# Patient Record
Sex: Female | Born: 1960
Health system: Southern US, Community
[De-identification: ages and names within clinical notes are randomized; demographics above are authoritative.]

## PROBLEM LIST (undated history)

## (undated) ENCOUNTER — Ambulatory Visit: Admission: EM | Payer: 59

## (undated) DIAGNOSIS — M62838 Other muscle spasm: Secondary | ICD-10-CM

## (undated) DIAGNOSIS — I890 Lymphedema, not elsewhere classified: Secondary | ICD-10-CM

## (undated) DIAGNOSIS — R519 Headache, unspecified: Secondary | ICD-10-CM

## (undated) DIAGNOSIS — K219 Gastro-esophageal reflux disease without esophagitis: Secondary | ICD-10-CM

## (undated) DIAGNOSIS — G43909 Migraine, unspecified, not intractable, without status migrainosus: Secondary | ICD-10-CM

## (undated) DIAGNOSIS — I10 Essential (primary) hypertension: Secondary | ICD-10-CM

## (undated) DIAGNOSIS — Z8616 Personal history of COVID-19: Secondary | ICD-10-CM

## (undated) DIAGNOSIS — C801 Malignant (primary) neoplasm, unspecified: Secondary | ICD-10-CM

## (undated) DIAGNOSIS — R109 Unspecified abdominal pain: Secondary | ICD-10-CM

## (undated) DIAGNOSIS — IMO0002 Reserved for concepts with insufficient information to code with codable children: Secondary | ICD-10-CM

## (undated) DIAGNOSIS — Z923 Personal history of irradiation: Secondary | ICD-10-CM

## (undated) DIAGNOSIS — F419 Anxiety disorder, unspecified: Secondary | ICD-10-CM

## (undated) DIAGNOSIS — R51 Headache: Secondary | ICD-10-CM

## (undated) DIAGNOSIS — M797 Fibromyalgia: Secondary | ICD-10-CM

## (undated) DIAGNOSIS — G8929 Other chronic pain: Secondary | ICD-10-CM

## (undated) DIAGNOSIS — M1990 Unspecified osteoarthritis, unspecified site: Secondary | ICD-10-CM

## (undated) DIAGNOSIS — M940 Chondrocostal junction syndrome [Tietze]: Secondary | ICD-10-CM

## (undated) DIAGNOSIS — R5383 Other fatigue: Secondary | ICD-10-CM

## (undated) DIAGNOSIS — G4730 Sleep apnea, unspecified: Secondary | ICD-10-CM

## (undated) HISTORY — DX: Migraine, unspecified, not intractable, without status migrainosus: G43.909

## (undated) HISTORY — DX: Other fatigue: R53.83

## (undated) HISTORY — DX: Other chronic pain: G89.29

## (undated) HISTORY — DX: Personal history of irradiation: Z92.3

## (undated) HISTORY — DX: Fibromyalgia: M79.7

## (undated) HISTORY — PX: BREAST LUMPECTOMY: SHX2

## (undated) HISTORY — DX: Essential (primary) hypertension: I10

## (undated) HISTORY — DX: Gastro-esophageal reflux disease without esophagitis: K21.9

## (undated) HISTORY — DX: Headache: R51

## (undated) HISTORY — DX: Headache, unspecified: R51.9

## (undated) HISTORY — PX: KNEE ARTHROSCOPY: SUR90

## (undated) HISTORY — DX: Personal history of COVID-19: Z86.16

## (undated) HISTORY — DX: Anxiety disorder, unspecified: F41.9

## (undated) HISTORY — DX: Unspecified abdominal pain: R10.9

## (undated) HISTORY — DX: Reserved for concepts with insufficient information to code with codable children: IMO0002

## (undated) HISTORY — PX: ABDOMINAL HYSTERECTOMY: SHX81

## (undated) HISTORY — DX: Other muscle spasm: M62.838

---

## 1997-08-12 ENCOUNTER — Other Ambulatory Visit: Admission: RE | Admit: 1997-08-12 | Discharge: 1997-08-12 | Payer: Self-pay | Admitting: Internal Medicine

## 1999-07-07 ENCOUNTER — Other Ambulatory Visit: Admission: RE | Admit: 1999-07-07 | Discharge: 1999-07-07 | Payer: Self-pay | Admitting: Internal Medicine

## 1999-07-26 ENCOUNTER — Ambulatory Visit (HOSPITAL_COMMUNITY): Admission: RE | Admit: 1999-07-26 | Discharge: 1999-07-26 | Payer: Self-pay | Admitting: Internal Medicine

## 1999-07-26 ENCOUNTER — Encounter: Payer: Self-pay | Admitting: Internal Medicine

## 2000-01-03 ENCOUNTER — Encounter: Payer: Self-pay | Admitting: Internal Medicine

## 2000-01-03 ENCOUNTER — Ambulatory Visit (HOSPITAL_COMMUNITY): Admission: RE | Admit: 2000-01-03 | Discharge: 2000-01-03 | Payer: Self-pay | Admitting: Internal Medicine

## 2000-01-24 ENCOUNTER — Encounter: Admission: RE | Admit: 2000-01-24 | Discharge: 2000-04-23 | Payer: Self-pay | Admitting: Internal Medicine

## 2000-03-12 ENCOUNTER — Other Ambulatory Visit: Admission: RE | Admit: 2000-03-12 | Discharge: 2000-03-12 | Payer: Self-pay | Admitting: Internal Medicine

## 2000-06-11 ENCOUNTER — Emergency Department (HOSPITAL_COMMUNITY): Admission: EM | Admit: 2000-06-11 | Discharge: 2000-06-11 | Payer: Self-pay | Admitting: Emergency Medicine

## 2001-05-09 ENCOUNTER — Other Ambulatory Visit: Admission: RE | Admit: 2001-05-09 | Discharge: 2001-05-09 | Payer: Self-pay | Admitting: Internal Medicine

## 2001-10-16 ENCOUNTER — Ambulatory Visit (HOSPITAL_COMMUNITY): Admission: RE | Admit: 2001-10-16 | Discharge: 2001-10-16 | Payer: Self-pay | Admitting: Internal Medicine

## 2001-10-16 ENCOUNTER — Encounter: Payer: Self-pay | Admitting: Internal Medicine

## 2001-10-30 ENCOUNTER — Encounter: Payer: Self-pay | Admitting: Internal Medicine

## 2001-10-30 ENCOUNTER — Encounter: Admission: RE | Admit: 2001-10-30 | Discharge: 2001-10-30 | Payer: Self-pay | Admitting: Internal Medicine

## 2002-03-18 ENCOUNTER — Encounter: Payer: Self-pay | Admitting: Obstetrics

## 2002-03-18 ENCOUNTER — Ambulatory Visit (HOSPITAL_COMMUNITY): Admission: RE | Admit: 2002-03-18 | Discharge: 2002-03-18 | Payer: Self-pay | Admitting: Obstetrics

## 2002-06-05 ENCOUNTER — Encounter (INDEPENDENT_AMBULATORY_CARE_PROVIDER_SITE_OTHER): Payer: Self-pay | Admitting: Specialist

## 2002-06-05 ENCOUNTER — Encounter (INDEPENDENT_AMBULATORY_CARE_PROVIDER_SITE_OTHER): Payer: Self-pay

## 2002-06-05 ENCOUNTER — Inpatient Hospital Stay (HOSPITAL_COMMUNITY): Admission: RE | Admit: 2002-06-05 | Discharge: 2002-06-11 | Payer: Self-pay | Admitting: Obstetrics & Gynecology

## 2002-06-08 ENCOUNTER — Encounter: Payer: Self-pay | Admitting: Obstetrics

## 2003-12-31 ENCOUNTER — Ambulatory Visit (HOSPITAL_COMMUNITY): Admission: RE | Admit: 2003-12-31 | Discharge: 2003-12-31 | Payer: Self-pay | Admitting: Internal Medicine

## 2005-03-25 ENCOUNTER — Emergency Department (HOSPITAL_COMMUNITY): Admission: EM | Admit: 2005-03-25 | Discharge: 2005-03-25 | Payer: Self-pay | Admitting: *Deleted

## 2005-09-13 ENCOUNTER — Ambulatory Visit (HOSPITAL_COMMUNITY): Admission: RE | Admit: 2005-09-13 | Discharge: 2005-09-13 | Payer: Self-pay | Admitting: Internal Medicine

## 2007-03-12 ENCOUNTER — Ambulatory Visit (HOSPITAL_COMMUNITY): Admission: RE | Admit: 2007-03-12 | Discharge: 2007-03-12 | Payer: Self-pay | Admitting: Internal Medicine

## 2007-03-28 ENCOUNTER — Emergency Department (HOSPITAL_COMMUNITY): Admission: EM | Admit: 2007-03-28 | Discharge: 2007-03-28 | Payer: Self-pay | Admitting: Emergency Medicine

## 2007-05-02 ENCOUNTER — Encounter: Admission: RE | Admit: 2007-05-02 | Discharge: 2007-05-02 | Payer: Self-pay | Admitting: Internal Medicine

## 2007-05-27 ENCOUNTER — Encounter: Admission: RE | Admit: 2007-05-27 | Discharge: 2007-05-27 | Payer: Self-pay | Admitting: Internal Medicine

## 2007-05-27 ENCOUNTER — Encounter (INDEPENDENT_AMBULATORY_CARE_PROVIDER_SITE_OTHER): Payer: Self-pay | Admitting: Radiology

## 2007-06-04 ENCOUNTER — Encounter: Admission: RE | Admit: 2007-06-04 | Discharge: 2007-06-04 | Payer: Self-pay | Admitting: Internal Medicine

## 2007-06-20 ENCOUNTER — Ambulatory Visit (HOSPITAL_BASED_OUTPATIENT_CLINIC_OR_DEPARTMENT_OTHER): Admission: RE | Admit: 2007-06-20 | Discharge: 2007-06-20 | Payer: Self-pay | Admitting: General Surgery

## 2007-06-20 ENCOUNTER — Encounter: Admission: RE | Admit: 2007-06-20 | Discharge: 2007-06-20 | Payer: Self-pay | Admitting: General Surgery

## 2007-06-20 ENCOUNTER — Encounter (INDEPENDENT_AMBULATORY_CARE_PROVIDER_SITE_OTHER): Payer: Self-pay | Admitting: General Surgery

## 2007-06-24 ENCOUNTER — Ambulatory Visit: Payer: Self-pay | Admitting: Oncology

## 2007-06-27 ENCOUNTER — Ambulatory Visit: Payer: Self-pay | Admitting: Oncology

## 2007-07-09 LAB — COMPREHENSIVE METABOLIC PANEL
ALT: 13 U/L (ref 0–35)
AST: 18 U/L (ref 0–37)
Albumin: 4 g/dL (ref 3.5–5.2)
Alkaline Phosphatase: 65 U/L (ref 39–117)
BUN: 9 mg/dL (ref 6–23)
CO2: 24 mEq/L (ref 19–32)
Calcium: 8.7 mg/dL (ref 8.4–10.5)
Chloride: 106 mEq/L (ref 96–112)
Creatinine, Ser: 0.67 mg/dL (ref 0.40–1.20)
Glucose, Bld: 96 mg/dL (ref 70–99)
Potassium: 4.2 mEq/L (ref 3.5–5.3)
Sodium: 140 mEq/L (ref 135–145)
Total Bilirubin: 0.3 mg/dL (ref 0.3–1.2)
Total Protein: 7.1 g/dL (ref 6.0–8.3)

## 2007-07-09 LAB — CBC WITH DIFFERENTIAL/PLATELET
BASO%: 0.4 % (ref 0.0–2.0)
Basophils Absolute: 0 10*3/uL (ref 0.0–0.1)
EOS%: 0.9 % (ref 0.0–7.0)
Eosinophils Absolute: 0.1 10*3/uL (ref 0.0–0.5)
HCT: 40.5 % (ref 34.8–46.6)
HGB: 13.5 g/dL (ref 11.6–15.9)
LYMPH%: 25.5 % (ref 14.0–48.0)
MCH: 28.2 pg (ref 26.0–34.0)
MCHC: 33.4 g/dL (ref 32.0–36.0)
MCV: 84.5 fL (ref 81.0–101.0)
MONO#: 0.5 10*3/uL (ref 0.1–0.9)
MONO%: 5.5 % (ref 0.0–13.0)
NEUT#: 6.4 10*3/uL (ref 1.5–6.5)
NEUT%: 67.7 % (ref 39.6–76.8)
Platelets: 296 10*3/uL (ref 145–400)
RBC: 4.8 10*6/uL (ref 3.70–5.32)
RDW: 13 % (ref 11.3–14.5)
WBC: 9.4 10*3/uL (ref 3.9–10.0)
lymph#: 2.4 10*3/uL (ref 0.9–3.3)

## 2007-07-09 LAB — LACTATE DEHYDROGENASE: LDH: 153 U/L (ref 94–250)

## 2007-07-09 LAB — FOLLICLE STIMULATING HORMONE: FSH: 7.6 m[IU]/mL

## 2007-07-09 LAB — CANCER ANTIGEN 27.29: CA 27.29: 19 U/mL (ref 0–39)

## 2007-07-09 LAB — LUTEINIZING HORMONE: LH: 2.8 m[IU]/mL

## 2007-07-10 LAB — VITAMIN D 25 HYDROXY (VIT D DEFICIENCY, FRACTURES): Vit D, 25-Hydroxy: 8 ng/mL — ABNORMAL LOW (ref 30–89)

## 2007-07-14 LAB — VITAMIN D 1,25 DIHYDROXY: Vit D, 1,25-Dihydroxy: 34 pg/mL (ref 6–62)

## 2007-07-15 ENCOUNTER — Encounter: Admission: RE | Admit: 2007-07-15 | Discharge: 2007-07-15 | Payer: Self-pay | Admitting: Oncology

## 2007-07-21 ENCOUNTER — Other Ambulatory Visit (INDEPENDENT_AMBULATORY_CARE_PROVIDER_SITE_OTHER): Payer: Self-pay | Admitting: General Surgery

## 2007-07-21 ENCOUNTER — Ambulatory Visit (HOSPITAL_BASED_OUTPATIENT_CLINIC_OR_DEPARTMENT_OTHER): Admission: RE | Admit: 2007-07-21 | Discharge: 2007-07-21 | Payer: Self-pay | Admitting: General Surgery

## 2007-07-21 ENCOUNTER — Encounter (INDEPENDENT_AMBULATORY_CARE_PROVIDER_SITE_OTHER): Payer: Self-pay | Admitting: General Surgery

## 2007-07-25 ENCOUNTER — Ambulatory Visit: Admission: RE | Admit: 2007-07-25 | Discharge: 2007-10-22 | Payer: Self-pay | Admitting: Radiation Oncology

## 2007-07-30 LAB — ESTRADIOL, ULTRA SENS: Estradiol, Ultra Sensitive: 47 pg/mL

## 2007-10-16 ENCOUNTER — Ambulatory Visit: Payer: Self-pay | Admitting: Oncology

## 2007-11-19 ENCOUNTER — Ambulatory Visit: Admission: RE | Admit: 2007-11-19 | Discharge: 2007-11-19 | Payer: Self-pay | Admitting: Radiation Oncology

## 2007-12-19 ENCOUNTER — Ambulatory Visit: Payer: Self-pay | Admitting: Oncology

## 2007-12-24 LAB — COMPREHENSIVE METABOLIC PANEL
ALT: 11 U/L (ref 0–35)
AST: 12 U/L (ref 0–37)
Albumin: 3.8 g/dL (ref 3.5–5.2)
Alkaline Phosphatase: 59 U/L (ref 39–117)
BUN: 15 mg/dL (ref 6–23)
CO2: 25 mEq/L (ref 19–32)
Calcium: 8.7 mg/dL (ref 8.4–10.5)
Chloride: 104 mEq/L (ref 96–112)
Creatinine, Ser: 0.98 mg/dL (ref 0.40–1.20)
Glucose, Bld: 99 mg/dL (ref 70–99)
Potassium: 3.7 mEq/L (ref 3.5–5.3)
Sodium: 136 mEq/L (ref 135–145)
Total Bilirubin: 0.3 mg/dL (ref 0.3–1.2)
Total Protein: 6.8 g/dL (ref 6.0–8.3)

## 2007-12-24 LAB — CBC WITH DIFFERENTIAL/PLATELET
BASO%: 0.4 % (ref 0.0–2.0)
Basophils Absolute: 0 10*3/uL (ref 0.0–0.1)
EOS%: 0.4 % (ref 0.0–7.0)
Eosinophils Absolute: 0 10*3/uL (ref 0.0–0.5)
HCT: 37.9 % (ref 34.8–46.6)
HGB: 12.9 g/dL (ref 11.6–15.9)
LYMPH%: 20.5 % (ref 14.0–48.0)
MCH: 29.1 pg (ref 26.0–34.0)
MCHC: 33.9 g/dL (ref 32.0–36.0)
MCV: 85.7 fL (ref 81.0–101.0)
MONO#: 0.5 10*3/uL (ref 0.1–0.9)
MONO%: 6.6 % (ref 0.0–13.0)
NEUT#: 5.8 10*3/uL (ref 1.5–6.5)
NEUT%: 72.1 % (ref 39.6–76.8)
Platelets: 249 10*3/uL (ref 145–400)
RBC: 4.42 10*6/uL (ref 3.70–5.32)
RDW: 12.9 % (ref 11.3–14.5)
WBC: 8 10*3/uL (ref 3.9–10.0)
lymph#: 1.6 10*3/uL (ref 0.9–3.3)

## 2007-12-24 LAB — CANCER ANTIGEN 27.29: CA 27.29: 16 U/mL (ref 0–39)

## 2008-03-15 ENCOUNTER — Encounter: Admission: RE | Admit: 2008-03-15 | Discharge: 2008-03-15 | Payer: Self-pay | Admitting: Internal Medicine

## 2008-04-23 DIAGNOSIS — Z923 Personal history of irradiation: Secondary | ICD-10-CM

## 2008-04-23 HISTORY — DX: Personal history of irradiation: Z92.3

## 2008-06-30 ENCOUNTER — Ambulatory Visit: Payer: Self-pay | Admitting: Oncology

## 2008-07-06 LAB — CBC WITH DIFFERENTIAL/PLATELET
BASO%: 0.4 % (ref 0.0–2.0)
Basophils Absolute: 0 10*3/uL (ref 0.0–0.1)
EOS%: 0.3 % (ref 0.0–7.0)
Eosinophils Absolute: 0 10*3/uL (ref 0.0–0.5)
HCT: 38.9 % (ref 34.8–46.6)
HGB: 13.1 g/dL (ref 11.6–15.9)
LYMPH%: 24.3 % (ref 14.0–49.7)
MCH: 29 pg (ref 25.1–34.0)
MCHC: 33.6 g/dL (ref 31.5–36.0)
MCV: 86.2 fL (ref 79.5–101.0)
MONO#: 0.3 10*3/uL (ref 0.1–0.9)
MONO%: 4.4 % (ref 0.0–14.0)
NEUT#: 5.5 10*3/uL (ref 1.5–6.5)
NEUT%: 70.6 % (ref 38.4–76.8)
Platelets: 226 10*3/uL (ref 145–400)
RBC: 4.51 10*6/uL (ref 3.70–5.45)
RDW: 13 % (ref 11.2–14.5)
WBC: 7.8 10*3/uL (ref 3.9–10.3)
lymph#: 1.9 10*3/uL (ref 0.9–3.3)

## 2008-07-06 LAB — COMPREHENSIVE METABOLIC PANEL
ALT: 17 U/L (ref 0–35)
AST: 20 U/L (ref 0–37)
Albumin: 3.6 g/dL (ref 3.5–5.2)
Alkaline Phosphatase: 47 U/L (ref 39–117)
BUN: 16 mg/dL (ref 6–23)
CO2: 26 mEq/L (ref 19–32)
Calcium: 8.4 mg/dL (ref 8.4–10.5)
Chloride: 107 mEq/L (ref 96–112)
Creatinine, Ser: 0.73 mg/dL (ref 0.40–1.20)
Glucose, Bld: 92 mg/dL (ref 70–99)
Potassium: 3.8 mEq/L (ref 3.5–5.3)
Sodium: 139 mEq/L (ref 135–145)
Total Bilirubin: 0.4 mg/dL (ref 0.3–1.2)
Total Protein: 6.7 g/dL (ref 6.0–8.3)

## 2008-07-06 LAB — LACTATE DEHYDROGENASE: LDH: 126 U/L (ref 94–250)

## 2008-07-07 LAB — CANCER ANTIGEN 27.29: CA 27.29: 15 U/mL (ref 0–39)

## 2008-07-07 LAB — VITAMIN D 25 HYDROXY (VIT D DEFICIENCY, FRACTURES): Vit D, 25-Hydroxy: 25 ng/mL — ABNORMAL LOW (ref 30–89)

## 2008-08-27 ENCOUNTER — Ambulatory Visit: Payer: Self-pay | Admitting: Oncology

## 2008-09-16 ENCOUNTER — Emergency Department (HOSPITAL_COMMUNITY): Admission: EM | Admit: 2008-09-16 | Discharge: 2008-09-16 | Payer: Self-pay | Admitting: Family Medicine

## 2008-10-26 ENCOUNTER — Ambulatory Visit: Payer: Self-pay | Admitting: Genetic Counselor

## 2008-12-03 ENCOUNTER — Ambulatory Visit: Payer: Self-pay | Admitting: Genetic Counselor

## 2008-12-30 ENCOUNTER — Ambulatory Visit: Payer: Self-pay | Admitting: Oncology

## 2009-03-28 ENCOUNTER — Ambulatory Visit: Payer: Self-pay | Admitting: Oncology

## 2009-04-20 ENCOUNTER — Encounter: Admission: RE | Admit: 2009-04-20 | Discharge: 2009-04-20 | Payer: Self-pay | Admitting: Oncology

## 2009-04-27 ENCOUNTER — Ambulatory Visit: Payer: Self-pay | Admitting: Oncology

## 2009-04-29 LAB — COMPREHENSIVE METABOLIC PANEL
ALT: 14 U/L (ref 0–35)
AST: 14 U/L (ref 0–37)
Albumin: 3.8 g/dL (ref 3.5–5.2)
Alkaline Phosphatase: 52 U/L (ref 39–117)
BUN: 16 mg/dL (ref 6–23)
CO2: 21 mEq/L (ref 19–32)
Calcium: 8.8 mg/dL (ref 8.4–10.5)
Chloride: 106 mEq/L (ref 96–112)
Creatinine, Ser: 0.66 mg/dL (ref 0.40–1.20)
Glucose, Bld: 99 mg/dL (ref 70–99)
Potassium: 4.1 mEq/L (ref 3.5–5.3)
Sodium: 139 mEq/L (ref 135–145)
Total Bilirubin: 0.2 mg/dL — ABNORMAL LOW (ref 0.3–1.2)
Total Protein: 6.6 g/dL (ref 6.0–8.3)

## 2009-04-29 LAB — CBC WITH DIFFERENTIAL/PLATELET
BASO%: 0.8 % (ref 0.0–2.0)
Basophils Absolute: 0.1 10*3/uL (ref 0.0–0.1)
EOS%: 0.7 % (ref 0.0–7.0)
Eosinophils Absolute: 0.1 10*3/uL (ref 0.0–0.5)
HCT: 38 % (ref 34.8–46.6)
HGB: 12.7 g/dL (ref 11.6–15.9)
LYMPH%: 24.8 % (ref 14.0–49.7)
MCH: 29 pg (ref 25.1–34.0)
MCHC: 33.3 g/dL (ref 31.5–36.0)
MCV: 87 fL (ref 79.5–101.0)
MONO#: 0.5 10*3/uL (ref 0.1–0.9)
MONO%: 6.1 % (ref 0.0–14.0)
NEUT#: 5.9 10*3/uL (ref 1.5–6.5)
NEUT%: 67.6 % (ref 38.4–76.8)
Platelets: 229 10*3/uL (ref 145–400)
RBC: 4.37 10*6/uL (ref 3.70–5.45)
RDW: 13.3 % (ref 11.2–14.5)
WBC: 8.7 10*3/uL (ref 3.9–10.3)
lymph#: 2.2 10*3/uL (ref 0.9–3.3)

## 2009-04-29 LAB — CANCER ANTIGEN 27.29: CA 27.29: 17 U/mL (ref 0–39)

## 2009-04-29 LAB — VITAMIN D 25 HYDROXY (VIT D DEFICIENCY, FRACTURES): Vit D, 25-Hydroxy: 35 ng/mL (ref 30–89)

## 2009-04-29 LAB — LACTATE DEHYDROGENASE: LDH: 150 U/L (ref 94–250)

## 2009-06-20 ENCOUNTER — Encounter: Admission: RE | Admit: 2009-06-20 | Discharge: 2009-06-20 | Payer: Self-pay | Admitting: General Surgery

## 2009-10-18 ENCOUNTER — Ambulatory Visit: Payer: Self-pay | Admitting: Oncology

## 2009-10-20 LAB — COMPREHENSIVE METABOLIC PANEL
ALT: 21 U/L (ref 0–35)
AST: 19 U/L (ref 0–37)
Albumin: 3.2 g/dL — ABNORMAL LOW (ref 3.5–5.2)
Alkaline Phosphatase: 52 U/L (ref 39–117)
BUN: 6 mg/dL (ref 6–23)
CO2: 26 mEq/L (ref 19–32)
Calcium: 8.3 mg/dL — ABNORMAL LOW (ref 8.4–10.5)
Chloride: 108 mEq/L (ref 96–112)
Creatinine, Ser: 0.67 mg/dL (ref 0.40–1.20)
Glucose, Bld: 101 mg/dL — ABNORMAL HIGH (ref 70–99)
Potassium: 3.4 mEq/L — ABNORMAL LOW (ref 3.5–5.3)
Sodium: 139 mEq/L (ref 135–145)
Total Bilirubin: 0.3 mg/dL (ref 0.3–1.2)
Total Protein: 6.1 g/dL (ref 6.0–8.3)

## 2009-10-20 LAB — CBC WITH DIFFERENTIAL/PLATELET
BASO%: 0.4 % (ref 0.0–2.0)
Basophils Absolute: 0 10*3/uL (ref 0.0–0.1)
EOS%: 0.7 % (ref 0.0–7.0)
Eosinophils Absolute: 0.1 10*3/uL (ref 0.0–0.5)
HCT: 39.8 % (ref 34.8–46.6)
HGB: 13.1 g/dL (ref 11.6–15.9)
LYMPH%: 26.9 % (ref 14.0–49.7)
MCH: 28.6 pg (ref 25.1–34.0)
MCHC: 32.8 g/dL (ref 31.5–36.0)
MCV: 87.2 fL (ref 79.5–101.0)
MONO#: 0.5 10*3/uL (ref 0.1–0.9)
MONO%: 6.3 % (ref 0.0–14.0)
NEUT#: 5 10*3/uL (ref 1.5–6.5)
NEUT%: 65.7 % (ref 38.4–76.8)
Platelets: 257 10*3/uL (ref 145–400)
RBC: 4.57 10*6/uL (ref 3.70–5.45)
RDW: 13.6 % (ref 11.2–14.5)
WBC: 7.6 10*3/uL (ref 3.9–10.3)
lymph#: 2 10*3/uL (ref 0.9–3.3)

## 2009-10-20 LAB — CANCER ANTIGEN 27.29: CA 27.29: 20 U/mL (ref 0–39)

## 2009-10-20 LAB — FOLLICLE STIMULATING HORMONE: FSH: 4.1 m[IU]/mL

## 2009-10-20 LAB — LACTATE DEHYDROGENASE: LDH: 137 U/L (ref 94–250)

## 2009-10-31 LAB — ESTRADIOL, ULTRA SENS: Estradiol, Ultra Sensitive: 217 pg/mL

## 2009-11-17 ENCOUNTER — Ambulatory Visit: Payer: Self-pay | Admitting: Oncology

## 2010-04-28 ENCOUNTER — Encounter
Admission: RE | Admit: 2010-04-28 | Discharge: 2010-04-28 | Payer: Self-pay | Source: Home / Self Care | Attending: Oncology | Admitting: Oncology

## 2010-05-01 ENCOUNTER — Encounter
Admission: RE | Admit: 2010-05-01 | Discharge: 2010-05-01 | Payer: Self-pay | Source: Home / Self Care | Attending: Oncology | Admitting: Oncology

## 2010-05-13 ENCOUNTER — Encounter: Payer: Self-pay | Admitting: Internal Medicine

## 2010-05-14 ENCOUNTER — Encounter: Payer: Self-pay | Admitting: Internal Medicine

## 2010-05-15 ENCOUNTER — Other Ambulatory Visit: Payer: Self-pay | Admitting: General Surgery

## 2010-05-15 DIAGNOSIS — N6459 Other signs and symptoms in breast: Secondary | ICD-10-CM

## 2010-05-23 ENCOUNTER — Encounter
Admission: RE | Admit: 2010-05-23 | Discharge: 2010-05-23 | Payer: Self-pay | Source: Home / Self Care | Attending: General Surgery | Admitting: General Surgery

## 2010-05-23 LAB — DIFFERENTIAL
Basophils Absolute: 0 10*3/uL (ref 0.0–0.1)
Basophils Relative: 0 % (ref 0–1)
Eosinophils Absolute: 0.1 10*3/uL (ref 0.0–0.7)
Eosinophils Relative: 1 % (ref 0–5)
Lymphocytes Relative: 34 % (ref 12–46)
Lymphs Abs: 2.5 10*3/uL (ref 0.7–4.0)
Monocytes Absolute: 0.5 10*3/uL (ref 0.1–1.0)
Monocytes Relative: 7 % (ref 3–12)
Neutro Abs: 4.3 10*3/uL (ref 1.7–7.7)
Neutrophils Relative %: 58 % (ref 43–77)

## 2010-05-23 LAB — URINALYSIS, ROUTINE W REFLEX MICROSCOPIC
Bilirubin Urine: NEGATIVE
Hgb urine dipstick: NEGATIVE
Ketones, ur: NEGATIVE mg/dL
Nitrite: NEGATIVE
Protein, ur: NEGATIVE mg/dL
Specific Gravity, Urine: 1.021 (ref 1.005–1.030)
Urine Glucose, Fasting: NEGATIVE mg/dL
Urobilinogen, UA: 1 mg/dL (ref 0.0–1.0)
pH: 6 (ref 5.0–8.0)

## 2010-05-23 LAB — COMPREHENSIVE METABOLIC PANEL
ALT: <8 U/L (ref 0–35)
AST: 18 U/L (ref 0–37)
Albumin: 3.2 g/dL — ABNORMAL LOW (ref 3.5–5.2)
Alkaline Phosphatase: 48 U/L (ref 39–117)
BUN: 7 mg/dL (ref 6–23)
CO2: 27 mEq/L (ref 19–32)
Calcium: 8.8 mg/dL (ref 8.4–10.5)
Chloride: 105 mEq/L (ref 96–112)
Creatinine, Ser: 0.74 mg/dL (ref 0.4–1.2)
GFR calc Af Amer: >60 mL/min (ref >60)
GFR calc non Af Amer: >60 mL/min (ref >60)
Glucose, Bld: 97 mg/dL (ref 70–99)
Potassium: 4.3 mEq/L (ref 3.5–5.1)
Sodium: 137 mEq/L (ref 135–145)
Total Bilirubin: 0.3 mg/dL (ref 0.3–1.2)
Total Protein: 6.7 g/dL (ref 6.0–8.3)

## 2010-05-23 LAB — CBC
HCT: 39 % (ref 36.0–46.0)
Hemoglobin: 12.6 g/dL (ref 12.0–15.0)
MCH: 27.8 pg (ref 26.0–34.0)
MCHC: 32.3 g/dL (ref 30.0–36.0)
MCV: 86.1 fL (ref 78.0–100.0)
Platelets: 272 10*3/uL (ref 150–400)
RBC: 4.53 MIL/uL (ref 3.87–5.11)
RDW: 12.9 % (ref 11.5–15.5)
WBC: 7.5 10*3/uL (ref 4.0–10.5)

## 2010-05-25 ENCOUNTER — Other Ambulatory Visit: Payer: Self-pay | Admitting: General Surgery

## 2010-05-25 DIAGNOSIS — R928 Other abnormal and inconclusive findings on diagnostic imaging of breast: Secondary | ICD-10-CM

## 2010-05-26 ENCOUNTER — Ambulatory Visit
Admission: RE | Admit: 2010-05-26 | Discharge: 2010-05-26 | Disposition: A | Discharge status: Home / Self Care | Payer: BC Managed Care – PPO | Source: Ambulatory Visit | Attending: General Surgery | Admitting: General Surgery

## 2010-05-26 ENCOUNTER — Other Ambulatory Visit: Payer: Self-pay | Admitting: General Surgery

## 2010-05-26 ENCOUNTER — Ambulatory Visit (HOSPITAL_BASED_OUTPATIENT_CLINIC_OR_DEPARTMENT_OTHER)
Admission: RE | Admit: 2010-05-26 | Discharge: 2010-05-26 | Disposition: A | Discharge status: Home / Self Care | Payer: BC Managed Care – PPO | Attending: General Surgery | Admitting: General Surgery

## 2010-05-26 DIAGNOSIS — N6459 Other signs and symptoms in breast: Secondary | ICD-10-CM

## 2010-05-26 DIAGNOSIS — N6089 Other benign mammary dysplasias of unspecified breast: Secondary | ICD-10-CM | POA: Insufficient documentation

## 2010-05-26 DIAGNOSIS — K219 Gastro-esophageal reflux disease without esophagitis: Secondary | ICD-10-CM | POA: Insufficient documentation

## 2010-05-26 DIAGNOSIS — G43909 Migraine, unspecified, not intractable, without status migrainosus: Secondary | ICD-10-CM | POA: Insufficient documentation

## 2010-05-26 DIAGNOSIS — R928 Other abnormal and inconclusive findings on diagnostic imaging of breast: Secondary | ICD-10-CM

## 2010-05-26 DIAGNOSIS — F17200 Nicotine dependence, unspecified, uncomplicated: Secondary | ICD-10-CM | POA: Insufficient documentation

## 2010-05-26 DIAGNOSIS — G4730 Sleep apnea, unspecified: Secondary | ICD-10-CM | POA: Insufficient documentation

## 2010-05-29 NOTE — Op Note (Signed)
Ashley Lindsey, Ashley Lindsey                ACCOUNT NO.:  [id number]  MEDICAL RECORD NO.:  [id number]          PATIENT TYPE:  AMB  LOCATION:  DSC                          FACILITY:  [facility]  PHYSICIAN:  Angelia Mould. Derrell Lolling, M.D.DATE OF BIRTH:  Oct 22, 1960  DATE OF PROCEDURE:  05/26/2010 DATE OF DISCHARGE:                              OPERATIVE REPORT   PREOPERATIVE DIAGNOSIS:  Atypical ductal hyperplasia, right breast, 6 o'clock position.  POSTOPERATIVE DIAGNOSIS:  Atypical ductal hyperplasia, right breast, 6 o'clock position.  OPERATION PERFORMED:  Right partial mastectomy with needle localization.  SURGEON:  Angelia Mould. Derrell Lolling, MD  OPERATIVE INDICATIONS:  This is a 50 year old African American female who has a history of left breast cancer.  She underwent left partial mastectomy in 2007 and left axillary sentinel node biopsy in 2009 for T1a, N0, IHC positive, receptor positive, Her-2 negative tumor.  She has no known local recurrence to the left breast.  She follows closely with Dr. Pierce Crane.  I last saw her 12 months ago.  She was on tamoxifen. Recent MRI in February 2011 looked fine.  Recent mammograms show what is thought to be a new area of microcalcifications at the 6 o'clock position of the right breast.  No other abnormalities were mentioned other the lumpectomy changes on the left.  Image-guided biopsy was performed showing atypical ductal hyperplasia with calcifications.  We have discussed this in the office.  She is aware that there could be sampling error and there might be ductal carcinoma in situ present.  She was advised to have this area completely excised to rule out occult carcinoma.  She is brought to the operating room electively.  OPERATIVE TECHNIQUE:  The patient underwent bracketed wire localization of a small area of calcifications in the 6 o'clock position of the right breast by Dr. Harmon Pier at the [facility] of [place].  The films looked good.  The  patient was taken to the operating room.  General anesthesia was induced.  The right breast was prepped and draped in a sterile fashion.  Intravenous antibiotics were given.  The patient was identified as the correct patient, correct procedure, and correct site.  I reviewed the imaging studies.  The wires appeared to be directed superolaterally and were entering about the 5 o'clock position.  I drew a very narrow radially-oriented incision at about the 5:30 position, which encompassed both wires.  This started just outside the areolar margin and stopped before we got to the inframammary crease.  After making this incision, dissection was carried down deeply into the breast tissue around these localizing wires.  The wires remained intact and were not seen as part of the dissection.  The specimen was removed and marked with the six-color margin marker kit.  This specimen mammogram was performed, Dr. Si Gaul stated that the calcifications were within the specimen.  They looked a little bit off to one side, but it could not be determined from the imaging studies which side that was, so we simply did no further dissection.  Specimen was sent to Pathology.  Hemostasis was excellent and achieved with electrocautery.  The breast  tissue was closed in several layers with interrupted sutures of 3-0 Vicryl and the skin closed with a running subcuticular suture of 4-0 Monocryl and Dermabond.  Ice pack was placed and the patient taken to the recovery room in a stable condition.  Estimated blood loss was about 10 mL.  Complications were none.  Sponge, needle, and instrument counts were correct.     Angelia Mould. Derrell Lolling, M.D.     HMI/MEDQ  D:  05/26/2010  T:  05/27/2010  Job:  [id number]  cc:   Pierce Crane, MD Merlene Laughter. Renae Gloss, M.D.  Electronically Signed by Claud Kelp M.D. on 05/29/2010 07:34:40 AM

## 2010-06-13 ENCOUNTER — Encounter (HOSPITAL_COMMUNITY)
Admission: RE | Admit: 2010-06-13 | Discharge: 2010-06-13 | Disposition: A | Discharge status: Home / Self Care | Payer: BC Managed Care – PPO | Source: Ambulatory Visit | Attending: General Surgery | Admitting: General Surgery

## 2010-06-13 DIAGNOSIS — Z01812 Encounter for preprocedural laboratory examination: Secondary | ICD-10-CM | POA: Insufficient documentation

## 2010-06-13 LAB — CBC
HCT: 38.9 % (ref 36.0–46.0)
Hemoglobin: 12.4 g/dL (ref 12.0–15.0)
MCH: 27 pg (ref 26.0–34.0)
MCHC: 31.9 g/dL (ref 30.0–36.0)
MCV: 84.6 fL (ref 78.0–100.0)
Platelets: 277 10*3/uL (ref 150–400)
RBC: 4.6 MIL/uL (ref 3.87–5.11)
RDW: 12.7 % (ref 11.5–15.5)
WBC: 7.9 10*3/uL (ref 4.0–10.5)

## 2010-06-13 LAB — BASIC METABOLIC PANEL
BUN: 8 mg/dL (ref 6–23)
CO2: 29 mEq/L (ref 19–32)
Calcium: 9.1 mg/dL (ref 8.4–10.5)
Chloride: 104 mEq/L (ref 96–112)
Creatinine, Ser: 0.75 mg/dL (ref 0.4–1.2)
GFR calc Af Amer: >60 mL/min (ref >60)
GFR calc non Af Amer: >60 mL/min (ref >60)
Glucose, Bld: 106 mg/dL — ABNORMAL HIGH (ref 70–99)
Potassium: 4.5 mEq/L (ref 3.5–5.1)
Sodium: 138 mEq/L (ref 135–145)

## 2010-06-13 LAB — SURGICAL PCR SCREEN
MRSA, PCR: NEGATIVE
Staphylococcus aureus: NEGATIVE

## 2010-06-16 ENCOUNTER — Other Ambulatory Visit: Payer: Self-pay | Admitting: General Surgery

## 2010-06-16 ENCOUNTER — Ambulatory Visit (HOSPITAL_COMMUNITY)
Admission: RE | Admit: 2010-06-16 | Discharge: 2010-06-16 | Disposition: A | Discharge status: Home / Self Care | Payer: BC Managed Care – PPO | Source: Ambulatory Visit | Attending: General Surgery | Admitting: General Surgery

## 2010-06-16 DIAGNOSIS — D0590 Unspecified type of carcinoma in situ of unspecified breast: Secondary | ICD-10-CM | POA: Insufficient documentation

## 2010-06-21 NOTE — Op Note (Signed)
NAMEMAEVYN, RIORDAN                ACCOUNT NO.:  [id number]  MEDICAL RECORD NO.:  [id number]           PATIENT TYPE:  O  LOCATION:  SDSC                         FACILITY:  [facility]  PHYSICIAN:  Angelia Mould. Derrell Lolling, M.D.DATE OF BIRTH:  1961/03/05  DATE OF PROCEDURE:  06/16/2010 DATE OF DISCHARGE:  06/16/2010                              OPERATIVE REPORT   PREOPERATIVE DIAGNOSIS:  Ductal carcinoma in situ, right breast, status post partial mastectomy with close superior margin.  POSTOPERATIVE DIAGNOSIS:  Ductal carcinoma in situ, right breast, status post partial mastectomy with close superior margin.  OPERATION PERFORMED:  Right partial mastectomy with re-excision of superior margin.  SURGEON:  Angelia Mould. Derrell Lolling, MD  OPERATIVE INDICATIONS:  This is a 50 year old African American female who has a history of left breast cancer in 2007, having undergone a left partial mastectomy and left axillary sentinel node biopsy for stage T1a, N0 (IHC positive), receptor positive, HER-2 negative tumor.  She did well following her adjuvant treatments.  She developed some microcalcifications in her right breast at the 6 o'clock position in the retroareolar area.  An image-guided biopsy showed atypical ductal hyperplasia.  A right partial mastectomy with bracketed needle localization was performed on May 26, 2010, and that showed ductal carcinoma in situ with a small tumor size but very close to the superior margin less than 1 mm from the superior margin.  I advised re-excision of this area.  She is brought to the operating room electively.  OPERATIVE TECHNIQUE:  Following the induction of a general LMA anesthetic, the patient's right breast was prepped and draped in a sterile fashion.  Intravenous antibiotics were given.  The patient was identified as the correct patient, correct procedure, and correct site. Marcaine 0.5% with epinephrine was used as a local infiltration anesthetic.  I  observed the radially oriented incision that was at about the 6:30 position.  It was healing uneventfully.  I made a very narrow ellipse around this incision to excise the old scar.  I then took the dissection laterally into the breast tissue.  I entered the seroma cavity at its most inferior part and left the inferior part of the cavity intact.  I then dissected around the seroma cavity superiorly, medially, and laterally, and somewhat posteriorly.  This went far back under the nipple-areolar complex but I was able to do this with good control of the tissues.  The specimen was removed and marked with the color margin marker kit.  The specimen was then sent to the lab with the appropriate history attached as a re-excision partial mastectomy.  The wound was irrigated with saline.  Hemostasis was excellent and achieved with electrocautery.  I rebuilt the breast tissues inferiorly by closing the breast tissues in two separate layers with interrupted sutures of 3-0 Vicryl.  The skin incision was then closed with a running subcuticular suture of 4-0 Monocryl and Dermabond.  Ice pack was placed and the patient taken to recovery room in stable condition.  Estimated blood loss was about 10-15 mL.  Complications none.  Sponge, needle, instrument counts were correct.     Mikey Bussing  Grayce Sessions, M.D.     HMI/MEDQ  D:  06/16/2010  T:  06/16/2010  Job:  [id number]  cc:   Pierce Crane, MD Billie Lade, M.D. Merlene Laughter. Renae Gloss, M.D.  Electronically Signed by Claud Kelp M.D. on 06/21/2010 01:18:19 PM

## 2010-06-23 ENCOUNTER — Other Ambulatory Visit: Payer: Self-pay | Admitting: Oncology

## 2010-06-23 ENCOUNTER — Encounter (HOSPITAL_BASED_OUTPATIENT_CLINIC_OR_DEPARTMENT_OTHER): Payer: Self-pay | Admitting: Oncology

## 2010-06-23 DIAGNOSIS — Z170 Estrogen receptor positive status [ER+]: Secondary | ICD-10-CM

## 2010-06-23 DIAGNOSIS — C50419 Malignant neoplasm of upper-outer quadrant of unspecified female breast: Secondary | ICD-10-CM

## 2010-06-23 LAB — CBC WITH DIFFERENTIAL/PLATELET
BASO%: 0.2 % (ref 0.0–2.0)
Basophils Absolute: 0 10*3/uL (ref 0.0–0.1)
EOS%: 1.8 % (ref 0.0–7.0)
Eosinophils Absolute: 0.1 10*3/uL (ref 0.0–0.5)
HCT: 37.4 % (ref 34.8–46.6)
HGB: 12.3 g/dL (ref 11.6–15.9)
LYMPH%: 43.1 % (ref 14.0–49.7)
MCH: 27.4 pg (ref 25.1–34.0)
MCHC: 32.7 g/dL (ref 31.5–36.0)
MCV: 83.8 fL (ref 79.5–101.0)
MONO#: 0.4 10*3/uL (ref 0.1–0.9)
MONO%: 7.7 % (ref 0.0–14.0)
NEUT#: 2.6 10*3/uL (ref 1.5–6.5)
NEUT%: 47.2 % (ref 38.4–76.8)
Platelets: 275 10*3/uL (ref 145–400)
RBC: 4.47 10*6/uL (ref 3.70–5.45)
RDW: 13.3 % (ref 11.2–14.5)
WBC: 5.6 10*3/uL (ref 3.9–10.3)
lymph#: 2.4 10*3/uL (ref 0.9–3.3)

## 2010-06-24 LAB — COMPREHENSIVE METABOLIC PANEL
ALT: 28 U/L (ref 0–35)
AST: 20 U/L (ref 0–37)
Albumin: 4 g/dL (ref 3.5–5.2)
Alkaline Phosphatase: 53 U/L (ref 39–117)
BUN: 16 mg/dL (ref 6–23)
CO2: 23 mEq/L (ref 19–32)
Calcium: 8.2 mg/dL — ABNORMAL LOW (ref 8.4–10.5)
Chloride: 106 mEq/L (ref 96–112)
Creatinine, Ser: 0.73 mg/dL (ref 0.40–1.20)
Glucose, Bld: 116 mg/dL — ABNORMAL HIGH (ref 70–99)
Potassium: 4 mEq/L (ref 3.5–5.3)
Sodium: 140 mEq/L (ref 135–145)
Total Bilirubin: 0.2 mg/dL — ABNORMAL LOW (ref 0.3–1.2)
Total Protein: 7.2 g/dL (ref 6.0–8.3)

## 2010-06-24 LAB — CANCER ANTIGEN 27.29: CA 27.29: 23 U/mL (ref 0–39)

## 2010-06-24 LAB — VITAMIN D 25 HYDROXY (VIT D DEFICIENCY, FRACTURES): Vit D, 25-Hydroxy: 55 ng/mL (ref 30–89)

## 2010-06-24 LAB — LACTATE DEHYDROGENASE: LDH: 154 U/L (ref 94–250)

## 2010-06-30 ENCOUNTER — Encounter: Payer: BC Managed Care – PPO | Admitting: Genetic Counselor

## 2010-07-24 ENCOUNTER — Ambulatory Visit: Payer: Self-pay | Attending: Radiation Oncology | Admitting: Radiation Oncology

## 2010-07-24 DIAGNOSIS — L538 Other specified erythematous conditions: Secondary | ICD-10-CM | POA: Insufficient documentation

## 2010-07-24 DIAGNOSIS — Z510 Encounter for antineoplastic radiation therapy: Secondary | ICD-10-CM | POA: Insufficient documentation

## 2010-07-24 DIAGNOSIS — C50419 Malignant neoplasm of upper-outer quadrant of unspecified female breast: Secondary | ICD-10-CM | POA: Insufficient documentation

## 2010-07-24 DIAGNOSIS — C50319 Malignant neoplasm of lower-inner quadrant of unspecified female breast: Secondary | ICD-10-CM | POA: Insufficient documentation

## 2010-07-27 ENCOUNTER — Other Ambulatory Visit: Payer: Self-pay | Admitting: Oncology

## 2010-07-27 ENCOUNTER — Encounter (HOSPITAL_BASED_OUTPATIENT_CLINIC_OR_DEPARTMENT_OTHER): Payer: Self-pay | Admitting: Oncology

## 2010-07-27 DIAGNOSIS — Z170 Estrogen receptor positive status [ER+]: Secondary | ICD-10-CM

## 2010-07-27 DIAGNOSIS — C50419 Malignant neoplasm of upper-outer quadrant of unspecified female breast: Secondary | ICD-10-CM

## 2010-07-27 DIAGNOSIS — Z853 Personal history of malignant neoplasm of breast: Secondary | ICD-10-CM

## 2010-07-27 LAB — CBC WITH DIFFERENTIAL/PLATELET
BASO%: 0.4 % (ref 0.0–2.0)
Basophils Absolute: 0 10*3/uL (ref 0.0–0.1)
EOS%: 0.9 % (ref 0.0–7.0)
Eosinophils Absolute: 0.1 10*3/uL (ref 0.0–0.5)
HCT: 39.4 % (ref 34.8–46.6)
HGB: 12.9 g/dL (ref 11.6–15.9)
LYMPH%: 30.3 % (ref 14.0–49.7)
MCH: 27.8 pg (ref 25.1–34.0)
MCHC: 32.6 g/dL (ref 31.5–36.0)
MCV: 85.3 fL (ref 79.5–101.0)
MONO#: 0.3 10*3/uL (ref 0.1–0.9)
MONO%: 5.3 % (ref 0.0–14.0)
NEUT#: 4.1 10*3/uL (ref 1.5–6.5)
NEUT%: 63.1 % (ref 38.4–76.8)
Platelets: 232 10*3/uL (ref 145–400)
RBC: 4.62 10*6/uL (ref 3.70–5.45)
RDW: 14.6 % — ABNORMAL HIGH (ref 11.2–14.5)
WBC: 6.6 10*3/uL (ref 3.9–10.3)
lymph#: 2 10*3/uL (ref 0.9–3.3)

## 2010-07-27 LAB — COMPREHENSIVE METABOLIC PANEL
ALT: <8 U/L (ref 0–35)
AST: 13 U/L (ref 0–37)
Albumin: 3.8 g/dL (ref 3.5–5.2)
Alkaline Phosphatase: 54 U/L (ref 39–117)
BUN: 12 mg/dL (ref 6–23)
CO2: 25 mEq/L (ref 19–32)
Calcium: 8.9 mg/dL (ref 8.4–10.5)
Chloride: 105 mEq/L (ref 96–112)
Creatinine, Ser: 0.84 mg/dL (ref 0.40–1.20)
Glucose, Bld: 105 mg/dL — ABNORMAL HIGH (ref 70–99)
Potassium: 4.4 mEq/L (ref 3.5–5.3)
Sodium: 138 mEq/L (ref 135–145)
Total Bilirubin: 0.2 mg/dL — ABNORMAL LOW (ref 0.3–1.2)
Total Protein: 6.8 g/dL (ref 6.0–8.3)

## 2010-08-01 LAB — POCT I-STAT, CHEM 8
BUN: 10 mg/dL (ref 6–23)
Calcium, Ion: 1.12 mmol/L (ref 1.12–1.32)
Chloride: 104 mEq/L (ref 96–112)
Creatinine, Ser: 0.8 mg/dL (ref 0.4–1.2)
Glucose, Bld: 95 mg/dL (ref 70–99)
HCT: 43 % (ref 36.0–46.0)
Hemoglobin: 14.6 g/dL (ref 12.0–15.0)
Potassium: 4.2 mEq/L (ref 3.5–5.1)
Sodium: 138 mEq/L (ref 135–145)
TCO2: 26 mmol/L (ref 0–100)

## 2010-08-07 ENCOUNTER — Encounter: Payer: Self-pay | Admitting: Genetic Counselor

## 2010-08-21 ENCOUNTER — Encounter: Payer: Self-pay | Admitting: Genetic Counselor

## 2010-09-05 NOTE — Op Note (Signed)
NAMEOLIVINE, Ashley Lindsey                ACCOUNT NO.:  [id number]   MEDICAL RECORD NO.:  [id number]          PATIENT TYPE:  AMB   LOCATION:  DSC                          FACILITY:  [facility]   PHYSICIAN:  Angelia Mould. Derrell Lolling, M.D.DATE OF BIRTH:  [birth year]   DATE OF PROCEDURE:  07/21/2007  DATE OF DISCHARGE:                               OPERATIVE REPORT   PREOPERATIVE DIAGNOSIS:  Invasive carcinoma left breast.   POSTOPERATIVE DIAGNOSIS:  Invasive carcinoma left breast.   OPERATION PERFORMED:  Left axillary sentinel lymph node biopsy,  injection of blue dye.   SURGEON:  Dr. Claud Kelp.   OPERATIVE INDICATIONS:  This is a 50 year old black female who was  recently evaluated because of some microcalcifications in the central  upper part of the left breast.  Core biopsy showed ductal carcinoma in  situ.  She underwent full evaluation including MRI and she was thought  to have localized ductal carcinoma in situ.  She underwent a localized  excision of this area with negative margins but there was a 4.5 mm focus  of invasive ductal carcinoma, tubular subtype identified within the  specimen.  I have discussed this with her.  The consensus view of Dr.  Donnie Coffin and I is that the standard of care would be to go ahead with a  left axillary sentinel node biopsy, recognizing that she is probably at  low risk for a lymph node metastasis due to the low grade nature of her  invasive cancer.  She is in full agreement with staging her axilla and  is brought to the operating room electively.   OPERATIVE TECHNIQUE:  The patient was brought to [facility] [employer].  The nuclear medicine technician injected  technetium sulfur colloid in the subareolar area.  The patient was taken  to the operating room.  The patient underwent a general LMA anesthetic.  Intravenous antibiotics were given.  Following an alcohol prep, I  injected 5 mL of blue dye in the subareolar area, this  consisted of 2 mL  of methylene blue mixed with 3 mL of saline.  The breast was massaged  for two or three minutes.  The left breast and left axilla were then  prepped and draped in a sterile fashion.  I used 0.5% Marcaine with  epinephrine as a local infiltration anesthetic.  Using the NeoProbe, we  identified the area of clearly increased activity in the left axilla.  A  transverse incision was made overlying this area which was lateral to  the lateral border of the pectoralis major muscle and just at the  hairline.  Dissection was carried down through the subcutaneous tissue  until we entered the axillary space.  We very quickly traced out a  single sentinel lymph node.  This was actually enlarged but palpably  soft.  It was very blue and very hot.  This was dissected away from the  surrounding tissues.  Dr. Berneta Levins performed imprint cytology.  She  called back to the operating room and told me that the imprint cytology  was negative  for cancer cells.  There were no other enlarged or hot or  blue lymph nodes apparent.  The wound was irrigated with saline.  Hemostasis was excellent.  The deep subcutaneous tissue was closed with  interrupted sutures of 3-0 Vicryl and skin closed with running  subcuticular suture of 4-0 Monocryl and Dermabond.  Clean bandages were  placed and the patient taken to the recovery room in stable condition.   ESTIMATED BLOOD LOSS:  About 10 mL or less.   COMPLICATIONS:  None.   Sponge, needle and instrument counts were correct.      Angelia Mould. Derrell Lolling, M.D.  Electronically Signed     HMI/MEDQ  D:  07/21/2007  T:  07/21/2007  Job:  [id number]   cc:   Merlene Laughter. Renae Gloss, M.D.  Pierce Crane, MD  Billie Lade, M.D.

## 2010-09-05 NOTE — Op Note (Signed)
NAMEVENNELA, Ashley Lindsey                ACCOUNT NO.:  [id number]   MEDICAL RECORD NO.:  [id number]          PATIENT TYPE:  AMB   LOCATION:  DSC                          FACILITY:  [facility]   PHYSICIAN:  Angelia Mould. Derrell Lolling, M.D.DATE OF BIRTH:  1961-04-02   DATE OF PROCEDURE:  06/20/2007  DATE OF DISCHARGE:                               OPERATIVE REPORT   PREOPERATIVE DIAGNOSIS:  Ductal carcinoma in situ, left breast.   POSTOPERATIVE DIAGNOSIS:  Ductal carcinoma in situ, left breast.   OPERATION PERFORMED:  Left partial mastectomy, with needle localization  and specimen mammogram.   SURGEON:  Angelia Mould. Derrell Lolling, M.D.   OPERATIVE INDICATIONS:  This is a 50 year old black female who had  routine screening mammograms.  Abnormal calcifications were noted in the  left breast anteriorly at the 12 o'clock position.  A stereotactic core  biopsy was performed by Dr. Anselmo Pickler, and a clip was placed.  The  pathology report showed a ductal carcinoma in situ.  She has had an MRI,  which showed this to be a solitary finding and was read as showing a 1.7  x 1.4 x 0.9 cm area of ductal carcinoma in situ at the 12 o'clock  position pf the left breast; with normal appearing lymph nodes.  The  patient was interested in breast conservation.  She is brought to the  operating room electively.   OPERATIVE TECHNIQUE:  The patient underwent needle localization at the  [facility] today, and that needle local wire was in  good position.  You could see the wire entering from the superior  location and directed posteriorly and slightly inferior, and going  through the calcifications and the marker clip.   The patient was then brought to the operating room.  General anesthesia  was induced.  The left breast was prepped and draped in a sterile  fashion.  The patient was identified as correct patient, correct  procedure and correct site.  I reviewed all the x-rays and put them up  in the operating  room for review.  Marcaine 0.5% with epinephrine was  used as a local infiltration anesthetic.   A curved transverse incision was made superiorly in the left breast,  about halfway between the areolar margin and the insertion site of the  wire.  Dissection was carried down into the breast, and with  electrocautery and sharp dissection I went widely around the localizing  wire.  The specimen was marked with the superficial, superior and  lateral margins.  Specimen mammogram was performed. and Dr. Kearney Hard called  and said that the marker clip was within the center of the specimen.  The Specimen was then sent for routine histology.  Hemostasis was  excellent and achieved with electrocautery.  The wound was irrigated  with saline.  I closed the deeper breast tissues with interrupted  sutures of 3-0 Vicryl.  I closed the superficial layers of breast tissue  with interrupted sutures of 3-0 Vicryl,  and I closed the skin with a  running subcuticular suture of 4-0 Monocryl and Dermabond.  Clean  bandages  were placed and the patient taken to recovery room in stable  condition.   ESTIMATED BLOOD LOSS:  About 20 mL or less.   COMPLICATIONS:  None.   COUNTS:  Sponge, needle and instrument counts were correct.      Angelia Mould. Derrell Lolling, M.D.  Electronically Signed     HMI/MEDQ  D:  06/20/2007  T:  06/21/2007  Job:  [id number]   cc:   Merlene Laughter. Renae Gloss, M.D.

## 2010-09-07 ENCOUNTER — Inpatient Hospital Stay: Admission: RE | Admit: 2010-09-07 | Payer: Self-pay | Source: Ambulatory Visit

## 2010-09-08 NOTE — Discharge Summary (Signed)
NAME:  Ashley Lindsey, Ashley Lindsey                          ACCOUNT NO.:  [id number]   MEDICAL RECORD NO.:  [id number]                   PATIENT TYPE:  INP   LOCATION:  9125                                 [facility]ACILITY[facility]   PHYSICIAN:  Roseanna Rainbow, M.D.         DATE OF BIRTH:  30-Oct-1960   DATE OF ADMISSION:  06/05/2002  DATE OF DISCHARGE:  06/11/2002                                 DISCHARGE SUMMARY   CHIEF COMPLAINT:  The patient is a 50 year old African-American female with  uterine fibroids and pelvic pain who presents for abdominal hysterectomy.   HISTORY OF PRESENT ILLNESS:  As above.  Previous work-up has included a  pelvic ultrasound that demonstrated multiple uterine fibroids and a  hydrosalpinx on the right.   ALLERGIES:  PENICILLIN with an anaphylactoid type reaction.   MEDICATIONS:  1. Fiorinal.  2. Ibuprofen.  3. Xanax.   SOCIAL HISTORY:  She denies any tobacco, ethanol, or substance abuse.   PAST MEDICAL HISTORY:  1. She has a history of migraines.  2. She has an anxiety disorder.   PAST OB/GYN HISTORY:  She is status post one NSVD.   PAST SURGICAL HISTORY:  She is status post right knee surgery.   PHYSICAL EXAMINATION:  VITAL SIGNS:  Stable, afebrile.  GENERAL:  Well-developed, well-nourished.  HEENT:  Normocephalic, atraumatic.  NECK:  Supple.  No thyromegaly.  LUNGS:  Clear to auscultation bilaterally.  HEART:  Regular rate and rhythm.  ABDOMEN:  Soft, nontender.  No organomegaly.  PELVIC:  On examination of the external female genitalia are normal  appearing.  On speculum examination the vagina is clean.  On bimanual  examination the uterus is anteverted, irregular in contour, approximately 12  weeks size, aggregate.  Adnexa nonpalpable, nontender bilaterally.  EXTREMITIES:  No clubbing, cyanosis, edema.   ASSESSMENT:  1. Uterine fibroids.  2. Right hydrosalpinx likely history of pelvic inflammatory disease.   PLAN:  Total abdominal hysterectomy  with right salpingectomy.   HOSPITAL COURSE:  The patient was admitted and underwent a total abdominal  hysterectomy, right salpingectomy, and left salpingo-oophorectomy with lysis  of adhesions.  Please see the dictated operative summary for further  details.  Her postoperative course was remarkable for anemia secondary to  blood loss of surgery with a hemoglobin of 8 on postoperative day number  three.  She also developed a mild ileus that responded to conservative  measures.  She was discharged to home on postoperative day number six  tolerating a regular diet.   DISCHARGE DIAGNOSES:  1. Uterine fibroids.  2. Endometriosis.   PROCEDURE:  1. Total abdominal hysterectomy.  2. Left salpingo-oophorectomy.  3. Right salpingectomy.  4. Lysis of adhesions.   CONDITION ON DISCHARGE:  Good.   DIET:  Regular.   ACTIVITY:  No strenuous activity.  No intercourse.   DISCHARGE MEDICATIONS:  1. Percocet one to two tablets p.o. q.i.d. p.r.n.  2. Ibuprofen 600 mg  one tablet p.o. q.i.d. p.r.n.  3. Niferex Forte one tablet p.o. b.i.d.  4. Colace one tablet p.o. b.i.d. p.r.n.   DISPOSITION:  The patient was to follow up in the office in one week for an  incision check.                                               Roseanna Rainbow, M.D.    Judee Clara  D:  06/11/2002  T:  06/11/2002  Job:  [id number]

## 2010-09-08 NOTE — Op Note (Signed)
NAME:  Ashley Lindsey, Ashley Lindsey                          ACCOUNT NO.:  [id number]   MEDICAL RECORD NO.:  [id number]                   PATIENT TYPE:  INP   LOCATION:  9125                                 FACILITY[facility]   PHYSICIAN:  Roseanna Rainbow, M.D.         DATE OF BIRTH:  [birth year]   DATE OF PROCEDURE:  06/05/2002  DATE OF DISCHARGE:                                 OPERATIVE REPORT   PREOPERATIVE DIAGNOSES:  1. Uterine fibroids.  2. Pelvic pain.  3. Left hydrosalpinx.   POSTOPERATIVE DIAGNOSES:  1. Uterine fibroids.  2. Pelvic pain.  3. Left hydrosalpinx.  4. Dense adnexal adhesions.   PROCEDURE:  1. Total abdominal hysterectomy.  2. Left salpingo-oophorectomy.  3. Right salpingectomy.  4. Lysis of adhesions.   SURGEON:  Roseanna Rainbow, M.D., Bing Neighbors. Clearance Coots, M.D.   ANESTHESIA:  General endotracheal.   COMPLICATIONS:  None.   ESTIMATED BLOOD LOSS:  1 L.   FLUIDS:  As per anesthesiology.   URINE OUTPUT:  As per anesthesiology.   OPERATIVE FINDINGS:  Diffusely enlarged uterus.  Both adnexa were encased in  dense adhesions to the peritoneum of the posterior cul-de-sac and the broad  ligaments.   PROCEDURE:  The risks, benefits, indications, and alternatives of the  procedure were reviewed with the patient and informed consent was obtained.  The patient was taken to the operating room with an IV running.  The patient  was placed in the dorsal lithotomy position, given general anesthesia, and  prepped and draped in the usual sterile fashion.  A Pfannenstiel incision  was then made approximately 2 cm above the symphysis pubis and extended to  the rectus fascia.  Fascia was then incised bilaterally with curved Mayo  scissors and the muscles of the anterior abdominal wall were separated in  the midline.  The parietoperitoneum was grasped between two pickups,  elevated, and entered sharply.  The pelvis was examined with the findings  noted above.  An  O'Connor-O-Sullivan retractor was placed into the incision  and the bowel packed away with moistened laparotomy sponges.  Two long Kelly  clamps were placed in the cornu for retraction.  The round ligaments on both  sides were transected with cautery.  The anterior leaf of the broad ligament  was incised along the bladder reflection to the midline from both sides.  The bladder was dissected off the lower uterine segment and cervix.  The  utero-ovarian ligaments and fallopian tubes on both sides were then doubly  clamped, transected, and suture ligated with 0 Vicryl.  Hemostasis was  visualized.  The uterine arteries were skeletonized bilaterally, clamped  with parametrial clamps, transected, and suture ligated with 0 Vicryl.  Again, hemostasis was assured.  At this point the uterus was amputated above  the uterine vessel pedicles.  The cervix was then amputated.  The  uterosacral ligaments were clamped on both sides, transected, and suture  ligated  in a similar fashion.  The cervix was then amputated.  The vaginal  cuff angles were closed with figure-of-eight sutures of 0 Vicryl.  The  remainder of the vaginal cuff was closed with a series of interrupted 0  Vicryl figure-of-eight sutures.  Hemostasis was assured.  At this point  attention was turned to the right adnexa.  The distal aspect of the tube as  well as the ovary were freed of their fibrous adhesions to the broad  ligament and posterior cul-de-sac.  The mesosalpinx was then clamped with  Kelly clamps and the right tube was excised.  The pedicles were secured with  suture ligatures of 2-0 Vicryl.  Excellent hemostasis was noted.  The  infundibulopelvic ligament on the right side was skeletonized and doubly  clamped and transected and suture ligated with 0 Vicryl.  The distal aspect  of the tube and ovary on that side were again dissected free of their  fibroid adhesions to the posterior cul-de-sac.  Hemostasis was assured.  The   pelvis was irrigated copiously with warm normal saline.  All laparotomy  sponges and instruments were removed from the abdomen.  The fascia was  closed with running 0 PDS and hemostasis was assured.  The skin was closed  with staples.  Sponge, lap, needle, and instrument counts were correct x2.  The patient was taken to the PACU awake and in stable condition.                                               Roseanna Rainbow, M.D.    Judee Clara  D:  06/08/2002  T:  06/08/2002  Job:  [id number]

## 2010-09-12 ENCOUNTER — Encounter (INDEPENDENT_AMBULATORY_CARE_PROVIDER_SITE_OTHER): Payer: Self-pay | Admitting: General Surgery

## 2010-11-06 ENCOUNTER — Encounter (INDEPENDENT_AMBULATORY_CARE_PROVIDER_SITE_OTHER): Payer: Self-pay | Admitting: General Surgery

## 2010-11-06 ENCOUNTER — Ambulatory Visit (INDEPENDENT_AMBULATORY_CARE_PROVIDER_SITE_OTHER): Payer: Medicaid Other | Admitting: General Surgery

## 2010-11-06 VITALS — BP 138/94 | HR 88 | Temp 96.4°F | Ht 62.5 in | Wt 213.0 lb

## 2010-11-06 DIAGNOSIS — C50911 Malignant neoplasm of unspecified site of right female breast: Secondary | ICD-10-CM

## 2010-11-06 DIAGNOSIS — C50919 Malignant neoplasm of unspecified site of unspecified female breast: Secondary | ICD-10-CM

## 2010-11-06 NOTE — Patient Instructions (Signed)
Try to exercise more. Continue regular followup with Dr. Andi Devon regarding your fatigue and possible depression. You will be due for bilateral mammograms in January of 2013. I will see you back in 6 months after you get her mammograms. Continue your followup with Dr. Beatris Ship and Dr. Antony Blackbird.

## 2010-11-06 NOTE — Progress Notes (Signed)
Subjective:     Patient ID: Ashley Lindsey, female   DOB: 03/12/1961, 50 y.o.   MRN: [id number]  HPI Ms. Gasparini is status post right partial mastectomy and sentinel biopsy and subsequent reexcision of superior margin in February 2012. Her final pathology report shows ductal carcinoma in situ.  She is taking tamoxifen and is tolerating that. She is following with Dr. Donnie Coffin.  She just completed her radiation therapy about a month ago. She says that she is extremely fatigued and actually had had a couple of near syncopal episodes. She has seen Dr. Andi Devon regarding her fatigue and syncopal episodes and possible depression.  She states that her right breast is although but more tender than she thought it would be at this point but she says the skin changes are getting better. She hasn't had any fever or chills.  Past Medical History  Diagnosis Date  . Abdominal cramps   . Muscle spasms of head and/or neck     following to the breast  . Fatigue   . Passed out     4th of july  . Migraines   . Chronic headaches    Current outpatient prescriptions:ALPRAZolam (XANAX PO), Take by mouth.  , Disp: , Rfl: ;  cyclobenzaprine (FLEXERIL) 10 MG tablet, Take 10 mg by mouth 3 (three) times daily as needed.  , Disp: , Rfl: ;  Multiple Vitamin (MULTIVITAMIN) tablet, Take 1 tablet by mouth daily.  , Disp: , Rfl: ;  TRAMADOL HCL, BIPHASIC, PO, Take by mouth.  , Disp: , Rfl:   Allergies  Allergen Reactions  . Penicillins    Family History  Problem Relation Age of Onset  . Cancer Mother   . Heart disease Father    History  Substance Use Topics  . Smoking status: Current Everyday Smoker -- 0.2 packs/day  . Smokeless tobacco: Not on file  . Alcohol Use: Yes     3 x a week  ROS  Review of Systems     Objective:   Physical Exam  Constitutional: She appears well-developed and well-nourished. No distress.  Neck: No JVD present. No tracheal deviation present.  Pulmonary/Chest: Breath  sounds normal. No respiratory distress. She has no wheezes. She has no rales. She exhibits no tenderness.    Lymphadenopathy:    She has no cervical adenopathy.  Psychiatric: Her behavior is normal. Thought content normal.       Depressed affect       Assessment:     Ductal carcinoma in situ, right breast, lower inner quadrant, uneventful  healing following partial mastectomy and sentinel lymph biopsy.  Recovering uneventfully following adjuvant radiation therapy.  History of left breast cancer, status post left partial mastectomy in 2007 with left axilla similar biopsy for stage TXVIII, N0, receptor positive, HER-2-negative tumor.  Fatigue and possible anxiety depression.    Plan:     Continued medical followup Dr. Andi Devon  Continue medical oncology followup with Dr. Pierce Crane.  She will be due for bilateral mammogram in January 2013, and I will see her at that time after she gets her mammograms.

## 2010-11-21 ENCOUNTER — Other Ambulatory Visit: Payer: Self-pay | Admitting: Oncology

## 2010-11-21 ENCOUNTER — Encounter (HOSPITAL_BASED_OUTPATIENT_CLINIC_OR_DEPARTMENT_OTHER): Payer: Self-pay | Admitting: Oncology

## 2010-11-21 DIAGNOSIS — C50319 Malignant neoplasm of lower-inner quadrant of unspecified female breast: Secondary | ICD-10-CM

## 2010-11-21 DIAGNOSIS — Z170 Estrogen receptor positive status [ER+]: Secondary | ICD-10-CM

## 2010-11-21 DIAGNOSIS — C50419 Malignant neoplasm of upper-outer quadrant of unspecified female breast: Secondary | ICD-10-CM

## 2010-11-21 DIAGNOSIS — Z853 Personal history of malignant neoplasm of breast: Secondary | ICD-10-CM

## 2010-11-21 DIAGNOSIS — Z9889 Other specified postprocedural states: Secondary | ICD-10-CM

## 2010-11-21 LAB — COMPREHENSIVE METABOLIC PANEL
ALT: 18 U/L (ref 0–35)
AST: 18 U/L (ref 0–37)
Albumin: 3.8 g/dL (ref 3.5–5.2)
Alkaline Phosphatase: 77 U/L (ref 39–117)
BUN: 13 mg/dL (ref 6–23)
CO2: 24 mEq/L (ref 19–32)
Calcium: 8.5 mg/dL (ref 8.4–10.5)
Chloride: 106 mEq/L (ref 96–112)
Creatinine, Ser: 0.67 mg/dL (ref 0.50–1.10)
Glucose, Bld: 99 mg/dL (ref 70–99)
Potassium: 4 mEq/L (ref 3.5–5.3)
Sodium: 139 mEq/L (ref 135–145)
Total Bilirubin: 0.2 mg/dL — ABNORMAL LOW (ref 0.3–1.2)
Total Protein: 6.4 g/dL (ref 6.0–8.3)

## 2010-11-21 LAB — FOLLICLE STIMULATING HORMONE: FSH: 11.5 m[IU]/mL

## 2010-11-21 LAB — CBC WITH DIFFERENTIAL/PLATELET
BASO%: 0.3 % (ref 0.0–2.0)
Basophils Absolute: 0 10*3/uL (ref 0.0–0.1)
EOS%: 1.3 % (ref 0.0–7.0)
Eosinophils Absolute: 0.1 10*3/uL (ref 0.0–0.5)
HCT: 38 % (ref 34.8–46.6)
HGB: 12.8 g/dL (ref 11.6–15.9)
LYMPH%: 20.4 % (ref 14.0–49.7)
MCH: 29.4 pg (ref 25.1–34.0)
MCHC: 33.6 g/dL (ref 31.5–36.0)
MCV: 87.3 fL (ref 79.5–101.0)
MONO#: 0.4 10*3/uL (ref 0.1–0.9)
MONO%: 6.6 % (ref 0.0–14.0)
NEUT#: 4.8 10*3/uL (ref 1.5–6.5)
NEUT%: 71.4 % (ref 38.4–76.8)
Platelets: 202 10*3/uL (ref 145–400)
RBC: 4.35 10*6/uL (ref 3.70–5.45)
RDW: 13.8 % (ref 11.2–14.5)
WBC: 6.7 10*3/uL (ref 3.9–10.3)
lymph#: 1.4 10*3/uL (ref 0.9–3.3)

## 2010-11-26 LAB — ESTRADIOL, ULTRA SENS: Estradiol, Ultra Sensitive: 111 pg/mL

## 2011-01-08 ENCOUNTER — Ambulatory Visit: Payer: Self-pay | Admitting: Radiation Oncology

## 2011-01-12 LAB — POCT HEMOGLOBIN-HEMACUE: Hemoglobin: 14.2

## 2011-01-15 LAB — POCT HEMOGLOBIN-HEMACUE: Hemoglobin: 13.3

## 2011-01-19 ENCOUNTER — Encounter (HOSPITAL_BASED_OUTPATIENT_CLINIC_OR_DEPARTMENT_OTHER): Payer: No Typology Code available for payment source | Admitting: Oncology

## 2011-01-19 ENCOUNTER — Other Ambulatory Visit: Payer: Self-pay | Admitting: Oncology

## 2011-01-19 DIAGNOSIS — C50419 Malignant neoplasm of upper-outer quadrant of unspecified female breast: Secondary | ICD-10-CM

## 2011-01-19 DIAGNOSIS — Z23 Encounter for immunization: Secondary | ICD-10-CM

## 2011-01-19 DIAGNOSIS — Z170 Estrogen receptor positive status [ER+]: Secondary | ICD-10-CM

## 2011-01-19 DIAGNOSIS — C50319 Malignant neoplasm of lower-inner quadrant of unspecified female breast: Secondary | ICD-10-CM

## 2011-01-19 LAB — CBC WITH DIFFERENTIAL/PLATELET
BASO%: 0.3 % (ref 0.0–2.0)
Basophils Absolute: 0 10*3/uL (ref 0.0–0.1)
EOS%: 1.1 % (ref 0.0–7.0)
Eosinophils Absolute: 0.1 10*3/uL (ref 0.0–0.5)
HCT: 41.6 % (ref 34.8–46.6)
HGB: 13.8 g/dL (ref 11.6–15.9)
LYMPH%: 24.6 % (ref 14.0–49.7)
MCH: 29.3 pg (ref 25.1–34.0)
MCHC: 33.3 g/dL (ref 31.5–36.0)
MCV: 88.2 fL (ref 79.5–101.0)
MONO#: 0.4 10*3/uL (ref 0.1–0.9)
MONO%: 6.2 % (ref 0.0–14.0)
NEUT#: 4.3 10*3/uL (ref 1.5–6.5)
NEUT%: 67.8 % (ref 38.4–76.8)
Platelets: 243 10*3/uL (ref 145–400)
RBC: 4.72 10*6/uL (ref 3.70–5.45)
RDW: 13.6 % (ref 11.2–14.5)
WBC: 6.3 10*3/uL (ref 3.9–10.3)
lymph#: 1.6 10*3/uL (ref 0.9–3.3)

## 2011-01-21 LAB — COMPREHENSIVE METABOLIC PANEL
ALT: 16 U/L (ref 0–35)
AST: 17 U/L (ref 0–37)
Albumin: 4.2 g/dL (ref 3.5–5.2)
Alkaline Phosphatase: 86 U/L (ref 39–117)
BUN: 11 mg/dL (ref 6–23)
CO2: 28 mEq/L (ref 19–32)
Calcium: 9.3 mg/dL (ref 8.4–10.5)
Chloride: 104 mEq/L (ref 96–112)
Creatinine, Ser: 0.78 mg/dL (ref 0.50–1.10)
Glucose, Bld: 85 mg/dL (ref 70–99)
Potassium: 4.2 mEq/L (ref 3.5–5.3)
Sodium: 140 mEq/L (ref 135–145)
Total Bilirubin: 0.2 mg/dL — ABNORMAL LOW (ref 0.3–1.2)
Total Protein: 7.7 g/dL (ref 6.0–8.3)

## 2011-02-13 ENCOUNTER — Ambulatory Visit
Admission: RE | Admit: 2011-02-13 | Discharge: 2011-02-13 | Disposition: A | Discharge status: Home / Self Care | Payer: Medicaid Other | Source: Ambulatory Visit | Attending: Radiation Oncology | Admitting: Radiation Oncology

## 2011-02-28 ENCOUNTER — Other Ambulatory Visit: Payer: Self-pay

## 2011-03-02 ENCOUNTER — Telehealth: Payer: Self-pay | Admitting: Radiation Oncology

## 2011-03-02 NOTE — Telephone Encounter (Signed)
[employer] Disability paperwork being sent to Dr. Roselind Messier for completion. Papers will be faxed to [phone] [place] [id number] [place] [id number]

## 2011-03-20 ENCOUNTER — Telehealth (INDEPENDENT_AMBULATORY_CARE_PROVIDER_SITE_OTHER): Payer: Self-pay

## 2011-03-20 NOTE — Telephone Encounter (Signed)
Pt called wanting to know if she had to have mgm that is scheduled for 03-23-11 at Dr Gita Kudo request. I advised pt of importance in having follow up imaging if her oncologist indicates it needs to be done. Pt states she still has some soreness of nipple but will try to go thru with mgm and keep her f/u appt with Dr Donnie Coffin in December.

## 2011-03-22 ENCOUNTER — Telehealth: Payer: Self-pay | Admitting: *Deleted

## 2011-03-22 ENCOUNTER — Other Ambulatory Visit: Payer: Self-pay

## 2011-03-22 NOTE — Telephone Encounter (Signed)
Confirmed over the phone the new date and time of the new appointment

## 2011-03-23 ENCOUNTER — Telehealth: Payer: Self-pay | Admitting: *Deleted

## 2011-03-23 ENCOUNTER — Ambulatory Visit
Admission: RE | Admit: 2011-03-23 | Discharge: 2011-03-23 | Disposition: A | Discharge status: Home / Self Care | Payer: No Typology Code available for payment source | Source: Ambulatory Visit | Attending: Oncology | Admitting: Oncology

## 2011-03-23 DIAGNOSIS — Z853 Personal history of malignant neoplasm of breast: Secondary | ICD-10-CM

## 2011-03-23 DIAGNOSIS — Z9889 Other specified postprocedural states: Secondary | ICD-10-CM

## 2011-03-23 NOTE — Telephone Encounter (Signed)
Pt called and stated that she has pain in both breasts of which Dr Donnie Coffin is aware.  She advised that she is currently waiting on her medicaid approval but it is still pending.  She is advising that she cannot tolerate the mammogram that is scheduled today and she will be cancelling this.  She is wondering if Dr Donnie Coffin will order her a mri of the breast since she cannot tolerate the mammogram.  RN advised patient that she will notify Dr Donnie Coffin and we will call her regarding his decision.

## 2011-03-23 NOTE — Telephone Encounter (Signed)
Made a note °

## 2011-04-13 ENCOUNTER — Other Ambulatory Visit: Payer: Self-pay | Admitting: Lab

## 2011-04-19 ENCOUNTER — Encounter: Payer: Self-pay | Admitting: Oncology

## 2011-04-20 ENCOUNTER — Ambulatory Visit: Payer: Self-pay | Admitting: Oncology

## 2011-04-23 ENCOUNTER — Telehealth (INDEPENDENT_AMBULATORY_CARE_PROVIDER_SITE_OTHER): Payer: Self-pay

## 2011-04-23 NOTE — Telephone Encounter (Signed)
I received msg to mail pt out of work dates from past surgery. Form complete and mailed to pts home address in chart.

## 2011-04-24 HISTORY — PX: OOPHORECTOMY: SHX86

## 2011-04-26 ENCOUNTER — Ambulatory Visit (HOSPITAL_BASED_OUTPATIENT_CLINIC_OR_DEPARTMENT_OTHER): Payer: No Typology Code available for payment source | Admitting: Oncology

## 2011-04-26 VITALS — BP 157/101 | HR 82 | Temp 97.7°F | Ht 62.3 in | Wt 232.8 lb

## 2011-04-26 DIAGNOSIS — E559 Vitamin D deficiency, unspecified: Secondary | ICD-10-CM

## 2011-04-26 DIAGNOSIS — M7989 Other specified soft tissue disorders: Secondary | ICD-10-CM

## 2011-04-26 DIAGNOSIS — C50919 Malignant neoplasm of unspecified site of unspecified female breast: Secondary | ICD-10-CM

## 2011-04-26 DIAGNOSIS — M2550 Pain in unspecified joint: Secondary | ICD-10-CM

## 2011-04-26 NOTE — Progress Notes (Signed)
Hematology and Oncology Follow Up Visit  Ashley Lindsey [id number] 01-May-1960 51 y.o. 04/26/2011 10:54 AM PCP dr Selena Batten [place]; dr Tamela Oddi  Principle Diagnosis: History of T1b tubular cancer associated DCIS, ER/PR positive, status post lumpectomy and sentinel lymph node evaluation 07/21/2007, status post completion of radiation therapy with bilateral breast cancers, right and left.      Interim History:  There have been no intercurrent illness, hospitalizations or medication changes. She has had more swelling of her extremeties, especially her rt hand , and her knees and lower extremeties. Medications: I have reviewed the patient's current medications.Dose of lyrica has been reduced.  Allergies:  Allergies  Allergen Reactions  . Penicillins     Past Medical History, Surgical history, Social history, and Family History were reviewed and updated.  Review of Systems: Constitutional:  Negative for fever, chills, night sweats, anorexia, weight loss, pain. Cardiovascular: negative Respiratory: no cough, shortness of breath, or wheezing Neurological: negative Dermatological: negative ENT: negative, have sinus congestion today Skin Gastrointestinal: no abdominal pain, change in bowel habits, or black or bloody stools Genito-Urinary: negative Hematological and Lymphatic: negative Breast: negative Musculoskeletal: negative Remaining ROS negative.  Physical Exam: Blood pressure 157/101, pulse 82, temperature 97.7 F (36.5 C), temperature source Oral, height 5' 2.3" (1.582 m), weight 232 lb 12.8 oz (105.597 kg). ECOG: 0 General appearance: alert, cooperative and appears stated age Head: Normocephalic, without obvious abnormality, atraumatic Neck: no adenopathy, no carotid bruit, no JVD, supple, symmetrical, trachea midline and thyroid not enlarged, symmetric, no tenderness/mass/nodules Lymph nodes: Cervical, supraclavicular, and axillary nodes normal. Cardiac : regular rate and  rhythm, no murmurs or gallops Pulmonary:clear to auscultation bilaterally and normal percussion bilaterally Breasts: inspection negative, no nipple discharge or bleeding, no masses or nodularity palpable Abdomen:soft, non-tender; bowel sounds normal; no masses,  no organomegaly Extremities negative, rt extremity is sl swollen Neuro: alert, oriented, normal speech, no focal findings or movement disorder noted  Lab Results: Lab Results  Component Value Date   WBC 6.3 01/19/2011   HGB 13.8 01/19/2011   HCT 41.6 01/19/2011   MCV 88.2 01/19/2011   PLT 243 01/19/2011     Chemistry      Component Value Date/Time   NA 140 01/19/2011 1431   K 4.2 01/19/2011 1431   CL 104 01/19/2011 1431   CO2 28 01/19/2011 1431   BUN 11 01/19/2011 1431   CREATININE 0.78 01/19/2011 1431      Component Value Date/Time   CALCIUM 9.3 01/19/2011 1431   ALKPHOS 86 01/19/2011 1431   AST 17 01/19/2011 1431   ALT 16 01/19/2011 1431   BILITOT 0.2* 01/19/2011 1431      .pathology. Radiological Studies: chest X-ray n/a Mammogram n/a Bone density n/a  Impression and Plan: Ms Mcnaught has diffuse arthralgia andswelling precluding work. She has joined the y, I havefrecommended the lymphedema clinic aswell. F/ 6 months  More than 50% of the visit was spent in patient-related counselling   Pierce Crane, MD 1/3/201310:54 AM

## 2011-05-01 ENCOUNTER — Telehealth: Payer: Self-pay

## 2011-05-01 ENCOUNTER — Telehealth (INDEPENDENT_AMBULATORY_CARE_PROVIDER_SITE_OTHER): Payer: Self-pay

## 2011-05-01 ENCOUNTER — Encounter: Payer: Self-pay | Admitting: *Deleted

## 2011-05-01 NOTE — Progress Notes (Signed)
Clinical Social work received referral from [employer], [employer] for financial resources.  CSW spoke with pt to asses for needs and/or concerns.  Pt stated she has used all of her [facility] funds, and is interested in other financial resources available.  CSW reviewed possible resources including [employer].  Pt agreed for CSW to make a referral to [employer] for transportation assistance and other financial resources.  CSW completed ACS referral, and plans to meet with pt later this week to review other available resources and applications.    Tamala Julian, LCSW

## 2011-05-01 NOTE — Telephone Encounter (Signed)
Received call from pt stating that she needs a more detailed letter about her medical condition and why she is fully disabled and can't return to work.  Pt states she needs this because she has to go to court in the process of applying her Medicaid, so that she can have surgery.  She needs this ASAP.  Her return number is [phone].  Note to MD.

## 2011-05-01 NOTE — Telephone Encounter (Signed)
Pt called wanting to know if the note she requested to be sent to her was sent to any other persons. Pt states she is applying for medicaid. I advised her that the only note was sent to her per her request but does become a part of the system because it will be scanned in. Pt states she has been under  Dr Donnie Coffin and Dr Mathews Robinsons care and is unable to return to work at this time.  I advised pt to f/u with Dr Mariea Stable Renae Gloss and the gyn that will be doing her ovary removal for her out of work status. Pt advised to keep long term f/u appts with our office and call if she has any concerns.

## 2011-05-08 ENCOUNTER — Ambulatory Visit: Payer: No Typology Code available for payment source | Admitting: Oncology

## 2011-05-10 ENCOUNTER — Encounter: Payer: Self-pay | Admitting: Oncology

## 2011-05-11 ENCOUNTER — Encounter (INDEPENDENT_AMBULATORY_CARE_PROVIDER_SITE_OTHER): Payer: Self-pay | Admitting: General Surgery

## 2011-05-21 ENCOUNTER — Ambulatory Visit: Payer: No Typology Code available for payment source | Admitting: Physical Therapy

## 2011-05-28 ENCOUNTER — Ambulatory Visit: Payer: No Typology Code available for payment source | Admitting: Physical Therapy

## 2011-06-05 ENCOUNTER — Other Ambulatory Visit: Payer: Self-pay | Admitting: Oncology

## 2011-06-05 DIAGNOSIS — C792 Secondary malignant neoplasm of skin: Secondary | ICD-10-CM

## 2011-06-05 DIAGNOSIS — C50919 Malignant neoplasm of unspecified site of unspecified female breast: Secondary | ICD-10-CM

## 2011-06-12 ENCOUNTER — Encounter (INDEPENDENT_AMBULATORY_CARE_PROVIDER_SITE_OTHER): Payer: Self-pay | Admitting: General Surgery

## 2011-06-13 ENCOUNTER — Telehealth: Payer: Self-pay | Admitting: *Deleted

## 2011-06-13 NOTE — Telephone Encounter (Signed)
spoke with April at the outpatient is she is going to call the patient with the new date and time

## 2011-06-26 ENCOUNTER — Encounter: Payer: Self-pay | Admitting: Oncology

## 2011-06-26 NOTE — Progress Notes (Signed)
Patient approve for 100% Discount  06/26/11 - 06/25/12.

## 2011-06-27 ENCOUNTER — Encounter: Payer: Self-pay | Admitting: Oncology

## 2011-06-28 ENCOUNTER — Other Ambulatory Visit: Payer: Self-pay | Admitting: Obstetrics & Gynecology

## 2011-06-28 ENCOUNTER — Telehealth: Payer: Self-pay

## 2011-06-28 DIAGNOSIS — R102 Pelvic and perineal pain: Secondary | ICD-10-CM

## 2011-06-28 NOTE — Telephone Encounter (Signed)
Received call from Erskine Squibb at Dr Annamaria Helling office stating that Dr Tamela Oddi would like to speak with Dr Donnie Coffin about pt's upcoming surgery.    Dr Marcia Brash cell [phone].   This information provided to Dr Donnie Coffin. dph

## 2011-06-29 ENCOUNTER — Other Ambulatory Visit: Payer: Self-pay | Admitting: Obstetrics & Gynecology

## 2011-07-03 ENCOUNTER — Telehealth: Payer: Self-pay | Admitting: *Deleted

## 2011-07-03 ENCOUNTER — Ambulatory Visit (HOSPITAL_COMMUNITY): Admission: RE | Admit: 2011-07-03 | Payer: Medicaid Other | Source: Ambulatory Visit

## 2011-07-03 NOTE — Telephone Encounter (Signed)
Pt. Called to say that her PCP referrred her to GYN:  Dr. Tamela Oddi and she is to have her ovary removed on April 18th at [facility]. She will need an appt. To see Dr. Donnie Coffin after that.   She also needs her lymphedema referral renewed as it ran out before she could get there. Will let Dr. Donnie Coffin know.

## 2011-07-06 ENCOUNTER — Encounter: Payer: Self-pay | Admitting: Oncology

## 2011-07-07 ENCOUNTER — Other Ambulatory Visit: Payer: Self-pay | Admitting: Oncology

## 2011-07-07 DIAGNOSIS — C50419 Malignant neoplasm of upper-outer quadrant of unspecified female breast: Secondary | ICD-10-CM

## 2011-07-09 ENCOUNTER — Ambulatory Visit (HOSPITAL_COMMUNITY): Payer: Medicaid Other

## 2011-07-10 ENCOUNTER — Telehealth: Payer: Self-pay | Admitting: *Deleted

## 2011-07-10 ENCOUNTER — Ambulatory Visit (HOSPITAL_BASED_OUTPATIENT_CLINIC_OR_DEPARTMENT_OTHER): Payer: Medicaid Other | Admitting: Oncology

## 2011-07-10 VITALS — BP 124/86 | HR 98 | Temp 97.9°F | Ht 62.5 in | Wt 228.6 lb

## 2011-07-10 DIAGNOSIS — Z853 Personal history of malignant neoplasm of breast: Secondary | ICD-10-CM

## 2011-07-10 DIAGNOSIS — C50419 Malignant neoplasm of upper-outer quadrant of unspecified female breast: Secondary | ICD-10-CM

## 2011-07-10 NOTE — Telephone Encounter (Signed)
gave patient appointment for 08-2011 printed out calendar and gave to the patient 

## 2011-07-11 ENCOUNTER — Telehealth: Payer: Self-pay | Admitting: *Deleted

## 2011-07-11 ENCOUNTER — Encounter: Payer: Self-pay | Admitting: Oncology

## 2011-07-11 NOTE — Progress Notes (Signed)
Patient came in yesterday for a doctor visit,she stop by my office to pick up some forms from [employer],then she also gave me her medicaid card,so I made a copy of it and then fax it over to the billing department,so that they could reverse the charges that.Medicaid has already paid on,she said they would go back as far as 04/12 to pay.

## 2011-07-11 NOTE — Telephone Encounter (Signed)
gave patient appointment for 08-2011 printed out calendar and gave to the patient 

## 2011-07-11 NOTE — Telephone Encounter (Signed)
Called patient to see how she was doing after yesterday's visit.   She states she is doing "much better."   Just wanted to let her know that we cared about her and to see if there was anything she needed.   She appreciated the phone call.

## 2011-07-13 ENCOUNTER — Telehealth: Payer: Self-pay | Admitting: Radiation Oncology

## 2011-07-13 NOTE — Telephone Encounter (Signed)
[employer] disability papers sent to Dr. Roselind Messier to complete.  08/22/2011: Status complete

## 2011-07-16 ENCOUNTER — Ambulatory Visit (HOSPITAL_COMMUNITY)
Admission: RE | Admit: 2011-07-16 | Discharge: 2011-07-16 | Disposition: A | Discharge status: Home / Self Care | Payer: Medicaid Other | Source: Ambulatory Visit | Attending: Obstetrics & Gynecology | Admitting: Obstetrics & Gynecology

## 2011-07-16 DIAGNOSIS — N949 Unspecified condition associated with female genital organs and menstrual cycle: Secondary | ICD-10-CM | POA: Insufficient documentation

## 2011-07-16 DIAGNOSIS — R1031 Right lower quadrant pain: Secondary | ICD-10-CM | POA: Insufficient documentation

## 2011-07-16 DIAGNOSIS — R102 Pelvic and perineal pain: Secondary | ICD-10-CM

## 2011-07-16 DIAGNOSIS — Z90710 Acquired absence of both cervix and uterus: Secondary | ICD-10-CM | POA: Insufficient documentation

## 2011-07-16 DIAGNOSIS — C50919 Malignant neoplasm of unspecified site of unspecified female breast: Secondary | ICD-10-CM | POA: Insufficient documentation

## 2011-07-16 DIAGNOSIS — N83209 Unspecified ovarian cyst, unspecified side: Secondary | ICD-10-CM | POA: Insufficient documentation

## 2011-07-29 ENCOUNTER — Other Ambulatory Visit: Payer: Self-pay | Admitting: Oncology

## 2011-07-30 ENCOUNTER — Ambulatory Visit: Payer: Medicaid Other | Admitting: Radiation Oncology

## 2011-08-07 ENCOUNTER — Encounter (HOSPITAL_COMMUNITY): Payer: Self-pay | Admitting: Pharmacy Technician

## 2011-08-08 ENCOUNTER — Other Ambulatory Visit: Payer: Self-pay | Admitting: *Deleted

## 2011-08-08 ENCOUNTER — Encounter (HOSPITAL_COMMUNITY): Payer: Self-pay

## 2011-08-08 ENCOUNTER — Encounter (HOSPITAL_COMMUNITY)
Admission: RE | Admit: 2011-08-08 | Discharge: 2011-08-08 | Disposition: A | Discharge status: Home / Self Care | Payer: Medicaid Other | Source: Ambulatory Visit | Attending: Obstetrics & Gynecology | Admitting: Obstetrics & Gynecology

## 2011-08-08 DIAGNOSIS — C50419 Malignant neoplasm of upper-outer quadrant of unspecified female breast: Secondary | ICD-10-CM

## 2011-08-08 HISTORY — DX: Unspecified osteoarthritis, unspecified site: M19.90

## 2011-08-08 HISTORY — DX: Sleep apnea, unspecified: G47.30

## 2011-08-08 HISTORY — DX: Lymphedema, not elsewhere classified: I89.0

## 2011-08-08 HISTORY — DX: Malignant (primary) neoplasm, unspecified: C80.1

## 2011-08-08 LAB — SURGICAL PCR SCREEN
MRSA, PCR: NEGATIVE
Staphylococcus aureus: NEGATIVE

## 2011-08-08 LAB — CBC
HCT: 40 % (ref 36.0–46.0)
Hemoglobin: 12.8 g/dL (ref 12.0–15.0)
MCH: 27.9 pg (ref 26.0–34.0)
MCHC: 32 g/dL (ref 30.0–36.0)
MCV: 87.1 fL (ref 78.0–100.0)
Platelets: 278 10*3/uL (ref 150–400)
RBC: 4.59 MIL/uL (ref 3.87–5.11)
RDW: 13.8 % (ref 11.5–15.5)
WBC: 7.5 10*3/uL (ref 4.0–10.5)

## 2011-08-08 MED ORDER — CYCLOBENZAPRINE HCL 10 MG PO TABS: 10.0000 mg | ORAL_TABLET | Freq: Three times a day (TID) | ORAL | Status: DC | PRN

## 2011-08-08 NOTE — Patient Instructions (Signed)
18 Ashley Lindsey  08/08/2011   Your procedure is scheduled on:  08/17/11  Friday    Surgery 1130-1430  Report to [facility] at   0900    AM.  Call this number if you have problems the morning of surgery: [phone]     Or PST   [id number]  [facility]   Remember:   Do not eat food:   Or drink any fluids After Midnight.   Thursday NIGHT    Clear liquids include soda, tea, black coffee, apple or grape juice, broth.  Take these medicines the morning of surgery with A SIP OF WATER: [name],TRAMADOL          Xanax or [name] if needed   Do not wear jewelry, make-up or nail polish.  Do not wear lotions, powders, or perfumes. You may wear deodorant.  Do not shave 48 hours prior to surgery.  Do not bring valuables to the hospital.  Contacts, dentures or bridgework may not be worn into surgery.  Leave suitcase in the car. After surgery it may be brought to your room.  For patients admitted to the hospital, checkout time is 11:00 AM the day of discharge.   Patients discharged the day of surgery will not be allowed to drive home.  Name and phone number of your driver:     daughter                                                                 Special Instructions: CHG Shower Use Special Wash: 1/2 bottle night before surgery and 1/2 bottle morning of surgery. REGULAR SOAP FACE AND PRIVATES              LADIES- NO SHAVING 48 HOURS BEFORE USING BETASEPT SOAP.              Please read over the following fact sheets that you were given: MRSA Information

## 2011-08-08 NOTE — Pre-Procedure Instructions (Signed)
Instructed to call Dr Lodema Pilot office regarding if she needs to stop antiinflammatories, aspirin or vitamins before surgery

## 2011-08-08 NOTE — Progress Notes (Signed)
08/08/11 1506  OBSTRUCTIVE SLEEP APNEA  Have you ever been diagnosed with sleep apnea through a sleep study? No  Do you snore loudly (loud enough to be heard through closed doors)?  1  Do you often feel tired, fatigued, or sleepy during the daytime? 1  Has anyone observed you stop breathing during your sleep? 1  Do you have, or are you being treated for high blood pressure? 0  BMI more than 35 kg/m2? 1  Age over 53 years old? 1  Neck circumference greater than 40 cm/18 inches? 0  Gender: 0  Obstructive Sleep Apnea Score 5   Score 4 or greater  Updated health history;Results sent to PCP

## 2011-08-08 NOTE — Pre-Procedure Instructions (Signed)
Faxed STOP BANG SCORE tool to Dr Renae Gloss with confirmation

## 2011-08-09 ENCOUNTER — Encounter (HOSPITAL_COMMUNITY): Payer: Self-pay

## 2011-08-13 ENCOUNTER — Telehealth: Payer: Self-pay | Admitting: *Deleted

## 2011-08-13 DIAGNOSIS — Z853 Personal history of malignant neoplasm of breast: Secondary | ICD-10-CM

## 2011-08-13 NOTE — Telephone Encounter (Signed)
DR.JACKSON-MOORE NEEDS TO SPEAK WITH DR.RUBIN CONCERNING PT.'S UPCOMING SURGERY. OOPHORECTOMY WAS NOT MENTIONED IN DR.RUBIN'S DICTATION. DR. Tamela Oddi WILL BE IN SURGERY TOMORROW WITH THE GYN ONCOLOGY. INFORMED JANE THAT DR.RUBIN IS OUT OF THE OFFICE TODAY. HE WILL RETURN TO THE OFFICE TOMORROW. THIS NOTE PLACED ON DR.RUBIN'S DESK.

## 2011-08-13 NOTE — H&P (Signed)
  Ashley Lindsey is an 51 y.o. female. H/O breast ca--now for ovarian ablation/oophorectomy.  HPI:  H/O B/L breast ca--ER/PR positive.  S/P hysterectomy/oophorectomy.  A recent U/S showed a normal right ovary.  Pertinent Gynecological History:  Previous GYN Procedures: see above       Past Medical History  Diagnosis Date  . Abdominal cramps   . Muscle spasms of head and/or neck     following to the breast  . Fatigue   . Passed out     4th of july  . Migraines   . Chronic headaches   . Arthritis   . Cancer     s/p radiation- last dose 6/12//has been off Tamoxifen 8/12  . Sleep apnea     STOP BANG SCORE 5  . Lymphedema     RIGHT ARM-////  STATES USE LEFT ARM FOR BP'S    Past Surgical History  Procedure Date  . Breast lumpectomy 2009/2012    LEFT/RIGHT with lymph node dissection  . Abdominal hysterectomy     with left salpingooophorectomy  . Knee arthroscopy     right    Family History  Problem Relation Age of Onset  . Cancer Mother   . Heart disease Father     Social History:  reports that she has been smoking Cigarettes.  She has a .25 pack-year smoking history. She does not have any smokeless tobacco history on file. She reports that she drinks alcohol. She reports that she does not use illicit drugs.  Allergies:  Allergies  Allergen Reactions  . Penicillins     No prescriptions prior to admission    ROS Non-contributory  Physical Exam General: WD/WN, NAD Chest: : Lungs CTA B/L Cor: RRR Abd:  No organomegaly, NT Pelvic:  EGBUS: wnl; vaginal mucosa normal; cervix absent; adnexae nonpalpable, NT   Assessment/Plan: Pt w/ h/o bilateral breast ca--ER/PR positive.  A robotic assisted RSO is planned. Risks, benefits reviewed including but not limited to injury to adjacent abdominal/pelvic viscera, bleeding, infection and thromboembolic complications.  JACKSON-MOORE,Bird Swetz A 08/13/2011, 4:28 PM

## 2011-08-14 NOTE — Progress Notes (Signed)
Hematology and Oncology Follow Up Visit  Ashley Lindsey [id number] 05/31/1960 51 y.o. 08/14/2011 3:58 PM PCP Dr Tamela Oddi  Principle Diagnosis: 51 year old woman with history of a T1 B. tubular cancer associated DCIS, ER/PR positive, status post lumpectomy and sentinel lymph node evaluation 07/21/2007, status post completion of radiation, on tamoxifen. Recent diagnosis of DCIS in the right breast status post lumpectomy and radiation therapy completed February 2012  Interim History:  There have been no intercurrent illness, hospitalizations or medication changes. She continues on multiple medications include Lyrica Ultram Toradol Naprosyn , fiorinal  all as well as Xanax. She is doing a bit better on low dose of Lyrica. She does be drawn tamoxifen fairly well. She's had no other intercurrent illnesses. She is to undergo oophorectomy of her solitary ovary next week.  Medications: I have reviewed the patient's current medications.  Allergies:  Allergies  Allergen Reactions  . Penicillins     Past Medical History, Surgical history, Social history, and Family History were reviewed and updated.  Review of Systems: Constitutional:  Negative for fever, chills, night sweats, anorexia, weight loss, pain. Cardiovascular: no chest pain or dyspnea on exertion Respiratory: no cough, shortness of breath, or wheezing Neurological: negative Dermatological: negative ENT: negative Skin Gastrointestinal: negative Genito-Urinary: negative Hematological and Lymphatic: negative Breast: negative Musculoskeletal: negative Remaining ROS negative.  Physical Exam: Blood pressure 124/86, pulse 98, temperature 97.9 F (36.6 C), temperature source Oral, height 5' 2.5" (1.588 m), weight 228 lb 9.6 oz (103.692 kg). ECOG:  General appearance: alert, cooperative and appears stated age Head: Normocephalic, without obvious abnormality, atraumatic Neck: no adenopathy, no carotid bruit, no JVD, supple,  symmetrical, trachea midline and thyroid not enlarged, symmetric, no tenderness/mass/nodules Lymph nodes: Cervical, supraclavicular, and axillary nodes normal. Cardiac : regular rate and rhythm, no murmurs or gallops Pulmonary:clear to auscultation bilaterally and normal percussion bilaterally Breasts: inspection negative, no nipple discharge or bleeding, no masses or nodularity palpable, she is status post bilateral lumpectomies, both scars have healed well. Abdomen:soft, non-tender; bowel sounds normal; no masses,  no organomegaly Extremities negative Neuro: alert, oriented, normal speech, no focal findings or movement disorder noted  Lab Results: Lab Results  Component Value Date   WBC 7.5 08/08/2011   HGB 12.8 08/08/2011   HCT 40.0 08/08/2011   MCV 87.1 08/08/2011   PLT 278 08/08/2011     Chemistry      Component Value Date/Time   NA 140 01/19/2011 1431   K 4.2 01/19/2011 1431   CL 104 01/19/2011 1431   CO2 28 01/19/2011 1431   BUN 11 01/19/2011 1431   CREATININE 0.78 01/19/2011 1431      Component Value Date/Time   CALCIUM 9.3 01/19/2011 1431   ALKPHOS 86 01/19/2011 1431   AST 17 01/19/2011 1431   ALT 16 01/19/2011 1431   BILITOT 0.2* 01/19/2011 1431      .pathology. Radiological Studies: chest X-ray n/a Mammogram n/a Bone density n/a  Impression and Plan: Patient is doing well. She has bilateral breast cancers. Essentially they desired then to get her on an AI he has she been on tamoxifen the past. Given that she has a pre-menopausal level of estradiol that she was ask ahead with a nephrectomy remaining ovary. This will take place next week we'll see her next week to discuss AI therapy.  More than 50% of the visit was spent in patient-related counselling   Pierce Crane, MD 4/23/20133:58 PM

## 2011-08-17 ENCOUNTER — Encounter (HOSPITAL_COMMUNITY): Payer: Self-pay | Admitting: Anesthesiology

## 2011-08-17 ENCOUNTER — Encounter (HOSPITAL_COMMUNITY): Payer: Self-pay | Admitting: *Deleted

## 2011-08-17 ENCOUNTER — Ambulatory Visit (HOSPITAL_COMMUNITY)
Admission: RE | Admit: 2011-08-17 | Discharge: 2011-08-19 | Disposition: A | Discharge status: Home / Self Care | Payer: Medicaid Other | Source: Ambulatory Visit | Attending: Obstetrics & Gynecology | Admitting: Obstetrics & Gynecology

## 2011-08-17 ENCOUNTER — Encounter (HOSPITAL_COMMUNITY)
Admission: RE | Disposition: A | Discharge status: Home / Self Care | Payer: Self-pay | Source: Ambulatory Visit | Attending: Obstetrics & Gynecology

## 2011-08-17 ENCOUNTER — Ambulatory Visit (HOSPITAL_COMMUNITY): Payer: Medicaid Other | Admitting: Anesthesiology

## 2011-08-17 DIAGNOSIS — Z4002 Encounter for prophylactic removal of ovary: Secondary | ICD-10-CM | POA: Insufficient documentation

## 2011-08-17 DIAGNOSIS — C50919 Malignant neoplasm of unspecified site of unspecified female breast: Secondary | ICD-10-CM | POA: Insufficient documentation

## 2011-08-17 DIAGNOSIS — Z170 Estrogen receptor positive status [ER+]: Secondary | ICD-10-CM | POA: Insufficient documentation

## 2011-08-17 DIAGNOSIS — IMO0002 Reserved for concepts with insufficient information to code with codable children: Secondary | ICD-10-CM

## 2011-08-17 DIAGNOSIS — I890 Lymphedema, not elsewhere classified: Secondary | ICD-10-CM | POA: Insufficient documentation

## 2011-08-17 DIAGNOSIS — Z01812 Encounter for preprocedural laboratory examination: Secondary | ICD-10-CM | POA: Insufficient documentation

## 2011-08-17 DIAGNOSIS — Z853 Personal history of malignant neoplasm of breast: Secondary | ICD-10-CM

## 2011-08-17 DIAGNOSIS — G4730 Sleep apnea, unspecified: Secondary | ICD-10-CM | POA: Insufficient documentation

## 2011-08-17 DIAGNOSIS — R110 Nausea: Secondary | ICD-10-CM | POA: Insufficient documentation

## 2011-08-17 LAB — TYPE AND SCREEN
ABO/RH(D): O POS
Antibody Screen: NEGATIVE

## 2011-08-17 SURGERY — ROBOTIC ASSISTED SALPINGO OOPHORECTOMY
Anesthesia: General | Site: Abdomen | Laterality: Right | Wound class: Clean

## 2011-08-17 MED ORDER — LIDOCAINE HCL (CARDIAC) 20 MG/ML IV SOLN
INTRAVENOUS | Status: DC | PRN
  Administered 2011-08-17: 50 mg via INTRAVENOUS

## 2011-08-17 MED ORDER — HYDROMORPHONE HCL PF 1 MG/ML IJ SOLN: 0.2500 mg | INTRAMUSCULAR | Status: DC | PRN

## 2011-08-17 MED ORDER — FENTANYL CITRATE 0.05 MG/ML IJ SOLN
INTRAMUSCULAR | Status: DC | PRN
  Administered 2011-08-17 (×4): 50 ug via INTRAVENOUS
  Administered 2011-08-17 (×3): 100 ug via INTRAVENOUS

## 2011-08-17 MED ORDER — MAGNESIUM OXIDE 400 MG PO TABS
400.0000 mg | ORAL_TABLET | Freq: Every day | ORAL | Status: DC
  Administered 2011-08-18 – 2011-08-19 (×2): 400 mg via ORAL
  Filled 2011-08-17 (×3): qty 1

## 2011-08-17 MED ORDER — GLYCOPYRROLATE 0.2 MG/ML IJ SOLN
INTRAMUSCULAR | Status: DC | PRN
  Administered 2011-08-17: .5 mg via INTRAVENOUS

## 2011-08-17 MED ORDER — MIDAZOLAM HCL 5 MG/5ML IJ SOLN
INTRAMUSCULAR | Status: DC | PRN
  Administered 2011-08-17: 2 mg via INTRAVENOUS

## 2011-08-17 MED ORDER — LACTATED RINGERS IV SOLN: INTRAVENOUS | Status: DC

## 2011-08-17 MED ORDER — BUPIVACAINE-EPINEPHRINE PF 0.25-1:200000 % IJ SOLN: INTRAMUSCULAR | Status: DC | PRN

## 2011-08-17 MED ORDER — NEOSTIGMINE METHYLSULFATE 1 MG/ML IJ SOLN
INTRAMUSCULAR | Status: DC | PRN
  Administered 2011-08-17: 5 mg via INTRAVENOUS

## 2011-08-17 MED ORDER — OXYCODONE-ACETAMINOPHEN 5-325 MG PO TABS
1.0000 | ORAL_TABLET | ORAL | Status: DC | PRN
  Administered 2011-08-17 – 2011-08-18 (×3): 1 via ORAL
  Administered 2011-08-18 (×2): 2 via ORAL
  Filled 2011-08-17: qty 2
  Filled 2011-08-17: qty 1
  Filled 2011-08-17 (×2): qty 2
  Filled 2011-08-17: qty 1

## 2011-08-17 MED ORDER — LABETALOL HCL 5 MG/ML IV SOLN
INTRAVENOUS | Status: DC | PRN
  Administered 2011-08-17 (×2): 10 mg via INTRAVENOUS

## 2011-08-17 MED ORDER — STERILE WATER FOR IRRIGATION IR SOLN
Status: DC | PRN
  Administered 2011-08-17: 3000 mL

## 2011-08-17 MED ORDER — ALPRAZOLAM 1 MG PO TABS
1.0000 mg | ORAL_TABLET | Freq: Three times a day (TID) | ORAL | Status: DC | PRN
  Administered 2011-08-18: 1 mg via ORAL
  Filled 2011-08-17: qty 1

## 2011-08-17 MED ORDER — PROMETHAZINE HCL 25 MG/ML IJ SOLN
12.5000 mg | Freq: Four times a day (QID) | INTRAMUSCULAR | Status: DC | PRN
  Administered 2011-08-17 – 2011-08-18 (×3): 12.5 mg via INTRAVENOUS
  Filled 2011-08-17 (×3): qty 1

## 2011-08-17 MED ORDER — ONDANSETRON HCL 4 MG/2ML IJ SOLN
INTRAMUSCULAR | Status: DC | PRN
  Administered 2011-08-17: 4 mg via INTRAVENOUS

## 2011-08-17 MED ORDER — HYDROMORPHONE HCL PF 1 MG/ML IJ SOLN
1.0000 mg | INTRAMUSCULAR | Status: DC | PRN
  Administered 2011-08-17 – 2011-08-18 (×4): 1 mg via INTRAVENOUS
  Filled 2011-08-17 (×4): qty 1

## 2011-08-17 MED ORDER — LACTATED RINGERS IR SOLN
Status: DC | PRN
  Administered 2011-08-17: 100 mL

## 2011-08-17 MED ORDER — PROPOFOL 10 MG/ML IV BOLUS
INTRAVENOUS | Status: DC | PRN
  Administered 2011-08-17: 150 mg via INTRAVENOUS

## 2011-08-17 MED ORDER — BUPIVACAINE HCL (PF) 0.25 % IJ SOLN
INTRAMUSCULAR | Status: AC
  Filled 2011-08-17: qty 30

## 2011-08-17 MED ORDER — LACTATED RINGERS IV SOLN
INTRAVENOUS | Status: DC
  Administered 2011-08-18: 125 mL/h via INTRAVENOUS
  Administered 2011-08-18: 21:00:00 via INTRAVENOUS

## 2011-08-17 MED ORDER — ACETAMINOPHEN 10 MG/ML IV SOLN
INTRAVENOUS | Status: AC
  Filled 2011-08-17: qty 100

## 2011-08-17 MED ORDER — CLINDAMYCIN PHOSPHATE 900 MG/50ML IV SOLN
900.0000 mg | Freq: Once | INTRAVENOUS | Status: AC
  Administered 2011-08-17: 900 mg via INTRAVENOUS
  Filled 2011-08-17: qty 50

## 2011-08-17 MED ORDER — ADULT MULTIVITAMIN W/MINERALS CH
1.0000 | ORAL_TABLET | Freq: Every day | ORAL | Status: DC
  Administered 2011-08-18 – 2011-08-19 (×2): 1 via ORAL
  Filled 2011-08-17 (×3): qty 1

## 2011-08-17 MED ORDER — LACTATED RINGERS IV SOLN
INTRAVENOUS | Status: DC | PRN
  Administered 2011-08-17 (×3): via INTRAVENOUS

## 2011-08-17 MED ORDER — POTASSIUM CHLORIDE ER 10 MEQ PO TBCR
10.0000 meq | EXTENDED_RELEASE_TABLET | Freq: Every day | ORAL | Status: DC
  Administered 2011-08-18 – 2011-08-19 (×2): 10 meq via ORAL
  Filled 2011-08-17 (×3): qty 1

## 2011-08-17 MED ORDER — OXYCODONE-ACETAMINOPHEN 5-325 MG PO TABS: 2.0000 | ORAL_TABLET | Freq: Four times a day (QID) | ORAL | Status: AC | PRN

## 2011-08-17 MED ORDER — HYDROMORPHONE HCL PF 1 MG/ML IJ SOLN
INTRAMUSCULAR | Status: DC | PRN
  Administered 2011-08-17 (×2): 1 mg via INTRAVENOUS

## 2011-08-17 MED ORDER — SENNA 8.6 MG PO TABS
1.0000 | ORAL_TABLET | Freq: Every day | ORAL | Status: DC
  Administered 2011-08-18 – 2011-08-19 (×2): 8.6 mg via ORAL
  Filled 2011-08-17 (×3): qty 1

## 2011-08-17 MED ORDER — LACTATED RINGERS IV SOLN
INTRAVENOUS | Status: DC
  Administered 2011-08-17: 20:00:00 via INTRAVENOUS

## 2011-08-17 MED ORDER — OMEGA-3-ACID ETHYL ESTERS 1 G PO CAPS
1.0000 g | ORAL_CAPSULE | Freq: Every day | ORAL | Status: DC
  Administered 2011-08-18 – 2011-08-19 (×2): 1 g via ORAL
  Filled 2011-08-17 (×3): qty 1

## 2011-08-17 MED ORDER — ROCURONIUM BROMIDE 100 MG/10ML IV SOLN
INTRAVENOUS | Status: DC | PRN
  Administered 2011-08-17: 20 mg via INTRAVENOUS
  Administered 2011-08-17: 10 mg via INTRAVENOUS
  Administered 2011-08-17 (×2): 20 mg via INTRAVENOUS
  Administered 2011-08-17: 60 mg via INTRAVENOUS

## 2011-08-17 MED ORDER — ACETAMINOPHEN 10 MG/ML IV SOLN
INTRAVENOUS | Status: DC | PRN
  Administered 2011-08-17: 1000 mg via INTRAVENOUS

## 2011-08-17 MED ORDER — OMEGA-3 FATTY ACIDS 1000 MG PO CAPS: 1.0000 | ORAL_CAPSULE | Freq: Every day | ORAL | Status: DC

## 2011-08-17 SURGICAL SUPPLY — 76 items
ADH SKN CLS APL DERMABOND .7 (GAUZE/BANDAGES/DRESSINGS) ×2
APL SKNCLS STERI-STRIP NONHPOA (GAUZE/BANDAGES/DRESSINGS) ×2
BAG SPEC RTRVL LRG 6X4 10 (ENDOMECHANICALS) ×2
BAG URINE DRAINAGE (UROLOGICAL SUPPLIES) ×3 IMPLANT
BENZOIN TINCTURE PRP APPL 2/3 (GAUZE/BANDAGES/DRESSINGS) ×3 IMPLANT
BLADE SURG 15 STRL LF DISP TIS (BLADE) ×2 IMPLANT
BLADE SURG 15 STRL SS (BLADE) ×3
CABLE HIGH FREQUENCY MONO STRZ (ELECTRODE) ×3 IMPLANT
CANISTER SUCTION 2500CC (MISCELLANEOUS) ×3 IMPLANT
CATH FOLEY 2WAY SLVR  5CC 16FR (CATHETERS) ×1
CATH FOLEY 2WAY SLVR 5CC 16FR (CATHETERS) ×2 IMPLANT
CATH ROBINSON RED A/P 16FR (CATHETERS) IMPLANT
CHLORAPREP W/TINT 26ML (MISCELLANEOUS) ×3 IMPLANT
CLEANER TIP ELECTROSURG 2X2 (MISCELLANEOUS) ×3 IMPLANT
CLOTH BEACON ORANGE TIMEOUT ST (SAFETY) ×3 IMPLANT
CORD HIGH FREQUENCY UNIPOLAR (ELECTROSURGICAL) ×2 IMPLANT
CORDS BIPOLAR (ELECTRODE) ×3 IMPLANT
COVER MAYO STAND STRL (DRAPES) ×3 IMPLANT
COVER SURGICAL LIGHT HANDLE (MISCELLANEOUS) ×3 IMPLANT
COVER TIP SHEARS 8 DVNC (MISCELLANEOUS) ×2 IMPLANT
COVER TIP SHEARS 8MM DA VINCI (MISCELLANEOUS) ×1
DECANTER SPIKE VIAL GLASS SM (MISCELLANEOUS) ×3 IMPLANT
DERMABOND ADVANCED (GAUZE/BANDAGES/DRESSINGS) ×1
DERMABOND ADVANCED .7 DNX12 (GAUZE/BANDAGES/DRESSINGS) ×2 IMPLANT
DEVICE TROCAR PUNCTURE CLOSURE (ENDOMECHANICALS) IMPLANT
DRAPE LG THREE QUARTER DISP (DRAPES) ×4 IMPLANT
DRAPE SURG IRRIG POUCH 19X23 (DRAPES) ×3 IMPLANT
DRAPE TABLE BACK 44X90 PK DISP (DRAPES) ×3 IMPLANT
DRAPE WARM FLUID 44X44 (DRAPE) ×2 IMPLANT
DRSG TEGADERM 6X8 (GAUZE/BANDAGES/DRESSINGS) ×9 IMPLANT
ELECT REM PT RETURN 9FT ADLT (ELECTROSURGICAL) ×3
ELECTRODE REM PT RTRN 9FT ADLT (ELECTROSURGICAL) ×2 IMPLANT
FILTER SMOKE EVAC LAPAROSHD (FILTER) IMPLANT
GAUZE SPONGE 4X4 16PLY XRAY LF (GAUZE/BANDAGES/DRESSINGS) ×3 IMPLANT
GAUZE VASELINE 3X9 (GAUZE/BANDAGES/DRESSINGS) IMPLANT
GLOVE BIO SURGEON STRL SZ 6.5 (GLOVE) ×6 IMPLANT
GLOVE BIO SURGEON STRL SZ7.5 (GLOVE) ×6 IMPLANT
GLOVE BIO SURGEON STRL SZ8 (GLOVE) ×3 IMPLANT
GLOVE BIOGEL M 6.5 STRL (GLOVE) ×9 IMPLANT
GLOVE BIOGEL PI IND STRL 7.0 (GLOVE) ×2 IMPLANT
GLOVE BIOGEL PI INDICATOR 7.0 (GLOVE) ×1
HOLDER FOLEY CATH W/STRAP (MISCELLANEOUS) ×3 IMPLANT
KIT ACCESSORY DA VINCI DISP (KITS) ×1
KIT ACCESSORY DVNC DISP (KITS) ×2 IMPLANT
MANIPULATOR UTERINE 4.5 ZUMI (MISCELLANEOUS) IMPLANT
NS IRRIG 1000ML POUR BTL (IV SOLUTION) ×3 IMPLANT
OCCLUDER COLPOPNEUMO (BALLOONS) ×1 IMPLANT
PACK LAPAROSCOPY W LONG (CUSTOM PROCEDURE TRAY) ×3 IMPLANT
PENCIL BUTTON HOLSTER BLD 10FT (ELECTRODE) ×3 IMPLANT
POUCH SPECIMEN RETRIEVAL 10MM (ENDOMECHANICALS) ×2 IMPLANT
SCISSORS LAP 5X35 DISP (ENDOMECHANICALS) ×3 IMPLANT
SET IRRIG TUBING LAPAROSCOPIC (IRRIGATION / IRRIGATOR) IMPLANT
SET TUBE IRRIG SUCTION NO TIP (IRRIGATION / IRRIGATOR) ×3 IMPLANT
SHEET LAVH (DRAPES) ×3 IMPLANT
SOLUTION ELECTROLUBE (MISCELLANEOUS) ×2 IMPLANT
SPONGE LAP 18X18 X RAY DECT (DISPOSABLE) IMPLANT
STRIP CLOSURE SKIN 1/2X4 (GAUZE/BANDAGES/DRESSINGS) ×3 IMPLANT
SUT MNCRL AB 4-0 PS2 18 (SUTURE) ×6 IMPLANT
SUT VIC AB 0 CT1 27 (SUTURE) ×9
SUT VIC AB 0 CT1 27XBRD ANTBC (SUTURE) ×6 IMPLANT
SUT VIC AB 3-0 PS2 18 (SUTURE) ×6
SUT VIC AB 3-0 PS2 18XBRD (SUTURE) ×4 IMPLANT
SUT VICRYL 0 UR6 27IN ABS (SUTURE) ×6 IMPLANT
SYR BULB IRRIGATION 50ML (SYRINGE) ×3 IMPLANT
SYR CONTROL 10ML LL (SYRINGE) ×3 IMPLANT
TOWEL NATURAL 10PK STERILE (DISPOSABLE) ×4 IMPLANT
TOWEL OR 17X26 10 PK STRL BLUE (TOWEL DISPOSABLE) ×3 IMPLANT
TROCAR BLADELESS OPT 5 100 (ENDOMECHANICALS) ×6 IMPLANT
TROCAR BLADELESS OPT 5 75 (ENDOMECHANICALS) ×3 IMPLANT
TROCAR ENDOPATH XCEL 12X100 BL (ENDOMECHANICALS) ×3 IMPLANT
TROCAR XCEL 12X100 BLDLESS (ENDOMECHANICALS) ×3 IMPLANT
TROCAR XCEL BLUNT TIP 100MML (ENDOMECHANICALS) ×3 IMPLANT
TUBING FILTER THERMOFLATOR (ELECTROSURGICAL) ×1 IMPLANT
TUBING NON-CON 1/4 X 20 CONN (TUBING) ×3 IMPLANT
UNDERPAD 30X30 INCONTINENT (UNDERPADS AND DIAPERS) ×3 IMPLANT
WATER STERILE IRR 1500ML POUR (IV SOLUTION) ×6 IMPLANT

## 2011-08-17 NOTE — Anesthesia Preprocedure Evaluation (Addendum)
Anesthesia Evaluation  Patient identified by MRN, date of birth, ID band Patient awake    Reviewed: Allergy & Precautions, H&P , NPO status , Patient's Chart, lab work & pertinent test results  Airway Mallampati: III TM Distance: >3 FB Neck ROM: full    Dental No notable dental hx. (+) Teeth Intact and Dental Advisory Given   Pulmonary neg pulmonary ROS, sleep apnea ,  Stop bang 5 breath sounds clear to auscultation  Pulmonary exam normal       Cardiovascular negative cardio ROS  Rhythm:regular Rate:Normal     Neuro/Psych negative neurological ROS  negative psych ROS   GI/Hepatic negative GI ROS, Neg liver ROS,   Endo/Other  negative endocrine ROS  Renal/GU negative Renal ROS  negative genitourinary   Musculoskeletal   Abdominal   Peds  Hematology negative hematology ROS (+)   Anesthesia Other Findings   Reproductive/Obstetrics negative OB ROS                          Anesthesia Physical Anesthesia Plan  ASA: III  Anesthesia Plan: General   Post-op Pain Management:    Induction: Intravenous  Airway Management Planned: Oral ETT  Additional Equipment:   Intra-op Plan:   Post-operative Plan: Extubation in OR  Informed Consent: I have reviewed the patients History and Physical, chart, labs and discussed the procedure including the risks, benefits and alternatives for the proposed anesthesia with the patient or authorized representative who has indicated his/her understanding and acceptance.   Dental Advisory Given  Plan Discussed with: CRNA and Surgeon  Anesthesia Plan Comments:         Anesthesia Quick Evaluation

## 2011-08-17 NOTE — Op Note (Signed)
Pre-operative Diagnosis: Previous h/o breast ca; ER/PR receptor positive; candidate for ovarian ablation  Post-operative Diagnosis: Same  Operation: Robotic-assisted right oophorectomy Surgeon: Roseanna Rainbow  Assistant: Coral Ceo, MD  Anesthesia: GET  Urine Output: 300 ml  Findings: Omental adhesions to the anterior abdominal wall; pelvic adhesions involving the sigmoid colon, right ovary, broad ligament   Estimated Blood Loss:  less than 50 mL                 Total IV Fluids: per Anesthesiology         Specimens: PATHOLOGY               Complications:  None; patient tolerated the procedure well.         Disposition: PACU - hemodynamically stable.         Condition: Stable    Procedure Details  The patient was seen in the Holding Room. The risks, benefits, complications, treatment options, and expected outcomes were discussed with the patient.  The patient concurred with the proposed plan, giving informed consent.  The site of surgery properly noted/marked. The patient was identified as Ashley Lindsey and the procedure verified as a Robotic-assisted right salpingo oophorectomy. A Time Out was held and the above information confirmed.  After induction of anesthesia, the patient was draped and prepped in the usual sterile manner. Pt was placed in supine position after anesthesia and draped and prepped in the usual sterile manner. The abdominal drape was placed after the CholoraPrep had been allowed to dry for 3 minutes.  Her arms were tucked to her side with all appropriate precautions.  The shoulder blocks were placed in the usual fashion.  The patient was placed in the semi-lithotomy position in [place] stirrups.  The perineum was prepped with Betadine.  Foley catheter was placed.  A sponge stick  was placed in the vagina.   OG tube placement was confirmed and to suction.  Approximately 2 cm below the costal margin, in the midclavicular line the skin was anesthestized  with 0.25% Marcaine.  A 5 mm incision was made and using a 5 mm Optiview, a 5 mm trocar was placed under direct vision.  The patient's abdomen was insufflated with CO2 gas.  At this point and all points during the procedure, the patient's intra-abdominal pressure did not exceed 15 mmHg.  A 10-12 camera port was place 24 cm above the pubic symphysis.  Bilateral 8 mm ports were place 10 cm and 15 degrees inferior.  All ports were placed under direct visualization.     The omental adhesions were divided with endoshears.  The robot was docked in the usual fashion.  The retroperitoneal space on the right was opened and explored. The ureter was identified.  A window was made between the infundibulopelvic ligament and the ureter.  The IP was coagulated with bipolar cautery and transected.  The above described adhesions were divided using sharp and blunt dissection.  The infundibulpelvic ligament  was  noted to be hemostatic.    The abdomen and pelvis were copiously irrigated.    The instruments were then removed under direct visualization and the robotic ports removed.  The robot was undocked. The ovary was placed in an endocatch bag and removed from the abdomen.   Deep, subcutaneous, figure-of-eight 0-Vicryl sutures on a UR-6 needle were placed in the 10-12 mm supraumbilical and infracostal incisions. Dermabond was applied to  all skin incisions.    All instrument and needle counts were correct  x  2.

## 2011-08-17 NOTE — Discharge Instructions (Signed)
Unilateral Salpingo-Oophorectomy Care After Please read the instructions outlined below. Refer to this sheet for the next few weeks. These instructions provide you with general information on caring for yourself after you leave the hospital. Your caregiver may also give you specific instructions. While your treatment has been planned according to the most current medical practices available, unavoidable problems may occur. If you have any problems or questions after you leave, call your caregiver. HOME CARE INSTRUCTIONS   It is normal to be sore for a week or two. Call your caregiver if the pain is getting worse or the pain medication is not helping.   Have help when you go home for a week or so to help with the household chores.   Follow your caregiver's advice regarding diet.   Get rest and sleep.   Only take over-the-counter or prescription medicines for pain or discomfort as directed by your caregiver.   Do not take aspirin. It can cause bleeding.   Do not drive, exercise or lift anything over 5 pounds.   Do not drink alcohol until your caregiver gives you permission.   Do not lift anything over 5 pounds.   Do not have sexual intercourse until your caregiver says it is OK.   Take your temperature twice a day and write it down.   Change the bandage (dressing) as directed.   Make and keep your follow-up appointments for postoperative care.   If you become constipated, ask your caregiver about taking a mild laxative. Drinking more liquids than usual and eating bran foods can help prevent constipation.  SEEK MEDICAL CARE IF:   You have swelling or redness around the cut (incision).   You develop a rash.   You have side effects from the medication.   You feel lightheaded.   You need more or stronger medication.   You have pain, swelling or redness where the IV (intravenous) was placed.  SEEK IMMEDIATE MEDICAL CARE IF:  You develop an unexplained temperature above 100 F  (37.8 C).   You develop increasing belly (abdominal) pain.   You have pus coming out of the incision.   You notice a bad smell coming from the wound or dressing.   The incision is separating.   There is excessive vaginal bleeding.   You start to feel sick to your stomach (nauseous) and vomit.   You have leg or chest pain.   You have pain when you urinate.   You develop shortness of breath.   You pass out.  MAKE SURE YOU:   Understand these instructions.   Will watch your condition.   Will get help right away if you are not doing well or get worse.  Document Released: 02/03/2009 Document Revised: 03/29/2011 Document Reviewed: 02/03/2009 Exit[employer] Patient Information 2012 [employer].   

## 2011-08-17 NOTE — Progress Notes (Signed)
Pink band placed on right arm

## 2011-08-17 NOTE — Progress Notes (Deleted)
Pink  Band applied to left arm

## 2011-08-17 NOTE — Transfer of Care (Signed)
Immediate Anesthesia Transfer of Care Note  Patient: Ashley Lindsey  Procedure(s) Performed: Procedure(s) (LRB): ROBOTIC ASSISTED SALPINGO OOPHERECTOMY (Right)  Patient Location: PACU  Anesthesia Type: General  Level of Consciousness: sedated  Airway & Oxygen Therapy: Patient Spontanous Breathing and Patient connected to face mask oxygen  Post-op Assessment: Report given to PACU RN and Post -op Vital signs reviewed and stable  Post vital signs: Reviewed and stable  Complications: No apparent anesthesia complications

## 2011-08-17 NOTE — Interval H&P Note (Signed)
History and Physical Interval Note:  08/17/2011 1:04 PM  Ashley Lindsey  has presented today for surgery, with the diagnosis of History of BREAST CANCER OVARIAN FUNCTION   The various methods of treatment have been discussed with the patient and family. After consideration of risks, benefits and other options for treatment, the patient has consented to  Procedure(s) (LRB): ROBOTIC ASSISTED SALPINGO OOPHERECTOMY (Right) LAPAROSCOPIC SALPINGO OOPHERECTOMY (N/A) as a surgical intervention .  The patients' history has been reviewed, patient examined, no change in status, stable for surgery.  I have reviewed the patients' chart and labs.  Questions were answered to the patient's satisfaction.     JACKSON-MOORE,Muriel Hannold A

## 2011-08-17 NOTE — Anesthesia Postprocedure Evaluation (Signed)
  Anesthesia Post-op Note  Patient: Ashley Lindsey  Procedure(s) Performed: Procedure(s) (LRB): ROBOTIC ASSISTED SALPINGO OOPHERECTOMY (Right)  Patient Location: PACU  Anesthesia Type: General  Level of Consciousness: awake and alert   Airway and Oxygen Therapy: Patient Spontanous Breathing  Post-op Pain: mild  Post-op Assessment: Post-op Vital signs reviewed, Patient's Cardiovascular Status Stable, Respiratory Function Stable, Patent Airway and No signs of Nausea or vomiting  Post-op Vital Signs: stable  Complications: No apparent anesthesia complications

## 2011-08-18 LAB — ABO/RH: ABO/RH(D): O POS

## 2011-08-18 NOTE — Progress Notes (Signed)
Subjective: Patient reports nausea, incisional pain and no problems voiding.  Has not been able to tolerate diet.  Objective: I have reviewed patient's vital signs, intake and output and medications.  General: alert and no distress Resp: clear to auscultation bilaterally Cardio: regular rate and rhythm, S1, S2 normal, no murmur, click, rub or gallop GI: normal findings: bowel sounds normal and incision: clean, dry and intact Extremities: extremities normal, atraumatic, no cyanosis or edema Vaginal Bleeding: none   Assessment/Plan: Increased nausea S/P robotic assisted laparoscopic oophorectomy.  Stable.  Will D/C home when able to tolerate diet.  LOS: 1 day    Tijah Hane A 08/18/2011, 11:38 AM

## 2011-08-19 NOTE — Progress Notes (Signed)
2 Days Post-Op Procedure(s) (LRB): ROBOTIC ASSISTED SALPINGO OOPHERECTOMY (Right)  Subjective: Patient reports tolerating PO, + flatus and no problems voiding.    Objective: I have reviewed patient's vital signs, intake and output, medications and labs.  General: alert and no distress Resp: clear to auscultation bilaterally Cardio: regular rate and rhythm, S1, S2 normal, no murmur, click, rub or gallop GI: soft, non-tender; bowel sounds normal; no masses,  no organomegaly and incision: clean, dry and intact Extremities: extremities normal, atraumatic, no cyanosis or edema Vaginal Bleeding: none  Assessment: s/p Procedure(s) (LRB): ROBOTIC ASSISTED SALPINGO OOPHERECTOMY (Right): progressing well and tolerating diet  Plan: Discharge home  LOS: 2 days    Joli Koob A 08/19/2011, 9:26 AM

## 2011-08-19 NOTE — Progress Notes (Signed)
Dr. Clearance Coots in room to see patient. Patient OOB to bathroom C/o gas pains, refuses pain meds. MD states to keep patient until afternoon to see if patient can have BM. Patient reports flatus and given laxative and prune juice. Patient about to ambulate. MD states ok to go ahead and remove IV

## 2011-08-19 NOTE — Progress Notes (Signed)
Spoke to Dr. Clearance Coots informed him patient up and states ready to go home today as soon as possible. Had some nausea during night after percocet but otherwise no complaints. MD states he will try to be around shortly to release patient.

## 2011-08-19 NOTE — Progress Notes (Signed)
Patient discharged via wheelchair. RX given for percocet x1. Assessment unchanged. States understanding of discharge instuctions.

## 2011-08-21 ENCOUNTER — Other Ambulatory Visit: Payer: Self-pay | Admitting: Oncology

## 2011-08-21 DIAGNOSIS — Z853 Personal history of malignant neoplasm of breast: Secondary | ICD-10-CM

## 2011-09-11 ENCOUNTER — Other Ambulatory Visit: Payer: Medicaid Other | Admitting: Lab

## 2011-09-12 ENCOUNTER — Other Ambulatory Visit: Payer: Medicaid Other | Admitting: Lab

## 2011-09-12 ENCOUNTER — Ambulatory Visit: Payer: Medicaid Other | Admitting: Oncology

## 2011-09-14 ENCOUNTER — Telehealth: Payer: Self-pay | Admitting: *Deleted

## 2011-09-14 NOTE — Telephone Encounter (Signed)
Confirmed 10/11/11 genetics appt w/ pt.

## 2011-09-23 ENCOUNTER — Other Ambulatory Visit: Payer: Self-pay | Admitting: Oncology

## 2011-09-23 DIAGNOSIS — Z853 Personal history of malignant neoplasm of breast: Secondary | ICD-10-CM

## 2011-09-25 ENCOUNTER — Telehealth: Payer: Self-pay | Admitting: *Deleted

## 2011-09-25 NOTE — Telephone Encounter (Signed)
Called patient to reschedule her genetic appt since Clydie Braun is not here on 6/20.  I confirmed 10/15/11 appt w/ pt.

## 2011-10-08 ENCOUNTER — Ambulatory Visit: Payer: Medicaid Other | Admitting: Radiation Oncology

## 2011-10-11 ENCOUNTER — Other Ambulatory Visit: Payer: Medicaid Other | Admitting: Lab

## 2011-10-11 ENCOUNTER — Encounter: Payer: Medicaid Other | Admitting: Genetic Counselor

## 2011-10-15 ENCOUNTER — Other Ambulatory Visit: Payer: Medicaid Other | Admitting: Lab

## 2011-10-15 ENCOUNTER — Ambulatory Visit: Payer: Medicaid Other | Admitting: Genetic Counselor

## 2011-10-15 NOTE — Progress Notes (Signed)
Patient did not show for her appointment.

## 2011-10-16 ENCOUNTER — Telehealth: Payer: Self-pay | Admitting: *Deleted

## 2011-10-16 NOTE — Telephone Encounter (Signed)
Patient called needed to reschedule her genetic appt.  Confirmed 11/19/11 appt w/ pt.

## 2011-10-23 ENCOUNTER — Encounter: Payer: Self-pay | Admitting: Radiation Oncology

## 2011-10-24 ENCOUNTER — Ambulatory Visit: Admission: RE | Admit: 2011-10-24 | Payer: Medicaid Other | Source: Ambulatory Visit | Admitting: Radiation Oncology

## 2011-10-24 ENCOUNTER — Other Ambulatory Visit: Payer: No Typology Code available for payment source | Admitting: Lab

## 2011-10-31 ENCOUNTER — Telehealth: Payer: Self-pay | Admitting: *Deleted

## 2011-10-31 NOTE — Telephone Encounter (Signed)
CALLED PATIENT TO RESCHEDULE MISSED FU VISIT ON 10-24-11, RESCHEDULED FOR 11-19-11 AT 9:40 AM, SPOKE WITH PATIENT AND SHE AGREED TO NEW TIME AND DATE

## 2011-11-06 ENCOUNTER — Ambulatory Visit: Payer: Medicaid Other | Admitting: Oncology

## 2011-11-08 ENCOUNTER — Ambulatory Visit (HOSPITAL_BASED_OUTPATIENT_CLINIC_OR_DEPARTMENT_OTHER): Payer: Medicaid Other | Attending: Internal Medicine

## 2011-11-12 ENCOUNTER — Telehealth: Payer: Self-pay | Admitting: *Deleted

## 2011-11-12 NOTE — Telephone Encounter (Signed)
Harriett Sine with Outpatient Rehab at [facility] called requesting a faxed order for referral for this patient.  Reports pt. Told her Lawson Fiscal was to fax an order.  No orders noted for PT referral.  Asked if patient is having Lymphedema and needs referral.  Patient contacted them asking if they've received referral because MD told her at last visit she needs PT.  Patient told nancy she has fibromyalgia and her arms are swelling, she is miserable and asked if referral has been received.  Harriett Sine has scheduled appt on 11-21-2011 but would like a call or an order so she is not holding an appt unnecessarily.  Will notify providers.  Next f/u is 01-02-2012.

## 2011-11-19 ENCOUNTER — Ambulatory Visit: Payer: Medicaid Other | Admitting: Radiation Oncology

## 2011-11-19 ENCOUNTER — Ambulatory Visit: Payer: Medicaid Other | Admitting: Genetic Counselor

## 2011-11-19 ENCOUNTER — Other Ambulatory Visit: Payer: Medicaid Other

## 2011-11-19 ENCOUNTER — Encounter: Payer: Self-pay | Admitting: Genetic Counselor

## 2011-11-19 NOTE — Progress Notes (Signed)
Patient did not show for her appointment.

## 2011-11-20 ENCOUNTER — Other Ambulatory Visit: Payer: Self-pay | Admitting: Oncology

## 2011-11-21 ENCOUNTER — Ambulatory Visit: Payer: Medicaid Other | Admitting: Physical Therapy

## 2011-11-22 ENCOUNTER — Ambulatory Visit (HOSPITAL_BASED_OUTPATIENT_CLINIC_OR_DEPARTMENT_OTHER): Payer: Medicaid Other | Attending: Internal Medicine

## 2011-11-22 VITALS — Ht 62.0 in | Wt 239.0 lb

## 2011-11-22 DIAGNOSIS — G4733 Obstructive sleep apnea (adult) (pediatric): Secondary | ICD-10-CM

## 2011-11-23 ENCOUNTER — Telehealth: Payer: Self-pay | Admitting: Oncology

## 2011-11-23 NOTE — Telephone Encounter (Signed)
Genetic letter mailed out.  °

## 2011-11-27 ENCOUNTER — Encounter (HOSPITAL_BASED_OUTPATIENT_CLINIC_OR_DEPARTMENT_OTHER): Payer: Self-pay

## 2011-11-29 ENCOUNTER — Telehealth: Payer: Self-pay | Admitting: Radiation Oncology

## 2011-11-29 NOTE — Telephone Encounter (Signed)
Patient phoned today concerned about occasional brief sharp shooting pain in her treated breast. Patient reports this is "a new pain she has never felt before." Also, patient concern about fluid collection in that same breast. Encouraged patient to contact Dr. Jacinto Halim office and keep appointment with Dr. Roselind Messier on 8/14. Encouraged patient to call with future needs. Patient verbalized understanding of all reviewed.

## 2011-12-01 DIAGNOSIS — R0989 Other specified symptoms and signs involving the circulatory and respiratory systems: Secondary | ICD-10-CM

## 2011-12-01 DIAGNOSIS — R0609 Other forms of dyspnea: Secondary | ICD-10-CM

## 2011-12-01 DIAGNOSIS — G4733 Obstructive sleep apnea (adult) (pediatric): Secondary | ICD-10-CM

## 2011-12-02 NOTE — Procedures (Signed)
NAME:  Ashley Lindsey, Ashley Lindsey                ACCOUNT NO.:  [id number]  MEDICAL RECORD NO.:  [id number]          PATIENT TYPE:  OUT  LOCATION:  SLEEP CENTER                 FACILITY:  [facility]  PHYSICIAN:  Pruitt Taboada D. Maple Hudson, MD, FCCP, FACPDATE OF BIRTH:  March 07, 1961  DATE OF STUDY:  11/22/2011                           NOCTURNAL POLYSOMNOGRAM  REFERRING PHYSICIAN:  Merlene Laughter. Renae Gloss, M.D.  INDICATION FOR STUDY:  Hypersomnia with sleep apnea.  EPWORTH SLEEPINESS SCORE:  12/24.  BMI 43.7, weight 239 pounds.  Height 6 feet 2 inches, neck 14 inches.  MEDICATIONS:  Home medications are charted and reviewed.  SLEEP ARCHITECTURE:  Total sleep time 272.5 minutes with sleep efficiency 66%.  Stage I was 24.6%, stage II 58.5%, stage III absent. REM 16.9% of total sleep time.  Sleep latency 30.5 minutes.  REM latency 156 minutes.  Awake after sleep onset 91 minute.  Arousal index 11.4.  BEDTIME MEDICATION:  Xanax.  Sleep was marked by a sustained wakefulness before midnight and frequent brief wakings throughout much of the rest of the night.  RESPIRATORY DATA:  Apnea-hypopnea index (AHI) 16.1 per hour.  A total of 73 events was scored including 26 obstructive apneas, 1 central apnea, 46 hypopneas.  Events were more common while supine.  REM AHI 83.5 per hour.  Because of her lack of sustained early sleep, she did not meet protocol requirements for application of split protocol CPAP titration on the study.  OXYGEN DATA:  Loud snoring with oxygen desaturation to a nadir of 70% and mean oxygen saturation through the study of 91.6% on room air.  CARDIAC DATA:  Normal sinus rhythm.  MOVEMENT/PARASOMNIA:  No significant movement disturbance.  Bathroom x1.  IMPRESSION/RECOMMENDATION: 1. Sleep pattern was marked by difficulty initiating and maintaining     sleep.  Frequent spontaneous wakings were noted through the night,     not necessarily all related to respiratory events.  She may benefit     from  treatment for an insomnia component separate from her old     obstructive apnea. 2. Moderate obstructive sleep apnea/hypopnea syndrome, AHI 16.1 per     hour with events in all sleep positions.  Loud snoring with oxygen     desaturation to a nadir of 70% and mean oxygen saturation through     the study of 91.6% on room air. 3. She was unable to sustain sleep sufficient to meet protocol     requirements for application of split protocol CPAP     titration on this night.  Consider return for dedicated CPAP     titration study or alternative management as clinically     appropriate.     Kelee Cunningham D. Maple Hudson, MD, [facility], FACP Diplomate, American Board of Sleep Medicine    CDY/MEDQ  D:  12/01/2011 65:78:46  T:  12/02/2011 02:03:19  Job:  [id number]

## 2011-12-05 ENCOUNTER — Ambulatory Visit: Admission: RE | Admit: 2011-12-05 | Payer: Medicaid Other | Source: Ambulatory Visit | Admitting: Radiation Oncology

## 2011-12-10 ENCOUNTER — Encounter: Payer: Self-pay | Admitting: *Deleted

## 2011-12-10 NOTE — Progress Notes (Unsigned)
Pt reports that her Medicaid has been reinstated and needs a pre-auth. For lyrica. Requests for assistance had been given to [facility]

## 2011-12-12 ENCOUNTER — Ambulatory Visit: Payer: Medicaid Other | Attending: Oncology | Admitting: Physical Therapy

## 2011-12-12 DIAGNOSIS — M25519 Pain in unspecified shoulder: Secondary | ICD-10-CM | POA: Insufficient documentation

## 2011-12-12 DIAGNOSIS — M24519 Contracture, unspecified shoulder: Secondary | ICD-10-CM | POA: Insufficient documentation

## 2011-12-12 DIAGNOSIS — IMO0001 Reserved for inherently not codable concepts without codable children: Secondary | ICD-10-CM | POA: Insufficient documentation

## 2011-12-12 DIAGNOSIS — I890 Lymphedema, not elsewhere classified: Secondary | ICD-10-CM | POA: Insufficient documentation

## 2011-12-23 ENCOUNTER — Encounter (HOSPITAL_COMMUNITY): Payer: Self-pay

## 2011-12-23 ENCOUNTER — Emergency Department (HOSPITAL_COMMUNITY)
Admission: EM | Admit: 2011-12-23 | Discharge: 2011-12-23 | Disposition: A | Discharge status: Home / Self Care | Payer: Medicaid Other | Attending: Emergency Medicine | Admitting: Emergency Medicine

## 2011-12-23 ENCOUNTER — Emergency Department (HOSPITAL_COMMUNITY): Payer: Medicaid Other

## 2011-12-23 DIAGNOSIS — Z9010 Acquired absence of unspecified breast and nipple: Secondary | ICD-10-CM | POA: Insufficient documentation

## 2011-12-23 DIAGNOSIS — Z923 Personal history of irradiation: Secondary | ICD-10-CM | POA: Insufficient documentation

## 2011-12-23 DIAGNOSIS — R0789 Other chest pain: Secondary | ICD-10-CM

## 2011-12-23 DIAGNOSIS — R079 Chest pain, unspecified: Secondary | ICD-10-CM | POA: Insufficient documentation

## 2011-12-23 DIAGNOSIS — G4730 Sleep apnea, unspecified: Secondary | ICD-10-CM | POA: Insufficient documentation

## 2011-12-23 DIAGNOSIS — F17200 Nicotine dependence, unspecified, uncomplicated: Secondary | ICD-10-CM | POA: Insufficient documentation

## 2011-12-23 DIAGNOSIS — R0602 Shortness of breath: Secondary | ICD-10-CM | POA: Insufficient documentation

## 2011-12-23 DIAGNOSIS — Z853 Personal history of malignant neoplasm of breast: Secondary | ICD-10-CM | POA: Insufficient documentation

## 2011-12-23 DIAGNOSIS — Z79899 Other long term (current) drug therapy: Secondary | ICD-10-CM | POA: Insufficient documentation

## 2011-12-23 LAB — D-DIMER, QUANTITATIVE: D-Dimer, Quant: 0.38 ug/mL-FEU (ref 0.00–0.48)

## 2011-12-23 MED ORDER — HYDROCODONE-ACETAMINOPHEN 5-325 MG PO TABS: 2.0000 | ORAL_TABLET | Freq: Four times a day (QID) | ORAL | Status: AC | PRN

## 2011-12-23 MED ORDER — MORPHINE SULFATE 4 MG/ML IJ SOLN
4.0000 mg | Freq: Once | INTRAMUSCULAR | Status: AC
  Administered 2011-12-23: 4 mg via INTRAVENOUS
  Filled 2011-12-23: qty 1

## 2011-12-23 MED ORDER — DIAZEPAM 5 MG PO TABS
5.0000 mg | ORAL_TABLET | Freq: Once | ORAL | Status: AC
  Administered 2011-12-23: 5 mg via ORAL
  Filled 2011-12-23: qty 1

## 2011-12-23 MED ORDER — ONDANSETRON HCL 4 MG/2ML IJ SOLN
INTRAMUSCULAR | Status: AC
  Administered 2011-12-23: 13:00:00
  Filled 2011-12-23: qty 2

## 2011-12-23 NOTE — ED Provider Notes (Signed)
History     CSN: [id number]  Arrival date & time 12/23/11  1246   First MD Initiated Contact with Patient 12/23/11 1248      Chief Complaint  Patient presents with  . Chest Pain    (Consider location/radiation/quality/duration/timing/severity/associated sxs/prior treatment) HPI Comments: Patient comes in today with a chief complaint of right sided chest pain. Pain has been present intermittently since earlier this morning.  Pain has been constant for the past hour.  She states that she has had similar pain in the past and has been diagnosed with Costochondritis.  Pain worse with palpation.   She was brought into the ED by EMS and was given Aspirin and NG by EMS en route.  She does not feel that the Aspirin and NG has helped.  She stated that she did vomit once in the ambulance, but was not nauseous prior to that.  CP is associated with some SOB.  No prior cardiac disease.  She does have a history of breast cancer and is currently in remission.  Last chemotherapy was in February 2012.  She denies prior history of DVT or PE.  She is not on any anticoagulants at this time.  She is on on any estrogen containing medication at this time.  She denies LE pain, swelling, or erythema.  She also has a history of Fibromyalgia.  The history is provided by the patient.    Past Medical History  Diagnosis Date  . Abdominal cramps   . Muscle spasms of head and/or neck     following to the breast  . Fatigue   . Passed out     4th of july  . Migraines   . Chronic headaches   . Arthritis   . Cancer     s/p radiation- last dose 6/12//has been off Tamoxifen 8/12  . Sleep apnea     STOP BANG SCORE 5  . Lymphedema     RIGHT ARM-////  STATES USE LEFT ARM FOR BP'S  . S/P radiation therapy 08/19/07 - 10/10/07    Left Breast/5040 cGy/28 fractions with Boost for a toatl dose of 6300 dGy    Past Surgical History  Procedure Date  . Breast lumpectomy 2009/2012    LEFT/RIGHT with lymph node dissection  .  Abdominal hysterectomy     with left salpingooophorectomy  . Knee arthroscopy     right    Family History  Problem Relation Age of Onset  . Breast cancer Mother   . Heart disease Father   . Cervical cancer Paternal Aunt   . Prostate cancer Maternal Uncle     History  Substance Use Topics  . Smoking status: Current Everyday Smoker -- 0.2 packs/day for 1 years    Types: Cigarettes  . Smokeless tobacco: Not on file  . Alcohol Use: Yes     3 x a week    OB History    Grav Para Term Preterm Abortions TAB SAB Ect Mult Living                  Review of Systems  Constitutional: Negative for fever and chills.  HENT: Negative for neck pain and neck stiffness.   Respiratory: Positive for shortness of breath. Negative for cough and wheezing.   Cardiovascular: Positive for chest pain. Negative for palpitations and leg swelling.  Gastrointestinal: Negative for abdominal pain.  Skin: Negative for color change.  Neurological: Negative for dizziness, syncope, light-headedness and numbness.    Allergies  Penicillins  Home Medications   Current Outpatient Rx  Name Route Sig Dispense Refill  . XANAX PO Oral Take 1 mg by mouth 3 (three) times daily as needed. For anxiety     . BUTALBITAL-ASA-CAFFEINE 50-325-40 MG PO CAPS Oral Take 1 capsule by mouth 3 (three) times daily as needed. For headache    . CYCLOBENZAPRINE HCL 10 MG PO TABS  TAKE 1 TABLET THREE TIMES DAILY AS NEEDED FOR MUSCLE SPASMS 90 tablet 1  . OMEGA-3 FATTY ACIDS 1000 MG PO CAPS Oral Take 1 capsule by mouth daily with breakfast.     . MAGNESIUM OXIDE 400 MG PO TABS Oral Take 400 mg by mouth daily with breakfast.     . ONE-DAILY MULTI VITAMINS PO TABS Oral Take 1 tablet by mouth daily with breakfast.     . NAPROXEN SODIUM 220 MG PO TABS Oral Take 220 mg by mouth 2 (two) times daily with a meal.    . OVER THE COUNTER MEDICATION  TYLENOL CREAM BID PRN TOPICALLY    . POTASSIUM CHLORIDE ER 10 MEQ PO TBCR Oral Take 10 mEq by  mouth daily with breakfast.     . PREGABALIN 50 MG PO CAPS Oral Take 75 mg by mouth 2 (two) times daily.     Marland Kitchen ALIGN PO Oral Take 1 capsule by mouth daily with breakfast.     . SENNOSIDES 8.6 MG PO TABS Oral Take 1 tablet by mouth daily.    Marland Kitchen VITAMIN D (ERGOCALCIFEROL) 50000 UNITS PO CAPS Oral Take 50,000 Units by mouth 2 (two) times a week. Monday and Thursday      BP 108/69  Temp 98.1 F (36.7 C) (Oral)  Resp 20  SpO2 97%  Physical Exam  Nursing note and vitals reviewed. Constitutional: She appears well-developed and well-nourished. No distress.  HENT:  Head: Normocephalic and atraumatic.  Mouth/Throat: Oropharynx is clear and moist.  Neck: Normal range of motion. Neck supple.  Cardiovascular: Normal rate, regular rhythm, normal heart sounds and intact distal pulses.   Pulmonary/Chest: Effort normal and breath sounds normal. No respiratory distress. She has no decreased breath sounds. She has no wheezes. She has no rales. She exhibits tenderness.       Right chest wall tender to palpation  Abdominal: Soft. Bowel sounds are normal. There is no tenderness.  Musculoskeletal: Normal range of motion.  Neurological: She is alert.  Skin: Skin is warm and dry. She is not diaphoretic.  Psychiatric: She has a normal mood and affect.    ED Course  Procedures (including critical care time)   Labs Reviewed  TROPONIN I  CBC WITH DIFFERENTIAL  BASIC METABOLIC PANEL   Dg Chest 2 View  12/23/2011  *RADIOLOGY REPORT*  Clinical Data: Chest pain and shortness of breath.  Lumpectomy and lymph nodes removed from right side on 02/13.  Increased pain in right breast area.  CHEST - 2 VIEW  Comparison: 05/23/2010  Findings: Lateral view degraded by patient arm positioning.  Mild lower thoracic spondylosis.  Midline trachea.  Normal heart size and mediastinal contours. No pleural effusion or pneumothorax.  Biapical pleural thickening. Diffuse peribronchial thickening.  Clear lungs.  IMPRESSION: 1.  No  acute cardiopulmonary disease. 2.  Mild peribronchial thickening which may relate to chronic bronchitis or smoking.   Original Report Authenticated By: Consuello Bossier, M.D.      No diagnosis found.   Date: 12/23/2011  Rate: 91  Rhythm: normal sinus rhythm  QRS Axis: normal  Intervals: normal  ST/T Wave abnormalities: normal  Conduction Disutrbances:none  Narrative Interpretation:   Old EKG Reviewed: unchanged    MDM  Patient comes in today complaining of right sided chest pain.  Chest tender to palpation on exam.  She reports that she has had similar pain in the past and has been diagnosed with Costochondritis.  No prior cardiac history.  Normal EKG.  No acute findings on CXR.  D-dimer negative.  Therefore, do not feel that patient's pain is pulmonary or cardiac in nature.          Pascal Lux [place], PA-C 12/24/11 0031

## 2011-12-23 NOTE — ED Notes (Signed)
Pt with sudden onset of chest pain, was diaphoretic, SOB pain 10/10, pain in middle right side

## 2011-12-23 NOTE — ED Notes (Signed)
Patient transported to X-ray 

## 2011-12-23 NOTE — ED Provider Notes (Signed)
Plains of right-sided anterior chest pain onset 8:30 AM today gradually worse with deep inspiration and with moving changing positions the pain starts at nipple and radiates to her right chest feels like costochondritis she's had in the past. On exam appears uncomfortable lungs clear to auscultation heart regular rate and rhythm right breast nipple appears normal right chest is tender anteriorly reproducing pain exactly. Strongly doubt acute coronary syndrome based on symptoms and normal EKG. Symptoms and exam most consistent with musculoskeletal pain  Doug Sou, MD 12/23/11 1355

## 2011-12-24 NOTE — ED Provider Notes (Signed)
Medical screening examination/treatment/procedure(s) were conducted as a shared visit with non-physician practitioner(s) and myself.  I personally evaluated the patient during the encounter  Doug Sou, MD 12/24/11 [phone]

## 2011-12-26 ENCOUNTER — Ambulatory Visit: Payer: Medicaid Other | Attending: Oncology | Admitting: Physical Therapy

## 2011-12-26 ENCOUNTER — Other Ambulatory Visit (HOSPITAL_BASED_OUTPATIENT_CLINIC_OR_DEPARTMENT_OTHER): Payer: Medicaid Other | Admitting: Lab

## 2011-12-26 ENCOUNTER — Other Ambulatory Visit: Payer: Self-pay | Admitting: *Deleted

## 2011-12-26 DIAGNOSIS — E559 Vitamin D deficiency, unspecified: Secondary | ICD-10-CM

## 2011-12-26 DIAGNOSIS — I890 Lymphedema, not elsewhere classified: Secondary | ICD-10-CM | POA: Insufficient documentation

## 2011-12-26 DIAGNOSIS — C50919 Malignant neoplasm of unspecified site of unspecified female breast: Secondary | ICD-10-CM

## 2011-12-26 DIAGNOSIS — M24519 Contracture, unspecified shoulder: Secondary | ICD-10-CM | POA: Insufficient documentation

## 2011-12-26 DIAGNOSIS — M25519 Pain in unspecified shoulder: Secondary | ICD-10-CM | POA: Insufficient documentation

## 2011-12-26 DIAGNOSIS — IMO0001 Reserved for inherently not codable concepts without codable children: Secondary | ICD-10-CM | POA: Insufficient documentation

## 2011-12-26 LAB — CBC WITH DIFFERENTIAL/PLATELET
BASO%: 0.4 % (ref 0.0–2.0)
Basophils Absolute: 0 10*3/uL (ref 0.0–0.1)
EOS%: 2.4 % (ref 0.0–7.0)
Eosinophils Absolute: 0.2 10*3/uL (ref 0.0–0.5)
HCT: 39.4 % (ref 34.8–46.6)
HGB: 12.8 g/dL (ref 11.6–15.9)
LYMPH%: 14.3 % (ref 14.0–49.7)
MCH: 28 pg (ref 25.1–34.0)
MCHC: 32.6 g/dL (ref 31.5–36.0)
MCV: 86 fL (ref 79.5–101.0)
MONO#: 0.3 10*3/uL (ref 0.1–0.9)
MONO%: 3.8 % (ref 0.0–14.0)
NEUT#: 6.5 10*3/uL (ref 1.5–6.5)
NEUT%: 79.1 % — ABNORMAL HIGH (ref 38.4–76.8)
Platelets: 220 10*3/uL (ref 145–400)
RBC: 4.59 10*6/uL (ref 3.70–5.45)
RDW: 13.8 % (ref 11.2–14.5)
WBC: 8.2 10*3/uL (ref 3.9–10.3)
lymph#: 1.2 10*3/uL (ref 0.9–3.3)

## 2011-12-26 LAB — COMPREHENSIVE METABOLIC PANEL (CC13)
ALT: 22 U/L (ref 0–55)
AST: 16 U/L (ref 5–34)
Albumin: 3.5 g/dL (ref 3.5–5.0)
Alkaline Phosphatase: 106 U/L (ref 40–150)
BUN: 12 mg/dL (ref 7.0–26.0)
CO2: 25 mEq/L (ref 22–29)
Calcium: 9.3 mg/dL (ref 8.4–10.4)
Chloride: 105 mEq/L (ref 98–107)
Creatinine: 0.7 mg/dL (ref 0.6–1.1)
Glucose: 103 mg/dl — ABNORMAL HIGH (ref 70–99)
Potassium: 4.4 mEq/L (ref 3.5–5.1)
Sodium: 139 mEq/L (ref 136–145)
Total Bilirubin: <0.2 mg/dL (ref 0.20–1.20)
Total Protein: 7 g/dL (ref 6.4–8.3)

## 2011-12-26 MED ORDER — PREGABALIN 75 MG PO CAPS: 75.0000 mg | ORAL_CAPSULE | Freq: Two times a day (BID) | ORAL | Status: DC

## 2011-12-27 LAB — VITAMIN D 25 HYDROXY (VIT D DEFICIENCY, FRACTURES): Vit D, 25-Hydroxy: 32 ng/mL (ref 30–89)

## 2011-12-27 LAB — CANCER ANTIGEN 27.29: CA 27.29: 26 U/mL (ref 0–39)

## 2011-12-31 ENCOUNTER — Ambulatory Visit: Payer: Medicaid Other | Admitting: Physical Therapy

## 2012-01-02 ENCOUNTER — Ambulatory Visit: Payer: Medicaid Other | Admitting: Oncology

## 2012-01-03 ENCOUNTER — Ambulatory Visit: Payer: Medicaid Other

## 2012-01-04 ENCOUNTER — Telehealth: Payer: Self-pay | Admitting: *Deleted

## 2012-01-04 ENCOUNTER — Ambulatory Visit (HOSPITAL_BASED_OUTPATIENT_CLINIC_OR_DEPARTMENT_OTHER): Payer: Medicaid Other | Admitting: Oncology

## 2012-01-04 VITALS — BP 145/89 | HR 94 | Temp 99.2°F | Resp 20 | Ht 62.0 in | Wt 234.8 lb

## 2012-01-04 DIAGNOSIS — C50919 Malignant neoplasm of unspecified site of unspecified female breast: Secondary | ICD-10-CM

## 2012-01-04 DIAGNOSIS — Z853 Personal history of malignant neoplasm of breast: Secondary | ICD-10-CM

## 2012-01-04 DIAGNOSIS — Z170 Estrogen receptor positive status [ER+]: Secondary | ICD-10-CM

## 2012-01-04 NOTE — Telephone Encounter (Signed)
03-28-2012   04-04-2012  Printed out calendar and gave patient

## 2012-01-05 ENCOUNTER — Other Ambulatory Visit: Payer: Self-pay | Admitting: Oncology

## 2012-01-06 NOTE — Progress Notes (Signed)
Hematology and Oncology Follow Up Visit  Ashley Lindsey [id number] 03-Jun-1960 51 y.o. 01/06/2012 7:01 PM PCP Dr Tamela Oddi  Principle Diagnosis: 51 year old woman with history of a T1 B. tubular cancer associated DCIS, ER/PR positive, status post lumpectomy and sentinel lymph node evaluation 07/21/2007, status post completion of radiation, on tamoxifen. Recent diagnosis of DCIS in the right breast status post lumpectomy and radiation therapy completed February 2012 Status post oophorectomy.  Interim History:  There have been no intercurrent illness, hospitalizations or medication changes. She continues on multiple medications include Lyrica Ultram Toradol Naprosyn , fiorinal  all as well as Xanax. She is doing a bit better on low dose of Lyrica. She does be drawn tamoxifen fairly well. She's had no other intercurrent illnesses. She continues: Of arthralgias and myalgias.  Medications: I have reviewed the patient's current medications.  Allergies:  Allergies  Allergen Reactions  . Penicillins Hives and Swelling    Past Medical History, Surgical history, Social history, and Family History were reviewed and updated.  Review of Systems: Constitutional:  Negative for fever, chills, night sweats, anorexia, weight loss, pain. Cardiovascular: no chest pain or dyspnea on exertion Respiratory: no cough, shortness of breath, or wheezing Neurological: negative Dermatological: negative ENT: negative Skin Gastrointestinal: negative Genito-Urinary: negative Hematological and Lymphatic: negative Breast: negative Musculoskeletal: negative Remaining ROS negative.  Physical Exam: Blood pressure 145/89, pulse 94, temperature 99.2 F (37.3 C), temperature source Oral, resp. rate 20, height 5\' 2"  (1.575 m), weight 234 lb 12.8 oz (106.505 kg). ECOG:  General appearance: alert, cooperative and appears stated age Head: Normocephalic, without obvious abnormality, atraumatic Neck: no adenopathy,  no carotid bruit, no JVD, supple, symmetrical, trachea midline and thyroid not enlarged, symmetric, no tenderness/mass/nodules Lymph nodes: Cervical, supraclavicular, and axillary nodes normal. Cardiac : regular rate and rhythm, no murmurs or gallops Pulmonary:clear to auscultation bilaterally and normal percussion bilaterally Breasts: inspection negative, no nipple discharge or bleeding, no masses or nodularity palpable, she is status post bilateral lumpectomies, both scars have healed well. There is no evidence of local recurrence Abdomen:soft, non-tender; bowel sounds normal; no masses,  no organomegaly Extremities negative Neuro: alert, oriented, normal speech, no focal findings or movement disorder noted  Lab Results: Lab Results  Component Value Date   WBC 8.2 12/26/2011   HGB 12.8 12/26/2011   HCT 39.4 12/26/2011   MCV 86.0 12/26/2011   PLT 220 12/26/2011     Chemistry      Component Value Date/Time   NA 139 12/26/2011 1538   NA 140 01/19/2011 1431   K 4.4 12/26/2011 1538   K 4.2 01/19/2011 1431   CL 105 12/26/2011 1538   CL 104 01/19/2011 1431   CO2 25 12/26/2011 1538   CO2 28 01/19/2011 1431   BUN 12.0 12/26/2011 1538   BUN 11 01/19/2011 1431   CREATININE 0.7 12/26/2011 1538   CREATININE 0.78 01/19/2011 1431      Component Value Date/Time   CALCIUM 9.3 12/26/2011 1538   CALCIUM 9.3 01/19/2011 1431   ALKPHOS 106 12/26/2011 1538   ALKPHOS 86 01/19/2011 1431   AST 16 12/26/2011 1538   AST 17 01/19/2011 1431   ALT 22 12/26/2011 1538   ALT 16 01/19/2011 1431   BILITOT <0.20 Repeated and Verified 12/26/2011 1538   BILITOT 0.2* 01/19/2011 1431      .pathology. Radiological Studies: chest X-ray n/a Mammogram n/a Bone density n/a  Impression and Plan: Patient is doing well. She has bilateral breast cancers. She is also status post  nephrectomy as such menopausal. She has not been initiated on an AI. She needs a lot of joint and bone pain. I am therefore reluctant to do so. She has not had genetic  testing as well. I have recommended that she try a course of Cymbalta. She'll try to get this where primary care doctor's office. I will try to reevaluate her in 3 months time to see how she's doing. She will likely need a followup mammogram in 6 months. As well. More than 50% of the visit was spent in patient-related counselling   Pierce Crane, MD 9/15/20137:01 PM

## 2012-01-07 ENCOUNTER — Ambulatory Visit: Payer: Medicaid Other

## 2012-01-07 ENCOUNTER — Other Ambulatory Visit: Payer: Self-pay | Admitting: *Deleted

## 2012-01-07 NOTE — Telephone Encounter (Signed)
EXPLAINED TO PT. THAT DR.RUBIN WOULD LIKE PT. TO OBTAIN THE FLEXERIL FROM HER PRIMARY CARE PHYSICIAN. SUGGESTED THAT PT. CALL HER PRIMARY CARE PHYSICIAN CONCERNING HER PAIN ISSUES TO SEE IF PCP COULD SEE PT. EARLIER. SHE VOICES UNDERSTANDING.

## 2012-01-07 NOTE — Telephone Encounter (Signed)
PATIENT TO HAVE PAIN MANAGEMENT BY PRIMARY CARE, PER VISIT ON 01/04/12.

## 2012-01-09 ENCOUNTER — Ambulatory Visit: Payer: Medicaid Other

## 2012-01-11 ENCOUNTER — Encounter: Payer: Self-pay | Admitting: Radiation Oncology

## 2012-01-11 DIAGNOSIS — Z923 Personal history of irradiation: Secondary | ICD-10-CM | POA: Insufficient documentation

## 2012-01-14 ENCOUNTER — Ambulatory Visit: Payer: Medicaid Other | Admitting: Radiation Oncology

## 2012-01-21 ENCOUNTER — Telehealth: Payer: Self-pay | Admitting: *Deleted

## 2012-01-21 NOTE — Telephone Encounter (Signed)
Patient called and scheduled a genetic appt on 11.21.13.

## 2012-02-04 ENCOUNTER — Ambulatory Visit: Payer: Medicaid Other | Admitting: Radiation Oncology

## 2012-02-06 ENCOUNTER — Telehealth: Payer: Self-pay | Admitting: *Deleted

## 2012-02-06 NOTE — Telephone Encounter (Signed)
Patient called in and requested a flu shot for 02-07-2012

## 2012-02-07 ENCOUNTER — Encounter: Payer: Self-pay | Admitting: Radiation Oncology

## 2012-02-07 ENCOUNTER — Ambulatory Visit
Admission: RE | Admit: 2012-02-07 | Discharge: 2012-02-07 | Disposition: A | Discharge status: Home / Self Care | Payer: Medicaid Other | Source: Ambulatory Visit | Attending: Radiation Oncology | Admitting: Radiation Oncology

## 2012-02-07 VITALS — BP 128/83 | HR 92 | Temp 98.6°F | Wt 245.1 lb

## 2012-02-07 DIAGNOSIS — Z853 Personal history of malignant neoplasm of breast: Secondary | ICD-10-CM

## 2012-02-07 NOTE — Progress Notes (Signed)
1 year FU appointment.  C/o increased pain in her right breast for ~ 1 month with associated "shooting" pain from her shoulder which radiates to her right breast causes pain and tenderness.  Also c/o of  intermittent sensation "like a cramp" in her breast.  Note swelling of right upper extremity with tenderness.  Reports that she she was diagnosed with Costochondritis 1 month ago which resulting in "severe" chest pain.

## 2012-02-07 NOTE — Progress Notes (Signed)
Radiation Oncology         [phone] ________________________________  Name: Ashley Lindsey MRN: [id number]  Date: 02/07/2012  DOB: [birth year]  Follow-Up Visit Note  CC: Alva Garnet., MD  Ernestene Mention, MD  Diagnosis:   Bilateral breast cancer  Interval Since Last Radiation:  One year and 4 months . She most recently received radiation therapy to the right breast for intraductal breast  Narrative:  The patient returns today for routine follow-up.  She continues to have a lot of pain complaints. She primarily has pain in the right shoulder and arm area. She's also had problems with lymphedema along the right side. She has been seen in the lymphedema clinic and is recommended she be fitted for a sleeve.  She also has diffuse myalgias and arthralgias. She is been unable to walk for exercise as this  causes significant swelling in her knees.  She has been doing water aerobics which seems to help with her stiffness.  She has gained approximately 50 pounds over the past year since she is unable to exercise on a regular basis.  She is on permanent disability in light of her above issues.                           ALLERGIES:  is allergic to penicillins.  Meds: Current Outpatient Prescriptions  Medication Sig Dispense Refill  . ALPRAZolam (XANAX PO) Take 1 mg by mouth 3 (three) times daily as needed. For anxiety       . butalbital-aspirin-caffeine (FIORINAL) 50-325-40 MG per capsule Take 1 capsule by mouth 3 (three) times daily as needed. For headache      . cyclobenzaprine (FLEXERIL) 10 MG tablet Take 10 mg by mouth 3 (three) times daily as needed. For muscle spasms      . esomeprazole (NEXIUM) 40 MG capsule Take 40 mg by mouth daily before breakfast.      . fish oil-omega-3 fatty acids 1000 MG capsule Take 1 capsule by mouth daily with breakfast.       . hydrochlorothiazide (HYDRODIURIL) 25 MG tablet Take 25 mg by mouth.      Marland Kitchen ibuprofen (ADVIL,MOTRIN) 800 MG tablet Take 800 mg  by mouth every 8 (eight) hours as needed. For pain      . magnesium oxide (MAG-OX) 400 MG tablet Take 400 mg by mouth daily with breakfast.       . Multiple Vitamin (MULTIVITAMIN) tablet Take 1 tablet by mouth daily with breakfast.       . naproxen sodium (ALEVE) 220 MG tablet Take 220 mg by mouth 2 (two) times daily with a meal.      . OVER THE COUNTER MEDICATION TYLENOL CREAM BID PRN TOPICALLY      . pregabalin (LYRICA) 75 MG capsule Take 1 capsule (75 mg total) by mouth 2 (two) times daily.  60 capsule  1  . Probiotic Product (ALIGN PO) Take 1 capsule by mouth at bedtime.       . senna (SENOKOT) 8.6 MG tablet Take 1 tablet by mouth daily.      . Vitamin D, Ergocalciferol, (DRISDOL) 50000 UNITS CAPS Take 50,000 Units by mouth 2 (two) times a week. Monday and Thursday      . lisinopril (PRINIVIL,ZESTRIL) 10 MG tablet Take 10 mg by mouth daily.      . potassium chloride (K-DUR) 10 MEQ tablet Take 10 mEq by mouth daily with breakfast.  Physical Findings: The patient is in no acute distress. Patient is alert and oriented.  weight is 245 lb 1.6 oz (111.177 kg). Her temperature is 98.6 F (37 C). Her blood pressure is 128/83 and her pulse is 92. Marland Kitchen  No palpable cervical supraclavicular or axillary adenopathy. The lungs are clear to auscultation. The heart has a regular rhythm and rate examination.   left breast reveals mild radiation changes. There is no dominant mass appreciated breast nipple discharge or bleeding. Patient appears to have fibrocystic changes in the upper central aspect of the breast which is mildly tender with palpation.  Examination right breast reveals some hyperpigmentation changes. The breast is softened up quite a bit. There is no dominant mass appreciated in the right breast. She is tender with palpation throughout the breast but more so in the upper outer aspect of the breast towards the axilla. She is tender with palpation along the right shoulder and upper arm region.  There's no obvious palpable cords.   she does have some lymphedema in the right arm compared to the left side.   she is only able to abduct her right arm to approximately 80 degrees  Lab Findings: Lab Results  Component Value Date   WBC 8.2 12/26/2011   HGB 12.8 12/26/2011   HCT 39.4 12/26/2011   MCV 86.0 12/26/2011   PLT 220 12/26/2011    @LASTCHEM @  Radiographic Findings: No results found.  Impression:  No evidence of recurrence on clinical exam today. As above she has multiple issues which have resulted in permanent disability.  She will likely need to see rheumatologist in light of her progressive problems with what seems to be fibromyalgia.  This issue has been discussed with the patient by her primary care physician  Plan:  She will undergo an MRI of the chest/breast area in November and followup with Dr. Derrell Lolling afterward. Patient continues close followup with Dr. Donnie Coffin. She scheduled for routine followup in radiation oncology in 6 months  _____________________________________   Billie Lade, PhD, MD

## 2012-02-15 ENCOUNTER — Encounter (INDEPENDENT_AMBULATORY_CARE_PROVIDER_SITE_OTHER): Payer: Self-pay | Admitting: General Surgery

## 2012-02-15 NOTE — Progress Notes (Signed)
Faxed signed authorization to [facility] fax # [phone] for post surgical bras and silicone prosthesis. Confirmation received.

## 2012-03-01 ENCOUNTER — Emergency Department (HOSPITAL_COMMUNITY)
Admission: EM | Admit: 2012-03-01 | Discharge: 2012-03-01 | Disposition: A | Discharge status: Home / Self Care | Payer: Medicaid Other | Attending: Emergency Medicine | Admitting: Emergency Medicine

## 2012-03-01 ENCOUNTER — Encounter (HOSPITAL_COMMUNITY): Payer: Self-pay | Admitting: *Deleted

## 2012-03-01 ENCOUNTER — Emergency Department (HOSPITAL_COMMUNITY): Payer: Medicaid Other

## 2012-03-01 DIAGNOSIS — Z853 Personal history of malignant neoplasm of breast: Secondary | ICD-10-CM | POA: Insufficient documentation

## 2012-03-01 DIAGNOSIS — Z8679 Personal history of other diseases of the circulatory system: Secondary | ICD-10-CM | POA: Insufficient documentation

## 2012-03-01 DIAGNOSIS — M549 Dorsalgia, unspecified: Secondary | ICD-10-CM | POA: Insufficient documentation

## 2012-03-01 DIAGNOSIS — M129 Arthropathy, unspecified: Secondary | ICD-10-CM | POA: Insufficient documentation

## 2012-03-01 DIAGNOSIS — F17200 Nicotine dependence, unspecified, uncomplicated: Secondary | ICD-10-CM | POA: Insufficient documentation

## 2012-03-01 DIAGNOSIS — N644 Mastodynia: Secondary | ICD-10-CM

## 2012-03-01 DIAGNOSIS — R110 Nausea: Secondary | ICD-10-CM | POA: Insufficient documentation

## 2012-03-01 DIAGNOSIS — Z923 Personal history of irradiation: Secondary | ICD-10-CM | POA: Insufficient documentation

## 2012-03-01 DIAGNOSIS — Z8739 Personal history of other diseases of the musculoskeletal system and connective tissue: Secondary | ICD-10-CM | POA: Insufficient documentation

## 2012-03-01 HISTORY — DX: Chondrocostal junction syndrome (tietze): M94.0

## 2012-03-01 LAB — COMPREHENSIVE METABOLIC PANEL
ALT: 16 U/L (ref 0–35)
AST: 19 U/L (ref 0–37)
Albumin: 3.4 g/dL — ABNORMAL LOW (ref 3.5–5.2)
Alkaline Phosphatase: 86 U/L (ref 39–117)
BUN: 12 mg/dL (ref 6–23)
CO2: 25 mEq/L (ref 19–32)
Calcium: 8.8 mg/dL (ref 8.4–10.5)
Chloride: 101 mEq/L (ref 96–112)
Creatinine, Ser: 0.68 mg/dL (ref 0.50–1.10)
GFR calc Af Amer: >90 mL/min (ref >90)
GFR calc non Af Amer: >90 mL/min (ref >90)
Glucose, Bld: 96 mg/dL (ref 70–99)
Potassium: 4 mEq/L (ref 3.5–5.1)
Sodium: 135 mEq/L (ref 135–145)
Total Bilirubin: 0.1 mg/dL — ABNORMAL LOW (ref 0.3–1.2)
Total Protein: 6.7 g/dL (ref 6.0–8.3)

## 2012-03-01 LAB — CBC
HCT: 37.6 % (ref 36.0–46.0)
Hemoglobin: 12.1 g/dL (ref 12.0–15.0)
MCH: 27.6 pg (ref 26.0–34.0)
MCHC: 32.2 g/dL (ref 30.0–36.0)
MCV: 85.6 fL (ref 78.0–100.0)
Platelets: 311 10*3/uL (ref 150–400)
RBC: 4.39 MIL/uL (ref 3.87–5.11)
RDW: 14 % (ref 11.5–15.5)
WBC: 7.2 10*3/uL (ref 4.0–10.5)

## 2012-03-01 MED ORDER — HYDROCODONE-ACETAMINOPHEN 5-325 MG PO TABS
1.0000 | ORAL_TABLET | Freq: Once | ORAL | Status: AC
  Administered 2012-03-01: 1 via ORAL
  Filled 2012-03-01: qty 1

## 2012-03-01 MED ORDER — MORPHINE SULFATE 4 MG/ML IJ SOLN
4.0000 mg | Freq: Once | INTRAMUSCULAR | Status: AC
  Administered 2012-03-01: 4 mg via INTRAVENOUS
  Filled 2012-03-01: qty 1

## 2012-03-01 MED ORDER — DIAZEPAM 5 MG PO TABS: 5.0000 mg | ORAL_TABLET | Freq: Three times a day (TID) | ORAL | Status: DC | PRN

## 2012-03-01 MED ORDER — ONDANSETRON HCL 4 MG/2ML IJ SOLN
4.0000 mg | Freq: Once | INTRAMUSCULAR | Status: AC
  Administered 2012-03-01: 4 mg via INTRAVENOUS
  Filled 2012-03-01: qty 2

## 2012-03-01 MED ORDER — DIAZEPAM 5 MG PO TABS
5.0000 mg | ORAL_TABLET | Freq: Once | ORAL | Status: AC
  Administered 2012-03-01: 5 mg via ORAL
  Filled 2012-03-01: qty 1

## 2012-03-01 MED ORDER — OXYCODONE-ACETAMINOPHEN 5-325 MG PO TABS: 1.0000 | ORAL_TABLET | Freq: Four times a day (QID) | ORAL | Status: DC | PRN

## 2012-03-01 NOTE — ED Notes (Signed)
Pt did not come in with an incision.  Abdomen assessed and charted.

## 2012-03-01 NOTE — ED Notes (Signed)
Gave patient a cup of ice per nurse

## 2012-03-01 NOTE — ED Provider Notes (Signed)
Medical screening examination/treatment/procedure(s) were performed by non-physician practitioner and as supervising physician I was immediately available for consultation/collaboration.   Lane Eland, MD 03/01/12 1510 

## 2012-03-01 NOTE — ED Notes (Signed)
Pt returned from x-ray with c/o increased nausea and no relief from pain.  Orders for zofran and morphine obtained.

## 2012-03-01 NOTE — ED Notes (Signed)
Pt states she began having right chest pain this morning @ 2 am.  Pt states pain is cramping in nature, "like somebody is balling up their fist."  Pt also c/o nausea.  Pt denies SOB, but states the "pain takes my breath away." Pt states pain is intermittent, aching in nature and radiates to her back and right arm.  Pt took 2 flexeril to treat pain with no relief.  Pt denies injury to area. Pt states laying flat makes pain worse.

## 2012-03-01 NOTE — ED Provider Notes (Signed)
History     CSN: [id number]  Arrival date & time 03/01/12  [id number]   First MD Initiated Contact with Patient 03/01/12 0745      Chief Complaint  Patient presents with  . Chest Pain    right chest pain    (Consider location/radiation/quality/duration/timing/severity/associated sxs/prior treatment) HPI The patient presents to the emergency department with right breast pain, that radiates to her right arm for the last 8 hours.  Patient, states she's had this pain in the past, but had an increase this morning.  Patient, states that normally, muscle relaxants help with the discomfort, but today it did not seem to help.  She says she's had this pain ever since radiation for her right breast mass.  Patient denies shortness of breath, vomiting, weakness, headache, fever, abdominal pain, neck pain, or dizziness.  Patient, states she does have some mild nausea, and some mild back discomfort that radiates from her breast.  Patient also denies cough.  Palpation and movement seem to make the pain, worse. Past Medical History  Diagnosis Date  . Abdominal cramps   . Muscle spasms of head and/or neck     following to the breast  . Fatigue   . Passed out     4th of july  . Migraines   . Chronic headaches   . Arthritis   . Cancer     s/p radiation- last dose 6/12//has been off Tamoxifen 8/12  . Sleep apnea     STOP BANG SCORE 5  . Lymphedema     RIGHT ARM-////  STATES USE LEFT ARM FOR BP'S  . S/P radiation therapy 08/19/07 - 10/10/07    Left Breast/5040 cGy/28 fractions with Boost for a toatl dose of 6300 dGy  . S/P radiation therapy 08/14/10 -10/03/10    right breast  . Costochondritis     Past Surgical History  Procedure Date  . Breast lumpectomy 2009/2012    LEFT/RIGHT with lymph node dissection  . Abdominal hysterectomy     with left salpingooophorectomy  . Knee arthroscopy     right  . Oophorectomy 2013    Family History  Problem Relation Age of Onset  . Breast cancer Mother   .  Heart disease Father   . Cervical cancer Paternal Aunt   . Prostate cancer Maternal Uncle     History  Substance Use Topics  . Smoking status: Current Every Day Smoker -- 0.2 packs/day for 1 years    Types: Cigarettes  . Smokeless tobacco: Not on file  . Alcohol Use: Yes     Comment: 3 x a week    OB History    Grav Para Term Preterm Abortions TAB SAB Ect Mult Living                  Review of Systems All other systems negative except as documented in the HPI. All pertinent positives and negatives as reviewed in the HPI.  Allergies  Penicillins  Home Medications   Current Outpatient Rx  Name  Route  Sig  Dispense  Refill  . ALPRAZOLAM 1 MG PO TABS   Oral   Take 1 mg by mouth 3 (three) times daily as needed. anxiety         . BUTALBITAL-ASA-CAFFEINE 50-325-40 MG PO CAPS   Oral   Take 1 capsule by mouth 3 (three) times daily as needed. For headache         . CYCLOBENZAPRINE HCL 10 MG PO TABS  Oral   Take 10 mg by mouth 3 (three) times daily as needed. For muscle spasms         . ESOMEPRAZOLE MAGNESIUM 40 MG PO CPDR   Oral   Take 40 mg by mouth daily before breakfast.         . OMEGA-3 FATTY ACIDS 1000 MG PO CAPS   Oral   Take 1 capsule by mouth daily with breakfast.          . HYDROCHLOROTHIAZIDE 25 MG PO TABS   Oral   Take 25 mg by mouth daily before breakfast.          . IBUPROFEN 800 MG PO TABS   Oral   Take 800 mg by mouth every 8 (eight) hours as needed. For pain         . LISINOPRIL 10 MG PO TABS   Oral   Take 10 mg by mouth daily.         Marland Kitchen MAGNESIUM OXIDE 400 MG PO TABS   Oral   Take 400 mg by mouth daily with breakfast.          . ONE-DAILY MULTI VITAMINS PO TABS   Oral   Take 1 tablet by mouth daily with breakfast.          . NAPROXEN SODIUM 220 MG PO TABS   Oral   Take 220 mg by mouth 2 (two) times daily with a meal.         . OVER THE COUNTER MEDICATION   Topical   Apply 1 application topically 2 (two) times  daily. TYLENOL CREAM TOPICALLY         . POTASSIUM CHLORIDE ER 10 MEQ PO TBCR   Oral   Take 10 mEq by mouth daily with breakfast.          . PREGABALIN 75 MG PO CAPS   Oral   Take 1 capsule (75 mg total) by mouth 2 (two) times daily.   60 capsule   1   . ALIGN PO   Oral   Take 1 capsule by mouth at bedtime.          . SENNOSIDES 8.6 MG PO TABS   Oral   Take 1 tablet by mouth daily.           BP 106/71  Pulse 81  Temp 98.2 F (36.8 C) (Oral)  Resp 16  SpO2 100%  Physical Exam  Nursing note and vitals reviewed. Constitutional: She is oriented to person, place, and time. She appears well-developed and well-nourished.  HENT:  Head: Normocephalic and atraumatic.  Mouth/Throat: Oropharynx is clear and moist.  Cardiovascular: Normal rate, regular rhythm and normal heart sounds.  Exam reveals no gallop and no friction rub.   No murmur heard. Pulmonary/Chest: Effort normal and breath sounds normal. No respiratory distress. She has no wheezes. She has no rales. She exhibits tenderness. Right breast exhibits tenderness. Right breast exhibits no inverted nipple, no mass, no nipple discharge and no skin change. Left breast exhibits no inverted nipple, no mass, no nipple discharge, no skin change and no tenderness. Breasts are symmetrical.    Neurological: She is alert and oriented to person, place, and time.  Skin: Skin is warm and dry. No rash noted. No erythema.    ED Course  Procedures (including critical care time)  Labs Reviewed  COMPREHENSIVE METABOLIC PANEL - Abnormal; Notable for the following:    Albumin 3.4 (*)     Total  Bilirubin 0.1 (*)     All other components within normal limits  CBC   Dg Chest 2 View  03/01/2012  *RADIOLOGY REPORT*  Clinical Data: Chest pain, history of breast cancer  CHEST - 2 VIEW  Comparison: 12/23/2011  Findings: Cardiomediastinal silhouette is stable.  No acute infiltrate or pulmonary edema.  Stable minimal central bronchitic  changes. Mild degenerative changes thoracic spine.  IMPRESSION: No active disease.  No significant change.   Original Report Authenticated By: Natasha Mead, M.D.     Patient was rechecked and has relief of her discomfort at this time.  Patient does not have any signs of breast abscess or mass.  Patient be treated with pain control and referred back to her primary care doctor for further evaluation.  She is advised to use heat on the area as well.    MDM  MDM Reviewed: vitals and nursing note Interpretation: labs and x-ray            Carlyle Dolly, PA-C 03/01/12 1147

## 2012-03-01 NOTE — ED Notes (Addendum)
Pt states she began having right chest/breast pain in her breast radiating to right arm this morning @ 2.  She states pain has increased in intensity, despite taking 2 Flexeril and a Xanax.  Pt states pain is cramping/aching in nature, radiates to back and right arm and is located under right breast and right axilla.  Pt states pain is worse with inspiration, so she is reluctant to take a deep breath, but pt denies SOB and dizziness.  Pt c/o nausea which started upon arrival to ED, but denies vomiting.  Pt has hx of costochondritis and lumpectomy x2 to right breast.  Pt's lung sounds clear in all lobes.  No edema noted to extremities.  Surgical incisions from 07/2011 completely healed.  Pt believes she has swelling under right breast, but none is noted on exam.

## 2012-03-04 ENCOUNTER — Telehealth (INDEPENDENT_AMBULATORY_CARE_PROVIDER_SITE_OTHER): Payer: Self-pay | Admitting: General Surgery

## 2012-03-04 NOTE — Telephone Encounter (Signed)
Ramona from [facility] called saying that they need an Rx  for post surgical bras and silicone prosthesis.  The Rx needs to also have on there "Leisure Forms".  This is for Medicaid purposes.  Please Fax to [phone].

## 2012-03-05 ENCOUNTER — Encounter (INDEPENDENT_AMBULATORY_CARE_PROVIDER_SITE_OTHER): Payer: Self-pay | Admitting: General Surgery

## 2012-03-05 NOTE — Progress Notes (Signed)
Faxed signed prescription for post surgical bras and silicone prothesis, leisure forms for patient. Faxed to [facility] [phone], confirmation received. Copy of prescription sent to medical records to be scanned into chart.

## 2012-03-13 ENCOUNTER — Other Ambulatory Visit: Payer: Medicaid Other | Admitting: Lab

## 2012-03-13 ENCOUNTER — Encounter: Payer: Medicaid Other | Admitting: Genetic Counselor

## 2012-03-14 ENCOUNTER — Other Ambulatory Visit: Payer: Self-pay | Admitting: Emergency Medicine

## 2012-03-14 ENCOUNTER — Telehealth: Payer: Self-pay | Admitting: *Deleted

## 2012-03-14 ENCOUNTER — Encounter: Payer: Self-pay | Admitting: Oncology

## 2012-03-14 MED ORDER — PREGABALIN 75 MG PO CAPS: 75.0000 mg | ORAL_CAPSULE | Freq: Two times a day (BID) | ORAL | Status: DC

## 2012-03-14 NOTE — Telephone Encounter (Signed)
Left voice message to inform the patient of the new date and time on 04-21-2012 md only 04-14-2012 lab only

## 2012-03-14 NOTE — Progress Notes (Signed)
Called [employer] @ [phone] for lyrica 75mg  refill; will be shipped to patient's home within 7-10 days.  She does not have any more refills, faxed a new prescription for December.  Eligibility ends 05/08/12 she will need to fill out a new application.

## 2012-03-17 ENCOUNTER — Encounter (INDEPENDENT_AMBULATORY_CARE_PROVIDER_SITE_OTHER): Payer: Self-pay | Admitting: General Surgery

## 2012-03-17 NOTE — Progress Notes (Signed)
Faxed signed authorization from Dr. Derrell Lolling to [facility] fax # [phone] for bilateral silicone breast prosthesis and bilateral mastectomy forms (replacement). Confirmation received. Sent requests to medical records to be scanned as orders.

## 2012-03-28 ENCOUNTER — Other Ambulatory Visit: Payer: Medicaid Other | Admitting: Lab

## 2012-04-04 ENCOUNTER — Other Ambulatory Visit: Payer: Medicaid Other | Admitting: Lab

## 2012-04-04 ENCOUNTER — Ambulatory Visit: Payer: Medicaid Other | Admitting: Oncology

## 2012-04-11 ENCOUNTER — Other Ambulatory Visit: Payer: Self-pay | Admitting: *Deleted

## 2012-04-11 DIAGNOSIS — Z853 Personal history of malignant neoplasm of breast: Secondary | ICD-10-CM

## 2012-04-14 ENCOUNTER — Other Ambulatory Visit: Payer: Medicaid Other | Admitting: Lab

## 2012-04-14 ENCOUNTER — Telehealth: Payer: Self-pay | Admitting: *Deleted

## 2012-04-14 NOTE — Telephone Encounter (Signed)
Patient called to move her lab only appointment to 2:15 same day different time

## 2012-04-21 ENCOUNTER — Other Ambulatory Visit (HOSPITAL_BASED_OUTPATIENT_CLINIC_OR_DEPARTMENT_OTHER): Payer: Medicaid Other | Admitting: *Deleted

## 2012-04-21 ENCOUNTER — Telehealth: Payer: Self-pay | Admitting: *Deleted

## 2012-04-21 ENCOUNTER — Ambulatory Visit (HOSPITAL_BASED_OUTPATIENT_CLINIC_OR_DEPARTMENT_OTHER): Payer: Medicaid Other | Admitting: Oncology

## 2012-04-21 VITALS — BP 124/85 | HR 99 | Temp 98.0°F | Resp 20 | Ht 62.0 in | Wt 243.4 lb

## 2012-04-21 DIAGNOSIS — Z853 Personal history of malignant neoplasm of breast: Secondary | ICD-10-CM

## 2012-04-21 DIAGNOSIS — Z170 Estrogen receptor positive status [ER+]: Secondary | ICD-10-CM

## 2012-04-21 DIAGNOSIS — Z23 Encounter for immunization: Secondary | ICD-10-CM

## 2012-04-21 DIAGNOSIS — C50419 Malignant neoplasm of upper-outer quadrant of unspecified female breast: Secondary | ICD-10-CM

## 2012-04-21 MED ORDER — INFLUENZA VIRUS VACC SPLIT PF IM SUSP
0.5000 mL | Freq: Once | INTRAMUSCULAR | Status: AC
Start: 2012-04-21 — End: 2012-04-21
  Administered 2012-04-21: 0.5 mL via INTRAMUSCULAR
  Filled 2012-04-21: qty 0.5

## 2012-04-21 MED ORDER — DULOXETINE HCL 20 MG PO CPEP: 20.0000 mg | ORAL_CAPSULE | Freq: Every day | ORAL | Status: DC

## 2012-04-21 NOTE — Addendum Note (Signed)
Addended by: Donnelly Angelica on: 04/21/2012 01:35 PM   Modules accepted: Orders

## 2012-04-21 NOTE — Telephone Encounter (Signed)
Gave patient instructions on getting her 2014 appointment 

## 2012-04-21 NOTE — Progress Notes (Signed)
Hematology and Oncology Follow Up Visit  Ashley Lindsey [id number] 11/05/1960 51 y.o. 04/21/2012 12:39 PM PCP Dr Tamela Oddi  Principle Diagnosis: 51 year old woman with history of a T1 B. tubular cancer associated DCIS, ER/PR positive, status post lumpectomy and sentinel lymph node evaluation 07/21/2007, status post completion of radiation, on tamoxifen. Recent diagnosis of DCIS in the right breast status post lumpectomy and radiation therapy completed February 2013 Status post oophorectomy.4/13 S/p genetic testing 10/13- results pending.  Interim History:  There have been no intercurrent illness, hospitalizations or medication changes. She continues on multiple medications include Lyrica Ultram Toradol Naprosyn , fiorinal  all as well as Xanax. She is doing a bit better on low dose of Lyrica. . She's had no other intercurrent illnesses. She continues to have arthralgias and myalgias as well as h/a. She is having syncopal episodes as well as severe hot flashes.  Medications: I have reviewed the patient's current medications.  Allergies:  Allergies  Allergen Reactions  . Penicillins Hives and Swelling    Past Medical History, Surgical history, Social history, and Family History were reviewed and updated.  Review of Systems: Constitutional:  Negative for fever, chills, night sweats, anorexia, weight loss, pain. Cardiovascular: no chest pain or dyspnea on exertion Respiratory: no cough, shortness of breath, or wheezing Neurological: negative Dermatological: negative ENT: negative Skin Gastrointestinal: negative Genito-Urinary: negative Hematological and Lymphatic: negative Breast: negative Musculoskeletal: negative Remaining ROS negative.  Physical Exam: Blood pressure 124/85, pulse 99, temperature 98 F (36.7 C), resp. rate 20, height 5\' 2"  (1.575 m), weight 243 lb 6.4 oz (110.406 kg). ECOG: 1 General appearance: alert, cooperative and appears stated age Head:  Normocephalic, without obvious abnormality, atraumatic Neck: no adenopathy, no carotid bruit, no JVD, supple, symmetrical, trachea midline and thyroid not enlarged, symmetric, no tenderness/mass/nodules Lymph nodes: Cervical, supraclavicular, and axillary nodes normal. Cardiac : regular rate and rhythm, no murmurs or gallops Pulmonary:clear to auscultation bilaterally and normal percussion bilaterally Breasts: inspection negative, no nipple discharge or bleeding, no masses or nodularity palpable, she is status post bilateral lumpectomies, both scars have healed well. There is no evidence of local recurrence Abdomen:soft, non-tender; bowel sounds normal; no masses,  no organomegaly Extremities negative Neuro: alert, oriented, normal speech, no focal findings or movement disorder noted  Lab Results: Lab Results  Component Value Date   WBC 7.2 03/01/2012   HGB 12.1 03/01/2012   HCT 37.6 03/01/2012   MCV 85.6 03/01/2012   PLT 311 03/01/2012     Chemistry      Component Value Date/Time   NA 135 03/01/2012 0800   NA 139 12/26/2011 1538   K 4.0 03/01/2012 0800   K 4.4 12/26/2011 1538   CL 101 03/01/2012 0800   CL 105 12/26/2011 1538   CO2 25 03/01/2012 0800   CO2 25 12/26/2011 1538   BUN 12 03/01/2012 0800   BUN 12.0 12/26/2011 1538   CREATININE 0.68 03/01/2012 0800   CREATININE 0.7 12/26/2011 1538      Component Value Date/Time   CALCIUM 8.8 03/01/2012 0800   CALCIUM 9.3 12/26/2011 1538   ALKPHOS 86 03/01/2012 0800   ALKPHOS 106 12/26/2011 1538   AST 19 03/01/2012 0800   AST 16 12/26/2011 1538   ALT 16 03/01/2012 0800   ALT 22 12/26/2011 1538   BILITOT 0.1* 03/01/2012 0800   BILITOT <0.20 Repeated and Verified 12/26/2011 1538      .pathology. Radiological Studies: chest X-ray n/a Mammogram N/a due 1/14 Bone density n/a  Impression and  Plan: Patient is doing well. She has bilateral breast cancers. She is also status post oophorectomy as such menopausal. She has not been initiated on an AI. She needs a  lot of joint and bone pain. I am therefore reluctant to do so. Her genetic testing results are pending. . I have recommended that she try a course of Cymbalta. . I will try to reevaluate her in 3 months time to see how she's doing. She wil have a mammogram in 1/14.  More than 50% of the visit was spent in patient-related counselling   Pierce Crane, MD 12/30/201312:39 PM

## 2012-05-06 ENCOUNTER — Encounter (HOSPITAL_BASED_OUTPATIENT_CLINIC_OR_DEPARTMENT_OTHER): Payer: Medicaid Other

## 2012-05-19 ENCOUNTER — Telehealth: Payer: Self-pay | Admitting: *Deleted

## 2012-05-19 NOTE — Telephone Encounter (Signed)
Darlena stating that the pt called needing an appt because she is sick.  Called pt to see if she could come in today and she said that she is going to go see Dr. Derrell Lolling.  She said that she would call me back to get scheduled w/ Dr. Welton Flakes after she sees Dr. Derrell Lolling.

## 2012-05-21 ENCOUNTER — Telehealth: Payer: Self-pay | Admitting: *Deleted

## 2012-05-21 ENCOUNTER — Encounter: Payer: Self-pay | Admitting: Oncology

## 2012-05-21 ENCOUNTER — Telehealth: Payer: Self-pay | Admitting: Oncology

## 2012-05-21 NOTE — Telephone Encounter (Signed)
Pt called today re appt w/KK. Message given to [place]. Pt aware Misty Stanley will call her re appt.

## 2012-05-21 NOTE — Telephone Encounter (Signed)
Pt called stating that she spoke with Dr. Jacinto Halim nurse and they recommended her to see Dr. Welton Flakes.  Confirmed 06/03/12 appt w/ pt.   Mailed letter & calendar to pt.

## 2012-05-30 ENCOUNTER — Telehealth: Payer: Self-pay | Admitting: *Deleted

## 2012-05-30 NOTE — Telephone Encounter (Signed)
Pt called stating that she is in pain and would like to speak with a nurse Deanna Artis if available).  Took information to [place] to call pt.

## 2012-06-03 ENCOUNTER — Ambulatory Visit: Payer: Medicaid Other | Admitting: Gynecologic Oncology

## 2012-06-03 ENCOUNTER — Telehealth: Payer: Self-pay | Admitting: Oncology

## 2012-06-03 NOTE — Telephone Encounter (Signed)
pt called to r/s appt March.Marland KitchenMarland KitchenDone

## 2012-06-10 ENCOUNTER — Ambulatory Visit (HOSPITAL_BASED_OUTPATIENT_CLINIC_OR_DEPARTMENT_OTHER): Payer: Medicaid Other | Attending: Internal Medicine | Admitting: Radiology

## 2012-06-10 VITALS — Ht 62.0 in | Wt 236.0 lb

## 2012-06-10 DIAGNOSIS — G4710 Hypersomnia, unspecified: Secondary | ICD-10-CM | POA: Insufficient documentation

## 2012-06-10 DIAGNOSIS — G4733 Obstructive sleep apnea (adult) (pediatric): Secondary | ICD-10-CM

## 2012-06-14 DIAGNOSIS — G4730 Sleep apnea, unspecified: Secondary | ICD-10-CM

## 2012-06-14 DIAGNOSIS — G4710 Hypersomnia, unspecified: Secondary | ICD-10-CM

## 2012-06-14 NOTE — Procedures (Signed)
NAME:  Ashley Lindsey, Ashley Lindsey                ACCOUNT NO.:  [id number]  MEDICAL RECORD NO.:  [id number]          PATIENT TYPE:  OUT  LOCATION:  SLEEP CENTER                 FACILITY:  [facility]  PHYSICIAN:  Joban Colledge D. Maple Hudson, MD, FCCP, FACPDATE OF BIRTH:  August 17, 1960  DATE OF STUDY:  06/10/2012                           NOCTURNAL POLYSOMNOGRAM  REFERRING PHYSICIAN:  Merlene Laughter. Renae Gloss, M.D.  INDICATION FOR STUDY:  Hypersomnia with sleep apnea.  EPWORTH SLEEPINESS SCORE:  7/24.  BMI 43.2, weight 236 pounds, height 62 inches, neck 14.5 inches.  MEDICATIONS:  Home medications charted and reviewed.  A baseline diagnostic NPSG on November 22, 2011, had recorded an AHI of 16.1/hr.  Body weight was 239 pounds for that study.  CPAP titration is now requested.  SLEEP ARCHITECTURE:  Total sleep time 300 minutes with sleep efficiency 73.5%.  Stage I was 20.2%, stage II 55.7%, stage III absent, REM 24.2% of total sleep time.  Sleep latency 31 minutes.  REM latency 201 minutes.  Awake after sleep onset 76.5 minutes.  Arousal index 9.6. Bedtime medication:  None.  RESPIRATORY DATA:  CPAP titration protocol.  CPAP was titrated to 6 CWP, AHI 0/hr.  She wore small nasal pillows with a Materials engineer.  Heated humidifier.  OXYGEN DATA:  Snoring was prevented and mean oxygen saturation held 94.6% on room air.  CARDIAC DATA:  Normal sinus rhythm.  MOVEMENT/PARASOMNIA:  No significant movement disturbance. Bathroom x2.  IMPRESSION/RECOMMENDATIONS: 1. Successful CPAP titration to 6 CWP, AHI 0/hr.  She used small nasal     pillows with a small ResMed Mirage Liberty Mask and heated     humidifier.  Snoring was prevented and mean oxygen saturation held     94.6% on room air. 2. Baseline diagnostic NPSG on November 22, 2011, had recorded AHI     16.1/hr.  Body weight for that study was 239 pounds.     [name] D. Maple Hudson, MD, [facility], FACP Diplomate, American Board of Sleep Medicine   CDY/MEDQ  D:   06/14/2012 15:35:12  T:  06/14/2012 20:59:20  Job:  [id number]

## 2012-06-16 ENCOUNTER — Other Ambulatory Visit: Payer: Self-pay | Admitting: *Deleted

## 2012-06-16 ENCOUNTER — Telehealth: Payer: Self-pay | Admitting: *Deleted

## 2012-06-16 DIAGNOSIS — Z853 Personal history of malignant neoplasm of breast: Secondary | ICD-10-CM

## 2012-06-16 NOTE — Telephone Encounter (Signed)
Received call from patient stating her left arm is swollen down to her fingertips and painful.  Scheduled her for a doppler/US of left upper extremity for tomorrow 06/17/12 at 10am at [facility].  Patient is aware of appt. Offered her appt. Today at [facility] but she does want to go today.  Informed her that if her symptoms should worsen to go to ED.  Pt. Verbalized understanding.

## 2012-06-17 ENCOUNTER — Emergency Department (HOSPITAL_COMMUNITY): Payer: Medicaid Other

## 2012-06-17 ENCOUNTER — Encounter (HOSPITAL_COMMUNITY): Payer: Self-pay | Admitting: Emergency Medicine

## 2012-06-17 ENCOUNTER — Emergency Department (HOSPITAL_COMMUNITY)
Admission: EM | Admit: 2012-06-17 | Discharge: 2012-06-17 | Disposition: A | Discharge status: Home / Self Care | Payer: Medicaid Other | Attending: Emergency Medicine | Admitting: Emergency Medicine

## 2012-06-17 ENCOUNTER — Ambulatory Visit (HOSPITAL_COMMUNITY): Payer: Medicaid Other | Attending: Oncology

## 2012-06-17 ENCOUNTER — Other Ambulatory Visit: Payer: Self-pay

## 2012-06-17 DIAGNOSIS — Z8739 Personal history of other diseases of the musculoskeletal system and connective tissue: Secondary | ICD-10-CM | POA: Insufficient documentation

## 2012-06-17 DIAGNOSIS — M79602 Pain in left arm: Secondary | ICD-10-CM

## 2012-06-17 DIAGNOSIS — Z79899 Other long term (current) drug therapy: Secondary | ICD-10-CM | POA: Insufficient documentation

## 2012-06-17 DIAGNOSIS — M7989 Other specified soft tissue disorders: Secondary | ICD-10-CM

## 2012-06-17 DIAGNOSIS — F17200 Nicotine dependence, unspecified, uncomplicated: Secondary | ICD-10-CM | POA: Insufficient documentation

## 2012-06-17 DIAGNOSIS — M79609 Pain in unspecified limb: Secondary | ICD-10-CM

## 2012-06-17 DIAGNOSIS — G4730 Sleep apnea, unspecified: Secondary | ICD-10-CM | POA: Insufficient documentation

## 2012-06-17 DIAGNOSIS — Z923 Personal history of irradiation: Secondary | ICD-10-CM | POA: Insufficient documentation

## 2012-06-17 DIAGNOSIS — Z8589 Personal history of malignant neoplasm of other organs and systems: Secondary | ICD-10-CM | POA: Insufficient documentation

## 2012-06-17 LAB — BASIC METABOLIC PANEL
BUN: 14 mg/dL (ref 6–23)
CO2: 27 mEq/L (ref 19–32)
Calcium: 8.9 mg/dL (ref 8.4–10.5)
Chloride: 104 mEq/L (ref 96–112)
Creatinine, Ser: 0.62 mg/dL (ref 0.50–1.10)
GFR calc Af Amer: >90 mL/min (ref >90)
GFR calc non Af Amer: >90 mL/min (ref >90)
Glucose, Bld: 103 mg/dL — ABNORMAL HIGH (ref 70–99)
Potassium: 3.7 mEq/L (ref 3.5–5.1)
Sodium: 139 mEq/L (ref 135–145)

## 2012-06-17 LAB — TROPONIN I: Troponin I: <0.3 ng/mL (ref <0.30)

## 2012-06-17 MED ORDER — IBUPROFEN 600 MG PO TABS: 600.0000 mg | ORAL_TABLET | Freq: Three times a day (TID) | ORAL | Status: DC | PRN

## 2012-06-17 MED ORDER — HYDROCODONE-ACETAMINOPHEN 5-325 MG PO TABS
1.0000 | ORAL_TABLET | Freq: Once | ORAL | Status: AC
  Administered 2012-06-17: 2 via ORAL
  Filled 2012-06-17: qty 2

## 2012-06-17 MED ORDER — HYDROCODONE-ACETAMINOPHEN 5-325 MG PO TABS: 1.0000 | ORAL_TABLET | Freq: Four times a day (QID) | ORAL | Status: DC | PRN

## 2012-06-17 NOTE — Progress Notes (Signed)
*  Preliminary Results* Left upper extremity venous duplex completed. Left upper extremity is negative for deep and superficial vein thrombosis.  06/17/2012 4:37 PM Gertie Fey, RDMS, RDCS

## 2012-06-17 NOTE — ED Notes (Signed)
Per patient, woke up yesterday morning with left arm pain, no injury-history of "joint pain"

## 2012-06-17 NOTE — ED Provider Notes (Addendum)
History     CSN: [id number]  Arrival date & time 06/17/12  1339   First MD Initiated Contact with Patient 06/17/12 [phone]      Chief Complaint  Patient presents with  . left arm pain     (Consider location/radiation/quality/duration/timing/severity/associated sxs/prior treatment) HPI Comments: Pt comes in with cc of left arm pain. She has hx of breast CA, treated in September. No port placed. States that she started having sudden onset left upper extremity pain y'day - associated with some swelling.  No hx of DVT, PE. No hx of trauma. No numbness, tingling.  The history is provided by the patient.    Past Medical History  Diagnosis Date  . Abdominal cramps   . Muscle spasms of head and/or neck     following to the breast  . Fatigue   . Passed out     4th of july  . Migraines   . Chronic headaches   . Arthritis   . Cancer     s/p radiation- last dose 6/12//has been off Tamoxifen 8/12  . Sleep apnea     STOP BANG SCORE 5  . Lymphedema     RIGHT ARM-////  STATES USE LEFT ARM FOR BP'S  . S/P radiation therapy 08/19/07 - 10/10/07    Left Breast/5040 cGy/28 fractions with Boost for a toatl dose of 6300 dGy  . S/P radiation therapy 08/14/10 -10/03/10    right breast  . Costochondritis     Past Surgical History  Procedure Laterality Date  . Breast lumpectomy  2009/2012    LEFT/RIGHT with lymph node dissection  . Abdominal hysterectomy      with left salpingooophorectomy  . Knee arthroscopy      right  . Oophorectomy  2013    Family History  Problem Relation Age of Onset  . Breast cancer Mother   . Heart disease Father   . Cervical cancer Paternal Aunt   . Prostate cancer Maternal Uncle     History  Substance Use Topics  . Smoking status: Current Every Day Smoker -- 0.25 packs/day for 1 years    Types: Cigarettes  . Smokeless tobacco: Not on file  . Alcohol Use: Yes     Comment: 3 x a week    OB History   Grav Para Term Preterm Abortions TAB SAB Ect Mult  Living                  Review of Systems  Constitutional: Negative for activity change.  HENT: Negative for facial swelling and neck pain.   Respiratory: Negative for cough, shortness of breath and wheezing.   Cardiovascular: Negative for chest pain.  Gastrointestinal: Negative for nausea, vomiting, abdominal pain, diarrhea, constipation, blood in stool and abdominal distention.  Genitourinary: Negative for hematuria and difficulty urinating.  Musculoskeletal: Positive for myalgias and arthralgias.  Skin: Negative for color change.  Neurological: Negative for speech difficulty.  Hematological: Does not bruise/bleed easily.  Psychiatric/Behavioral: Negative for confusion.    Allergies  Penicillins  Home Medications   Current Outpatient Rx  Name  Route  Sig  Dispense  Refill  . ALPRAZolam (XANAX) 1 MG tablet   Oral   Take 1 mg by mouth 3 (three) times daily as needed. anxiety         . butalbital-aspirin-caffeine (FIORINAL) 50-325-40 MG per capsule   Oral   Take 1 capsule by mouth 3 (three) times daily as needed. For headache         .  cyclobenzaprine (FLEXERIL) 10 MG tablet   Oral   Take 10 mg by mouth 3 (three) times daily as needed for muscle spasms.          . diazepam (VALIUM) 5 MG tablet   Oral   Take 1 tablet (5 mg total) by mouth every 8 (eight) hours as needed (muscle spasm).   12 tablet   0   . DULoxetine (CYMBALTA) 20 MG capsule   Oral   Take 1 capsule (20 mg total) by mouth daily.   30 capsule   3   . esomeprazole (NEXIUM) 40 MG capsule   Oral   Take 40 mg by mouth daily before breakfast.         . fish oil-omega-3 fatty acids 1000 MG capsule   Oral   Take 1 capsule by mouth daily with breakfast.          . hydrochlorothiazide (HYDRODIURIL) 25 MG tablet   Oral   Take 25 mg by mouth at bedtime.          Marland Kitchen ibuprofen (ADVIL,MOTRIN) 800 MG tablet   Oral   Take 800 mg by mouth every 8 (eight) hours as needed. For pain         .  lisinopril (PRINIVIL,ZESTRIL) 10 MG tablet   Oral   Take 10 mg by mouth daily.         . magnesium oxide (MAG-OX) 400 MG tablet   Oral   Take 400 mg by mouth daily with breakfast.          . Multiple Vitamin (MULTIVITAMIN) tablet   Oral   Take 1 tablet by mouth daily with breakfast.          . naproxen sodium (ALEVE) 220 MG tablet   Oral   Take 220 mg by mouth 2 (two) times daily as needed (moderate pain.).          Marland Kitchen OVER THE COUNTER MEDICATION   Topical   Apply 1 application topically 2 (two) times daily. TYLENOL CREAM TOPICALLY         . potassium chloride (K-DUR) 10 MEQ tablet   Oral   Take 10 mEq by mouth daily with breakfast.          . pregabalin (LYRICA) 75 MG capsule   Oral   Take 1 capsule (75 mg total) by mouth 2 (two) times daily.   60 capsule   1   . Probiotic Product (ALIGN PO)   Oral   Take 1 capsule by mouth at bedtime.          . senna (SENOKOT) 8.6 MG tablet   Oral   Take 1 tablet by mouth every other day.            BP 125/84  Pulse 95  Temp(Src) 98.3 F (36.8 C) (Oral)  Resp 20  SpO2 100%  Physical Exam  Nursing note and vitals reviewed. Constitutional: She is oriented to person, place, and time. She appears well-developed and well-nourished.  HENT:  Head: Normocephalic and atraumatic.  Eyes: EOM are normal. Pupils are equal, round, and reactive to light.  Neck: Neck supple.  Cardiovascular: Normal rate, regular rhythm and normal heart sounds.   No murmur heard. Pulmonary/Chest: Effort normal. No respiratory distress.  Abdominal: Soft. She exhibits no distension. There is no tenderness. There is no rebound and no guarding.  Musculoskeletal:  LUE swelling - no deformity, able to discriminate between sharp and dull, 2+ radial pulse.  Neurological:  She is alert and oriented to person, place, and time.  Skin: Skin is warm and dry.    ED Course  Procedures (including critical care time)  Labs Reviewed  BASIC METABOLIC  PANEL  TROPONIN I   No results found.   No diagnosis found.    MDM  Pt comes in with cc of LUE swelling and pain. Reproducible pain. Has hx of cancer. Will get Duplex. No signs of infection, and neurovascularly intact     Date: 06/17/2012  Rate: 84  Rhythm: normal sinus rhythm  QRS Axis: normal  Intervals: normal  ST/T Wave abnormalities: normal  Conduction Disutrbances: none  Narrative Interpretation: unremarkable   Derwood Kaplan, MD 06/17/12 1611    Derwood Kaplan, MD 06/17/12 1704

## 2012-07-08 ENCOUNTER — Telehealth: Payer: Self-pay | Admitting: Oncology

## 2012-07-08 NOTE — Telephone Encounter (Signed)
FAXED PT MEDICAL RECORDS TO [facility].

## 2012-07-09 ENCOUNTER — Telehealth: Payer: Self-pay | Admitting: *Deleted

## 2012-07-09 ENCOUNTER — Other Ambulatory Visit: Payer: Self-pay | Admitting: Physician Assistant

## 2012-07-09 ENCOUNTER — Other Ambulatory Visit: Payer: Self-pay | Admitting: *Deleted

## 2012-07-09 DIAGNOSIS — C50912 Malignant neoplasm of unspecified site of left female breast: Secondary | ICD-10-CM

## 2012-07-09 MED ORDER — DOXYCYCLINE HYCLATE 100 MG PO TABS: 100.0000 mg | ORAL_TABLET | Freq: Every day | ORAL | Status: DC

## 2012-07-09 NOTE — Telephone Encounter (Signed)
Called and spoke with patient about her left arm swelling.  Explained to her that since she is stating that her arm feels feverish and hot this could be cellulitis and per Vikki Ports would like to start her on some antibiotics.  I will call this in for her and she will see Dr. Darnelle Catalan on 07/15/12.  Informed her that if she develops a fever or if her arm gets worse to call us back.. She verbalized understanding.

## 2012-07-09 NOTE — Telephone Encounter (Signed)
Patient called requesting help for left arm swelling.  "Left arm aches like a toothache, it's swollen and it feels feverish or hot."  Takes ibuprofen and flexeril.  Reports having gone to ED about this before as instructed by collaborative nurse and only being given vicodin which made her nauseated.  Has placed an ace wrap on her arm which does help pain subside.  [facility]alls [facility] the past and being told she needs a sleeve but then was told she didn't so does not have a sleeve.  Expressed concern of Medicaid coverage for clinic visits.  Will notify providers.  This nurse instructed her to use ace wrapfor compression, drink fluids, avoid salt, keep arm elevated especially at night and to do the exercises the therapist taught her.  Says she can't do the exercises due to the pain and swelling.

## 2012-07-15 ENCOUNTER — Ambulatory Visit (HOSPITAL_BASED_OUTPATIENT_CLINIC_OR_DEPARTMENT_OTHER): Payer: Medicaid Other | Admitting: Oncology

## 2012-07-15 VITALS — BP 127/87 | HR 85 | Temp 98.5°F | Resp 20 | Ht 62.0 in | Wt 247.5 lb

## 2012-07-15 DIAGNOSIS — Z853 Personal history of malignant neoplasm of breast: Secondary | ICD-10-CM

## 2012-07-15 DIAGNOSIS — Z170 Estrogen receptor positive status [ER+]: Secondary | ICD-10-CM

## 2012-07-15 DIAGNOSIS — I890 Lymphedema, not elsewhere classified: Secondary | ICD-10-CM

## 2012-07-15 DIAGNOSIS — IMO0001 Reserved for inherently not codable concepts without codable children: Secondary | ICD-10-CM

## 2012-07-18 ENCOUNTER — Telehealth: Payer: Self-pay | Admitting: Oncology

## 2012-07-18 ENCOUNTER — Telehealth: Payer: Self-pay | Admitting: *Deleted

## 2012-07-18 NOTE — Telephone Encounter (Signed)
Mailed the pt her 2015 appt calendar.

## 2012-07-18 NOTE — Telephone Encounter (Signed)
Left message for pt to return my call so I can schedule a genetic appt.  

## 2012-07-19 NOTE — Progress Notes (Signed)
ID: Ashley Lindsey   DOB: 09-Aug-1960  MR#: [id number]  WUJ#:[id number]  PCP: Alva Garnet., MD GYN: Antionette Char SU: Claud Kelp OTHER MD: Antony Blackbird   HISTORY OF PRESENT ILLNESS: From Dr. Doreatha Massed 07/09/2007 intake total  "This woman has been in good health.  She undergoes annual screening mammography.  She had a screening mammogram in November, 2008 that showed calcifications in the left breast.  Additional views were recommended, which took place on 05/02/2007.  This showed a suspicious cluster of microcalcifications upper outer quadrant of left breast.  Stereotactic biopsy was recommended.  Biopsy on 05/27/2007 showed DCIS with calcifications, ER/PR positive at 100% respectively.  A preoperative MRI scan performed on 06/04/2007 showed a 1.7 x 1.4 x 0.9 cm DCIS at 12 o'clock position left breast.  No other abnormalities were seen.  Ms. Jedlicka underwent a lumpectomy on 06/20/2007.  Final pathology case number [id number] showed a 0.4-0.5 cm focus of invasive ductal cancer grade 1 of 3 with tubular features, clear surgical margins.  Lymphovascular invasion was not seen.  Associated DCIS measuring 0.4 cm was seen.  In addition, the tumor was ER/PR positive at 77% and 96% respectively, proliferative index less than 5%, HER-2 was 0.  She has had an unremarkable postoperative course"  Her subsequent history is as detailed below.  INTERVAL HISTORY: Ashley Lindsey returns today for followup of her breast cancer. She is establishing herself on my service today.  REVIEW OF SYSTEMS: She had an episode of left upper extremity cellulitis, which is being treated with doxycycline. She still has some swelling and itching on that side. She complains of night sweats, fatigue, blurred vision, ringing in her ears, runny nose, sinus problems, ankle swelling, chest wall pain, shortness of breath when walking up stairs, needing to sleep on 10-12 pillows, poor appetite, heartburn, and just "not feeling well". A  detailed review of systems was otherwise noncontributory  PAST MEDICAL HISTORY: Past Medical History  Diagnosis Date  . Abdominal cramps   . Muscle spasms of head and/or neck     following to the breast  . Fatigue   . Passed out     4th of july  . Migraines   . Chronic headaches   . Arthritis   . Cancer     s/p radiation- last dose 6/12//has been off Tamoxifen 8/12  . Sleep apnea     STOP BANG SCORE 5  . Lymphedema     RIGHT ARM-////  STATES USE LEFT ARM FOR BP'S  . S/P radiation therapy 08/19/07 - 10/10/07    Left Breast/5040 cGy/28 fractions with Boost for a toatl dose of 6300 dGy  . S/P radiation therapy 08/14/10 -10/03/10    right breast  . Costochondritis     PAST SURGICAL HISTORY: Past Surgical History  Procedure Laterality Date  . Breast lumpectomy  2009/2012    LEFT/RIGHT with lymph node dissection  . Abdominal hysterectomy      with left salpingooophorectomy  . Knee arthroscopy      right  . Oophorectomy  2013    FAMILY HISTORY Family History  Problem Relation Age of Onset  . Breast cancer Mother   . Heart disease Father   . Cervical cancer Paternal Aunt   . Prostate cancer Maternal Uncle    the patient's father died from a heart attack at age 72. The patient's mother is currently 11. She was diagnosed with breast cancer at age 29. The patient's mother had one sister, diagnosed with cervical cancer  at age 91. The patient's mother had two out of three brothers died from cancer, the patient believes prostate. Ashley Lindsey herself had 2 brothers and one sister. Non-of them have had cancer. There is no other history of breast cancer and no history of ovarian cancer in the family.  GYNECOLOGIC HISTORY: Menarche age 18, first live birth age 76, she is GX P1. 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 The patient shares a home  with her got sister, Levin Erp. The patient's daughter, Lester Weldon. Earlene Plater, is a Psychologist, occupational. The patient has no grandchildren. She attends a [employer]   ADVANCED DIRECTIVES: Not in place  HEALTH MAINTENANCE: History  Substance Use Topics  . Smoking status: Current Every Day Smoker -- 0.25 packs/day for 1 years    Types: Cigarettes  . Smokeless tobacco: Not on file  . Alcohol Use: Yes     Comment: 3 x a week     Colonoscopy:  PAP:  Bone density:  Lipid panel:  Allergies  Allergen Reactions  . Penicillins Hives and Swelling    Current Outpatient Prescriptions  Medication Sig Dispense Refill  . ALPRAZolam (XANAX) 1 MG tablet Take 1 mg by mouth 3 (three) times daily as needed. anxiety      . butalbital-aspirin-caffeine (FIORINAL) 50-325-40 MG per capsule Take 1 capsule by mouth 3 (three) times daily as needed. For headache      . cyclobenzaprine (FLEXERIL) 10 MG tablet Take 10 mg by mouth 3 (three) times daily as needed for muscle spasms.       . diazepam (VALIUM) 5 MG tablet Take 1 tablet (5 mg total) by mouth every 8 (eight) hours as needed (muscle spasm).  12 tablet  0  . doxycycline (VIBRA-TABS) 100 MG tablet Take 1 tablet (100 mg total) by mouth daily.  10 tablet  0  . DULoxetine (CYMBALTA) 20 MG capsule Take 1 capsule (20 mg total) by mouth daily.  30 capsule  3  . esomeprazole (NEXIUM) 40 MG capsule Take 40 mg by mouth daily before breakfast.      . fish oil-omega-3 fatty acids 1000 MG capsule Take 1 capsule by mouth daily with breakfast.       . hydrochlorothiazide (HYDRODIURIL) 25 MG tablet Take 25 mg by mouth at bedtime.       Marland Kitchen HYDROcodone-acetaminophen (NORCO/VICODIN) 5-325 MG per tablet Take 1 tablet by mouth every 6 (six) hours as needed for pain.  15 tablet  0  . ibuprofen (ADVIL,MOTRIN) 600 MG tablet Take 1 tablet (600 mg total) by mouth every 8 (eight) hours as needed for pain.  12 tablet  0  . ibuprofen (ADVIL,MOTRIN) 800 MG tablet Take 800 mg by  mouth every 8 (eight) hours as needed. For pain      . lisinopril (PRINIVIL,ZESTRIL) 10 MG tablet Take 10 mg by mouth daily.      . magnesium oxide (MAG-OX) 400 MG tablet Take 400 mg by mouth daily with breakfast.       . Multiple Vitamin (MULTIVITAMIN) tablet Take 1 tablet by mouth daily with breakfast.       . naproxen sodium (ALEVE) 220 MG tablet Take 220 mg by mouth 2 (two) times daily as needed (moderate pain.).       Marland Kitchen OVER THE COUNTER MEDICATION Apply 1 application topically 2 (two) times daily. TYLENOL CREAM TOPICALLY      .  potassium chloride (K-DUR) 10 MEQ tablet Take 10 mEq by mouth daily with breakfast.       . pregabalin (LYRICA) 75 MG capsule Take 1 capsule (75 mg total) by mouth 2 (two) times daily.  60 capsule  1  . Probiotic Product (ALIGN PO) Take 1 capsule by mouth at bedtime.       . senna (SENOKOT) 8.6 MG tablet Take 1 tablet by mouth every other day.        No current facility-administered medications for this visit.    OBJECTIVE: Middle-aged [place] American woman in no acute distress Filed Vitals:   07/15/12 1356  BP: 127/87  Pulse: 85  Temp: 98.5 F (36.9 Lindsey)  Resp: 20     Body mass index is 45.26 kg/(m^2).    ECOG FS: 2  Sclerae unicteric Oropharynx clear No cervical or supraclavicular adenopathy Lungs no rales or rhonchi Heart regular rate and rhythm Abd soft, obese, nontender, positive bowel sounds MSK no focal spinal tenderness, 1+ left upper extremity lymphedema Neuro: nonfocal, well oriented, pleasant affect Breasts: Both breasts are status post lumpectomy and radiation. There is no evidence of local recurrence. Both axillae are benign   LAB RESULTS: Lab Results  Component Value Date   WBC 7.2 03/01/2012   NEUTROABS 6.5 12/26/2011   HGB 12.1 03/01/2012   HCT 37.6 03/01/2012   MCV 85.6 03/01/2012   PLT 311 03/01/2012      Chemistry      Component Value Date/Time   NA 139 06/17/2012 1610   NA 139 12/26/2011 1538   K 3.7 06/17/2012 1610   K 4.4  12/26/2011 1538   CL 104 06/17/2012 1610   CL 105 12/26/2011 1538   CO2 27 06/17/2012 1610   CO2 25 12/26/2011 1538   BUN 14 06/17/2012 1610   BUN 12.0 12/26/2011 1538   CREATININE 0.62 06/17/2012 1610   CREATININE 0.7 12/26/2011 1538      Component Value Date/Time   CALCIUM 8.9 06/17/2012 1610   CALCIUM 9.3 12/26/2011 1538   ALKPHOS 86 03/01/2012 0800   ALKPHOS 106 12/26/2011 1538   AST 19 03/01/2012 0800   AST 16 12/26/2011 1538   ALT 16 03/01/2012 0800   ALT 22 12/26/2011 1538   BILITOT 0.1* 03/01/2012 0800   BILITOT <0.20 Repeated and Verified 12/26/2011 1538       Lab Results  Component Value Date   LABCA2 26 12/26/2011    No components found with this basename: [id number]    No results found for this basename: INR,  in the last 168 hours  Urinalysis    Component Value Date/Time   COLORURINE YELLOW 05/23/2010 1604   APPEARANCEUR CLEAR 05/23/2010 1604   LABSPEC 1.021 05/23/2010 1604   PHURINE 6.0 05/23/2010 1604   HGBUR NEGATIVE 05/23/2010 1604   BILIRUBINUR NEGATIVE 05/23/2010 1604   KETONESUR NEGATIVE 05/23/2010 1604   PROTEINUR NEGATIVE 05/23/2010 1604   UROBILINOGEN 1.0 05/23/2010 1604   NITRITE NEGATIVE 05/23/2010 1604   LEUKOCYTESUR NEGATIVE MICROSCOPIC NOT DONE ON URINES WITH NEGATIVE PROTEIN, BLOOD, LEUKOCYTES, NITRITE, OR GLUCOSE <1000 mg/dL. 05/23/2010 1604    STUDIES: Bone density 03/23/2011 was normal. Mammogram was due November of 2013.  ASSESSMENT: 52 y.o. [place] woman  (1) status post left lumpectomy 06/20/2007 for a pT1a invasive ductal carcinoma, grade 1, estrogen receptor 77%, progesterone receptor and 96%, with an MIB-1 of less than 5%, and no HER-2 amplification  (2) left sentinel lymph node biopsy (report erroneously states "right") 07/21/2007, was pN0(i+)  (  3) left breast adjuvant radiation completed 06/19/200  (4) tamoxifen started September 2009, discontinued March 2012  (5) status post right lumpectomy 05/26/2010 for ductal carcinoma in situ, with margins cleared  third 20 08/11/2010  (6) status post right breast adjuvant radiation completed 10/03/2010  PLAN: Ashley Lindsey is doing well from a breast cancer point of view, now 5 years out from her original surgery. She understands that breast cancers smaller than 5 mm have a less than 5% risk of recurrence with local treatment only. Her right-sided cancer was noninvasive, and therefore not life threatening.  She would be a good candidate for aromatase inhibitor except for her fibromyalgia, which I think will make these drugs impossible. Instead we will follow with observation alone, especially since her prognosis overall is so good.  I have written her for a left upper extremity compression sleeve and she will go to the lymphedema clinic to get that measured. She has missed repeated appointments with genetics, and I have urged her to contact our genetics counselor for followup of that issue. She is currently behind on her mammography, and she will call to get a mammogram scheduled within the next month.  Otherwise she will see [place] again in one year. She knows to call for any problems that may develop before that visit.   Ashley Lindsey    07/19/2012

## 2012-07-24 ENCOUNTER — Telehealth: Payer: Self-pay | Admitting: *Deleted

## 2012-07-24 NOTE — Telephone Encounter (Signed)
Left message for pt to return my call so I can schedule a genetic appt.  

## 2012-07-27 ENCOUNTER — Other Ambulatory Visit: Payer: Self-pay | Admitting: Oncology

## 2012-07-27 NOTE — Progress Notes (Signed)
ID: Ashley Lindsey   DOB: [birth year]  MR#: [id number]  HYQ#:[id number]  PCP: Alva Garnet., MD GYN: Antionette Char SU: Claud Kelp OTHER MD: Antony Blackbird   HISTORY OF PRESENT ILLNESS: From Dr. Doreatha Massed 07/09/2007 intake total  "This woman has been in good health.  She undergoes annual screening mammography.  She had a screening mammogram in November, 2008 that showed calcifications in the left breast.  Additional views were recommended, which took place on 05/02/2007.  This showed a suspicious cluster of microcalcifications upper outer quadrant of left breast.  Stereotactic biopsy was recommended.  Biopsy on 05/27/2007 showed DCIS with calcifications, ER/PR positive at 100% respectively.  A preoperative MRI scan performed on 06/04/2007 showed a 1.7 x 1.4 x 0.9 cm DCIS at 12 o'clock position left breast.  No other abnormalities were seen.  [name] underwent a lumpectomy on 06/20/2007.  Final pathology case number [id number] showed a 0.4-0.5 cm focus of invasive ductal cancer grade 1 of 3 with tubular features, clear surgical margins.  Lymphovascular invasion was not seen.  Associated DCIS measuring 0.4 cm was seen.  In addition, the tumor was ER/PR positive at 77% and 96% respectively, proliferative index less than 5%, HER-2 was 0.  She has had an unremarkable postoperative course"  Her subsequent history is as detailed below.  INTERVAL HISTORY: Ashley Lindsey returns today for followup of her breast cancer. She is establishing herself on my service today.  REVIEW OF SYSTEMS: She had an episode of left upper extremity cellulitis, which is being treated with doxycycline. She still has some swelling and itching on that side. She complains of night sweats, fatigue, blurred vision, ringing in her ears, runny nose, sinus problems, ankle swelling, chest wall pain, shortness of breath when walking up stairs, needing to sleep on 10-12 pillows, poor appetite, heartburn, and just "not feeling well". A  detailed review of systems was otherwise noncontributory  PAST MEDICAL HISTORY: Past Medical History  Diagnosis Date  . Abdominal cramps   . Muscle spasms of head and/or neck     following to the breast  . Fatigue   . Passed out     4th of july  . Migraines   . Chronic headaches   . Arthritis   . Cancer     s/p radiation- last dose 6/12//has been off Tamoxifen 8/12  . Sleep apnea     STOP BANG SCORE 5  . Lymphedema     RIGHT ARM-////  STATES USE LEFT ARM FOR BP'S  . S/P radiation therapy 08/19/07 - 10/10/07    Left Breast/5040 cGy/28 fractions with Boost for a toatl dose of 6300 dGy  . S/P radiation therapy 08/14/10 -10/03/10    right breast  . Costochondritis     PAST SURGICAL HISTORY: Past Surgical History  Procedure Laterality Date  . Breast lumpectomy  2009/2012    LEFT/RIGHT with lymph node dissection  . Abdominal hysterectomy      with left salpingooophorectomy  . Knee arthroscopy      right  . Oophorectomy  2013    FAMILY HISTORY Family History  Problem Relation Age of Onset  . Breast cancer Mother   . Heart disease Father   . Cervical cancer Paternal Aunt   . Prostate cancer Maternal Uncle    the patient's father died from a heart attack at age 54. The patient's mother is currently 21. She was diagnosed with breast cancer at age 54. The patient's mother had one sister, diagnosed with cervical cancer  at age 54. The patient's mother had two out of three brothers died from cancer, the patient believes prostate. Alinda Money herself had 2 brothers and one sister. Non-of them have had cancer. There is no other history of breast cancer and no history of ovarian cancer in the family.  GYNECOLOGIC HISTORY: Menarche age 82, first live birth age 24, she is GX P1. 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 The patient shares a home  with her got sister, Ashley Lindsey. The patient's daughter, Ashley Lindsey. Ashley Lindsey, is a Psychologist, occupational. The patient has no grandchildren. She attends a [employer]   ADVANCED DIRECTIVES: Not in place  HEALTH MAINTENANCE: History  Substance Use Topics  . Smoking status: Current Every Day Smoker -- 0.25 packs/day for 1 years    Types: Cigarettes  . Smokeless tobacco: Not on file  . Alcohol Use: Yes     Comment: 3 x a week     Colonoscopy:  PAP:  Bone density:  Lipid panel:  Allergies  Allergen Reactions  . Penicillins Hives and Swelling    Current Outpatient Prescriptions  Medication Sig Dispense Refill  . ALPRAZolam (XANAX) 1 MG tablet Take 1 mg by mouth 3 (three) times daily as needed. anxiety      . butalbital-aspirin-caffeine (FIORINAL) 50-325-40 MG per capsule Take 1 capsule by mouth 3 (three) times daily as needed. For headache      . cyclobenzaprine (FLEXERIL) 10 MG tablet Take 10 mg by mouth 3 (three) times daily as needed for muscle spasms.       . diazepam (VALIUM) 5 MG tablet Take 1 tablet (5 mg total) by mouth every 8 (eight) hours as needed (muscle spasm).  12 tablet  0  . doxycycline (VIBRA-TABS) 100 MG tablet Take 1 tablet (100 mg total) by mouth daily.  10 tablet  0  . DULoxetine (CYMBALTA) 20 MG capsule Take 1 capsule (20 mg total) by mouth daily.  30 capsule  3  . esomeprazole (NEXIUM) 40 MG capsule Take 40 mg by mouth daily before breakfast.      . fish oil-omega-3 fatty acids 1000 MG capsule Take 1 capsule by mouth daily with breakfast.       . hydrochlorothiazide (HYDRODIURIL) 25 MG tablet Take 25 mg by mouth at bedtime.       Marland Kitchen HYDROcodone-acetaminophen (NORCO/VICODIN) 5-325 MG per tablet Take 1 tablet by mouth every 6 (six) hours as needed for pain.  15 tablet  0  . ibuprofen (ADVIL,MOTRIN) 600 MG tablet Take 1 tablet (600 mg total) by mouth every 8 (eight) hours as needed for pain.  12 tablet  0  . ibuprofen (ADVIL,MOTRIN) 800 MG tablet Take 800 mg by  mouth every 8 (eight) hours as needed. For pain      . lisinopril (PRINIVIL,ZESTRIL) 10 MG tablet Take 10 mg by mouth daily.      . magnesium oxide (MAG-OX) 400 MG tablet Take 400 mg by mouth daily with breakfast.       . Multiple Vitamin (MULTIVITAMIN) tablet Take 1 tablet by mouth daily with breakfast.       . naproxen sodium (ALEVE) 220 MG tablet Take 220 mg by mouth 2 (two) times daily as needed (moderate pain.).       Marland Kitchen OVER THE COUNTER MEDICATION Apply 1 application topically 2 (two) times daily. TYLENOL CREAM TOPICALLY      .  potassium chloride (K-DUR) 10 MEQ tablet Take 10 mEq by mouth daily with breakfast.       . pregabalin (LYRICA) 75 MG capsule Take 1 capsule (75 mg total) by mouth 2 (two) times daily.  60 capsule  1  . Probiotic Product (ALIGN PO) Take 1 capsule by mouth at bedtime.       . senna (SENOKOT) 8.6 MG tablet Take 1 tablet by mouth every other day.        No current facility-administered medications for this visit.    OBJECTIVE: Middle-aged [place] American woman in no acute distress There were no vitals filed for this visit.   There is no weight on file to calculate BMI.    ECOG FS: 2  Sclerae unicteric Oropharynx clear No cervical or supraclavicular adenopathy Lungs no rales or rhonchi Heart regular rate and rhythm Abd soft, obese, nontender, positive bowel sounds MSK no focal spinal tenderness, 1+ left upper extremity lymphedema Neuro: nonfocal, well oriented, pleasant affect Breasts: Both breasts are status post lumpectomy and radiation. There is no evidence of local recurrence. Both axillae are benign   LAB RESULTS: Lab Results  Component Value Date   WBC 7.2 03/01/2012   NEUTROABS 6.5 12/26/2011   HGB 12.1 03/01/2012   HCT 37.6 03/01/2012   MCV 85.6 03/01/2012   PLT 311 03/01/2012      Chemistry      Component Value Date/Time   NA 139 06/17/2012 1610   NA 139 12/26/2011 1538   K 3.7 06/17/2012 1610   K 4.4 12/26/2011 1538   CL 104 06/17/2012 1610   CL  105 12/26/2011 1538   CO2 27 06/17/2012 1610   CO2 25 12/26/2011 1538   BUN 14 06/17/2012 1610   BUN 12.0 12/26/2011 1538   CREATININE 0.62 06/17/2012 1610   CREATININE 0.7 12/26/2011 1538      Component Value Date/Time   CALCIUM 8.9 06/17/2012 1610   CALCIUM 9.3 12/26/2011 1538   ALKPHOS 86 03/01/2012 0800   ALKPHOS 106 12/26/2011 1538   AST 19 03/01/2012 0800   AST 16 12/26/2011 1538   ALT 16 03/01/2012 0800   ALT 22 12/26/2011 1538   BILITOT 0.1* 03/01/2012 0800   BILITOT <0.20 Repeated and Verified 12/26/2011 1538       Lab Results  Component Value Date   LABCA2 26 12/26/2011    No components found with this basename: [id number]    No results found for this basename: INR,  in the last 168 hours  Urinalysis    Component Value Date/Time   COLORURINE YELLOW 05/23/2010 1604   APPEARANCEUR CLEAR 05/23/2010 1604   LABSPEC 1.021 05/23/2010 1604   PHURINE 6.0 05/23/2010 1604   HGBUR NEGATIVE 05/23/2010 1604   BILIRUBINUR NEGATIVE 05/23/2010 1604   KETONESUR NEGATIVE 05/23/2010 1604   PROTEINUR NEGATIVE 05/23/2010 1604   UROBILINOGEN 1.0 05/23/2010 1604   NITRITE NEGATIVE 05/23/2010 1604   LEUKOCYTESUR NEGATIVE MICROSCOPIC NOT DONE ON URINES WITH NEGATIVE PROTEIN, BLOOD, LEUKOCYTES, NITRITE, OR GLUCOSE <1000 mg/dL. 05/23/2010 1604    STUDIES: Bone density 03/23/2011 was normal. Mammogram was due November of 2013.  ASSESSMENT: 52 y.o. [place] woman  (1) status post left lumpectomy 06/20/2007 for a pT1a invasive ductal carcinoma, grade 1, estrogen receptor 77%, progesterone receptor and 96%, with an MIB-1 of less than 5%, and no HER-2 amplification  (2) left sentinel lymph node biopsy (report erroneously states "right") 07/21/2007, was pN0(i+)  (3) left breast adjuvant radiation completed 06/19/200  (4) tamoxifen started  September 2009, discontinued March 2012  (5) status post right lumpectomy 05/26/2010 for ductal carcinoma in situ, with margins cleared third 20 08/11/2010  (6) status post right  breast adjuvant radiation completed 10/03/2010  PLAN: Alinda Money is doing well from a breast cancer point of view, now 5 years out from her original surgery. She understands that breast cancers smaller than 5 mm have a less than 5% risk of recurrence with local treatment only. Her right-sided cancer was noninvasive, and therefore not life threatening.  She would be a good candidate for aromatase inhibitor except for her fibromyalgia, which I think will make these drugs impossible. Instead we will follow with observation alone, especially since her prognosis overall is so good.  I have written her for a left upper extremity compression sleeve and she will go to the lymphedema clinic to get that measured. She has missed repeated appointments with genetics, and I have urged her to contact our genetics counselor for followup of that issue. She is currently behind on her mammography, and she will call to get a mammogram scheduled within the next month.  Otherwise she will see [place] again in one year. She knows to call for any problems that may develop before that visit.  ADDENDUM: Dr Colonel Bald reviewed path and tells me the closest margin (deep) was 1.8 cm. The HER-2 repeat was performed on the smaller nodule on the skin.   Chesney Suares C    07/27/2012

## 2012-07-30 ENCOUNTER — Telehealth: Payer: Self-pay | Admitting: *Deleted

## 2012-07-30 NOTE — Telephone Encounter (Signed)
Confirmed 09/08/12 genetic appt w/ pt.  Mailed pt calendar.

## 2012-08-07 ENCOUNTER — Ambulatory Visit: Payer: Medicaid Other | Admitting: Radiation Oncology

## 2012-08-14 ENCOUNTER — Ambulatory Visit: Payer: Medicaid Other | Attending: Oncology | Admitting: Physical Therapy

## 2012-09-08 ENCOUNTER — Encounter: Payer: Self-pay | Admitting: Radiation Oncology

## 2012-09-08 ENCOUNTER — Other Ambulatory Visit: Payer: Medicaid Other | Admitting: Lab

## 2012-09-08 ENCOUNTER — Ambulatory Visit (HOSPITAL_BASED_OUTPATIENT_CLINIC_OR_DEPARTMENT_OTHER): Payer: Medicaid Other | Admitting: Genetic Counselor

## 2012-09-08 ENCOUNTER — Ambulatory Visit
Admission: RE | Admit: 2012-09-08 | Discharge: 2012-09-08 | Disposition: A | Discharge status: Home / Self Care | Payer: Medicaid Other | Source: Ambulatory Visit | Attending: Radiation Oncology | Admitting: Radiation Oncology

## 2012-09-08 VITALS — BP 118/78 | HR 81 | Temp 97.6°F | Resp 20 | Wt 246.5 lb

## 2012-09-08 DIAGNOSIS — Z803 Family history of malignant neoplasm of breast: Secondary | ICD-10-CM | POA: Insufficient documentation

## 2012-09-08 DIAGNOSIS — C50919 Malignant neoplasm of unspecified site of unspecified female breast: Secondary | ICD-10-CM

## 2012-09-08 DIAGNOSIS — C50212 Malignant neoplasm of upper-inner quadrant of left female breast: Secondary | ICD-10-CM | POA: Insufficient documentation

## 2012-09-08 DIAGNOSIS — Z853 Personal history of malignant neoplasm of breast: Secondary | ICD-10-CM

## 2012-09-08 NOTE — Progress Notes (Signed)
Dr. Renae Gloss requested a cancer genetics consultation for Ashley Lindsey, a 52 y.o. female, due to a personal and family history of cancer.  Ashley Lindsey presents to clinic today to discuss the possibility of a hereditary predisposition to cancer, genetic testing, and to further clarify her future cancer risks, as well as potential cancer risk for family members.   HISTORY OF PRESENT ILLNESS: Ashley Lindsey reports being diagnosed with bilateral breast cancer. Her first diagnosis, IDC, was at age 79 in the left breast. Her second diagnosis was IDC in the right breast at age 79. She had a TAH-LSO in 2004 for benign reasons and underwent a right salpingo-oophorectomy in 2013. She has no history of other cancer.   Past Medical History  Diagnosis Date   Abdominal cramps    Muscle spasms of head and/or neck     following to the breast   Fatigue    Passed out     4th of july   Migraines    Chronic headaches    Arthritis    Cancer     s/p radiation- last dose 6/12//has been off Tamoxifen 8/12   Sleep apnea     STOP BANG SCORE 5   Lymphedema     RIGHT ARM-////  STATES USE LEFT ARM FOR BP'S   S/P radiation therapy 08/19/07 - 10/10/07    Left Breast/5040 cGy/28 fractions with Boost for a toatl dose of 6300 dGy   S/P radiation therapy 08/14/10 -10/03/10    right breast   Costochondritis     Past Surgical History  Procedure Laterality Date   Breast lumpectomy  2009/2012    LEFT/RIGHT with lymph node dissection   Abdominal hysterectomy      with left salpingooophorectomy   Knee arthroscopy      right   Oophorectomy  2013     History   Social History   Marital Status: Divorced    Spouse Name: N/A    Number of Children: N/A   Years of Education: N/A   Social History Main Topics   Smoking status: Current Every Day Smoker -- 0.25 packs/day for 1 years    Types: Cigarettes   Smokeless tobacco: Not on file   Alcohol Use: Yes     Comment: 3 x a week   Drug Use: No    Sexually Active: Not on file   Other Topics Concern   Not on file   Social History Narrative   No narrative on file     FAMILY HISTORY:  During the visit, a 4-generation pedigree was obtained. Significant diagnoses include the following:  Family History  Problem Relation Age of Onset   Breast cancer Mother 15   Heart disease Father    Prostate cancer Maternal Uncle 56   Cancer Maternal Aunt 63    d. from "female cancer" possibly cervical   Prostate cancer Paternal Uncle    Prostate cancer Maternal Grandfather 20   Prostate cancer Maternal Uncle 80   Prostate cancer Paternal Uncle    Breast cancer Cousin 30    mat 1st cousin    Ms. Croson's ancestry is of African American descent. There is no known Jewish ancestry or consanguinity.  GENETIC COUNSELING ASSESSMENT: Ashley Lindsey is a 51 y.o. female with a personal and family history of cancer, which includes breast cancer and prostate cancer in several family members on the maternal side of the family.  [name] family history is somewhat suggestive of a hereditary predisposition to cancer  and we, therefore, discussed and recommended the following at today's visit.   DISCUSSION: We reviewed the characteristics, features and inheritance patterns of hereditary cancer syndromes. We also discussed genetic testing, including the appropriate family members to test, the process of testing, insurance coverage and turn-around-time for results. We recommended Ashley Lindsey pursue genetic testing for a breast and ovarian cancer panel through [facility]. Ashley Lindsey Medicare coverage begins October 21, 2012 and she meets published Medicare criteria for coverage of genetic testing. We recommended she have a blood sample drawn today and sent to the lab to hold until coverage becomes effective.   PLAN: Ashley Lindsey wished to pursue genetic testing and the blood sample will be sent to [facility] for analysis of the breast and ovarian  cancer panel once her Medicare coverage takes effect (October 21, 2012).  We discussed the implications of a positive, negative and/ or variant of uncertain significance genetic test result. Results should be available within approximately 7 weeks time after the testing begins, at which point they will be disclosed by telephone to Ashley Lindsey, as will any additional recommendations warranted by these results. Ashley Lindsey will receive a summary of her genetic counseling visit and a copy of her results once available. This information will also be available in Epic. Based on Ms. Pickards family history, we recommended her maternal first cousin, who was diagnosed with breast cancer at age 6, have genetic counseling and testing. [name] will speak with this family member and let us know if we can be of any assistance in coordinating genetic counseling and/or testing. In the meantime, we recommended Ashley Lindsey continue to follow the cancer screening guidelines given to he by her primary provider. We encouraged Ashley Lindsey to remain in contact with cancer genetics annually so that we can continuously update the family history and inform her of any changes in cancer genetics and testing that may be of benefit for this family. Ms.  Lindsey questions were answered to her satisfaction today. Our contact information was provided should additional questions or concerns arise. Thank you for the referral and allowing [place] to share in the care of your patient.   The patient was seen for a total of 35 minutes, greater than 50% of which was spent face-to-face counseling.  This patient was discussed with Dr. Drue Second and she agrees with the above.    _______________________________________________________________________ For Office Staff:  Number of people involved in session: 2 Was an Intern/ student involved with case: not applicable

## 2012-09-08 NOTE — Progress Notes (Signed)
Radiation Oncology         [phone] ________________________________  Name: Ashley Lindsey MRN: [id number]  Date: 09/08/2012  DOB: [birth year]  Follow-Up Visit Note  CC: Alva Garnet., MD  Ernestene Mention, MD  Diagnosis:   Bilateral breast cancer  Interval Since Last Radiation:  One year and 11 months for radiation treatments directed at the right breast  Narrative:  The patient returns today for routine follow-up.  She has been doing recently well except for problems with lymphedema in her left arm. She did see Dr. Darnelle Catalan recently and has been set up with physical therapy. She recently had a sleeve developed.  She does complain of a cramping type pain in both breast areas. This is relieved with Flexeril and a heating pad.  She denies any nipple discharge or bleeding.    The patient is on permanent disability in light of her medical issues.                        ALLERGIES:  is allergic to penicillins.  Meds: Current Outpatient Prescriptions  Medication Sig Dispense Refill  . ALPRAZolam (XANAX) 1 MG tablet Take 1 mg by mouth 3 (three) times daily as needed. anxiety      . butalbital-aspirin-caffeine (FIORINAL) 50-325-40 MG per capsule Take 1 capsule by mouth 3 (three) times daily as needed. For headache      . cyclobenzaprine (FLEXERIL) 10 MG tablet Take 10 mg by mouth 3 (three) times daily as needed for muscle spasms.       . diazepam (VALIUM) 5 MG tablet Take 1 tablet (5 mg total) by mouth every 8 (eight) hours as needed (muscle spasm).  12 tablet  0  . doxycycline (VIBRA-TABS) 100 MG tablet Take 1 tablet (100 mg total) by mouth daily.  10 tablet  0  . DULoxetine (CYMBALTA) 20 MG capsule Take 1 capsule (20 mg total) by mouth daily.  30 capsule  3  . esomeprazole (NEXIUM) 40 MG capsule Take 40 mg by mouth daily before breakfast.      . fish oil-omega-3 fatty acids 1000 MG capsule Take 1 capsule by mouth daily with breakfast.       . hydrochlorothiazide (HYDRODIURIL) 25 MG  tablet Take 25 mg by mouth at bedtime.       Marland Kitchen ibuprofen (ADVIL,MOTRIN) 800 MG tablet Take 800 mg by mouth every 8 (eight) hours as needed. For pain      . lisinopril (PRINIVIL,ZESTRIL) 10 MG tablet Take 10 mg by mouth daily.      . magnesium oxide (MAG-OX) 400 MG tablet Take 400 mg by mouth daily with breakfast.       . Multiple Vitamin (MULTIVITAMIN) tablet Take 1 tablet by mouth daily with breakfast.       . naproxen sodium (ALEVE) 220 MG tablet Take 220 mg by mouth 2 (two) times daily as needed (moderate pain.).       Marland Kitchen OVER THE COUNTER MEDICATION Apply 1 application topically 2 (two) times daily. TYLENOL CREAM TOPICALLY      . potassium chloride (K-DUR) 10 MEQ tablet Take 10 mEq by mouth daily with breakfast.       . pregabalin (LYRICA) 75 MG capsule Take 1 capsule (75 mg total) by mouth 2 (two) times daily.  60 capsule  1  . Probiotic Product (ALIGN PO) Take 1 capsule by mouth at bedtime.       . senna (SENOKOT) 8.6 MG tablet Take  1 tablet by mouth every other day.        No current facility-administered medications for this encounter.    Physical Findings: The patient is in no acute distress. Patient is alert and oriented.  weight is 246 lb 8 oz (111.812 kg). Her oral temperature is 97.6 F (36.4 C). Her blood pressure is 118/78 and her pulse is 81. Her respiration is 20. Marland Kitchen  No palpable supraclavicular or axillary adenopathy. The lungs are clear to auscultation. The heart has a regular rhythm and rate. Patient does have limitation of her arm movement along the left side. Right arm mobility is good. Examination left breast reveals some thickening in the upper central aspect of the breast but no dominant masses appreciated nipple discharge or bleeding.  Examination of the right breast reveals some hyperpigmentation changes. There is also a thickening in the upper central aspect of the right breast which is tender with palpation. There is no obvious dominant mass appreciated. Patient is  scheduled for mammograms later this month.  Lab Findings: Lab Results  Component Value Date   WBC 7.2 03/01/2012   HGB 12.1 03/01/2012   HCT 37.6 03/01/2012   MCV 85.6 03/01/2012   PLT 311 03/01/2012      Radiographic Findings:  Mammogram scheduled for May 28 No results found.  Impression:  The patient is recovering from the effects of radiation.  No obvious signs of recurrence. Thickening noted in the upper central aspect of the breast and mammogram scheduled as above  Plan:  When necessary followup in radiation oncology. Patient will continue close followup in medical oncology and surgery.  _____________________________________  -----------------------------------  Billie Lade, PhD, MD

## 2012-09-08 NOTE — Progress Notes (Signed)
Pt states she occasionally has "muscle spasms in her breasts that wake her up at night". She takes Flexeril, Ibuprofen prn. Pt states her PCP feels this may be due to her fibromyalgia. Pt scheduled for mammogram 09/17/12. Pt denies loss of appetite.

## 2012-09-10 ENCOUNTER — Ambulatory Visit: Payer: Medicaid Other | Attending: Oncology | Admitting: Physical Therapy

## 2012-09-17 ENCOUNTER — Ambulatory Visit: Payer: Self-pay | Admitting: Nurse Practitioner

## 2012-09-18 ENCOUNTER — Telehealth: Payer: Self-pay | Admitting: Diagnostic Neuroimaging

## 2012-09-22 ENCOUNTER — Ambulatory Visit: Payer: Medicaid Other | Attending: Oncology | Admitting: Physical Therapy

## 2012-10-01 ENCOUNTER — Other Ambulatory Visit: Payer: Self-pay | Admitting: Oncology

## 2012-10-16 ENCOUNTER — Other Ambulatory Visit: Payer: Self-pay | Admitting: *Deleted

## 2012-10-20 ENCOUNTER — Telehealth: Payer: Self-pay | Admitting: *Deleted

## 2012-10-20 NOTE — Telephone Encounter (Signed)
Message received from pt stating she needs an appointment with the doctor so she can get her mammogram. " when I called the mammogram they said I should get an MRI because my breast are sensitive ".  Pt left a return call number as [phone].  This RN returned call and obtianed an identified VM. Detailed message left informing pt mammogram has been ordered but she needs to see her primary MD under her medicaid policy to obtain referral for mammogram to be scheduled.

## 2012-10-21 ENCOUNTER — Telehealth: Payer: Self-pay | Admitting: *Deleted

## 2012-10-21 NOTE — Telephone Encounter (Signed)
Message left by pt stating she received a call from nurse at Dr Darrall Dears but did not get a message. Return call number left as [phone].  This RN returned call and spoke with Ashley Lindsey. Per discussion this RN reiterated pt need to contact primary MD per medicaid requirements for referral for mammogram to be released.  Ashley Lindsey stated she now has [employer] in addition to [facility] and inquired if she still needs to see her primary Md.  This RN stated above is not part of this RN's knowledge and she should call the [facility] or her primary MD to inquire. This RN informed pt as well new insurance is not on file at this office.  Ashley Lindsey also stated concern due to " the mammogram hurts because of the tissue under my arm and I thought an MRI would be better ". This RN discussed MRI's are only beneficial with an mammogram- and though mammogram may be momentarily uncomfortable they do not damage tissue by the pressure they exert.  [name] verbalized understanding including her additional inquiry regarding referral to lymphedema clinic be renewed " because my mom was really sick and I could not go ". This RN informed Ashley Lindsey new referral was faxed and need for her to contact them ASAP to schedule due to if they cannot schedule her referral will be outdated again.

## 2012-10-30 ENCOUNTER — Ambulatory Visit: Payer: Medicaid Other | Attending: Oncology | Admitting: Physical Therapy

## 2012-10-31 ENCOUNTER — Other Ambulatory Visit: Payer: Self-pay | Admitting: Oncology

## 2012-10-31 DIAGNOSIS — Z853 Personal history of malignant neoplasm of breast: Secondary | ICD-10-CM

## 2012-11-17 ENCOUNTER — Encounter: Payer: Self-pay | Admitting: Genetic Counselor

## 2012-11-19 ENCOUNTER — Telehealth: Payer: Self-pay | Admitting: Oncology

## 2012-12-12 ENCOUNTER — Inpatient Hospital Stay: Admission: RE | Admit: 2012-12-12 | Payer: Medicaid Other | Source: Ambulatory Visit

## 2012-12-12 ENCOUNTER — Other Ambulatory Visit: Payer: Self-pay | Admitting: Oncology

## 2012-12-12 DIAGNOSIS — Z853 Personal history of malignant neoplasm of breast: Secondary | ICD-10-CM

## 2013-01-06 ENCOUNTER — Observation Stay (HOSPITAL_COMMUNITY)
Admission: EM | Admit: 2013-01-06 | Discharge: 2013-01-07 | Disposition: A | Discharge status: Home / Self Care | Payer: Medicare Other | Attending: Internal Medicine | Admitting: Internal Medicine

## 2013-01-06 ENCOUNTER — Encounter (HOSPITAL_COMMUNITY): Payer: Self-pay | Admitting: Radiology

## 2013-01-06 ENCOUNTER — Emergency Department (HOSPITAL_COMMUNITY): Payer: Medicare Other

## 2013-01-06 DIAGNOSIS — M25519 Pain in unspecified shoulder: Secondary | ICD-10-CM | POA: Insufficient documentation

## 2013-01-06 DIAGNOSIS — IMO0001 Reserved for inherently not codable concepts without codable children: Secondary | ICD-10-CM | POA: Insufficient documentation

## 2013-01-06 DIAGNOSIS — R0789 Other chest pain: Principal | ICD-10-CM | POA: Diagnosis present

## 2013-01-06 DIAGNOSIS — G4730 Sleep apnea, unspecified: Secondary | ICD-10-CM | POA: Insufficient documentation

## 2013-01-06 DIAGNOSIS — Z720 Tobacco use: Secondary | ICD-10-CM

## 2013-01-06 DIAGNOSIS — Z923 Personal history of irradiation: Secondary | ICD-10-CM

## 2013-01-06 DIAGNOSIS — Z79899 Other long term (current) drug therapy: Secondary | ICD-10-CM | POA: Insufficient documentation

## 2013-01-06 DIAGNOSIS — I10 Essential (primary) hypertension: Secondary | ICD-10-CM | POA: Insufficient documentation

## 2013-01-06 DIAGNOSIS — M79609 Pain in unspecified limb: Secondary | ICD-10-CM | POA: Insufficient documentation

## 2013-01-06 DIAGNOSIS — R0602 Shortness of breath: Secondary | ICD-10-CM | POA: Insufficient documentation

## 2013-01-06 DIAGNOSIS — Z853 Personal history of malignant neoplasm of breast: Secondary | ICD-10-CM

## 2013-01-06 DIAGNOSIS — C50919 Malignant neoplasm of unspecified site of unspecified female breast: Secondary | ICD-10-CM

## 2013-01-06 DIAGNOSIS — Z803 Family history of malignant neoplasm of breast: Secondary | ICD-10-CM

## 2013-01-06 DIAGNOSIS — F17200 Nicotine dependence, unspecified, uncomplicated: Secondary | ICD-10-CM | POA: Insufficient documentation

## 2013-01-06 LAB — BASIC METABOLIC PANEL
BUN: 13 mg/dL (ref 6–23)
CO2: 24 mEq/L (ref 19–32)
Calcium: 9.5 mg/dL (ref 8.4–10.5)
Chloride: 101 mEq/L (ref 96–112)
Creatinine, Ser: 0.59 mg/dL (ref 0.50–1.10)
GFR calc Af Amer: >90 mL/min (ref >90)
GFR calc non Af Amer: >90 mL/min (ref >90)
Glucose, Bld: 99 mg/dL (ref 70–99)
Potassium: 3.9 mEq/L (ref 3.5–5.1)
Sodium: 136 mEq/L (ref 135–145)

## 2013-01-06 LAB — CBC
HCT: 41.7 % (ref 36.0–46.0)
Hemoglobin: 13.5 g/dL (ref 12.0–15.0)
MCH: 27.8 pg (ref 26.0–34.0)
MCHC: 32.4 g/dL (ref 30.0–36.0)
MCV: 86 fL (ref 78.0–100.0)
Platelets: 301 10*3/uL (ref 150–400)
RBC: 4.85 MIL/uL (ref 3.87–5.11)
RDW: 13.7 % (ref 11.5–15.5)
WBC: 10 10*3/uL (ref 4.0–10.5)

## 2013-01-06 LAB — COMPREHENSIVE METABOLIC PANEL
ALT: 20 U/L (ref 0–35)
AST: 16 U/L (ref 0–37)
Albumin: 3.1 g/dL — ABNORMAL LOW (ref 3.5–5.2)
Alkaline Phosphatase: 94 U/L (ref 39–117)
BUN: 12 mg/dL (ref 6–23)
CO2: 27 mEq/L (ref 19–32)
Calcium: 8.4 mg/dL (ref 8.4–10.5)
Chloride: 103 mEq/L (ref 96–112)
Creatinine, Ser: 0.66 mg/dL (ref 0.50–1.10)
GFR calc Af Amer: >90 mL/min (ref >90)
GFR calc non Af Amer: >90 mL/min (ref >90)
Glucose, Bld: 96 mg/dL (ref 70–99)
Potassium: 4 mEq/L (ref 3.5–5.1)
Sodium: 137 mEq/L (ref 135–145)
Total Bilirubin: 0.1 mg/dL — ABNORMAL LOW (ref 0.3–1.2)
Total Protein: 6.5 g/dL (ref 6.0–8.3)

## 2013-01-06 LAB — POCT I-STAT TROPONIN I: Troponin i, poc: 0 ng/mL (ref 0.00–0.08)

## 2013-01-06 LAB — TROPONIN I: Troponin I: <0.3 ng/mL (ref <0.30)

## 2013-01-06 LAB — PRO B NATRIURETIC PEPTIDE: Pro B Natriuretic peptide (BNP): 6.2 pg/mL (ref 0–125)

## 2013-01-06 MED ORDER — SODIUM CHLORIDE 0.9 % IJ SOLN
3.0000 mL | Freq: Two times a day (BID) | INTRAMUSCULAR | Status: DC
  Administered 2013-01-06: 3 mL via INTRAVENOUS

## 2013-01-06 MED ORDER — IOHEXOL 350 MG/ML SOLN
100.0000 mL | Freq: Once | INTRAVENOUS | Status: AC | PRN
  Administered 2013-01-06: 100 mL via INTRAVENOUS

## 2013-01-06 MED ORDER — DIAZEPAM 5 MG PO TABS: 5.0000 mg | ORAL_TABLET | Freq: Three times a day (TID) | ORAL | Status: DC | PRN

## 2013-01-06 MED ORDER — ONDANSETRON HCL 4 MG/2ML IJ SOLN
4.0000 mg | Freq: Once | INTRAMUSCULAR | Status: AC
  Administered 2013-01-06: 4 mg via INTRAVENOUS
  Filled 2013-01-06: qty 2

## 2013-01-06 MED ORDER — DULOXETINE HCL 20 MG PO CPEP
20.0000 mg | ORAL_CAPSULE | Freq: Every day | ORAL | Status: DC
  Filled 2013-01-06 (×2): qty 1

## 2013-01-06 MED ORDER — MORPHINE SULFATE 2 MG/ML IJ SOLN
2.0000 mg | INTRAMUSCULAR | Status: DC | PRN
  Administered 2013-01-06 – 2013-01-07 (×2): 2 mg via INTRAVENOUS
  Filled 2013-01-06 (×2): qty 1

## 2013-01-06 MED ORDER — PANTOPRAZOLE SODIUM 40 MG PO TBEC: 80.0000 mg | DELAYED_RELEASE_TABLET | Freq: Every day | ORAL | Status: DC

## 2013-01-06 MED ORDER — ENOXAPARIN SODIUM 60 MG/0.6ML ~~LOC~~ SOLN
55.0000 mg | SUBCUTANEOUS | Status: DC
  Administered 2013-01-07: 55 mg via SUBCUTANEOUS
  Filled 2013-01-06 (×2): qty 0.6

## 2013-01-06 MED ORDER — TAMOXIFEN CITRATE 10 MG PO TABS
10.0000 mg | ORAL_TABLET | Freq: Every day | ORAL | Status: DC
  Administered 2013-01-07: 10 mg via ORAL
  Filled 2013-01-06: qty 1

## 2013-01-06 MED ORDER — MORPHINE SULFATE 4 MG/ML IJ SOLN
4.0000 mg | INTRAMUSCULAR | Status: AC
  Administered 2013-01-06: 4 mg via INTRAVENOUS
  Filled 2013-01-06: qty 1

## 2013-01-06 MED ORDER — ENOXAPARIN SODIUM 40 MG/0.4ML ~~LOC~~ SOLN
40.0000 mg | SUBCUTANEOUS | Status: DC
  Filled 2013-01-06: qty 0.4

## 2013-01-06 MED ORDER — SODIUM CHLORIDE 0.9 % IV BOLUS (SEPSIS)
500.0000 mL | Freq: Once | INTRAVENOUS | Status: AC
  Administered 2013-01-06: 500 mL via INTRAVENOUS

## 2013-01-06 MED ORDER — CYCLOBENZAPRINE HCL 10 MG PO TABS
10.0000 mg | ORAL_TABLET | Freq: Three times a day (TID) | ORAL | Status: DC | PRN
  Administered 2013-01-07: 10 mg via ORAL
  Filled 2013-01-06: qty 1

## 2013-01-06 MED ORDER — PREGABALIN 75 MG PO CAPS
75.0000 mg | ORAL_CAPSULE | Freq: Two times a day (BID) | ORAL | Status: DC
  Administered 2013-01-07 (×2): 75 mg via ORAL
  Filled 2013-01-06 (×2): qty 1

## 2013-01-06 MED ORDER — LISINOPRIL 10 MG PO TABS
10.0000 mg | ORAL_TABLET | Freq: Every day | ORAL | Status: DC
  Administered 2013-01-07: 10 mg via ORAL
  Filled 2013-01-06: qty 1

## 2013-01-06 MED ORDER — PANTOPRAZOLE SODIUM 40 MG PO TBEC
40.0000 mg | DELAYED_RELEASE_TABLET | Freq: Two times a day (BID) | ORAL | Status: DC
  Administered 2013-01-07 (×2): 40 mg via ORAL
  Filled 2013-01-06 (×3): qty 1

## 2013-01-06 NOTE — ED Notes (Signed)
Bedside report received from previous RN, Natalie. 

## 2013-01-06 NOTE — Progress Notes (Signed)
PHARMACY - LOVENOX  Pharmacy asked to adjust Lovenox dose for VTE prophylaxis as needed.  52 yr female with CrCl ~ 97 ml/min with TBW = 108.7 kg and BMI = 41  As BMI > 30, will adjust dose to 0.5 mg/kg/q24h  PLAN:  Change Lovenox to 55mg  sq q24h  Terrilee Files, PharmD

## 2013-01-06 NOTE — ED Notes (Signed)
Patient with Hx of breast cancer reports to emergency department for c/o new onset left sided chest pain radiating to left arm. Patient states that she has had central chest pain before, but never left sided. Patient breathing is labored.

## 2013-01-06 NOTE — H&P (Signed)
[facility] History and Physical  AMULYA QUINTIN WUJ:[id number] DOB: 14-Jan-1961 DOA: 01/06/2013  Referring physician: Dr. Romeo Apple PCP: Alva Garnet., MD  Specialists: none  Chief Complaint: chest pain  HPI: Ashley Lindsey is a 52 y.o. female has a past medical history significant for breast cancer followed by Dr. Darnelle Catalan, stable per last oncology note, history of fibromyalgia, hypertension, presents to the emergency room with a chief complaint of chest pain left shoulder and left arm pain. She states that her chest pain has been on and off for the past 2 days. She describes her chest pain is sharp. She has a history of costochondritis with midsternal chest pain, however at this time she endorses left-sided chest pain. She describes her pain as sharp. Pain is worse with movement and worse with deep breathing. Her chest pain is reproducible with palpation. Her chest pain irradiates on left arm. Denies fever/chills, has no GI complaints such as nausea/vomiting or diarrhea. Denies headache, lightheadedness or dizziness. In the ED, EKG shows sinus rhythm, initial troponin is negative. She underwent a CTPA which was negative for a PE. Patient with persistent shoulder pain in theED. TRH has been asked for admission for CP r/o.  Review of Systems: as per HPI otherwise negative.  Past Medical History  Diagnosis Date  . Abdominal cramps   . Muscle spasms of head and/or neck     following to the breast  . Fatigue   . Passed out     4th of july  . Migraines   . Chronic headaches   . Arthritis   . Cancer     s/p radiation- last dose 6/12//has been off Tamoxifen 8/12  . Sleep apnea     STOP BANG SCORE 5  . Lymphedema     RIGHT ARM-////  STATES USE LEFT ARM FOR BP'S  . S/P radiation therapy 08/19/07 - 10/10/07    Left Breast/5040 cGy/28 fractions with Boost for a toatl dose of 6300 dGy  . S/P radiation therapy 08/14/10 -10/03/10    right breast  . Costochondritis    Past Surgical  History  Procedure Laterality Date  . Breast lumpectomy  2009/2012    LEFT/RIGHT with lymph node dissection  . Abdominal hysterectomy      with left salpingooophorectomy  . Knee arthroscopy      right  . Oophorectomy  2013   Social History:  reports that she has been smoking Cigarettes.  She has a .25 pack-year smoking history. She does not have any smokeless tobacco history on file. She reports that  drinks alcohol. She reports that she does not use illicit drugs.  Allergies  Allergen Reactions  . Aspirin     unknown  . Penicillins Hives and Swelling    Family History  Problem Relation Age of Onset  . Breast cancer Mother 77  . Heart disease Father   . Prostate cancer Maternal Uncle 78  . Cancer Maternal Aunt 63    d. from "female cancer" possibly cervical  . Prostate cancer Paternal Uncle   . Prostate cancer Maternal Grandfather 37  . Prostate cancer Maternal Uncle 80  . Prostate cancer Paternal Uncle   . Breast cancer Cousin 30    mat 1st cousin   Prior to Admission medications   Medication Sig Start Date End Date Taking? Authorizing Provider  ALPRAZolam Prudy Feeler) 1 MG tablet Take 1 mg by mouth 3 (three) times daily as needed. anxiety   Yes Historical Provider, MD  butalbital-aspirin-caffeine [facility]) [phone]  MG per capsule Take 1 capsule by mouth 3 (three) times daily as needed. For headache   Yes Historical Provider, MD  cyclobenzaprine (FLEXERIL) 10 MG tablet Take 10 mg by mouth 3 (three) times daily as needed for muscle spasms.    Yes Historical Provider, MD  diazepam (VALIUM) 5 MG tablet Take 1 tablet (5 mg total) by mouth every 8 (eight) hours as needed (muscle spasm). 03/01/12  Yes Jamesetta Orleans Lawyer, PA-C  DULoxetine (CYMBALTA) 20 MG capsule Take 1 capsule (20 mg total) by mouth daily. 04/21/12  Yes Pierce Crane, MD  esomeprazole (NEXIUM) 40 MG capsule Take 40 mg by mouth daily before breakfast.   Yes Historical Provider, MD  fish oil-omega-3 fatty acids 1000 MG  capsule Take 1 capsule by mouth daily with breakfast.    Yes Historical Provider, MD  hydrochlorothiazide (HYDRODIURIL) 25 MG tablet Take 25 mg by mouth at bedtime.    Yes Historical Provider, MD  ibuprofen (ADVIL,MOTRIN) 800 MG tablet Take 800 mg by mouth every 8 (eight) hours as needed. For pain   Yes Historical Provider, MD  lisinopril (PRINIVIL,ZESTRIL) 10 MG tablet Take 10 mg by mouth daily.   Yes Historical Provider, MD  magnesium oxide (MAG-OX) 400 MG tablet Take 400 mg by mouth daily with breakfast.    Yes Historical Provider, MD  Multiple Vitamin (MULTIVITAMIN) tablet Take 1 tablet by mouth daily with breakfast.    Yes Historical Provider, MD  pregabalin (LYRICA) 75 MG capsule Take 1 capsule (75 mg total) by mouth 2 (two) times daily. 03/14/12  Yes Pierce Crane, MD  Probiotic Product (ALIGN PO) Take 1 capsule by mouth at bedtime.    Yes Historical Provider, MD  senna (SENOKOT) 8.6 MG tablet Take 1 tablet by mouth every other day.    Yes Historical Provider, MD  tamoxifen (NOLVADEX) 10 MG tablet Take 10 mg by mouth daily.   Yes Historical Provider, MD   Physical Exam: Filed Vitals:   01/06/13 1600 01/06/13 1715 01/06/13 1830 01/06/13 1847  BP:    116/75  Pulse: 85 91 88   Temp:      TempSrc:      Resp: 16 16 19    SpO2: 95% 99% 97%      General:  In mild distress due to chest/shoulder pain.   Eyes: no scleral icterus  ENT: moist oropharynx  Neck: supple, no JVD  Cardiovascular: regular rate without MRG; 2+ peripheral pulses  Respiratory: CTA biL, good air movement without wheezing, rhonchi or crackles  Abdomen: soft, non tender to palpation, positive bowel sounds, no guarding, no rebound  Skin: no rashes  Musculoskeletal: no peripheral edema; significant chest pain with palpation left sided anterior chest. Shoulder pain exacerbated by palpation. Decreased ROM shoulder due to pain.   Psychiatric: normal mood and affect  Neurologic: non focal  Labs on Admission:   Basic Metabolic Panel:  Recent Labs Lab 01/06/13 1449 01/06/13 1835  NA 136 137  K 3.9 4.0  CL 101 103  CO2 24 27  GLUCOSE 99 96  BUN 13 12  CREATININE 0.59 0.66  CALCIUM 9.5 8.4   Liver Function Tests:  Recent Labs Lab 01/06/13 1835  AST 16  ALT 20  ALKPHOS 94  BILITOT 0.1*  PROT 6.5  ALBUMIN 3.1*    CBC:  Recent Labs Lab 01/06/13 1449  WBC 10.0  HGB 13.5  HCT 41.7  MCV 86.0  PLT 301   Cardiac Enzymes:  Recent Labs Lab 01/06/13 1835  TROPONINI <  0.30    BNP (last 3 results)  Recent Labs  01/06/13 1449  PROBNP 6.2   Radiological Exams on Admission: Ct Angio Chest Pe W/cm &/or Wo Cm  01/06/2013   *RADIOLOGY REPORT*  Clinical Data: Short of breath, chest pain, history of breast cancer  CT ANGIOGRAPHY CHEST  Technique:  Multidetector CT imaging of the chest using the standard protocol during bolus administration of intravenous contrast. Multiplanar reconstructed images including MIPs were obtained and reviewed to evaluate the vascular anatomy.  Contrast: OMNIPAQUE IOHEXOL 350 MG/ML SOLN  Comparison: Chest x-ray obtained earlier today at 1509 hours  Findings:  Mediastinum: Unremarkable CT appearance of the thyroid gland.  No suspicious mediastinal or hilar adenopathy.  No soft tissue mediastinal mass.  The thoracic esophagus is unremarkable.  Heart/Vascular: Limited evaluation of the pulmonary arteries secondary to beam hardening artifact related to patient body habitus and diffuse respiratory motion.  There is adequate opacification of the main and central pulmonary arteries to the proximal lobe are level.  Evaluation of the segmental and subsegmental pulmonary arteries is significantly limited.  No central filling defect to suggest clinically significant pulmonary embolus. There is a bovine configuration of the aortic arch (two vessel arch with common origin of the brachiocephalic and left common carotid arteries), a normal anatomic variant.  No aneurysmal  dilatation.  Decreased AP diameter of the chest results and elongation of the heart.  The heart itself is likely within normal limits although it is at the upper limits.  Atherosclerotic vascular calcification noted within the coronary arteries.  No pericardial effusion.  Lungs/Pleura: No pleural effusion.  Respiratory motion limits evaluation for small pulmonary nodules. Scattered pulmonary nodules ranging in size from two - 4 mm in the right upper lobe and minor fissure.  Atelectasis noted in the lingula, right middle lobe and bilateral lower lobes.  Upper Abdomen: Limited evaluation of the upper abdomen demonstrates no acute abnormality.  Bones: No acute fracture or aggressive appearing lytic or blastic osseous lesion.  IMPRESSION:  1.  No clinically significant central pulmonary embolus to the lobar level.  Evaluation of the more distal pulmonary arteries limited by multiple factors as detailed above.  2.  Atherosclerosis including coronary artery disease. 3.  Low inspiratory volumes with areas of subsegmental atelectasis in the right middle lobe, lingula and bilateral lower lobes. 4.  Heart is at the upper limits of normal for size. 5.  Congenitally small AP diameter of the chest.   Original Report Authenticated By: Malachy Moan, M.D.   Dg Chest Port 1 View  01/06/2013   *RADIOLOGY REPORT*  Clinical Data: Chest pain.  Hypertension.  PORTABLE CHEST - 1 VIEW  Comparison: 03/01/2012.  Findings: Low volume chest with bilateral basilar atelectasis.  Low volumes accentuate the cardiopericardial silhouette. Monitoring leads are projected over the chest.  No airspace disease or pleural effusion identified.  IMPRESSION: Low volume chest.   Original Report Authenticated By: Andreas Newport, M.D.    EKG: Independently reviewed.   Assessment/Plan Principal Problem:   Atypical chest pain Active Problems:   History of breast cancer in female   S/P radiation therapy    Chest pain - atypical, reproduced  with palpation. Will admit for obs for 3 troponin r/o. She is a smoker and has atherosclerosis/CAD visualized on the CT, will likely benefit from stress test, perhaps as an outpatient. Will consider cardiology consult if persistent chest pain, positive cardiac enzymes or abnormal 2D echo.  History of breast cancer - followed up as  an outpatient. No current chemo. Tobacco abuse - counseled for cessation HTN - continue home medications DVT Prophylaxis - Lovenox.    Code Status: Full  Family Communication: none  Disposition Plan: obs  Time spent: 38  Margarito Dehaas M. Elvera Lennox, MD [facility] Pager [phone]  If 7PM-7AM, please contact night-coverage [email or web address] Password [email or web address] 01/06/2013, 8:13 PM

## 2013-01-06 NOTE — ED Provider Notes (Signed)
CSN: [id number]     Arrival date & time 01/06/13  1429 History   First MD Initiated Contact with Patient 01/06/13 1453     Chief Complaint  Patient presents with  . Chest Pain   (Consider location/radiation/quality/duration/timing/severity/associated sxs/prior Treatment) Patient is a 51 y.o. female presenting with chest pain. The history is provided by the patient.  Chest Pain Pain location:  L chest Pain quality: sharp   Pain radiates to:  L arm Pain radiates to the back: no   Pain severity:  Moderate Onset quality:  Gradual Duration:  2 days Timing:  Intermittent Progression:  Worsening Chronicity:  New Relieved by:  Nothing Worsened by:  Nothing tried Ineffective treatments:  None tried Associated symptoms: shortness of breath   Associated symptoms: no abdominal pain, no back pain, no cough, no dizziness, no fatigue, no fever, no headache, no nausea and not vomiting     Past Medical History  Diagnosis Date  . Abdominal cramps   . Muscle spasms of head and/or neck     following to the breast  . Fatigue   . Passed out     4th of july  . Migraines   . Chronic headaches   . Arthritis   . Cancer     s/p radiation- last dose 6/12//has been off Tamoxifen 8/12  . Sleep apnea     STOP BANG SCORE 5  . Lymphedema     RIGHT ARM-////  STATES USE LEFT ARM FOR BP'S  . S/P radiation therapy 08/19/07 - 10/10/07    Left Breast/5040 cGy/28 fractions with Boost for a toatl dose of 6300 dGy  . S/P radiation therapy 08/14/10 -10/03/10    right breast  . Costochondritis    Past Surgical History  Procedure Laterality Date  . Breast lumpectomy  2009/2012    LEFT/RIGHT with lymph node dissection  . Abdominal hysterectomy      with left salpingooophorectomy  . Knee arthroscopy      right  . Oophorectomy  2013   Family History  Problem Relation Age of Onset  . Breast cancer Mother 38  . Heart disease Father   . Prostate cancer Maternal Uncle 78  . Cancer Maternal Aunt 63    d.  from "female cancer" possibly cervical  . Prostate cancer Paternal Uncle   . Prostate cancer Maternal Grandfather 67  . Prostate cancer Maternal Uncle 80  . Prostate cancer Paternal Uncle   . Breast cancer Cousin 30    mat 1st cousin   History  Substance Use Topics  . Smoking status: Current Every Day Smoker -- 0.25 packs/day for 1 years    Types: Cigarettes  . Smokeless tobacco: Not on file  . Alcohol Use: Yes     Comment: 3 x a week   OB History   Grav Para Term Preterm Abortions TAB SAB Ect Mult Living                 Review of Systems  Constitutional: Negative for fever and fatigue.  HENT: Negative for congestion, drooling and neck pain.   Eyes: Negative for pain.  Respiratory: Positive for shortness of breath. Negative for cough.   Cardiovascular: Positive for chest pain.  Gastrointestinal: Negative for nausea, vomiting, abdominal pain and diarrhea.  Genitourinary: Negative for dysuria and hematuria.  Musculoskeletal: Negative for back pain and gait problem.  Skin: Negative for color change.  Neurological: Negative for dizziness and headaches.  Hematological: Negative for adenopathy.  Psychiatric/Behavioral: Negative for  behavioral problems.  All other systems reviewed and are negative.    Allergies  Aspirin and Penicillins  Home Medications   Current Outpatient Rx  Name  Route  Sig  Dispense  Refill  . ALPRAZolam (XANAX) 1 MG tablet   Oral   Take 1 mg by mouth 3 (three) times daily as needed. anxiety         . butalbital-aspirin-caffeine (FIORINAL) 50-325-40 MG per capsule   Oral   Take 1 capsule by mouth 3 (three) times daily as needed. For headache         . cyclobenzaprine (FLEXERIL) 10 MG tablet   Oral   Take 10 mg by mouth 3 (three) times daily as needed for muscle spasms.          . diazepam (VALIUM) 5 MG tablet   Oral   Take 1 tablet (5 mg total) by mouth every 8 (eight) hours as needed (muscle spasm).   12 tablet   0   . DULoxetine  (CYMBALTA) 20 MG capsule   Oral   Take 1 capsule (20 mg total) by mouth daily.   30 capsule   3   . esomeprazole (NEXIUM) 40 MG capsule   Oral   Take 40 mg by mouth daily before breakfast.         . fish oil-omega-3 fatty acids 1000 MG capsule   Oral   Take 1 capsule by mouth daily with breakfast.          . hydrochlorothiazide (HYDRODIURIL) 25 MG tablet   Oral   Take 25 mg by mouth at bedtime.          Marland Kitchen ibuprofen (ADVIL,MOTRIN) 800 MG tablet   Oral   Take 800 mg by mouth every 8 (eight) hours as needed. For pain         . lisinopril (PRINIVIL,ZESTRIL) 10 MG tablet   Oral   Take 10 mg by mouth daily.         . magnesium oxide (MAG-OX) 400 MG tablet   Oral   Take 400 mg by mouth daily with breakfast.          . Multiple Vitamin (MULTIVITAMIN) tablet   Oral   Take 1 tablet by mouth daily with breakfast.          . pregabalin (LYRICA) 75 MG capsule   Oral   Take 1 capsule (75 mg total) by mouth 2 (two) times daily.   60 capsule   1   . Probiotic Product (ALIGN PO)   Oral   Take 1 capsule by mouth at bedtime.          . senna (SENOKOT) 8.6 MG tablet   Oral   Take 1 tablet by mouth every other day.          . tamoxifen (NOLVADEX) 10 MG tablet   Oral   Take 10 mg by mouth daily.          BP 123/88  Pulse 99  Temp(Src) 97.9 F (36.6 C) (Oral)  Resp 20  SpO2 99% Physical Exam  Nursing note and vitals reviewed. Constitutional: She is oriented to person, place, and time. She appears well-developed and well-nourished.  HENT:  Head: Normocephalic.  Mouth/Throat: Oropharynx is clear and moist. No oropharyngeal exudate.  Eyes: Conjunctivae and EOM are normal. Pupils are equal, round, and reactive to light.  Neck: Normal range of motion. Neck supple.  Cardiovascular: Normal rate, regular rhythm, normal heart sounds and intact distal  pulses.  Exam reveals no gallop and no friction rub.   No murmur heard. Pulmonary/Chest: Effort normal and  breath sounds normal. No respiratory distress. She has no wheezes. She exhibits tenderness (chest pain is reproducible with palpation in left upper chest).  Abdominal: Soft. Bowel sounds are normal. There is no tenderness. There is no rebound and no guarding.  Musculoskeletal: Normal range of motion. She exhibits no edema and no tenderness.  No focal ttp or asymmetry to extremities. 2+ distal pulses.   Neurological: She is alert and oriented to person, place, and time.  Skin: Skin is warm and dry.  Psychiatric: She has a normal mood and affect. Her behavior is normal.    ED Course  Procedures (including critical care time) Labs Review Labs Reviewed  CBC  BASIC METABOLIC PANEL  PRO B NATRIURETIC PEPTIDE   Imaging Review Ct Angio Chest Pe W/cm &/or Wo Cm  01/06/2013   *RADIOLOGY REPORT*  Clinical Data: Short of breath, chest pain, history of breast cancer  CT ANGIOGRAPHY CHEST  Technique:  Multidetector CT imaging of the chest using the standard protocol during bolus administration of intravenous contrast. Multiplanar reconstructed images including MIPs were obtained and reviewed to evaluate the vascular anatomy.  Contrast: OMNIPAQUE IOHEXOL 350 MG/ML SOLN  Comparison: Chest x-ray obtained earlier today at 1509 hours  Findings:  Mediastinum: Unremarkable CT appearance of the thyroid gland.  No suspicious mediastinal or hilar adenopathy.  No soft tissue mediastinal mass.  The thoracic esophagus is unremarkable.  Heart/Vascular: Limited evaluation of the pulmonary arteries secondary to beam hardening artifact related to patient body habitus and diffuse respiratory motion.  There is adequate opacification of the main and central pulmonary arteries to the proximal lobe are level.  Evaluation of the segmental and subsegmental pulmonary arteries is significantly limited.  No central filling defect to suggest clinically significant pulmonary embolus. There is a bovine configuration of the aortic arch  (two vessel arch with common origin of the brachiocephalic and left common carotid arteries), a normal anatomic variant.  No aneurysmal dilatation.  Decreased AP diameter of the chest results and elongation of the heart.  The heart itself is likely within normal limits although it is at the upper limits.  Atherosclerotic vascular calcification noted within the coronary arteries.  No pericardial effusion.  Lungs/Pleura: No pleural effusion.  Respiratory motion limits evaluation for small pulmonary nodules. Scattered pulmonary nodules ranging in size from two - 4 mm in the right upper lobe and minor fissure.  Atelectasis noted in the lingula, right middle lobe and bilateral lower lobes.  Upper Abdomen: Limited evaluation of the upper abdomen demonstrates no acute abnormality.  Bones: No acute fracture or aggressive appearing lytic or blastic osseous lesion.  IMPRESSION:  1.  No clinically significant central pulmonary embolus to the lobar level.  Evaluation of the more distal pulmonary arteries limited by multiple factors as detailed above.  2.  Atherosclerosis including coronary artery disease. 3.  Low inspiratory volumes with areas of subsegmental atelectasis in the right middle lobe, lingula and bilateral lower lobes. 4.  Heart is at the upper limits of normal for size. 5.  Congenitally small AP diameter of the chest.   Original Report Authenticated By: Malachy Moan, M.D.   Dg Chest Port 1 View  01/06/2013   *RADIOLOGY REPORT*  Clinical Data: Chest pain.  Hypertension.  PORTABLE CHEST - 1 VIEW  Comparison: 03/01/2012.  Findings: Low volume chest with bilateral basilar atelectasis.  Low volumes accentuate the cardiopericardial  silhouette. Monitoring leads are projected over the chest.  No airspace disease or pleural effusion identified.  IMPRESSION: Low volume chest.   Original Report Authenticated By: Andreas Newport, M.D.      Date: 01/06/2013  Rate: 96  Rhythm: normal sinus rhythm  QRS Axis: normal   Intervals: normal  ST/T Wave abnormalities: nonspecific T wave changes  Conduction Disutrbances:none  Narrative Interpretation: LVH, isolated t wave inversion in lead III  Old EKG Reviewed: changes noted   MDM   1. Atypical chest pain   2. History of breast cancer in female   3. Tobacco abuse    3:06 PM 52 y.o. female with a history of breast cancer currently in remission who presents with intermittent left-sided chest pain radiating from her left arm which began 2 days ago. The patient is afebrile and vital signs are unremarkable here. She notes that she has a history of costochondritis but this is not consistent with that. She notes that pain is worse with inspiration. Will get labs, pain control with morphine, and CT to rule out PE.  CT PE neg. CP is atypical, but ongoing. Will admit to hospitalist for rule out.     Junius Argyle, MD 01/07/13 [phone]

## 2013-01-07 ENCOUNTER — Encounter (HOSPITAL_COMMUNITY): Payer: Self-pay | Admitting: *Deleted

## 2013-01-07 DIAGNOSIS — C50919 Malignant neoplasm of unspecified site of unspecified female breast: Secondary | ICD-10-CM

## 2013-01-07 DIAGNOSIS — I517 Cardiomegaly: Secondary | ICD-10-CM

## 2013-01-07 LAB — TROPONIN I
Troponin I: <0.3 ng/mL (ref <0.30)
Troponin I: <0.3 ng/mL (ref <0.30)

## 2013-01-07 MED ORDER — INFLUENZA VAC SPLIT QUAD 0.5 ML IM SUSP: 0.5000 mL | INTRAMUSCULAR | Status: DC | PRN

## 2013-01-07 MED ORDER — IBUPROFEN 800 MG PO TABS
800.0000 mg | ORAL_TABLET | Freq: Once | ORAL | Status: AC
  Administered 2013-01-07: 800 mg via ORAL
  Filled 2013-01-07: qty 1

## 2013-01-07 MED ORDER — OXYCODONE-ACETAMINOPHEN 5-325 MG PO TABS
1.0000 | ORAL_TABLET | ORAL | Status: DC | PRN
  Administered 2013-01-07: 1 via ORAL
  Filled 2013-01-07: qty 1

## 2013-01-07 MED ORDER — INFLUENZA VAC SPLIT QUAD 0.5 ML IM SUSP: 0.5000 mL | INTRAMUSCULAR | Status: DC

## 2013-01-07 MED ORDER — SENNA 8.6 MG PO TABS: 1.0000 | ORAL_TABLET | Freq: Every day | ORAL | Status: DC

## 2013-01-07 MED ORDER — OXYCODONE-ACETAMINOPHEN 5-325 MG PO TABS: 1.0000 | ORAL_TABLET | ORAL | Status: DC | PRN

## 2013-01-07 MED ORDER — BUTALBITAL-APAP-CAFFEINE 50-325-40 MG PO TABS
1.0000 | ORAL_TABLET | Freq: Once | ORAL | Status: AC
  Administered 2013-01-07: 1 via ORAL
  Filled 2013-01-07: qty 1

## 2013-01-07 MED ORDER — DIAZEPAM 5 MG PO TABS: 5.0000 mg | ORAL_TABLET | Freq: Three times a day (TID) | ORAL | Status: DC | PRN

## 2013-01-07 NOTE — Plan of Care (Signed)
Problem: Consults Goal: Tobacco Cessation referral if indicated Outcome: Progressing Referral needed. Pt states "going to give it up".

## 2013-01-07 NOTE — Care Management Note (Signed)
    Page 1 of 1   01/07/2013     10:51:53 AM   CARE MANAGEMENT NOTE 01/07/2013  Patient:  Ashley Lindsey, Ashley Lindsey   Account Number:  [id number]  Date Initiated:  01/07/2013  Documentation initiated by:  Lanier Clam  Subjective/Objective Assessment:   52 Y/O F ADMITTED W/CHEST PAIN.     Action/Plan:   FROM HOME.HAS PCP,PHARMACY.   Anticipated DC Date:  01/07/2013   Anticipated DC Plan:  HOME/SELF CARE      DC Planning Services  CM consult      Choice offered to / List presented to:             Status of service:  Completed, signed off Medicare Important Message given?   (If response is "NO", the following Medicare IM given date fields will be blank) Date Medicare IM given:   Date Additional Medicare IM given:    Discharge Disposition:  HOME/SELF CARE  Per UR Regulation:  Reviewed for med. necessity/level of care/duration of stay  If discussed at Long Length of Stay Meetings, dates discussed:    Comments:  01/07/13 Ezreal Turay RN,BSN NCM [phone] D/C HOME NO NEEDS OR ORDERS.

## 2013-01-07 NOTE — Plan of Care (Signed)
Problem: Consults Goal: Chest Pain Patient Education (See Patient Education module for education specifics.) Outcome: Progressing Call immediately for chest pain. Observe pt for sob, nausea and sweating.

## 2013-01-07 NOTE — Progress Notes (Signed)
Echocardiogram 2D Echocardiogram has been performed.  Ashley Lindsey 01/07/2013, 11:05 AM

## 2013-01-07 NOTE — Discharge Summary (Addendum)
Physician Discharge Summary  Ashley Lindsey:[id number] DOB: Jan 01, 1961 DOA: 01/06/2013  PCP: Ashley Lindsey., MD  Admit date: 01/06/2013 Discharge date: 01/07/2013  Recommendations for Outpatient Follow-up:  1. Pt will need to follow up with PCP in 1 weeks post discharge 2. Please obtain BMP to evaluate electrolytes and kidney function 3. Please also check CBC to evaluate Hg and Hct levels 4. Please follow up on echocardiogram results   Discharge Diagnoses:  Principal Problem:   Atypical chest pain Active Problems:   History of breast cancer in female   S/P radiation therapy  atypical chest pain - Likely musculoskeletal etiology - Reproducible with palpation and movement - EKG reassuring without acute ST-T wave changes - Troponins negative x3 - CT angiogram chest negative for PE - Blood pressure is controlled, patient hemodynamically stable - Patient was initially treated with IV morphine which will be converted to oral Percocet at the time of discharge - Patient will be given Percocet 5/325mg , #15, 1 every 4 hours when necessary pain, no refills - Patient was encouraged to use her oral diazepam--5 mg, #10, one every 12 hours when necessary muscle spasm, no refills - Patient was instructed to follow up with her primary care physician in one week - She may need an outpatient exercise stress test Hypertension - Patient's blood pressure remained controlled on lisinopril - Continue HCTZ home dose History of breast cancer - Continue tamoxifen - Followup with her established oncologist Fibromyalgia - Patient should continue on her home dose of Lyrica, 75 mg twice a day Tobacco abuse - Tobacco cessation discussed  Discharge Condition: stable  Disposition: home  Diet:heart healthy Wt Readings from Last 3 Encounters:  01/06/13 108.7 kg (239 lb 10.2 oz)  09/08/12 111.812 kg (246 lb 8 oz)  07/15/12 112.265 kg (247 lb 8 oz)    History of present illness:   52 y.o.  female has a past medical history significant for breast cancer followed by Dr. Darnelle Lindsey, stable per last oncology note, history of fibromyalgia, hypertension, presents to the emergency room with a chief complaint of chest pain left shoulder and left arm pain. She states that her chest pain has been constant for the past 2 days. She describes her chest pain is sharp. She has the pain at rest, and it is exacerbated with any kind of movement.  She has a history of costochondritis with midsternal chest pain, however at this time she endorses left-sided chest pain which is different than her usual costochondritis pain. She describes her pain as sharp. Pain is worse with movement and worse with deep breathing. Her chest pain is reproducible with palpation. Her chest pain irradiates on left arm. Denies fever/chills, has no GI complaints such as nausea/vomiting or diarrhea. Denies headache, lightheadedness or dizziness. In the ED, EKG shows sinus rhythm, She underwent a CTA chest which was negative for a PE. Patient with persistent left arm pain and L-chest pain in ED. the patient was given intravenous morphine which gives her some relief. The patient was admitted to the telemetry floor. She was transitioned to oral Percocet. Serial troponins were negative x3. The patient remained hemodynamically stable.    Consultants: none  Discharge Exam: Filed Vitals:   01/07/13 0445  BP: 105/65  Pulse: 84  Temp: 97.7 F (36.5 C)  Resp: 18   Filed Vitals:   01/06/13 1830 01/06/13 1847 01/06/13 2053 01/07/13 0445  BP:  116/75 117/72 105/65  Pulse: 88 88 82 84  Temp:   97.5 F (36.4  C) 97.7 F (36.5 C)  TempSrc:   Oral Oral  Resp: 19 17 18 18   Height:   5' 3.5" (1.613 m)   Weight:   108.7 kg (239 lb 10.2 oz)   SpO2: 97% 97% 99% 98%   General: A&O x 3, NAD, pleasant, cooperative Cardiovascular: RRR, no rub, no gallop, no S3; chest wall without any erythema, edema.  Tender with light palpation left pectoral  area. Respiratory: CTAB, no wheeze, no rhonchi Abdomen:soft, nontender, nondistended, positive bowel sounds Extremities: No edema, No lymphangitis, no petechiae; left shoulder and arm without any edema, erythema, cellulitis  Discharge Instructions      Discharge Orders   Future Appointments Provider Department Dept Phone   01/12/2013 9:30 AM [facility]  IMAGING [phone]   Patient should wear two piece clothing and wear no powder or deodorant. Patient should arrive 15 minutes early.   07/09/2013 2:00 PM Windell [facility] MEDICAL ONCOLOGY [phone]   07/16/2013 2:15 PM Amy Allegra Grana, PA-C [facility] MEDICAL ONCOLOGY [phone]   Future Orders Complete By Expires   Diet - low sodium heart healthy  As directed    Increase activity slowly  As directed        Medication List         ALIGN PO  Take 1 capsule by mouth at bedtime.     ALPRAZolam 1 MG tablet  Commonly known as:  XANAX  Take 1 mg by mouth 3 (three) times daily as needed. anxiety     butalbital-aspirin-caffeine 50-325-40 MG per capsule  Commonly known as:  FIORINAL  Take 1 capsule by mouth 3 (three) times daily as needed. For headache     cyclobenzaprine 10 MG tablet  Commonly known as:  FLEXERIL  Take 10 mg by mouth 3 (three) times daily as needed for muscle spasms.     diazepam 5 MG tablet  Commonly known as:  VALIUM  Take 1 tablet (5 mg total) by mouth every 8 (eight) hours as needed (muscle spasm).     DULoxetine 20 MG capsule  Commonly known as:  CYMBALTA  Take 1 capsule (20 mg total) by mouth daily.     esomeprazole 40 MG capsule  Commonly known as:  NEXIUM  Take 40 mg by mouth daily before breakfast.     fish oil-omega-3 fatty acids 1000 MG capsule  Take 1 capsule by mouth daily with breakfast.     hydrochlorothiazide 25 MG tablet  Commonly known as:  HYDRODIURIL  Take 25 mg by mouth at bedtime.     ibuprofen 800 MG  tablet  Commonly known as:  ADVIL,MOTRIN  Take 800 mg by mouth every 8 (eight) hours as needed. For pain     lisinopril 10 MG tablet  Commonly known as:  PRINIVIL,ZESTRIL  Take 10 mg by mouth daily.     magnesium oxide 400 MG tablet  Commonly known as:  MAG-OX  Take 400 mg by mouth daily with breakfast.     multivitamin tablet  Take 1 tablet by mouth daily with breakfast.     oxyCODONE-acetaminophen 5-325 MG per tablet  Commonly known as:  PERCOCET/ROXICET  Take 1 tablet by mouth every 4 (four) hours as needed.     pregabalin 75 MG capsule  Commonly known as:  LYRICA  Take 1 capsule (75 mg total) by mouth 2 (two) times daily.     senna 8.6 MG tablet  Commonly  known as:  SENOKOT  Take 1 tablet by mouth every other day.     tamoxifen 10 MG tablet  Commonly known as:  NOLVADEX  Take 10 mg by mouth daily.         The results of significant diagnostics from this hospitalization (including imaging, microbiology, ancillary and laboratory) are listed below for reference.    Significant Diagnostic Studies: Ct Angio Chest Pe W/cm &/or Wo Cm  01/06/2013   *RADIOLOGY REPORT*  Clinical Data: Short of breath, chest pain, history of breast cancer  CT ANGIOGRAPHY CHEST  Technique:  Multidetector CT imaging of the chest using the standard protocol during bolus administration of intravenous contrast. Multiplanar reconstructed images including MIPs were obtained and reviewed to evaluate the vascular anatomy.  Contrast: OMNIPAQUE IOHEXOL 350 MG/ML SOLN  Comparison: Chest x-ray obtained earlier today at 1509 hours  Findings:  Mediastinum: Unremarkable CT appearance of the thyroid gland.  No suspicious mediastinal or hilar adenopathy.  No soft tissue mediastinal mass.  The thoracic esophagus is unremarkable.  Heart/Vascular: Limited evaluation of the pulmonary arteries secondary to beam hardening artifact related to patient body habitus and diffuse respiratory motion.  There is adequate  opacification of the main and central pulmonary arteries to the proximal lobe are level.  Evaluation of the segmental and subsegmental pulmonary arteries is significantly limited.  No central filling defect to suggest clinically significant pulmonary embolus. There is a bovine configuration of the aortic arch (two vessel arch with common origin of the brachiocephalic and left common carotid arteries), a normal anatomic variant.  No aneurysmal dilatation.  Decreased AP diameter of the chest results and elongation of the heart.  The heart itself is likely within normal limits although it is at the upper limits.  Atherosclerotic vascular calcification noted within the coronary arteries.  No pericardial effusion.  Lungs/Pleura: No pleural effusion.  Respiratory motion limits evaluation for small pulmonary nodules. Scattered pulmonary nodules ranging in size from two - 4 mm in the right upper lobe and minor fissure.  Atelectasis noted in the lingula, right middle lobe and bilateral lower lobes.  Upper Abdomen: Limited evaluation of the upper abdomen demonstrates no acute abnormality.  Bones: No acute fracture or aggressive appearing lytic or blastic osseous lesion.  IMPRESSION:  1.  No clinically significant central pulmonary embolus to the lobar level.  Evaluation of the more distal pulmonary arteries limited by multiple factors as detailed above.  2.  Atherosclerosis including coronary artery disease. 3.  Low inspiratory volumes with areas of subsegmental atelectasis in the right middle lobe, lingula and bilateral lower lobes. 4.  Heart is at the upper limits of normal for size. 5.  Congenitally small AP diameter of the chest.   Original Report Authenticated By: Malachy Moan, M.D.   Dg Chest Port 1 View  01/06/2013   *RADIOLOGY REPORT*  Clinical Data: Chest pain.  Hypertension.  PORTABLE CHEST - 1 VIEW  Comparison: 03/01/2012.  Findings: Low volume chest with bilateral basilar atelectasis.  Low volumes  accentuate the cardiopericardial silhouette. Monitoring leads are projected over the chest.  No airspace disease or pleural effusion identified.  IMPRESSION: Low volume chest.   Original Report Authenticated By: Andreas Newport, M.D.     Microbiology: No results found for this or any previous visit (from the past 240 hour(s)).   Labs: Basic Metabolic Panel:  Recent Labs Lab 01/06/13 1449 01/06/13 1835  NA 136 137  K 3.9 4.0  CL 101 103  CO2 24 27  GLUCOSE 99 96  BUN 13 12  CREATININE 0.59 0.66  CALCIUM 9.5 8.4   Liver Function Tests:  Recent Labs Lab 01/06/13 1835  AST 16  ALT 20  ALKPHOS 94  BILITOT 0.1*  PROT 6.5  ALBUMIN 3.1*   No results found for this basename: LIPASE, AMYLASE,  in the last 168 hours No results found for this basename: AMMONIA,  in the last 168 hours CBC:  Recent Labs Lab 01/06/13 1449  WBC 10.0  HGB 13.5  HCT 41.7  MCV 86.0  PLT 301   Cardiac Enzymes:  Recent Labs Lab 01/06/13 1835 01/07/13 0003 01/07/13 0620  TROPONINI <0.30 <0.30 <0.30   BNP: No components found with this basename: POCBNP,  CBG: No results found for this basename: GLUCAP,  in the last 168 hours  Time coordinating discharge:  Greater than 30 minutes  Signed:  Lajuanna Pompa, DO [facility] Pager: [phone] 01/07/2013, 11:01 AM

## 2013-01-08 ENCOUNTER — Telehealth: Payer: Self-pay | Admitting: *Deleted

## 2013-01-08 NOTE — Telephone Encounter (Signed)
Pt called to schedule an appt. gv appt for 01/19/13 @ 2pm.. Pt is aware...td

## 2013-01-19 ENCOUNTER — Ambulatory Visit: Payer: Medicare Other | Admitting: Family

## 2013-01-19 ENCOUNTER — Encounter: Payer: Self-pay | Admitting: Family

## 2013-01-20 ENCOUNTER — Other Ambulatory Visit (INDEPENDENT_AMBULATORY_CARE_PROVIDER_SITE_OTHER): Payer: Self-pay | Admitting: *Deleted

## 2013-01-20 DIAGNOSIS — C50919 Malignant neoplasm of unspecified site of unspecified female breast: Secondary | ICD-10-CM

## 2013-01-20 MED ORDER — UNABLE TO FIND: Status: DC

## 2013-01-21 ENCOUNTER — Telehealth: Payer: Self-pay | Admitting: *Deleted

## 2013-01-21 NOTE — Telephone Encounter (Signed)
pt called requesting an appt w/ GCM. gv appt d/t for 02/17/13@ 11:30am. pt is aware...td

## 2013-01-22 ENCOUNTER — Ambulatory Visit
Admission: RE | Admit: 2013-01-22 | Discharge: 2013-01-22 | Disposition: A | Discharge status: Home / Self Care | Payer: Medicare Other | Source: Ambulatory Visit | Attending: Oncology | Admitting: Oncology

## 2013-01-22 ENCOUNTER — Telehealth: Payer: Self-pay | Admitting: *Deleted

## 2013-01-22 DIAGNOSIS — Z853 Personal history of malignant neoplasm of breast: Secondary | ICD-10-CM

## 2013-02-17 ENCOUNTER — Other Ambulatory Visit: Payer: Self-pay | Admitting: *Deleted

## 2013-02-17 ENCOUNTER — Ambulatory Visit: Payer: Medicare Other | Admitting: Oncology

## 2013-02-17 NOTE — Progress Notes (Signed)
Message left by pt stating " I am calling to speak with the nurse about scheduling an MRI - which is what the doctor said I needed "  Return call number [phone].  Noted pt was scheduled for appointment today- based on her call earlier this month requesting an appointment with MD.  Pt did obtain mammogram 01/22/2013.  This note will be given to MD for appropriate recommendations and call to be returned to pt.

## 2013-02-19 ENCOUNTER — Ambulatory Visit: Payer: Self-pay | Admitting: Obstetrics & Gynecology

## 2013-02-20 ENCOUNTER — Telehealth: Payer: Self-pay | Admitting: Oncology

## 2013-02-20 NOTE — Telephone Encounter (Signed)
Sent letter to patient from Dr. [name]

## 2013-02-26 ENCOUNTER — Telehealth: Payer: Self-pay | Admitting: *Deleted

## 2013-02-26 NOTE — Telephone Encounter (Signed)
Pt called to r/s her fkta. gv appt for 03/03/13 @ 4pm. Pt is aware...td

## 2013-03-03 ENCOUNTER — Encounter: Payer: Self-pay | Admitting: Oncology

## 2013-03-03 ENCOUNTER — Ambulatory Visit: Payer: Medicare Other | Admitting: Oncology

## 2013-03-03 ENCOUNTER — Other Ambulatory Visit: Payer: Self-pay | Admitting: Oncology

## 2013-03-04 ENCOUNTER — Telehealth: Payer: Self-pay | Admitting: *Deleted

## 2013-03-04 NOTE — Telephone Encounter (Signed)
Pt called to rs her ftka from yesterday so stated that she ws so sick. gv appt for 03/06/13 @ 2:15pm. Pt is aware...td

## 2013-03-06 ENCOUNTER — Telehealth: Payer: Self-pay | Admitting: *Deleted

## 2013-03-06 ENCOUNTER — Ambulatory Visit (HOSPITAL_BASED_OUTPATIENT_CLINIC_OR_DEPARTMENT_OTHER): Payer: Medicare Other | Admitting: Physician Assistant

## 2013-03-06 ENCOUNTER — Encounter (INDEPENDENT_AMBULATORY_CARE_PROVIDER_SITE_OTHER): Payer: Self-pay

## 2013-03-06 ENCOUNTER — Ambulatory Visit (HOSPITAL_BASED_OUTPATIENT_CLINIC_OR_DEPARTMENT_OTHER): Payer: Medicare Other | Admitting: Lab

## 2013-03-06 ENCOUNTER — Encounter: Payer: Self-pay | Admitting: Physician Assistant

## 2013-03-06 VITALS — BP 125/88 | HR 102 | Temp 98.2°F | Resp 20 | Ht 63.5 in | Wt 244.0 lb

## 2013-03-06 DIAGNOSIS — R922 Inconclusive mammogram: Secondary | ICD-10-CM

## 2013-03-06 DIAGNOSIS — Z853 Personal history of malignant neoplasm of breast: Secondary | ICD-10-CM

## 2013-03-06 DIAGNOSIS — M545 Low back pain, unspecified: Secondary | ICD-10-CM | POA: Insufficient documentation

## 2013-03-06 DIAGNOSIS — IMO0001 Reserved for inherently not codable concepts without codable children: Secondary | ICD-10-CM

## 2013-03-06 DIAGNOSIS — C50419 Malignant neoplasm of upper-outer quadrant of unspecified female breast: Secondary | ICD-10-CM

## 2013-03-06 DIAGNOSIS — C50919 Malignant neoplasm of unspecified site of unspecified female breast: Secondary | ICD-10-CM

## 2013-03-06 DIAGNOSIS — R9230 Dense breasts, unspecified: Secondary | ICD-10-CM | POA: Insufficient documentation

## 2013-03-06 DIAGNOSIS — I890 Lymphedema, not elsewhere classified: Secondary | ICD-10-CM

## 2013-03-06 DIAGNOSIS — N644 Mastodynia: Secondary | ICD-10-CM | POA: Insufficient documentation

## 2013-03-06 DIAGNOSIS — M797 Fibromyalgia: Secondary | ICD-10-CM | POA: Insufficient documentation

## 2013-03-06 HISTORY — DX: Fibromyalgia: M79.7

## 2013-03-06 LAB — COMPREHENSIVE METABOLIC PANEL (CC13)
ALT: 21 U/L (ref 0–55)
AST: 20 U/L (ref 5–34)
Albumin: 3.6 g/dL (ref 3.5–5.0)
Alkaline Phosphatase: 104 U/L (ref 40–150)
Anion Gap: 11 mEq/L (ref 3–11)
BUN: 12.9 mg/dL (ref 7.0–26.0)
CO2: 25 mEq/L (ref 22–29)
Calcium: 9.4 mg/dL (ref 8.4–10.4)
Chloride: 106 mEq/L (ref 98–109)
Creatinine: 0.7 mg/dL (ref 0.6–1.1)
Glucose: 89 mg/dl (ref 70–140)
Potassium: 3.9 mEq/L (ref 3.5–5.1)
Sodium: 142 mEq/L (ref 136–145)
Total Bilirubin: <0.2 mg/dL (ref 0.20–1.20)
Total Protein: 7.3 g/dL (ref 6.4–8.3)

## 2013-03-06 LAB — CBC WITH DIFFERENTIAL/PLATELET
BASO%: 0.4 % (ref 0.0–2.0)
Basophils Absolute: 0 10*3/uL (ref 0.0–0.1)
EOS%: 1.1 % (ref 0.0–7.0)
Eosinophils Absolute: 0.1 10*3/uL (ref 0.0–0.5)
HCT: 38.1 % (ref 34.8–46.6)
HGB: 12.2 g/dL (ref 11.6–15.9)
LYMPH%: 31.4 % (ref 14.0–49.7)
MCH: 27.4 pg (ref 25.1–34.0)
MCHC: 32.1 g/dL (ref 31.5–36.0)
MCV: 85.5 fL (ref 79.5–101.0)
MONO#: 0.5 10*3/uL (ref 0.1–0.9)
MONO%: 6.1 % (ref 0.0–14.0)
NEUT#: 5.1 10*3/uL (ref 1.5–6.5)
NEUT%: 61 % (ref 38.4–76.8)
Platelets: 262 10*3/uL (ref 145–400)
RBC: 4.45 10*6/uL (ref 3.70–5.45)
RDW: 14 % (ref 11.2–14.5)
WBC: 8.4 10*3/uL (ref 3.9–10.3)
lymph#: 2.6 10*3/uL (ref 0.9–3.3)

## 2013-03-06 MED ORDER — TRAMADOL HCL 50 MG PO TABS: 50.0000 mg | ORAL_TABLET | Freq: Four times a day (QID) | ORAL | Status: DC | PRN

## 2013-03-06 NOTE — Progress Notes (Signed)
ID: Ashley Lindsey   DOB: June 13, 1960  MR#: [id number]  WUJ#:[id number]  PCP: Alva Garnet., MD GYN: Antionette Char SU: Claud Kelp OTHER MD: Antony Blackbird  CHIEF COMPLAINT:  Bilateral Breast Cancers   HISTORY OF PRESENT ILLNESS: From Dr. Doreatha Massed 07/09/2007 intake total  "This woman has been in good health.  She undergoes annual screening mammography.  She had a screening mammogram in November, 2008 that showed calcifications in the left breast.  Additional views were recommended, which took place on 05/02/2007.  This showed a suspicious cluster of microcalcifications upper outer quadrant of left breast.  Stereotactic biopsy was recommended.  Biopsy on 05/27/2007 showed DCIS with calcifications, ER/PR positive at 100% respectively.  A preoperative MRI scan performed on 06/04/2007 showed a 1.7 x 1.4 x 0.9 cm DCIS at 12 o'clock position left breast.  No other abnormalities were seen.  Ms. Wamble underwent a lumpectomy on 06/20/2007.  Final pathology case number [id number] showed a 0.4-0.5 cm focus of invasive ductal cancer grade 1 of 3 with tubular features, clear surgical margins.  Lymphovascular invasion was not seen.  Associated DCIS measuring 0.4 cm was seen.  In addition, the tumor was ER/PR positive at 77% and 96% respectively, proliferative index less than 5%, HER-2 was 0.  She has had an unremarkable postoperative course"  Her subsequent history is as detailed below.  INTERVAL HISTORY: Ashley Lindsey returns alone today for followup of her bilateral breast cancers. She's currently being followed with observation alone.   [name] have multiple concerns to discuss today. Her primary complaint continues to be diffuse pain which she describes as severe. It is limiting her day-to-day activity, and decreasing her functional status. In fact her daughter often helps her to rest. She has been diagnosed with fibromyalgia since her breast cancer diagnosis. Her primary care physician, Dr. Renae Gloss, has  scheduled her to see a rheumatologist. Ashley Lindsey is not sure of the rheumatologist's name, but knows that her appointment is "soon".  In the meanwhile, she is on Lyrica, 75 mg twice daily. She was previously on Cymbalta which she tells me she did not tolerate.    She tells me it "hurts all over" although the most significant pain is currently in the lower back, and also bilaterally in the breasts, although the left is worse than the right. She denies any known injuries to her back, and to her knowledge has never had a spinal MRI for evaluation. She tells me the pain radiates around the hips, and often down the buttocks into the legs. This makes it difficult for her to walk due to the pain.  She is status post recent mammogram on 01/22/2013 which was unremarkable with the exception of dense breast tissue. She tells me the pain in her breast is sharp and persistent, and the breasts are extremely tender to touch bilaterally.  She also has some intermittent swelling in her left upper extremity associated with lymphedema. She has been evaluated in the past at the lymphedema clinic she tells me, and Dr. Darnelle Catalan had previously suggested that she get a compression sleeve.   REVIEW OF SYSTEMS: Ashley Lindsey has had no recent illnesses and denies any fevers or chills. She does have hot flashes. She denies any skin changes or rashes. She's had no abnormal bleeding. Her energy level is very low. Her appetite is reduced, although she denies any nausea or emesis. She tends to have some mild constipation for which she takes stool softeners regularly. She has shortness of breath with exertion, which is  stable. She denies any increased cough or phlegm production and has had no chest pain or palpitations. She's had no abnormal headaches and denies any dizziness. She has occasional swelling in the feet and ankles, which improves with elevation.   A detailed review of systems was otherwise noncontributory  PAST MEDICAL HISTORY: Past  Medical History  Diagnosis Date  . Abdominal cramps   . Muscle spasms of head and/or neck     following to the breast  . Fatigue   . Passed out     4th of july  . Migraines   . Chronic headaches   . Arthritis   . Cancer     s/p radiation- last dose 6/12//has been off Tamoxifen 8/12  . Sleep apnea     STOP BANG SCORE 5  . Lymphedema     RIGHT ARM-////  STATES USE LEFT ARM FOR BP'S  . S/P radiation therapy 08/19/07 - 10/10/07    Left Breast/5040 cGy/28 fractions with Boost for a toatl dose of 6300 dGy  . S/P radiation therapy 08/14/10 -10/03/10    right breast  . Costochondritis   . Fibromyalgia 03/06/2013    PAST SURGICAL HISTORY: Past Surgical History  Procedure Laterality Date  . Breast lumpectomy  2009/2012    LEFT/RIGHT with lymph node dissection  . Abdominal hysterectomy      with left salpingooophorectomy  . Knee arthroscopy      right  . Oophorectomy  2013    FAMILY HISTORY Family History  Problem Relation Age of Onset  . Breast cancer Mother 63  . Heart disease Father   . Prostate cancer Maternal Uncle 78  . Cancer Maternal Aunt 63    d. from "female cancer" possibly cervical  . Prostate cancer Paternal Uncle   . Prostate cancer Maternal Grandfather 13  . Prostate cancer Maternal Uncle 80  . Prostate cancer Paternal Uncle   . Breast cancer Cousin 22    mat 1st cousin   the patient's father died from a heart attack at age 57. The patient's mother is currently 24. She was diagnosed with breast cancer at age 19. The patient's mother had one sister, diagnosed with cervical cancer at age 22. The patient's mother had two out of three brothers died from cancer, the patient believes prostate. Ashley Lindsey herself had 2 brothers and one sister. Non-of them have had cancer. There is no other history of breast cancer and no history of ovarian cancer in the family.  GYNECOLOGIC HISTORY:  (Updated November 2014)  Menarche age 23, first live birth age 36, she is [place] P1. 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 The patient shares a home with her God sister, Sherryll Burger. The patient's daughter, Myriam Forehand. Juleen China, is a Herbalist. The patient has no grandchildren. She attends a [employer]   ADVANCED DIRECTIVES: Not in place  HEALTH MAINTENANCE:  (updated November 2014)  History  Substance Use Topics  . Smoking status: Current Every Day Smoker -- 0.25 packs/day for 1 years    Types: Cigarettes  . Smokeless tobacco: Never Used  . Alcohol Use: Yes     Comment: 3 x a week     Colonoscopy: Not on file  PAP: Status post remote hysterectomy   Bone density:  December 2012, normal   Lipid panel: Dr. Karlton Lemon  Allergies  Allergen Reactions  . Aspirin     unknown  . Penicillins Hives and Swelling    Current Outpatient Prescriptions  Medication Sig Dispense Refill  . ALPRAZolam (XANAX) 1 MG tablet Take 1 mg by mouth 3 (three) times daily as needed. anxiety      . butalbital-aspirin-caffeine (FIORINAL) 50-325-40 MG per capsule Take 1 capsule by mouth 3 (three) times daily as needed. For headache      . cholecalciferol (VITAMIN D) 1000 UNITS tablet Take 2,000 Units by mouth 2 (two) times daily.      . cyclobenzaprine (FLEXERIL) 10 MG tablet Take 10 mg by mouth 3 (three) times daily as needed for muscle spasms.       Marland Kitchen esomeprazole (NEXIUM) 40 MG capsule Take 40 mg by mouth daily before breakfast.      . fish oil-omega-3 fatty acids 1000 MG capsule Take 1 capsule by mouth daily with breakfast.       . hydrochlorothiazide (HYDRODIURIL) 25 MG tablet Take 25 mg by mouth at bedtime.       Marland Kitchen ibuprofen (ADVIL,MOTRIN) 800 MG tablet Take 800 mg by mouth every 8 (eight) hours as needed. For pain      . lisinopril (PRINIVIL,ZESTRIL) 10 MG tablet Take 10 mg by mouth daily.      .  pregabalin (LYRICA) 75 MG capsule Take 1 capsule (75 mg total) by mouth 2 (two) times daily.  60 capsule  1  . senna (SENOKOT) 8.6 MG tablet Take 1 tablet by mouth every other day.       . traMADol (ULTRAM) 50 MG tablet Take 1 tablet (50 mg total) by mouth every 6 (six) hours as needed (pain).  60 tablet  0  . UNABLE TO FIND Rx: L8000-Post Surgical Bra (Quantity: 6) Z6109- Silicone Breast Prosthesis (Quantity: 2) Dx: 174.9; Bilateral partial mastectomy Replacement due to hot flashes since previous items were too heavy and hot  1 each  0   No current facility-administered medications for this visit.    OBJECTIVE: Middle-aged [place] American woman who appears uncomfortable  Filed Vitals:   03/06/13 1413  BP: 125/88  Pulse: 102  Temp: 98.2 F (36.8 C)  Resp: 20     Body mass index is 42.54 kg/(m^2).    ECOG FS: 2 Filed Weights   03/06/13 1413  Weight: 244 lb (110.678 kg)   Physical Exam: HEENT:  Sclerae anicteric.  Oropharynx clear. Buccal mucosa is pink and moist. NODES:  No cervical or supraclavicular lymphadenopathy palpated.  BREAST EXAM:  It was difficult to perform a thorough breast or axillary exam secondary to the patient's pain with even gentle palpation. Both breasts are status post lumpectomy, and there was no obvious evidence of disease recurrence. Breast tissue is dense bilaterally. I was unable to palpate any axillary lymphadenopathy on either the right or the left. LUNGS:  Clear to auscultation bilaterally.  No wheezes or rhonchi HEART:  Regular rate and rhythm. No murmur appreciated. ABDOMEN:  Soft, obese, nontender.  Positive bowel sounds.  MSK:  Tenderness to palpation of the lumbar sacral spine. Also tenderness with palpation of the hips bilaterally. Decreased range of motion in both the upper and lower extremities secondary to pain. EXTREMITIES: Mild lymphedema in the left upper extremity, nonpitting. No additional peripheral edema noted today. NEURO:  Nonfocal.  Well oriented.  Anxious affect.    LAB RESULTS: Lab Results  Component Value Date   WBC 10.0 01/06/2013   NEUTROABS 6.5 12/26/2011  HGB 13.5 01/06/2013   HCT 41.7 01/06/2013   MCV 86.0 01/06/2013   PLT 301 01/06/2013      Chemistry      Component Value Date/Time   NA 137 01/06/2013 1835   NA 139 12/26/2011 1538   K 4.0 01/06/2013 1835   K 4.4 12/26/2011 1538   CL 103 01/06/2013 1835   CL 105 12/26/2011 1538   CO2 27 01/06/2013 1835   CO2 25 12/26/2011 1538   BUN 12 01/06/2013 1835   BUN 12.0 12/26/2011 1538   CREATININE 0.66 01/06/2013 1835   CREATININE 0.7 12/26/2011 1538      Component Value Date/Time   CALCIUM 8.4 01/06/2013 1835   CALCIUM 9.3 12/26/2011 1538   ALKPHOS 94 01/06/2013 1835   ALKPHOS 106 12/26/2011 1538   AST 16 01/06/2013 1835   AST 16 12/26/2011 1538   ALT 20 01/06/2013 1835   ALT 22 12/26/2011 1538   BILITOT 0.1* 01/06/2013 1835   BILITOT <0.20 Repeated and Verified 12/26/2011 1538       STUDIES: Bone density 03/23/2011 was normal.   Bilateral mammogram on 01/22/2013 was unremarkable, although it did confirm bilateral heterogeneously dense breast tissue.     ASSESSMENT: 52 y.o. [place] woman  (1) status post left lumpectomy 06/20/2007 for a pT1a invasive ductal carcinoma, grade 1, estrogen receptor 77%, progesterone receptor and 96%, with an MIB-1 of less than 5%, and no HER-2 amplification  (2) left sentinel lymph node biopsy (report erroneously states "right") 07/21/2007, was pN0(i+)  (3) left breast adjuvant radiation completed 06/19/200  (4) tamoxifen started September 2009, discontinued March 2012  (5) status post right lumpectomy 05/26/2010 for ductal carcinoma in situ, with margins cleared 08/11/2010.  The closest margin (deep) was 1.8 cm. The HER-2 repeat was performed on the smaller nodule on the skin.   (6) status post right breast adjuvant radiation completed 10/03/2010  (7)  currently followed with observation alone   (8)  intermittent left upper  extremity lymphedema  (9)  postsurgical pain bilaterally in the breasts  (10)  heterogeneously dense breast tissue bilaterally  (11) diffuse pain associated with fibromyalgia  (12)   recent onset of lower back pain   (13)  genetic testing in May 2014, showed patient to be  heterozygous for a variant of unknown significance in BRCA1.   PLAN:  Over half of our 50 minute appointment today was spent discussing the patient's concerns, answering her questions, and coordinating care.   With regards to her breast cancer diagnosis, Karimah is actually doing extremely well. According to Dr. Darnelle Catalan prior notes, she has a great prognosis and a very low risk of recurrence. He is still comfortable following her with observation alone, with no additional anti-estrogen therapy.  The breast pain she is experiencing is likely associated with postsurgical pain. Although she recently had a mammogram, she does have dense breast tissue, I am going to order a breast MRI to be done for further evaluation.  She'll continue on the Lyrica. I am also adding tramadol, 50 mg up to 4 times daily to see if this helps with the pain.  We will also obtain an MRI of the lumbosacral spine for further evaluation of the recent onset of lower back pain. I do not think this is going to be associated with her breast cancer diagnosis, and we will refer her to either orthopedics or neurology if necessary, depending on those scan results.  I am also referring her back to the lymphedema clinic  for further evaluation of intermittent swelling, pain in the arm and left axilla, and measurement for compression sleeve.  She'll continue to followup regularly with Dr. Renae Gloss, and will also keep her upcoming appointment with the rheumatologist.  She will be due to return here in February or March of next year for labs and physical exam. Demarie tells me that she would really prefer a female physician, and accordingly would like to transfer to Dr.  Milta Deiters service. I will place this request, and she would like to meet with Dr. Welton Flakes she returns for her next visit.  She was given all of the above information in writing today, and voices her understanding and agreement with this plan. She will call with any changes or problems prior to her next appointment.  Odean Fester PA-C     03/06/2013

## 2013-03-06 NOTE — Telephone Encounter (Signed)
appts made and printed. Lm for Harriett Sine to call me so that the pt can be scheduled for lymphedema clinic. Pt is aware...td

## 2013-03-09 ENCOUNTER — Telehealth: Payer: Self-pay | Admitting: *Deleted

## 2013-03-09 NOTE — Telephone Encounter (Signed)
Called pt to get her scheduled w/ Dr. Welton Flakes per Amy B.  Pt had a lot of questions about this before she switched.  I told pt that I would ask Amy and call her back.  I emailed Amy to address those questions.

## 2013-03-10 ENCOUNTER — Telehealth: Payer: Self-pay | Admitting: *Deleted

## 2013-03-10 NOTE — Telephone Encounter (Signed)
Received answers back from Amy B. And I called the patient to go over them with her.  She would like to leave all visits as they are until she does her MRI and sees how that comes back.  When Amy B. Calls her with the MRI results in Dec depending on what they are, she will then decided to come f/u earlier with Amy or go ahead and see Dr. Welton Flakes.  Emailed Amy B to make her aware.

## 2013-03-11 ENCOUNTER — Ambulatory Visit: Payer: Medicare Other | Attending: Physician Assistant | Admitting: Physical Therapy

## 2013-03-23 ENCOUNTER — Inpatient Hospital Stay: Admission: RE | Admit: 2013-03-23 | Payer: Medicare Other | Source: Ambulatory Visit

## 2013-03-31 ENCOUNTER — Telehealth: Payer: Self-pay | Admitting: *Deleted

## 2013-04-02 ENCOUNTER — Other Ambulatory Visit: Payer: Medicare Other

## 2013-04-14 ENCOUNTER — Inpatient Hospital Stay: Admission: RE | Admit: 2013-04-14 | Payer: Medicare Other | Source: Ambulatory Visit

## 2013-04-14 ENCOUNTER — Other Ambulatory Visit: Payer: Medicare Other

## 2013-04-21 ENCOUNTER — Telehealth: Payer: Self-pay | Admitting: *Deleted

## 2013-04-21 NOTE — Telephone Encounter (Signed)
Returned pt's call. Complaints of lower back pain. Pt states," I'm taking my Flexeril and Tylenol. Tylenol is not helping. I need to get some direction what should I do". No answer when I called back, but I left a message and suggested that if her back pain was unrelieved, she may want to go to UrgentCare or the ED. Told her to call me back if she had any questions or concerns at [phone].

## 2013-04-24 ENCOUNTER — Telehealth: Payer: Self-pay | Admitting: *Deleted

## 2013-04-24 ENCOUNTER — Inpatient Hospital Stay: Admission: RE | Admit: 2013-04-24 | Payer: Medicare Other | Source: Ambulatory Visit

## 2013-04-24 ENCOUNTER — Other Ambulatory Visit: Payer: Medicare Other

## 2013-04-24 NOTE — Telephone Encounter (Signed)
Pt left message requesting a return call due to pain in back radiating from her breast.  This RN returned call to pt - per call noted MRI of back and breast scheduled - dates and times reviewed. Pt stated " I will take the tramadol until then "  No other needs at this time.

## 2013-04-27 ENCOUNTER — Ambulatory Visit
Admission: RE | Admit: 2013-04-27 | Discharge: 2013-04-27 | Disposition: A | Discharge status: Home / Self Care | Payer: Commercial Managed Care - HMO | Source: Ambulatory Visit | Attending: Physician Assistant | Admitting: Physician Assistant

## 2013-04-27 ENCOUNTER — Other Ambulatory Visit: Payer: Medicare Other

## 2013-04-27 DIAGNOSIS — M545 Low back pain, unspecified: Secondary | ICD-10-CM

## 2013-04-27 DIAGNOSIS — Z853 Personal history of malignant neoplasm of breast: Secondary | ICD-10-CM

## 2013-04-27 MED ORDER — GADOBENATE DIMEGLUMINE 529 MG/ML IV SOLN
20.0000 mL | Freq: Once | INTRAVENOUS | Status: AC | PRN
  Administered 2013-04-27: 20 mL via INTRAVENOUS

## 2013-04-28 ENCOUNTER — Telehealth: Payer: Self-pay | Admitting: *Deleted

## 2013-04-28 ENCOUNTER — Other Ambulatory Visit: Payer: Self-pay | Admitting: Physician Assistant

## 2013-04-28 ENCOUNTER — Other Ambulatory Visit: Payer: Medicare Other

## 2013-04-28 ENCOUNTER — Other Ambulatory Visit: Payer: Self-pay | Admitting: *Deleted

## 2013-04-28 DIAGNOSIS — M797 Fibromyalgia: Secondary | ICD-10-CM

## 2013-04-28 DIAGNOSIS — Z853 Personal history of malignant neoplasm of breast: Secondary | ICD-10-CM

## 2013-04-28 MED ORDER — TRAMADOL HCL 50 MG PO TABS: 50.0000 mg | ORAL_TABLET | Freq: Four times a day (QID) | ORAL | Status: DC | PRN

## 2013-04-28 NOTE — Telephone Encounter (Signed)
Patient called requesting refill on Tramadol due to "sharpe, achy, toothache, charlie horse sensation in my right hip and legs.  I can't even use the bathroom and wipe due to this pain and inability to walk".  I would like a sooner appointment in February not March with APP.  Reports history of Fibromyalgia and is now using a borrowed cane.  When in stores rides in the electronic wheelchair carts "Because I can't walk".  Reports swelling to both feet and thighs.  "I press on thighs and an indentation is left.  Dr. Truddie Lindsey d/c'd tamoxifen after three years and placed me on Lyrica.  "My other doctors say I need to be on tamoxifen but it has not been mentioned or addressed since Dr. Truddie Lindsey left.  I see my primary and am told to contact Dr. Jana Lindsey.  I need help."    Ashley Lindsey   DOB: 03/08/1961   MR#: [id number]   ZYS#:[id number] 04/28/2013,  Medical History:  Past Medical History  Diagnosis Date  . Abdominal cramps   . Muscle spasms of head and/or neck     following to the breast  . Fatigue   . Passed out     4th of july  . Migraines   . Chronic headaches   . Arthritis   . Cancer     s/p radiation- last dose 6/12//has been off Tamoxifen 8/12  . Sleep apnea     STOP BANG SCORE 5  . Lymphedema     RIGHT ARM-////  STATES USE LEFT ARM FOR BP'S  . S/P radiation therapy 08/19/07 - 10/10/07    Left Breast/5040 cGy/28 fractions with Boost for a toatl dose of 6300 dGy  . S/P radiation therapy 08/14/10 -10/03/10    right breast  . Costochondritis   . Fibromyalgia 03/06/2013    chemotherapy   Surgical history:  Past Surgical History  Procedure Laterality Date  . Breast lumpectomy  2009/2012    LEFT/RIGHT with lymph node dissection  . Abdominal hysterectomy      with left salpingooophorectomy  . Knee arthroscopy      right  . Oophorectomy  2013    Allergies: is allergic to aspirin and penicillins.   Meds: Current Outpatient Prescriptions  Medication Sig Dispense Refill  . ALPRAZolam  (XANAX) 1 MG tablet Take 1 mg by mouth 3 (three) times daily as needed. anxiety      . butalbital-aspirin-caffeine (FIORINAL) 50-325-40 MG per capsule Take 1 capsule by mouth 3 (three) times daily as needed. For headache      . cholecalciferol (VITAMIN D) 1000 UNITS tablet Take 2,000 Units by mouth 2 (two) times daily.      . cyclobenzaprine (FLEXERIL) 10 MG tablet Take 10 mg by mouth 3 (three) times daily as needed for muscle spasms.       Marland Kitchen esomeprazole (NEXIUM) 40 MG capsule Take 40 mg by mouth daily before breakfast.      . fish oil-omega-3 fatty acids 1000 MG capsule Take 1 capsule by mouth daily with breakfast.       . hydrochlorothiazide (HYDRODIURIL) 25 MG tablet Take 25 mg by mouth at bedtime.       Marland Kitchen ibuprofen (ADVIL,MOTRIN) 800 MG tablet Take 800 mg by mouth every 8 (eight) hours as needed. For pain      . lisinopril (PRINIVIL,ZESTRIL) 10 MG tablet Take 10 mg by mouth daily.      . pregabalin (LYRICA) 75 MG capsule Take 1 capsule (75 mg total) by  mouth 2 (two) times daily.  60 capsule  1  . senna (SENOKOT) 8.6 MG tablet Take 1 tablet by mouth every other day.       . traMADol (ULTRAM) 50 MG tablet Take 1 tablet (50 mg total) by mouth every 6 (six) hours as needed (pain).  60 tablet  0  . UNABLE TO FIND Rx: L8000-Post Surgical Bra (Quantity: 6) N0272- Silicone Breast Prosthesis (Quantity: 2) Dx: 174.9; Bilateral partial mastectomy Replacement due to hot flashes since previous items were too heavy and hot  1 each  0   No current facility-administered medications for this visit.     Patient last received chemotherapy/ treatment on n/a  Patient was last seen in the office on 03/06/2013 by Ashley Flesher PA-C  Is patient having fevers greater than 100.5?  no   Is patient having uncontrolled pain, or new pain? yes   Is patient having new back pain that changes with position (worsens or eases when laying down?)  no   Is patient able to eat and drink? yes    Is patient able to pass  stool without difficulty?   yes     Is patient having uncontrolled nausea?  no    patient calls 04/28/2013 with complaint of Pain   Summary Based on the above information advised patient to  Take pain medicine as ordered.  Refill will be provided.  Referral request for Rheumatologist will be requested of providers and we'll be in touch.   Ashley Lindsey  04/28/2013, 4:22 PM

## 2013-04-28 NOTE — Telephone Encounter (Signed)
Rheumatology referral was made by PCP Dr. Karlton Lemon.  Message left with instructions for patient to call Dr. Karlton Lemon to have this referral rescheduled.

## 2013-04-28 NOTE — Telephone Encounter (Signed)
Per PA-C request. Called pt to inform her of the results of her MR of the lumbar spine.Pt verbalized the tramadol is helping her back pain. Would like to be referred to rheumatologist.

## 2013-04-29 NOTE — Telephone Encounter (Signed)
sw scheduler @ [employer] office. gv the info that was required for Kim to fax....td

## 2013-04-30 ENCOUNTER — Telehealth: Payer: Self-pay | Admitting: Oncology

## 2013-04-30 NOTE — Telephone Encounter (Signed)
Faxed pt medical records to Dr. Amil Amen

## 2013-05-05 ENCOUNTER — Inpatient Hospital Stay: Admission: RE | Admit: 2013-05-05 | Payer: Medicare Other | Source: Ambulatory Visit

## 2013-05-13 ENCOUNTER — Telehealth: Payer: Self-pay | Admitting: *Deleted

## 2013-05-13 DIAGNOSIS — C50919 Malignant neoplasm of unspecified site of unspecified female breast: Secondary | ICD-10-CM

## 2013-05-13 NOTE — Telephone Encounter (Signed)
Patient calling in to make appt, feeling very nervous about not being on Tamoxifen. She was told she has "hard breast and needs to see her oncologists for further explaination",  she is going to MRI of breast tomorrow and wants to see [place] for follow up. Ashley Flesher, PA suggested patient have tests and come back in to see physician. Spoke with Ashley Lindsey, she will be glad to see patient for Ashley Lindsey so patient can be soon sooner to discuss her concerns.

## 2013-05-14 ENCOUNTER — Other Ambulatory Visit (HOSPITAL_BASED_OUTPATIENT_CLINIC_OR_DEPARTMENT_OTHER): Payer: Medicare HMO

## 2013-05-14 ENCOUNTER — Other Ambulatory Visit: Payer: Self-pay | Admitting: *Deleted

## 2013-05-14 ENCOUNTER — Ambulatory Visit: Admission: RE | Admit: 2013-05-14 | Payer: Medicare Other | Source: Ambulatory Visit

## 2013-05-14 DIAGNOSIS — C50419 Malignant neoplasm of upper-outer quadrant of unspecified female breast: Secondary | ICD-10-CM

## 2013-05-14 DIAGNOSIS — C50919 Malignant neoplasm of unspecified site of unspecified female breast: Secondary | ICD-10-CM

## 2013-05-14 LAB — COMPREHENSIVE METABOLIC PANEL (CC13)
ALT: 20 U/L (ref 0–55)
AST: 15 U/L (ref 5–34)
Albumin: 3.7 g/dL (ref 3.5–5.0)
Alkaline Phosphatase: 99 U/L (ref 40–150)
Anion Gap: 10 mEq/L (ref 3–11)
BUN: 22.7 mg/dL (ref 7.0–26.0)
CO2: 26 mEq/L (ref 22–29)
Calcium: 9.8 mg/dL (ref 8.4–10.4)
Chloride: 106 mEq/L (ref 98–109)
Creatinine: 0.8 mg/dL (ref 0.6–1.1)
Glucose: 108 mg/dl (ref 70–140)
Potassium: 3.9 mEq/L (ref 3.5–5.1)
Sodium: 142 mEq/L (ref 136–145)
Total Bilirubin: <0.2 mg/dL (ref 0.20–1.20)
Total Protein: 7.5 g/dL (ref 6.4–8.3)

## 2013-05-14 LAB — CBC WITH DIFFERENTIAL/PLATELET
BASO%: 0.9 % (ref 0.0–2.0)
Basophils Absolute: 0.1 10*3/uL (ref 0.0–0.1)
EOS%: 0.5 % (ref 0.0–7.0)
Eosinophils Absolute: 0.1 10*3/uL (ref 0.0–0.5)
HCT: 40.7 % (ref 34.8–46.6)
HGB: 13.1 g/dL (ref 11.6–15.9)
LYMPH%: 30.2 % (ref 14.0–49.7)
MCH: 27.4 pg (ref 25.1–34.0)
MCHC: 32.2 g/dL (ref 31.5–36.0)
MCV: 85 fL (ref 79.5–101.0)
MONO#: 0.6 10*3/uL (ref 0.1–0.9)
MONO%: 4.7 % (ref 0.0–14.0)
NEUT#: 7.7 10*3/uL — ABNORMAL HIGH (ref 1.5–6.5)
NEUT%: 63.7 % (ref 38.4–76.8)
Platelets: 286 10*3/uL (ref 145–400)
RBC: 4.79 10*6/uL (ref 3.70–5.45)
RDW: 14.1 % (ref 11.2–14.5)
WBC: 12.1 10*3/uL — ABNORMAL HIGH (ref 3.9–10.3)
lymph#: 3.6 10*3/uL — ABNORMAL HIGH (ref 0.9–3.3)

## 2013-05-15 ENCOUNTER — Telehealth: Payer: Self-pay | Admitting: Oncology

## 2013-05-19 ENCOUNTER — Telehealth: Payer: Self-pay | Admitting: *Deleted

## 2013-05-19 ENCOUNTER — Ambulatory Visit (HOSPITAL_BASED_OUTPATIENT_CLINIC_OR_DEPARTMENT_OTHER): Payer: Medicare HMO | Admitting: Hematology and Oncology

## 2013-05-19 ENCOUNTER — Other Ambulatory Visit: Payer: Self-pay | Admitting: Hematology and Oncology

## 2013-05-19 ENCOUNTER — Encounter (INDEPENDENT_AMBULATORY_CARE_PROVIDER_SITE_OTHER): Payer: Self-pay

## 2013-05-19 VITALS — BP 119/87 | HR 101 | Temp 97.7°F | Resp 18 | Ht 63.0 in | Wt 246.8 lb

## 2013-05-19 DIAGNOSIS — I890 Lymphedema, not elsewhere classified: Secondary | ICD-10-CM

## 2013-05-19 DIAGNOSIS — M545 Low back pain, unspecified: Secondary | ICD-10-CM

## 2013-05-19 DIAGNOSIS — C50919 Malignant neoplasm of unspecified site of unspecified female breast: Secondary | ICD-10-CM

## 2013-05-19 DIAGNOSIS — Z23 Encounter for immunization: Secondary | ICD-10-CM

## 2013-05-19 DIAGNOSIS — C50419 Malignant neoplasm of upper-outer quadrant of unspecified female breast: Secondary | ICD-10-CM

## 2013-05-19 DIAGNOSIS — Z853 Personal history of malignant neoplasm of breast: Secondary | ICD-10-CM

## 2013-05-19 MED ORDER — INFLUENZA VAC SPLIT QUAD 0.5 ML IM SUSP
0.5000 mL | Freq: Once | INTRAMUSCULAR | Status: AC
  Administered 2013-05-19: 0.5 mL via INTRAMUSCULAR
  Filled 2013-05-19: qty 0.5

## 2013-05-19 NOTE — Progress Notes (Signed)
ID: Ashley Lindsey   DOB: May 02, 1960  MR#: [id number]  EEF#:[id number]  PCP: [name]., MD GYN: Lahoma Crocker SU: Fanny Skates OTHER MD: Gery Pray  DIAGNOSIS: Bilateral Breast Cancers  CHIEF COMPLAINT:  Breast cancer followup  Current therapy: Observation  HISTORY OF PRESENT ILLNESS: From Dr. Rada Hay 07/09/2007 intake total  "This woman has been in good health.  She undergoes annual screening mammography.  She had a screening mammogram in November, 2008 that showed calcifications in the left breast.  Additional views were recommended, which took place on 05/02/2007.  This showed a suspicious cluster of microcalcifications upper outer quadrant of left breast.  Stereotactic biopsy was recommended.  Biopsy on 05/27/2007 showed DCIS with calcifications, ER/PR positive at 100% respectively.  A preoperative MRI scan performed on 06/04/2007 showed a 1.7 x 1.4 x 0.9 cm DCIS at 12 o'clock position left breast.  No other abnormalities were seen.  Ashley Lindsey underwent a lumpectomy on 06/20/2007.  Final pathology case number [id number] showed a 0.4-0.5 cm focus of invasive ductal cancer grade 1 of 3 with tubular features, clear surgical margins.  Lymphovascular invasion was not seen.  Associated DCIS measuring 0.4 cm was seen.  In addition, the tumor was ER/PR positive at 77% and 96% respectively, proliferative index less than 5%, HER-2 was 0.  She has had an unremarkable postoperative course"  Her subsequent history is as detailed below.  INTERVAL HISTORY: Ashley Lindsey alone today for followup of her bilateral breast cancers. She's currently being followed with observation alone.  Ashley Lindsey  complains of diffuse body pains recently evaluated by rheumatology and was given a course of prednisone at present she is on 50 mg of prednisone . she complains of bloating. She says that she hurts all over the body. She is only,, muscle relaxant and also ultram with minimal pain relief. She had MRI  of the lumbar spine and that revealed arthritic changes in the lower lumbar spine.  She'll be scheduled MRI of the breasts on 05/25/2013.  She is status post recent mammogram on 01/22/2013 which was unremarkable with the exception of dense breast tissue.  She tells me the pain in her breast is sharp and persistent, and the breasts are extremely tender to touch bilaterally. She also has some intermittent swelling in her left upper extremity associated with lymphedema. She was evaluated in the past at the lymphedema clinic in 2012. She says that she hasn't gotten flu vaccine this year and  she would like to get the flu vaccine  REVIEW OF SYSTEMS: Is documented in interval history  A detailed review of systems was otherwise noncontributory  PAST MEDICAL HISTORY: Past Medical History  Diagnosis Date  . Abdominal cramps   . Muscle spasms of head and/or neck     following to the breast  . Fatigue   . Passed out     4th of july  . Migraines   . Chronic headaches   . Arthritis   . Cancer     s/p radiation- last dose 6/12//has been off Tamoxifen 8/12  . Sleep apnea     STOP BANG SCORE 5  . Lymphedema     RIGHT ARM-////  STATES USE LEFT ARM FOR BP'S  . S/P radiation therapy 08/19/07 - 10/10/07    Left Breast/5040 cGy/28 fractions with Boost for a toatl dose of 6300 dGy  . S/P radiation therapy 08/14/10 -10/03/10    right breast  . Costochondritis   . Fibromyalgia 03/06/2013    PAST  SURGICAL HISTORY: Past Surgical History  Procedure Laterality Date  . Breast lumpectomy  2009/2012    LEFT/RIGHT with lymph node dissection  . Abdominal hysterectomy      with left salpingooophorectomy  . Knee arthroscopy      right  . Oophorectomy  2013    FAMILY HISTORY Family History  Problem Relation Age of Onset  . Breast cancer Mother 67  . Heart disease Father   . Prostate cancer Maternal Uncle 78  . Cancer Maternal Aunt 63    d. from "female cancer" possibly cervical  . Prostate cancer  Paternal Uncle   . Prostate cancer Maternal Grandfather 16  . Prostate cancer Maternal Uncle 80  . Prostate cancer Paternal Uncle   . Breast cancer Cousin 71    mat 1st cousin   the patient's father died from a heart attack at age 62. The patient's mother is currently 1. She was diagnosed with breast cancer at age 3. The patient's mother had one sister, diagnosed with cervical cancer at age 77. The patient's mother had two out of three brothers died from cancer, the patient believes prostate. Ashley Lindsey herself had 2 brothers and one sister. Non-of them have had cancer. There is no other history of breast cancer and no history of ovarian cancer in the family.  GYNECOLOGIC HISTORY:  (Updated November 2014)  Menarche age 43, first live birth age 59, she is [place] P1. 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 The patient shares a home with her God sister, Sherryll Burger. The patient's daughter, Myriam Forehand. Juleen China, is a Herbalist. The patient has no grandchildren. She attends a [employer]   ADVANCED DIRECTIVES: Not in place  HEALTH MAINTENANCE:  (updated November 2014)  History  Substance Use Topics  . Smoking status: Current Every Day Smoker -- 0.25 packs/day for 1 years    Types: Cigarettes  . Smokeless tobacco: Never Used  . Alcohol Use: Yes     Comment: 3 x a week     Colonoscopy: Not on file  PAP: Status post remote hysterectomy   Bone density:  December 2012, normal   Lipid panel: Dr. Karlton Lemon    Allergies  Allergen Reactions  . Aspirin     unknown  . Penicillins Hives and Swelling    Current Outpatient Prescriptions  Medication Sig Dispense Refill  . ALPRAZolam (XANAX) 1 MG tablet Take 1 mg by mouth 3 (three) times daily as needed. anxiety      . butalbital-aspirin-caffeine (FIORINAL) 50-325-40  MG per capsule Take 1 capsule by mouth 3 (three) times daily as needed. For headache      . cholecalciferol (VITAMIN D) 1000 UNITS tablet Take 2,000 Units by mouth 2 (two) times daily.      . cyclobenzaprine (FLEXERIL) 10 MG tablet Take 10 mg by mouth 3 (three) times daily as needed for muscle spasms.       Marland Kitchen esomeprazole (NEXIUM) 40 MG capsule Take 40 mg by mouth daily before breakfast.      . fish oil-omega-3 fatty acids 1000 MG capsule Take 1 capsule by mouth daily with breakfast.       . hydrochlorothiazide (HYDRODIURIL) 25 MG tablet Take 25 mg by mouth at bedtime.       Marland Kitchen ibuprofen (ADVIL,MOTRIN) 800 MG tablet Take 800 mg by mouth every 8 (eight)  hours as needed. For pain      . lisinopril (PRINIVIL,ZESTRIL) 10 MG tablet Take 10 mg by mouth daily.      . pregabalin (LYRICA) 75 MG capsule Take 1 capsule (75 mg total) by mouth 2 (two) times daily.  60 capsule  1  . senna (SENOKOT) 8.6 MG tablet Take 1 tablet by mouth every other day.       . traMADol (ULTRAM) 50 MG tablet Take 1 tablet (50 mg total) by mouth every 6 (six) hours as needed (pain).  60 tablet  1  . UNABLE TO FIND Rx: L8000-Post Surgical Bra (Quantity: 6) A1287- Silicone Breast Prosthesis (Quantity: 2) Dx: 174.9; Bilateral partial mastectomy Replacement due to hot flashes since previous items were too heavy and hot  1 each  0   No current facility-administered medications for this visit.    OBJECTIVE: Middle-aged [place] American woman who appears uncomfortable  Filed Vitals:   05/19/13 1445  BP: 119/87  Pulse: 101  Temp: 97.7 F (36.5 C)  Resp: 18     Body mass index is 43.73 kg/(m^2).    ECOG FS: 2 Filed Weights   05/19/13 1445  Weight: 246 lb 12.8 oz (111.948 kg)   Physical Exam: HEENT:  Sclerae anicteric.  Oropharynx clear. Buccal mucosa is pink and moist. NODES:  No cervical or supraclavicular lymphadenopathy palpated.  BREAST EXAM:  It was difficult to perform a thorough breast or axillary exam secondary to  the patient's pain with even gentle palpation. Both breasts are status post lumpectomy, and there was no obvious evidence of disease recurrence. LUNGS:  Clear to auscultation bilaterally.  No wheezes or rhonchi HEART:  Regular rate and rhythm. No murmur appreciated. ABDOMEN:  Soft, obese, nontender.  Positive bowel sounds.  MSK:  Tenderness to palpation of the lumbar sacral spine. Also tenderness with palpation of the hips bilaterally. Decreased range of motion in both the upper and lower extremities secondary to pain. EXTREMITIES: Mild lymphedema in the left upper extremity, nonpitting. No additional peripheral edema noted today. NEURO:  Nonfocal. Well oriented.  Anxious affect.    LAB RESULTS: Lab Results  Component Value Date   WBC 12.1* 05/14/2013   NEUTROABS 7.7* 05/14/2013   HGB 13.1 05/14/2013   HCT 40.7 05/14/2013   MCV 85.0 05/14/2013   PLT 286 05/14/2013      Chemistry      Component Value Date/Time   NA 142 05/14/2013 1609   NA 137 01/06/2013 1835   K 3.9 05/14/2013 1609   K 4.0 01/06/2013 1835   CL 103 01/06/2013 1835   CL 105 12/26/2011 1538   CO2 26 05/14/2013 1609   CO2 27 01/06/2013 1835   BUN 22.7 05/14/2013 1609   BUN 12 01/06/2013 1835   CREATININE 0.8 05/14/2013 1609   CREATININE 0.66 01/06/2013 1835      Component Value Date/Time   CALCIUM 9.8 05/14/2013 1609   CALCIUM 8.4 01/06/2013 1835   ALKPHOS 99 05/14/2013 1609   ALKPHOS 94 01/06/2013 1835   AST 15 05/14/2013 1609   AST 16 01/06/2013 1835   ALT 20 05/14/2013 1609   ALT 20 01/06/2013 1835   BILITOT <0.20 05/14/2013 1609   BILITOT 0.1* 01/06/2013 1835       STUDIES: Bone density 03/23/2011 was normal.   Bilateral mammogram on 01/22/2013 was unremarkable, although it did confirm bilateral heterogeneously dense breast tissue.     ASSESSMENT: 53 y.o. Ashley Lindsey woman  (1) status post left lumpectomy 06/20/2007 for  a pT1a invasive ductal carcinoma, grade 1, estrogen receptor 77%, progesterone receptor and 96%, with  an MIB-1 of less than 5%, and no HER-2 amplification  (2) left sentinel lymph node biopsy (report erroneously states "right") 07/21/2007, was pN0(i+)  (3) left breast adjuvant radiation completed 06/19/200  (4) tamoxifen started September 2009, discontinued March 2012  (5) status post right lumpectomy 05/26/2010 for ductal carcinoma in situ, with margins cleared 08/11/2010.  The closest margin (deep) was 1.8 cm. The HER-2 repeat was performed on the smaller nodule on the skin.   (6) status post right breast adjuvant radiation completed 10/03/2010  (7)  currently followed with observation alone   (8)  intermittent left upper extremity lymphedema  (9)  postsurgical pain bilaterally in the breasts  (10)  heterogeneously dense breast tissue bilaterally  (11) diffuse pain associated with fibromyalgia  (12)   recent onset of lower back pain   (13)  genetic testing in May 2014, showed patient to be  heterozygous for a variant of unknown significance in BRCA1.   PLAN:  [name] has seen the rheumatologist for diffuse body pains since the time of last visit and she was given a course of prednisone. She is  on Lyrica, muscle relaxant and also ultram as needed for pain. she says that she gets minimal pain relief with these medications. Follow up with rheumatology as scheduled next week. Continue pain medications as recommended With respect to her breast cancers - Her mammogram performed in October 2014 showed no evidence of malignancy. Follow up with MRI of the breasts as scheduled on 05/26/2013 Ashley Lindsey has been referred to physical therapy for lymphedema treatment for left upper extremity intermittent pain/ bilateral breast pain and swelling. The breast pain she is experiencing is likely associated with postsurgical pain.   I have reviewed MRI of the lumbosacral spine performed on 04/27/2013 for lower back pain. That revealed arthritis in lower lumbar spine.   She'll continue to followup  regularly with Dr. Karlton Lemon, and will also keep her f/u appointment with the rheumatologist.  We will arrange for flu vaccine to be given today  Next followup visit in second or third week of February 2015    She  voices nderstanding and agreement with this plan. She will call with any changes or problems prior to her next appointment.  Wilmon Arms, M.D. Medical oncology     05/19/2013

## 2013-05-19 NOTE — Telephone Encounter (Signed)
appts made and printed. Pt plans on calling to schedule her own lymphedema appt...td

## 2013-05-19 NOTE — Progress Notes (Unsigned)
ID: Hart Carwin   DOB: 26-May-1960  MR#: [id number]  IWP#:[id number]  PCP: [name]., MD GYN: Lahoma Crocker SU: Fanny Skates OTHER MD: Gery Pray  CHIEF COMPLAINT:  Bilateral Breast Cancers   HISTORY OF PRESENT ILLNESS: From Dr. Rada Hay 07/09/2007 intake total  "This woman has been in good health.  She undergoes annual screening mammography.  She had a screening mammogram in November, 2008 that showed calcifications in the left breast.  Additional views were recommended, which took place on 05/02/2007.  This showed a suspicious cluster of microcalcifications upper outer quadrant of left breast.  Stereotactic biopsy was recommended.  Biopsy on 05/27/2007 showed DCIS with calcifications, ER/PR positive at 100% respectively.  A preoperative MRI scan performed on 06/04/2007 showed a 1.7 x 1.4 x 0.9 cm DCIS at 12 o'clock position left breast.  No other abnormalities were seen.  Ms. Rasp underwent a lumpectomy on 06/20/2007.  Final pathology case number [id number] showed a 0.4-0.5 cm focus of invasive ductal cancer grade 1 of 3 with tubular features, clear surgical margins.  Lymphovascular invasion was not seen.  Associated DCIS measuring 0.4 cm was seen.  In addition, the tumor was ER/PR positive at 77% and 96% respectively, proliferative index less than 5%, HER-2 was 0.  She has had an unremarkable postoperative course"  Her subsequent history is as detailed below.  INTERVAL HISTORY: Swathi returns alone today for followup of her bilateral breast cancers. She's currently being followed with observation alone.   Guneet have multiple concerns to discuss today. Her primary complaint continues to be diffuse pain which she describes as severe. It is limiting her day-to-day activity, and decreasing her functional status. In fact her daughter often helps her to rest. She has been diagnosed with fibromyalgia since her breast cancer diagnosis. Her primary care physician, Dr. Karlton Lemon, has  scheduled her to see a rheumatologist. Dallie is not sure of the rheumatologist's name, but knows that her appointment is "soon".  In the meanwhile, she is on Lyrica, 75 mg twice daily. She was previously on Cymbalta which she tells me she did not tolerate.    She tells me it "hurts all over" although the most significant pain is currently in the lower back, and also bilaterally in the breasts, although the left is worse than the right. She denies any known injuries to her back, and to her knowledge has never had a spinal MRI for evaluation. She tells me the pain radiates around the hips, and often down the buttocks into the legs. This makes it difficult for her to walk due to the pain.  She is status post recent mammogram on 01/22/2013 which was unremarkable with the exception of dense breast tissue. She tells me the pain in her breast is sharp and persistent, and the breasts are extremely tender to touch bilaterally.  She also has some intermittent swelling in her left upper extremity associated with lymphedema. She has been evaluated in the past at the lymphedema clinic she tells me, and Dr. Jana Hakim had previously suggested that she get a compression sleeve.   REVIEW OF SYSTEMS: Shyanna has had no recent illnesses and denies any fevers or chills. She does have hot flashes. She denies any skin changes or rashes. She's had no abnormal bleeding. Her energy level is very low. Her appetite is reduced, although she denies any nausea or emesis. She tends to have some mild constipation for which she takes stool softeners regularly. She has shortness of breath with exertion, which is  stable. She denies any increased cough or phlegm production and has had no chest pain or palpitations. She's had no abnormal headaches and denies any dizziness. She has occasional swelling in the feet and ankles, which improves with elevation.   A detailed review of systems was otherwise noncontributory  PAST MEDICAL HISTORY: Past  Medical History  Diagnosis Date  . Abdominal cramps   . Muscle spasms of head and/or neck     following to the breast  . Fatigue   . Passed out     4th of july  . Migraines   . Chronic headaches   . Arthritis   . Cancer     s/p radiation- last dose 6/12//has been off Tamoxifen 8/12  . Sleep apnea     STOP BANG SCORE 5  . Lymphedema     RIGHT ARM-////  STATES USE LEFT ARM FOR BP'S  . S/P radiation therapy 08/19/07 - 10/10/07    Left Breast/5040 cGy/28 fractions with Boost for a toatl dose of 6300 dGy  . S/P radiation therapy 08/14/10 -10/03/10    right breast  . Costochondritis   . Fibromyalgia 03/06/2013    PAST SURGICAL HISTORY: Past Surgical History  Procedure Laterality Date  . Breast lumpectomy  2009/2012    LEFT/RIGHT with lymph node dissection  . Abdominal hysterectomy      with left salpingooophorectomy  . Knee arthroscopy      right  . Oophorectomy  2013    FAMILY HISTORY Family History  Problem Relation Age of Onset  . Breast cancer Mother 63  . Heart disease Father   . Prostate cancer Maternal Uncle 78  . Cancer Maternal Aunt 63    d. from "female cancer" possibly cervical  . Prostate cancer Paternal Uncle   . Prostate cancer Maternal Grandfather 13  . Prostate cancer Maternal Uncle 80  . Prostate cancer Paternal Uncle   . Breast cancer Cousin 22    mat 1st cousin   the patient's father died from a heart attack at age 57. The patient's mother is currently 24. She was diagnosed with breast cancer at age 19. The patient's mother had one sister, diagnosed with cervical cancer at age 22. The patient's mother had two out of three brothers died from cancer, the patient believes prostate. [name] herself had 2 brothers and one sister. Non-of them have had cancer. There is no other history of breast cancer and no history of ovarian cancer in the family.  GYNECOLOGIC HISTORY:  (Updated November 2014)  Menarche age 23, first live birth age 36, she is [place] P1. 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 The patient shares a home with her God sister, Sherryll Burger. The patient's daughter, Myriam Forehand. Juleen China, is a Herbalist. The patient has no grandchildren. She attends a [employer]   ADVANCED DIRECTIVES: Not in place  HEALTH MAINTENANCE:  (updated November 2014)  History  Substance Use Topics  . Smoking status: Current Every Day Smoker -- 0.25 packs/day for 1 years    Types: Cigarettes  . Smokeless tobacco: Never Used  . Alcohol Use: Yes     Comment: 3 x a week     Colonoscopy: Not on file  PAP: Status post remote hysterectomy   Bone density:  December 2012, normal   Lipid panel: Dr. Karlton Lemon  Allergies  Allergen Reactions  . Aspirin     unknown  . Penicillins Hives and Swelling    Current Outpatient Prescriptions  Medication Sig Dispense Refill  . ALPRAZolam (XANAX) 1 MG tablet Take 1 mg by mouth 3 (three) times daily as needed. anxiety      . butalbital-aspirin-caffeine (FIORINAL) 50-325-40 MG per capsule Take 1 capsule by mouth 3 (three) times daily as needed. For headache      . cholecalciferol (VITAMIN D) 1000 UNITS tablet Take 2,000 Units by mouth 2 (two) times daily.      . cyclobenzaprine (FLEXERIL) 10 MG tablet Take 10 mg by mouth 3 (three) times daily as needed for muscle spasms.       Marland Kitchen esomeprazole (NEXIUM) 40 MG capsule Take 40 mg by mouth daily before breakfast.      . fish oil-omega-3 fatty acids 1000 MG capsule Take 1 capsule by mouth daily with breakfast.       . hydrochlorothiazide (HYDRODIURIL) 25 MG tablet Take 25 mg by mouth at bedtime.       Marland Kitchen ibuprofen (ADVIL,MOTRIN) 800 MG tablet Take 800 mg by mouth every 8 (eight) hours as needed. For pain      . lisinopril (PRINIVIL,ZESTRIL) 10 MG tablet Take 10 mg by mouth daily.      .  pregabalin (LYRICA) 75 MG capsule Take 1 capsule (75 mg total) by mouth 2 (two) times daily.  60 capsule  1  . senna (SENOKOT) 8.6 MG tablet Take 1 tablet by mouth every other day.       . traMADol (ULTRAM) 50 MG tablet Take 1 tablet (50 mg total) by mouth every 6 (six) hours as needed (pain).  60 tablet  1  . UNABLE TO FIND Rx: L8000-Post Surgical Bra (Quantity: 6) X0960- Silicone Breast Prosthesis (Quantity: 2) Dx: 174.9; Bilateral partial mastectomy Replacement due to hot flashes since previous items were too heavy and hot  1 each  0   No current facility-administered medications for this visit.    OBJECTIVE: Middle-aged [place] American woman who appears uncomfortable  There were no vitals filed for this visit.   There is no weight on file to calculate BMI.    ECOG FS: 2 There were no vitals filed for this visit. Physical Exam: HEENT:  Sclerae anicteric.  Oropharynx clear. Buccal mucosa is pink and moist. NODES:  No cervical or supraclavicular lymphadenopathy palpated.  BREAST EXAM:  It was difficult to perform a thorough breast or axillary exam secondary to the patient's pain with even gentle palpation. Both breasts are status post lumpectomy, and there was no obvious evidence of disease recurrence. Breast tissue is dense bilaterally. I was unable to palpate any axillary lymphadenopathy on either the right or the left. LUNGS:  Clear to auscultation bilaterally.  No wheezes or rhonchi HEART:  Regular rate and rhythm. No murmur appreciated. ABDOMEN:  Soft, obese, nontender.  Positive bowel sounds.  MSK:  Tenderness to palpation of the lumbar sacral spine. Also tenderness with palpation of the hips bilaterally. Decreased range of motion in both the upper and lower extremities secondary to pain. EXTREMITIES: Mild lymphedema in the left upper extremity, nonpitting. No additional peripheral edema noted today. NEURO:  Nonfocal. Well oriented.  Anxious affect.    LAB RESULTS: Lab Results   Component Value Date   WBC 12.1* 05/14/2013   NEUTROABS 7.7* 05/14/2013   HGB 13.1 05/14/2013   HCT 40.7 05/14/2013   MCV 85.0 05/14/2013   PLT 286 05/14/2013  Chemistry      Component Value Date/Time   NA 142 05/14/2013 1609   NA 137 01/06/2013 1835   K 3.9 05/14/2013 1609   K 4.0 01/06/2013 1835   CL 103 01/06/2013 1835   CL 105 12/26/2011 1538   CO2 26 05/14/2013 1609   CO2 27 01/06/2013 1835   BUN 22.7 05/14/2013 1609   BUN 12 01/06/2013 1835   CREATININE 0.8 05/14/2013 1609   CREATININE 0.66 01/06/2013 1835      Component Value Date/Time   CALCIUM 9.8 05/14/2013 1609   CALCIUM 8.4 01/06/2013 1835   ALKPHOS 99 05/14/2013 1609   ALKPHOS 94 01/06/2013 1835   AST 15 05/14/2013 1609   AST 16 01/06/2013 1835   ALT 20 05/14/2013 1609   ALT 20 01/06/2013 1835   BILITOT <0.20 05/14/2013 1609   BILITOT 0.1* 01/06/2013 1835       STUDIES: Bone density 03/23/2011 was normal.   Bilateral mammogram on 01/22/2013 was unremarkable, although it did confirm bilateral heterogeneously dense breast tissue.     ASSESSMENT: 53 y.o. [place] woman  (1) status post left lumpectomy 06/20/2007 for a pT1a invasive ductal carcinoma, grade 1, estrogen receptor 77%, progesterone receptor and 96%, with an MIB-1 of less than 5%, and no HER-2 amplification  (2) left sentinel lymph node biopsy (report erroneously states "right") 07/21/2007, was pN0(i+)  (3) left breast adjuvant radiation completed 06/19/200  (4) tamoxifen started September 2009, discontinued March 2012  (5) status post right lumpectomy 05/26/2010 for ductal carcinoma in situ, with margins cleared 08/11/2010.  The closest margin (deep) was 1.8 cm. The HER-2 repeat was performed on the smaller nodule on the skin.   (6) status post right breast adjuvant radiation completed 10/03/2010  (7)  currently followed with observation alone   (8)  intermittent left upper extremity lymphedema  (9)  postsurgical pain bilaterally in the  breasts  (10)  heterogeneously dense breast tissue bilaterally  (11) diffuse pain associated with fibromyalgia  (12)   recent onset of lower back pain   (13)  genetic testing in May 2014, showed patient to be  heterozygous for a variant of unknown significance in BRCA1.   PLAN:  Over half of our 50 minute appointment today was spent discussing the patient's concerns, answering her questions, and coordinating care.   With regards to her breast cancer diagnosis, Idania is actually doing extremely well. According to Dr. Jana Hakim prior notes, she has a great prognosis and a very low risk of recurrence. He is still comfortable following her with observation alone, with no additional anti-estrogen therapy.  The breast pain she is experiencing is likely associated with postsurgical pain. Although she recently had a mammogram, she does have dense breast tissue, I am going to order a breast MRI to be done for further evaluation.  She'll continue on the Lyrica. I am also adding tramadol, 50 mg up to 4 times daily to see if this helps with the pain.  We will also obtain an MRI of the lumbosacral spine for further evaluation of the recent onset of lower back pain. I do not think this is going to be associated with her breast cancer diagnosis, and we will refer her to either orthopedics or neurology if necessary, depending on those scan results.  I am also referring her back to the lymphedema clinic for further evaluation of intermittent swelling, pain in the arm and left axilla, and measurement for compression sleeve.  She'll continue to followup regularly with Dr.  Karlton Lemon, and will also keep her upcoming appointment with the rheumatologist.  She will be due to return here in February or March of next year for labs and physical exam. Ladavia tells me that she would really prefer a female physician, and accordingly would like to transfer to Dr. Laurelyn Sickle service. I will place this request, and she would like to  meet with Dr. Humphrey Rolls she returns for her next visit.  She was given all of the above information in writing today, and voices her understanding and agreement with this plan. She will call with any changes or problems prior to her next appointment.  Earnest Conroy, Link Burgeson PA-C     05/19/2013

## 2013-05-19 NOTE — Patient Instructions (Signed)
Influenza Virus Vaccine injection (Fluarix) What is this medicine? INFLUENZA VIRUS VACCINE (in floo EN zuh VAHY ruhs vak SEEN) helps to reduce the risk of getting influenza also known as the flu. This medicine may be used for other purposes; ask your health care provider or pharmacist if you have questions. COMMON BRAND NAME(S): Fluarix, Fluzone What should I tell my health care provider before I take this medicine? They need to know if you have any of these conditions: -bleeding disorder like hemophilia -fever or infection -Guillain-Barre syndrome or other neurological problems -immune system problems -infection with the human immunodeficiency virus (HIV) or AIDS -low blood platelet counts -multiple sclerosis -an unusual or allergic reaction to influenza virus vaccine, eggs, chicken proteins, latex, gentamicin, other medicines, foods, dyes or preservatives -pregnant or trying to get pregnant -breast-feeding How should I use this medicine? This vaccine is for injection into a muscle. It is given by a health care professional. A copy of Vaccine Information Statements will be given before each vaccination. Read this sheet carefully each time. The sheet may change frequently. Talk to your pediatrician regarding the use of this medicine in children. Special care may be needed. Overdosage: If you think you have taken too much of this medicine contact a poison control center or emergency room at once. NOTE: This medicine is only for you. Do not share this medicine with others. What if I miss a dose? This does not apply. What may interact with this medicine? -chemotherapy or radiation therapy -medicines that lower your immune system like etanercept, anakinra, infliximab, and adalimumab -medicines that treat or prevent blood clots like warfarin -phenytoin -steroid medicines like prednisone or cortisone -theophylline -vaccines This list may not describe all possible interactions. Give your  health care provider a list of all the medicines, herbs, non-prescription drugs, or dietary supplements you use. Also tell them if you smoke, drink alcohol, or use illegal drugs. Some items may interact with your medicine. What should I watch for while using this medicine? Report any side effects that do not go away within 3 days to your doctor or health care professional. Call your health care provider if any unusual symptoms occur within 6 weeks of receiving this vaccine. You may still catch the flu, but the illness is not usually as bad. You cannot get the flu from the vaccine. The vaccine will not protect against colds or other illnesses that may cause fever. The vaccine is needed every year. What side effects may I notice from receiving this medicine? Side effects that you should report to your doctor or health care professional as soon as possible: -allergic reactions like skin rash, itching or hives, swelling of the face, lips, or tongue Side effects that usually do not require medical attention (report to your doctor or health care professional if they continue or are bothersome): -fever -headache -muscle aches and pains -pain, tenderness, redness, or swelling at site where injected -weak or tired This list may not describe all possible side effects. Call your doctor for medical advice about side effects. You may report side effects to FDA at 1-800-FDA-1088. Where should I keep my medicine? This vaccine is only given in a clinic, pharmacy, doctor's office, or other health care setting and will not be stored at home. NOTE: This sheet is a summary. It may not cover all possible information. If you have questions about this medicine, talk to your doctor, pharmacist, or health care provider.  2014, Elsevier/Gold Standard. (2007-11-05 09:30:40)

## 2013-05-21 ENCOUNTER — Telehealth: Payer: Self-pay | Admitting: *Deleted

## 2013-05-21 NOTE — Telephone Encounter (Addendum)
Pt called today with complaints of body aches,soreness and chills. T max remains 99.7 oral taken  at 11:00a and stuffy nose. Pt received flu vaccination on 05/19/2013. Pt states she has been taking Tylenol since she took the shot. I did offer her an appt for tomorrow to be seen, pt declined stating, " I will try to tough it out for the weekend and drink some tea". Encouraged pt to drink plenty of fluids, continue to take Tylenol for general aches and low grade temp. Verbalized to pt if things get worse to give [place] a call to come in by tomorrow or if it's the weekend to go to the emergency room. Message to be forwarded to Dr. Earnest Conroy.

## 2013-05-25 ENCOUNTER — Other Ambulatory Visit: Payer: Medicare Other

## 2013-06-03 ENCOUNTER — Telehealth: Payer: Self-pay | Admitting: Oncology

## 2013-06-03 ENCOUNTER — Ambulatory Visit: Payer: Medicare Other

## 2013-06-03 NOTE — Telephone Encounter (Signed)
pt called today to confirm appts and check on the status of provider change. confirmed w/pt each appt currently on schedule and sent a message to [facility] navigatiors and assistant  re contacting pt on status of provider change. when pt was last seen 05/19/13 [facility] navigator/assistant informed of pt's request for provider change.

## 2013-06-04 ENCOUNTER — Telehealth: Payer: Self-pay | Admitting: *Deleted

## 2013-06-04 ENCOUNTER — Inpatient Hospital Stay: Admission: RE | Admit: 2013-06-04 | Payer: Medicare Other | Source: Ambulatory Visit

## 2013-06-04 NOTE — Telephone Encounter (Signed)
Called pt to schedule her w/ Dr. Humphrey Rolls and she is sick with the flu and requested to get scheduled after her MRI on 2/20.  Told her that I would go ahead and cancel the appts for Dr. Raliegh Ip and Amy and she is fine with that.  Took paperwork back to Dr. Humphrey Rolls for another appt.

## 2013-06-08 ENCOUNTER — Ambulatory Visit: Payer: Medicare Other | Admitting: Physical Therapy

## 2013-06-09 ENCOUNTER — Telehealth: Payer: Self-pay | Admitting: *Deleted

## 2013-06-09 NOTE — Telephone Encounter (Signed)
Called and spoke with patient and confirmed appointment for 06/18/13 at 1130 lab and Dr. Humphrey Rolls at 1145.

## 2013-06-11 ENCOUNTER — Ambulatory Visit: Payer: Medicare Other

## 2013-06-12 ENCOUNTER — Ambulatory Visit
Admission: RE | Admit: 2013-06-12 | Discharge: 2013-06-12 | Disposition: A | Discharge status: Home / Self Care | Payer: Commercial Managed Care - HMO | Source: Ambulatory Visit | Attending: Physician Assistant | Admitting: Physician Assistant

## 2013-06-12 DIAGNOSIS — Z853 Personal history of malignant neoplasm of breast: Secondary | ICD-10-CM

## 2013-06-12 DIAGNOSIS — R922 Inconclusive mammogram: Secondary | ICD-10-CM

## 2013-06-12 DIAGNOSIS — N644 Mastodynia: Secondary | ICD-10-CM

## 2013-06-12 MED ORDER — GADOBENATE DIMEGLUMINE 529 MG/ML IV SOLN
20.0000 mL | Freq: Once | INTRAVENOUS | Status: AC | PRN
  Administered 2013-06-12: 20 mL via INTRAVENOUS

## 2013-06-15 ENCOUNTER — Encounter: Payer: Self-pay | Admitting: *Deleted

## 2013-06-15 ENCOUNTER — Ambulatory Visit: Payer: Medicare HMO | Attending: Oncology | Admitting: Physical Therapy

## 2013-06-15 ENCOUNTER — Telehealth: Payer: Self-pay | Admitting: Oncology

## 2013-06-15 DIAGNOSIS — M24519 Contracture, unspecified shoulder: Secondary | ICD-10-CM | POA: Insufficient documentation

## 2013-06-15 DIAGNOSIS — M25519 Pain in unspecified shoulder: Secondary | ICD-10-CM | POA: Insufficient documentation

## 2013-06-15 DIAGNOSIS — I890 Lymphedema, not elsewhere classified: Secondary | ICD-10-CM | POA: Insufficient documentation

## 2013-06-15 DIAGNOSIS — IMO0001 Reserved for inherently not codable concepts without codable children: Secondary | ICD-10-CM | POA: Insufficient documentation

## 2013-06-15 NOTE — Telephone Encounter (Signed)
, °

## 2013-06-16 ENCOUNTER — Ambulatory Visit: Payer: Medicare HMO | Admitting: Physical Therapy

## 2013-06-18 ENCOUNTER — Other Ambulatory Visit: Payer: Medicare Other

## 2013-06-18 ENCOUNTER — Ambulatory Visit: Payer: Medicare Other | Admitting: Oncology

## 2013-06-18 ENCOUNTER — Ambulatory Visit: Payer: Medicare HMO | Admitting: Physical Therapy

## 2013-06-25 ENCOUNTER — Ambulatory Visit: Payer: Medicare HMO | Admitting: Physical Therapy

## 2013-06-29 ENCOUNTER — Encounter: Payer: Self-pay | Admitting: *Deleted

## 2013-06-29 NOTE — Progress Notes (Signed)
Completed chart, labs entered, placed chart in Dr. Laurelyn Sickle box.

## 2013-07-01 NOTE — Telephone Encounter (Signed)
Pt did not call back to schedule w/LL closing encounter

## 2013-07-02 ENCOUNTER — Ambulatory Visit (HOSPITAL_BASED_OUTPATIENT_CLINIC_OR_DEPARTMENT_OTHER): Payer: Commercial Managed Care - HMO | Admitting: Oncology

## 2013-07-02 ENCOUNTER — Telehealth: Payer: Self-pay | Admitting: *Deleted

## 2013-07-02 ENCOUNTER — Other Ambulatory Visit (HOSPITAL_BASED_OUTPATIENT_CLINIC_OR_DEPARTMENT_OTHER): Payer: Commercial Managed Care - HMO

## 2013-07-02 VITALS — BP 127/89 | HR 98 | Temp 98.1°F | Resp 20 | Ht 63.0 in | Wt 245.8 lb

## 2013-07-02 DIAGNOSIS — D0590 Unspecified type of carcinoma in situ of unspecified breast: Secondary | ICD-10-CM

## 2013-07-02 DIAGNOSIS — Z853 Personal history of malignant neoplasm of breast: Secondary | ICD-10-CM

## 2013-07-02 DIAGNOSIS — C50919 Malignant neoplasm of unspecified site of unspecified female breast: Secondary | ICD-10-CM

## 2013-07-02 DIAGNOSIS — IMO0001 Reserved for inherently not codable concepts without codable children: Secondary | ICD-10-CM

## 2013-07-02 DIAGNOSIS — R609 Edema, unspecified: Secondary | ICD-10-CM

## 2013-07-02 LAB — COMPREHENSIVE METABOLIC PANEL (CC13)
ALT: 31 U/L (ref 0–55)
AST: 24 U/L (ref 5–34)
Albumin: 3.8 g/dL (ref 3.5–5.0)
Alkaline Phosphatase: 98 U/L (ref 40–150)
Anion Gap: 9 mEq/L (ref 3–11)
BUN: 14.2 mg/dL (ref 7.0–26.0)
CO2: 23 mEq/L (ref 22–29)
Calcium: 9.5 mg/dL (ref 8.4–10.4)
Chloride: 108 mEq/L (ref 98–109)
Creatinine: 0.7 mg/dL (ref 0.6–1.1)
Glucose: 99 mg/dl (ref 70–140)
Potassium: 4 mEq/L (ref 3.5–5.1)
Sodium: 140 mEq/L (ref 136–145)
Total Bilirubin: <0.2 mg/dL (ref 0.20–1.20)
Total Protein: 7.6 g/dL (ref 6.4–8.3)

## 2013-07-02 LAB — CBC WITH DIFFERENTIAL/PLATELET
BASO%: 0.6 % (ref 0.0–2.0)
Basophils Absolute: 0.1 10*3/uL (ref 0.0–0.1)
EOS%: 1.8 % (ref 0.0–7.0)
Eosinophils Absolute: 0.2 10*3/uL (ref 0.0–0.5)
HCT: 40.6 % (ref 34.8–46.6)
HGB: 13.1 g/dL (ref 11.6–15.9)
LYMPH%: 27.7 % (ref 14.0–49.7)
MCH: 27.1 pg (ref 25.1–34.0)
MCHC: 32.3 g/dL (ref 31.5–36.0)
MCV: 83.8 fL (ref 79.5–101.0)
MONO#: 0.6 10*3/uL (ref 0.1–0.9)
MONO%: 6.5 % (ref 0.0–14.0)
NEUT#: 5.8 10*3/uL (ref 1.5–6.5)
NEUT%: 63.4 % (ref 38.4–76.8)
Platelets: 269 10*3/uL (ref 145–400)
RBC: 4.84 10*6/uL (ref 3.70–5.45)
RDW: 14.7 % — ABNORMAL HIGH (ref 11.2–14.5)
WBC: 9.2 10*3/uL (ref 3.9–10.3)
lymph#: 2.5 10*3/uL (ref 0.9–3.3)

## 2013-07-02 MED ORDER — TAMOXIFEN CITRATE 20 MG PO TABS: 20.0000 mg | ORAL_TABLET | Freq: Every day | ORAL | Status: DC

## 2013-07-02 NOTE — Telephone Encounter (Signed)
appts made and printed...td 

## 2013-07-08 ENCOUNTER — Encounter: Payer: Self-pay | Admitting: Oncology

## 2013-07-08 NOTE — Progress Notes (Signed)
OFFICE PROGRESS NOTE  CC  [name]., MD [address] [place] [address] [place] [address] Dr. Fanny Skates Dr. Lahoma Crocker Dr. Gery Pray  DIAGNOSIS: 53 year old female with history of bilateral breast cancers  PRIOR THERAPY:  #1She undergoes annual screening mammography. She had a screening mammogram in November, 2008 that showed calcifications in the left breast. Additional views were recommended, which took place on 05/02/2007. This showed a suspicious cluster of microcalcifications upper outer quadrant of left breast. Stereotactic biopsy was recommended. Biopsy on 05/27/2007 showed DCIS with calcifications, ER/PR positive at 100% respectively. A preoperative MRI scan performed on 06/04/2007 showed a 1.7 x 1.4 x 0.9 cm DCIS at 12 o'clock position left breast. No other abnormalities were seen. Ms. Fabry underwent a lumpectomy on 06/20/2007. Final pathology case number [id number] showed a 0.4-0.5 cm focus of invasive ductal cancer grade 1 of 3 with tubular features, clear surgical margins. Lymphovascular invasion was not seen. Associated DCIS measuring 0.4 cm was seen. In addition, the tumor was ER/PR positive at 77% and 96% respectively, proliferative index less than 5%, HER-2 was 0. She has had an unremarkable postoperative course  #2 status post left lumpectomy 06/20/2007 for a T1 A. invasive ductal carcinoma, grade 1, ER +77% PR +96% proliferation marker Ki-67 less than 5% HER-2/neu negative.  #3 status post adjuvant radiation therapy to the left breast completed 10/10/2007  #4 status post tamoxifen started September 2009 discontinued March 2012.  #5 status post right lumpectomy 05/26/2010 for ductal carcinoma in situ . She went on to complete radiation therapy 10/03/2010.  CURRENT THERAPY: Observation  INTERVAL HISTORY: Ashley Lindsey 53 y.o. female returns for  followup visit. Clinically she seems to be doing well. She does have intermittent left upper  extremity edema she also complains o breast pains off and on. Patient had many questions today for me. Including whether or not she should be on any kind of adjuvant antiestrogen therapy due to her breast cancer. Patient does have significant skeletal aches and pains. She has a history of what sounds like fibromyalgia. She is on Lyrica muscle relaxants and Ultram as needed for pain. She does see rheumatology. She denies having any nausea vomiting fevers chills or night sweats remainder of the 10 point review of systems is negative.   MEDICAL HISTORY: Past Medical History  Diagnosis Date  . Abdominal cramps   . Muscle spasms of head and/or neck     following to the breast  . Fatigue   . Passed out     4th of july  . Migraines   . Chronic headaches   . Arthritis   . Cancer     s/p radiation- last dose 6/12//has been off Tamoxifen 8/12  . Sleep apnea     STOP BANG SCORE 5  . Lymphedema     RIGHT ARM-////  STATES USE LEFT ARM FOR BP'S  . S/P radiation therapy 08/19/07 - 10/10/07    Left Breast/5040 cGy/28 fractions with Boost for a toatl dose of 6300 dGy  . S/P radiation therapy 08/14/10 -10/03/10    right breast  . Costochondritis   . Fibromyalgia 03/06/2013    ALLERGIES:  is allergic to aspirin and penicillins.  MEDICATIONS:  Current Outpatient Prescriptions  Medication Sig Dispense Refill  . ALPRAZolam (XANAX) 1 MG tablet Take 1 mg by mouth 3 (three) times daily as needed. anxiety      . butalbital-aspirin-caffeine (FIORINAL) 50-325-40 MG per capsule Take 1 capsule by mouth 3 (three) times  daily as needed. For headache      . cholecalciferol (VITAMIN D) 1000 UNITS tablet Take 2,000 Units by mouth 2 (two) times daily.      . cyclobenzaprine (FLEXERIL) 10 MG tablet Take 10 mg by mouth 3 (three) times daily as needed for muscle spasms.       . fish oil-omega-3 fatty acids 1000 MG capsule Take 1 capsule by mouth daily with breakfast.       . hydrochlorothiazide (HYDRODIURIL) 25 MG tablet  Take 25 mg by mouth at bedtime.       Marland Kitchen ibuprofen (ADVIL,MOTRIN) 800 MG tablet Take 800 mg by mouth every 8 (eight) hours as needed. For pain      . lisinopril (PRINIVIL,ZESTRIL) 10 MG tablet Take 10 mg by mouth daily.      Marland Kitchen omeprazole (PRILOSEC) 40 MG capsule       . predniSONE (DELTASONE) 5 MG tablet       . pregabalin (LYRICA) 75 MG capsule Take 1 capsule (75 mg total) by mouth 2 (two) times daily.  60 capsule  1  . senna (SENOKOT) 8.6 MG tablet Take 1 tablet by mouth every other day.       . traMADol (ULTRAM) 50 MG tablet Take 1 tablet (50 mg total) by mouth every 6 (six) hours as needed (pain).  60 tablet  1  . UNABLE TO FIND Rx: L8000-Post Surgical Bra (Quantity: 6) L2751- Silicone Breast Prosthesis (Quantity: 2) Dx: 174.9; Bilateral partial mastectomy Replacement due to hot flashes since previous items were too heavy and hot  1 each  0  . esomeprazole (NEXIUM) 40 MG capsule Take 40 mg by mouth daily before breakfast.      . tamoxifen (NOLVADEX) 20 MG tablet Take 1 tablet (20 mg total) by mouth daily.  90 tablet  12   No current facility-administered medications for this visit.    SURGICAL HISTORY:  Past Surgical History  Procedure Laterality Date  . Breast lumpectomy  2009/2012    LEFT/RIGHT with lymph node dissection  . Abdominal hysterectomy      with left salpingooophorectomy  . Knee arthroscopy      right  . Oophorectomy  2013    REVIEW OF SYSTEMS:  Pertinent items are noted in HPI.     PHYSICAL EXAMINATION: Blood pressure 127/89, pulse 98, temperature 98.1 F (36.7 C), temperature source Oral, resp. rate 20, height _0  (1.6 m), weight 245 lb 12.8 oz (111.494 kg). Body mass index is 43.55 kg/(m^2). ECOG PERFORMANCE STATUS: 2 - Symptomatic, <50% confined to bed   General appearance: alert, cooperative and appears stated age Lymph nodes: Cervical, supraclavicular, and axillary nodes normal. Resp: clear to auscultation bilaterally Back: symmetric, no curvature.  ROM normal. No CVA tenderness. Cardio: regular rate and rhythm GI: soft, non-tender; bowel sounds normal; no masses,  no organomegaly Extremities: extremities normal, atraumatic, no cyanosis or edema Neurologic: Grossly normal Breasts: breasts appear normal, no suspicious masses, no skin or nipple changes or axillary nodes, right breast normal without mass, skin or nipple changes or axillary nodes, left breast normal without mass, skin or nipple changes or axillary nodes.   LABORATORY DATA: Lab Results  Component Value Date   WBC 9.2 07/02/2013   HGB 13.1 07/02/2013   HCT 40.6 07/02/2013   MCV 83.8 07/02/2013   PLT 269 07/02/2013      Chemistry      Component Value Date/Time   NA 140 07/02/2013 1252   NA 137 01/06/2013 1835  K 4.0 07/02/2013 1252   K 4.0 01/06/2013 1835   CL 103 01/06/2013 1835   CL 105 12/26/2011 1538   CO2 23 07/02/2013 1252   CO2 27 01/06/2013 1835   BUN 14.2 07/02/2013 1252   BUN 12 01/06/2013 1835   CREATININE 0.7 07/02/2013 1252   CREATININE 0.66 01/06/2013 1835      Component Value Date/Time   CALCIUM 9.5 07/02/2013 1252   CALCIUM 8.4 01/06/2013 1835   ALKPHOS 98 07/02/2013 1252   ALKPHOS 94 01/06/2013 1835   AST 24 07/02/2013 1252   AST 16 01/06/2013 1835   ALT 31 07/02/2013 1252   ALT 20 01/06/2013 1835   BILITOT <0.20 07/02/2013 1252   BILITOT 0.1* 01/06/2013 1835       RADIOGRAPHIC STUDIES:  Mr Breast Bilateral W Wo Contrast  06/15/2013   CLINICAL DATA:  History of malignant lumpectomy of the left breast and left axillary sentinel lymph node biopsy in 2009. She has undergone postop radiation therapy. She notes bilateral breast pain.  EXAM: BILATERAL BREAST MRI WITH AND WITHOUT CONTRAST  LABS:  BUN and creatinine were obtained on site at [facility] at  [address]  Results:  BUN 10 mg/dL,  Creatinine 0.6 mg/dL.  TECHNIQUE: Multiplanar, multisequence MR images of both breasts were obtained prior to and following the intravenous administration of  46m of MultiHance  THREE-DIMENSIONAL MR IMAGE RENDERING ON INDEPENDENT WORKSTATION:  Three-dimensional MR images were rendered by post-processing of the original MR data on an independent workstation. The three-dimensional MR images were interpreted, and findings are reported in the following complete MRI report for this study. Three dimensional images were evaluated at the independent DynaCad workstation  COMPARISON:  Previous breast MRI dated 06/20/2009. Previous mammograms the most recent of which is dated 01/22/2013.  FINDINGS: Breast composition: c:  Heterogeneous fibroglandular tissue  Background parenchymal enhancement: Moderate  Right breast: No mass or abnormal enhancement.  Left breast: No mass or abnormal enhancement. There are scarring changes located centrally and superiorly within the left breast related to the patient's lumpectomy.  Lymph nodes: No abnormal appearing lymph nodes.  Ancillary findings:  None.  IMPRESSION: No findings worrisome for malignancy. No findings to account for the patient has bilateral breast pain.  RECOMMENDATION: Bilateral diagnostic mammography in October, 2015.  BI-RADS CATEGORY  1: Negative.   Electronically Signed   By: RLuberta RobertsonM.D.   On: 06/15/2013 08:13    ASSESSMENT/PLAN:  53year old female with  #1 history of bilateral breast cancers. First cancer was in 2009 of the left breast stage I that was ER positive PR positive HER-2/neu negative. She underwent a lumpectomy with sentinel lymph node biopsy followed by adjuvant radiation therapy and then tamoxifen until March 2012. Irritation subsequently developed a right breast cancer that was a ductal carcinoma in situ and this was in February 2012. She had a lumpectomy of the right breast on 08/11/2010. She underwent adjuvant radiation therapy which he completed on 10/03/2010. She is currently on observation alone. It is unclear why she was never put on antiestrogen therapy by Dr. RTruddie Coco  #2 patient and I  discussed the role of antiestrogen therapy seeing both invasive as well as noninvasive disease. We discussed risks benefits and side effects. Patient is very interested in going back on antiestrogen therapy as she is very concerned about recurrence in either of the breasts. I have therefore prescribed tamoxifen 20 mg to be taken on a daily basis. Thank of therapy would be 5-10  years. She understands the rationale for this.  #3 fibromyalgia/musculoskeletal pain: Patient will continue to be seen by a rheumatologist. She will continue to take your medications as prescribed I them. I have also recommended patient to exercise including possibility of doing yoga tai chi as well as water aerobics. I do think these activities will help with using of the pain.  #4 patient will be seen back in 3 months time for followup.    All questions were answered. The patient knows to call the clinic with any problems, questions or concerns. We can certainly see the patient much sooner if necessary.  I spent 25 minutes counseling the patient face to face. The total time spent in the appointment was 30 minutes.    Marcy Panning, MD Medical/Oncology [facility] [phone] (beeper) [phone] (Office)

## 2013-07-09 ENCOUNTER — Other Ambulatory Visit: Payer: Self-pay

## 2013-07-16 ENCOUNTER — Ambulatory Visit: Payer: Self-pay | Admitting: Physician Assistant

## 2013-07-28 NOTE — Progress Notes (Signed)
Fax received from [facility] dtd 07/28/13 discharging patient.  Provided to [facility].

## 2013-08-03 ENCOUNTER — Telehealth: Payer: Self-pay

## 2013-08-03 NOTE — Telephone Encounter (Signed)
Returned pt call re: muscle spasms in breast and arms.  LMOVM requesting call back.

## 2013-08-04 ENCOUNTER — Telehealth: Payer: Self-pay | Admitting: *Deleted

## 2013-08-04 NOTE — Telephone Encounter (Signed)
Called patient with instructions from Burns Spain NP.  Verbalized understanding that this is muscular and when cough resolves, the spasms should return to her normal.

## 2013-08-04 NOTE — Telephone Encounter (Signed)
   Provider input needed: "Advice on muscle spasms, pain to breasts"   Reason for call: Concerned about re-occurrence, should I take my medicines, do I need an appointment  Integument/breast: positive for muscle spasms, pain   ALLERGIES:  is allergic to aspirin and penicillins.  Patient last received chemotherapy/ treatment on N/A on tamoxifen  Patient was last seen in the office on 07-02-2013  Next appt is 10-29-2013  Is patient having fevers greater than 100.5?  yes, "Temp = 99.1, saw PCP, have a sinus infection and started levaquin    Is patient having uncontrolled pain, or new pain? no, "I have fibromyalgia and always had spasms to breast.  Spasms are more severe.  Changed a week ago when I started with this cough.  I am taking cough medicine.  I hurt from the front of my breast to my shoulder blades."   Is patient having new back pain that changes with position (worsens or eases when laying down?)  yes, "my breast hurt more with movement.  I am using a heating pad to my breast which helps."   Is patient able to eat and drink? yes    Is patient able to pass stool without difficulty?   yes     Is patient having uncontrolled nausea?  no    patient calls 08/04/2013 with complaint of  Integument/breast: positive for spasms, I was diagnosed with fibromyalgia and referred to endocrinologist who is no longer in practice.  Do I see Dr. Humphrey Rolls?  Do I continue just taking my flexeril, ibuprofen 800 mg and tamoxifen?  Is this normal or do I have a breast cancer reoccurrence?     Summary Based on the above information advised patient to continue levaquin and cough medicine, I will notify providers and call [phone] with any instructions or orders received.   Ashley Lindsey  08/04/2013, 1:57 PM   Background Info  Ashley Lindsey   DOB: October 14, 1960   MR#: [id number]   CSN#   [id number] 08/04/2013

## 2013-08-04 NOTE — Telephone Encounter (Signed)
Spasms likely due to cough and upper respiratory infection. Highly unlikely it represents recurrence.  Reassure patient this is most likely muscular in origin - continue Flexeril and Ibuprofen. Does not need to be seen.  Spasms should resolve when cough ends (or go back to her baseline).  Burns Spain AOCNP, NP-C  Pt had MRI in Feb 2015 negative for any areas of suspicion.  CLINICAL DATA: History of malignant lumpectomy of the left breast  and left axillary sentinel lymph node biopsy in 2009. She has  undergone postop radiation therapy. She notes bilateral breast pain.  EXAM:  BILATERAL BREAST MRI WITH AND WITHOUT CONTRAST  LABS: BUN and creatinine were obtained on site at [facility] at  [address]  Results: BUN 10 mg/dL, Creatinine 0.6 mg/dL.  TECHNIQUE:  Multiplanar, multisequence MR images of both breasts were obtained  prior to and following the intravenous administration of 75ml of  MultiHance  THREE-DIMENSIONAL MR IMAGE RENDERING ON INDEPENDENT WORKSTATION:  Three-dimensional MR images were rendered by post-processing of the  original MR data on an independent workstation. The  three-dimensional MR images were interpreted, and findings are  reported in the following complete MRI report for this study. Three  dimensional images were evaluated at the independent DynaCad  workstation  COMPARISON: Previous breast MRI dated 06/20/2009. Previous  mammograms the most recent of which is dated 01/22/2013.  FINDINGS:  Breast composition: c: Heterogeneous fibroglandular tissue  Background parenchymal enhancement: Moderate  Right breast: No mass or abnormal enhancement.  Left breast: No mass or abnormal enhancement. There are scarring  changes located centrally and superiorly within the left breast  related to the patient's lumpectomy.  Lymph nodes: No abnormal appearing lymph nodes.  Ancillary findings: None.  IMPRESSION:  No findings worrisome for malignancy. No  findings to account for the  patient has bilateral breast pain.  RECOMMENDATION:  Bilateral diagnostic mammography in October, 2015.  BI-RADS CATEGORY 1: Negative.  Electronically Signed  By: Luberta Robertson M.D.  On: 06/15/2013 08:13

## 2013-08-21 ENCOUNTER — Telehealth: Payer: Self-pay | Admitting: *Deleted

## 2013-08-21 NOTE — Telephone Encounter (Signed)
Received call from patient stating she is having some breast pain down into her nipple and feels a lump.  Offered appointment for today with Dr. Earnest Conroy and she wants to wait till Monday.  Confirmed appointment with Charlestine Massed.NP for 08/24/13 at 115pm.

## 2013-08-24 ENCOUNTER — Ambulatory Visit: Payer: Medicare HMO | Admitting: Adult Health

## 2013-08-24 ENCOUNTER — Telehealth: Payer: Self-pay | Admitting: *Deleted

## 2013-08-24 ENCOUNTER — Other Ambulatory Visit (HOSPITAL_COMMUNITY): Payer: Self-pay | Admitting: Rheumatology

## 2013-08-24 DIAGNOSIS — M2550 Pain in unspecified joint: Secondary | ICD-10-CM

## 2013-08-24 NOTE — Telephone Encounter (Signed)
Called to inquire about appt today. Pt did not show. Pt said she simply  forgot about appt. I reminded her this was a f/u appt with the NP and will try to r/s her. Pt went to see Dr. Estanislado Pandy. She was Rx Prednisone and Plaquenil. She is currently taking Tamoxifen. Pt still complains of pain around right nipple area and joint pain. Message to be forwarded to Charlestine Massed, NP.

## 2013-08-27 ENCOUNTER — Encounter (HOSPITAL_COMMUNITY): Payer: Medicare HMO

## 2013-08-27 ENCOUNTER — Encounter (HOSPITAL_COMMUNITY): Admission: RE | Admit: 2013-08-27 | Payer: Medicare HMO | Source: Ambulatory Visit

## 2013-08-28 ENCOUNTER — Telehealth: Payer: Self-pay

## 2013-08-28 NOTE — Telephone Encounter (Signed)
Returned pt call.  She has been seeing an rheumatologist that wants her to have a bone density scan.  She wants to know if it is safe for her to have the scan because of her previous cancer.  She also reports being put on hydroxychloroquine 200mg  BID last week.  She has been having tenderness in breasts and nipples and believes it is related to the med.  She wants to know if she should be seen.  Pt denies lumps, discharge.  Advised patient she should contact her rheumatologist to determine if this is a side effect of the medication.  Also advised the patient that I did not believe her rheumatologist would have ordered the scan if she thought it was dangerous, but that I would be happy to print her previous scans out and send them to her so she can provide them to her rheumatologist to review and determine if she needs another scan.  Pt voiced understanding.    Patient states she will call LC to see if she will see her.  Routed to [facility]

## 2013-09-07 ENCOUNTER — Ambulatory Visit (HOSPITAL_COMMUNITY)
Admission: RE | Admit: 2013-09-07 | Discharge: 2013-09-07 | Disposition: A | Discharge status: Home / Self Care | Payer: Medicare HMO | Source: Ambulatory Visit | Attending: Rheumatology | Admitting: Rheumatology

## 2013-09-07 ENCOUNTER — Encounter (HOSPITAL_COMMUNITY)
Admission: RE | Admit: 2013-09-07 | Discharge: 2013-09-07 | Disposition: A | Discharge status: Home / Self Care | Payer: Medicare HMO | Source: Ambulatory Visit | Attending: Rheumatology | Admitting: Rheumatology

## 2013-09-07 DIAGNOSIS — M2550 Pain in unspecified joint: Secondary | ICD-10-CM

## 2013-09-07 DIAGNOSIS — M25569 Pain in unspecified knee: Secondary | ICD-10-CM | POA: Insufficient documentation

## 2013-09-07 DIAGNOSIS — M259 Joint disorder, unspecified: Secondary | ICD-10-CM | POA: Insufficient documentation

## 2013-09-07 DIAGNOSIS — C50919 Malignant neoplasm of unspecified site of unspecified female breast: Secondary | ICD-10-CM | POA: Insufficient documentation

## 2013-09-07 MED ORDER — TECHNETIUM TC 99M MEDRONATE IV KIT
25.0000 | PACK | Freq: Once | INTRAVENOUS | Status: AC | PRN
  Administered 2013-09-07: 25 via INTRAVENOUS

## 2013-10-19 ENCOUNTER — Telehealth: Payer: Self-pay | Admitting: Hematology and Oncology

## 2013-10-19 NOTE — Telephone Encounter (Signed)
, °

## 2013-10-22 ENCOUNTER — Other Ambulatory Visit: Payer: Medicare HMO

## 2013-10-22 ENCOUNTER — Ambulatory Visit: Payer: Medicare HMO | Admitting: Oncology

## 2013-10-29 ENCOUNTER — Other Ambulatory Visit: Payer: Medicare HMO

## 2013-10-29 ENCOUNTER — Ambulatory Visit: Payer: Medicare HMO | Admitting: Oncology

## 2013-11-11 ENCOUNTER — Telehealth: Payer: Self-pay | Admitting: *Deleted

## 2013-11-11 NOTE — Telephone Encounter (Signed)
Received call from patient stating she has been having low back pain that moves up to her breasts.  She states she has seen a rheumatologist for fibromyalgia but the pain has never been in her breasts.  Informed her she could see Dr. Lona Kettle but also instructed her to follow up with her rheumatologist who has also suggested she see a neurologist.  Confirmed appointment for 11/12/13 at 4pm.

## 2013-11-12 ENCOUNTER — Ambulatory Visit: Payer: Commercial Managed Care - HMO

## 2013-11-19 ENCOUNTER — Telehealth: Payer: Self-pay | Admitting: *Deleted

## 2013-11-19 NOTE — Telephone Encounter (Signed)
   Provider input needed: "pain across chest at breast area"   Reason for call: abnormal pain across breast area worsening the last three days.  Going bra-less but pain not improved.  Take ibuprofen 800 mg but I can't keep taking this because it's hard on my stomach.  Musculoskeletal:positive for "Pain across chest at breast line that radiates to back of breast worse t he past few days.  I have fibromyalgia, was started on prednisone in June.  Is the medicine causing the pain?"    ALLERGIES:  is allergic to aspirin and penicillins.  Patient last received chemotherapy/ treatment on n/a  Patient was last seen in the office on 07-02-2013 Dr. Humphrey Rolls  Next appt is 01-25-2014 Dr. Lindi Adie  Is patient having fevers greater than 100.5?  no   Is patient having uncontrolled pain, or new pain? no, "but pain worse the past three days"   Is patient having new back pain that changes with position (worsens or eases when laying down?)  no   Is patient able to eat and drink? yes    Is patient able to pass stool without difficulty?   yes     Is patient having uncontrolled nausea?  no    patient calls 11/19/2013 with complaint of  Musculoskeletal:positive for back pain and radiating from chest at breast area   Summary Based on the above information advised patient to  Contact Dr. Estanislado Pandy who prescribed the prednisone and manages her fibromyalgia and this nurse will notify an APP.  Asked for a return call to [phone].  Home number is [phone].   Winston-Spruiell, Ashley Lindsey  11/19/2013, 3:42 PM   Background Info  Ashley Lindsey   DOB: 1960/10/05   MR#: [id number]   CSN#   [id number] 11/19/2013

## 2013-11-19 NOTE — Telephone Encounter (Signed)
Verbal order received and read back from APP Selena Lesser NP for patient to go to ER.  After leaving message on voicemail was able to speak with patient on home phone.  Instructions given.  States "but it's just my breast".  Expressed that she needs to go to ER to rule out other possibilities.

## 2013-12-07 ENCOUNTER — Telehealth: Payer: Self-pay | Admitting: *Deleted

## 2013-12-07 NOTE — Telephone Encounter (Signed)
Received call from patient stating she has been having some pain radiating to her breast.  She states she does get fibromyalgia flare ups and she sees Dr. Ceasar Lund for this.  She was a no show for Dr. Lona Kettle in July for similar complaints.  She also states she is getting an MRI she was not sure of what.  Informed her that Dr. Lindi Adie could see her sometime this week and to call me back and let me know what she would like to do and what kind of an MRI is she getting.  Patient verbalized understanding and states she will call me in the morning.

## 2014-01-22 ENCOUNTER — Other Ambulatory Visit: Payer: Self-pay

## 2014-01-22 DIAGNOSIS — C50919 Malignant neoplasm of unspecified site of unspecified female breast: Secondary | ICD-10-CM

## 2014-01-25 ENCOUNTER — Other Ambulatory Visit: Payer: Medicare HMO

## 2014-01-25 ENCOUNTER — Ambulatory Visit: Payer: Medicare HMO | Admitting: Hematology and Oncology

## 2014-01-25 ENCOUNTER — Other Ambulatory Visit: Payer: Self-pay

## 2014-01-26 ENCOUNTER — Telehealth: Payer: Self-pay | Admitting: Hematology and Oncology

## 2014-01-26 NOTE — Telephone Encounter (Signed)
, °

## 2014-02-08 ENCOUNTER — Telehealth: Payer: Self-pay | Admitting: Hematology and Oncology

## 2014-02-08 NOTE — Telephone Encounter (Signed)
Pt r/s appt from 02/16/14 to 02/11/14.

## 2014-02-11 ENCOUNTER — Other Ambulatory Visit (HOSPITAL_BASED_OUTPATIENT_CLINIC_OR_DEPARTMENT_OTHER): Payer: Medicare HMO

## 2014-02-11 ENCOUNTER — Ambulatory Visit (HOSPITAL_BASED_OUTPATIENT_CLINIC_OR_DEPARTMENT_OTHER): Payer: Medicare HMO | Admitting: Hematology and Oncology

## 2014-02-11 VITALS — BP 133/87 | HR 106 | Temp 98.0°F | Resp 19 | Ht 63.0 in | Wt 268.3 lb

## 2014-02-11 DIAGNOSIS — C50919 Malignant neoplasm of unspecified site of unspecified female breast: Secondary | ICD-10-CM

## 2014-02-11 DIAGNOSIS — C50212 Malignant neoplasm of upper-inner quadrant of left female breast: Secondary | ICD-10-CM

## 2014-02-11 DIAGNOSIS — Z853 Personal history of malignant neoplasm of breast: Secondary | ICD-10-CM

## 2014-02-11 DIAGNOSIS — Z170 Estrogen receptor positive status [ER+]: Secondary | ICD-10-CM

## 2014-02-11 LAB — CBC WITH DIFFERENTIAL/PLATELET
BASO%: 0.1 % (ref 0.0–2.0)
Basophils Absolute: 0 10*3/uL (ref 0.0–0.1)
EOS%: 0.7 % (ref 0.0–7.0)
Eosinophils Absolute: 0.1 10*3/uL (ref 0.0–0.5)
HCT: 40.7 % (ref 34.8–46.6)
HGB: 12.6 g/dL (ref 11.6–15.9)
LYMPH%: 18.3 % (ref 14.0–49.7)
MCH: 27 pg (ref 25.1–34.0)
MCHC: 31 g/dL — ABNORMAL LOW (ref 31.5–36.0)
MCV: 87.2 fL (ref 79.5–101.0)
MONO#: 0.7 10*3/uL (ref 0.1–0.9)
MONO%: 4.9 % (ref 0.0–14.0)
NEUT#: 11.3 10*3/uL — ABNORMAL HIGH (ref 1.5–6.5)
NEUT%: 76 % (ref 38.4–76.8)
Platelets: 269 10*3/uL (ref 145–400)
RBC: 4.67 10*6/uL (ref 3.70–5.45)
RDW: 13.7 % (ref 11.2–14.5)
WBC: 14.8 10*3/uL — ABNORMAL HIGH (ref 3.9–10.3)
lymph#: 2.7 10*3/uL (ref 0.9–3.3)

## 2014-02-11 NOTE — Progress Notes (Signed)
Patient Care Team: Willey Blade, MD as PCP - General (Internal Medicine)  DIAGNOSIS: Breast cancer of upper-inner quadrant of left female breast   Primary site: Breast (Left)   Staging method: AJCC 7th Edition   Clinical: (T1a, N0, cM0)   Pathologic: Stage IA (T1a, N0, cM0) signed by Rulon Eisenmenger, MD on 02/11/2014  5:59 PM   Summary: Stage IA (T1a, N0, cM0)  PRIOR THERAPY:  #1 screening mammogram in November, 2008 that showed calcifications in the left breast. Additional views were recommended, which took place on 05/02/2007. This showed a suspicious cluster of microcalcifications upper outer quadrant of left breast. Stereotactic biopsy was recommended. Biopsy on 05/27/2007 showed DCIS with calcifications, ER/PR positive at 100% respectively. A preoperative MRI scan performed on 06/04/2007 showed a 1.7 x 1.4 x 0.9 cm DCIS at 12 o'clock position left breast.  #2 status post left lumpectomy 06/20/2007 for a T1 A. 0.4-0.5 cm invasive ductal carcinoma, grade 1, ER +77% PR +96% proliferation marker Ki-67 less than 5% HER-2/neu negative.   #3 status post adjuvant radiation therapy to the left breast completed 10/10/2007   #4 status post tamoxifen started September 2009 discontinued March 2012.   #5 status post right lumpectomy 05/26/2010 for ductal carcinoma in situ . She went on to complete radiation therapy 10/03/2010.   CURRENT THERAPY: Tamoxifen until July 2017  CHIEF COMPLIANT: Followup on tamoxifen; complains of tenderness in the breasts  INTERVAL HISTORY: Ashley Lindsey is a 53 year old of mitral lady with above-mentioned history of bilateral breast cancers she is complaining of severe pain in both her breasts. Her last screening evaluation was MRI that was done in February 2015. She reports that she is completely unable to do any mammograms because of the severe tenderness in the breast. She has a history of fibromyalgia for which she is on multiple medications including prednisone  but she is trying to get off. She reports that she had gained almost 70 pounds because of prednisone.   REVIEW OF SYSTEMS:   Constitutional: Diffuse body pains Eyes: Denies blurriness of vision Ears, nose, mouth, throat, and face: Denies mucositis or sore throat Respiratory: Denies cough, dyspnea or wheezes Cardiovascular: Denies palpitation, chest discomfort or lower extremity swelling Gastrointestinal:  Denies nausea, heartburn or change in bowel habits Skin: Denies abnormal skin rashes Lymphatics: Denies new lymphadenopathy or easy bruising Neurological:Denies numbness, tingling or new weaknesses Behavioral/Psych: Mood is stable, no new changes  Breast: Complains of severe pain in both her breasts chronically All other systems were reviewed with the patient and are negative.  I have reviewed the past medical history, past surgical history, social history and family history with the patient and they are unchanged from previous note.  ALLERGIES:  is allergic to aspirin and penicillins.  MEDICATIONS:  Current Outpatient Prescriptions  Medication Sig Dispense Refill  . ALPRAZolam (XANAX) 1 MG tablet Take 1 mg by mouth 3 (three) times daily as needed. anxiety      . butalbital-aspirin-caffeine (FIORINAL) 50-325-40 MG per capsule Take 1 capsule by mouth 3 (three) times daily as needed. For headache      . cholecalciferol (VITAMIN D) 1000 UNITS tablet Take 2,000 Units by mouth 2 (two) times daily.      . cyclobenzaprine (FLEXERIL) 10 MG tablet Take 10 mg by mouth 3 (three) times daily as needed for muscle spasms.       . fish oil-omega-3 fatty acids 1000 MG capsule Take 1 capsule by mouth daily with breakfast.       .  hydrochlorothiazide (HYDRODIURIL) 25 MG tablet Take 25 mg by mouth at bedtime.       Marland Kitchen ibuprofen (ADVIL,MOTRIN) 800 MG tablet Take 800 mg by mouth every 8 (eight) hours as needed. For pain      . lisinopril (PRINIVIL,ZESTRIL) 10 MG tablet Take 10 mg by mouth daily.      Marland Kitchen  omeprazole (PRILOSEC) 40 MG capsule       . predniSONE (DELTASONE) 5 MG tablet       . pregabalin (LYRICA) 75 MG capsule Take 1 capsule (75 mg total) by mouth 2 (two) times daily.  60 capsule  1  . senna (SENOKOT) 8.6 MG tablet Take 1 tablet by mouth every other day.       . traMADol (ULTRAM) 50 MG tablet Take 1 tablet (50 mg total) by mouth every 6 (six) hours as needed (pain).  60 tablet  1  . UNABLE TO FIND Rx: L8000-Post Surgical Bra (Quantity: 6) W1191- Silicone Breast Prosthesis (Quantity: 2) Dx: 174.9; Bilateral partial mastectomy Replacement due to hot flashes since previous items were too heavy and hot  1 each  0   No current facility-administered medications for this visit.    PHYSICAL EXAMINATION: ECOG PERFORMANCE STATUS: 1 - Symptomatic but completely ambulatory  Filed Vitals:   02/11/14 1504  BP: 133/87  Pulse: 106  Temp: 98 F (36.7 C)  Resp: 19   Filed Weights   02/11/14 1504  Weight: 268 lb 4.8 oz (121.7 kg)    GENERAL:alert, no distress and comfortable SKIN: skin color, texture, turgor are normal, no rashes or significant lesions EYES: normal, Conjunctiva are pink and non-injected, sclera clear OROPHARYNX:no exudate, no erythema and lips, buccal mucosa, and tongue normal  NECK: supple, thyroid normal size, non-tender, without nodularity LYMPH:  no palpable lymphadenopathy in the cervical, axillary or inguinal LUNGS: clear to auscultation and percussion with normal breathing effort HEART: regular rate & rhythm and no murmurs and no lower extremity edema ABDOMEN:abdomen soft, non-tender and normal bowel sounds Musculoskeletal:no cyanosis of digits and no clubbing  NEURO: alert & oriented x 3 with fluent speech, no focal motor/sensory deficits BREAST: No palpable masses or nodules in either right or left breasts.  Breasts are tender to palpation.No palpable axillary supraclavicular or infraclavicular adenopathy no breast tenderness or nipple  discharge.   LABORATORY DATA:  I have reviewed the data as listed   Chemistry      Component Value Date/Time   NA 140 07/02/2013 1252   NA 137 01/06/2013 1835   K 4.0 07/02/2013 1252   K 4.0 01/06/2013 1835   CL 103 01/06/2013 1835   CL 105 12/26/2011 1538   CO2 23 07/02/2013 1252   CO2 27 01/06/2013 1835   BUN 14.2 07/02/2013 1252   BUN 12 01/06/2013 1835   CREATININE 0.7 07/02/2013 1252   CREATININE 0.66 01/06/2013 1835      Component Value Date/Time   CALCIUM 9.5 07/02/2013 1252   CALCIUM 8.4 01/06/2013 1835   ALKPHOS 98 07/02/2013 1252   ALKPHOS 94 01/06/2013 1835   AST 24 07/02/2013 1252   AST 16 01/06/2013 1835   ALT 31 07/02/2013 1252   ALT 20 01/06/2013 1835   BILITOT <0.20 07/02/2013 1252   BILITOT 0.1* 01/06/2013 1835       Lab Results  Component Value Date   WBC 14.8* 02/11/2014   HGB 12.6 02/11/2014   HCT 40.7 02/11/2014   MCV 87.2 02/11/2014   PLT 269 02/11/2014   NEUTROABS  11.3* 02/11/2014    ASSESSMENT & PLAN:  Breast cancer of upper-inner quadrant of left female breast Left breast invasive ductal carcinoma grade 1 ER 77% PR 96% gases on 5% HER-2 negative, T1 AN0M0 stage I A. status post lumpectomy 2.7 2009 followed by radiation therapy completed 10/10/2007, tamoxifen started September 2009 discontinued March 2012 followed by recurrence in the right breast as DCIS 05/26/2010 completed another lumpectomy and radiation therapy and she has been on tamoxifen ever since.  Tamoxifen counseling: Patient is tolerating it very well without any major problems. Recommended continuing tamoxifen for at least 5 years from 2012 recurrence. So she will complete tamoxifen therapy in July 2017.  Surveillance: Today's breast exam was completely normal. Recommended continued annual followup and surveillance. Because she has extremely tender breasts, I am recommending that she undergo MRI surveillance in February of every year. Patient also has dense breasts.  Return to clinic in March 2015  to followup on MRI results  fibromyalgia: I believe that is the reason for her breast tenderness. There is no palpable nodularity.  No orders of the defined types were placed in this encounter.   The patient has a good understanding of the overall plan. she agrees with it. She will call with any problems that may develop before her next visit here.  I spent 25 minutes counseling the patient face to face. The total time spent in the appointment was 30 minutes and more than 50% was on counseling and review of test results    Rulon Eisenmenger, MD 02/11/2014 6:01 PM

## 2014-02-11 NOTE — Assessment & Plan Note (Signed)
Left breast invasive ductal carcinoma grade 1 ER 77% PR 96% gases on 5% HER-2 negative, T1 AN0M0 stage I A. status post lumpectomy 2.7 2009 followed by radiation therapy completed 10/10/2007, tamoxifen started September 2009 discontinued March 2012 followed by recurrence in the right breast as DCIS 05/26/2010 completed another lumpectomy and radiation therapy and she has been on tamoxifen ever since.  Tamoxifen counseling: Patient is tolerating it very well without any major problems. Recommended continuing tamoxifen for at least 5 years from 2012 recurrence. So she will complete tamoxifen therapy in July 2017.  Surveillance: Today's breast exam was completely normal. Recommended continued annual followup and surveillance. Because she has extremely tender breasts, I am recommending that she undergo MRI surveillance in February of every year. Patient also has dense breasts.  Return to clinic in March 2015 to followup on MRI results

## 2014-02-12 LAB — COMPREHENSIVE METABOLIC PANEL (CC13)
ALT: 23 U/L (ref 0–55)
AST: 17 U/L (ref 5–34)
Albumin: 3.3 g/dL — ABNORMAL LOW (ref 3.5–5.0)
Alkaline Phosphatase: 79 U/L (ref 40–150)
Anion Gap: 9 mEq/L (ref 3–11)
BUN: 14.3 mg/dL (ref 7.0–26.0)
CO2: 28 mEq/L (ref 22–29)
Calcium: 9.5 mg/dL (ref 8.4–10.4)
Chloride: 105 mEq/L (ref 98–109)
Creatinine: 0.8 mg/dL (ref 0.6–1.1)
Glucose: 105 mg/dl (ref 70–140)
Potassium: 3.6 mEq/L (ref 3.5–5.1)
Sodium: 142 mEq/L (ref 136–145)
Total Bilirubin: <0.2 mg/dL (ref 0.20–1.20)
Total Protein: 6.7 g/dL (ref 6.4–8.3)

## 2014-02-12 NOTE — Addendum Note (Signed)
Addended by: Prentiss Bells on: 02/12/2014 05:13 PM   Modules accepted: Orders

## 2014-02-15 ENCOUNTER — Other Ambulatory Visit: Payer: Self-pay | Admitting: Hematology and Oncology

## 2014-02-15 ENCOUNTER — Telehealth: Payer: Self-pay | Admitting: Hematology and Oncology

## 2014-02-15 DIAGNOSIS — Z853 Personal history of malignant neoplasm of breast: Secondary | ICD-10-CM

## 2014-02-15 NOTE — Telephone Encounter (Signed)
Confirm appt for [place] Nov 4. Mailed cal for Nov, Feb, March appts.

## 2014-02-16 ENCOUNTER — Other Ambulatory Visit: Payer: Self-pay | Admitting: Hematology and Oncology

## 2014-02-16 ENCOUNTER — Ambulatory Visit: Payer: Medicare HMO | Admitting: Hematology and Oncology

## 2014-02-16 ENCOUNTER — Other Ambulatory Visit: Payer: Medicare HMO

## 2014-02-16 DIAGNOSIS — C50212 Malignant neoplasm of upper-inner quadrant of left female breast: Secondary | ICD-10-CM

## 2014-02-18 ENCOUNTER — Other Ambulatory Visit: Payer: Self-pay | Admitting: Hematology and Oncology

## 2014-02-18 ENCOUNTER — Other Ambulatory Visit: Payer: Self-pay

## 2014-02-18 DIAGNOSIS — Z853 Personal history of malignant neoplasm of breast: Secondary | ICD-10-CM

## 2014-02-22 ENCOUNTER — Telehealth: Payer: Self-pay | Admitting: *Deleted

## 2014-02-22 ENCOUNTER — Other Ambulatory Visit: Payer: Self-pay | Admitting: *Deleted

## 2014-02-22 NOTE — Telephone Encounter (Signed)
Patient called wanting to know if she needed to schedule her MRI. Let patient know that MRI would be scheduled for her and they would let her know when appt was. Patient verbalized understanding.

## 2014-04-06 ENCOUNTER — Ambulatory Visit: Payer: Commercial Managed Care - HMO | Attending: Orthopaedic Surgery | Admitting: Physical Therapy

## 2014-04-06 ENCOUNTER — Telehealth: Payer: Self-pay | Admitting: *Deleted

## 2014-04-06 DIAGNOSIS — M25562 Pain in left knee: Secondary | ICD-10-CM | POA: Diagnosis not present

## 2014-04-06 DIAGNOSIS — Z9889 Other specified postprocedural states: Secondary | ICD-10-CM | POA: Diagnosis present

## 2014-04-06 DIAGNOSIS — M25662 Stiffness of left knee, not elsewhere classified: Secondary | ICD-10-CM | POA: Diagnosis not present

## 2014-04-06 DIAGNOSIS — R531 Weakness: Secondary | ICD-10-CM | POA: Diagnosis not present

## 2014-04-06 NOTE — Patient Instructions (Signed)
Heel Slide   Bend left knee and pull heel toward buttocks. Use strap around foot and pull strap with arms to assist knee to bend further. Hold 10 secs.  Repeat _10___ times. Do _2-3___ sessions per day.  Ankle Pump   With left leg elevated, gently flex and extend ankle. Move through full range of motion. Avoid pain. Repeat __10__ times per set. Do _1__ sets per session. Do _2-3___ sessions per day.  http://orth.exer.us/33   Copyright  VHI. All rights reserved.   Quad Set   Slowly tighten muscles on thigh of straight leg while counting out loud to _5___.  Repeat __10__ times. Do __2-3__ sessions per day.  http://gt2.exer.us/362   Copyright  VHI. All rights reserved.   Abduction   Slide one leg out to side. Keep knee cap pointing up. Gently bring leg back to the middle. Repeat _10___ times. Do __2-3__ sessions per day.  http://gt2.exer.us/374   Copyright  VHI. All rights reserved.    Laureen Abrahams, PT, DPT 04/06/2014 5:04 PM  [facility] [address], [place] [address]  [phone] (office) [phone] (fax)

## 2014-04-06 NOTE — Therapy (Signed)
Outpatient Rehabilitation [facility] [address] [place], [place], [address] Phone: [phone]   Fax:  [phone]  Physical Therapy Evaluation  Patient Details  Name: Ashley Lindsey MRN: [id number] Date of Birth: 1960/11/27  Encounter Date: 04/06/2014      PT End of Session - 04/06/14 1717    Visit Number 1   Number of Visits 16   Date for PT Re-Evaluation 06/05/14   PT Start Time 1630   PT Stop Time 1708   PT Time Calculation (min) 38 min   Activity Tolerance Patient tolerated treatment well   Behavior During Therapy [facility] for tasks assessed/performed      Past Medical History  Diagnosis Date  . Abdominal cramps   . Muscle spasms of head and/or neck     following to the breast  . Fatigue   . Passed out     4th of july  . Migraines   . Chronic headaches   . Arthritis   . Cancer     s/p radiation- last dose 6/12//has been off Tamoxifen 8/12  . Sleep apnea     STOP BANG SCORE 5  . Lymphedema     RIGHT ARM-////  STATES USE LEFT ARM FOR BP'S  . S/P radiation therapy 08/19/07 - 10/10/07    Left Breast/5040 cGy/28 fractions with Boost for a toatl dose of 6300 dGy  . S/P radiation therapy 08/14/10 -10/03/10    right breast  . Costochondritis   . Fibromyalgia 03/06/2013    Past Surgical History  Procedure Laterality Date  . Breast lumpectomy  2009/2012    LEFT/RIGHT with lymph node dissection  . Abdominal hysterectomy      with left salpingooophorectomy  . Knee arthroscopy      right  . Oophorectomy  2013    There were no vitals taken for this visit.  Visit Diagnosis:  S/P left knee arthroscopy - Plan: PT plan of care cert/re-cert  Knee stiffness, left - Plan: PT plan of care cert/re-cert  Left knee pain - Plan: PT plan of care cert/re-cert  Generalized weakness - Plan: PT plan of care cert/re-cert      Subjective Assessment - 04/06/14 1634    Symptoms Pt is a 53 y/o female who presents to [place] s/p L knee scope 03/11/14.  Pt. reports  surgery revealed torn cartilage and fluid drawn from posterior knee.  Pt presents today with difficulty ambulating, pain and difficulty with ADL.   Pertinent History fibromyalgia, active breast cancer x 2, HTN   Limitations Sitting;Standing;Walking   How long can you sit comfortably? 1 hour   How long can you stand comfortably? 10-20 min   How long can you walk comfortably? 5-10 min   Diagnostic tests bil knee OA   Patient Stated Goals return to water aerobics, jazzercise, decrease pain   Currently in Pain? Yes   Pain Score 8    Pain Location Knee   Pain Orientation Left   Pain Descriptors / Indicators Stabbing;Aching;Dull  grinding   Pain Type Surgical pain;Chronic pain   Pain Onset More than a month ago   Pain Frequency Constant   Aggravating Factors  standing, use of L knee   Pain Relieving Factors ice, muscle relaxors, medication          [facility] PT Assessment - 04/06/14 1642    Assessment   Medical Diagnosis L knee arthroscopy   Onset Date 03/11/14   Next MD Visit 05/05/14   Prior Therapy n/a   Precautions  Precautions Fall   Restrictions   Weight Bearing Restrictions No   Balance Screen   Has the patient fallen in the past 6 months Yes   How many times? 2  middle of night following surgery   Has the patient had a decrease in activity level because of a fear of falling?  No   Is the patient reluctant to leave their home because of a fear of falling?  No   Home Environment   Living Enviornment Private residence   Transport planner;Children;Other relatives   Available Help at Discharge Available 24 hours/day;Family   Type of [place] to enter   Entrance Stairs-Number of Steps 6   Entrance Stairs-Rails Right   Home Layout One level   [place] - 2 wheels   Prior Function   Level of Independence Independent with basic ADLs;Requires assistive device for independence;Independent with transfers   Vocation On  disability   Leisure water aerobics, exercise at fitness center   Cognition   Overall Cognitive Status Within Functional Limits for tasks assessed   Observation/Other Assessments   Focus on Therapeutic Outcomes (FOTO)  FOTO 18 (82% limited; predicted 52% limited)   AROM   Left Knee Extension 16   Left Knee Flexion 88   PROM   Left Knee Extension 8   Left Knee Flexion 100   Strength   Overall Strength Comments suspect additional bil hip weakness however unable to fully test due to pt unable to tolerate testing positions   Right Hip Flexion 3+/5   Left Hip Flexion 3-/5   Right Knee Flexion 4/5   Right Knee Extension 3+/5   Left Knee Flexion 3/5   Left Knee Extension 3-/5   Special Tests    Special Tests Swelling Tests   Swelling Tests other   other   Comments Circumferential measurements superior pole of patella: R 47.7 cm; L 50.8 cm   Ambulation/Gait   Ambulation/Gait Yes   Ambulation/Gait Assistance 5: Supervision   Ambulation Distance (Feet) 75 Feet   Assistive device Large base quad cane   Gait Pattern Decreased stance time - right;Decreased hip/knee flexion - right;Right circumduction;Antalgic   Gait velocity 0.85 ft/sec          [facility] Adult PT Treatment/Exercise - 04/06/14 1642    Knee/Hip Exercises: Supine   Quad Sets Left;10 reps   Heel Slides Left;5 reps   Other Supine Knee Exercises hip abdct x 5 reps; ankle pumps x 10 reps          PT Education - 04/06/14 1717    Education provided Yes   Education Details HEP, goals of PT   Person(s) Educated Patient   Methods Explanation;Demonstration;Handout;Tactile cues;Verbal cues   Comprehension Verbalized understanding;Returned demonstration;Need further instruction          PT Short Term Goals - 04/06/14 1720    PT SHORT TERM GOAL #1   Title independent with HEP (05/04/14)   Time 4   Period Weeks   Status New   PT SHORT TERM GOAL #2   Title improve L knee AROM to 10-95 for improved function and mobility  (05/04/14)   Time 4   Period Weeks   Status New   PT SHORT TERM GOAL #3   Title improve gait velocity to >/= 1.1 ft/sec for improved mobility (05/04/14)   Time 4   Period Weeks   Status New   PT SHORT TERM GOAL #4  Title ambulate > 100' with LRAD modified independent without increase in pain (05/04/14)   Time 4   Period Weeks   Status New          PT Long Term Goals - 10-Apr-2014 1722    PT LONG TERM GOAL #1   Title independent with advanced HEP (06/01/14)   Time 8   Period Weeks   Status New   PT LONG TERM GOAL #2   Title improve L knee AROM to 5-105 for improved mobility and function (06/01/14)   Time 8   Period Weeks   Status New   PT LONG TERM GOAL #3   Title verbalize understanding of RICE to reduce risk of reinjury (06/01/14)   Time 8   Period Weeks   Status New   PT LONG TERM GOAL #4   Title improve gait velocity to >/= 1.8 ft/sec for decreased fall risk (06/01/14)   Time 8   Period Weeks   Status New          Plan - 2014-04-10 1718    Clinical Impression Statement Pt presents to [facility] s/p L knee scope with decreased ROM and strength.  Pt will benefit from PT to maximize functional mobility and return to community fitness with decreased pain.   Pt will benefit from skilled therapeutic intervention in order to improve on the following deficits Abnormal gait;Decreased mobility;Decreased balance;Decreased strength;Pain;Decreased activity tolerance;Increased edema;Decreased endurance;Decreased range of motion;Difficulty walking;Impaired flexibility   Rehab Potential Good   PT Frequency 2x / week   PT Duration 8 weeks   PT Treatment/Interventions ADLs/Self Care Home Management;Cryotherapy;Functional mobility training;Compression bandaging;Manual lymph drainage;Neuromuscular re-education;Stair training;Ultrasound;Gait training;Balance training;Manual techniques;Passive range of motion;Therapeutic exercise;DME Instruction;Moist Heat;Therapeutic activities;Patient/family education    PT Next Visit Plan review HEP, continue to focus on ROM/low level strengthening   Consulted and Agree with Plan of Care Patient          G-Codes - 04/10/2014 1723    Functional Assessment Tool Used FOTO 82% limited (predicted 52% limited)   Functional Limitation Mobility: Walking and moving around   Mobility: Walking and Moving Around Current Status ([id number]) At least 80 percent but less than 100 percent impaired, limited or restricted   Mobility: Walking and Moving Around Goal Status [phone]) At least 40 percent but less than 60 percent impaired, limited or restricted                            Problem List Patient Active Problem List   Diagnosis Date Noted  . Fibromyalgia 03/06/2013  . Dense breasts 03/06/2013  . Mastalgia 03/06/2013  . Back pain, lumbosacral 03/06/2013  . Lymphedema of arm 03/06/2013  . Atypical chest pain 01/06/2013  . Breast cancer of upper-inner quadrant of left female breast 09/08/2012  . Family history of malignant neoplasm of breast 09/08/2012  . S/P radiation therapy   . History of breast cancer in female 08/13/2011    Laureen Abrahams, PT, DPT 2014-04-10 5:27 PM  [facility] [address] [place] [address]  [phone] (office) [phone] (fax)

## 2014-04-06 NOTE — Telephone Encounter (Signed)
appts made and printed...td 

## 2014-04-20 ENCOUNTER — Ambulatory Visit: Payer: Commercial Managed Care - HMO | Admitting: Physical Therapy

## 2014-04-20 DIAGNOSIS — Z9889 Other specified postprocedural states: Secondary | ICD-10-CM

## 2014-04-20 DIAGNOSIS — M25662 Stiffness of left knee, not elsewhere classified: Secondary | ICD-10-CM

## 2014-04-20 DIAGNOSIS — R531 Weakness: Secondary | ICD-10-CM

## 2014-04-20 DIAGNOSIS — M25562 Pain in left knee: Secondary | ICD-10-CM

## 2014-04-20 NOTE — Patient Instructions (Signed)
Instructed in use of ice/elevation for swelling control   Copyright  VHI. All rights reserved.  HIP: Flexion / KNEE: Extension, Straight Leg Raise   Raise leg, keeping knee straight. Perform slowly. _10__ reps per set, __1_ sets per day, __7 days per week     Bend knee and pull heel toward buttocks. Hold ____ seconds. Return. Repeat with other knee. Repeat ____ times. Do ____ sessions per day.  http://gt2.exer.us/372   Copyright  VHI. All rights reserved.     Raise leg until knee is straight. ___10 reps per set, __1_ sets per day, __7_ days per week  Copyright  VHI. All rights reserved.

## 2014-04-20 NOTE — Therapy (Signed)
[place], [place], [address] Phone: [phone]   Fax:  [phone]  Physical Therapy Treatment  Patient Details  Name: Ashley Lindsey MRN: [id number] Date of Birth: August 09, 1960  Encounter Date: 04/20/2014      PT End of Session - 04/20/14 1642    Visit Number 2   Number of Visits 16   Date for PT Re-Evaluation 06/05/14   PT Start Time 1552   PT Stop Time 1642   PT Time Calculation (min) 50 min   Activity Tolerance Patient limited by pain      Past Medical History  Diagnosis Date  . Abdominal cramps   . Muscle spasms of head and/or neck     following to the breast  . Fatigue   . Passed out     4th of july  . Migraines   . Chronic headaches   . Arthritis   . Cancer     s/p radiation- last dose 6/12//has been off Tamoxifen 8/12  . Sleep apnea     STOP BANG SCORE 5  . Lymphedema     RIGHT ARM-////  STATES USE LEFT ARM FOR BP'S  . S/P radiation therapy 08/19/07 - 10/10/07    Left Breast/5040 cGy/28 fractions with Boost for a toatl dose of 6300 dGy  . S/P radiation therapy 08/14/10 -10/03/10    right breast  . Costochondritis   . Fibromyalgia 03/06/2013    Past Surgical History  Procedure Laterality Date  . Breast lumpectomy  2009/2012    LEFT/RIGHT with lymph node dissection  . Abdominal hysterectomy      with left salpingooophorectomy  . Knee arthroscopy      right  . Oophorectomy  2013    There were no vitals taken for this visit.  Visit Diagnosis:  S/P left knee arthroscopy  Knee stiffness, left  Left knee pain  Generalized weakness      Subjective Assessment - 04/20/14 1603    Symptoms Presents with QC.  States she continues to have swelling/fluid which she attributes to prednisone.     Pertinent History fibromyalgia, active breast cancer x 2, HTN   Limitations Sitting;Standing;Walking   Currently in Pain? Yes   Pain Score 7    Pain Location Knee   Pain Orientation  Left   Pain Type Surgical pain   Pain Frequency Constant   Aggravating Factors  standing,use of left knee   Pain Relieving Factors ice,rest          [facility] PT Assessment - 04/20/14 1619    AROM   Left Knee Extension 15   Left Knee Flexion 112                  [facility] Adult PT Treatment/Exercise - 04/20/14 1613    Knee/Hip Exercises: Stretches   Active Hamstring Stretch 3 reps;20 seconds   Knee/Hip Exercises: Aerobic   Stationary Bike --  Nu-Step L3 7 min   Knee/Hip Exercises: Seated   Long Arc Quad Left;1 set;10 reps   Other Seated Knee Exercises --  Seated foam rolls 10x   Knee/Hip Exercises: Supine   Heel Slides --  LEs over ball 10x2   Bridges --  On ball 10x   Straight Leg Raises Left;AROM;2 sets;10 reps;AAROM;1 set   Knee Flexion --  HS sets on ball 10x   Knee/Hip Exercises: Sidelying   Clams 12   Cryotherapy   Number Minutes Cryotherapy 8 Minutes   Cryotherapy Location  Knee   Type of Cryotherapy Ice pack                PT Education - 04/20/14 1638    Education provided Yes   Education Details SLR, LAQ   Person(s) Educated Patient   Methods Explanation;Demonstration;Handout   Comprehension Verbalized understanding;Returned demonstration          PT Short Term Goals - 04/20/14 1647    PT SHORT TERM GOAL #1   Title independent with HEP (05/04/14)   Time 4   Period Weeks   Status On-going   PT SHORT TERM GOAL #2   Title improve L knee AROM to 10-95 for improved function and mobility (05/04/14)   Time 4   Period Weeks   Status Partially Met   PT SHORT TERM GOAL #3   Title improve gait velocity to >/= 1.1 ft/sec for improved mobility (05/04/14)   Time 4   Period Weeks   Status On-going   PT SHORT TERM GOAL #4   Title ambulate > 100' with LRAD modified independent without increase in pain (05/04/14)   Time 4   Period Weeks   Status On-going           PT Long Term Goals - 04/20/14 1648    PT LONG TERM GOAL #1   Title  independent with advanced HEP (06/01/14)   Time 8   Period Weeks   Status On-going   PT LONG TERM GOAL #2   Title improve L knee AROM to 5-105 for improved mobility and function (06/01/14)   Time 8   Period Weeks   Status On-going   PT LONG TERM GOAL #3   Title verbalize understanding of RICE to reduce risk of reinjury (06/01/14)   Time 8   Period Weeks   Status On-going   PT LONG TERM GOAL #4   Title improve gait velocity to >/= 1.8 ft/sec for decreased fall risk (06/01/14)   Time 8   Period Weeks   Status On-going               Plan - 04/20/14 1642    Clinical Impression Statement Low repetitions secondary to fibromyalgia and pain intensity.  Knee AROM improved from 88 degrees to 112 degrees.  Decreased quad motor control, needs AAROM.  Therapist cuing patient often to avoid holding breath.  Patient does report decreased pain after session.  Improved gait fluidity.    PT Next Visit Plan Continue Nu-Step, low level knee ROM, quad strengthening;  assess motor control with SLR; cold pack;  STG assessment        Problem List Patient Active Problem List   Diagnosis Date Noted  . Fibromyalgia 03/06/2013  . Dense breasts 03/06/2013  . Mastalgia 03/06/2013  . Back pain, lumbosacral 03/06/2013  . Lymphedema of arm 03/06/2013  . Atypical chest pain 01/06/2013  . Breast cancer of upper-inner quadrant of left female breast 09/08/2012  . Family history of malignant neoplasm of breast 09/08/2012  . S/P radiation therapy   . History of breast cancer in female 08/13/2011   Ruben Im, PT 04/20/2014 4:51 PM Phone: [phone] Fax: [phone] Alvera Singh 04/20/2014, 4:50 PM  [place], [place], [address] Phone: [phone]   Fax:  [phone]

## 2014-04-26 ENCOUNTER — Ambulatory Visit: Payer: Commercial Managed Care - HMO | Attending: Orthopaedic Surgery | Admitting: Physical Therapy

## 2014-04-26 DIAGNOSIS — R531 Weakness: Secondary | ICD-10-CM | POA: Diagnosis not present

## 2014-04-26 DIAGNOSIS — Z9889 Other specified postprocedural states: Secondary | ICD-10-CM | POA: Diagnosis present

## 2014-04-26 DIAGNOSIS — M25562 Pain in left knee: Secondary | ICD-10-CM

## 2014-04-26 DIAGNOSIS — M25662 Stiffness of left knee, not elsewhere classified: Secondary | ICD-10-CM | POA: Diagnosis not present

## 2014-04-26 NOTE — Therapy (Addendum)
[place], [place], [address] Phone: [phone]   Fax:  [phone]  Physical Therapy Treatment  Patient Details  Name: Ashley Lindsey MRN: [id number] Date of Birth: [birth year]  Encounter Date: 04/26/2014      PT End of Session - 04/26/14 1551    Visit Number 3   Number of Visits 16   Date for PT Re-Evaluation 06/05/14   PT Start Time 1520   PT Stop Time 1605   PT Time Calculation (min) 45 min   Activity Tolerance Patient limited by pain   Behavior During Therapy [facility] for tasks assessed/performed      Past Medical History  Diagnosis Date  . Abdominal cramps   . Muscle spasms of head and/or neck     following to the breast  . Fatigue   . Passed out     4th of july  . Migraines   . Chronic headaches   . Arthritis   . Cancer     s/p radiation- last dose 6/12//has been off Tamoxifen 8/12  . Sleep apnea     STOP BANG SCORE 5  . Lymphedema     RIGHT ARM-////  STATES USE LEFT ARM FOR BP'S  . S/P radiation therapy 08/19/07 - 10/10/07    Left Breast/5040 cGy/28 fractions with Boost for a toatl dose of 6300 dGy  . S/P radiation therapy 08/14/10 -10/03/10    right breast  . Costochondritis   . Fibromyalgia 03/06/2013    Past Surgical History  Procedure Laterality Date  . Breast lumpectomy  2009/2012    LEFT/RIGHT with lymph node dissection  . Abdominal hysterectomy      with left salpingooophorectomy  . Knee arthroscopy      right  . Oophorectomy  2013    There were no vitals taken for this visit.  Visit Diagnosis:  S/P left knee arthroscopy  Knee stiffness, left  Left knee pain  Generalized weakness      Subjective Assessment - 04/26/14 1529    Symptoms Pt arrived late for PT session.  L knee painful but doing well.   Pertinent History fibromyalgia, active breast cancer x 2, HTN   Limitations Sitting;Standing;Walking   How long can you sit comfortably? 1 hour   How long can you  stand comfortably? 10-20 min   How long can you walk comfortably? 5-10 min   Diagnostic tests bil knee OA   Patient Stated Goals return to water aerobics, jazzercise, decrease pain   Currently in Pain? Yes   Pain Score 5    Pain Location Knee   Pain Orientation Left   Pain Descriptors / Indicators Dull;Aching;Stabbing   Pain Type Surgical pain   Pain Onset More than a month ago   Pain Frequency Constant   Aggravating Factors  standing, using L knee   Pain Relieving Factors ice, rest                    [facility] Adult PT Treatment/Exercise - 04/26/14 1531    Knee/Hip Exercises: Aerobic   Stationary Bike NuStep Level 3 x 8 min   Knee/Hip Exercises: Seated   Long Arc Quad Left;Weights;2 sets;10 reps   Long Arc Quad Weight 2 lbs.   Other Seated Knee Exercises seated marching with 2# LLE 2x10; seated hamstring curls with red theraband 2x10   Modalities   Modalities Cryotherapy   Cryotherapy   Number Minutes Cryotherapy 15 Minutes   Cryotherapy Location Knee  Type of Cryotherapy Other (comment)  vasopneumatic mod pressure                PT Education - 04/26/14 1550    Education provided Yes   Education Details expected pain and soreness with exercises   Person(s) Educated Patient   Methods Explanation;Demonstration;Handout   Comprehension Verbalized understanding;Returned demonstration          PT Short Term Goals - 04/26/14 1552    PT SHORT TERM GOAL #1   Title independent with HEP (05/04/14)   Time 4   Period Weeks   Status On-going   PT SHORT TERM GOAL #2   Title improve L knee AROM to 10-95 for improved function and mobility (05/04/14)   Time 4   Period Weeks   Status Partially Met   PT SHORT TERM GOAL #3   Title improve gait velocity to >/= 1.1 ft/sec for improved mobility (05/04/14)   Time 4   Period Weeks   Status On-going   PT SHORT TERM GOAL #4   Title ambulate > 100' with LRAD modified independent without increase in pain (05/04/14)    Time 4   Period Weeks   Status On-going           PT Long Term Goals - 04/26/14 1553    PT LONG TERM GOAL #1   Title independent with advanced HEP (06/01/14)   Time 8   Period Weeks   Status On-going   PT LONG TERM GOAL #2   Title improve L knee AROM to 5-105 for improved mobility and function (06/01/14)   Time 8   Period Weeks   Status On-going   PT LONG TERM GOAL #3   Title verbalize understanding of RICE to reduce risk of reinjury (06/01/14)   Time 8   Period Weeks   Status On-going   PT LONG TERM GOAL #4   Title improve gait velocity to >/= 1.8 ft/sec for decreased fall risk (06/01/14)   Time 8   Period Weeks   Status On-going               Plan - 04/26/14 1551    Clinical Impression Statement Pt with improved gait mechanics, still with decreased strength in LLE.  Will continue to benefit from PT to maximize function.  Progressing towards goals.   PT Next Visit Plan Continue Nu-Step, low level knee ROM, quad strengthening;  assess motor control with SLR; cold pack;  begin checking STGs   Consulted and Agree with Plan of Care Patient        Problem List Patient Active Problem List   Diagnosis Date Noted  . Fibromyalgia 03/06/2013  . Dense breasts 03/06/2013  . Mastalgia 03/06/2013  . Back pain, lumbosacral 03/06/2013  . Lymphedema of arm 03/06/2013  . Atypical chest pain 01/06/2013  . Breast cancer of upper-inner quadrant of left female breast 09/08/2012  . Family history of malignant neoplasm of breast 09/08/2012  . S/P radiation therapy   . History of breast cancer in female 08/13/2011   Laureen Abrahams, PT, DPT 04/26/2014 4:10 PM  [place], [place], [address] Phone: [phone]   Fax:  [phone]     PHYSICAL THERAPY DISCHARGE SUMMARY  Visits from Start of Care: 3  Current functional level related to goals / functional outcomes: See above; pt did not show for  appts and did not call to reschedule   Remaining deficits: Unknown as pt  did not return    Education / Equipment: HEP  Plan: Patient agrees to discharge.  Patient goals were not met. Patient is being discharged due to not returning since the last visit.  ?????    Laureen Abrahams, PT, DPT 07/01/2014 5:19 PM   [facility] [address] [place] [address]  [phone] (office) [phone] (fax)

## 2014-04-29 ENCOUNTER — Ambulatory Visit: Payer: Commercial Managed Care - HMO

## 2014-05-01 ENCOUNTER — Encounter (HOSPITAL_COMMUNITY): Payer: Self-pay | Admitting: Emergency Medicine

## 2014-05-01 ENCOUNTER — Emergency Department (INDEPENDENT_AMBULATORY_CARE_PROVIDER_SITE_OTHER)
Admission: EM | Admit: 2014-05-01 | Discharge: 2014-05-01 | Disposition: A | Discharge status: Home / Self Care | Payer: Commercial Managed Care - HMO | Source: Home / Self Care | Attending: Emergency Medicine | Admitting: Emergency Medicine

## 2014-05-01 DIAGNOSIS — L509 Urticaria, unspecified: Secondary | ICD-10-CM

## 2014-05-01 MED ORDER — PREDNISONE 20 MG PO TABS
40.0000 mg | ORAL_TABLET | Freq: Once | ORAL | Status: AC
  Administered 2014-05-01: 40 mg via ORAL

## 2014-05-01 MED ORDER — PREDNISONE 20 MG PO TABS
ORAL_TABLET | ORAL | Status: AC
  Filled 2014-05-01: qty 2

## 2014-05-01 MED ORDER — FAMOTIDINE 20 MG PO TABS
20.0000 mg | ORAL_TABLET | Freq: Once | ORAL | Status: AC
  Administered 2014-05-01: 20 mg via ORAL

## 2014-05-01 MED ORDER — PREDNISONE 10 MG PO TABS: ORAL_TABLET | ORAL | Status: DC

## 2014-05-01 MED ORDER — HYDROXYZINE HCL 25 MG PO TABS: 25.0000 mg | ORAL_TABLET | Freq: Three times a day (TID) | ORAL | Status: DC | PRN

## 2014-05-01 MED ORDER — FAMOTIDINE 20 MG PO TABS
ORAL_TABLET | ORAL | Status: AC
  Filled 2014-05-01: qty 1

## 2014-05-01 NOTE — ED Notes (Signed)
Pt reports insect bite under right axilla and lower abd onset yest Itches a lot; did not notice or feel the bite Denies fevers,chills Alert, no signs of acute distress.

## 2014-05-01 NOTE — ED Provider Notes (Signed)
CSN: [id number]     Arrival date & time 05/01/14  1456 History   First MD Initiated Contact with Patient 05/01/14 1620     Chief Complaint  Patient presents with  . Insect Bite   (Consider location/radiation/quality/duration/timing/severity/associated sxs/prior Treatment) HPI Comments: Thinks she was either bitten or stung by an insect yesterday. Remembers noticing a pruritic area at her right posterior shoulder while in the shower yesterday. States she has been taking oral benadryl and applying topical hydrocortisone cream with little improvement. States she noticed another similar area on her left lower abdomen today. No previous episodes.  No new known exposures No swelling of lips, throat or tongue.   The history is provided by the patient.    Past Medical History  Diagnosis Date  . Abdominal cramps   . Muscle spasms of head and/or neck     following to the breast  . Fatigue   . Passed out     4th of july  . Migraines   . Chronic headaches   . Arthritis   . Cancer     s/p radiation- last dose 6/12//has been off Tamoxifen 8/12  . Sleep apnea     STOP BANG SCORE 5  . Lymphedema     RIGHT ARM-////  STATES USE LEFT ARM FOR BP'S  . S/P radiation therapy 08/19/07 - 10/10/07    Left Breast/5040 cGy/28 fractions with Boost for a toatl dose of 6300 dGy  . S/P radiation therapy 08/14/10 -10/03/10    right breast  . Costochondritis   . Fibromyalgia 03/06/2013   Past Surgical History  Procedure Laterality Date  . Breast lumpectomy  2009/2012    LEFT/RIGHT with lymph node dissection  . Abdominal hysterectomy      with left salpingooophorectomy  . Knee arthroscopy      right  . Oophorectomy  2013   Family History  Problem Relation Age of Onset  . Breast cancer Mother 55  . Heart disease Father   . Prostate cancer Maternal Uncle 78  . Cancer Maternal Aunt 63    d. from "female cancer" possibly cervical  . Prostate cancer Paternal Uncle   . Prostate cancer Maternal Grandfather  63  . Prostate cancer Maternal Uncle 80  . Prostate cancer Paternal Uncle   . Breast cancer Cousin 22    mat 1st cousin   History  Substance Use Topics  . Smoking status: Current Every Day Smoker -- 0.25 packs/day for 1 years    Types: Cigarettes  . Smokeless tobacco: Never Used  . Alcohol Use: Yes     Comment: 3 x a week   OB History    No data available     Review of Systems  All other systems reviewed and are negative.   Allergies  Aspirin and Penicillins  Home Medications   Prior to Admission medications   Medication Sig Start Date End Date Taking? Authorizing Provider  cholecalciferol (VITAMIN D) 1000 UNITS tablet Take 2,000 Units by mouth 2 (two) times daily.   Yes Historical Provider, MD  hydrochlorothiazide (HYDRODIURIL) 25 MG tablet Take 25 mg by mouth at bedtime.    Yes Historical Provider, MD  lisinopril (PRINIVIL,ZESTRIL) 10 MG tablet Take 10 mg by mouth daily.   Yes Historical Provider, MD  pregabalin (LYRICA) 75 MG capsule Take 1 capsule (75 mg total) by mouth 2 (two) times daily. 03/14/12  Yes Eston Esters, MD  ALPRAZolam Duanne Moron) 1 MG tablet Take 1 mg by mouth 3 (three) times  daily as needed. anxiety    Historical Provider, MD  butalbital-aspirin-caffeine [facility]) 50-325-40 MG per capsule Take 1 capsule by mouth 3 (three) times daily as needed. For headache    Historical Provider, MD  cyclobenzaprine (FLEXERIL) 10 MG tablet Take 10 mg by mouth 3 (three) times daily as needed for muscle spasms.     Historical Provider, MD  fish oil-omega-3 fatty acids 1000 MG capsule Take 1 capsule by mouth daily with breakfast.     Historical Provider, MD  hydrOXYzine (ATARAX/VISTARIL) 25 MG tablet Take 1 tablet (25 mg total) by mouth every 8 (eight) hours as needed for itching. 05/01/14   Audelia Hives Avonelle Viveros, PA  ibuprofen (ADVIL,MOTRIN) 800 MG tablet Take 800 mg by mouth every 8 (eight) hours as needed. For pain    Historical Provider, MD  omeprazole (PRILOSEC) 40 MG capsule   06/15/13   Historical Provider, MD  predniSONE (DELTASONE) 10 MG tablet Beginning 05/02/2014, take 3 tabs po QD day 1, 2 tabs po QD day 2, 1 tab po QD day 3 then stop 05/01/14   Lutricia Feil, PA  senna (SENOKOT) 8.6 MG tablet Take 1 tablet by mouth every other day.     Historical Provider, MD  traMADol (ULTRAM) 50 MG tablet Take 1 tablet (50 mg total) by mouth every 6 (six) hours as needed (pain). 04/28/13   Amy Milda Smart, PA-C  UNABLE TO FIND Rx: L8000-Post Surgical Bra (Quantity: 6) E7076- Silicone Breast Prosthesis (Quantity: 2) Dx: 174.9; Bilateral partial mastectomy Replacement due to hot flashes since previous items were too heavy and hot 01/20/13   Fanny Skates, MD   BP 147/90 mmHg  Pulse 95  Temp(Src) 98.4 F (36.9 C) (Oral)  Resp 16  SpO2 100% Physical Exam  Constitutional: She is oriented to person, place, and time. She appears well-developed and well-nourished. No distress.  +obese  HENT:  Head: Normocephalic and atraumatic.  Mouth/Throat: Oropharynx is clear and moist.  Eyes: Conjunctivae are normal.  Cardiovascular: Normal rate, regular rhythm and normal heart sounds.   Pulmonary/Chest: Effort normal and breath sounds normal. No respiratory distress. She has no wheezes.  Musculoskeletal: Normal range of motion.  Neurological: She is alert and oriented to person, place, and time.  Skin: Skin is warm and dry. Rash noted. There is erythema.  7 cm x 5 cm urticarial lesion at right posterior shoulder Small similar lesion at left lower anterior abdomen.   Psychiatric: She has a normal mood and affect. Her behavior is normal.  Nursing note and vitals reviewed.   ED Course  Procedures (including critical care time) Labs Review Labs Reviewed - No data to display  Imaging Review No results found.   MDM   1. Localized hives    Prednisone and atarax as prescribed with PCP follow up if no improvement.    Lutricia Feil, [place] 05/01/14 [phone]

## 2014-05-01 NOTE — Discharge Instructions (Signed)
Hives Hives are itchy, red, swollen areas of the skin. They can vary in size and location on your body. Hives can come and go for hours or several days (acute hives) or for several weeks (chronic hives). Hives do not spread from person to person (noncontagious). They may get worse with scratching, exercise, and emotional stress. CAUSES   Allergic reaction to food, additives, or drugs.  Infections, including the common cold.  Illness, such as vasculitis, lupus, or thyroid disease.  Exposure to sunlight, heat, or cold.  Exercise.  Stress.  Contact with chemicals. SYMPTOMS   Red or white swollen patches on the skin. The patches may change size, shape, and location quickly and repeatedly.  Itching.  Swelling of the hands, feet, and face. This may occur if hives develop deeper in the skin. DIAGNOSIS  Your caregiver can usually tell what is wrong by performing a physical exam. Skin or blood tests may also be done to determine the cause of your hives. In some cases, the cause cannot be determined. TREATMENT  Mild cases usually get better with medicines such as antihistamines. Severe cases may require an emergency epinephrine injection. If the cause of your hives is known, treatment includes avoiding that trigger.  HOME CARE INSTRUCTIONS   Avoid causes that trigger your hives.  Take antihistamines as directed by your caregiver to reduce the severity of your hives. Non-sedating or low-sedating antihistamines are usually recommended. Do not drive while taking an antihistamine.  Take any other medicines prescribed for itching as directed by your caregiver.  Wear loose-fitting clothing.  Keep all follow-up appointments as directed by your caregiver. SEEK MEDICAL CARE IF:   You have persistent or severe itching that is not relieved with medicine.  You have painful or swollen joints. SEEK IMMEDIATE MEDICAL CARE IF:   You have a fever.  Your tongue or lips are swollen.  You have  trouble breathing or swallowing.  You feel tightness in the throat or chest.  You have abdominal pain. These problems may be the first sign of a life-threatening allergic reaction. Call your local emergency services (911 in [place] MAKE SURE YOU:   Understand these instructions.  Will watch your condition.  Will get help right away if you are not doing well or get worse. Document Released: 04/09/2005 Document Revised: 04/14/2013 Document Reviewed: 07/03/2011 Exit[employer] Patient Information 2015 [employer]. This information is not intended to replace advice given to you by your health care provider. Make sure you discuss any questions you have with your health care provider.  

## 2014-05-06 ENCOUNTER — Ambulatory Visit: Payer: Commercial Managed Care - HMO

## 2014-05-11 ENCOUNTER — Ambulatory Visit: Payer: Commercial Managed Care - HMO

## 2014-06-09 ENCOUNTER — Telehealth: Payer: Self-pay | Admitting: Hematology and Oncology

## 2014-06-09 NOTE — Telephone Encounter (Signed)
pt called to confirm appt.... done...pt ok and aware °

## 2014-06-11 NOTE — Telephone Encounter (Signed)
none

## 2014-06-14 ENCOUNTER — Other Ambulatory Visit: Payer: Commercial Managed Care - HMO

## 2014-06-18 ENCOUNTER — Other Ambulatory Visit: Payer: Commercial Managed Care - HMO

## 2014-06-28 ENCOUNTER — Other Ambulatory Visit: Payer: Commercial Managed Care - HMO

## 2014-07-01 ENCOUNTER — Ambulatory Visit: Payer: Commercial Managed Care - HMO | Admitting: Hematology and Oncology

## 2014-07-01 ENCOUNTER — Other Ambulatory Visit: Payer: Commercial Managed Care - HMO

## 2014-07-02 ENCOUNTER — Ambulatory Visit
Admission: RE | Admit: 2014-07-02 | Discharge: 2014-07-02 | Disposition: A | Discharge status: Home / Self Care | Payer: Commercial Managed Care - HMO | Source: Ambulatory Visit | Attending: Hematology and Oncology | Admitting: Hematology and Oncology

## 2014-07-02 DIAGNOSIS — Z853 Personal history of malignant neoplasm of breast: Secondary | ICD-10-CM

## 2014-07-07 ENCOUNTER — Inpatient Hospital Stay: Admission: RE | Admit: 2014-07-07 | Payer: Commercial Managed Care - HMO | Source: Ambulatory Visit

## 2014-07-12 ENCOUNTER — Other Ambulatory Visit: Payer: Commercial Managed Care - HMO

## 2014-07-12 ENCOUNTER — Telehealth: Payer: Self-pay | Admitting: Hematology and Oncology

## 2014-07-12 ENCOUNTER — Ambulatory Visit: Payer: Commercial Managed Care - HMO | Admitting: Hematology and Oncology

## 2014-07-12 NOTE — Telephone Encounter (Signed)
Patient called in to check on appointment times which her appt was now,we have rescheduled her appt

## 2014-07-18 ENCOUNTER — Other Ambulatory Visit: Payer: Self-pay | Admitting: Oncology

## 2014-07-19 ENCOUNTER — Ambulatory Visit: Payer: Commercial Managed Care - HMO | Admitting: Hematology and Oncology

## 2014-07-19 ENCOUNTER — Other Ambulatory Visit: Payer: Commercial Managed Care - HMO

## 2014-07-19 ENCOUNTER — Telehealth: Payer: Self-pay | Admitting: Hematology and Oncology

## 2014-07-19 NOTE — Telephone Encounter (Signed)
Patient called in as she has a stomach virus and we have rescheduled her to 3/30

## 2014-07-21 ENCOUNTER — Ambulatory Visit (HOSPITAL_BASED_OUTPATIENT_CLINIC_OR_DEPARTMENT_OTHER): Payer: Commercial Managed Care - HMO | Admitting: Hematology and Oncology

## 2014-07-21 ENCOUNTER — Other Ambulatory Visit (HOSPITAL_BASED_OUTPATIENT_CLINIC_OR_DEPARTMENT_OTHER): Payer: Commercial Managed Care - HMO

## 2014-07-21 VITALS — BP 123/83 | HR 97 | Temp 97.8°F | Resp 19 | Ht 63.0 in | Wt 259.9 lb

## 2014-07-21 DIAGNOSIS — Z170 Estrogen receptor positive status [ER+]: Secondary | ICD-10-CM

## 2014-07-21 DIAGNOSIS — C50212 Malignant neoplasm of upper-inner quadrant of left female breast: Secondary | ICD-10-CM | POA: Diagnosis not present

## 2014-07-21 LAB — CBC WITH DIFFERENTIAL/PLATELET
BASO%: 0.8 % (ref 0.0–2.0)
Basophils Absolute: 0.1 10*3/uL (ref 0.0–0.1)
EOS%: 1 % (ref 0.0–7.0)
Eosinophils Absolute: 0.1 10*3/uL (ref 0.0–0.5)
HCT: 38.7 % (ref 34.8–46.6)
HGB: 12.1 g/dL (ref 11.6–15.9)
LYMPH%: 29 % (ref 14.0–49.7)
MCH: 26.2 pg (ref 25.1–34.0)
MCHC: 31.3 g/dL — ABNORMAL LOW (ref 31.5–36.0)
MCV: 83.8 fL (ref 79.5–101.0)
MONO#: 0.5 10*3/uL (ref 0.1–0.9)
MONO%: 5.8 % (ref 0.0–14.0)
NEUT#: 5 10*3/uL (ref 1.5–6.5)
NEUT%: 63.4 % (ref 38.4–76.8)
Platelets: 255 10*3/uL (ref 145–400)
RBC: 4.61 10*6/uL (ref 3.70–5.45)
RDW: 14.3 % (ref 11.2–14.5)
WBC: 7.9 10*3/uL (ref 3.9–10.3)
lymph#: 2.3 10*3/uL (ref 0.9–3.3)

## 2014-07-21 LAB — COMPREHENSIVE METABOLIC PANEL (CC13)
ALT: 23 U/L (ref 0–55)
AST: 21 U/L (ref 5–34)
Albumin: 3.3 g/dL — ABNORMAL LOW (ref 3.5–5.0)
Alkaline Phosphatase: 72 U/L (ref 40–150)
Anion Gap: 11 mEq/L (ref 3–11)
BUN: 8.2 mg/dL (ref 7.0–26.0)
CO2: 27 mEq/L (ref 22–29)
Calcium: 8.6 mg/dL (ref 8.4–10.4)
Chloride: 105 mEq/L (ref 98–109)
Creatinine: 0.7 mg/dL (ref 0.6–1.1)
EGFR: >90 mL/min/{1.73_m2} (ref >90)
Glucose: 86 mg/dl (ref 70–140)
Potassium: 3.6 mEq/L (ref 3.5–5.1)
Sodium: 143 mEq/L (ref 136–145)
Total Bilirubin: 0.2 mg/dL (ref 0.20–1.20)
Total Protein: 6.8 g/dL (ref 6.4–8.3)

## 2014-07-21 NOTE — Assessment & Plan Note (Signed)
Left breast invasive ductal carcinoma grade 1 ER 77% PR 96% gases on 5% HER-2 negative, T1 AN0M0 stage I A. status post lumpectomy 2.7 2009 followed by radiation therapy completed 10/10/2007, tamoxifen started September 2009 discontinued March 2012 followed by recurrence in the right breast as DCIS 05/26/2010 completed another lumpectomy and radiation therapy and she has been on tamoxifen ever since.  Tamoxifen counseling: Patient is tolerating it very well without any major problems. Recommended continuing tamoxifen for at least 5 years from 2012 recurrence. So she will complete tamoxifen therapy in July 2017.  Surveillance: Today's breast exam was completely normal. Recommended continued annual followup and surveillance. Because she has extremely tender breasts, I am recommending that she undergo MRI surveillance in February of every year.  Return to clinic in March 2015 to followup on MRI results  fibromyalgia: I believe that is the reason for her breast tenderness. There is no palpable nodularity.

## 2014-07-22 ENCOUNTER — Telehealth: Payer: Self-pay | Admitting: Hematology and Oncology

## 2014-07-22 NOTE — Telephone Encounter (Signed)
Called patient and she is aware of her one yr appoinment

## 2014-07-22 NOTE — Progress Notes (Signed)
No care team member to display  DIAGNOSIS: Breast cancer of upper-inner quadrant of left female breast   Staging form: Breast, AJCC 7th Edition     Clinical: T1a, N0, cM0 - Unsigned     Pathologic: Stage IA (T1a, N0, cM0) - Signed by Rulon Eisenmenger, MD on 02/11/2014   SUMMARY OF ONCOLOGIC HISTORY:   Breast cancer of upper-inner quadrant of left female breast   05/27/2007 Initial Diagnosis Left breast biopsy: DCIS with calcifications ER/PR 100% positive, MRI revealed 1.7 x 1.4 x 0.9 cm DCIS at 12:00 position left breast   06/20/2007 Surgery Left breast lumpectomy: T1 N0 M0 stage IA invasive ductal carcinoma 0.4-0.5 cm grade 1, ER 77%, PR 96%, Ki-67 less than 5%, HER-2 negative   08/21/2007 - 10/10/2007 Radiation Therapy Adjuvant radiation therapy   01/14/2008 -  Anti-estrogen oral therapy Tamoxifen 20 mg daily    CHIEF COMPLIANT: F/U on Tamoxifen for breast cancer  INTERVAL HISTORY: Ashley Lindsey is a 54 yr old with left breast cancer currently on Tamoxifen and tolerating it well. She denies any new lumps or nodules.  REVIEW OF SYSTEMS:   Constitutional: Denies fevers, chills or abnormal weight loss Eyes: Denies blurriness of vision Ears, nose, mouth, throat, and face: Denies mucositis or sore throat Respiratory: Denies cough, dyspnea or wheezes Cardiovascular: Denies palpitation, chest discomfort or lower extremity swelling Gastrointestinal:  Denies nausea, heartburn or change in bowel habits Skin: Denies abnormal skin rashes Lymphatics: Denies new lymphadenopathy or easy bruising Neurological:Denies numbness, tingling or new weaknesses Behavioral/Psych: Mood is stable, no new changes  Breast:  denies any pain or lumps or nodules in either breasts All other systems were reviewed with the patient and are negative.  I have reviewed the past medical history, past surgical history, social history and family history with the patient and they are unchanged from previous note.  ALLERGIES:   is allergic to aspirin and penicillins.  MEDICATIONS:  Current Outpatient Prescriptions  Medication Sig Dispense Refill  . ALPRAZolam (XANAX) 1 MG tablet Take 1 mg by mouth 3 (three) times daily as needed. anxiety    . butalbital-aspirin-caffeine (FIORINAL) 50-325-40 MG per capsule Take 1 capsule by mouth 3 (three) times daily as needed. For headache    . cholecalciferol (VITAMIN D) 1000 UNITS tablet Take 2,000 Units by mouth 2 (two) times daily.    . cyclobenzaprine (FLEXERIL) 10 MG tablet Take 10 mg by mouth 3 (three) times daily as needed for muscle spasms.     . fish oil-omega-3 fatty acids 1000 MG capsule Take 1 capsule by mouth daily with breakfast.     . hydrOXYzine (ATARAX/VISTARIL) 25 MG tablet Take 1 tablet (25 mg total) by mouth every 8 (eight) hours as needed for itching. 20 tablet 0  . ibuprofen (ADVIL,MOTRIN) 800 MG tablet Take 800 mg by mouth every 8 (eight) hours as needed. For pain    . lisinopril (PRINIVIL,ZESTRIL) 10 MG tablet Take 10 mg by mouth daily.    Marland Kitchen omeprazole (PRILOSEC) 40 MG capsule     . predniSONE (DELTASONE) 10 MG tablet Beginning 05/02/2014, take 3 tabs po QD day 1, 2 tabs po QD day 2, 1 tab po QD day 3 then stop 6 tablet 0  . pregabalin (LYRICA) 75 MG capsule Take 1 capsule (75 mg total) by mouth 2 (two) times daily. 60 capsule 1  . senna (SENOKOT) 8.6 MG tablet Take 1 tablet by mouth every other day.     . tamoxifen (NOLVADEX) 20 MG  tablet TAKE 1 TABLET (20 MG TOTAL) BY MOUTH DAILY. 90 tablet 3  . UNABLE TO FIND Rx: L8000-Post Surgical Bra (Quantity: 6) H2992- Silicone Breast Prosthesis (Quantity: 2) Dx: 174.9; Bilateral partial mastectomy Replacement due to hot flashes since previous items were too heavy and hot 1 each 0   No current facility-administered medications for this visit.    PHYSICAL EXAMINATION: ECOG PERFORMANCE STATUS: 1 - Symptomatic but completely ambulatory  Filed Vitals:   07/21/14 1535  BP: 123/83  Pulse: 97  Temp: 97.8 F (36.6  C)  Resp: 19   Filed Weights   07/21/14 1535  Weight: 259 lb 14.4 oz (117.89 kg)    GENERAL:alert, no distress and comfortable SKIN: skin color, texture, turgor are normal, no rashes or significant lesions EYES: normal, Conjunctiva are pink and non-injected, sclera clear OROPHARYNX:no exudate, no erythema and lips, buccal mucosa, and tongue normal  NECK: supple, thyroid normal size, non-tender, without nodularity LYMPH:  no palpable lymphadenopathy in the cervical, axillary or inguinal LUNGS: clear to auscultation and percussion with normal breathing effort HEART: regular rate & rhythm and no murmurs and no lower extremity edema ABDOMEN:abdomen soft, non-tender and normal bowel sounds Musculoskeletal:no cyanosis of digits and no clubbing  NEURO: alert & oriented x 3 with fluent speech, no focal motor/sensory deficits BREAST: No palpable masses or nodules in either right or left breasts. No palpable axillary supraclavicular or infraclavicular adenopathy no breast tenderness or nipple discharge. (exam performed in the presence of a chaperone)  LABORATORY DATA:  I have reviewed the data as listed   Chemistry      Component Value Date/Time   NA 143 07/21/2014 1517   NA 137 01/06/2013 1835   K 3.6 07/21/2014 1517   K 4.0 01/06/2013 1835   CL 103 01/06/2013 1835   CL 105 12/26/2011 1538   CO2 27 07/21/2014 1517   CO2 27 01/06/2013 1835   BUN 8.2 07/21/2014 1517   BUN 12 01/06/2013 1835   CREATININE 0.7 07/21/2014 1517   CREATININE 0.66 01/06/2013 1835      Component Value Date/Time   CALCIUM 8.6 07/21/2014 1517   CALCIUM 8.4 01/06/2013 1835   ALKPHOS 72 07/21/2014 1517   ALKPHOS 94 01/06/2013 1835   AST 21 07/21/2014 1517   AST 16 01/06/2013 1835   ALT 23 07/21/2014 1517   ALT 20 01/06/2013 1835   BILITOT 0.20 07/21/2014 1517   BILITOT 0.1* 01/06/2013 1835       Lab Results  Component Value Date   WBC 7.9 07/21/2014   HGB 12.1 07/21/2014   HCT 38.7 07/21/2014    MCV 83.8 07/21/2014   PLT 255 07/21/2014   NEUTROABS 5.0 07/21/2014    ASSESSMENT & PLAN:  Breast cancer of upper-inner quadrant of left female breast Left breast invasive ductal carcinoma grade 1 ER 77% PR 96% gases on 5% HER-2 negative, T1 AN0M0 stage I A. status post lumpectomy 2.7 2009 followed by radiation therapy completed 10/10/2007, tamoxifen started September 2009 discontinued March 2012 followed by recurrence in the right breast as DCIS 05/26/2010 completed another lumpectomy and radiation therapy and she has been on tamoxifen ever since.  Tamoxifen counseling: Patient is tolerating it very well without any major problems.  she will complete 5 yrs of tamoxifen therapy in July 2017. But we may decide to add 5 more years to that. We can decide in 2017.  Surveillance: Today's breast exam was completely normal. Recommended continued annual followup and surveillance. We had previously done  MRIs but radiology did not feel she needs MRIs anymore. Patient is able to tolerate them (although she still complains of pain)   fibromyalgia: I believe that is the reason for her breast tenderness. There is no palpable nodularity.     Orders Placed This Encounter  Procedures  . CBC with Differential    Standing Status: Future     Number of Occurrences:      Standing Expiration Date: 07/21/2015  . Comprehensive metabolic panel (Cmet) - [facility]    Standing Status: Future     Number of Occurrences:      Standing Expiration Date: 07/21/2015   The patient has a good understanding of the overall plan. she agrees with it. She will call with any problems that may develop before her next visit here.   Rulon Eisenmenger, MD

## 2014-09-21 ENCOUNTER — Other Ambulatory Visit (HOSPITAL_COMMUNITY): Payer: Self-pay

## 2014-09-21 ENCOUNTER — Encounter (HOSPITAL_COMMUNITY): Payer: Self-pay | Admitting: Emergency Medicine

## 2014-09-21 ENCOUNTER — Emergency Department (HOSPITAL_COMMUNITY): Payer: Commercial Managed Care - HMO

## 2014-09-21 ENCOUNTER — Observation Stay (HOSPITAL_COMMUNITY)
Admission: EM | Admit: 2014-09-21 | Discharge: 2014-09-23 | Disposition: A | Discharge status: Home / Self Care | Payer: Commercial Managed Care - HMO | Attending: Internal Medicine | Admitting: Internal Medicine

## 2014-09-21 DIAGNOSIS — J9601 Acute respiratory failure with hypoxia: Secondary | ICD-10-CM | POA: Diagnosis not present

## 2014-09-21 DIAGNOSIS — J209 Acute bronchitis, unspecified: Secondary | ICD-10-CM | POA: Diagnosis not present

## 2014-09-21 DIAGNOSIS — M79606 Pain in leg, unspecified: Secondary | ICD-10-CM | POA: Diagnosis present

## 2014-09-21 DIAGNOSIS — M1990 Unspecified osteoarthritis, unspecified site: Secondary | ICD-10-CM | POA: Insufficient documentation

## 2014-09-21 DIAGNOSIS — R0902 Hypoxemia: Secondary | ICD-10-CM | POA: Diagnosis not present

## 2014-09-21 DIAGNOSIS — Z923 Personal history of irradiation: Secondary | ICD-10-CM | POA: Insufficient documentation

## 2014-09-21 DIAGNOSIS — Z853 Personal history of malignant neoplasm of breast: Secondary | ICD-10-CM | POA: Insufficient documentation

## 2014-09-21 DIAGNOSIS — M797 Fibromyalgia: Secondary | ICD-10-CM | POA: Insufficient documentation

## 2014-09-21 DIAGNOSIS — R0600 Dyspnea, unspecified: Secondary | ICD-10-CM

## 2014-09-21 DIAGNOSIS — G43909 Migraine, unspecified, not intractable, without status migrainosus: Secondary | ICD-10-CM | POA: Diagnosis not present

## 2014-09-21 DIAGNOSIS — Z90710 Acquired absence of both cervix and uterus: Secondary | ICD-10-CM | POA: Diagnosis not present

## 2014-09-21 DIAGNOSIS — R059 Cough, unspecified: Secondary | ICD-10-CM

## 2014-09-21 DIAGNOSIS — R05 Cough: Secondary | ICD-10-CM | POA: Diagnosis present

## 2014-09-21 DIAGNOSIS — Z880 Allergy status to penicillin: Secondary | ICD-10-CM | POA: Diagnosis not present

## 2014-09-21 DIAGNOSIS — J9801 Acute bronchospasm: Secondary | ICD-10-CM | POA: Diagnosis not present

## 2014-09-21 DIAGNOSIS — Z886 Allergy status to analgesic agent status: Secondary | ICD-10-CM | POA: Insufficient documentation

## 2014-09-21 DIAGNOSIS — F17210 Nicotine dependence, cigarettes, uncomplicated: Secondary | ICD-10-CM | POA: Insufficient documentation

## 2014-09-21 DIAGNOSIS — M25561 Pain in right knee: Secondary | ICD-10-CM

## 2014-09-21 DIAGNOSIS — J069 Acute upper respiratory infection, unspecified: Secondary | ICD-10-CM

## 2014-09-21 DIAGNOSIS — J101 Influenza due to other identified influenza virus with other respiratory manifestations: Secondary | ICD-10-CM | POA: Diagnosis present

## 2014-09-21 DIAGNOSIS — J111 Influenza due to unidentified influenza virus with other respiratory manifestations: Secondary | ICD-10-CM | POA: Diagnosis present

## 2014-09-21 DIAGNOSIS — G8929 Other chronic pain: Secondary | ICD-10-CM | POA: Diagnosis present

## 2014-09-21 HISTORY — DX: Dyspnea, unspecified: R06.00

## 2014-09-21 LAB — CBC
HCT: 40.3 % (ref 36.0–46.0)
Hemoglobin: 12.4 g/dL (ref 12.0–15.0)
MCH: 26.4 pg (ref 26.0–34.0)
MCHC: 30.8 g/dL (ref 30.0–36.0)
MCV: 85.9 fL (ref 78.0–100.0)
Platelets: 211 10*3/uL (ref 150–400)
RBC: 4.69 MIL/uL (ref 3.87–5.11)
RDW: 14 % (ref 11.5–15.5)
WBC: 4.6 10*3/uL (ref 4.0–10.5)

## 2014-09-21 LAB — BASIC METABOLIC PANEL
Anion gap: 9 (ref 5–15)
BUN: 14 mg/dL (ref 6–20)
CO2: 28 mmol/L (ref 22–32)
Calcium: 8.5 mg/dL — ABNORMAL LOW (ref 8.9–10.3)
Chloride: 102 mmol/L (ref 101–111)
Creatinine, Ser: 0.68 mg/dL (ref 0.44–1.00)
GFR calc Af Amer: >60 mL/min (ref >60)
GFR calc non Af Amer: >60 mL/min (ref >60)
Glucose, Bld: 107 mg/dL — ABNORMAL HIGH (ref 65–99)
Potassium: 3.9 mmol/L (ref 3.5–5.1)
Sodium: 139 mmol/L (ref 135–145)

## 2014-09-21 MED ORDER — IPRATROPIUM-ALBUTEROL 0.5-2.5 (3) MG/3ML IN SOLN
3.0000 mL | Freq: Four times a day (QID) | RESPIRATORY_TRACT | Status: DC
  Administered 2014-09-22: 3 mL via RESPIRATORY_TRACT
  Filled 2014-09-21: qty 3

## 2014-09-21 MED ORDER — ADULT MULTIVITAMIN W/MINERALS CH
1.0000 | ORAL_TABLET | Freq: Every day | ORAL | Status: DC
  Administered 2014-09-22 – 2014-09-23 (×2): 1 via ORAL
  Filled 2014-09-21 (×2): qty 1

## 2014-09-21 MED ORDER — METHOCARBAMOL 500 MG PO TABS
500.0000 mg | ORAL_TABLET | Freq: Once | ORAL | Status: AC
  Administered 2014-09-21: 500 mg via ORAL
  Filled 2014-09-21: qty 1

## 2014-09-21 MED ORDER — ALBUTEROL SULFATE (2.5 MG/3ML) 0.083% IN NEBU
5.0000 mg | INHALATION_SOLUTION | Freq: Once | RESPIRATORY_TRACT | Status: AC
  Administered 2014-09-21: 5 mg via RESPIRATORY_TRACT
  Filled 2014-09-21: qty 6

## 2014-09-21 MED ORDER — ACETAMINOPHEN 650 MG RE SUPP: 650.0000 mg | Freq: Four times a day (QID) | RECTAL | Status: DC | PRN

## 2014-09-21 MED ORDER — BACLOFEN 10 MG PO TABS
10.0000 mg | ORAL_TABLET | Freq: Two times a day (BID) | ORAL | Status: DC
  Administered 2014-09-22 – 2014-09-23 (×4): 10 mg via ORAL
  Filled 2014-09-21 (×4): qty 1

## 2014-09-21 MED ORDER — ALBUTEROL SULFATE (2.5 MG/3ML) 0.083% IN NEBU: 5.0000 mg | INHALATION_SOLUTION | RESPIRATORY_TRACT | Status: DC | PRN

## 2014-09-21 MED ORDER — OMEGA-3-ACID ETHYL ESTERS 1 G PO CAPS
1.0000 | ORAL_CAPSULE | Freq: Every day | ORAL | Status: DC
  Administered 2014-09-22 – 2014-09-23 (×2): 1 g via ORAL
  Filled 2014-09-21 (×2): qty 1

## 2014-09-21 MED ORDER — ALBUTEROL (5 MG/ML) CONTINUOUS INHALATION SOLN
10.0000 mg/h | INHALATION_SOLUTION | RESPIRATORY_TRACT | Status: AC
  Administered 2014-09-21: 10 mg/h via RESPIRATORY_TRACT
  Filled 2014-09-21: qty 20

## 2014-09-21 MED ORDER — VITAMIN D3 25 MCG (1000 UNIT) PO TABS
2000.0000 [IU] | ORAL_TABLET | Freq: Two times a day (BID) | ORAL | Status: DC
  Administered 2014-09-22 – 2014-09-23 (×4): 2000 [IU] via ORAL
  Filled 2014-09-21 (×9): qty 2

## 2014-09-21 MED ORDER — DIPHENHYDRAMINE HCL 25 MG PO CAPS: 25.0000 mg | ORAL_CAPSULE | Freq: Four times a day (QID) | ORAL | Status: DC | PRN

## 2014-09-21 MED ORDER — IPRATROPIUM BROMIDE 0.02 % IN SOLN
0.5000 mg | Freq: Once | RESPIRATORY_TRACT | Status: AC
  Administered 2014-09-21: 0.5 mg via RESPIRATORY_TRACT
  Filled 2014-09-21: qty 2.5

## 2014-09-21 MED ORDER — METHYLPREDNISOLONE SODIUM SUCC 40 MG IJ SOLR
40.0000 mg | Freq: Four times a day (QID) | INTRAMUSCULAR | Status: DC
  Administered 2014-09-22 (×2): 40 mg via INTRAVENOUS
  Filled 2014-09-21 (×2): qty 1

## 2014-09-21 MED ORDER — PREGABALIN 75 MG PO CAPS
75.0000 mg | ORAL_CAPSULE | Freq: Two times a day (BID) | ORAL | Status: DC
  Administered 2014-09-22 – 2014-09-23 (×4): 75 mg via ORAL
  Filled 2014-09-21 (×4): qty 1

## 2014-09-21 MED ORDER — SODIUM CHLORIDE 0.9 % IJ SOLN
3.0000 mL | Freq: Two times a day (BID) | INTRAMUSCULAR | Status: DC
  Administered 2014-09-22 (×2): 3 mL via INTRAVENOUS

## 2014-09-21 MED ORDER — HYDROCODONE-ACETAMINOPHEN 5-325 MG PO TABS
1.0000 | ORAL_TABLET | Freq: Once | ORAL | Status: AC
Start: 2014-09-21 — End: 2014-09-21
  Administered 2014-09-21: 1 via ORAL
  Filled 2014-09-21: qty 1

## 2014-09-21 MED ORDER — ACETAMINOPHEN 325 MG PO TABS
650.0000 mg | ORAL_TABLET | Freq: Four times a day (QID) | ORAL | Status: DC | PRN
  Administered 2014-09-22 (×2): 650 mg via ORAL
  Filled 2014-09-21 (×2): qty 2

## 2014-09-21 MED ORDER — PANTOPRAZOLE SODIUM 40 MG PO TBEC
40.0000 mg | DELAYED_RELEASE_TABLET | Freq: Every day | ORAL | Status: DC
  Administered 2014-09-22 – 2014-09-23 (×2): 40 mg via ORAL
  Filled 2014-09-21 (×2): qty 1

## 2014-09-21 MED ORDER — FLUTICASONE PROPIONATE 50 MCG/ACT NA SUSP
1.0000 | Freq: Every day | NASAL | Status: DC
  Administered 2014-09-22 – 2014-09-23 (×3): 1 via NASAL
  Filled 2014-09-21: qty 16

## 2014-09-21 MED ORDER — HYDROCODONE-HOMATROPINE 5-1.5 MG/5ML PO SYRP
5.0000 mL | ORAL_SOLUTION | ORAL | Status: DC | PRN
  Administered 2014-09-22: 5 mL via ORAL
  Filled 2014-09-21: qty 5

## 2014-09-21 MED ORDER — TAMOXIFEN CITRATE 10 MG PO TABS
20.0000 mg | ORAL_TABLET | Freq: Every day | ORAL | Status: DC
  Administered 2014-09-22 – 2014-09-23 (×2): 20 mg via ORAL
  Filled 2014-09-21 (×2): qty 2

## 2014-09-21 MED ORDER — SODIUM CHLORIDE 0.9 % IV BOLUS (SEPSIS)
1000.0000 mL | Freq: Once | INTRAVENOUS | Status: AC
  Administered 2014-09-21: 1000 mL via INTRAVENOUS

## 2014-09-21 MED ORDER — LORATADINE 10 MG PO TABS
10.0000 mg | ORAL_TABLET | Freq: Every day | ORAL | Status: DC
  Administered 2014-09-22 – 2014-09-23 (×2): 10 mg via ORAL
  Filled 2014-09-21 (×2): qty 1

## 2014-09-21 MED ORDER — SENNA 8.6 MG PO TABS
1.0000 | ORAL_TABLET | ORAL | Status: DC
  Administered 2014-09-22: 8.6 mg via ORAL
  Filled 2014-09-21: qty 1

## 2014-09-21 MED ORDER — LISINOPRIL 10 MG PO TABS
10.0000 mg | ORAL_TABLET | Freq: Every day | ORAL | Status: DC
  Administered 2014-09-22 – 2014-09-23 (×2): 10 mg via ORAL
  Filled 2014-09-21 (×2): qty 1

## 2014-09-21 MED ORDER — PREDNISONE 20 MG PO TABS
60.0000 mg | ORAL_TABLET | Freq: Once | ORAL | Status: AC
  Administered 2014-09-21: 60 mg via ORAL
  Filled 2014-09-21: qty 3

## 2014-09-21 MED ORDER — ENOXAPARIN SODIUM 40 MG/0.4ML ~~LOC~~ SOLN
40.0000 mg | Freq: Every day | SUBCUTANEOUS | Status: DC
  Administered 2014-09-22 (×2): 40 mg via SUBCUTANEOUS
  Filled 2014-09-21 (×2): qty 0.4

## 2014-09-21 MED ORDER — GUAIFENESIN ER 600 MG PO TB12
600.0000 mg | ORAL_TABLET | Freq: Two times a day (BID) | ORAL | Status: DC
  Administered 2014-09-22 – 2014-09-23 (×4): 600 mg via ORAL
  Filled 2014-09-21 (×4): qty 1

## 2014-09-21 NOTE — ED Notes (Signed)
Pt c/o dry cough, fevers, body aches x week. Pt states that she has been taking Mucinex, ibuprofen, cough medicine but not helping with her symptoms.

## 2014-09-21 NOTE — ED Notes (Signed)
[employer] controller notified of patient's delay due to continuous neb administration.

## 2014-09-21 NOTE — ED Notes (Signed)
Case management at bedside.

## 2014-09-21 NOTE — Progress Notes (Addendum)
[facility] consulted for medication assistance.  Patient confirms she has [employer].  EDCM spoke to patient at bedside.  Patient reports she does not have any difficulty affording her medications.  Patient reports she would like to come off Xanax.  Patient reports she has been on Xanax " a while."  She as taking 1mg  but it was changed to 0.5mg .  Patient reports when the dosage was changed, the price went up.  Patient reports she stopped taking nortriptyline without telling her doctor because it as giving her migraine headaches.  Patient also reports she would also like to change the lisinopril she is on because, "The dose is to high."  Patient also stated her prescription for fiorinal was filled for 48 day supply instead of 90 day supply which may it more expensive per patient.

## 2014-09-21 NOTE — ED Notes (Signed)
Ambulated to bathroom with one assist.  

## 2014-09-21 NOTE — ED Provider Notes (Signed)
CSN: [id number]     Arrival date & time 09/21/14  1504 History   First MD Initiated Contact with Patient 09/21/14 1649     Chief Complaint  Patient presents with  . Cough  . Fever     (Consider location/radiation/quality/duration/timing/severity/associated sxs/prior Treatment) Patient is a 54 y.o. female presenting with cough and fever. The history is provided by the patient and medical records. No language interpreter was used.  Cough Associated symptoms: chest pain (only with coughing), headaches, rhinorrhea, shortness of breath, sore throat and wheezing   Associated symptoms: no chills, no ear pain, no fever, no myalgias and no rash   Fever Associated symptoms: chest pain (only with coughing), congestion, cough, headaches, rhinorrhea and sore throat   Associated symptoms: no chills, no diarrhea, no dysuria, no ear pain, no myalgias, no nausea, no rash and no vomiting     Ashley Lindsey is a 54 y.o. female  with a hx of bilateral breast cancer 2009 and 2012, fibromyalgia migraines presents to the Emergency Department complaining of gradual, persistent, progressively worsening URI symptoms onset 5 days ago.  Associated symptoms include low-grade fever at night, shortness of breath, cough, body aches, rhinorrhea, sore throat, postnasal drip. Patient reports measured fever in the evenings to 100.6. Patient also complains of chest pain present only when coughing and with deep breaths. No chest pain with walking or exertion.  Patient reports she is a former smoker and quit smoking cold [place] several weeks ago. She reports her biggest concern is her associated and persistent shortness of breath. She has no history of asthma and has only used albuterol inhaler one time before after diagnosis of bronchitis. Patient denies recent travel, exogernous estrogen usage, history of blood clots, swelling in her legs, no recent surgeries or fractures.  No personal or reactive family history. Pt denies neck  pain, neck stiffness, abdominal pain, nausea, vomiting, diarrhea, syncope, dysuria..     Past Medical History  Diagnosis Date  . Abdominal cramps   . Muscle spasms of head and/or neck     following to the breast  . Fatigue   . Passed out     4th of july  . Migraines   . Chronic headaches   . Arthritis   . Cancer     s/p radiation- last dose 6/12//has been off Tamoxifen 8/12  . Sleep apnea     STOP BANG SCORE 5  . Lymphedema     RIGHT ARM-////  STATES USE LEFT ARM FOR BP'S  . S/P radiation therapy 08/19/07 - 10/10/07    Left Breast/5040 cGy/28 fractions with Boost for a toatl dose of 6300 dGy  . S/P radiation therapy 08/14/10 -10/03/10    right breast  . Costochondritis   . Fibromyalgia 03/06/2013   Past Surgical History  Procedure Laterality Date  . Breast lumpectomy  2009/2012    LEFT/RIGHT with lymph node dissection  . Abdominal hysterectomy      with left salpingooophorectomy  . Knee arthroscopy      right  . Oophorectomy  2013   Family History  Problem Relation Age of Onset  . Breast cancer Mother 56  . Heart disease Father   . Prostate cancer Maternal Uncle 78  . Cancer Maternal Aunt 63    d. from "female cancer" possibly cervical  . Prostate cancer Paternal Uncle   . Prostate cancer Maternal Grandfather 28  . Prostate cancer Maternal Uncle 80  . Prostate cancer Paternal Uncle   . Breast cancer  Cousin 30    mat 1st cousin   History  Substance Use Topics  . Smoking status: Current Every Day Smoker -- 0.25 packs/day for 1 years    Types: Cigarettes  . Smokeless tobacco: Never Used  . Alcohol Use: Yes     Comment: 3 x a week   OB History    No data available     Review of Systems  Constitutional: Positive for fatigue. Negative for fever, chills and appetite change.  HENT: Positive for congestion, postnasal drip, rhinorrhea, sinus pressure and sore throat. Negative for ear discharge, ear pain and mouth sores.   Eyes: Negative for visual disturbance.   Respiratory: Positive for cough, chest tightness, shortness of breath and wheezing. Negative for stridor.   Cardiovascular: Positive for chest pain (only with coughing). Negative for palpitations and leg swelling.  Gastrointestinal: Negative for nausea, vomiting, abdominal pain and diarrhea.  Genitourinary: Negative for dysuria, urgency, frequency and hematuria.  Musculoskeletal: Negative for myalgias, back pain, arthralgias and neck stiffness.  Skin: Negative for rash.  Neurological: Positive for headaches. Negative for syncope, light-headedness and numbness.  Hematological: Negative for adenopathy.  Psychiatric/Behavioral: The patient is not nervous/anxious.   All other systems reviewed and are negative.     Allergies  Aspirin and Penicillins  Home Medications   Prior to Admission medications   Medication Sig Start Date End Date Taking? Authorizing Provider  aspirin-acetaminophen-caffeine (EXCEDRIN MIGRAINE) [phone] MG per tablet Take 1 tablet by mouth every 6 (six) hours as needed for migraine (migraine).   Yes Historical Provider, MD  butalbital-aspirin-caffeine [facility]) 50-325-40 MG per capsule Take 1 capsule by mouth 3 (three) times daily as needed for pain (migraine). For headache   Yes Historical Provider, MD  cholecalciferol (VITAMIN D) 1000 UNITS tablet Take 2,000 Units by mouth 2 (two) times daily.   Yes Historical Provider, MD  cyclobenzaprine (FLEXERIL) 10 MG tablet Take 10 mg by mouth 3 (three) times daily as needed for muscle spasms (muscle spasms).    Yes Historical Provider, MD  dextromethorphan-guaiFENesin (MUCINEX DM) 30-600 MG per 12 hr tablet Take 1 tablet by mouth 2 (two) times daily as needed for cough (cough).   Yes Historical Provider, MD  diphenhydrAMINE (BENADRYL) 25 MG tablet Take 25 mg by mouth every 6 (six) hours as needed for allergies (allergies).   Yes Historical Provider, MD  fish oil-omega-3 fatty acids 1000 MG capsule Take 1 capsule by mouth daily  with breakfast.    Yes Historical Provider, MD  ibuprofen (ADVIL,MOTRIN) 800 MG tablet Take 800 mg by mouth every 8 (eight) hours as needed for moderate pain (pain). For pain   Yes Historical Provider, MD  lisinopril (PRINIVIL,ZESTRIL) 10 MG tablet Take 10 mg by mouth daily.   Yes Historical Provider, MD  Multiple Vitamins-Minerals (MULTIVITAMIN & MINERAL PO) Take 1 tablet by mouth daily.   Yes Historical Provider, MD  omeprazole (PRILOSEC) 40 MG capsule Take 40 mg by mouth daily.  06/15/13  Yes Historical Provider, MD  predniSONE (DELTASONE) 1 MG tablet Take 1 mg by mouth at bedtime.   Yes Historical Provider, MD  pregabalin (LYRICA) 75 MG capsule Take 1 capsule (75 mg total) by mouth 2 (two) times daily. 03/14/12  Yes Eston Esters, MD  senna (SENOKOT) 8.6 MG tablet Take 1 tablet by mouth every other day.    Yes Historical Provider, MD  tamoxifen (NOLVADEX) 20 MG tablet TAKE 1 TABLET (20 MG TOTAL) BY MOUTH DAILY. 07/19/14  Yes Nicholas Lose, MD  baclofen (  LIORESAL) 10 MG tablet Take 10 mg by mouth 2 (two) times daily.  08/30/14   Historical Provider, MD  hydrOXYzine (ATARAX/VISTARIL) 25 MG tablet Take 1 tablet (25 mg total) by mouth every 8 (eight) hours as needed for itching. Patient not taking: Reported on 09/21/2014 05/01/14   Lutricia Feil, PA  predniSONE (DELTASONE) 10 MG tablet Beginning 05/02/2014, take 3 tabs po QD day 1, 2 tabs po QD day 2, 1 tab po QD day 3 then stop Patient not taking: Reported on 09/21/2014 05/01/14   Audelia Hives Presson, PA  UNABLE TO FIND Rx: L8000-Post Surgical Bra (Quantity: 6) Z6109- Silicone Breast Prosthesis (Quantity: 2) Dx: 174.9; Bilateral partial mastectomy Replacement due to hot flashes since previous items were too heavy and hot 01/20/13   Fanny Skates, MD   BP 129/85 mmHg  Pulse 100  Temp(Src) 99.1 F (37.3 C) (Rectal)  Resp 19  Ht 5\' 3"  (1.6 m)  SpO2 95% Physical Exam  Constitutional: She is oriented to person, place, and time. She appears  well-developed and well-nourished. No distress.  HENT:  Head: Normocephalic and atraumatic.  Right Ear: Tympanic membrane, external ear and ear canal normal.  Left Ear: Tympanic membrane, external ear and ear canal normal.  Nose: Mucosal edema and rhinorrhea present. No epistaxis. Right sinus exhibits no maxillary sinus tenderness and no frontal sinus tenderness. Left sinus exhibits no maxillary sinus tenderness and no frontal sinus tenderness.  Mouth/Throat: Uvula is midline and mucous membranes are normal. Mucous membranes are not pale and not cyanotic. No oropharyngeal exudate, posterior oropharyngeal edema, posterior oropharyngeal erythema or tonsillar abscesses.  Eyes: Conjunctivae are normal. Pupils are equal, round, and reactive to light.  Neck: Normal range of motion and full passive range of motion without pain.  Cardiovascular: Intact distal pulses.  Tachycardia present.   Pulses:      Radial pulses are 2+ on the right side, and 2+ on the left side.       Dorsalis pedis pulses are 2+ on the right side, and 2+ on the left side.       Posterior tibial pulses are 2+ on the right side, and 2+ on the left side.  Mild tachycardia  Pulmonary/Chest: Accessory muscle usage present. No stridor. Tachypnea noted. She has decreased breath sounds. She has wheezes. She exhibits tenderness (TTP of the anterior chest).  Diminished breath sounds throughout with tachypnea, accessory muscle usage and inspiration and expiratory wheezes  Abdominal: Soft. Bowel sounds are normal. There is no tenderness.  Musculoskeletal: Normal range of motion.  Lymphadenopathy:    She has no cervical adenopathy.  Neurological: She is alert and oriented to person, place, and time.  Skin: Skin is warm and dry. No rash noted. She is not diaphoretic.  Psychiatric: She has a normal mood and affect.  Nursing note and vitals reviewed.   ED Course  Procedures (including critical care time) Labs Review Labs Reviewed  BASIC  METABOLIC PANEL - Abnormal; Notable for the following:    Glucose, Bld 107 (*)    Calcium 8.5 (*)    All other components within normal limits  CBC    Imaging Review Dg Chest 2 View  09/21/2014   CLINICAL DATA:  Cough and fever for 1 week  EXAM: CHEST  2 VIEW  COMPARISON:  January 06, 2013 chest radiograph and chest CT  FINDINGS: There is no edema or consolidation. The heart size and pulmonary vascularity are normal. No adenopathy. There is degenerative change in  the thoracic spine.  IMPRESSION: No edema or consolidation.   Electronically Signed   By: Lowella Grip III M.D.   On: 09/21/2014 16:08     EKG Interpretation None      MDM   Final diagnoses:  URI, acute  Bronchospasm  Hypoxia   Ashley Lindsey is with URI symptoms and low-grade fever at home. Patient afebrile here in the emergency department. Mild tachycardia. She is to With severely diminished breath sounds throughout. We'll check labs, give albuterol and reassess.  7:30PM She continues to have shortness of breath and wheezing. Will repeat albuterol and reassess. Labs reassuring.  She continues to complain of chest tightness.  8:30 PM Patient continues to have shortness of breath, accessory muscle usage and tachypnea. While ambulating she became severely short of breath and option saturations dropped to 84%.  Visual needed admission for bronchospasm.  8:39 PM Pt discussed with Dr. Carlis Abbott who will admit to tele.    BP 129/85 mmHg  Pulse 100  Temp(Src) 99.1 F (37.3 C) (Rectal)  Resp 19  Ht 5\' 3"  (1.6 m)  SpO2 95%   Abigail Butts, PA-C 09/21/14 2041  Lacretia Leigh, MD 09/21/14 2314

## 2014-09-21 NOTE — H&P (Signed)
[facility] History and Physical  Ashley Lindsey CVE:[id number] DOB: 1960/05/03 DOA: 09/21/2014  PCP: [facility] Specialists: Dr. Nicholas Lose, oncologist  Chief Complaint: Cough and shortness of breath with wheezing  HPI: Ashley Lindsey is a 54 y.o. breast cancer survivor with a history of tobacco use, migraine headaches, and arthritis who presented to the ED for evaluation of cough and shortness of breath.  The patient reports approximately one week of wet cough though she has not been successful in producing any phlegm with associated chest and abdominal soreness and increased wheezing.  She reports fever to 101 at home.  She denies any known sick contacts.  She has had light headedness but no syncope.  She has had post nasal drip and nasal congestion.  She has been managing her symptoms with OTC mucinex DM, without relief.  Her shortness of breath and wheezing prompted her to come in today.  She has experienced some improvement with nebulizer treatments and oral prednisone, but the ED physician documented oxygen desaturations to the mid 80's with exertion.  Hospitalist asked to admit for further observation and evaluation.  Patient accompanied by her daugther, who reports recent trip to [place] for Mother's Day.  Since then, the patient feels that she has had increased pain in both legs and swelling that has primarily involved her feet.  This typically resolves with elevation.  Review of Systems: 10 systems reviewed and negative except as stated in HPI.   Past Medical History  Diagnosis Date  . Abdominal cramps   . Muscle spasms of head and/or neck     following to the breast  . Fatigue   . Passed out     4th of july  . Migraines   . Chronic headaches   . Arthritis   . Cancer     s/p radiation- last dose 6/12//has been off Tamoxifen 8/12  . Sleep apnea     STOP BANG SCORE 5  . Lymphedema     RIGHT ARM-////  STATES USE LEFT ARM FOR BP'S  . S/P radiation  therapy 08/19/07 - 10/10/07    Left Breast/5040 cGy/28 fractions with Boost for a toatl dose of 6300 dGy  . S/P radiation therapy 08/14/10 -10/03/10    right breast  . Costochondritis   . Fibromyalgia 03/06/2013   Past Surgical History  Procedure Laterality Date  . Breast lumpectomy  2009/2012    LEFT/RIGHT with lymph node dissection  . Abdominal hysterectomy      with left salpingooophorectomy  . Knee arthroscopy      right  . Oophorectomy  2013   Social History:  History   Social History Narrative  She is divorced.  She has one daughter.  History of tobacco use.  She had actually stopped smoking for 14 years before resuming just over one year ago.  She now reports that her last cigarette was two weeks ago.  Occasional/social EtOH use but no history of binge drinking or EtOH withdrawal.  No illicit drug use.  Allergies  Allergen Reactions  . Aspirin     unknown  . Penicillins Hives and Swelling    Family History  Problem Relation Age of Onset  . Breast cancer Mother 100  . Heart disease Father   . Prostate cancer Maternal Uncle 78  . Cancer Maternal Aunt 63    d. from "female cancer" possibly cervical  . Prostate cancer Paternal Uncle   . Prostate cancer Maternal Grandfather 30  . Prostate  cancer Maternal Uncle 80  . Prostate cancer Paternal Uncle   . Breast cancer Cousin 54    mat 1st cousin   Prior to Admission medications   Medication Sig Start Date End Date Taking? Authorizing Provider  aspirin-acetaminophen-caffeine (EXCEDRIN MIGRAINE) [phone] MG per tablet Take 1 tablet by mouth every 6 (six) hours as needed for migraine (migraine).   Yes Historical Provider, MD  butalbital-aspirin-caffeine [facility]) 50-325-40 MG per capsule Take 1 capsule by mouth 3 (three) times daily as needed for pain (migraine). For headache   Yes Historical Provider, MD  cholecalciferol (VITAMIN D) 1000 UNITS tablet Take 2,000 Units by mouth 2 (two) times daily.   Yes Historical Provider,  MD  cyclobenzaprine (FLEXERIL) 10 MG tablet Take 10 mg by mouth 3 (three) times daily as needed for muscle spasms (muscle spasms).    Yes Historical Provider, MD  dextromethorphan-guaiFENesin (MUCINEX DM) 30-600 MG per 12 hr tablet Take 1 tablet by mouth 2 (two) times daily as needed for cough (cough).   Yes Historical Provider, MD  diphenhydrAMINE (BENADRYL) 25 MG tablet Take 25 mg by mouth every 6 (six) hours as needed for allergies (allergies).   Yes Historical Provider, MD  fish oil-omega-3 fatty acids 1000 MG capsule Take 1 capsule by mouth daily with breakfast.    Yes Historical Provider, MD  ibuprofen (ADVIL,MOTRIN) 800 MG tablet Take 800 mg by mouth every 8 (eight) hours as needed for moderate pain (pain). For pain   Yes Historical Provider, MD  lisinopril (PRINIVIL,ZESTRIL) 10 MG tablet Take 10 mg by mouth daily.   Yes Historical Provider, MD  Multiple Vitamins-Minerals (MULTIVITAMIN & MINERAL PO) Take 1 tablet by mouth daily.   Yes Historical Provider, MD  omeprazole (PRILOSEC) 40 MG capsule Take 40 mg by mouth daily.  06/15/13  Yes Historical Provider, MD  predniSONE (DELTASONE) 1 MG tablet Take 1 mg by mouth at bedtime.   Yes Historical Provider, MD  pregabalin (LYRICA) 75 MG capsule Take 1 capsule (75 mg total) by mouth 2 (two) times daily. 03/14/12  Yes Eston Esters, MD  senna (SENOKOT) 8.6 MG tablet Take 1 tablet by mouth every other day.    Yes Historical Provider, MD  tamoxifen (NOLVADEX) 20 MG tablet TAKE 1 TABLET (20 MG TOTAL) BY MOUTH DAILY. 07/19/14  Yes Nicholas Lose, MD  baclofen (LIORESAL) 10 MG tablet Take 10 mg by mouth 2 (two) times daily.  08/30/14   Historical Provider, MD  hydrOXYzine (ATARAX/VISTARIL) 25 MG tablet Take 1 tablet (25 mg total) by mouth every 8 (eight) hours as needed for itching. Patient not taking: Reported on 09/21/2014 05/01/14   Lutricia Feil, PA  predniSONE (DELTASONE) 10 MG tablet Beginning 05/02/2014, take 3 tabs po QD day 1, 2 tabs po QD day 2, 1  tab po QD day 3 then stop Patient not taking: Reported on 09/21/2014 05/01/14   Audelia Hives Presson, PA  UNABLE TO FIND Rx: L8000-Post Surgical Bra (Quantity: 6) U1324- Silicone Breast Prosthesis (Quantity: 2) Dx: 174.9; Bilateral partial mastectomy Replacement due to hot flashes since previous items were too heavy and hot 01/20/13   Fanny Skates, MD   Physical Exam: Filed Vitals:   09/21/14 1712 09/21/14 1918 09/21/14 2044 09/21/14 2129  BP:   123/76   Pulse:   96   Temp: 99.1 F (37.3 C)  98 F (36.7 C)   TempSrc: Rectal  Oral   Resp:   19   Height:      SpO2:  95% 93% 95%    General:  Awake and alert but ill appearing.  Looks tired but coherent and oriented x 4.  ENT: Mucous membranes slightly dry.  Neck: Supple.  Mild acanthosis on posterior neck.  Cardiovascular: Mildly tachycardic but regular.  No significant pitting in lower extremities.  Respiratory: Coarse bilaterally.  Abdomen: Soft/NT/ND.  Bowel sounds are hypoactive.  No guarding.  Skin: Warm and dry.  Musculoskeletal: Moves all four extremities spontaneously. + calf tenderness on the left but no palpable cord.  Psychiatric: Normal affect.  Neurologic: No focal deficits.  Labs on Admission:  Basic Metabolic Panel:  Recent Labs Lab 09/21/14 1752  NA 139  K 3.9  CL 102  CO2 28  GLUCOSE 107*  BUN 14  CREATININE 0.68  CALCIUM 8.5*   CBC:  Recent Labs Lab 09/21/14 1752  WBC 4.6  HGB 12.4  HCT 40.3  MCV 85.9  PLT 211   Radiological Exams on Admission: Dg Chest 2 View  09/21/2014   CLINICAL DATA:  Cough and fever for 1 week  EXAM: CHEST  2 VIEW  COMPARISON:  January 06, 2013 chest radiograph and chest CT  FINDINGS: There is no edema or consolidation. The heart size and pulmonary vascularity are normal. No adenopathy. There is degenerative change in the thoracic spine.  IMPRESSION: No edema or consolidation.   Electronically Signed   By: Lowella Grip III M.D.   On: 09/21/2014 16:08     EKG: Independently reviewed. No acute ST segment changes  Assessment/Plan Principal Problem:   Acute bronchitis Active Problems:   Bronchospasm   Hypoxia   Dyspnea   Leg pain   1.  Admit for observation, telemetry  2.  Acute bronchitis with URI NOS, could certainly have a component of allergic rhinitis, though hypoxia is concerning --IV solumedrol --Duonebs four times daily; albuterol PRN for breakthrough SOB or wheezing --Start claritin and flonase --Cough suppressant as needed --Mucinex 600mg  BID --Incentive spirometer q1h while awake --Will continue to hold on antibiotics for now unless she spikes a fever greater than 100.4  3.  Leg pain --Bilateral lower extremity ultrasound to rule out DVT.  If positive, indication for anticoagulation will be confirmed.  If negative, will still need to consider CTA chest PE protocol due to hypoxia.  Patient is in agreement with this plan.  D-Dimer unlikely to be helpful at this point because she has a history of malignancy.  DVT prophylactic dose of lovenox for now.  Code Status:FULL  Family Communication: Daughter at bedside Disposition Plan: 1-2 days, depending on response to therapy  Time spent: 70 minutes  [employer] [facility]  09/21/2014, 10:53 PM

## 2014-09-21 NOTE — ED Notes (Signed)
Hospitalist at bedside 

## 2014-09-22 ENCOUNTER — Observation Stay (HOSPITAL_COMMUNITY): Payer: Commercial Managed Care - HMO

## 2014-09-22 DIAGNOSIS — R0600 Dyspnea, unspecified: Secondary | ICD-10-CM | POA: Diagnosis not present

## 2014-09-22 DIAGNOSIS — J101 Influenza due to other identified influenza virus with other respiratory manifestations: Secondary | ICD-10-CM

## 2014-09-22 DIAGNOSIS — R0602 Shortness of breath: Secondary | ICD-10-CM

## 2014-09-22 DIAGNOSIS — R0902 Hypoxemia: Secondary | ICD-10-CM

## 2014-09-22 DIAGNOSIS — M79605 Pain in left leg: Secondary | ICD-10-CM

## 2014-09-22 DIAGNOSIS — J9801 Acute bronchospasm: Secondary | ICD-10-CM | POA: Diagnosis not present

## 2014-09-22 DIAGNOSIS — J209 Acute bronchitis, unspecified: Secondary | ICD-10-CM

## 2014-09-22 DIAGNOSIS — J111 Influenza due to unidentified influenza virus with other respiratory manifestations: Secondary | ICD-10-CM | POA: Diagnosis not present

## 2014-09-22 LAB — BASIC METABOLIC PANEL
Anion gap: 11 (ref 5–15)
BUN: 9 mg/dL (ref 6–20)
CO2: 25 mmol/L (ref 22–32)
Calcium: 8.7 mg/dL — ABNORMAL LOW (ref 8.9–10.3)
Chloride: 104 mmol/L (ref 101–111)
Creatinine, Ser: 0.62 mg/dL (ref 0.44–1.00)
GFR calc Af Amer: >60 mL/min (ref >60)
GFR calc non Af Amer: >60 mL/min (ref >60)
Glucose, Bld: 185 mg/dL — ABNORMAL HIGH (ref 65–99)
Potassium: 4 mmol/L (ref 3.5–5.1)
Sodium: 140 mmol/L (ref 135–145)

## 2014-09-22 LAB — INFLUENZA PANEL BY PCR (TYPE A & B)
H1N1 flu by pcr: NOT DETECTED
Influenza A By PCR: NEGATIVE
Influenza B By PCR: POSITIVE — AB

## 2014-09-22 LAB — CBC
HCT: 38.9 % (ref 36.0–46.0)
Hemoglobin: 11.9 g/dL — ABNORMAL LOW (ref 12.0–15.0)
MCH: 26.7 pg (ref 26.0–34.0)
MCHC: 30.6 g/dL (ref 30.0–36.0)
MCV: 87.2 fL (ref 78.0–100.0)
Platelets: 224 10*3/uL (ref 150–400)
RBC: 4.46 MIL/uL (ref 3.87–5.11)
RDW: 14.1 % (ref 11.5–15.5)
WBC: 4.1 10*3/uL (ref 4.0–10.5)

## 2014-09-22 MED ORDER — BUTALBITAL-APAP-CAFFEINE 50-325-40 MG PO TABS
1.0000 | ORAL_TABLET | Freq: Three times a day (TID) | ORAL | Status: DC | PRN
  Administered 2014-09-22: 1 via ORAL
  Filled 2014-09-22: qty 1

## 2014-09-22 MED ORDER — HYDROCOD POLST-CPM POLST ER 10-8 MG/5ML PO SUER
5.0000 mL | Freq: Once | ORAL | Status: AC
  Administered 2014-09-22: 5 mL via ORAL
  Filled 2014-09-22: qty 5

## 2014-09-22 MED ORDER — MENTHOL 3 MG MT LOZG
1.0000 | LOZENGE | OROMUCOSAL | Status: DC | PRN
  Administered 2014-09-22: 3 mg via ORAL
  Filled 2014-09-22: qty 9

## 2014-09-22 MED ORDER — IPRATROPIUM-ALBUTEROL 0.5-2.5 (3) MG/3ML IN SOLN
3.0000 mL | Freq: Two times a day (BID) | RESPIRATORY_TRACT | Status: DC
  Administered 2014-09-22 – 2014-09-23 (×2): 3 mL via RESPIRATORY_TRACT
  Filled 2014-09-22 (×2): qty 3

## 2014-09-22 MED ORDER — PREDNISONE 20 MG PO TABS
40.0000 mg | ORAL_TABLET | Freq: Every day | ORAL | Status: DC
  Administered 2014-09-23: 40 mg via ORAL
  Filled 2014-09-22: qty 2

## 2014-09-22 MED ORDER — OSELTAMIVIR PHOSPHATE 75 MG PO CAPS
75.0000 mg | ORAL_CAPSULE | Freq: Two times a day (BID) | ORAL | Status: DC
Start: 2014-09-22 — End: 2014-09-23
  Administered 2014-09-22 – 2014-09-23 (×3): 75 mg via ORAL
  Filled 2014-09-22 (×4): qty 1

## 2014-09-22 MED ORDER — GUAIFENESIN-DM 100-10 MG/5ML PO SYRP
5.0000 mL | ORAL_SOLUTION | ORAL | Status: DC | PRN
  Administered 2014-09-22 – 2014-09-23 (×3): 5 mL via ORAL
  Filled 2014-09-22 (×3): qty 10

## 2014-09-22 NOTE — Progress Notes (Signed)
Progress Note   Ashley Lindsey GNF:[id number] DOB: 10-Nov-1960 DOA: 09/21/2014 PCP: Maximino Greenland, MD   Brief Narrative:   Ashley Lindsey is an 54 y.o. female with a PMH of breast cancer, tobacco abuse, migraine headaches and osteoarthritis who was admitted 09/21/14 with cough, myalgias, and shortness of breath/fever. She also had oxygen desaturations into the mid 80s. Influenza test positive for B strain.  Assessment/Plan:   Principal Problem:   Acute Influenzal bronchitis with bronchospasm secondary to influenza B - Start Tamiflu given influenza B test positive. - Wean Solu-Medrol to prednisone. - Provide symptomatic care with antitussives, anti-histamines, throat lozenges.  Active Problems:   Acute hypoxic respiratory failure / dyspnea - Secondary to #1. Lower extremity Dopplers negative. Doubt PE.    Leg pain - Lower extremity Dopplers negative for PE.    Migraine headache  - Resume Fioricet.    History of breast cancer  - Continue tamoxifen. - Status post left breast lumpectomy, adjuvant radiation.    Fibromyalgia  - Continue muscle relaxers. Continue Lyrica.    DVT Prophylaxis - SCDs ordered.  Code Status: Full. Family Communication: Daughter at the bedside. Disposition Plan: Home 09/23/14 if stable.   IV Access:    Peripheral IV   Procedures and diagnostic studies:   Dg Chest 2 View  09/21/2014   CLINICAL DATA:  Cough and fever for 1 week  EXAM: CHEST  2 VIEW  COMPARISON:  January 06, 2013 chest radiograph and chest CT  FINDINGS: There is no edema or consolidation. The heart size and pulmonary vascularity are normal. No adenopathy. There is degenerative change in the thoracic spine.  IMPRESSION: No edema or consolidation.   Electronically Signed   By: Lowella Grip III M.D.   On: 09/21/2014 16:08     Medical Consultants:    None.  Anti-Infectives:    Tamiflu 09/22/14--->  Subjective:   Ashley Lindsey still has a non-productive cough,  throat pain, myalgias, and nausea.  Vomited yesterday.  Dyspneic.  Has some chest tightness.  Objective:    Filed Vitals:   09/21/14 2129 09/21/14 2335 09/22/14 0449 09/22/14 1530  BP:  121/71 121/73 113/56  Pulse:  104 95 91  Temp:  98.4 F (36.9 C) 99.1 F (37.3 C)   TempSrc:  Oral Oral   Resp:  20 20 20   Height:  5\' 2"  (1.575 m)    Weight:  112.946 kg (249 lb)    SpO2: 95% 95% 95% 97%    Intake/Output Summary (Last 24 hours) at 09/22/14 1613 Last data filed at 09/22/14 1531  Gross per 24 hour  Intake    480 ml  Output      0 ml  Net    480 ml    Exam: Gen:  NAD Cardiovascular:  RRR, No M/R/G Respiratory:  Lungs CTAB Gastrointestinal:  Abdomen soft, NT/ND, + BS Extremities:  No C/E/C   Data Reviewed:    Labs: Basic Metabolic Panel:  Recent Labs Lab 09/21/14 1752 09/22/14 0435  NA 139 140  K 3.9 4.0  CL 102 104  CO2 28 25  GLUCOSE 107* 185*  BUN 14 9  CREATININE 0.68 0.62  CALCIUM 8.5* 8.7*   GFR Estimated Creatinine Clearance: 94.3 mL/min (by C-G formula based on Cr of 0.62). Liver Function Tests: No results for input(s): AST, ALT, ALKPHOS, BILITOT, PROT, ALBUMIN in the last 168 hours. No results for input(s): LIPASE, AMYLASE in the last 168 hours. No results for input(s):  AMMONIA in the last 168 hours. Coagulation profile No results for input(s): INR, PROTIME in the last 168 hours.  CBC:  Recent Labs Lab 09/21/14 1752 09/22/14 0435  WBC 4.6 4.1  HGB 12.4 11.9*  HCT 40.3 38.9  MCV 85.9 87.2  PLT 211 224    Microbiology No results found for this or any previous visit (from the past 240 hour(s)).   Medications:   . baclofen  10 mg Oral BID  . cholecalciferol  2,000 Units Oral BID  . enoxaparin (LOVENOX) injection  40 mg Subcutaneous QHS  . fluticasone  1 spray Each Nare Daily  . guaiFENesin  600 mg Oral BID  . ipratropium-albuterol  3 mL Nebulization BID  . lisinopril  10 mg Oral Daily  . loratadine  10 mg Oral Daily  .  multivitamin with minerals  1 tablet Oral Daily  . omega-3 acid ethyl esters  1 capsule Oral Q breakfast  . oseltamivir  75 mg Oral BID  . pantoprazole  40 mg Oral Daily  . [START ON 09/23/2014] predniSONE  40 mg Oral QAC breakfast  . pregabalin  75 mg Oral BID  . senna  1 tablet Oral QODAY  . sodium chloride  3 mL Intravenous Q12H  . tamoxifen  20 mg Oral Daily   Continuous Infusions:   Time spent: 25 minutes.     [facility] Pager [phone]. If unable to reach me by pager, please call my cell phone at [phone].  *Please refer to [email or web address], password [email or web address] to get updated schedule on who will round on this patient, as hospitalists switch teams weekly. If 7PM-7AM, please contact night-coverage at [email or web address], password [email or web address] for any overnight needs.  09/22/2014, 4:13 PM

## 2014-09-22 NOTE — Progress Notes (Signed)
Bilateral lower extremity venous duplex completed:  No obvious evidence of DVT, superficial thrombosis, or Baker's cyst.  Technically difficult study due to the patient's body habitus.   

## 2014-09-23 DIAGNOSIS — J111 Influenza due to unidentified influenza virus with other respiratory manifestations: Secondary | ICD-10-CM | POA: Diagnosis not present

## 2014-09-23 DIAGNOSIS — J209 Acute bronchitis, unspecified: Secondary | ICD-10-CM | POA: Diagnosis not present

## 2014-09-23 DIAGNOSIS — R0600 Dyspnea, unspecified: Secondary | ICD-10-CM | POA: Diagnosis not present

## 2014-09-23 DIAGNOSIS — J9801 Acute bronchospasm: Secondary | ICD-10-CM | POA: Diagnosis not present

## 2014-09-23 MED ORDER — OSELTAMIVIR PHOSPHATE 75 MG PO CAPS: 75.0000 mg | ORAL_CAPSULE | Freq: Two times a day (BID) | ORAL | Status: DC

## 2014-09-23 NOTE — Discharge Summary (Signed)
Physician Discharge Summary  Ashley Lindsey [id number]:[id number] DOB: 1960/12/30 DOA: 09/21/2014  PCP: Maximino Greenland, MD  Admit date: 09/21/2014 Discharge date: 09/23/2014   Recommendations for Outpatient Follow-Up:   1. F/U with PCP for worsening symptoms.   Discharge Diagnosis:   Principal Problem:    Influenzal bronchitis Active Problems:    Bronchospasm    Acute bronchitis    Hypoxia    Dyspnea    Leg pain    Influenza B   Discharge disposition:  Home.    Discharge Condition: Improved.  Diet recommendation: Low sodium, heart healthy.    History of Present Illness:   Ashley Lindsey is an 54 y.o. female with a PMH of breast cancer, tobacco abuse, migraine headaches and osteoarthritis who was admitted 09/21/14 with cough, myalgias, and shortness of breath/fever. She also had oxygen desaturations into the mid 80s. Influenza test positive for B strain.   Hospital Course by Problem:   Principal Problem:  Acute Influenzal bronchitis with bronchospasm secondary to influenza B - Continue Tamiflu for a total course of 5 days of therapy. - Continue home dose of prednisone.  - Continue symptomatic care with antitussives, anti-histamines, throat lozenges.  Active Problems:  Acute hypoxic respiratory failure / dyspnea - Secondary to #1. Lower extremity Dopplers negative. Doubt PE.   Leg pain - Lower extremity Dopplers negative for PE.   Migraine headache  - Continue Fioricet.   History of breast cancer  - Continue tamoxifen. - Status post left breast lumpectomy, adjuvant radiation.   Fibromyalgia  - Continue muscle relaxers. Continue Lyrica.    Medical Consultants:    None.   Discharge Exam:   Filed Vitals:   09/23/14 0515  BP: 124/73  Pulse: 81  Temp: 98.5 F (36.9 C)  Resp: 18   Filed Vitals:   09/22/14 1939 09/22/14 2054 09/23/14 0515 09/23/14 0921  BP:  117/76 124/73   Pulse:  94 81   Temp:  99.3 F (37.4 C) 98.5 F (36.9 C)     TempSrc:  Oral Oral   Resp:  20 18   Height:      Weight:      SpO2: 94% 97% 97% 98%    Gen:  NAD Cardiovascular:  RRR, No M/R/G Respiratory: Lungs CTAB Gastrointestinal: Abdomen soft, NT/ND with normal active bowel sounds. Extremities: No C/E/C   The results of significant diagnostics from this hospitalization (including imaging, microbiology, ancillary and laboratory) are listed below for reference.     Procedures and Diagnostic Studies:   Dg Chest 2 View  09/21/2014   CLINICAL DATA:  Cough and fever for 1 week  EXAM: CHEST  2 VIEW  COMPARISON:  January 06, 2013 chest radiograph and chest CT  FINDINGS: There is no edema or consolidation. The heart size and pulmonary vascularity are normal. No adenopathy. There is degenerative change in the thoracic spine.  IMPRESSION: No edema or consolidation.   Electronically Signed   By: Lowella Grip III M.D.   On: 09/21/2014 16:08     Labs:   Basic Metabolic Panel:  Recent Labs Lab 09/21/14 1752 09/22/14 0435  NA 139 140  K 3.9 4.0  CL 102 104  CO2 28 25  GLUCOSE 107* 185*  BUN 14 9  CREATININE 0.68 0.62  CALCIUM 8.5* 8.7*   GFR Estimated Creatinine Clearance: 94.3 mL/min (by C-G formula based on Cr of 0.62).  CBC:  Recent Labs Lab 09/21/14 1752 09/22/14 0435  WBC 4.6 4.1  HGB 12.4  11.9*  HCT 40.3 38.9  MCV 85.9 87.2  PLT 211 224    Discharge Instructions:   Discharge Instructions    Call MD for:  extreme fatigue    Complete by:  As directed      Call MD for:  temperature >100.4    Complete by:  As directed      Diet - low sodium heart healthy    Complete by:  As directed      Increase activity slowly    Complete by:  As directed             Medication List    STOP taking these medications        hydrOXYzine 25 MG tablet  Commonly known as:  ATARAX/VISTARIL      TAKE these medications        aspirin-acetaminophen-caffeine [phone] MG per tablet  Commonly known as:  EXCEDRIN MIGRAINE   Take 1 tablet by mouth every 6 (six) hours as needed for migraine (migraine).     baclofen 10 MG tablet  Commonly known as:  LIORESAL  Take 10 mg by mouth 2 (two) times daily.     butalbital-aspirin-caffeine 50-325-40 MG per capsule  Commonly known as:  FIORINAL  Take 1 capsule by mouth 3 (three) times daily as needed for pain (migraine). For headache     cholecalciferol 1000 UNITS tablet  Commonly known as:  VITAMIN D  Take 2,000 Units by mouth 2 (two) times daily.     cyclobenzaprine 10 MG tablet  Commonly known as:  FLEXERIL  Take 10 mg by mouth 3 (three) times daily as needed for muscle spasms (muscle spasms).     dextromethorphan-guaiFENesin 30-600 MG per 12 hr tablet  Commonly known as:  MUCINEX DM  Take 1 tablet by mouth 2 (two) times daily as needed for cough (cough).     diphenhydrAMINE 25 MG tablet  Commonly known as:  BENADRYL  Take 25 mg by mouth every 6 (six) hours as needed for allergies (allergies).     fish oil-omega-3 fatty acids 1000 MG capsule  Take 1 capsule by mouth daily with breakfast.     ibuprofen 800 MG tablet  Commonly known as:  ADVIL,MOTRIN  Take 800 mg by mouth every 8 (eight) hours as needed for moderate pain (pain). For pain     lisinopril 10 MG tablet  Commonly known as:  PRINIVIL,ZESTRIL  Take 10 mg by mouth daily.     MULTIVITAMIN & MINERAL PO  Take 1 tablet by mouth daily.     omeprazole 40 MG capsule  Commonly known as:  PRILOSEC  Take 40 mg by mouth daily.     oseltamivir 75 MG capsule  Commonly known as:  TAMIFLU  Take 1 capsule (75 mg total) by mouth 2 (two) times daily.     predniSONE 1 MG tablet  Commonly known as:  DELTASONE  Take 1 mg by mouth at bedtime.     predniSONE 10 MG tablet  Commonly known as:  DELTASONE  Beginning 05/02/2014, take 3 tabs po QD day 1, 2 tabs po QD day 2, 1 tab po QD day 3 then stop     pregabalin 75 MG capsule  Commonly known as:  LYRICA  Take 1 capsule (75 mg total) by mouth 2 (two) times  daily.     senna 8.6 MG tablet  Commonly known as:  SENOKOT  Take 1 tablet by mouth every other day.     tamoxifen  20 MG tablet  Commonly known as:  NOLVADEX  TAKE 1 TABLET (20 MG TOTAL) BY MOUTH DAILY.     UNABLE TO FIND  - Rx: L8000-Post Surgical Bra (Quantity: 6)  - D9741- Silicone Breast Prosthesis (Quantity: 2)  - Dx: 174.9; Bilateral partial mastectomy  - Replacement due to hot flashes since previous items were too heavy and hot           Follow-up Information    Schedule an appointment as soon as possible for a visit with Maximino Greenland, MD.   Specialty:  Internal Medicine   Why:  If symptoms worsen   Contact information:   [address] [place] [address] [phone]        Time coordinating discharge: 25 minutes.  Signed:  Sue Mcalexander  Pager [phone] [facility] 09/23/2014, 9:43 AM

## 2014-11-18 ENCOUNTER — Telehealth: Payer: Self-pay

## 2014-11-18 NOTE — Telephone Encounter (Addendum)
Pt called stating she had an unusual pain in her left side. She is very uncomfortable. She states it feels like a muscle spasm. She has had it for about 1 week.

## 2014-11-18 NOTE — Telephone Encounter (Signed)
Returned pt call: she stated the pain is in her left breast, it is associated with motion, it can wake her up out of sleep. It can radiate to her right breast at times. Pt takes flexaril for fibromyalgia and it is not helping. No redness, no swelling, no discharge, no open sores. No fever. Pt talked with Ms. Laurance Flatten, [place] at [facility] and she said to call oncologist.

## 2014-11-19 NOTE — Telephone Encounter (Signed)
Have her make an appointment with either [place] or her surgeon.

## 2014-11-22 NOTE — Telephone Encounter (Signed)
S/w pt that Dr Lindi Adie will see her. She prefers Dr Lindi Adie over Psychologist, sport and exercise. POF sent for ASAP appt.

## 2014-11-30 ENCOUNTER — Telehealth: Payer: Self-pay | Admitting: Hematology and Oncology

## 2014-11-30 NOTE — Telephone Encounter (Signed)
Patient had called and left a message as she states she has not heard from [place] for an appointment.  No pof was found.  Looked at 8/1 phone note from triage and a pof was done/found in flowsheets but was not "sent" to scheduling.  Per Karna Christmas he can see her 8/11,i have called and left a message with this appointment

## 2014-12-02 ENCOUNTER — Encounter: Payer: Self-pay | Admitting: Hematology and Oncology

## 2014-12-02 ENCOUNTER — Ambulatory Visit (HOSPITAL_BASED_OUTPATIENT_CLINIC_OR_DEPARTMENT_OTHER): Payer: Commercial Managed Care - HMO | Admitting: Hematology and Oncology

## 2014-12-02 VITALS — BP 118/83 | HR 86 | Temp 98.4°F | Resp 18 | Ht 62.0 in | Wt 250.8 lb

## 2014-12-02 DIAGNOSIS — D0511 Intraductal carcinoma in situ of right breast: Secondary | ICD-10-CM | POA: Diagnosis not present

## 2014-12-02 DIAGNOSIS — M797 Fibromyalgia: Secondary | ICD-10-CM | POA: Diagnosis not present

## 2014-12-02 DIAGNOSIS — C50511 Malignant neoplasm of lower-outer quadrant of right female breast: Secondary | ICD-10-CM | POA: Insufficient documentation

## 2014-12-02 DIAGNOSIS — C50212 Malignant neoplasm of upper-inner quadrant of left female breast: Secondary | ICD-10-CM

## 2014-12-02 DIAGNOSIS — Z853 Personal history of malignant neoplasm of breast: Secondary | ICD-10-CM | POA: Diagnosis not present

## 2014-12-02 DIAGNOSIS — C50911 Malignant neoplasm of unspecified site of right female breast: Secondary | ICD-10-CM

## 2014-12-02 NOTE — Assessment & Plan Note (Signed)
Left breast invasive ductal carcinoma grade 1 ER 77% PR 96% gases on 5% HER-2 negative, T1 AN0M0 stage I A. status post lumpectomy 2.7 2009 followed by radiation therapy completed 10/10/2007, tamoxifen started September 2009 discontinued March 2012 followed by recurrence in the right breast as DCIS 05/26/2010 completed another lumpectomy and radiation therapy and she has been on tamoxifen ever since.  Tamoxifen counseling: Patient is tolerating it very well without any major problems. she will complete 5 yrs of tamoxifen therapy in July 2017. But we may decide to add 5 more years to that. We can decide in 2017.  Breast Cancer Surveillance: 1. Breast exam 12/02/2014: Normal, fibromyalgia causing breast tenderness 2. Mammogram 07/02/2014 No abnormalities. Postsurgical changes. Breast Density Category C. I recommended that she get 3-D mammograms for surveillance. Discussed the differences between different breast density categories.  Return to clinic in 1 year for follow-up

## 2014-12-02 NOTE — Progress Notes (Signed)
Patient Care Team: [name], MD as PCP - General (Internal Medicine)  DIAGNOSIS: Breast cancer of upper-inner quadrant of left female breast   Staging form: Breast, AJCC 7th Edition     Clinical: T1a, N0, cM0 - Unsigned     Pathologic: Stage IA (T1a, N0, cM0) - Signed by Rulon Eisenmenger, MD on 02/11/2014   SUMMARY OF ONCOLOGIC HISTORY:   Breast cancer of upper-inner quadrant of left female breast   05/27/2007 Initial Diagnosis Left breast biopsy: DCIS with calcifications ER/PR 100% positive, MRI revealed 1.7 x 1.4 x 0.9 cm DCIS at 12:00 position left breast   06/20/2007 Surgery Left breast lumpectomy: T1 N0 M0 stage IA invasive ductal carcinoma 0.4-0.5 cm grade 1, ER 77%, PR 96%, Ki-67 less than 5%, HER-2 negative   08/21/2007 - 10/10/2007 Radiation Therapy Adjuvant radiation therapy   01/14/2008 -  Anti-estrogen oral therapy Tamoxifen 20 mg daily    Breast cancer, right breast   05/26/2010 Initial Diagnosis DCIS ER/PR positive   06/16/2010 Surgery No residual DCIS, extensive fibrocystic changes with focal atypical ductal hyperplasia   08/02/2010 - 09/22/2010 Radiation Therapy Adjuvant radiation   10/26/2010 -  Anti-estrogen oral therapy Tamoxifen 20 mg daily    CHIEF COMPLIANT: Breast pain  INTERVAL HISTORY: Ashley Lindsey is a 54 year old with above-mentioned history of initially left breast cancer who underwent lumpectomy radiation and tamoxifen, relapsed with right breast DCIS and is currently on tamoxifen 20 mg daily. She appears to be tolerating it fairly well. She is here today primarily because of tenderness in the breast. Previous breast exams have also shown that she has breast tenderness related to fibromyalgia. Previous mammograms done March 2016 were normal.  REVIEW OF SYSTEMS:   Constitutional: Denies fevers, chills or abnormal weight loss Eyes: Denies blurriness of vision Ears, nose, mouth, throat, and face: Denies mucositis or sore throat Respiratory: Denies cough, dyspnea or  wheezes Cardiovascular: Denies palpitation, chest discomfort or lower extremity swelling Gastrointestinal:  Denies nausea, heartburn or change in bowel habits Skin: Denies abnormal skin rashes Lymphatics: Denies new lymphadenopathy or easy bruising Neurological:Denies numbness, tingling or new weaknesses Behavioral/Psych: Mood is stable, no new changes  Breast: Tenderness in the breasts bilaterally All other systems were reviewed with the patient and are negative.  I have reviewed the past medical history, past surgical history, social history and family history with the patient and they are unchanged from previous note.  ALLERGIES:  is allergic to aspirin and penicillins.  MEDICATIONS:  Current Outpatient Prescriptions  Medication Sig Dispense Refill  . aspirin-acetaminophen-caffeine (EXCEDRIN MIGRAINE) 250-250-65 MG per tablet Take 1 tablet by mouth every 6 (six) hours as needed for migraine (migraine).    . baclofen (LIORESAL) 10 MG tablet Take 10 mg by mouth 2 (two) times daily.   3  . butalbital-aspirin-caffeine (FIORINAL) 50-325-40 MG per capsule Take 1 capsule by mouth 3 (three) times daily as needed for pain (migraine). For headache    . cholecalciferol (VITAMIN D) 1000 UNITS tablet Take 2,000 Units by mouth 2 (two) times daily.    . cyclobenzaprine (FLEXERIL) 10 MG tablet Take 10 mg by mouth 3 (three) times daily as needed for muscle spasms (muscle spasms).     Marland Kitchen dextromethorphan-guaiFENesin (MUCINEX DM) 30-600 MG per 12 hr tablet Take 1 tablet by mouth 2 (two) times daily as needed for cough (cough).    . diphenhydrAMINE (BENADRYL) 25 MG tablet Take 25 mg by mouth every 6 (six) hours as needed for allergies (allergies).    Marland Kitchen  fish oil-omega-3 fatty acids 1000 MG capsule Take 1 capsule by mouth daily with breakfast.     . ibuprofen (ADVIL,MOTRIN) 800 MG tablet Take 800 mg by mouth every 8 (eight) hours as needed for moderate pain (pain). For pain    . lisinopril (PRINIVIL,ZESTRIL)  10 MG tablet Take 10 mg by mouth daily.    . Multiple Vitamins-Minerals (MULTIVITAMIN & MINERAL PO) Take 1 tablet by mouth daily.    Marland Kitchen omeprazole (PRILOSEC) 40 MG capsule Take 40 mg by mouth daily.     Marland Kitchen oseltamivir (TAMIFLU) 75 MG capsule Take 1 capsule (75 mg total) by mouth 2 (two) times daily. 7 capsule 0  . predniSONE (DELTASONE) 1 MG tablet Take 1 mg by mouth at bedtime.    . predniSONE (DELTASONE) 10 MG tablet Beginning 05/02/2014, take 3 tabs po QD day 1, 2 tabs po QD day 2, 1 tab po QD day 3 then stop (Patient not taking: Reported on 09/21/2014) 6 tablet 0  . pregabalin (LYRICA) 75 MG capsule Take 1 capsule (75 mg total) by mouth 2 (two) times daily. 60 capsule 1  . senna (SENOKOT) 8.6 MG tablet Take 1 tablet by mouth every other day.     . tamoxifen (NOLVADEX) 20 MG tablet TAKE 1 TABLET (20 MG TOTAL) BY MOUTH DAILY. 90 tablet 3  . UNABLE TO FIND Rx: L8000-Post Surgical Bra (Quantity: 6) Z6109- Silicone Breast Prosthesis (Quantity: 2) Dx: 174.9; Bilateral partial mastectomy Replacement due to hot flashes since previous items were too heavy and hot 1 each 0   No current facility-administered medications for this visit.    PHYSICAL EXAMINATION: ECOG PERFORMANCE STATUS: 1 - Symptomatic but completely ambulatory  There were no vitals filed for this visit. There were no vitals filed for this visit.  GENERAL:alert, no distress and comfortable SKIN: skin color, texture, turgor are normal, no rashes or significant lesions EYES: normal, Conjunctiva are pink and non-injected, sclera clear OROPHARYNX:no exudate, no erythema and lips, buccal mucosa, and tongue normal  NECK: supple, thyroid normal size, non-tender, without nodularity LYMPH:  no palpable lymphadenopathy in the cervical, axillary or inguinal LUNGS: clear to auscultation and percussion with normal breathing effort HEART: regular rate & rhythm and no murmurs and no lower extremity edema ABDOMEN:abdomen soft, non-tender and  normal bowel sounds Musculoskeletal:no cyanosis of digits and no clubbing  NEURO: alert & oriented x 3 with fluent speech, no focal motor/sensory deficits BREAST: No palpable masses or nodules in either right or left breasts. No palpable axillary supraclavicular or infraclavicular adenopathy. Diffuse bilateral breast tenderness (exam performed in the presence of a chaperone)  LABORATORY DATA:  I have reviewed the data as listed   Chemistry      Component Value Date/Time   NA 140 09/22/2014 0435   NA 143 07/21/2014 1517   K 4.0 09/22/2014 0435   K 3.6 07/21/2014 1517   CL 104 09/22/2014 0435   CL 105 12/26/2011 1538   CO2 25 09/22/2014 0435   CO2 27 07/21/2014 1517   BUN 9 09/22/2014 0435   BUN 8.2 07/21/2014 1517   CREATININE 0.62 09/22/2014 0435   CREATININE 0.7 07/21/2014 1517      Component Value Date/Time   CALCIUM 8.7* 09/22/2014 0435   CALCIUM 8.6 07/21/2014 1517   ALKPHOS 72 07/21/2014 1517   ALKPHOS 94 01/06/2013 1835   AST 21 07/21/2014 1517   AST 16 01/06/2013 1835   ALT 23 07/21/2014 1517   ALT 20 01/06/2013 1835   BILITOT  0.20 07/21/2014 1517   BILITOT 0.1* 01/06/2013 1835       Lab Results  Component Value Date   WBC 4.1 09/22/2014   HGB 11.9* 09/22/2014   HCT 38.9 09/22/2014   MCV 87.2 09/22/2014   PLT 224 09/22/2014   NEUTROABS 5.0 07/21/2014     RADIOGRAPHIC STUDIES: I have personally reviewed the radiology reports and agreed with their findings. Mammogram 07/02/2014: No abnormalities  ASSESSMENT & PLAN:  Breast cancer of upper-inner quadrant of left female breast Left breast invasive ductal carcinoma grade 1 ER 77% PR 96% gases on 5% HER-2 negative, T1 AN0M0 stage I A. status post lumpectomy 2.7 2009 followed by radiation therapy completed 10/10/2007, tamoxifen started September 2009 discontinued March 2012 followed by recurrence in the right breast as DCIS 05/26/2010 completed another lumpectomy and radiation therapy and she has been on  tamoxifen ever since.  Tamoxifen counseling: Patient is tolerating it very well without any major problems. she will complete 5 yrs of tamoxifen therapy in July 2017. But we may decide to add 5 more years to that. I discussed with her that we could send her tumor for breast cancer index to determine if she would benefit from extended adjuvant therapy.  Breast Cancer Surveillance: 1. Breast exam 12/02/2014: Normal, fibromyalgia causing breast tenderness. There are no nodules or lumps to warrant any additional investigation at this time. 2. Mammogram 07/02/2014 No abnormalities. Postsurgical changes. Breast Density Category C. I recommended that she get 3-D mammograms for surveillance. Discussed the differences between different breast density categories.  Return to clinic in March 2017 for follow-up       No orders of the defined types were placed in this encounter.   The patient has a good understanding of the overall plan. she agrees with it. she will call with any problems that may develop before the next visit here.   Rulon Eisenmenger, MD

## 2014-12-13 ENCOUNTER — Encounter: Payer: Self-pay | Admitting: *Deleted

## 2014-12-13 NOTE — Progress Notes (Signed)
Ordered BCI per Dr. Lindi Adie.  Faxed requisition to [facility] and informed our pathology department.

## 2014-12-17 ENCOUNTER — Encounter (HOSPITAL_COMMUNITY): Payer: Self-pay

## 2014-12-20 ENCOUNTER — Encounter: Payer: Self-pay | Admitting: *Deleted

## 2014-12-20 NOTE — Progress Notes (Signed)
There was insufficient tissue to send a BCI on this patient. Dr. Lindi Adie notified.

## 2014-12-21 ENCOUNTER — Telehealth: Payer: Self-pay | Admitting: *Deleted

## 2014-12-21 NOTE — Telephone Encounter (Signed)
Left message for a return phone call to discuss BCI test.

## 2015-04-07 ENCOUNTER — Telehealth: Payer: Self-pay

## 2015-04-07 NOTE — Telephone Encounter (Signed)
Pt called in reporting pain and burning in both nipples, awakens from sleep, constant for last 3-4 days, 8/10, can only sleep on her right side, warmth, denies temps, cannot tell if there is swelling, denies discharge.  Pt has been taking flexeril and 800 mg ibuprofen to manage the pain.  Would like to be seen.   Appt made for 12/23 930 am.  Pt voiced understanding.

## 2015-04-14 NOTE — Assessment & Plan Note (Deleted)
Left breast invasive ductal carcinoma grade 1 ER 77% PR 96% gases on 5% HER-2 negative, T1 AN0M0 stage I A. status post lumpectomy 2.7 2009 followed by radiation therapy completed 10/10/2007, tamoxifen started September 2009 discontinued March 2012 followed by recurrence in the right breast as DCIS 05/26/2010 completed another lumpectomy and radiation therapy and she has been on tamoxifen ever since.  Tamoxifen counseling: Patient is tolerating it very well without any major problems. she will complete 5 yrs of tamoxifen therapy in July 2017. But we may decide to add 5 more years to that.There was insufficient tissue for BCI testing.I recommended continuing Tamoxifen for 5 more years.  Breast Cancer Surveillance: 1. Breast exam 12/02/2014: Normal, fibromyalgia causing breast tenderness. There are no nodules or lumps to warrant any additional investigation at this time. 2. Mammogram 07/02/2014 No abnormalities. Postsurgical changes. Breast Density Category C. I recommended that she get 3-D mammograms for surveillance. Discussed the differences between different breast density categories.  Pain and burning in both nipples, awakens from sleep, constant for last 3-4 days, 8/10, can only sleep on her right side, warmth, denies temps, cannot tell if there is swelling, denies discharge. Pt has been taking flexeril and 800 mg ibuprofen to manage the pain.

## 2015-04-15 ENCOUNTER — Ambulatory Visit: Payer: Commercial Managed Care - HMO | Admitting: Hematology and Oncology

## 2015-04-20 ENCOUNTER — Telehealth: Payer: Self-pay | Admitting: Hematology and Oncology

## 2015-04-20 NOTE — Telephone Encounter (Signed)
Patient called in to reschedule her missed appoitment

## 2015-04-26 DIAGNOSIS — R51 Headache: Secondary | ICD-10-CM | POA: Diagnosis not present

## 2015-04-26 DIAGNOSIS — G4700 Insomnia, unspecified: Secondary | ICD-10-CM | POA: Diagnosis not present

## 2015-04-26 DIAGNOSIS — I10 Essential (primary) hypertension: Secondary | ICD-10-CM | POA: Diagnosis not present

## 2015-04-28 ENCOUNTER — Telehealth: Payer: Self-pay | Admitting: Hematology and Oncology

## 2015-04-28 ENCOUNTER — Ambulatory Visit: Payer: Commercial Managed Care - HMO | Admitting: Hematology and Oncology

## 2015-04-28 NOTE — Telephone Encounter (Signed)
Patient called reporting today at 1:45 pm she does not have transporatation.  Transferred to scheduler to reschedule her rescheduled appointment.

## 2015-04-28 NOTE — Assessment & Plan Note (Signed)
Left breast invasive ductal carcinoma grade 1 ER 77% PR 96% gases on 5% HER-2 negative, T1 AN0M0 stage I A. status post lumpectomy 2.7 2009 followed by radiation therapy completed 10/10/2007, tamoxifen started September 2009 discontinued March 2012 followed by recurrence in the right breast as DCIS 05/26/2010 completed another lumpectomy and radiation therapy and she has been on tamoxifen ever since.  Tamoxifen counseling: Patient is tolerating it very well without any major problems. she will complete 5 yrs of tamoxifen therapy in July 2017. But we may decide to add 5 more years to that.There was insufficient tissue for BCI testing.I recommended continuing Tamoxifen for 5 more years.  Breast Cancer Surveillance: 1. Breast exam 12/02/2014: Normal, fibromyalgia causing breast tenderness. There are no nodules or lumps to warrant any additional investigation at this time. 2. Mammogram 07/02/2014 No abnormalities. Postsurgical changes. Breast Density Category C. I recommended that she get 3-D mammograms for surveillance. Discussed the differences between different breast density categories.  Pain and burning in both nipples, awakens from sleep, constant for last 3-4 days, 8/10, can only sleep on her right side, warmth, denies temps, cannot tell if there is swelling, denies discharge. Pt has been taking flexeril and 800 mg ibuprofen to manage the pain. 

## 2015-04-28 NOTE — Telephone Encounter (Signed)
Patient called in to move her appt to later due to car trouble

## 2015-04-29 ENCOUNTER — Ambulatory Visit: Payer: Commercial Managed Care - HMO | Admitting: Hematology and Oncology

## 2015-04-29 NOTE — Assessment & Plan Note (Signed)
Right breast DCIS 05/26/2010 completed another lumpectomy and radiation therapy and she has been on tamoxifen ever since

## 2015-04-29 NOTE — Assessment & Plan Note (Signed)
Left breast invasive ductal carcinoma grade 1 ER 77% PR 96% gases on 5% HER-2 negative, T1 AN0M0 stage I A. status post lumpectomy 2.7 2009 followed by radiation therapy completed 10/10/2007, tamoxifen started September 2009 discontinued March 2012 followed by recurrence in the right breast as DCIS 05/26/2010 completed another lumpectomy and radiation therapy and she has been on tamoxifen ever since.  Tamoxifen counseling: Patient is tolerating it very well without any major problems. she will complete 5 yrs of tamoxifen therapy in July 2017 There was insufficient tissue to run breast cancer index test Based on her excellent tolerability, I recommended continuation of tamoxifen for 10 year duration  Breast Cancer Surveillance: 1. Breast exam 12/02/2014: Normal, fibromyalgia causing breast tenderness. There are no nodules or lumps to warrant any additional investigation at this time. 2. Mammogram 07/02/2014 No abnormalities. Postsurgical changes. Breast Density Category C. I recommended that she get 3-D mammograms for surveillance. Discussed the differences between different breast density categories.

## 2015-05-03 ENCOUNTER — Emergency Department (HOSPITAL_COMMUNITY)
Admission: EM | Admit: 2015-05-03 | Discharge: 2015-05-03 | Discharge status: Against Medical Advice | Payer: PPO | Attending: Emergency Medicine | Admitting: Emergency Medicine

## 2015-05-03 ENCOUNTER — Encounter (HOSPITAL_COMMUNITY): Payer: Self-pay | Admitting: Emergency Medicine

## 2015-05-03 DIAGNOSIS — M25569 Pain in unspecified knee: Secondary | ICD-10-CM | POA: Insufficient documentation

## 2015-05-03 DIAGNOSIS — Z859 Personal history of malignant neoplasm, unspecified: Secondary | ICD-10-CM | POA: Diagnosis not present

## 2015-05-03 DIAGNOSIS — F17210 Nicotine dependence, cigarettes, uncomplicated: Secondary | ICD-10-CM | POA: Insufficient documentation

## 2015-05-03 DIAGNOSIS — R200 Anesthesia of skin: Secondary | ICD-10-CM | POA: Insufficient documentation

## 2015-05-03 NOTE — ED Notes (Signed)
Pt states she needs to leave and wants to follow up with her orthopedic MD. Risks of leaving AMA explained to patient and she stated she understands this. Pt encouraged to return to ED for worsening symptoms. Pt A&OX4, ambulatory with steady gait, NAD

## 2015-05-03 NOTE — ED Notes (Signed)
Pt sts left knee pain; pt sts HA yesterday and some numbness in left side with tingling

## 2015-05-06 ENCOUNTER — Telehealth: Payer: Self-pay | Admitting: Hematology and Oncology

## 2015-05-06 NOTE — Telephone Encounter (Signed)
Returned patients call to reschedule her missed appt

## 2015-05-15 NOTE — Assessment & Plan Note (Signed)
Left breast invasive ductal carcinoma grade 1 ER 77% PR 96% gases on 5% HER-2 negative, T1 AN0M0 stage I A. status post lumpectomy 2.7 2009 followed by radiation therapy completed 10/10/2007, tamoxifen started September 2009 discontinued March 2012 followed by recurrence in the right breast as DCIS 05/26/2010 completed another lumpectomy and radiation therapy and she has been on tamoxifen ever since.  Tamoxifen counseling: Patient is tolerating it very well without any major problems. she will complete 5 yrs of tamoxifen therapy in July 2017. But we may decide to add 5 more years to that. I discussed with her that we could send her tumor for breast cancer index to determine if she would benefit from extended adjuvant therapy.  Breast Cancer Surveillance: 1. Breast exam 12/02/2014: Normal, fibromyalgia causing breast tenderness. There are no nodules or lumps to warrant any additional investigation at this time. 2. Mammogram 07/02/2014 No abnormalities. Postsurgical changes. Breast Density Category C. I recommended that she get 3-D mammograms for surveillance. Discussed the differences between different breast density categories.

## 2015-05-16 ENCOUNTER — Encounter: Payer: Self-pay | Admitting: Hematology and Oncology

## 2015-05-16 ENCOUNTER — Telehealth: Payer: Self-pay | Admitting: Hematology and Oncology

## 2015-05-16 ENCOUNTER — Ambulatory Visit (HOSPITAL_BASED_OUTPATIENT_CLINIC_OR_DEPARTMENT_OTHER): Payer: Self-pay | Admitting: Hematology and Oncology

## 2015-05-16 VITALS — BP 137/81 | HR 89 | Temp 98.0°F | Resp 18 | Ht 62.0 in | Wt 254.7 lb

## 2015-05-16 DIAGNOSIS — Z23 Encounter for immunization: Secondary | ICD-10-CM

## 2015-05-16 DIAGNOSIS — C50511 Malignant neoplasm of lower-outer quadrant of right female breast: Secondary | ICD-10-CM

## 2015-05-16 DIAGNOSIS — F329 Major depressive disorder, single episode, unspecified: Secondary | ICD-10-CM

## 2015-05-16 DIAGNOSIS — M791 Myalgia: Secondary | ICD-10-CM

## 2015-05-16 DIAGNOSIS — D0511 Intraductal carcinoma in situ of right breast: Secondary | ICD-10-CM

## 2015-05-16 DIAGNOSIS — Z853 Personal history of malignant neoplasm of breast: Secondary | ICD-10-CM

## 2015-05-16 DIAGNOSIS — N951 Menopausal and female climacteric states: Secondary | ICD-10-CM

## 2015-05-16 MED ORDER — VENLAFAXINE HCL ER 37.5 MG PO CP24: 37.5000 mg | ORAL_CAPSULE | Freq: Every day | ORAL | Status: DC

## 2015-05-16 MED ORDER — INFLUENZA VAC SPLIT QUAD 0.5 ML IM SUSY
0.5000 mL | PREFILLED_SYRINGE | Freq: Once | INTRAMUSCULAR | Status: AC
  Administered 2015-05-16: 0.5 mL via INTRAMUSCULAR
  Filled 2015-05-16: qty 0.5

## 2015-05-16 MED ORDER — PROPRANOLOL HCL ER 80 MG PO CP24: 80.0000 mg | ORAL_CAPSULE | Freq: Every day | ORAL | Status: DC

## 2015-05-16 NOTE — Progress Notes (Signed)
Patient Care Team: [name], MD as PCP - General (Internal Medicine)  DIAGNOSIS: Breast cancer of upper-inner quadrant of left female breast [facility])   Staging form: Breast, AJCC 7th Edition     Clinical: T1a, N0, cM0 - Unsigned     Pathologic: Stage IA (T1a, N0, cM0) - Signed by Rulon Eisenmenger, MD on 02/11/2014   SUMMARY OF ONCOLOGIC HISTORY:   Breast cancer of upper-inner quadrant of left female breast ([place])   05/27/2007 Initial Diagnosis Left breast biopsy: DCIS with calcifications ER/PR 100% positive, MRI revealed 1.7 x 1.4 x 0.9 cm DCIS at 12:00 position left breast   06/20/2007 Surgery Left breast lumpectomy: T1 N0 M0 stage IA invasive ductal carcinoma 0.4-0.5 cm grade 1, ER 77%, PR 96%, Ki-67 less than 5%, HER-2 negative   08/21/2007 - 10/10/2007 Radiation Therapy Adjuvant radiation therapy   01/14/2008 -  Anti-estrogen oral therapy Tamoxifen 20 mg daily    Breast cancer of lower-outer quadrant of right female breast ([place])   05/26/2010 Initial Diagnosis DCIS ER/PR positive   06/16/2010 Surgery No residual DCIS, extensive fibrocystic changes with focal atypical ductal hyperplasia   08/02/2010 - 09/22/2010 Radiation Therapy Adjuvant radiation   10/26/2010 -  Anti-estrogen oral therapy Tamoxifen 20 mg daily    CHIEF COMPLIANT: Follow-up on tamoxifen therapy  INTERVAL HISTORY: Ashley Lindsey is a 55 year old with above-mentioned history of left breast cancer and her most recent diagnosis of DCIS in 2012. She has been on oral antiestrogen therapy with tamoxifen for the past 5 years. She has been tolerating it fairly well except for hot flashes, depression, mood swings, myalgias. She is thinking of getting a knee replacement surgery. She denies any lumps or nodules in the breasts.  REVIEW OF SYSTEMS:   Constitutional: Denies fevers, chills or abnormal weight loss Eyes: Denies blurriness of vision Ears, nose, mouth, throat, and face: Denies mucositis or sore throat Respiratory: Denies cough,  dyspnea or wheezes Cardiovascular: Denies palpitation, chest discomfort Gastrointestinal:  Denies nausea, heartburn or change in bowel habits Skin: Denies abnormal skin rashes Lymphatics: Denies new lymphadenopathy or easy bruising Neurological:Denies numbness, tingling or new weaknesses Behavioral/Psych: Mood is stable, no new changes  Extremities: No lower extremity edema Breast:  denies any pain or lumps or nodules in either breasts All other systems were reviewed with the patient and are negative.  I have reviewed the past medical history, past surgical history, social history and family history with the patient and they are unchanged from previous note.  ALLERGIES:  is allergic to aspirin and penicillins.  MEDICATIONS:  Current Outpatient Prescriptions  Medication Sig Dispense Refill  . aspirin-acetaminophen-caffeine (EXCEDRIN MIGRAINE) 250-250-65 MG per tablet Take 1 tablet by mouth every 6 (six) hours as needed for migraine (migraine).    . baclofen (LIORESAL) 10 MG tablet Take 10 mg by mouth 2 (two) times daily.   3  . butalbital-aspirin-caffeine (FIORINAL) 50-325-40 MG per capsule Take 1 capsule by mouth 3 (three) times daily as needed for pain (migraine). For headache    . cholecalciferol (VITAMIN D) 1000 UNITS tablet Take 2,000 Units by mouth 2 (two) times daily.    . cyclobenzaprine (FLEXERIL) 10 MG tablet Take 10 mg by mouth 3 (three) times daily as needed for muscle spasms (muscle spasms).     Marland Kitchen dextromethorphan-guaiFENesin (MUCINEX DM) 30-600 MG per 12 hr tablet Take 1 tablet by mouth 2 (two) times daily as needed for cough (cough).    . diphenhydrAMINE (BENADRYL) 25 MG tablet Take 25  mg by mouth every 6 (six) hours as needed for allergies (allergies).    . fish oil-omega-3 fatty acids 1000 MG capsule Take 1 capsule by mouth daily with breakfast.     . ibuprofen (ADVIL,MOTRIN) 800 MG tablet Take 800 mg by mouth every 8 (eight) hours as needed for moderate pain (pain). For  pain    . lisinopril (PRINIVIL,ZESTRIL) 10 MG tablet Take 10 mg by mouth daily.    . Multiple Vitamins-Minerals (MULTIVITAMIN & MINERAL PO) Take 1 tablet by mouth daily.    Marland Kitchen omeprazole (PRILOSEC) 40 MG capsule Take 40 mg by mouth daily.     Marland Kitchen oseltamivir (TAMIFLU) 75 MG capsule Take 1 capsule (75 mg total) by mouth 2 (two) times daily. 7 capsule 0  . predniSONE (DELTASONE) 1 MG tablet Take 1 mg by mouth at bedtime.    . predniSONE (DELTASONE) 10 MG tablet Beginning 05/02/2014, take 3 tabs po QD day 1, 2 tabs po QD day 2, 1 tab po QD day 3 then stop 6 tablet 0  . pregabalin (LYRICA) 75 MG capsule Take 1 capsule (75 mg total) by mouth 2 (two) times daily. 60 capsule 1  . senna (SENOKOT) 8.6 MG tablet Take 1 tablet by mouth every other day.     . tamoxifen (NOLVADEX) 20 MG tablet TAKE 1 TABLET (20 MG TOTAL) BY MOUTH DAILY. 90 tablet 3  . UNABLE TO FIND Rx: L8000-Post Surgical Bra (Quantity: 6) D9741- Silicone Breast Prosthesis (Quantity: 2) Dx: 174.9; Bilateral partial mastectomy Replacement due to hot flashes since previous items were too heavy and hot 1 each 0   No current facility-administered medications for this visit.    PHYSICAL EXAMINATION: ECOG PERFORMANCE STATUS: 1 - Symptomatic but completely ambulatory  Filed Vitals:   05/16/15 0922  BP: 137/81  Pulse: 89  Temp: 98 F (36.7 C)  Resp: 18   Filed Weights   05/16/15 0922  Weight: 254 lb 11.2 oz (115.531 kg)    GENERAL:alert, no distress and comfortable SKIN: skin color, texture, turgor are normal, no rashes or significant lesions EYES: normal, Conjunctiva are pink and non-injected, sclera clear OROPHARYNX:no exudate, no erythema and lips, buccal mucosa, and tongue normal  NECK: supple, thyroid normal size, non-tender, without nodularity LYMPH:  no palpable lymphadenopathy in the cervical, axillary or inguinal LUNGS: clear to auscultation and percussion with normal breathing effort HEART: regular rate & rhythm and no  murmurs and no lower extremity edema ABDOMEN:abdomen soft, non-tender and normal bowel sounds MUSCULOSKELETAL:no cyanosis of digits and no clubbing  NEURO: alert & oriented x 3 with fluent speech, no focal motor/sensory deficits EXTREMITIES: No lower extremity edema BREAST: No palpable masses or nodules in either right or left breasts. No palpable axillary supraclavicular or infraclavicular adenopathy no breast tenderness or nipple discharge. (exam performed in the presence of a chaperone)  LABORATORY DATA:  I have reviewed the data as listed   Chemistry      Component Value Date/Time   NA 140 09/22/2014 0435   NA 143 07/21/2014 1517   K 4.0 09/22/2014 0435   K 3.6 07/21/2014 1517   CL 104 09/22/2014 0435   CL 105 12/26/2011 1538   CO2 25 09/22/2014 0435   CO2 27 07/21/2014 1517   BUN 9 09/22/2014 0435   BUN 8.2 07/21/2014 1517   CREATININE 0.62 09/22/2014 0435   CREATININE 0.7 07/21/2014 1517      Component Value Date/Time   CALCIUM 8.7* 09/22/2014 0435   CALCIUM 8.6 07/21/2014  1517   ALKPHOS 72 07/21/2014 1517   ALKPHOS 94 01/06/2013 1835   AST 21 07/21/2014 1517   AST 16 01/06/2013 1835   ALT 23 07/21/2014 1517   ALT 20 01/06/2013 1835   BILITOT 0.20 07/21/2014 1517   BILITOT 0.1* 01/06/2013 1835       Lab Results  Component Value Date   WBC 4.1 09/22/2014   HGB 11.9* 09/22/2014   HCT 38.9 09/22/2014   MCV 87.2 09/22/2014   PLT 224 09/22/2014   NEUTROABS 5.0 07/21/2014     ASSESSMENT & PLAN:  Breast cancer of lower-outer quadrant of right female breast ([place]) Left breast invasive ductal carcinoma grade 1 ER 77% PR 96% gases on 5% HER-2 negative, T1 AN0M0 stage I A. status post lumpectomy 2.7 2009 followed by radiation therapy completed 10/10/2007, tamoxifen started September 2009 discontinued March 2012 followed by recurrence in the right breast as DCIS 05/26/2010 completed another lumpectomy and radiation therapy and she has been on tamoxifen ever  since.  Tamoxifen counseling: Patient is tolerating it very well without any major problems. she will complete 5 yrs of tamoxifen therapy in July 2017. But we may decide to add 5 more years to that. I discussed with her that we could send her tumor for breast cancer index to determine if she would benefit from extended adjuvant therapy.  Breast Cancer Surveillance: 1. Breast exam 12/02/2014: Normal, fibromyalgia causing breast tenderness. There are no nodules or lumps to warrant any additional investigation at this time. 2. Mammogram 07/02/2014 No abnormalities. Postsurgical changes. Breast Density Category C. I recommended that she get 3-D mammograms for surveillance. Discussed the differences between different breast density categories.   No orders of the defined types were placed in this encounter.   The patient has a good understanding of the overall plan. she agrees with it. she will call with any problems that may develop before the next visit here.   Rulon Eisenmenger, MD 05/16/2015

## 2015-05-16 NOTE — Telephone Encounter (Signed)
Gave patient avs report and appointments for March/July. Patient aware she will be informed re status of March appointments - per 1/23 pof f/u 6 mos - no lab. Patient has existing lab/fu for March and does not want to cx the lab - desk nurse informed and will let me know what to do about March lab/fu.

## 2015-05-23 DIAGNOSIS — K5730 Diverticulosis of large intestine without perforation or abscess without bleeding: Secondary | ICD-10-CM | POA: Diagnosis not present

## 2015-05-23 DIAGNOSIS — Z1211 Encounter for screening for malignant neoplasm of colon: Secondary | ICD-10-CM | POA: Diagnosis not present

## 2015-06-01 ENCOUNTER — Ambulatory Visit (HOSPITAL_BASED_OUTPATIENT_CLINIC_OR_DEPARTMENT_OTHER): Payer: PPO | Attending: Internal Medicine | Admitting: Radiology

## 2015-06-01 VITALS — Ht 63.0 in | Wt 240.0 lb

## 2015-06-01 DIAGNOSIS — R0683 Snoring: Secondary | ICD-10-CM | POA: Insufficient documentation

## 2015-06-01 DIAGNOSIS — G4733 Obstructive sleep apnea (adult) (pediatric): Secondary | ICD-10-CM | POA: Diagnosis not present

## 2015-06-01 DIAGNOSIS — E669 Obesity, unspecified: Secondary | ICD-10-CM | POA: Diagnosis not present

## 2015-06-01 DIAGNOSIS — G4719 Other hypersomnia: Secondary | ICD-10-CM | POA: Insufficient documentation

## 2015-06-01 DIAGNOSIS — Z79899 Other long term (current) drug therapy: Secondary | ICD-10-CM | POA: Diagnosis not present

## 2015-06-01 DIAGNOSIS — Z6841 Body Mass Index (BMI) 40.0 and over, adult: Secondary | ICD-10-CM | POA: Diagnosis not present

## 2015-06-01 DIAGNOSIS — R5383 Other fatigue: Secondary | ICD-10-CM | POA: Diagnosis not present

## 2015-06-04 DIAGNOSIS — G4733 Obstructive sleep apnea (adult) (pediatric): Secondary | ICD-10-CM | POA: Diagnosis not present

## 2015-06-04 NOTE — Progress Notes (Signed)
  Patient Name: Ashley Lindsey, Ashley Lindsey Date: 06/01/2015 Gender: Female D.O.B: [birth year] Age (years): 54 Referring Provider: [name] Height (inches): 37 Interpreting Physician: Baird Lyons MD, ABSM Weight (lbs): 240 RPSGT: Laren Everts BMI: 43 MRN: [id number] Neck Size: 15.50 CLINICAL INFORMATION Sleep Study Type: NPSG Indication for sleep study: Excessive Daytime Sleepiness, Fatigue, Obesity, OSA, Re-Evaluation, Snoring, Witnessed Apneas Epworth Sleepiness Score: 15  Most recent polysomnogram dated 11/22/2011 revealed an AHI of 16.1/h.  SLEEP STUDY TECHNIQUE As per the AASM Manual for the Scoring of Sleep and Associated Events v2.3 (April 2016) with a hypopnea requiring 4% desaturations. The channels recorded and monitored were frontal, central and occipital EEG, electrooculogram (EOG), submentalis EMG (chin), nasal and oral airflow, thoracic and abdominal wall motion, anterior tibialis EMG, snore microphone, electrocardiogram, and pulse oximetry.  MEDICATIONS Patient's medications include:  charted for review Medications self-administered by patient during sleep study :  - Tylenol PM at 10:04:24 PM, diphenhydramine, ibuprofen, Excedrin Migraine, Prilosec  SLEEP ARCHITECTURE The study was initiated at 10:46:17 PM and ended at 5:23:55 AM. Sleep onset time was 34.2 minutes and the sleep efficiency was 80.5%. The total sleep time was 319.9 minutes. Stage REM latency was 210.0 minutes. The patient spent 23.73% of the night in stage N1 sleep, 63.92% in stage N2 sleep, 0.00% in stage N3 and 12.35% in REM. Alpha intrusion was absent. Supine sleep was 39.99%. Wake after sleep onset 43 minutes  RESPIRATORY PARAMETERS The overall apnea/hypopnea index (AHI) was 8.8 per hour. There were 4 total apneas, including 4 obstructive, 0 central and 0 mixed apneas. There were 43 hypopneas and 28 RERAs. The AHI during Stage REM sleep was 19.7 per hour. AHI while supine was 11.7 per  hour. The mean oxygen saturation was 94.77%. The minimum SpO2 during sleep was 82 %. Moderate snoring was noted during this study.  CARDIAC DATA The 2 lead EKG demonstrated sinus rhythm. The mean heart rate was 78.07 beats per minute. Other EKG findings include: None.  LEG MOVEMENT DATA The total PLMS were 0 with a resulting PLMS index of 0.00. Associated arousal with leg movement index was 0.0 .  IMPRESSIONS - Mild obstructive sleep apnea occurred during this study (AHI = 8.8/h). - There were not enough early events to meet protocol requirements for split CPAP titration - No significant central sleep apnea occurred during this study (CAI = 0.0/h). - Oxygen desaturation was noted during this study (Min O2 = 82 %). - The patient snored with Moderate snoring volume. - No cardiac abnormalities were noted during this study. - Clinically significant periodic limb movements did not occur during sleep. No significant associated arousals.  DIAGNOSIS - Obstructive Sleep Apnea (327.23 [G47.33 ICD-10])  RECOMMENDATIONS - If conservative measures are insufficient, then CPAP, a fitted oral appliance, or occasionally surgical evaluation might be appropriate. - Avoid alcohol, sedatives and other CNS depressants that may worsen sleep apnea and disrupt normal sleep architecture. - Sleep hygiene should be reviewed to assess factors that may improve sleep quality. - Weight management and regular exercise should be initiated or continued if appropriate.  Deneise Lever Diplomate, American Board of Sleep Medicine  ELECTRONICALLY SIGNED ON:  06/04/2015, 12:29 PM [place] PH: [phone]   FX: [phone] [place]

## 2015-07-19 DIAGNOSIS — M25562 Pain in left knee: Secondary | ICD-10-CM | POA: Diagnosis not present

## 2015-07-19 DIAGNOSIS — M797 Fibromyalgia: Secondary | ICD-10-CM | POA: Diagnosis not present

## 2015-07-19 DIAGNOSIS — Z0000 Encounter for general adult medical examination without abnormal findings: Secondary | ICD-10-CM | POA: Diagnosis not present

## 2015-07-19 DIAGNOSIS — Z1231 Encounter for screening mammogram for malignant neoplasm of breast: Secondary | ICD-10-CM | POA: Diagnosis not present

## 2015-07-19 DIAGNOSIS — Z853 Personal history of malignant neoplasm of breast: Secondary | ICD-10-CM | POA: Diagnosis not present

## 2015-07-19 DIAGNOSIS — G4733 Obstructive sleep apnea (adult) (pediatric): Secondary | ICD-10-CM | POA: Diagnosis not present

## 2015-07-19 DIAGNOSIS — I10 Essential (primary) hypertension: Secondary | ICD-10-CM | POA: Diagnosis not present

## 2015-07-21 ENCOUNTER — Ambulatory Visit (HOSPITAL_BASED_OUTPATIENT_CLINIC_OR_DEPARTMENT_OTHER): Payer: PPO | Admitting: Hematology and Oncology

## 2015-07-21 ENCOUNTER — Other Ambulatory Visit: Payer: Self-pay | Admitting: Hematology and Oncology

## 2015-07-21 ENCOUNTER — Encounter: Payer: Self-pay | Admitting: Hematology and Oncology

## 2015-07-21 ENCOUNTER — Telehealth: Payer: Self-pay | Admitting: Hematology and Oncology

## 2015-07-21 ENCOUNTER — Other Ambulatory Visit (HOSPITAL_BASED_OUTPATIENT_CLINIC_OR_DEPARTMENT_OTHER): Payer: PPO

## 2015-07-21 VITALS — BP 113/67 | HR 87 | Temp 98.0°F | Resp 18 | Wt 257.4 lb

## 2015-07-21 DIAGNOSIS — Z1231 Encounter for screening mammogram for malignant neoplasm of breast: Secondary | ICD-10-CM

## 2015-07-21 DIAGNOSIS — C50212 Malignant neoplasm of upper-inner quadrant of left female breast: Secondary | ICD-10-CM

## 2015-07-21 LAB — CBC WITH DIFFERENTIAL/PLATELET
BASO%: 0.1 % (ref 0.0–2.0)
Basophils Absolute: 0 10*3/uL (ref 0.0–0.1)
EOS%: 1.7 % (ref 0.0–7.0)
Eosinophils Absolute: 0.1 10*3/uL (ref 0.0–0.5)
HCT: 37.6 % (ref 34.8–46.6)
HGB: 11.6 g/dL (ref 11.6–15.9)
LYMPH%: 36.2 % (ref 14.0–49.7)
MCH: 26.2 pg (ref 25.1–34.0)
MCHC: 30.9 g/dL — ABNORMAL LOW (ref 31.5–36.0)
MCV: 84.9 fL (ref 79.5–101.0)
MONO#: 0.6 10*3/uL (ref 0.1–0.9)
MONO%: 7.2 % (ref 0.0–14.0)
NEUT#: 4.2 10*3/uL (ref 1.5–6.5)
NEUT%: 54.8 % (ref 38.4–76.8)
Platelets: 319 10*3/uL (ref 145–400)
RBC: 4.43 10*6/uL (ref 3.70–5.45)
RDW: 14.2 % (ref 11.2–14.5)
WBC: 7.7 10*3/uL (ref 3.9–10.3)
lymph#: 2.8 10*3/uL (ref 0.9–3.3)

## 2015-07-21 LAB — COMPREHENSIVE METABOLIC PANEL
ALT: 13 U/L (ref 0–55)
AST: 16 U/L (ref 5–34)
Albumin: 3.2 g/dL — ABNORMAL LOW (ref 3.5–5.0)
Alkaline Phosphatase: 78 U/L (ref 40–150)
Anion Gap: 6 mEq/L (ref 3–11)
BUN: 12.9 mg/dL (ref 7.0–26.0)
CO2: 28 mEq/L (ref 22–29)
Calcium: 8.9 mg/dL (ref 8.4–10.4)
Chloride: 107 mEq/L (ref 98–109)
Creatinine: 0.8 mg/dL (ref 0.6–1.1)
EGFR: >90 mL/min/{1.73_m2} (ref >90)
Glucose: 99 mg/dl (ref 70–140)
Potassium: 4.1 mEq/L (ref 3.5–5.1)
Sodium: 141 mEq/L (ref 136–145)
Total Bilirubin: <0.3 mg/dL (ref 0.20–1.20)
Total Protein: 7 g/dL (ref 6.4–8.3)

## 2015-07-21 MED ORDER — VITAMIN D3 125 MCG (5000 UT) PO TABS: 5000.0000 [IU] | ORAL_TABLET | Freq: Two times a day (BID) | ORAL | Status: AC

## 2015-07-21 NOTE — Telephone Encounter (Signed)
appt made and avs printed °

## 2015-07-21 NOTE — Addendum Note (Signed)
Addended by: Prentiss Bells on: 07/21/2015 05:45 PM   Modules accepted: Medications

## 2015-07-21 NOTE — Progress Notes (Signed)
Meds reconciled from list provided by MD.

## 2015-07-21 NOTE — Progress Notes (Signed)
Patient Care Team: [name], MD as PCP - General (Internal Medicine)  DIAGNOSIS: Breast cancer of upper-inner quadrant of left female breast [facility])   Staging form: Breast, AJCC 7th Edition     Clinical: T1a, N0, cM0 - Unsigned     Pathologic: Stage IA (T1a, N0, cM0) - Signed by Rulon Eisenmenger, MD on 02/11/2014   SUMMARY OF ONCOLOGIC HISTORY:   Breast cancer of upper-inner quadrant of left female breast ([place])   05/27/2007 Initial Diagnosis Left breast biopsy: DCIS with calcifications ER/PR 100% positive, MRI revealed 1.7 x 1.4 x 0.9 cm DCIS at 12:00 position left breast   06/20/2007 Surgery Left breast lumpectomy: T1 N0 M0 stage IA invasive ductal carcinoma 0.4-0.5 cm grade 1, ER 77%, PR 96%, Ki-67 less than 5%, HER-2 negative   08/21/2007 - 10/10/2007 Radiation Therapy Adjuvant radiation therapy   01/14/2008 -  Anti-estrogen oral therapy Tamoxifen 20 mg daily    Breast cancer of lower-outer quadrant of right female breast ([place])   05/26/2010 Initial Diagnosis DCIS ER/PR positive   06/16/2010 Surgery No residual DCIS, extensive fibrocystic changes with focal atypical ductal hyperplasia   08/02/2010 - 09/22/2010 Radiation Therapy Adjuvant radiation   10/26/2010 -  Anti-estrogen oral therapy Tamoxifen 20 mg daily    CHIEF COMPLIANT: diffuse pain and discomfort in the left breast  INTERVAL HISTORY: Ashley Lindsey is a 55 year old with above-mentioned history of bilateral breast cancers currently on adjuvant antiestrogen therapy with tamoxifen. She had extensive musculoskeletal aches and pains. I suspect that this is related to fibromyalgia. She is also dealing with severe left knee pain and she requires a knee replacement surgery. She has a lot of pain in the left breast especially when she does not wear a bra.  REVIEW OF SYSTEMS:   Constitutional: Denies fevers, chills or abnormal weight loss Eyes: Denies blurriness of vision Ears, nose, mouth, throat, and face: Denies mucositis or sore  throat Respiratory: Denies cough, dyspnea or wheezes Cardiovascular: Denies palpitation, chest discomfort Gastrointestinal:  Denies nausea, heartburn or change in bowel habits Skin: Denies abnormal skin rashes Lymphatics: Denies new lymphadenopathy or easy bruising Neurological:Denies numbness, tingling or new weaknesses Behavioral/Psych: Mood is stable, no new changes  Extremities: No lower extremity edema, left knee arthritis Breast: severe breast pain especially on the left. All other systems were reviewed with the patient and are negative.  I have reviewed the past medical history, past surgical history, social history and family history with the patient and they are unchanged from previous note.  ALLERGIES:  is allergic to aspirin and penicillins.  MEDICATIONS:  Current Outpatient Prescriptions  Medication Sig Dispense Refill  . aspirin-acetaminophen-caffeine (EXCEDRIN MIGRAINE) 250-250-65 MG per tablet Take 1 tablet by mouth every 6 (six) hours as needed for migraine (migraine).    . butalbital-aspirin-caffeine (FIORINAL) 50-325-40 MG per capsule Take 1 capsule by mouth 3 (three) times daily as needed for pain (migraine). For headache    . cholecalciferol (VITAMIN D) 1000 UNITS tablet Take 2,000 Units by mouth 2 (two) times daily.    . cyclobenzaprine (FLEXERIL) 10 MG tablet Take 10 mg by mouth 3 (three) times daily as needed for muscle spasms (muscle spasms).     Marland Kitchen dextromethorphan-guaiFENesin (MUCINEX DM) 30-600 MG per 12 hr tablet Take 1 tablet by mouth 2 (two) times daily as needed for cough (cough).    . diphenhydrAMINE (BENADRYL) 25 MG tablet Take 25 mg by mouth every 6 (six) hours as needed for allergies (allergies).    Marland Kitchen  fish oil-omega-3 fatty acids 1000 MG capsule Take 1 capsule by mouth daily with breakfast.     . ibuprofen (ADVIL,MOTRIN) 800 MG tablet Take 800 mg by mouth every 8 (eight) hours as needed for moderate pain (pain). For pain    . lisinopril (PRINIVIL,ZESTRIL)  10 MG tablet Take 10 mg by mouth daily.    . Multiple Vitamins-Minerals (MULTIVITAMIN & MINERAL PO) Take 1 tablet by mouth daily.    . pregabalin (LYRICA) 75 MG capsule Take 1 capsule (75 mg total) by mouth 2 (two) times daily. 60 capsule 1  . propranolol ER (INDERAL LA) 80 MG 24 hr capsule Take 1 capsule (80 mg total) by mouth daily.    Marland Kitchen senna (SENOKOT) 8.6 MG tablet Take 1 tablet by mouth every other day.     . tamoxifen (NOLVADEX) 20 MG tablet TAKE 1 TABLET (20 MG TOTAL) BY MOUTH DAILY. 90 tablet 3  . UNABLE TO FIND Rx: L8000-Post Surgical Bra (Quantity: 6) [id number]- Silicone Breast Prosthesis (Quantity: 2) Dx: 174.9; Bilateral partial mastectomy Replacement due to hot flashes since previous items were too heavy and hot 1 each 0  . venlafaxine XR (EFFEXOR-XR) 37.5 MG 24 hr capsule Take 1 capsule (37.5 mg total) by mouth daily with breakfast. 30 capsule 6   No current facility-administered medications for this visit.    PHYSICAL EXAMINATION: ECOG PERFORMANCE STATUS: 1 - Symptomatic but completely ambulatory  Filed Vitals:   07/21/15 1453  BP: 113/67  Pulse: 87  Temp: 98 F (36.7 C)  Resp: 18   Filed Weights   07/21/15 1453  Weight: 257 lb 6.4 oz (116.756 kg)    GENERAL:alert, no distress and comfortable SKIN: skin color, texture, turgor are normal, no rashes or significant lesions EYES: normal, Conjunctiva are pink and non-injected, sclera clear OROPHARYNX:no exudate, no erythema and lips, buccal mucosa, and tongue normal  NECK: supple, thyroid normal size, non-tender, without nodularity LYMPH:  no palpable lymphadenopathy in the cervical, axillary or inguinal LUNGS: clear to auscultation and percussion with normal breathing effort HEART: regular rate & rhythm and no murmurs and no lower extremity edema ABDOMEN:abdomen soft, non-tender and normal bowel sounds MUSCULOSKELETAL:no cyanosis of digits and no clubbing  NEURO: alert & oriented x 3 with fluent speech, no focal  motor/sensory deficits EXTREMITIES: No lower extremity edema BREAST:tenderness in the left breast scar tissue in the upper quadrant related to prior breast surgery and radiation. Diffuse tenderness throughout bilateral breasts. No palpable axillary supraclavicular or infraclavicular adenopathy no breast tenderness or nipple discharge. (exam performed in the presence of a chaperone)  LABORATORY DATA:  I have reviewed the data as listed   Chemistry      Component Value Date/Time   NA 140 09/22/2014 0435   NA 143 07/21/2014 1517   K 4.0 09/22/2014 0435   K 3.6 07/21/2014 1517   CL 104 09/22/2014 0435   CL 105 12/26/2011 1538   CO2 25 09/22/2014 0435   CO2 27 07/21/2014 1517   BUN 9 09/22/2014 0435   BUN 8.2 07/21/2014 1517   CREATININE 0.62 09/22/2014 0435   CREATININE 0.7 07/21/2014 1517      Component Value Date/Time   CALCIUM 8.7* 09/22/2014 0435   CALCIUM 8.6 07/21/2014 1517   ALKPHOS 72 07/21/2014 1517   ALKPHOS 94 01/06/2013 1835   AST 21 07/21/2014 1517   AST 16 01/06/2013 1835   ALT 23 07/21/2014 1517   ALT 20 01/06/2013 1835   BILITOT 0.20 07/21/2014 1517   BILITOT  0.1* 01/06/2013 1835       Lab Results  Component Value Date   WBC 7.7 07/21/2015   HGB 11.6 07/21/2015   HCT 37.6 07/21/2015   MCV 84.9 07/21/2015   PLT 319 07/21/2015   NEUTROABS 4.2 07/21/2015   ASSESSMENT & PLAN:  Breast cancer of upper-inner quadrant of left female breast ([place]) Left breast invasive ductal carcinoma grade 1 ER 77% PR 96% gases on 5% HER-2 negative, T1 AN0M0 stage I A. status post lumpectomy 2.7 2009 followed by radiation therapy completed 10/10/2007, tamoxifen started September 2009 discontinued March 2012 followed by recurrence in the right breast as DCIS 05/26/2010 completed another lumpectomy and radiation therapy and she has been on tamoxifen ever since.  Tamoxifen counseling: Patient is tolerating it very well without any major problems. she will complete 5 yrs of tamoxifen  therapy in July 2017. We sent for breast cancer index and there was insufficient tissue. Patient decided to continue it for 5 more years.  Breast Cancer Surveillance: 1. Breast exam 07/21/2015: Normal, fibromyalgia causing breast tenderness. There are no nodules or lumps to warrant any additional investigation at this time. 2. Mammogram 07/02/2014 No abnormalities. Postsurgical changes. Breast Density Category C. I recommended that she get 3-D mammograms for surveillance. She will need to be scheduled for another mammogram. I Sent in order to breast center. Discussed the differences between different breast density categories.  No orders of the defined types were placed in this encounter.   The patient has a good understanding of the overall plan. she agrees with it. she will call with any problems that may develop before the next visit here.   Rulon Eisenmenger, MD 07/21/2015

## 2015-07-21 NOTE — Progress Notes (Signed)
Unable to get in to exam room prior to MD.  No assessment performed.  

## 2015-07-21 NOTE — Assessment & Plan Note (Signed)
Left breast invasive ductal carcinoma grade 1 ER 77% PR 96% gases on 5% HER-2 negative, T1 AN0M0 stage I A. status post lumpectomy 2.7 2009 followed by radiation therapy completed 10/10/2007, tamoxifen started September 2009 discontinued March 2012 followed by recurrence in the right breast as DCIS 05/26/2010 completed another lumpectomy and radiation therapy and she has been on tamoxifen ever since.  Tamoxifen counseling: Patient is tolerating it very well without any major problems. she will complete 5 yrs of tamoxifen therapy in July 2017. We sent for breast cancer index and there was insufficient tissue. Patient decided to continue it for 5 more years.  Breast Cancer Surveillance: 1. Breast exam 07/21/2015: Normal, fibromyalgia causing breast tenderness. There are no nodules or lumps to warrant any additional investigation at this time. 2. Mammogram 07/02/2014 No abnormalities. Postsurgical changes. Breast Density Category C. I recommended that she get 3-D mammograms for surveillance. She will need to be scheduled for another mammogram Discussed the differences between different breast density categories.

## 2015-07-25 ENCOUNTER — Other Ambulatory Visit: Payer: Self-pay | Admitting: Hematology and Oncology

## 2015-07-26 NOTE — Telephone Encounter (Signed)
Chart reviewed.

## 2015-08-10 ENCOUNTER — Ambulatory Visit: Payer: PPO

## 2015-08-26 ENCOUNTER — Ambulatory Visit: Payer: PPO

## 2015-09-16 ENCOUNTER — Ambulatory Visit: Payer: PPO

## 2015-09-16 ENCOUNTER — Encounter (HOSPITAL_COMMUNITY): Payer: Self-pay | Admitting: Oncology

## 2015-09-16 ENCOUNTER — Emergency Department (HOSPITAL_COMMUNITY): Payer: PPO

## 2015-09-16 ENCOUNTER — Emergency Department (HOSPITAL_COMMUNITY)
Admission: EM | Admit: 2015-09-16 | Discharge: 2015-09-16 | Disposition: A | Discharge status: Home / Self Care | Payer: PPO | Attending: Emergency Medicine | Admitting: Emergency Medicine

## 2015-09-16 DIAGNOSIS — N390 Urinary tract infection, site not specified: Secondary | ICD-10-CM | POA: Diagnosis not present

## 2015-09-16 DIAGNOSIS — F17210 Nicotine dependence, cigarettes, uncomplicated: Secondary | ICD-10-CM | POA: Diagnosis not present

## 2015-09-16 DIAGNOSIS — K449 Diaphragmatic hernia without obstruction or gangrene: Secondary | ICD-10-CM | POA: Diagnosis not present

## 2015-09-16 DIAGNOSIS — Z79899 Other long term (current) drug therapy: Secondary | ICD-10-CM | POA: Insufficient documentation

## 2015-09-16 DIAGNOSIS — K469 Unspecified abdominal hernia without obstruction or gangrene: Secondary | ICD-10-CM | POA: Diagnosis not present

## 2015-09-16 DIAGNOSIS — Z7982 Long term (current) use of aspirin: Secondary | ICD-10-CM | POA: Insufficient documentation

## 2015-09-16 DIAGNOSIS — K439 Ventral hernia without obstruction or gangrene: Secondary | ICD-10-CM | POA: Insufficient documentation

## 2015-09-16 DIAGNOSIS — Z853 Personal history of malignant neoplasm of breast: Secondary | ICD-10-CM | POA: Diagnosis not present

## 2015-09-16 DIAGNOSIS — R109 Unspecified abdominal pain: Secondary | ICD-10-CM | POA: Diagnosis not present

## 2015-09-16 LAB — URINALYSIS, ROUTINE W REFLEX MICROSCOPIC
Bilirubin Urine: NEGATIVE
Glucose, UA: NEGATIVE mg/dL
Hgb urine dipstick: NEGATIVE
Ketones, ur: NEGATIVE mg/dL
Leukocytes, UA: NEGATIVE
Nitrite: NEGATIVE
Protein, ur: NEGATIVE mg/dL
Specific Gravity, Urine: 1.034 — ABNORMAL HIGH (ref 1.005–1.030)
pH: 6 (ref 5.0–8.0)

## 2015-09-16 LAB — CBC WITH DIFFERENTIAL/PLATELET
Basophils Absolute: 0 10*3/uL (ref 0.0–0.1)
Basophils Relative: 0 %
Eosinophils Absolute: 0.3 10*3/uL (ref 0.0–0.7)
Eosinophils Relative: 3 %
HCT: 37.6 % (ref 36.0–46.0)
Hemoglobin: 11.8 g/dL — ABNORMAL LOW (ref 12.0–15.0)
Lymphocytes Relative: 39 %
Lymphs Abs: 3.8 10*3/uL (ref 0.7–4.0)
MCH: 26.5 pg (ref 26.0–34.0)
MCHC: 31.4 g/dL (ref 30.0–36.0)
MCV: 84.5 fL (ref 78.0–100.0)
Monocytes Absolute: 0.5 10*3/uL (ref 0.1–1.0)
Monocytes Relative: 5 %
Neutro Abs: 5 10*3/uL (ref 1.7–7.7)
Neutrophils Relative %: 53 %
Platelets: 306 10*3/uL (ref 150–400)
RBC: 4.45 MIL/uL (ref 3.87–5.11)
RDW: 14.8 % (ref 11.5–15.5)
WBC: 9.6 10*3/uL (ref 4.0–10.5)

## 2015-09-16 LAB — BASIC METABOLIC PANEL
Anion gap: 5 (ref 5–15)
BUN: 13 mg/dL (ref 6–20)
CO2: 25 mmol/L (ref 22–32)
Calcium: 8.1 mg/dL — ABNORMAL LOW (ref 8.9–10.3)
Chloride: 107 mmol/L (ref 101–111)
Creatinine, Ser: 0.7 mg/dL (ref 0.44–1.00)
GFR calc Af Amer: >60 mL/min (ref >60)
GFR calc non Af Amer: >60 mL/min (ref >60)
Glucose, Bld: 137 mg/dL — ABNORMAL HIGH (ref 65–99)
Potassium: 3.8 mmol/L (ref 3.5–5.1)
Sodium: 137 mmol/L (ref 135–145)

## 2015-09-16 MED ORDER — HYDROCODONE-ACETAMINOPHEN 5-325 MG PO TABS: 1.0000 | ORAL_TABLET | ORAL | Status: DC | PRN

## 2015-09-16 MED ORDER — KETOROLAC TROMETHAMINE 30 MG/ML IJ SOLN
30.0000 mg | Freq: Once | INTRAMUSCULAR | Status: AC
  Administered 2015-09-16: 30 mg via INTRAVENOUS
  Filled 2015-09-16: qty 1

## 2015-09-16 MED ORDER — OXYBUTYNIN CHLORIDE ER 10 MG PO TB24: 10.0000 mg | ORAL_TABLET | Freq: Every day | ORAL | Status: DC

## 2015-09-16 NOTE — ED Provider Notes (Addendum)
CSN: [id number]     Arrival date & time 09/16/15  1920 History   First MD Initiated Contact with Patient 09/16/15 2050     Chief Complaint  Patient presents with  . Urinary Tract Infection   HPI Patient presents to the emergency room with complaints of pain in her vaginal area. Patient states it's sharp stabbing pain that started a couple of days ago. She's had some urinary frequency but denies any trouble with blood in her urine.  No swelling in her vagina.  No injuries. Patient denies any trouble with nausea or vomiting. No fever. She is concerned that she might have a urinary tract infection. Past Medical History  Diagnosis Date  . Abdominal cramps   . Muscle spasms of head and/or neck     following to the breast  . Fatigue   . Passed out     4th of july  . Migraines   . Chronic headaches   . Arthritis   . Cancer [facility])     s/p radiation- last dose 6/12//has been off Tamoxifen 8/12  . Sleep apnea     STOP BANG SCORE 5  . Lymphedema     RIGHT ARM-////  STATES USE LEFT ARM FOR BP'S  . S/P radiation therapy 08/19/07 - 10/10/07    Left Breast/5040 cGy/28 fractions with Boost for a toatl dose of 6300 dGy  . S/P radiation therapy 08/14/10 -10/03/10    right breast  . Costochondritis   . Fibromyalgia 03/06/2013   Past Surgical History  Procedure Laterality Date  . Breast lumpectomy  2009/2012    LEFT/RIGHT with lymph node dissection  . Abdominal hysterectomy      with left salpingooophorectomy  . Knee arthroscopy      right  . Oophorectomy  2013   Family History  Problem Relation Age of Onset  . Breast cancer Mother 2  . Heart disease Father   . Prostate cancer Maternal Uncle 78  . Cancer Maternal Aunt 63    d. from "female cancer" possibly cervical  . Prostate cancer Paternal Uncle   . Prostate cancer Maternal Grandfather 50  . Prostate cancer Maternal Uncle 80  . Prostate cancer Paternal Uncle   . Breast cancer Cousin 28    mat 1st cousin   Social History  Substance  Use Topics  . Smoking status: Current Every Day Smoker -- 0.25 packs/day for 1 years    Types: Cigarettes  . Smokeless tobacco: Never Used  . Alcohol Use: Yes     Comment: 3 x a week   OB History    No data available     Review of Systems  All other systems reviewed and are negative.     Allergies  Aspirin and Penicillins  Home Medications   Prior to Admission medications   Medication Sig Start Date End Date Taking? Authorizing Provider  Acetaminophen (ARTHRITIS PAIN PO) Take 650 mg by mouth daily as needed (pain).    Yes Historical Provider, MD  aspirin 81 MG tablet Take 81 mg by mouth daily.   Yes Historical Provider, MD  aspirin-acetaminophen-caffeine (EXCEDRIN MIGRAINE) [phone] MG per tablet Take 1 tablet by mouth every 6 (six) hours as needed for migraine.    Yes Historical Provider, MD  Bisacodyl (DULCOLAX PO) Take 1 capsule by mouth daily.   Yes Historical Provider, MD  cholecalciferol 5000 units TABS Take 1 tablet (5,000 Units total) by mouth 2 (two) times daily. 07/21/15  Yes Nicholas Lose, MD  citalopram (  CELEXA) 20 MG tablet Take 20 mg by mouth daily.  05/07/15  Yes Historical Provider, MD  cyclobenzaprine (FLEXERIL) 10 MG tablet Take 10 mg by mouth 3 (three) times daily as needed for muscle spasms.    Yes Historical Provider, MD  diphenhydrAMINE (BENADRYL) 25 MG tablet Take 25 mg by mouth every 6 (six) hours as needed for allergies.    Yes Historical Provider, MD  fish oil-omega-3 fatty acids 1000 MG capsule Take 1 g by mouth daily.    Yes Historical Provider, MD  ibuprofen (ADVIL,MOTRIN) 800 MG tablet Take 800 mg by mouth every 8 (eight) hours as needed for moderate pain. For pain   Yes Historical Provider, MD  lisinopril (PRINIVIL,ZESTRIL) 10 MG tablet Take 10 mg by mouth daily.   Yes Historical Provider, MD  meloxicam (MOBIC) 7.5 MG tablet Take 7.5 mg by mouth daily. 05/28/15  Yes Historical Provider, MD  Multiple Vitamins-Minerals (MULTIVITAMIN & MINERAL PO) Take 1  tablet by mouth daily.   Yes Historical Provider, MD  pregabalin (LYRICA) 75 MG capsule Take 1 capsule (75 mg total) by mouth 2 (two) times daily. 03/14/12  Yes Eston Esters, MD  propranolol ER (INDERAL LA) 80 MG 24 hr capsule Take 1 capsule (80 mg total) by mouth daily. 05/16/15  Yes Nicholas Lose, MD  senna (SENOKOT) 8.6 MG tablet Take 1 tablet by mouth daily as needed for constipation.    Yes Historical Provider, MD  tamoxifen (NOLVADEX) 20 MG tablet TAKE 1 TABLET (20 MG TOTAL) BY MOUTH DAILY. 07/26/15  Yes Nicholas Lose, MD  venlafaxine XR (EFFEXOR-XR) 37.5 MG 24 hr capsule Take 1 capsule (37.5 mg total) by mouth daily with breakfast. 05/16/15  Yes Nicholas Lose, MD  UNABLE TO FIND Rx: L8000-Post Surgical Bra (Quantity: 6) [id number]- Silicone Breast Prosthesis (Quantity: 2) Dx: 174.9; Bilateral partial mastectomy Replacement due to hot flashes since previous items were too heavy and hot 01/20/13   Fanny Skates, MD   BP 160/80 mmHg  Pulse 93  Temp(Src) 98 F (36.7 C) (Oral)  Resp 20  Ht 5\' 2"  (1.575 m)  Wt 109.317 kg  BMI 44.07 kg/m2  SpO2 95% Physical Exam  Constitutional: No distress.  HENT:  Head: Normocephalic and atraumatic.  Right Ear: External ear normal.  Left Ear: External ear normal.  Eyes: Conjunctivae are normal. Right eye exhibits no discharge. Left eye exhibits no discharge. No scleral icterus.  Neck: Neck supple. No tracheal deviation present.  Cardiovascular: Normal rate, regular rhythm and intact distal pulses.   Pulmonary/Chest: Effort normal and breath sounds normal. No stridor. No respiratory distress. She has no wheezes. She has no rales.  Abdominal: Soft. Bowel sounds are normal. She exhibits no distension. There is tenderness in the suprapubic area. There is no rebound and no guarding. Hernia confirmed negative in the right inguinal area and confirmed negative in the left inguinal area.  Genitourinary: Vagina normal.  Musculoskeletal: She exhibits no edema or  tenderness.  Neurological: She is alert. She has normal strength. No cranial nerve deficit (no facial droop, extraocular movements intact, no slurred speech) or sensory deficit. She exhibits normal muscle tone. She displays no seizure activity. Coordination normal.  Skin: Skin is warm and dry. No rash noted.  Psychiatric: She has a normal mood and affect.  Nursing note and vitals reviewed.   ED Course  Procedures (including critical care time) Labs Review Labs Reviewed  URINALYSIS, ROUTINE W REFLEX MICROSCOPIC (NOT AT [facility]) - Abnormal; Notable for the following:  Color, Urine AMBER (*)    APPearance CLOUDY (*)    Specific Gravity, Urine 1.034 (*)    All other components within normal limits  CBC WITH DIFFERENTIAL/PLATELET  BASIC METABOLIC PANEL  RPR  HIV ANTIBODY (ROUTINE TESTING)  GC/CHLAMYDIA PROBE AMP ([place]) NOT AT [facility]      MDM    Pt has had a total hysterectomy.  No vaginal abnormalities.   LAbs do not suggest a UTI.  Will ct scan to evaluate for ureteral stone considering her sharp stabbing pain.  CT scan pending.    Dorie Rank, MD 09/16/15 2250  CT scan without acute findings other than abdominal wall hernia.  No sign of incarceration.  Will dc home with pain meds.  Urine culture sent off.  Follow up with PCP  Dorie Rank, MD 09/16/15 [phone]

## 2015-09-16 NOTE — Discharge Instructions (Signed)

## 2015-09-16 NOTE — ED Notes (Addendum)
Per pt she c/o urinary frequency, vaginal pain and blood in her urine.  Pt took azo yesterday w/ some relief.  Pt also took 10 flexeril and 800 mg ibuprofen 2 hours PTA w/o improvement in pain.  2 months ago pt had similar sx and was dx w/ UTI states that this feels the same.  Rates pain to vagina 10/10, pulling and aching in nature.  Pt was taken off tamoxifen after the last UTI d/t suspicion that is was causing UTI however restarted on it the beginning of May.

## 2015-09-17 LAB — RPR: RPR Ser Ql: NONREACTIVE

## 2015-09-17 LAB — HIV ANTIBODY (ROUTINE TESTING W REFLEX): HIV Screen 4th Generation wRfx: NONREACTIVE

## 2015-09-18 LAB — URINE CULTURE

## 2015-09-28 ENCOUNTER — Telehealth: Payer: Self-pay | Admitting: *Deleted

## 2015-09-28 ENCOUNTER — Other Ambulatory Visit: Payer: Self-pay | Admitting: *Deleted

## 2015-09-28 DIAGNOSIS — C50212 Malignant neoplasm of upper-inner quadrant of left female breast: Secondary | ICD-10-CM

## 2015-09-28 DIAGNOSIS — E782 Mixed hyperlipidemia: Secondary | ICD-10-CM | POA: Diagnosis not present

## 2015-09-28 DIAGNOSIS — M25562 Pain in left knee: Secondary | ICD-10-CM | POA: Diagnosis not present

## 2015-09-28 DIAGNOSIS — I10 Essential (primary) hypertension: Secondary | ICD-10-CM | POA: Diagnosis not present

## 2015-09-28 DIAGNOSIS — R3915 Urgency of urination: Secondary | ICD-10-CM | POA: Diagnosis not present

## 2015-09-28 MED ORDER — VENLAFAXINE HCL ER 75 MG PO CP24: 75.0000 mg | ORAL_CAPSULE | Freq: Every day | ORAL | Status: DC

## 2015-09-28 NOTE — Telephone Encounter (Signed)
Pt called requesting dose increase of Venlafaxine.   Currently, pt is taking Venlafaxine 37.5 mg  BID.  Stated she is tolerating med fine except requent refills requirement.  Dr. Lindi Adie notified.  OK to increase dose to 75 mg daily as per md.  New script sent to pt's pharmacy. Pt's   Phone     [phone].

## 2015-10-03 ENCOUNTER — Inpatient Hospital Stay: Admission: RE | Admit: 2015-10-03 | Payer: PPO | Source: Ambulatory Visit

## 2015-10-31 ENCOUNTER — Telehealth: Payer: Self-pay

## 2015-10-31 NOTE — Telephone Encounter (Signed)
I let pt know her operative clearance form is completed.  Pt requested it be faxed to [employer].  Done and sent to scan.  Per Dr. Lindi Adie, pt advised to hold tamoxifen until 2 weeks after surgery.  Pt voiced understanding.

## 2015-11-14 ENCOUNTER — Ambulatory Visit: Payer: Self-pay | Admitting: Hematology and Oncology

## 2015-11-25 DIAGNOSIS — M1712 Unilateral primary osteoarthritis, left knee: Secondary | ICD-10-CM | POA: Diagnosis not present

## 2015-11-25 DIAGNOSIS — C50912 Malignant neoplasm of unspecified site of left female breast: Secondary | ICD-10-CM | POA: Diagnosis not present

## 2015-11-25 DIAGNOSIS — C50911 Malignant neoplasm of unspecified site of right female breast: Secondary | ICD-10-CM | POA: Diagnosis not present

## 2015-12-09 ENCOUNTER — Ambulatory Visit: Payer: PPO

## 2015-12-09 DIAGNOSIS — M25561 Pain in right knee: Secondary | ICD-10-CM | POA: Diagnosis not present

## 2015-12-09 DIAGNOSIS — M170 Bilateral primary osteoarthritis of knee: Secondary | ICD-10-CM | POA: Diagnosis not present

## 2015-12-15 ENCOUNTER — Ambulatory Visit: Payer: PPO

## 2015-12-22 DIAGNOSIS — J209 Acute bronchitis, unspecified: Secondary | ICD-10-CM | POA: Diagnosis not present

## 2015-12-22 DIAGNOSIS — I10 Essential (primary) hypertension: Secondary | ICD-10-CM | POA: Diagnosis not present

## 2015-12-28 ENCOUNTER — Other Ambulatory Visit: Payer: Self-pay | Admitting: Nurse Practitioner

## 2015-12-28 ENCOUNTER — Ambulatory Visit
Admission: RE | Admit: 2015-12-28 | Discharge: 2015-12-28 | Disposition: A | Discharge status: Home / Self Care | Payer: PPO | Source: Ambulatory Visit | Attending: Nurse Practitioner | Admitting: Nurse Practitioner

## 2015-12-28 DIAGNOSIS — R05 Cough: Secondary | ICD-10-CM

## 2015-12-28 DIAGNOSIS — R059 Cough, unspecified: Secondary | ICD-10-CM

## 2016-01-02 ENCOUNTER — Telehealth: Payer: Self-pay | Admitting: *Deleted

## 2016-01-02 NOTE — Telephone Encounter (Signed)
TC from pt requesting refill for her Effexor.  She states that her pharmacist told her it was ok to take 1 75 mg tablet 2-3 x a day, instead of 1 x a day. Pt las thad it filled on 12/19/15 and is now out of this medication. Advised pt that only her physician should be telling her to increase her medication not a pharmacist. Pt voiced understanding. Advised pt that I would let Dr. Lindi Adie know of the situation and have him advise on appropriate course of action.

## 2016-01-03 ENCOUNTER — Other Ambulatory Visit: Payer: Self-pay

## 2016-01-03 DIAGNOSIS — C50212 Malignant neoplasm of upper-inner quadrant of left female breast: Secondary | ICD-10-CM

## 2016-01-03 MED ORDER — VENLAFAXINE HCL ER 75 MG PO CP24: 75.0000 mg | ORAL_CAPSULE | Freq: Every day | ORAL | 6 refills | Status: DC

## 2016-01-09 ENCOUNTER — Telehealth: Payer: Self-pay

## 2016-01-09 NOTE — Telephone Encounter (Signed)
Message to schedulers for injection appt.

## 2016-01-09 NOTE — Telephone Encounter (Signed)
Pt called to see if she had to make an appt for flu shot. Suggested she could go to pharmacy or make appt her. She is leary of going to pharmacy.  She is on tamoxifen.

## 2016-01-25 ENCOUNTER — Ambulatory Visit (INDEPENDENT_AMBULATORY_CARE_PROVIDER_SITE_OTHER): Payer: Medicaid Other | Admitting: Orthopedic Surgery

## 2016-01-26 ENCOUNTER — Other Ambulatory Visit: Payer: Self-pay

## 2016-01-26 ENCOUNTER — Ambulatory Visit (HOSPITAL_BASED_OUTPATIENT_CLINIC_OR_DEPARTMENT_OTHER): Payer: PPO

## 2016-01-26 VITALS — BP 155/90 | HR 99 | Temp 98.1°F | Resp 22

## 2016-01-26 DIAGNOSIS — Z23 Encounter for immunization: Secondary | ICD-10-CM

## 2016-01-26 DIAGNOSIS — C50212 Malignant neoplasm of upper-inner quadrant of left female breast: Secondary | ICD-10-CM

## 2016-01-26 MED ORDER — INFLUENZA VAC SPLIT QUAD 0.5 ML IM SUSY
0.5000 mL | PREFILLED_SYRINGE | Freq: Once | INTRAMUSCULAR | Status: AC
  Administered 2016-01-26: 0.5 mL via INTRAMUSCULAR
  Filled 2016-01-26: qty 0.5

## 2016-01-26 MED ORDER — VENLAFAXINE HCL ER 75 MG PO CP24: 75.0000 mg | ORAL_CAPSULE | Freq: Every day | ORAL | 0 refills | Status: DC

## 2016-01-26 NOTE — Patient Instructions (Signed)

## 2016-02-01 ENCOUNTER — Ambulatory Visit (INDEPENDENT_AMBULATORY_CARE_PROVIDER_SITE_OTHER): Payer: Medicaid Other | Admitting: Orthopedic Surgery

## 2016-02-08 ENCOUNTER — Ambulatory Visit (INDEPENDENT_AMBULATORY_CARE_PROVIDER_SITE_OTHER): Payer: Medicaid Other | Admitting: Orthopedic Surgery

## 2016-02-24 ENCOUNTER — Ambulatory Visit: Payer: Medicaid Other

## 2016-02-27 ENCOUNTER — Inpatient Hospital Stay: Admission: RE | Admit: 2016-02-27 | Payer: PPO | Source: Ambulatory Visit

## 2016-03-06 ENCOUNTER — Other Ambulatory Visit: Payer: Self-pay | Admitting: Hematology and Oncology

## 2016-03-06 DIAGNOSIS — C50212 Malignant neoplasm of upper-inner quadrant of left female breast: Secondary | ICD-10-CM

## 2016-03-07 ENCOUNTER — Other Ambulatory Visit: Payer: Self-pay

## 2016-03-07 ENCOUNTER — Telehealth: Payer: Self-pay | Admitting: *Deleted

## 2016-03-07 DIAGNOSIS — C50212 Malignant neoplasm of upper-inner quadrant of left female breast: Secondary | ICD-10-CM

## 2016-03-07 MED ORDER — VENLAFAXINE HCL ER 150 MG PO CP24: 150.0000 mg | ORAL_CAPSULE | Freq: Every day | ORAL | 0 refills | Status: DC

## 2016-03-07 MED ORDER — VENLAFAXINE HCL ER 75 MG PO CP24: 75.0000 mg | ORAL_CAPSULE | Freq: Two times a day (BID) | ORAL | 0 refills | Status: DC

## 2016-03-07 NOTE — Telephone Encounter (Signed)
Received call @ 1025 In regards to pt. Needing a refill on her  EFFEXOR-XR 75mg 

## 2016-03-07 NOTE — Telephone Encounter (Signed)
Called pt back to let her know that pt can pick up the effexor prescription and told pt to take once a day before bedtime. Pt verbalized understanding and confirmed with pt that she will be given a 90 day capsule for venlafaxine medication. No further questions at this time.

## 2016-03-07 NOTE — Progress Notes (Signed)
Pt called regarding her effexor refill. Pt stated that she went to fill her Effexor XR at her pharmacy and had no more refill. Called pt to clarify that she should have 90 capsule dose to last her for 90 days. Pt stated that she thought she was supposed to take 2x a day. Pt states " I am actually feeling so much better with taking it 2x a day and yesterday was my last dose. I can usually tell when its coming out of my system." filled pt prescription as requested. Sent to pharmacy. Will call pt back to confirm refill.

## 2016-03-07 NOTE — Progress Notes (Signed)
Please see previous note.

## 2016-03-07 NOTE — Telephone Encounter (Addendum)
"  I'm unable to get Venlafaxine and am completely out.  Insurance will not cover.  Too early per [facility]."  [facility].  This is the same strength.  01-27-2016 ninety pills dispensed for once daily.  Suggest ordering 150 mg daily instead of the 75 mg twice a day. Message left for collaborative.   1435- Called to clarify with her pharmacy and was told that her insurance would not cover it because she still had 1 month remaining before it can be filled again. Discontinued order for 75mg  bid and changed to 150 at bedtime. Will follow up with pharmacy to see if orders went through with insurance and will confirm with pt for pick today. Pt anxious that she is completely out of her dose.

## 2016-03-19 DIAGNOSIS — R21 Rash and other nonspecific skin eruption: Secondary | ICD-10-CM | POA: Diagnosis not present

## 2016-03-19 DIAGNOSIS — G43909 Migraine, unspecified, not intractable, without status migrainosus: Secondary | ICD-10-CM | POA: Diagnosis not present

## 2016-03-28 ENCOUNTER — Ambulatory Visit: Payer: PPO

## 2016-04-23 ENCOUNTER — Other Ambulatory Visit: Payer: Self-pay | Admitting: Hematology and Oncology

## 2016-04-26 ENCOUNTER — Ambulatory Visit: Payer: PPO

## 2016-04-30 ENCOUNTER — Telehealth: Payer: Self-pay | Admitting: Emergency Medicine

## 2016-04-30 NOTE — Telephone Encounter (Signed)
Patient called with concerns about her medication. States "something else is going on". Returned patient's call and left message on her voicemail.

## 2016-06-11 ENCOUNTER — Ambulatory Visit: Payer: PPO

## 2016-06-25 ENCOUNTER — Ambulatory Visit: Payer: PPO

## 2016-06-28 ENCOUNTER — Telehealth: Payer: Self-pay | Admitting: Hematology and Oncology

## 2016-06-28 NOTE — Telephone Encounter (Signed)
sw pt pt confirm r/s appt due to MD call day 3/29. Pt requested 3/26 at 845 am

## 2016-07-11 ENCOUNTER — Ambulatory Visit: Payer: PPO

## 2016-07-16 ENCOUNTER — Ambulatory Visit: Payer: Self-pay | Admitting: Hematology and Oncology

## 2016-07-16 NOTE — Assessment & Plan Note (Deleted)
Left breast invasive ductal carcinoma grade 1 ER 77% PR 96% gases on 5% HER-2 negative, T1 AN0M0 stage I A. status post lumpectomy 2.7 2009 followed by radiation therapy completed 10/10/2007, tamoxifen started September 2009 discontinued March 2012 followed by recurrence in the right breast as DCIS 05/26/2010 completed another lumpectomy and radiation therapy and she has been on tamoxifen ever since.  Tamoxifen toxicities: Patient is tolerating it very well without any major problems. she completed 5 yrs of tamoxifen therapy in July 2017. We sent for breast cancer index and there was insufficient tissue. Patient decided to continue it for 5 more years.  Breast Cancer Surveillance: 1. Breast exam 07/16/2016: Normal, fibromyalgia causing breast tenderness. There are no nodules or lumps to warrant any additional investigation at this time. 2. Mammogram 07/02/2015 No abnormalities. Postsurgical changes. Breast Density Category C.  Return to clinic in 1 year for follow-up

## 2016-07-19 ENCOUNTER — Ambulatory Visit: Payer: Self-pay | Admitting: Hematology and Oncology

## 2016-07-19 DIAGNOSIS — M797 Fibromyalgia: Secondary | ICD-10-CM | POA: Diagnosis not present

## 2016-07-19 DIAGNOSIS — E669 Obesity, unspecified: Secondary | ICD-10-CM | POA: Diagnosis not present

## 2016-07-19 DIAGNOSIS — Z1231 Encounter for screening mammogram for malignant neoplasm of breast: Secondary | ICD-10-CM | POA: Diagnosis not present

## 2016-07-19 DIAGNOSIS — Z0000 Encounter for general adult medical examination without abnormal findings: Secondary | ICD-10-CM | POA: Diagnosis not present

## 2016-07-19 DIAGNOSIS — E782 Mixed hyperlipidemia: Secondary | ICD-10-CM | POA: Diagnosis not present

## 2016-07-19 DIAGNOSIS — G43909 Migraine, unspecified, not intractable, without status migrainosus: Secondary | ICD-10-CM | POA: Diagnosis not present

## 2016-07-19 DIAGNOSIS — Z6841 Body Mass Index (BMI) 40.0 and over, adult: Secondary | ICD-10-CM | POA: Diagnosis not present

## 2016-07-19 DIAGNOSIS — I10 Essential (primary) hypertension: Secondary | ICD-10-CM | POA: Diagnosis not present

## 2016-07-19 DIAGNOSIS — F329 Major depressive disorder, single episode, unspecified: Secondary | ICD-10-CM | POA: Diagnosis not present

## 2016-07-24 ENCOUNTER — Other Ambulatory Visit: Payer: Self-pay

## 2016-07-24 MED ORDER — VENLAFAXINE HCL ER 150 MG PO CP24: 150.0000 mg | ORAL_CAPSULE | Freq: Every day | ORAL | 0 refills | Status: DC

## 2016-08-01 ENCOUNTER — Ambulatory Visit: Payer: PPO

## 2016-08-02 ENCOUNTER — Encounter (INDEPENDENT_AMBULATORY_CARE_PROVIDER_SITE_OTHER): Payer: Self-pay | Admitting: Orthopaedic Surgery

## 2016-08-02 ENCOUNTER — Ambulatory Visit (INDEPENDENT_AMBULATORY_CARE_PROVIDER_SITE_OTHER): Payer: PPO

## 2016-08-02 ENCOUNTER — Ambulatory Visit (INDEPENDENT_AMBULATORY_CARE_PROVIDER_SITE_OTHER): Payer: PPO | Admitting: Orthopaedic Surgery

## 2016-08-02 VITALS — BP 127/107 | HR 101 | Ht 64.0 in | Wt 241.0 lb

## 2016-08-02 DIAGNOSIS — M25562 Pain in left knee: Secondary | ICD-10-CM

## 2016-08-02 DIAGNOSIS — G8929 Other chronic pain: Secondary | ICD-10-CM | POA: Diagnosis not present

## 2016-08-02 DIAGNOSIS — M25561 Pain in right knee: Secondary | ICD-10-CM | POA: Diagnosis not present

## 2016-08-02 MED ORDER — BUPIVACAINE HCL 0.5 % IJ SOLN
3.0000 mL | INTRAMUSCULAR | Status: AC | PRN
  Administered 2016-08-02: 3 mL via INTRA_ARTICULAR

## 2016-08-02 MED ORDER — METHYLPREDNISOLONE ACETATE 40 MG/ML IJ SUSP
80.0000 mg | INTRAMUSCULAR | Status: AC | PRN
  Administered 2016-08-02: 80 mg

## 2016-08-02 MED ORDER — LIDOCAINE HCL 1 % IJ SOLN
5.0000 mL | INTRAMUSCULAR | Status: AC | PRN
  Administered 2016-08-02: 5 mL

## 2016-08-02 NOTE — Progress Notes (Signed)
Office Visit Note   Patient: Ashley Lindsey           Date of Birth: 12/31/1960           MRN: [id number] Visit Date: 08/02/2016              Requested by: [name], MD [address] [place], [place] [address] PCP: Maximino Greenland, MD   Assessment & Plan: Visit Diagnoses:  1. Chronic pain of left knee   2. Chronic pain of right knee   End-stage osteoarthritis both knees  Plan: Cortisone injection right knee. Long discussion with Ashley Lindsey and Ashley Lindsey regarding Ashley weight and how it impacts Ashley arthritis. Ashley BMI is close to 50. She does have history of depression with recent weight gain. Return to the office in 1 month and consider injecting the left knee. Also long discussion about knee replacement and Ashley significant risk based on Ashley weight. We talked about exercising and appropriate to nutrition. Lindsey is completely aware of all of the above and will be helpful. Office visit over 30 minutes with all of the above  Follow-Up Instructions: Return in about 1 month (around 09/01/2016).   Orders:  Orders Placed This Encounter  Procedures  . XR KNEE 3 VIEW RIGHT  . XR KNEE 3 VIEW LEFT   No orders of the defined types were placed in this encounter.     Procedures: Large Joint Inj Date/Time: 08/02/2016 4:14 PM Performed by: Garald Balding Authorized by: Garald Balding   Consent Given by:  Patient Timeout: prior to procedure the correct patient, procedure, and site was verified   Indications:  Pain and joint swelling Location:  Knee Site:  R knee Prep: patient was prepped and draped in usual sterile fashion   Needle Size:  25 G Needle Length:  1.5 inches Approach:  Anteromedial Ultrasound Guidance: No   Fluoroscopic Guidance: No   Arthrogram: No   Medications:  5 mL lidocaine 1 %; 80 mg methylPREDNISolone acetate 40 MG/ML; 3 mL bupivacaine 0.5 % Aspiration Attempted: No   Patient tolerance:  Patient tolerated the procedure well with no  immediate complications     Clinical Data: No additional findings.   Subjective: Chief Complaint  Patient presents with  . Right Knee - Pain, Numbness, Edema, Weakness  . Left Knee - Pain, Edema, Numbness, Weakness    Ashley Lindsey is a 56 year old female that presents with bilateral knee pain x years, but the pain is keeping Ashley aawke at night. She feels a "burning " sensation. Not diabetic. She relates she is swollen all over Ashley body.    HPI  Review of Systems   Objective: Vital Signs: BP (!) 127/107   Pulse (!) 101   Ht 5\' 4"  (1.626 m)   Wt 241 lb (109.3 kg)   BMI 41.37 kg/m   Physical Exam  Ortho Exam large knees. Mild loss of full extension bilaterally. Predominantly medial joint pain with increased varus bilaterally. Considerable  patellofemoral crepitation. No significant swelling of either ankle or foot. Good pulses. No popliteal pain. Approximately 100 of flexion when calf touches thigh. Age of motion of either hip. Predominantly medial joint pain bilaterally right greater than left. Possible small effusion bilaterally  Specialty Comments:  No specialty comments available.  Imaging: No results found.   PMFS History: Patient Active Problem List   Diagnosis Date Noted  . Breast cancer of lower-outer quadrant of right female breast ([place]) 12/02/2014  .  Influenzal bronchitis 09/22/2014  . Influenza B 09/22/2014  . Bronchospasm 09/21/2014  . Acute bronchitis 09/21/2014  . Hypoxia 09/21/2014  . Dyspnea 09/21/2014  . Leg pain 09/21/2014  . Fibromyalgia 03/06/2013  . Dense breasts 03/06/2013  . Mastalgia 03/06/2013  . Back pain, lumbosacral 03/06/2013  . Lymphedema of arm 03/06/2013  . Atypical chest pain 01/06/2013  . Breast cancer of upper-inner quadrant of left female breast ([place]) 09/08/2012  . Family history of malignant neoplasm of breast 09/08/2012  . S/P radiation therapy   . History of breast cancer in female 08/13/2011   Past Medical History:    Diagnosis Date  . Abdominal cramps   . Arthritis   . Cancer [facility])    s/p radiation- last dose 6/12//has been off Tamoxifen 8/12  . Chronic headaches   . Costochondritis   . Fatigue   . Fibromyalgia 03/06/2013  . Lymphedema    RIGHT ARM-////  STATES USE LEFT ARM FOR BP'S  . Migraines   . Muscle spasms of head and/or neck    following to the breast  . Passed out    4th of july  . S/P radiation therapy 08/19/07 - 10/10/07   Left Breast/5040 cGy/28 fractions with Boost for a toatl dose of 6300 dGy  . S/P radiation therapy 08/14/10 -10/03/10   right breast  . Sleep apnea    STOP BANG SCORE 5    Family History  Problem Relation Age of Onset  . Breast cancer Mother 78  . Heart disease Father   . Prostate cancer Maternal Uncle 78  . Cancer Maternal Aunt 63    d. from "female cancer" possibly cervical  . Prostate cancer Paternal Uncle   . Prostate cancer Maternal Grandfather 73  . Prostate cancer Maternal Uncle 80  . Prostate cancer Paternal Uncle   . Breast cancer Cousin 44    mat 1st cousin    Past Surgical History:  Procedure Laterality Date  . ABDOMINAL HYSTERECTOMY     with left salpingooophorectomy  . BREAST LUMPECTOMY  2009/2012   LEFT/RIGHT with lymph node dissection  . KNEE ARTHROSCOPY     right  . OOPHORECTOMY  2013   Social History   Occupational History  . Not on file.   Social History Main Topics  . Smoking status: Former Smoker    Packs/day: 0.25    Years: 1.00    Types: Cigarettes  . Smokeless tobacco: Never Used  . Alcohol use No     Comment: 3 x a week  . Drug use: No  . Sexual activity: No

## 2016-08-21 ENCOUNTER — Ambulatory Visit: Payer: PPO

## 2016-08-27 ENCOUNTER — Ambulatory Visit (INDEPENDENT_AMBULATORY_CARE_PROVIDER_SITE_OTHER): Payer: PPO | Admitting: Orthopaedic Surgery

## 2016-09-07 ENCOUNTER — Ambulatory Visit (HOSPITAL_COMMUNITY)
Admission: EM | Admit: 2016-09-07 | Discharge: 2016-09-07 | Disposition: A | Discharge status: Home / Self Care | Payer: PPO | Attending: Internal Medicine | Admitting: Internal Medicine

## 2016-09-07 ENCOUNTER — Encounter (HOSPITAL_COMMUNITY): Payer: Self-pay | Admitting: Emergency Medicine

## 2016-09-07 DIAGNOSIS — G43009 Migraine without aura, not intractable, without status migrainosus: Secondary | ICD-10-CM

## 2016-09-07 MED ORDER — KETOROLAC TROMETHAMINE 30 MG/ML IJ SOLN
30.0000 mg | Freq: Once | INTRAMUSCULAR | Status: AC
  Administered 2016-09-07: 30 mg via INTRAMUSCULAR

## 2016-09-07 MED ORDER — DEXAMETHASONE SODIUM PHOSPHATE 10 MG/ML IJ SOLN
INTRAMUSCULAR | Status: AC
  Filled 2016-09-07: qty 1

## 2016-09-07 MED ORDER — DEXAMETHASONE SODIUM PHOSPHATE 10 MG/ML IJ SOLN
10.0000 mg | Freq: Once | INTRAMUSCULAR | Status: AC
  Administered 2016-09-07: 10 mg via INTRAMUSCULAR

## 2016-09-07 MED ORDER — KETOROLAC TROMETHAMINE 10 MG PO TABS: 10.0000 mg | ORAL_TABLET | Freq: Four times a day (QID) | ORAL | 0 refills | Status: DC | PRN

## 2016-09-07 MED ORDER — KETOROLAC TROMETHAMINE 30 MG/ML IJ SOLN
INTRAMUSCULAR | Status: AC
  Filled 2016-09-07: qty 1

## 2016-09-07 NOTE — ED Triage Notes (Signed)
Pt here for intermittent HA onset 7 days associated w/neck stiffness  HA increases w/bright light and loud noises  Hx of fibromyalgia  Has been taking Excedrin w/no relief... sts she takes 6 tab daily  A&O x4... NAD... Ambulatory ... Talking in complete sentences

## 2016-09-07 NOTE — Discharge Instructions (Signed)
You've been given injection of Toradol, and Decadron here in the clinic, and I sent a prescription to your pharmacy for Toradol for pain. If her headache worsens, or fails to respond, follow-up with primary care provider, or go to the ER as needed.

## 2016-09-07 NOTE — ED Provider Notes (Signed)
CSN: [id number]     Arrival date & time 09/07/16  1003 History   First MD Initiated Contact with Patient 09/07/16 1041     Chief Complaint  Patient presents with  . Headache   (Consider location/radiation/quality/duration/timing/severity/associated sxs/prior Treatment) The history is provided by the patient.  Headache  Pain location:  Generalized Quality:  Sharp and dull Radiates to:  Does not radiate Severity currently:  8/10 Severity at highest:  10/10 Onset quality:  Gradual Duration:  7 days Timing:  Constant Progression:  Worsening Chronicity:  Recurrent Similar to prior headaches: yes   Context: activity, bright light and loud noise   Relieved by:  Nothing Worsened by:  Light, sound and activity Ineffective treatments:  Acetaminophen Associated symptoms: no abdominal pain, no blurred vision, no fatigue, no fever, no nausea, no numbness, no photophobia, no syncope and no vomiting     Past Medical History:  Diagnosis Date  . Abdominal cramps   . Arthritis   . Cancer [facility])    s/p radiation- last dose 6/12//has been off Tamoxifen 8/12  . Chronic headaches   . Costochondritis   . Fatigue   . Fibromyalgia 03/06/2013  . Lymphedema    RIGHT ARM-////  STATES USE LEFT ARM FOR BP'S  . Migraines   . Muscle spasms of head and/or neck    following to the breast  . Passed out    4th of july  . S/P radiation therapy 08/19/07 - 10/10/07   Left Breast/5040 cGy/28 fractions with Boost for a toatl dose of 6300 dGy  . S/P radiation therapy 08/14/10 -10/03/10   right breast  . Sleep apnea    STOP BANG SCORE 5   Past Surgical History:  Procedure Laterality Date  . ABDOMINAL HYSTERECTOMY     with left salpingooophorectomy  . BREAST LUMPECTOMY  2009/2012   LEFT/RIGHT with lymph node dissection  . KNEE ARTHROSCOPY     right  . OOPHORECTOMY  2013   Family History  Problem Relation Age of Onset  . Breast cancer Mother 62  . Heart disease Father   . Prostate cancer Maternal  Uncle 78  . Cancer Maternal Aunt 63       d. from "female cancer" possibly cervical  . Prostate cancer Paternal Uncle   . Prostate cancer Maternal Grandfather 81  . Prostate cancer Maternal Uncle 80  . Prostate cancer Paternal Uncle   . Breast cancer Cousin 58       mat 1st cousin   Social History  Substance Use Topics  . Smoking status: Former Smoker    Packs/day: 0.25    Years: 1.00    Types: Cigarettes  . Smokeless tobacco: Never Used  . Alcohol use No     Comment: 3 x a week   OB History    No data available     Review of Systems  Constitutional: Negative for chills, fatigue and fever.  Eyes: Negative for blurred vision and photophobia.  Respiratory: Negative.   Cardiovascular: Negative.  Negative for syncope.  Gastrointestinal: Negative for abdominal pain, nausea and vomiting.  Musculoskeletal: Negative.   Skin: Negative.   Neurological: Positive for headaches. Negative for light-headedness and numbness.    Allergies  Aspirin and Penicillins  Home Medications   Prior to Admission medications   Medication Sig Start Date End Date Taking? Authorizing Provider  Acetaminophen (ARTHRITIS PAIN PO) Take 650 mg by mouth daily as needed (pain).    Yes [provider]  aspirin 81 MG  tablet Take 81 mg by mouth daily.   Yes [provider]  aspirin-acetaminophen-caffeine (EXCEDRIN MIGRAINE) [phone] MG per tablet Take 1 tablet by mouth every 6 (six) hours as needed for migraine.    Yes [provider]  Bisacodyl (DULCOLAX PO) Take 1 capsule by mouth daily.   Yes [provider]  cholecalciferol 5000 units TABS Take 1 tablet (5,000 Units total) by mouth 2 (two) times daily. 07/21/15  Yes Nicholas Lose, MD  citalopram (CELEXA) 20 MG tablet Take 20 mg by mouth daily.  05/07/15  Yes [provider]  cyclobenzaprine (FLEXERIL) 10 MG tablet Take 10 mg by mouth 3 (three) times daily as needed for muscle spasms.    Yes [provider]  fish oil-omega-3 fatty acids 1000 MG capsule Take 1 g by mouth daily.    Yes [provider]  ibuprofen (ADVIL,MOTRIN) 800 MG tablet Take 800 mg by mouth every 8 (eight) hours as needed for moderate pain. For pain   Yes [provider]  Multiple Vitamins-Minerals (MULTIVITAMIN & MINERAL PO) Take 1 tablet by mouth daily.   Yes [provider]  pregabalin (LYRICA) 75 MG capsule Take 1 capsule (75 mg total) by mouth 2 (two) times daily. 03/14/12  Yes Eston Esters, MD  tamoxifen (NOLVADEX) 20 MG tablet TAKE 1 TABLET (20 MG TOTAL) BY MOUTH DAILY. 07/26/15  Yes Nicholas Lose, MD  diphenhydrAMINE (BENADRYL) 25 MG tablet Take 25 mg by mouth every 6 (six) hours as needed for allergies.     [provider]  HYDROcodone-acetaminophen (NORCO/VICODIN) 5-325 MG tablet Take 1-2 tablets by mouth every 4 (four) hours as needed. Patient not taking: Reported on 08/02/2016 09/16/15   Dorie Rank, MD  ketorolac (TORADOL) 10 MG tablet Take 1 tablet (10 mg total) by mouth every 6 (six) hours as needed. 09/07/16   Barnet Glasgow, NP  lisinopril (PRINIVIL,ZESTRIL) 10 MG tablet Take 10 mg by mouth daily.    [provider]  meloxicam (MOBIC) 7.5 MG tablet Take 7.5 mg by mouth daily. 05/28/15   [provider]  oxybutynin (DITROPAN XL) 10 MG 24 hr tablet Take 1 tablet (10 mg total) by mouth at bedtime. 09/16/15   Dorie Rank, MD  propranolol ER (INDERAL LA) 80 MG 24 hr capsule Take 1 capsule (80 mg total) by mouth daily. 05/16/15   Nicholas Lose, MD  senna (SENOKOT) 8.6 MG tablet Take 1 tablet by mouth daily as needed for constipation.     [provider]  UNABLE TO FIND Rx: L8000-Post Surgical Bra (Quantity: 6) H9622- Silicone Breast Prosthesis (Quantity: 2) Dx: 174.9; Bilateral partial mastectomy Replacement due to hot flashes since previous items were too heavy and hot 01/20/13   Fanny Skates, MD  venlafaxine XR (EFFEXOR-XR) 150 MG 24 hr capsule Take 1 capsule  (150 mg total) by mouth at bedtime. 07/24/16   Nicholas Lose, MD   Meds Ordered and Administered this Visit   Medications  dexamethasone (DECADRON) injection 10 mg (10 mg Intramuscular Given 09/07/16 1110)  ketorolac (TORADOL) 30 MG/ML injection 30 mg (30 mg Intramuscular Given 09/07/16 1108)    BP 131/83 (BP Location: Right Arm)   Pulse 98   Temp 98.2 F (36.8 C) (Oral)   Resp 18   SpO2 96%  No data found.   Physical Exam  Constitutional: She is oriented to person, place, and time. She appears well-developed and well-nourished. No distress.  HENT:  Head: Normocephalic and atraumatic.  Right Ear: External ear normal.  Left Ear: External ear normal.  Eyes: Conjunctivae are normal. Pupils are equal, round, and reactive to light. Right eye exhibits no discharge. Left eye exhibits no discharge.  Neck: Normal range of motion.  Neurological: She is alert and oriented to person, place, and time.  Skin: Skin is warm and dry. Capillary refill takes less than 2 seconds. No rash noted. She is not diaphoretic. No erythema.  Psychiatric: She has a normal mood and affect. Her behavior is normal.  Nursing note and vitals reviewed.   Urgent Care Course     Procedures (including critical care time)  Labs Review Labs Reviewed - No data to display  Imaging Review No results found.     MDM   1. Migraine without aura and without status migrainosus, not intractable    Patient given injection of Decadron, Toradol in clinic. Discharged home with prescription of Toradol. Follow-up with primary care if symptoms persist.    Barnet Glasgow, NP 09/07/16 1133

## 2016-09-10 DIAGNOSIS — Z720 Tobacco use: Secondary | ICD-10-CM | POA: Diagnosis not present

## 2016-09-10 DIAGNOSIS — R51 Headache: Secondary | ICD-10-CM | POA: Diagnosis not present

## 2016-09-10 DIAGNOSIS — R21 Rash and other nonspecific skin eruption: Secondary | ICD-10-CM | POA: Diagnosis not present

## 2016-09-10 DIAGNOSIS — I10 Essential (primary) hypertension: Secondary | ICD-10-CM | POA: Diagnosis not present

## 2016-09-14 ENCOUNTER — Other Ambulatory Visit: Payer: Self-pay | Admitting: Nurse Practitioner

## 2016-09-14 DIAGNOSIS — R21 Rash and other nonspecific skin eruption: Secondary | ICD-10-CM

## 2016-09-15 ENCOUNTER — Emergency Department (HOSPITAL_COMMUNITY): Payer: PPO

## 2016-09-15 ENCOUNTER — Emergency Department (HOSPITAL_COMMUNITY)
Admission: EM | Admit: 2016-09-15 | Discharge: 2016-09-15 | Disposition: A | Discharge status: Home / Self Care | Payer: PPO | Attending: Emergency Medicine | Admitting: Emergency Medicine

## 2016-09-15 ENCOUNTER — Encounter (HOSPITAL_COMMUNITY): Payer: Self-pay

## 2016-09-15 DIAGNOSIS — G43001 Migraine without aura, not intractable, with status migrainosus: Secondary | ICD-10-CM | POA: Diagnosis not present

## 2016-09-15 DIAGNOSIS — R51 Headache: Secondary | ICD-10-CM | POA: Diagnosis not present

## 2016-09-15 DIAGNOSIS — Z7982 Long term (current) use of aspirin: Secondary | ICD-10-CM | POA: Diagnosis not present

## 2016-09-15 DIAGNOSIS — Z87891 Personal history of nicotine dependence: Secondary | ICD-10-CM | POA: Diagnosis not present

## 2016-09-15 DIAGNOSIS — Z79899 Other long term (current) drug therapy: Secondary | ICD-10-CM | POA: Insufficient documentation

## 2016-09-15 MED ORDER — SODIUM CHLORIDE 0.9 % IV BOLUS (SEPSIS)
500.0000 mL | Freq: Once | INTRAVENOUS | Status: AC
  Administered 2016-09-15: 500 mL via INTRAVENOUS

## 2016-09-15 MED ORDER — PROCHLORPERAZINE EDISYLATE 5 MG/ML IJ SOLN
10.0000 mg | Freq: Once | INTRAMUSCULAR | Status: AC
  Administered 2016-09-15: 10 mg via INTRAVENOUS
  Filled 2016-09-15: qty 2

## 2016-09-15 MED ORDER — DIPHENHYDRAMINE HCL 50 MG/ML IJ SOLN
25.0000 mg | Freq: Once | INTRAMUSCULAR | Status: AC
  Administered 2016-09-15: 25 mg via INTRAVENOUS
  Filled 2016-09-15: qty 1

## 2016-09-15 MED ORDER — DEXAMETHASONE SODIUM PHOSPHATE 10 MG/ML IJ SOLN
10.0000 mg | Freq: Once | INTRAMUSCULAR | Status: AC
  Administered 2016-09-15: 10 mg via INTRAVENOUS
  Filled 2016-09-15: qty 1

## 2016-09-15 NOTE — ED Notes (Signed)
Bed: [facility]PT2 Expected date:  Expected time:  Means of arrival:  Comments: 

## 2016-09-15 NOTE — ED Provider Notes (Signed)
[facility] DEPT Provider Note   CSN: [id number] Arrival date & time: 09/15/16  1438     History   Chief Complaint Chief Complaint  Patient presents with  . Migraine    HPI Ashley Lindsey is a 56 y.o. female.   Migraine  This is a recurrent problem. Episode onset: 1 week. The problem occurs constantly. The problem has not changed since onset.Associated symptoms include headaches. Pertinent negatives include no chest pain, no abdominal pain and no shortness of breath. Exacerbated by: light and sound. Nothing relieves the symptoms. Treatments tried: decadron/toradol by UC. exedrine and motrin.    Past Medical History:  Diagnosis Date  . Abdominal cramps   . Arthritis   . Cancer [facility])    s/p radiation- last dose 6/12//has been off Tamoxifen 8/12  . Chronic headaches   . Costochondritis   . Fatigue   . Fibromyalgia 03/06/2013  . Lymphedema    RIGHT ARM-////  STATES USE LEFT ARM FOR BP'S  . Migraines   . Muscle spasms of head and/or neck    following to the breast  . Passed out    4th of july  . S/P radiation therapy 08/19/07 - 10/10/07   Left Breast/5040 cGy/28 fractions with Boost for a toatl dose of 6300 dGy  . S/P radiation therapy 08/14/10 -10/03/10   right breast  . Sleep apnea    STOP BANG SCORE 5    Patient Active Problem List   Diagnosis Date Noted  . Breast cancer of lower-outer quadrant of right female breast ([place]) 12/02/2014  . Influenzal bronchitis 09/22/2014  . Influenza B 09/22/2014  . Bronchospasm 09/21/2014  . Acute bronchitis 09/21/2014  . Hypoxia 09/21/2014  . Dyspnea 09/21/2014  . Leg pain 09/21/2014  . Fibromyalgia 03/06/2013  . Dense breasts 03/06/2013  . Mastalgia 03/06/2013  . Back pain, lumbosacral 03/06/2013  . Lymphedema of arm 03/06/2013  . Atypical chest pain 01/06/2013  . Breast cancer of upper-inner quadrant of left female breast ([place]) 09/08/2012  . Family history of malignant neoplasm of breast 09/08/2012  . S/P radiation  therapy   . History of breast cancer in female 08/13/2011    Past Surgical History:  Procedure Laterality Date  . ABDOMINAL HYSTERECTOMY     with left salpingooophorectomy  . BREAST LUMPECTOMY  2009/2012   LEFT/RIGHT with lymph node dissection  . KNEE ARTHROSCOPY     right  . OOPHORECTOMY  2013    OB History    No data available       Home Medications    Prior to Admission medications   Medication Sig Start Date End Date Taking? Authorizing Provider  Acetaminophen (ARTHRITIS PAIN PO) Take 650 mg by mouth daily as needed (pain).     [provider]  aspirin 81 MG tablet Take 81 mg by mouth daily.    [provider]  aspirin-acetaminophen-caffeine (EXCEDRIN MIGRAINE) [phone] MG per tablet Take 1 tablet by mouth every 6 (six) hours as needed for migraine.     [provider]  Bisacodyl (DULCOLAX PO) Take 1 capsule by mouth daily.    [provider]  cholecalciferol 5000 units TABS Take 1 tablet (5,000 Units total) by mouth 2 (two) times daily. 07/21/15   Nicholas Lose, MD  citalopram (CELEXA) 20 MG tablet Take 20 mg by mouth daily.  05/07/15   [provider]  cyclobenzaprine (FLEXERIL) 10 MG tablet Take 10 mg by mouth 3 (three) times daily as needed for muscle spasms.  [provider]  diphenhydrAMINE (BENADRYL) 25 MG tablet Take 25 mg by mouth every 6 (six) hours as needed for allergies.     [provider]  fish oil-omega-3 fatty acids 1000 MG capsule Take 1 g by mouth daily.     [provider]  HYDROcodone-acetaminophen (NORCO/VICODIN) 5-325 MG tablet Take 1-2 tablets by mouth every 4 (four) hours as needed. Patient not taking: Reported on 08/02/2016 09/16/15   Dorie Rank, MD  ibuprofen (ADVIL,MOTRIN) 800 MG tablet Take 800 mg by mouth every 8 (eight) hours as needed for moderate pain. For pain    [provider]  ketorolac (TORADOL) 10 MG tablet Take 1 tablet (10 mg total) by mouth every 6 (six)  hours as needed. 09/07/16   Barnet Glasgow, NP  lisinopril (PRINIVIL,ZESTRIL) 10 MG tablet Take 10 mg by mouth daily.    [provider]  meloxicam (MOBIC) 7.5 MG tablet Take 7.5 mg by mouth daily. 05/28/15   [provider]  Multiple Vitamins-Minerals (MULTIVITAMIN & MINERAL PO) Take 1 tablet by mouth daily.    [provider]  oxybutynin (DITROPAN XL) 10 MG 24 hr tablet Take 1 tablet (10 mg total) by mouth at bedtime. 09/16/15   Dorie Rank, MD  pregabalin (LYRICA) 75 MG capsule Take 1 capsule (75 mg total) by mouth 2 (two) times daily. 03/14/12   Eston Esters, MD  propranolol ER (INDERAL LA) 80 MG 24 hr capsule Take 1 capsule (80 mg total) by mouth daily. 05/16/15   Nicholas Lose, MD  senna (SENOKOT) 8.6 MG tablet Take 1 tablet by mouth daily as needed for constipation.     [provider]  tamoxifen (NOLVADEX) 20 MG tablet TAKE 1 TABLET (20 MG TOTAL) BY MOUTH DAILY. 07/26/15   Nicholas Lose, MD  UNABLE TO FIND Rx: L8000-Post Surgical Bra (Quantity: 6) T0160- Silicone Breast Prosthesis (Quantity: 2) Dx: 174.9; Bilateral partial mastectomy Replacement due to hot flashes since previous items were too heavy and hot 01/20/13   Fanny Skates, MD  venlafaxine XR (EFFEXOR-XR) 150 MG 24 hr capsule Take 1 capsule (150 mg total) by mouth at bedtime. 07/24/16   Nicholas Lose, MD    Family History Family History  Problem Relation Age of Onset  . Breast cancer Mother 92  . Heart disease Father   . Prostate cancer Maternal Uncle 78  . Cancer Maternal Aunt 63       d. from "female cancer" possibly cervical  . Prostate cancer Paternal Uncle   . Prostate cancer Maternal Grandfather 12  . Prostate cancer Maternal Uncle 80  . Prostate cancer Paternal Uncle   . Breast cancer Cousin 82       mat 1st cousin    Social History Social History  Substance Use Topics  . Smoking status: Former Smoker    Packs/day: 0.25    Years: 1.00    Types: Cigarettes  . Smokeless  tobacco: Never Used  . Alcohol use No     Comment: 3 x a week     Allergies   Aspirin and Penicillins   Review of Systems Review of Systems  Respiratory: Negative for shortness of breath.   Cardiovascular: Negative for chest pain.  Gastrointestinal: Negative for abdominal pain.  Neurological: Positive for headaches.  All other systems are reviewed and are negative for acute change except as noted in the HPI    Physical Exam Updated Vital Signs BP 130/79 (BP Location: Left Arm)   Pulse 80   Temp  98.4 F (36.9 C) (Oral)   Resp 18   SpO2 99%   Physical Exam  Constitutional: She is oriented to person, place, and time. She appears well-developed and well-nourished. No distress.  HENT:  Head: Normocephalic and atraumatic.  Nose: Nose normal.  Eyes: Conjunctivae and EOM are normal. Pupils are equal, round, and reactive to light. Right eye exhibits no discharge. Left eye exhibits no discharge. No scleral icterus.  Fundoscopic exam:      The right eye shows no papilledema.       The left eye shows no papilledema.  Neck: Normal range of motion. Neck supple.  Cardiovascular: Normal rate and regular rhythm.  Exam reveals no gallop and no friction rub.   No murmur heard. Pulmonary/Chest: Effort normal and breath sounds normal. No stridor. No respiratory distress. She has no rales.  Abdominal: Soft. She exhibits no distension. There is no tenderness.  Musculoskeletal: She exhibits no edema or tenderness.  Neurological: She is alert and oriented to person, place, and time.  Mental Status: Alert and oriented to person, place, and time. Attention and concentration normal. Speech clear. Recent memory is intac  Cranial Nerves  II Visual Fields: Intact to confrontation. Visual fields intact. III, IV, VI: Pupils equal and reactive to light and near. Full eye movement without nystagmus  V Facial Sensation: Normal. No weakness of masticatory muscles  VII: No facial weakness or asymmetry    VIII Auditory Acuity: Grossly normal  IX/X: The uvula is midline; the palate elevates symmetrically  XI: Normal sternocleidomastoid and trapezius strength  XII: The tongue is midline. No atrophy or fasciculations.   Motor System: Muscle Strength: 5/5 and symmetric in the upper and lower extremities. No pronation or drift.  Muscle Tone: Tone and muscle bulk are normal in the upper and lower extremities.   Reflexes: DTRs: 2+ and symmetrical in all four extremities. Plantar responses are flexor bilaterally.  Coordination: Intact finger-to-nose, heel-to-shin, and rapid alternating movements. No tremor.  Sensation: Intact to light touch, and pinprick. Negative Romberg test.  Gait: Routine gait normal    Skin: Skin is warm and dry. No rash noted. She is not diaphoretic. No erythema.  Psychiatric: She has a normal mood and affect.  Vitals reviewed.    ED Treatments / Results  Labs (all labs ordered are listed, but only abnormal results are displayed) Labs Reviewed - No data to display  EKG  EKG Interpretation None       Radiology Ct Head Wo Contrast  Result Date: 09/15/2016 CLINICAL DATA:  56 year old female with history of typical migraine headache for the past week. Additional history of breast cancer. EXAM: CT HEAD WITHOUT CONTRAST TECHNIQUE: Contiguous axial images were obtained from the base of the skull through the vertex without intravenous contrast. COMPARISON:  No priors. FINDINGS: Brain: No evidence of acute infarction, hemorrhage, hydrocephalus, extra-axial collection or mass lesion/mass effect. Vascular: No hyperdense vessel or unexpected calcification. Skull: Normal. Negative for fracture or focal lesion. Sinuses/Orbits: No acute finding. Other: None. IMPRESSION: 1. No acute intracranial abnormalities. The appearance of the brain is normal. Electronically Signed   By: Vinnie Langton M.D.   On: 09/15/2016 17:11    Procedures Procedures (including critical care  time)  Medications Ordered in ED Medications  dexamethasone (DECADRON) injection 10 mg (10 mg Intravenous Given 09/15/16 1642)  prochlorperazine (COMPAZINE) injection 10 mg (10 mg Intravenous Given 09/15/16 1642)  diphenhydrAMINE (BENADRYL) injection 25 mg (25 mg Intravenous Given 09/15/16 1642)  sodium chloride 0.9 % bolus  500 mL (0 mLs Intravenous Stopped 09/15/16 1734)     Initial Impression / Assessment and Plan / ED Course  I have reviewed the triage vital signs and the nursing notes.  Pertinent labs & imaging results that were available during my care of the patient were reviewed by me and considered in my medical decision making (see chart for details).     Non focal neuro exam. No recent head trauma. No fever. Doubt meningitis. Doubt intracranial bleed. Doubt IIH.   Given the patient's history of prior breast cancer with change in her typical migraine headaches over this past year and no recent brain imaging, we will obtain a CT head.  CT head negative for any obvious metastatic disease.  Will treat with migraine cocktail and reevaluate.  5:37 PM Significant improvement in patient's headache.  The patient is safe for discharge with strict return precautions.    Final Clinical Impressions(s) / ED Diagnoses   Final diagnoses:  Migraine without aura and with status migrainosus, not intractable   Disposition: Discharge  Condition: Good  I have discussed the results, Dx and Tx plan with the patient who expressed understanding and agree(s) with the plan. Discharge instructions discussed at great length. The patient was given strict return precautions who verbalized understanding of the instructions. No further questions at time of discharge.    New Prescriptions   No medications on file    Follow Up: [name], [address] [place] [place] [address] [phone]  Schedule an appointment as soon as possible for a visit  As  needed  Neurologist  Schedule an appointment as soon as possible for a visit        Jason Frisbee, Grayce Sessions, MD 09/15/16 [phone]

## 2016-09-15 NOTE — ED Notes (Signed)
Patient transported to CT 

## 2016-09-15 NOTE — ED Triage Notes (Signed)
She c/o typical migraine x 1 week. Saw her pcp this week who prescribed propranolol. She is alert and oriented x 4 with clear speech. She denies fever, nor any other sign of recent or current illness.

## 2016-09-20 ENCOUNTER — Telehealth: Payer: Self-pay | Admitting: *Deleted

## 2016-09-20 ENCOUNTER — Other Ambulatory Visit: Payer: Self-pay

## 2016-09-20 DIAGNOSIS — C50511 Malignant neoplasm of lower-outer quadrant of right female breast: Secondary | ICD-10-CM

## 2016-09-20 DIAGNOSIS — Z853 Personal history of malignant neoplasm of breast: Secondary | ICD-10-CM

## 2016-09-20 DIAGNOSIS — C50212 Malignant neoplasm of upper-inner quadrant of left female breast: Secondary | ICD-10-CM

## 2016-09-20 NOTE — Telephone Encounter (Signed)
"  [facility] [address] I need to have a written order from Dr. Lindi Adie to have an MRI of my breast.  Never needed this before.  I cried it hurt so bad for mammograms due to dense breast.  I also may have missed an appointment.  I would like my annual imaging before I see him."   Patient aware provider out of office but will be messaged with this request.  Last seen on 07-21-2015.

## 2016-09-20 NOTE — Telephone Encounter (Signed)
Placed order for MRI breast, which she's had done before.

## 2016-09-21 ENCOUNTER — Other Ambulatory Visit: Payer: Self-pay | Admitting: Hematology and Oncology

## 2016-09-21 ENCOUNTER — Other Ambulatory Visit: Payer: Self-pay

## 2016-09-27 ENCOUNTER — Other Ambulatory Visit: Payer: PPO

## 2016-10-01 ENCOUNTER — Other Ambulatory Visit: Payer: Self-pay | Admitting: Hematology and Oncology

## 2016-10-02 ENCOUNTER — Other Ambulatory Visit: Payer: PPO

## 2016-10-03 ENCOUNTER — Other Ambulatory Visit: Payer: Self-pay | Admitting: Hematology and Oncology

## 2016-10-03 DIAGNOSIS — Z1231 Encounter for screening mammogram for malignant neoplasm of breast: Secondary | ICD-10-CM

## 2016-10-09 ENCOUNTER — Other Ambulatory Visit: Payer: Self-pay | Admitting: Hematology and Oncology

## 2016-10-10 ENCOUNTER — Other Ambulatory Visit: Payer: Self-pay | Admitting: Emergency Medicine

## 2016-10-10 MED ORDER — TAMOXIFEN CITRATE 20 MG PO TABS: ORAL_TABLET | ORAL | 3 refills | Status: DC

## 2016-10-16 ENCOUNTER — Ambulatory Visit
Admission: RE | Admit: 2016-10-16 | Discharge: 2016-10-16 | Disposition: A | Discharge status: Home / Self Care | Payer: PPO | Source: Ambulatory Visit | Attending: Hematology and Oncology | Admitting: Hematology and Oncology

## 2016-10-16 ENCOUNTER — Other Ambulatory Visit: Payer: Self-pay | Admitting: Hematology and Oncology

## 2016-10-16 DIAGNOSIS — Z9889 Other specified postprocedural states: Secondary | ICD-10-CM

## 2016-10-16 DIAGNOSIS — R922 Inconclusive mammogram: Secondary | ICD-10-CM | POA: Diagnosis not present

## 2016-10-16 DIAGNOSIS — Z1231 Encounter for screening mammogram for malignant neoplasm of breast: Secondary | ICD-10-CM

## 2016-10-16 HISTORY — DX: Personal history of irradiation: Z92.3

## 2016-10-25 DIAGNOSIS — H2513 Age-related nuclear cataract, bilateral: Secondary | ICD-10-CM | POA: Diagnosis not present

## 2016-10-25 DIAGNOSIS — H11153 Pinguecula, bilateral: Secondary | ICD-10-CM | POA: Diagnosis not present

## 2016-10-25 DIAGNOSIS — H11423 Conjunctival edema, bilateral: Secondary | ICD-10-CM | POA: Diagnosis not present

## 2016-10-25 DIAGNOSIS — H40013 Open angle with borderline findings, low risk, bilateral: Secondary | ICD-10-CM | POA: Diagnosis not present

## 2016-10-25 DIAGNOSIS — H40009 Preglaucoma, unspecified, unspecified eye: Secondary | ICD-10-CM | POA: Diagnosis not present

## 2016-10-25 DIAGNOSIS — H3589 Other specified retinal disorders: Secondary | ICD-10-CM | POA: Diagnosis not present

## 2016-10-25 DIAGNOSIS — H25013 Cortical age-related cataract, bilateral: Secondary | ICD-10-CM | POA: Diagnosis not present

## 2016-10-25 DIAGNOSIS — H5213 Myopia, bilateral: Secondary | ICD-10-CM | POA: Diagnosis not present

## 2016-10-25 DIAGNOSIS — H52223 Regular astigmatism, bilateral: Secondary | ICD-10-CM | POA: Diagnosis not present

## 2016-10-25 DIAGNOSIS — H18413 Arcus senilis, bilateral: Secondary | ICD-10-CM | POA: Diagnosis not present

## 2016-12-19 ENCOUNTER — Other Ambulatory Visit: Payer: Self-pay | Admitting: Hematology and Oncology

## 2016-12-29 ENCOUNTER — Ambulatory Visit (HOSPITAL_COMMUNITY)
Admission: EM | Admit: 2016-12-29 | Discharge: 2016-12-29 | Disposition: A | Discharge status: Home / Self Care | Payer: PPO | Attending: Family Medicine | Admitting: Family Medicine

## 2016-12-29 ENCOUNTER — Encounter (HOSPITAL_COMMUNITY): Payer: Self-pay | Admitting: Emergency Medicine

## 2016-12-29 DIAGNOSIS — L309 Dermatitis, unspecified: Secondary | ICD-10-CM

## 2016-12-29 MED ORDER — TRIAMCINOLONE ACETONIDE 0.1 % EX CREA: 1.0000 "application " | TOPICAL_CREAM | Freq: Two times a day (BID) | CUTANEOUS | 0 refills | Status: DC

## 2016-12-29 NOTE — ED Triage Notes (Signed)
Pt c/o insect bites she noticed on Tuesday morning. Used otc hydrocortisone cream and home remedies and benadryl to stop the itching with no relief. Now has 4 or 5 hard spots and is concerned they are not going away.

## 2016-12-29 NOTE — ED Provider Notes (Signed)
[facility]   [id number] 12/29/16 Arrival Time: 2440  ASSESSMENT & PLAN:  1. Dermatitis     Meds ordered this encounter  Medications  . triamcinolone cream (KENALOG) 0.1 %    Sig: Apply 1 application topically 2 (two) times daily. Apply for up to one week.    Dispense:  30 g    Refill:  0   Apply steroid cream as directed for up to one week. Will f/u with PCP if not improving. Reviewed expectations re: course of current medical issues. Questions answered. Outlined signs and symptoms indicating need for more acute intervention. Patient verbalized understanding. After Visit Summary given.   SUBJECTIVE:  Ashley Lindsey is a 56 y.o. female who presents with complaint of itchy rash for 3-4 days on RLE. OTC hydrocortisone cream and Benadryl without relief. "Itching driving me crazy." Questions bite to area. Unsure. Afebrile. Areas of skin irritation not spreading nor changing in size over the past couple of days. No h/o similar. No new exposures reported.  ROS: As per HPI.   OBJECTIVE:  Vitals:   12/29/16 1251  BP: (!) 137/91  Pulse: 90  Resp: 18  Temp: 98.7 F (37.1 C)  TempSrc: Oral  SpO2: 100%    General appearance: alert; no distress Skin: R lateral thigh and R lateral calf with area of skin inflammation approx 1-2cm in diameter; irregular borders; raised; there is so much residual calamine lotion on skin it is difficult to fully see skin and very difficult to wipe off; no areas of fluctuance; non-tender Psychological: alert and cooperative; normal mood and affect   Allergies  Allergen Reactions  . Aspirin     unknown  . Penicillins Hives and Swelling    Has patient had a PCN reaction causing immediate rash, facial/tongue/throat swelling, SOB or lightheadedness with hypotension: Yes Has patient had a PCN reaction causing severe rash involving mucus membranes or skin necrosis: Yes Has patient had a PCN reaction that required hospitalization Yes Has  patient had a PCN reaction occurring within the last 10 years: No If all of the above answers are "NO", then may proceed with Cephalosporin use.     Past Medical History:  Diagnosis Date  . Abdominal cramps   . Arthritis   . Cancer [facility])    s/p radiation- last dose 6/12//has been off Tamoxifen 8/12  . Chronic headaches   . Costochondritis   . Fatigue   . Fibromyalgia 03/06/2013  . Lymphedema    RIGHT ARM-////  STATES USE LEFT ARM FOR BP'S  . Migraines   . Muscle spasms of head and/or neck    following to the breast  . Passed out    4th of july  . Personal history of radiation therapy 2010   lt breast   . S/P radiation therapy 08/19/07 - 10/10/07   Left Breast/5040 cGy/28 fractions with Boost for a toatl dose of 6300 dGy  . S/P radiation therapy 08/14/10 -10/03/10   right breast  . Sleep apnea    STOP BANG SCORE 5   Social History   Social History  . Marital status: Divorced    Spouse name: N/A  . Number of children: N/A  . Years of education: N/A   Occupational History  . Not on file.   Social History Main Topics  . Smoking status: Former Smoker    Packs/day: 0.25    Years: 1.00    Types: Cigarettes  . Smokeless tobacco: Never Used  . Alcohol use No  Comment: 3 x a week  . Drug use: No  . Sexual activity: No   Other Topics Concern  . Not on file   Social History Narrative  . No narrative on file   Family History  Problem Relation Age of Onset  . Breast cancer Mother 80  . Heart disease Father   . Prostate cancer Maternal Uncle 78  . Cancer Maternal Aunt 63       d. from "female cancer" possibly cervical  . Prostate cancer Paternal Uncle   . Prostate cancer Maternal Grandfather 5  . Prostate cancer Maternal Uncle 80  . Prostate cancer Paternal Uncle   . Breast cancer Cousin 63       mat 1st cousin   Past Surgical History:  Procedure Laterality Date  . ABDOMINAL HYSTERECTOMY     with left salpingooophorectomy  . BREAST LUMPECTOMY  2009/2012     LEFT/RIGHT with lymph node dissection  . KNEE ARTHROSCOPY     right  . OOPHORECTOMY  2013     Vanessa Kick, MD 12/29/16 [phone]

## 2017-01-04 ENCOUNTER — Other Ambulatory Visit: Payer: Self-pay

## 2017-01-04 DIAGNOSIS — C50511 Malignant neoplasm of lower-outer quadrant of right female breast: Secondary | ICD-10-CM

## 2017-01-04 DIAGNOSIS — C50212 Malignant neoplasm of upper-inner quadrant of left female breast: Secondary | ICD-10-CM

## 2017-01-07 ENCOUNTER — Other Ambulatory Visit (HOSPITAL_BASED_OUTPATIENT_CLINIC_OR_DEPARTMENT_OTHER): Payer: PPO

## 2017-01-07 ENCOUNTER — Ambulatory Visit (HOSPITAL_BASED_OUTPATIENT_CLINIC_OR_DEPARTMENT_OTHER): Payer: PPO | Admitting: Hematology and Oncology

## 2017-01-07 ENCOUNTER — Telehealth: Payer: Self-pay | Admitting: Hematology and Oncology

## 2017-01-07 DIAGNOSIS — Z170 Estrogen receptor positive status [ER+]: Secondary | ICD-10-CM | POA: Diagnosis not present

## 2017-01-07 DIAGNOSIS — C50212 Malignant neoplasm of upper-inner quadrant of left female breast: Secondary | ICD-10-CM

## 2017-01-07 DIAGNOSIS — Z79810 Long term (current) use of selective estrogen receptor modulators (SERMs): Secondary | ICD-10-CM | POA: Diagnosis not present

## 2017-01-07 DIAGNOSIS — C50511 Malignant neoplasm of lower-outer quadrant of right female breast: Secondary | ICD-10-CM

## 2017-01-07 LAB — CBC WITH DIFFERENTIAL/PLATELET
BASO%: 0.2 % (ref 0.0–2.0)
Basophils Absolute: 0 10*3/uL (ref 0.0–0.1)
EOS%: 4.9 % (ref 0.0–7.0)
Eosinophils Absolute: 0.3 10*3/uL (ref 0.0–0.5)
HCT: 37.6 % (ref 34.8–46.6)
HGB: 11.5 g/dL — ABNORMAL LOW (ref 11.6–15.9)
LYMPH%: 33.3 % (ref 14.0–49.7)
MCH: 26.2 pg (ref 25.1–34.0)
MCHC: 30.6 g/dL — ABNORMAL LOW (ref 31.5–36.0)
MCV: 85.6 fL (ref 79.5–101.0)
MONO#: 0.6 10*3/uL (ref 0.1–0.9)
MONO%: 9.2 % (ref 0.0–14.0)
NEUT#: 3.1 10*3/uL (ref 1.5–6.5)
NEUT%: 52.4 % (ref 38.4–76.8)
Platelets: 274 10*3/uL (ref 145–400)
RBC: 4.39 10*6/uL (ref 3.70–5.45)
RDW: 14.4 % (ref 11.2–14.5)
WBC: 6 10*3/uL (ref 3.9–10.3)
lymph#: 2 10*3/uL (ref 0.9–3.3)

## 2017-01-07 LAB — COMPREHENSIVE METABOLIC PANEL
ALT: 54 U/L (ref 0–55)
AST: 47 U/L — ABNORMAL HIGH (ref 5–34)
Albumin: 3.5 g/dL (ref 3.5–5.0)
Alkaline Phosphatase: 73 U/L (ref 40–150)
Anion Gap: 8 mEq/L (ref 3–11)
BUN: 8.7 mg/dL (ref 7.0–26.0)
CO2: 25 mEq/L (ref 22–29)
Calcium: 8.9 mg/dL (ref 8.4–10.4)
Chloride: 106 mEq/L (ref 98–109)
Creatinine: 0.8 mg/dL (ref 0.6–1.1)
EGFR: >90 mL/min/{1.73_m2} (ref >90)
Glucose: 111 mg/dl (ref 70–140)
Potassium: 4.1 mEq/L (ref 3.5–5.1)
Sodium: 139 mEq/L (ref 136–145)
Total Bilirubin: 0.22 mg/dL (ref 0.20–1.20)
Total Protein: 7 g/dL (ref 6.4–8.3)

## 2017-01-07 MED ORDER — VENLAFAXINE HCL ER 75 MG PO CP24: ORAL_CAPSULE | ORAL | 3 refills | Status: DC

## 2017-01-07 NOTE — Telephone Encounter (Signed)
Spoke with patient regarding her appts. Did not want avs or calendar.

## 2017-01-07 NOTE — Progress Notes (Signed)
Patient Care Team: [name], MD as PCP - General (Internal Medicine)  DIAGNOSIS:  Encounter Diagnosis  Name Primary?  . Malignant neoplasm of upper-inner quadrant of left breast in female, estrogen receptor positive ([name])     SUMMARY OF ONCOLOGIC HISTORY:   Breast cancer of upper-inner quadrant of left female breast ([name])   05/27/2007 Initial Diagnosis    Left breast biopsy: DCIS with calcifications ER/PR 100% positive, MRI revealed 1.7 x 1.4 x 0.9 cm DCIS at 12:00 position left breast      06/20/2007 Surgery    Left breast lumpectomy: T1 N0 M0 stage IA invasive ductal carcinoma 0.4-0.5 cm grade 1, ER 77%, PR 96%, Ki-67 less than 5%, HER-2 negative      08/21/2007 - 10/10/2007 Radiation Therapy    Adjuvant radiation therapy      01/14/2008 -  Anti-estrogen oral therapy    Tamoxifen 20 mg daily       Breast cancer of lower-outer quadrant of right female breast ([name])   05/26/2010 Initial Diagnosis    DCIS ER/PR positive      06/16/2010 Surgery    No residual DCIS, extensive fibrocystic changes with focal atypical ductal hyperplasia      08/02/2010 - 09/22/2010 Radiation Therapy    Adjuvant radiation      10/26/2010 -  Anti-estrogen oral therapy    Tamoxifen 20 mg daily       CHIEF COMPLIANT: Follow-up on tamoxifen therapy  INTERVAL HISTORY: Ashley Lindsey is a 56 year old lady with above-mentioned history of DCIS involving the right breast one surgery followed by radiation. She is currently on tamoxifen therapy. She continues to have problems with fibromyalgia with a lot of pains underneath the breast. She denies any lumps or nodules in the breast. She does feel some fullness in the breasts intermittently due to dense breasts. She was taking Effexor for depression. However it appears that she was taking too much Effexor almost 450 mg a day well be prescribed only 150 mg a day. She ran out of Effexor has not been on it for several weeks. She reportedly increase the doses  because Effexor makes her feel better and not depressed or sad anymore. Patient's daughter is a vegan and is trying to make her eat better.  REVIEW OF SYSTEMS:   Constitutional: Denies fevers, chills or abnormal weight loss Eyes: Denies blurriness of vision Ears, nose, mouth, throat, and face: Denies mucositis or sore throat Respiratory: Denies cough, dyspnea or wheezes Cardiovascular: Denies palpitation, chest discomfort Gastrointestinal:  Denies nausea, heartburn or change in bowel habits Skin: Denies abnormal skin rashes Lymphatics: Denies new lymphadenopathy or easy bruising Neurological:Denies numbness, tingling or new weaknesses Behavioral/Psych: Mood is stable, no new changes  Extremities: No lower extremity edema Breast: Tenderness in bilateral breasts which is chronic All other systems were reviewed with the patient and are negative.  I have reviewed the past medical history, past surgical history, social history and family history with the patient and they are unchanged from previous note.  ALLERGIES:  is allergic to aspirin and penicillins.  MEDICATIONS:  Current Outpatient Prescriptions  Medication Sig Dispense Refill  . Bisacodyl (DULCOLAX PO) Take 1 capsule by mouth daily.    . cholecalciferol 5000 units TABS Take 1 tablet (5,000 Units total) by mouth 2 (two) times daily.    . cyclobenzaprine (FLEXERIL) 10 MG tablet Take 10 mg by mouth 3 (three) times daily as needed for muscle spasms.     . diphenhydrAMINE (BENADRYL) 25 MG  tablet Take 25 mg by mouth every 6 (six) hours as needed for allergies.     . fish oil-omega-3 fatty acids 1000 MG capsule Take 1 g by mouth daily.     Marland Kitchen ibuprofen (ADVIL,MOTRIN) 800 MG tablet Take 800 mg by mouth every 8 (eight) hours as needed for moderate pain. For pain    . lisinopril (PRINIVIL,ZESTRIL) 10 MG tablet Take 10 mg by mouth daily.    . Multiple Vitamins-Minerals (MULTIVITAMIN & MINERAL PO) Take 1 tablet by mouth daily.    . pregabalin  (LYRICA) 75 MG capsule Take 1 capsule (75 mg total) by mouth 2 (two) times daily. 60 capsule 1  . propranolol (INDERAL) 20 MG tablet Take 20 mg by mouth daily.  3  . propranolol ER (INDERAL LA) 80 MG 24 hr capsule Take 1 capsule (80 mg total) by mouth daily.    Marland Kitchen senna (SENOKOT) 8.6 MG tablet Take 1 tablet by mouth daily as needed for constipation.     . tamoxifen (NOLVADEX) 20 MG tablet TAKE 1 TABLET (20 MG TOTAL) BY MOUTH DAILY. 90 tablet 3  . triamcinolone cream (KENALOG) 0.1 % Apply 1 application topically 2 (two) times daily. Apply for up to one week. 30 g 0  . UNABLE TO FIND Rx: L8000-Post Surgical Bra (Quantity: 6) G2542- Silicone Breast Prosthesis (Quantity: 2) Dx: 174.9; Bilateral partial mastectomy Replacement due to hot flashes since previous items were too heavy and hot 1 each 0  . venlafaxine XR (EFFEXOR-XR) 150 MG 24 hr capsule TAKE 1 CAPSULE (150 MG TOTAL) BY MOUTH AT BEDTIME. 90 capsule 0  . venlafaxine XR (EFFEXOR-XR) 75 MG 24 hr capsule Once daily along with 150 mg capsule to make it 225 mg total 90 capsule 3   No current facility-administered medications for this visit.     PHYSICAL EXAMINATION: ECOG PERFORMANCE STATUS: 2 - Symptomatic, <50% confined to bed  Vitals:   01/07/17 0943  BP: (!) 149/79  Pulse: 87  Resp: 20  Temp: 98.6 F (37 C)  SpO2: 100%   Filed Weights   01/07/17 0943  Weight: 277 lb 4.8 oz (125.8 kg)    GENERAL:alert, no distress and comfortable SKIN: skin color, texture, turgor are normal, no rashes or significant lesions EYES: normal, Conjunctiva are pink and non-injected, sclera clear OROPHARYNX:no exudate, no erythema and lips, buccal mucosa, and tongue normal  NECK: supple, thyroid normal size, non-tender, without nodularity LYMPH:  no palpable lymphadenopathy in the cervical, axillary or inguinal LUNGS: clear to auscultation and percussion with normal breathing effort HEART: regular rate & rhythm and no murmurs and no lower extremity  edema ABDOMEN:abdomen soft, non-tender and normal bowel sounds MUSCULOSKELETAL:no cyanosis of digits and no clubbing  NEURO: alert & oriented x 3 with fluent speech, no focal motor/sensory deficits EXTREMITIES: No lower extremity edema BREAST: Palpable and dense breasts bilaterally. No palpable axillary supraclavicular or infraclavicular adenopathy no breast tenderness or nipple discharge. (exam performed in the presence of a chaperone)  LABORATORY DATA:  I have reviewed the data as listed   Chemistry      Component Value Date/Time   NA 139 01/07/2017 0920   K 4.1 01/07/2017 0920   CL 107 09/16/2015 2159   CL 105 12/26/2011 1538   CO2 25 01/07/2017 0920   BUN 8.7 01/07/2017 0920   CREATININE 0.8 01/07/2017 0920      Component Value Date/Time   CALCIUM 8.9 01/07/2017 0920   ALKPHOS 73 01/07/2017 0920   AST 47 (H)  01/07/2017 0920   ALT 54 01/07/2017 0920   BILITOT 0.22 01/07/2017 0920       Lab Results  Component Value Date   WBC 6.0 01/07/2017   HGB 11.5 (L) 01/07/2017   HCT 37.6 01/07/2017   MCV 85.6 01/07/2017   PLT 274 01/07/2017   NEUTROABS 3.1 01/07/2017    ASSESSMENT & PLAN:  Breast cancer of upper-inner quadrant of left female breast ([place]) Left breast invasive ductal carcinoma grade 1 ER 77% PR 96% gases on 5% HER-2 negative, T1 AN0M0 stage I A. status post lumpectomy 2.7 2009 followed by radiation therapy completed 10/10/2007, tamoxifen started September 2009 discontinued March 2012 followed by recurrence in the right breast as DCIS 05/26/2010 completed another lumpectomy and radiation therapy and she has been on tamoxifen ever since.  Tamoxifen counseling: Patient is tolerating it very well without any major problems. she will complete 5 yrs of tamoxifen therapy in July 2017. We sent for breast cancer index and there was insufficient tissue. 18 2009 patient had invasive ductal carcinoma and in 2012 she had DCIS. I recommended that she can come off tamoxifen at  this time since there is no data for benefit from tamoxifen and DCIS after more than 5 years Patient is very excited to be coming off tamoxifen.  Breast Cancer Surveillance: 1. Breast exam 01/07/2017: Normal, fibromyalgia causing breast tenderness. There are no nodules or lumps to warrant any additional investigation at this time. 2. Mammogram 10/16/2016 No abnormalities. Postsurgical changes. Breast Density Category C.   Return to clinic in 1 year for follow-up   I spent 25 minutes talking to the patient of which more than half was spent in counseling and coordination of care.  No orders of the defined types were placed in this encounter.  The patient has a good understanding of the overall plan. she agrees with it. she will call with any problems that may develop before the next visit here.   Rulon Eisenmenger, MD 01/07/17

## 2017-01-07 NOTE — Assessment & Plan Note (Signed)
Left breast invasive ductal carcinoma grade 1 ER 77% PR 96% gases on 5% HER-2 negative, T1 AN0M0 stage I A. status post lumpectomy 2.7 2009 followed by radiation therapy completed 10/10/2007, tamoxifen started September 2009 discontinued March 2012 followed by recurrence in the right breast as DCIS 05/26/2010 completed another lumpectomy and radiation therapy and she has been on tamoxifen ever since.  Tamoxifen counseling: Patient is tolerating it very well without any major problems. she will complete 5 yrs of tamoxifen therapy in July 2017. We sent for breast cancer index and there was insufficient tissue. Patient decided to continue it for 5 more years.  Breast Cancer Surveillance: 1. Breast exam 01/07/2017: Normal, fibromyalgia causing breast tenderness. There are no nodules or lumps to warrant any additional investigation at this time. 2. Mammogram 10/16/2016 No abnormalities. Postsurgical changes. Breast Density Category C.   Return to clinic in 1 year for follow-up

## 2017-03-25 ENCOUNTER — Other Ambulatory Visit: Payer: Self-pay | Admitting: Hematology and Oncology

## 2017-04-15 DIAGNOSIS — R7303 Prediabetes: Secondary | ICD-10-CM | POA: Diagnosis not present

## 2017-04-15 DIAGNOSIS — M797 Fibromyalgia: Secondary | ICD-10-CM | POA: Diagnosis not present

## 2017-04-15 DIAGNOSIS — R05 Cough: Secondary | ICD-10-CM | POA: Diagnosis not present

## 2017-04-15 DIAGNOSIS — E782 Mixed hyperlipidemia: Secondary | ICD-10-CM | POA: Diagnosis not present

## 2017-04-15 DIAGNOSIS — I10 Essential (primary) hypertension: Secondary | ICD-10-CM | POA: Diagnosis not present

## 2017-05-07 ENCOUNTER — Encounter (HOSPITAL_COMMUNITY): Payer: Self-pay | Admitting: Emergency Medicine

## 2017-05-07 ENCOUNTER — Emergency Department (HOSPITAL_COMMUNITY)
Admission: EM | Admit: 2017-05-07 | Discharge: 2017-05-07 | Disposition: A | Discharge status: Home / Self Care | Payer: PPO | Attending: Emergency Medicine | Admitting: Emergency Medicine

## 2017-05-07 ENCOUNTER — Emergency Department (HOSPITAL_COMMUNITY): Payer: PPO

## 2017-05-07 ENCOUNTER — Other Ambulatory Visit: Payer: Self-pay

## 2017-05-07 DIAGNOSIS — Z853 Personal history of malignant neoplasm of breast: Secondary | ICD-10-CM | POA: Diagnosis not present

## 2017-05-07 DIAGNOSIS — J069 Acute upper respiratory infection, unspecified: Secondary | ICD-10-CM | POA: Diagnosis not present

## 2017-05-07 DIAGNOSIS — B9789 Other viral agents as the cause of diseases classified elsewhere: Secondary | ICD-10-CM

## 2017-05-07 DIAGNOSIS — R05 Cough: Secondary | ICD-10-CM | POA: Diagnosis not present

## 2017-05-07 DIAGNOSIS — R0789 Other chest pain: Secondary | ICD-10-CM | POA: Diagnosis not present

## 2017-05-07 DIAGNOSIS — Z87891 Personal history of nicotine dependence: Secondary | ICD-10-CM | POA: Insufficient documentation

## 2017-05-07 DIAGNOSIS — Z79899 Other long term (current) drug therapy: Secondary | ICD-10-CM | POA: Insufficient documentation

## 2017-05-07 DIAGNOSIS — R062 Wheezing: Secondary | ICD-10-CM | POA: Diagnosis not present

## 2017-05-07 MED ORDER — HYDROCOD POLST-CPM POLST ER 10-8 MG/5ML PO SUER: 5.0000 mL | Freq: Two times a day (BID) | ORAL | 0 refills | Status: DC | PRN

## 2017-05-07 NOTE — ED Notes (Signed)
Patient transported to X-ray 

## 2017-05-07 NOTE — Discharge Instructions (Signed)
Take Tussionex every 12 hours as needed for cough.  Do not combine with Mucinex or other over-the-counter cough medications.  You can take Tussionex at night to help stop coughing and Mucinex during the day.  Please follow-up with your doctor if your symptoms are not improving.  Please return to the emergency department if you develop any new or worsening symptoms including severe chest pain, shortness of breath, persistent fever over 100.4, or any other new or concerning symptoms.

## 2017-05-07 NOTE — ED Triage Notes (Addendum)
Pt reports slightly productive cough, stuffy nose x5 days with clear sputum, has been taking otc meds with no relief. Denies fevers. Reports chest tightness when coughing.

## 2017-05-07 NOTE — ED Provider Notes (Signed)
[facility] EMERGENCY DEPARTMENT Provider Note   CSN: [id number] Arrival date & time: 05/07/17  1012     History   Chief Complaint Chief Complaint  Patient presents with  . Cough    HPI Ashley Lindsey is a 57 y.o. female with history of breast cancer in remission, fibromyalgia who presents with a 5-day history of cough, nasal congestion, chest tightness, wheezing at night.  She denies any fevers.  She has had chest soreness from coughing, but no chest pain or shortness of breath.  She has been taking Mucinex sinus, and several other over-the-counter medications with some relief.  She denies any abdominal pain, nausea, vomiting.  She has been around several sick children at her house.  HPI  Past Medical History:  Diagnosis Date  . Abdominal cramps   . Arthritis   . Cancer [facility]    s/p radiation- last dose 6/12//has been off Tamoxifen 8/12  . Chronic headaches   . Costochondritis   . Fatigue   . Fibromyalgia 03/06/2013  . Lymphedema    RIGHT ARM-////  STATES USE LEFT ARM FOR BP'S  . Migraines   . Muscle spasms of head and/or neck    following to the breast  . Passed out [facility])    4th of july  . Personal history of radiation therapy 2010   lt breast   . S/P radiation therapy 08/19/07 - 10/10/07   Left Breast/5040 cGy/28 fractions with Boost for a toatl dose of 6300 dGy  . S/P radiation therapy 08/14/10 -10/03/10   right breast  . Sleep apnea    STOP BANG SCORE 5    Patient Active Problem List   Diagnosis Date Noted  . Breast cancer of lower-outer quadrant of right female breast ([place]) 12/02/2014  . Influenzal bronchitis 09/22/2014  . Influenza B 09/22/2014  . Bronchospasm 09/21/2014  . Acute bronchitis 09/21/2014  . Hypoxia 09/21/2014  . Dyspnea 09/21/2014  . Leg pain 09/21/2014  . Fibromyalgia 03/06/2013  . Dense breasts 03/06/2013  . Mastalgia 03/06/2013  . Back pain, lumbosacral 03/06/2013  . Lymphedema of arm 03/06/2013  . Atypical chest  pain 01/06/2013  . Breast cancer of upper-inner quadrant of left female breast ([place]) 09/08/2012  . Family history of malignant neoplasm of breast 09/08/2012  . S/P radiation therapy   . History of breast cancer in female 08/13/2011    Past Surgical History:  Procedure Laterality Date  . ABDOMINAL HYSTERECTOMY     with left salpingooophorectomy  . BREAST LUMPECTOMY  2009/2012   LEFT/RIGHT with lymph node dissection  . KNEE ARTHROSCOPY     right  . OOPHORECTOMY  2013    OB History    No data available       Home Medications    Prior to Admission medications   Medication Sig Start Date End Date Taking? Authorizing Provider  Bisacodyl (DULCOLAX PO) Take 1 capsule by mouth daily.    [provider]  chlorpheniramine-HYDROcodone (TUSSIONEX PENNKINETIC ER) 10-8 MG/5ML SUER Take 5 mLs by mouth every 12 (twelve) hours as needed for cough. 05/07/17   Megyn Leng, Bea Graff, PA-C  cholecalciferol 5000 units TABS Take 1 tablet (5,000 Units total) by mouth 2 (two) times daily. 07/21/15   Nicholas Lose, MD  cyclobenzaprine (FLEXERIL) 10 MG tablet Take 10 mg by mouth 3 (three) times daily as needed for muscle spasms.     [provider]  diphenhydrAMINE (BENADRYL) 25 MG tablet Take 25 mg by mouth every 6 (  six) hours as needed for allergies.     [provider]  fish oil-omega-3 fatty acids 1000 MG capsule Take 1 g by mouth daily.     [provider]  ibuprofen (ADVIL,MOTRIN) 800 MG tablet Take 800 mg by mouth every 8 (eight) hours as needed for moderate pain. For pain    [provider]  lisinopril (PRINIVIL,ZESTRIL) 10 MG tablet Take 10 mg by mouth daily.    [provider]  Multiple Vitamins-Minerals (MULTIVITAMIN & MINERAL PO) Take 1 tablet by mouth daily.    [provider]  pregabalin (LYRICA) 75 MG capsule Take 1 capsule (75 mg total) by mouth 2 (two) times daily. 03/14/12   Eston Esters, MD  propranolol (INDERAL) 20 MG tablet Take  20 mg by mouth daily. 09/10/16   [provider]  propranolol ER (INDERAL LA) 80 MG 24 hr capsule Take 1 capsule (80 mg total) by mouth daily. 05/16/15   Nicholas Lose, MD  senna (SENOKOT) 8.6 MG tablet Take 1 tablet by mouth daily as needed for constipation.     [provider]  triamcinolone cream (KENALOG) 0.1 % Apply 1 application topically 2 (two) times daily. Apply for up to one week. 12/29/16   Vanessa Kick, MD  UNABLE TO FIND Rx: L8000-Post Surgical Bra (Quantity: 6) R6789- Silicone Breast Prosthesis (Quantity: 2) Dx: 174.9; Bilateral partial mastectomy Replacement due to hot flashes since previous items were too heavy and hot 01/20/13   Fanny Skates, MD  venlafaxine XR (EFFEXOR-XR) 150 MG 24 hr capsule TAKE 1 CAPSULE BY MOUTH EVERYDAY AT BEDTIME 03/25/17   Nicholas Lose, MD  venlafaxine XR (EFFEXOR-XR) 75 MG 24 hr capsule Once daily along with 150 mg capsule to make it 225 mg total 01/07/17   Nicholas Lose, MD    Family History Family History  Problem Relation Age of Onset  . Breast cancer Mother 23  . Heart disease Father   . Prostate cancer Maternal Uncle 78  . Cancer Maternal Aunt 63       d. from "female cancer" possibly cervical  . Prostate cancer Paternal Uncle   . Prostate cancer Maternal Grandfather 5  . Prostate cancer Maternal Uncle 80  . Prostate cancer Paternal Uncle   . Breast cancer Cousin 29       mat 1st cousin    Social History Social History   Tobacco Use  . Smoking status: Former Smoker    Packs/day: 0.25    Years: 1.00    Pack years: 0.25    Types: Cigarettes  . Smokeless tobacco: Never Used  Substance Use Topics  . Alcohol use: No    Comment: 3 x a week  . Drug use: No     Allergies   Aspirin and Penicillins   Review of Systems Review of Systems  Constitutional: Negative for fever.  HENT: Positive for congestion, rhinorrhea and sneezing. Negative for ear pain and sore throat.   Respiratory: Positive for cough and  wheezing. Negative for shortness of breath.   Cardiovascular: Negative for chest pain (soreness only).  Gastrointestinal: Negative for abdominal pain, nausea and vomiting.     Physical Exam Updated Vital Signs BP (!) 152/96 (BP Location: Left Arm)   Pulse 86   Temp 99 F (37.2 C) (Oral)   Resp 18   Ht 5\' 2"  (1.575 m)   Wt 122.5 kg (270 lb)   SpO2 99%   BMI 49.38 kg/m   Physical Exam  Constitutional: She appears well-developed  and well-nourished. No distress.  HENT:  Head: Normocephalic and atraumatic.  Right Ear: Tympanic membrane normal.  Left Ear: Tympanic membrane normal.  Mouth/Throat: Oropharynx is clear and moist. No oropharyngeal exudate.  Eyes: Conjunctivae are normal. Pupils are equal, round, and reactive to light. Right eye exhibits no discharge. Left eye exhibits no discharge. No scleral icterus.  Neck: Normal range of motion. Neck supple. No thyromegaly present.  Cardiovascular: Normal rate, regular rhythm, normal heart sounds and intact distal pulses. Exam reveals no gallop and no friction rub.  No murmur heard. Pulmonary/Chest: Effort normal. No stridor. No respiratory distress. She has wheezes (R base, faint expiratory). She has no rales.  Abdominal: Soft. Bowel sounds are normal. She exhibits no distension. There is no tenderness. There is no rebound and no guarding.  Musculoskeletal: She exhibits no edema.  Lymphadenopathy:    She has no cervical adenopathy.  Neurological: She is alert. Coordination normal.  Skin: Skin is warm and dry. No rash noted. She is not diaphoretic. No pallor.  Psychiatric: She has a normal mood and affect.  Nursing note and vitals reviewed.    ED Treatments / Results  Labs (all labs ordered are listed, but only abnormal results are displayed) Labs Reviewed - No data to display  EKG  EKG Interpretation None       Radiology Dg Chest 2 View  Result Date: 05/07/2017 CLINICAL DATA:  Cough, chest tightness, history of  breast cancer EXAM: CHEST  2 VIEW COMPARISON:  12/28/2015 FINDINGS: Lungs are clear.  No pleural effusion or pneumothorax. The heart is normal in size. Mild degenerative changes of the visualized thoracolumbar spine. IMPRESSION: No evidence of acute cardiopulmonary disease. Electronically Signed   By: Julian Hy M.D.   On: 05/07/2017 12:18    Procedures Procedures (including critical care time)  Medications Ordered in ED Medications - No data to display   Initial Impression / Assessment and Plan / ED Course  I have reviewed the triage vital signs and the nursing notes.  Pertinent labs & imaging results that were available during my care of the patient were reviewed by me and considered in my medical decision making (see chart for details).     Pt symptoms consistent with URI. CXR negative for acute infiltrate. Pt will be discharged with symptomatic treatment.  Patient advised to continue Mucinex sinus as she has been having good relief at home.  She is advised to take either Tussionex OR Mucinex. I advised only taking Tussionex to help her sleep at night. Discussed return precautions.  Patient understands and agrees with plan. Pt is hemodynamically stable & in NAD prior to discharge.   Final Clinical Impressions(s) / ED Diagnoses   Final diagnoses:  Viral URI with cough    ED Discharge Orders        Ordered    chlorpheniramine-HYDROcodone (TUSSIONEX PENNKINETIC ER) 10-8 MG/5ML SUER  Every 12 hours PRN     05/07/17 1335       Talmadge Ganas, Bea Graff, PA-C 05/07/17 2209    Daleen Bo, MD 05/08/17 [phone]

## 2017-05-08 ENCOUNTER — Other Ambulatory Visit: Payer: Self-pay

## 2017-06-10 ENCOUNTER — Ambulatory Visit (HOSPITAL_COMMUNITY)
Admission: EM | Admit: 2017-06-10 | Discharge: 2017-06-10 | Disposition: A | Discharge status: Home / Self Care | Payer: PPO | Attending: Family Medicine | Admitting: Family Medicine

## 2017-06-10 ENCOUNTER — Encounter (HOSPITAL_COMMUNITY): Payer: Self-pay | Admitting: Emergency Medicine

## 2017-06-10 ENCOUNTER — Other Ambulatory Visit: Payer: Self-pay

## 2017-06-10 DIAGNOSIS — Z886 Allergy status to analgesic agent status: Secondary | ICD-10-CM | POA: Diagnosis not present

## 2017-06-10 DIAGNOSIS — R350 Frequency of micturition: Secondary | ICD-10-CM | POA: Diagnosis not present

## 2017-06-10 DIAGNOSIS — Z87891 Personal history of nicotine dependence: Secondary | ICD-10-CM | POA: Insufficient documentation

## 2017-06-10 DIAGNOSIS — R300 Dysuria: Secondary | ICD-10-CM

## 2017-06-10 DIAGNOSIS — N390 Urinary tract infection, site not specified: Secondary | ICD-10-CM

## 2017-06-10 DIAGNOSIS — R102 Pelvic and perineal pain: Secondary | ICD-10-CM | POA: Diagnosis not present

## 2017-06-10 DIAGNOSIS — Z87440 Personal history of urinary (tract) infections: Secondary | ICD-10-CM | POA: Diagnosis present

## 2017-06-10 DIAGNOSIS — Z880 Allergy status to penicillin: Secondary | ICD-10-CM | POA: Insufficient documentation

## 2017-06-10 DIAGNOSIS — Z79899 Other long term (current) drug therapy: Secondary | ICD-10-CM | POA: Diagnosis not present

## 2017-06-10 LAB — POCT URINALYSIS DIP (DEVICE)
Bilirubin Urine: NEGATIVE
Glucose, UA: NEGATIVE mg/dL
Ketones, ur: NEGATIVE mg/dL
Nitrite: NEGATIVE
Protein, ur: NEGATIVE mg/dL
Specific Gravity, Urine: 1.02 (ref 1.005–1.030)
Urobilinogen, UA: 0.2 mg/dL (ref 0.0–1.0)
pH: 6 (ref 5.0–8.0)

## 2017-06-10 MED ORDER — KETOROLAC TROMETHAMINE 30 MG/ML IJ SOLN
INTRAMUSCULAR | Status: AC
  Filled 2017-06-10: qty 1

## 2017-06-10 MED ORDER — SULFAMETHOXAZOLE-TRIMETHOPRIM 800-160 MG PO TABS: 1.0000 | ORAL_TABLET | Freq: Two times a day (BID) | ORAL | 0 refills | Status: AC

## 2017-06-10 MED ORDER — KETOROLAC TROMETHAMINE 30 MG/ML IJ SOLN
30.0000 mg | Freq: Once | INTRAMUSCULAR | Status: AC
  Administered 2017-06-10: 30 mg via INTRAMUSCULAR

## 2017-06-10 NOTE — ED Triage Notes (Signed)
Pt reports vaginal and suprapubic pain along with urinary retention and urgency to urinate.  Pt states this is a recurrent issue and her PCP usually prescribes her pain medicine and antibiotics.

## 2017-06-10 NOTE — Discharge Instructions (Signed)
Continue to increase water intake to ensure adequate hydration and regular emptying of bladder. Complete course of antibiotics.  Tylenol and/or ibuprofen as needed for pain or fevers. Do not take additional ibuprofen for another 6 hours as we have provided you with toradol in clinic today. If symptoms worsen or do not improve in the next week to return to be seen or to follow up with PCP.  Please follow up for a recheck with your primary care provider as you may need urology referral if symptoms continue to reoccur.

## 2017-06-10 NOTE — ED Provider Notes (Signed)
[place]    CSN: [id number] Arrival date & time: 06/10/17  1129     History   Chief Complaint Chief Complaint  Patient presents with  . Recurrent UTI    HPI Ashley Lindsey is a 57 y.o. female.   Ashley Lindsey presents with complaints of urinary frequency, urgency and pressure pain with urination which started 3 days ago and has been worsening. No blood to urine. Without back or flank pain. Denies nausea, vomiting. Denies vaginal symptoms. No known fevers. States has had UTI's more frequently, last approximately 2 months ago and approximately 2 months prior to that as well. Has not followed with urology. toradol helped in the past. Took motrin last night which did seem to help, has not taken today. No history of kidney stones. Took Azo which helped as well, took last night most recently. History of breast cancer. History of hysterectomy and oophorectomy.     ROS per HPI.          Past Medical History:  Diagnosis Date  . Abdominal cramps   . Arthritis   . Cancer [facility])    s/p radiation- last dose 6/12//has been off Tamoxifen 8/12  . Chronic headaches   . Costochondritis   . Fatigue   . Fibromyalgia 03/06/2013  . Lymphedema    RIGHT ARM-////  STATES USE LEFT ARM FOR BP'S  . Migraines   . Muscle spasms of head and/or neck    following to the breast  . Passed out [facility])    4th of july  . Personal history of radiation therapy 2010   lt breast   . S/P radiation therapy 08/19/07 - 10/10/07   Left Breast/5040 cGy/28 fractions with Boost for a toatl dose of 6300 dGy  . S/P radiation therapy 08/14/10 -10/03/10   right breast  . Sleep apnea    STOP BANG SCORE 5    Patient Active Problem List   Diagnosis Date Noted  . Breast cancer of lower-outer quadrant of right female breast ([place]) 12/02/2014  . Influenzal bronchitis 09/22/2014  . Influenza B 09/22/2014  . Bronchospasm 09/21/2014  . Acute bronchitis 09/21/2014  . Hypoxia 09/21/2014  . Dyspnea 09/21/2014  . Leg  pain 09/21/2014  . Fibromyalgia 03/06/2013  . Dense breasts 03/06/2013  . Mastalgia 03/06/2013  . Back pain, lumbosacral 03/06/2013  . Lymphedema of arm 03/06/2013  . Atypical chest pain 01/06/2013  . Breast cancer of upper-inner quadrant of left female breast ([place]) 09/08/2012  . Family history of malignant neoplasm of breast 09/08/2012  . S/P radiation therapy   . History of breast cancer in female 08/13/2011    Past Surgical History:  Procedure Laterality Date  . ABDOMINAL HYSTERECTOMY     with left salpingooophorectomy  . BREAST LUMPECTOMY  2009/2012   LEFT/RIGHT with lymph node dissection  . KNEE ARTHROSCOPY     right  . OOPHORECTOMY  2013    OB History    No data available       Home Medications    Prior to Admission medications   Medication Sig Start Date End Date Taking? Authorizing Provider  Bisacodyl (DULCOLAX PO) Take 1 capsule by mouth daily.   Yes [provider]  cyclobenzaprine (FLEXERIL) 10 MG tablet Take 10 mg by mouth 3 (three) times daily as needed for muscle spasms.    Yes [provider]  diphenhydrAMINE (BENADRYL) 25 MG tablet Take 25 mg by mouth every 6 (six) hours as needed for allergies.  Yes [provider]  fish oil-omega-3 fatty acids 1000 MG capsule Take 1 g by mouth daily.    Yes [provider]  lisinopril (PRINIVIL,ZESTRIL) 10 MG tablet Take 10 mg by mouth daily.   Yes [provider]  Multiple Vitamins-Minerals (MULTIVITAMIN & MINERAL PO) Take 1 tablet by mouth daily.   Yes [provider]  propranolol (INDERAL) 20 MG tablet Take 20 mg by mouth daily. 09/10/16  Yes [provider]  senna (SENOKOT) 8.6 MG tablet Take 1 tablet by mouth daily as needed for constipation.    Yes [provider]  venlafaxine XR (EFFEXOR-XR) 150 MG 24 hr capsule TAKE 1 CAPSULE BY MOUTH EVERYDAY AT BEDTIME 03/25/17  Yes Nicholas Lose, MD  venlafaxine XR (EFFEXOR-XR) 75 MG 24 hr capsule Once daily  along with 150 mg capsule to make it 225 mg total 01/07/17  Yes Nicholas Lose, MD  cholecalciferol 5000 units TABS Take 1 tablet (5,000 Units total) by mouth 2 (two) times daily. 07/21/15   Nicholas Lose, MD  ibuprofen (ADVIL,MOTRIN) 800 MG tablet Take 800 mg by mouth every 8 (eight) hours as needed for moderate pain. For pain    [provider]  pregabalin (LYRICA) 75 MG capsule Take 1 capsule (75 mg total) by mouth 2 (two) times daily. 03/14/12   Eston Esters, MD  propranolol ER (INDERAL LA) 80 MG 24 hr capsule Take 1 capsule (80 mg total) by mouth daily. 05/16/15   Nicholas Lose, MD  sulfamethoxazole-trimethoprim (BACTRIM DS,SEPTRA DS) 800-160 MG tablet Take 1 tablet by mouth 2 (two) times daily for 7 days. 06/10/17 06/17/17  Augusto Gamble B, NP  triamcinolone cream (KENALOG) 0.1 % Apply 1 application topically 2 (two) times daily. Apply for up to one week. 12/29/16   Vanessa Kick, MD  UNABLE TO FIND Rx: L8000-Post Surgical Bra (Quantity: 6) O6712- Silicone Breast Prosthesis (Quantity: 2) Dx: 174.9; Bilateral partial mastectomy Replacement due to hot flashes since previous items were too heavy and hot 01/20/13   Fanny Skates, MD    Family History Family History  Problem Relation Age of Onset  . Breast cancer Mother 72  . Heart disease Father   . Prostate cancer Maternal Uncle 78  . Cancer Maternal Aunt 63       d. from "female cancer" possibly cervical  . Prostate cancer Paternal Uncle   . Prostate cancer Maternal Grandfather 26  . Prostate cancer Maternal Uncle 80  . Prostate cancer Paternal Uncle   . Breast cancer Cousin 66       mat 1st cousin    Social History Social History   Tobacco Use  . Smoking status: Former Smoker    Packs/day: 0.25    Years: 1.00    Pack years: 0.25    Types: Cigarettes  . Smokeless tobacco: Never Used  Substance Use Topics  . Alcohol use: No    Comment: 3 x a week  . Drug use: No     Allergies   Aspirin and Penicillins   Review  of Systems Review of Systems   Physical Exam Triage Vital Signs ED Triage Vitals  Enc Vitals Group     BP 06/10/17 1247 (!) 144/79     Pulse Rate 06/10/17 1247 (!) 106     Resp --      Temp 06/10/17 1247 98.7 F (37.1 C)     Temp Source 06/10/17 1247 Oral     SpO2 06/10/17 1247 99 %     Weight --  Height --      Head Circumference --      Peak Flow --      Pain Score 06/10/17 1244 10     Pain Loc --      Pain Edu? --      Excl. in [place]? --    No data found.  Updated Vital Signs BP (!) 144/79 (BP Location: Left Arm)   Pulse (!) 106   Temp 98.7 F (37.1 C) (Oral)   SpO2 99%   Visual Acuity Right Eye Distance:   Left Eye Distance:   Bilateral Distance:    Right Eye Near:   Left Eye Near:    Bilateral Near:     Physical Exam  Constitutional: She is oriented to person, place, and time. She appears well-developed and well-nourished. No distress.  Cardiovascular: Regular rhythm and normal heart sounds. Tachycardia present.  Pulmonary/Chest: Effort normal and breath sounds normal.  Abdominal: There is tenderness in the suprapubic area. There is no rigidity, no rebound, no guarding and no CVA tenderness.  Mild suprapubic tenderness on exam; without cva tenderness  Neurological: She is alert and oriented to person, place, and time.  Skin: Skin is warm and dry.     UC Treatments / Results  Labs (all labs ordered are listed, but only abnormal results are displayed) Labs Reviewed  POCT URINALYSIS DIP (DEVICE) - Abnormal; Notable for the following components:      Result Value   Hgb urine dipstick SMALL (*)    Leukocytes, UA SMALL (*)    All other components within normal limits  URINE CULTURE    EKG  EKG Interpretation None       Radiology No results found.  Procedures Procedures (including critical care time)  Medications Ordered in UC Medications  ketorolac (TORADOL) 30 MG/ML injection 30 mg (not administered)     Initial Impression /  Assessment and Plan / UC Course  I have reviewed the triage vital signs and the nursing notes.  Pertinent labs & imaging results that were available during my care of the patient were reviewed by me and considered in my medical decision making (see chart for details).     Non toxic in appearance. Noted slight elevation in HR. Leukocytes and hgb to urine, symptoms consistent with UTI. Will treat as such. Culture pending. toradol provided in clinic for pain. Return precautions provided. If symptoms worsen or do not improve in the next week to return to be seen or to follow up with PCP.  Patient verbalized understanding and agreeable to plan.    Final Clinical Impressions(s) / UC Diagnoses   Final diagnoses:  Urinary tract infection without hematuria, site unspecified    ED Discharge Orders        Ordered    sulfamethoxazole-trimethoprim (BACTRIM DS,SEPTRA DS) 800-160 MG tablet  2 times daily     06/10/17 1308       Controlled Substance Prescriptions [place] Controlled Substance Registry consulted? Not Applicable   Zigmund Gottron, NP 06/10/17 1316

## 2017-06-12 LAB — URINE CULTURE: Culture: >=100000 — AB

## 2017-06-17 DIAGNOSIS — L309 Dermatitis, unspecified: Secondary | ICD-10-CM | POA: Diagnosis not present

## 2017-06-17 DIAGNOSIS — R21 Rash and other nonspecific skin eruption: Secondary | ICD-10-CM | POA: Diagnosis not present

## 2017-07-19 DIAGNOSIS — I10 Essential (primary) hypertension: Secondary | ICD-10-CM | POA: Diagnosis not present

## 2017-07-19 DIAGNOSIS — Z1231 Encounter for screening mammogram for malignant neoplasm of breast: Secondary | ICD-10-CM | POA: Diagnosis not present

## 2017-07-19 DIAGNOSIS — Z0000 Encounter for general adult medical examination without abnormal findings: Secondary | ICD-10-CM | POA: Diagnosis not present

## 2017-07-19 DIAGNOSIS — Z1389 Encounter for screening for other disorder: Secondary | ICD-10-CM | POA: Diagnosis not present

## 2017-07-19 DIAGNOSIS — R350 Frequency of micturition: Secondary | ICD-10-CM | POA: Diagnosis not present

## 2017-08-12 ENCOUNTER — Ambulatory Visit (INDEPENDENT_AMBULATORY_CARE_PROVIDER_SITE_OTHER): Payer: Self-pay | Admitting: Orthopaedic Surgery

## 2017-08-30 ENCOUNTER — Ambulatory Visit (INDEPENDENT_AMBULATORY_CARE_PROVIDER_SITE_OTHER): Payer: Self-pay | Admitting: Orthopaedic Surgery

## 2017-09-06 ENCOUNTER — Ambulatory Visit (INDEPENDENT_AMBULATORY_CARE_PROVIDER_SITE_OTHER): Payer: Self-pay | Admitting: Orthopaedic Surgery

## 2017-09-19 ENCOUNTER — Telehealth: Payer: Self-pay

## 2017-09-19 NOTE — Telephone Encounter (Signed)
Returned pt's call regarding she is having achy, swollen, warmer than usual, hard left breast area to arm.  Denies any redness to site.  Pt reports she has had pain there that could be related to her fibromyalgia that Dr Lindi Adie is aware of but reports this is worse than usual. Denies fever. Pt has agreed to come in to have it checked out.   Appt made with symptom management, Sandi Mealy for 10 am on 5/31. No other needs per pt at this time.

## 2017-09-20 ENCOUNTER — Inpatient Hospital Stay: Payer: PPO | Attending: Medical | Admitting: Medical

## 2017-09-20 VITALS — BP 142/93 | HR 104 | Temp 98.1°F | Resp 20 | Ht 62.0 in | Wt 289.3 lb

## 2017-09-20 DIAGNOSIS — M797 Fibromyalgia: Secondary | ICD-10-CM

## 2017-09-20 DIAGNOSIS — Z8042 Family history of malignant neoplasm of prostate: Secondary | ICD-10-CM | POA: Diagnosis not present

## 2017-09-20 DIAGNOSIS — R0602 Shortness of breath: Secondary | ICD-10-CM | POA: Diagnosis not present

## 2017-09-20 DIAGNOSIS — Z87891 Personal history of nicotine dependence: Secondary | ICD-10-CM | POA: Insufficient documentation

## 2017-09-20 DIAGNOSIS — I890 Lymphedema, not elsewhere classified: Secondary | ICD-10-CM | POA: Diagnosis not present

## 2017-09-20 DIAGNOSIS — Z170 Estrogen receptor positive status [ER+]: Secondary | ICD-10-CM | POA: Diagnosis not present

## 2017-09-20 DIAGNOSIS — N644 Mastodynia: Secondary | ICD-10-CM | POA: Insufficient documentation

## 2017-09-20 DIAGNOSIS — R509 Fever, unspecified: Secondary | ICD-10-CM | POA: Diagnosis not present

## 2017-09-20 DIAGNOSIS — Z79810 Long term (current) use of selective estrogen receptor modulators (SERMs): Secondary | ICD-10-CM | POA: Insufficient documentation

## 2017-09-20 DIAGNOSIS — M129 Arthropathy, unspecified: Secondary | ICD-10-CM | POA: Diagnosis not present

## 2017-09-20 DIAGNOSIS — R51 Headache: Secondary | ICD-10-CM | POA: Insufficient documentation

## 2017-09-20 DIAGNOSIS — G4730 Sleep apnea, unspecified: Secondary | ICD-10-CM | POA: Diagnosis not present

## 2017-09-20 DIAGNOSIS — Z803 Family history of malignant neoplasm of breast: Secondary | ICD-10-CM | POA: Insufficient documentation

## 2017-09-20 DIAGNOSIS — Z760 Encounter for issue of repeat prescription: Secondary | ICD-10-CM

## 2017-09-20 DIAGNOSIS — Z923 Personal history of irradiation: Secondary | ICD-10-CM

## 2017-09-20 DIAGNOSIS — D0512 Intraductal carcinoma in situ of left breast: Secondary | ICD-10-CM

## 2017-09-20 MED ORDER — HYDROCODONE-ACETAMINOPHEN 5-325 MG PO TABS: 1.0000 | ORAL_TABLET | Freq: Four times a day (QID) | ORAL | 0 refills | Status: DC | PRN

## 2017-09-20 MED ORDER — ALBUTEROL SULFATE HFA 108 (90 BASE) MCG/ACT IN AERS: 1.0000 | INHALATION_SPRAY | Freq: Four times a day (QID) | RESPIRATORY_TRACT | 1 refills | Status: DC | PRN

## 2017-09-20 MED ORDER — DOXYCYCLINE HYCLATE 100 MG PO TABS
100.0000 mg | ORAL_TABLET | Freq: Two times a day (BID) | ORAL | 0 refills | Status: DC
Start: 2017-09-20 — End: 2018-05-06

## 2017-09-20 NOTE — Progress Notes (Signed)
Symptoms Management Clinic Progress Note   Ashley Lindsey [id number] Oct 24, 1960 57 y.o.  Ashley Lindsey is managed by Dr. Nicholas Lose  Actively treated with chemotherapy: no   Assessment: Plan:    Breast pain, left - Plan: MM Digital Diagnostic Bilat  Lymphedema of left arm - Plan: Ambulatory referral to Physical Therapy  Encounter for medication refill   Left breast pain: A mammogram has been ordered.  I have stated in order that an ultrasound of the left breast can be completed if indicated.  She has been given a prescription for Vicodin to use as needed.  Additionally she was placed on doxycycline 100 mg p.o. twice daily given her report of subjective fevers and chills.  Lymphedema of the left arm: A referral has been made to the lymphedema clinic.  Medication refill: The patient was given a refill of her albuterol inhaler with instructions to use 1 to 2 puffs every 6 hours as needed.  Please see After Visit Summary for patient specific instructions.  Future Appointments  Date Time Provider [place]  01/07/2018  8:45 AM Nicholas Lose, MD [facility] None    Orders Placed This Encounter  Procedures  . MM Digital Diagnostic Bilat  . Ambulatory referral to Physical Therapy       Subjective:   Patient ID:  Ashley Lindsey is a 57 y.o. (DOB [birth year]) female.  Chief Complaint:  Chief Complaint  Patient presents with  . Edema    HPI Ashley Lindsey is a 57 year old female with a DCIS and T1 N0 M0 stage IA invasive ductal carcinoma 0.4-0.5 cm grade 1, ER 77%, PR 96%, Ki-67 less than 5%, HER-2 negative of the left breast. She is status post a left lumpectomy followed by adjuvant radiation therapy and anti-estrogen therapy with Tamoxifen. She presents to the office today with an area that is "achy, swollen, warmer than usual" and hard in her left breast and upper arm.    This is not been increasing in severity recently.  She states that she is pending a  mammogram however on review of her chart I do not see an order for a mammogram.  She reports having subjective fevers and chills.  She request a refill of her albuterol inhaler.  She has been having some shortness of breath with exertion.  Medications: I have reviewed the patient's current medications.  Allergies:  Allergies  Allergen Reactions  . Aspirin     unknown  . Penicillins Hives and Swelling    Has patient had a PCN reaction causing immediate rash, facial/tongue/throat swelling, SOB or lightheadedness with hypotension: Yes Has patient had a PCN reaction causing severe rash involving mucus membranes or skin necrosis: Yes Has patient had a PCN reaction that required hospitalization Yes Has patient had a PCN reaction occurring within the last 10 years: No If all of the above answers are "NO", then may proceed with Cephalosporin use.     Past Medical History:  Diagnosis Date  . Abdominal cramps   . Arthritis   . Cancer [facility])    s/p radiation- last dose 6/12//has been off Tamoxifen 8/12  . Chronic headaches   . Costochondritis   . Fatigue   . Fibromyalgia 03/06/2013  . Lymphedema    RIGHT ARM-////  STATES USE LEFT ARM FOR BP'S  . Migraines   . Muscle spasms of head and/or neck    following to the breast  . Passed out [facility])    4th of july  .  Personal history of radiation therapy 2010   lt breast   . S/P radiation therapy 08/19/07 - 10/10/07   Left Breast/5040 cGy/28 fractions with Boost for a toatl dose of 6300 dGy  . S/P radiation therapy 08/14/10 -10/03/10   right breast  . Sleep apnea    STOP BANG SCORE 5    Past Surgical History:  Procedure Laterality Date  . ABDOMINAL HYSTERECTOMY     with left salpingooophorectomy  . BREAST LUMPECTOMY  2009/2012   LEFT/RIGHT with lymph node dissection  . KNEE ARTHROSCOPY     right  . OOPHORECTOMY  2013    Family History  Problem Relation Age of Onset  . Breast cancer Mother 76  . Heart disease Father   . Prostate  cancer Maternal Uncle 78  . Cancer Maternal Aunt 63       d. from "female cancer" possibly cervical  . Prostate cancer Paternal Uncle   . Prostate cancer Maternal Grandfather 74  . Prostate cancer Maternal Uncle 80  . Prostate cancer Paternal Uncle   . Breast cancer Cousin 76       mat 1st cousin    Social History   Socioeconomic History  . Marital status: Divorced    Spouse name: Not on file  . Number of children: Not on file  . Years of education: Not on file  . Highest education level: Not on file  Occupational History  . Not on file  Social Needs  . Financial resource strain: Not on file  . Food insecurity:    Worry: Not on file    Inability: Not on file  . Transportation needs:    Medical: Not on file    Non-medical: Not on file  Tobacco Use  . Smoking status: Former Smoker    Packs/day: 0.25    Years: 1.00    Pack years: 0.25    Types: Cigarettes  . Smokeless tobacco: Never Used  Substance and Sexual Activity  . Alcohol use: No    Comment: 3 x a week  . Drug use: No  . Sexual activity: Never    Birth control/protection: Surgical  Lifestyle  . Physical activity:    Days per week: Not on file    Minutes per session: Not on file  . Stress: Not on file  Relationships  . Social connections:    Talks on phone: Not on file    Gets together: Not on file    Attends religious service: Not on file    Active member of club or organization: Not on file    Attends meetings of clubs or organizations: Not on file    Relationship status: Not on file  . Intimate partner violence:    Fear of current or ex partner: Not on file    Emotionally abused: Not on file    Physically abused: Not on file    Forced sexual activity: Not on file  Other Topics Concern  . Not on file  Social History Narrative  . Not on file    Past Medical History, Surgical history, Social history, and Family history were reviewed and updated as appropriate.   Please see review of systems for  further details on the patient's review from today.   Review of Systems:  Review of Systems  Constitutional: Positive for chills and fever (Subjective fever). Negative for diaphoresis.  Respiratory: Positive for cough, shortness of breath and wheezing. Negative for chest tightness.   Skin:  Left lateral breast pain with left upper extremity lymphedema.    Objective:   Physical Exam:  BP (!) 142/93 (BP Location: Right Arm, Patient Position: Sitting) Comment: Liza RN is aware  Pulse (!) 104 Comment: Liza RN is aware  Temp 98.1 F (36.7 C) (Oral)   Resp 20   Ht 5' 2"  (1.575 m)   Wt 289 lb 4.8 oz (131.2 kg)   SpO2 98%   BMI 52.91 kg/m  ECOG: 0  Physical Exam  Constitutional: No distress.  HENT:  Head: Normocephalic and atraumatic.  Neck: Normal range of motion. Neck supple.  Cardiovascular: S1 normal, S2 normal and normal heart sounds. Tachycardia present.  No murmur heard. Pulmonary/Chest: Effort normal and breath sounds normal. No respiratory distress. She has no wheezes. She has no rales.    Musculoskeletal:       Arms: Lymphadenopathy:    She has no cervical adenopathy.  Neurological: She is alert. Coordination normal.  Skin: Skin is warm and dry. She is not diaphoretic.  Psychiatric: She has a normal mood and affect. Her behavior is normal.    Lab Review:     Component Value Date/Time   NA 139 01/07/2017 0920   K 4.1 01/07/2017 0920   CL 107 09/16/2015 2159   CL 105 12/26/2011 1538   CO2 25 01/07/2017 0920   GLUCOSE 111 01/07/2017 0920   GLUCOSE 103 (H) 12/26/2011 1538   BUN 8.7 01/07/2017 0920   CREATININE 0.8 01/07/2017 0920   CALCIUM 8.9 01/07/2017 0920   PROT 7.0 01/07/2017 0920   ALBUMIN 3.5 01/07/2017 0920   AST 47 (H) 01/07/2017 0920   ALT 54 01/07/2017 0920   ALKPHOS 73 01/07/2017 0920   BILITOT 0.22 01/07/2017 0920   GFRNONAA >60 09/16/2015 2159   GFRAA >60 09/16/2015 2159       Component Value Date/Time   WBC 6.0 01/07/2017 0920    WBC 9.6 09/16/2015 2159   RBC 4.39 01/07/2017 0920   RBC 4.45 09/16/2015 2159   HGB 11.5 (L) 01/07/2017 0920   HCT 37.6 01/07/2017 0920   PLT 274 01/07/2017 0920   MCV 85.6 01/07/2017 0920   MCH 26.2 01/07/2017 0920   MCH 26.5 09/16/2015 2159   MCHC 30.6 (L) 01/07/2017 0920   MCHC 31.4 09/16/2015 2159   RDW 14.4 01/07/2017 0920   LYMPHSABS 2.0 01/07/2017 0920   MONOABS 0.6 01/07/2017 0920   EOSABS 0.3 01/07/2017 0920   BASOSABS 0.0 01/07/2017 0920   -------------------------------  Imaging from last 24 hours (if applicable):  Radiology interpretation: No results found.

## 2017-09-25 ENCOUNTER — Other Ambulatory Visit: Payer: Self-pay | Admitting: Medical

## 2017-09-25 DIAGNOSIS — N644 Mastodynia: Secondary | ICD-10-CM

## 2017-10-01 ENCOUNTER — Ambulatory Visit: Payer: PPO | Attending: Medical | Admitting: Physical Therapy

## 2017-10-09 ENCOUNTER — Ambulatory Visit: Payer: PPO | Admitting: Physical Therapy

## 2017-10-17 ENCOUNTER — Other Ambulatory Visit: Payer: PPO

## 2017-10-21 ENCOUNTER — Ambulatory Visit: Payer: PPO | Attending: Medical | Admitting: Physical Therapy

## 2017-10-21 ENCOUNTER — Other Ambulatory Visit: Payer: Self-pay

## 2017-10-21 DIAGNOSIS — M25611 Stiffness of right shoulder, not elsewhere classified: Secondary | ICD-10-CM

## 2017-10-21 DIAGNOSIS — M25612 Stiffness of left shoulder, not elsewhere classified: Secondary | ICD-10-CM | POA: Insufficient documentation

## 2017-10-21 DIAGNOSIS — I890 Lymphedema, not elsewhere classified: Secondary | ICD-10-CM | POA: Diagnosis not present

## 2017-10-21 NOTE — Therapy (Signed)
[place], [place], [address] Phone: [phone]   Fax:  [phone]  Physical Therapy Evaluation  Patient Details  Name: Ashley Lindsey MRN: [id number] Date of Birth: 07-14-1960 Referring Provider: Sandi Mealy, PA   Encounter Date: 10/21/2017  PT End of Session - 10/21/17 2036    Visit Number  1    Number of Visits  9    Date for PT Re-Evaluation  11/22/17    PT Start Time  0349    PT Stop Time  1515    PT Time Calculation (min)  43 min    Activity Tolerance  Patient tolerated treatment well    Behavior During Therapy  [facility] for tasks assessed/performed       Past Medical History:  Diagnosis Date  . Abdominal cramps   . Arthritis   . Cancer [facility])    s/p radiation- last dose 6/12//has been off Tamoxifen 8/12  . Chronic headaches   . Costochondritis   . Fatigue   . Fibromyalgia 03/06/2013  . Lymphedema    RIGHT ARM-////  STATES USE LEFT ARM FOR BP'S  . Migraines   . Muscle spasms of head and/or neck    following to the breast  . Passed out [facility])    4th of july  . Personal history of radiation therapy 2010   lt breast   . S/P radiation therapy 08/19/07 - 10/10/07   Left Breast/5040 cGy/28 fractions with Boost for a toatl dose of 6300 dGy  . S/P radiation therapy 08/14/10 -10/03/10   right breast  . Sleep apnea    STOP BANG SCORE 5    Past Surgical History:  Procedure Laterality Date  . ABDOMINAL HYSTERECTOMY     with left salpingooophorectomy  . BREAST LUMPECTOMY  2009/2012   LEFT/RIGHT with lymph node dissection  . KNEE ARTHROSCOPY     right  . OOPHORECTOMY  2013    There were no vitals filed for this visit.   Subjective Assessment - 10/21/17 1436    Subjective  I was supposed to have come to you guys the beginning of the year. I thought I had everything in line, but my arm started swelling again in March. I told Dr. Lindi Adie that the left arm gives me the most headaches (both arms  swell). 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 When we had a patient with the swelling we wrapped their arms." Swelling will get worse and better for unpredictable reason.    Pertinent History  Left breast cancer diagnosed with lumpectomy with 9 lymphnodes removed in 2013; right breast cancer about six months later with lumpectomy and 13 lymphnodes removed in 2014. Diagnosed with fibromyalgia. Had a compression sleeve but doesn't know where it is. Had a chemoradiation for both breasts at [facility]. HTN controlled with meds "at times." Ex-smoker.    Patient Stated Goals  to learn how to manage this arm when it swells    Currently in Pain?  Yes    Pain Score  6     Pain Location  Arm    Pain Orientation  Left    Pain Descriptors / Indicators  Aching    Aggravating Factors   putting bandages on the arm, moving the left arm    Pain Relieving Factors  propping up the arm, taking ibuprofen and muscle relaxant    Effect of Pain on Daily Activities  wakes her up at night  [facility] PT Assessment - 10/21/17 0001      Assessment   Medical Diagnosis  left and right breast cancer in 2013-2014    Referring Provider  Sandi Mealy, PA    Onset Date/Surgical Date  -- 2013-2014    Hand Dominance  Right    Prior Therapy  none      Precautions   Precautions  Other (comment)    Precaution Comments  cancer precautions      Restrictions   Weight Bearing Restrictions  No      Balance Screen   Has the patient fallen in the past 6 months  No    Has the patient had a decrease in activity level because of a fear of falling?   No    Is the patient reluctant to leave their home because of a fear of falling?   No      Observation/Other Assessments   Other Surveys   -- lymph life impact scale score = 32.35      ROM / Strength   AROM / PROM / Strength  AROM      AROM   Overall AROM Comments  both shoulders to only approx. 95 degrees flexion and abduction, and pt. reports pain with doing these     AROM Assessment Site  Shoulder    Right/Left Shoulder  Right;Left    Right Shoulder Flexion  111 Degrees    Right Shoulder ABduction  90 Degrees    Left Shoulder Flexion  103 Degrees    Left Shoulder ABduction  65 Degrees discomfort in left breast with that      PROM   PROM Assessment Site  Shoulder    Right/Left Shoulder  Right;Left      Ambulation/Gait   Ambulation/Gait  Yes    Ambulation/Gait Assistance  6: Modified independent (Device/Increase time) has knee pain form arthritis    Assistive device  None    Gait Comments  Pt. has been told by Dr. Durward Fortes to lose 40 lbs. before having knee replacement surgery        LYMPHEDEMA/ONCOLOGY QUESTIONNAIRE - 10/21/17 1457      Type   Cancer Type  right and left breast cancer      Surgeries   Lumpectomy Date  -- 2013-2014      Treatment   Past Chemotherapy Treatment  Yes    Date  -- with radiation    Past Radiation Treatment  Yes    Body Site  breast tissue both sides      What other symptoms do you have   Are you having pitting edema  No    [place]mer Sign  No      Lymphedema Assessments   Lymphedema Assessments  Upper extremities      Right Upper Extremity Lymphedema   15 cm Proximal to Olecranon Process  44.2 cm    10 cm Proximal to Olecranon Process  42.3 cm    Olecranon Process  28.9 cm    15 cm Proximal to Ulnar Styloid Process  28.9 cm    10 cm Proximal to Ulnar Styloid Process  25.5 cm    Just Proximal to Ulnar Styloid Process  18.4 cm    Across Hand at [employer]  20.5 cm    At [place] of 2nd Digit  6.6 cm      Left Upper Extremity Lymphedema   15 cm Proximal to Olecranon Process  44.9 cm    10 cm Proximal to  Olecranon Process  43.4 cm    Olecranon Process  29.2 cm    15 cm Proximal to Ulnar Styloid Process  28.8 cm    10 cm Proximal to Ulnar Styloid Process  25.3 cm    Just Proximal to Ulnar Styloid Process  18.7 cm    Across Hand at [employer]  19.5 cm    At [place] of 2nd Digit  6.8 cm              Outpatient Rehab from 10/21/[facility]  Lymphedema Life Impact Scale Total Score  32.35 %      Objective measurements completed on examination: See above findings.              PT Education - 10/21/17 2039    Education Details  NOT to wrap her arm with an ACE bandage    Person(s) Educated  Patient    Methods  Explanation    Comprehension  Verbalized understanding          PT Long Term Goals - 10/21/17 2046      PT LONG TERM GOAL #1   Title  Pt. will be independent with performing manual lymph drainage    Time  4    Period  Weeks    Status  New      PT LONG TERM GOAL #2   Title  Pt. will know where and how to obtain an appropriate compression sleeve and glove    Time  4    Period  Weeks    Status  New      PT LONG TERM GOAL #3   Title  Pt. will be independent with HEP for bilateral shoulder ROM and strengthening    Time  4    Period  Weeks    Status  New      PT LONG TERM GOAL #4   Title  Bilateral shoulder flexion to at least 135 degrees for improved overhead reach    Baseline  111 right, 103 left on eval on 10/21/17    Time  4    Period  Weeks    Status  New      PT LONG TERM GOAL #5   Title  bilateral shoulder abduction to at least 120 degrees for improved ADLs    Baseline  90 right, 65 left on eval 10/21/17    Time  4    Period  Weeks    Status  New      Additional Long Term Goals   Additional Long Term Goals  Yes      PT LONG TERM GOAL #6   Title  LLIS score reduced to 23 or below    Baseline  32.35 on eval    Time  4    Period  Weeks    Status  New             Plan - 10/21/17 2037    Clinical Impression Statement  This is a pleasant woman with a h/o left breast cancer first, then right breast cancer about 6 months later, both 2013-2014. She had a number of lymph nodes removed on each side and had chemoradiation to both sides. She reports intermittent swelling or worsening of  swelling in the left arm, but gets some swelling of the right arm too. Today there is some difference between the right and left, with left upper arm a bit bigger and small differences at  other levels. She had a compression sleeve but can't currently find it. She also has limited AROM of both shoulders.     Clinical Decision Making  Low    Rehab Potential  Good    PT Frequency  2x / week    PT Duration  4 weeks    PT Treatment/Interventions  ADLs/Self Care Home Management;DME Instruction;Patient/family education;Orthotic Fit/Training;Manual techniques;Manual lymph drainage;Compression bandaging;Scar mobilization;Passive range of motion;Taping    PT Next Visit Plan  Perform and teach manual lymph drainage for left UE, directing towards left groin only. Later, assist with obtaining a compression sleeve and gauntlet, possibly through [facility]. Also begin P/AA/A/ROM of both shoulders and instruct in HEP for same.    Consulted and Agree with Plan of Care  Patient       Patient will benefit from skilled therapeutic intervention in order to improve the following deficits and impairments:  Increased edema, Decreased range of motion, Impaired UE functional use  Visit Diagnosis: Lymphedema, not elsewhere classified - Plan: PT plan of care cert/re-cert  Stiffness of left shoulder, not elsewhere classified - Plan: PT plan of care cert/re-cert  Stiffness of right shoulder, not elsewhere classified - Plan: PT plan of care cert/re-cert     Problem List Patient Active Problem List   Diagnosis Date Noted  . Breast cancer of lower-outer quadrant of right female breast ([place]) 12/02/2014  . Influenzal bronchitis 09/22/2014  . Influenza B 09/22/2014  . Bronchospasm 09/21/2014  . Acute bronchitis 09/21/2014  . Hypoxia 09/21/2014  . Dyspnea 09/21/2014  . Leg pain 09/21/2014  . Fibromyalgia 03/06/2013  . Dense breasts 03/06/2013  . Mastalgia 03/06/2013  . Back pain, lumbosacral 03/06/2013  . Lymphedema of  arm 03/06/2013  . Atypical chest pain 01/06/2013  . Breast cancer of upper-inner quadrant of left female breast ([place]) 09/08/2012  . Family history of malignant neoplasm of breast 09/08/2012  . S/P radiation therapy   . History of breast cancer in female 08/13/2011    [facility] 10/21/2017, 8:53 PM  [place], [place], [address] Phone: [phone]   Fax:  [phone]  Name: Ashley Lindsey MRN: [id number] Date of Birth: December 15, 1960  Serafina Royals, PT 10/21/17 8:53 PM

## 2017-10-30 ENCOUNTER — Other Ambulatory Visit: Payer: PPO

## 2017-11-16 ENCOUNTER — Other Ambulatory Visit: Payer: Self-pay | Admitting: Hematology and Oncology

## 2017-11-26 ENCOUNTER — Other Ambulatory Visit: Payer: Self-pay

## 2017-11-26 ENCOUNTER — Telehealth: Payer: Self-pay

## 2017-11-26 MED ORDER — VENLAFAXINE HCL ER 150 MG PO CP24: ORAL_CAPSULE | ORAL | 0 refills | Status: DC

## 2017-11-26 MED ORDER — VENLAFAXINE HCL ER 75 MG PO CP24: ORAL_CAPSULE | ORAL | 0 refills | Status: DC

## 2017-11-26 MED ORDER — HYDROCODONE-ACETAMINOPHEN 5-325 MG PO TABS: 1.0000 | ORAL_TABLET | Freq: Four times a day (QID) | ORAL | 0 refills | Status: DC | PRN

## 2017-11-26 NOTE — Telephone Encounter (Signed)
Returned patient's call.  Patient continues with swelling and pain in left arm.  Patient is awaiting her compression sleeve, patient states, "it is taking a little longer because I am going through an assistance program."  Patient unable to tolerate PT d/t pain and swelling, also unable to complete mammogram d/t pain.  Per Dr. Geralyn Flash recommendations Norco 5/325mg  take 1 tablet by mouth q 6 hours as needed for moderate pain.  Dis #60.  Patient aware that RX is available for pick up.  Hard script placed in scrip book in triage.  Once patient has pain controlled she will call to have mammogram re scheduled.  No further needs at this time, patient will call back to report update per patient.

## 2017-12-04 DIAGNOSIS — M797 Fibromyalgia: Secondary | ICD-10-CM | POA: Diagnosis not present

## 2017-12-04 DIAGNOSIS — I10 Essential (primary) hypertension: Secondary | ICD-10-CM | POA: Diagnosis not present

## 2017-12-04 DIAGNOSIS — M79672 Pain in left foot: Secondary | ICD-10-CM | POA: Diagnosis not present

## 2017-12-04 DIAGNOSIS — F329 Major depressive disorder, single episode, unspecified: Secondary | ICD-10-CM | POA: Diagnosis not present

## 2017-12-06 ENCOUNTER — Encounter (HOSPITAL_COMMUNITY): Payer: Self-pay

## 2017-12-06 ENCOUNTER — Emergency Department (HOSPITAL_COMMUNITY)
Admission: EM | Admit: 2017-12-06 | Discharge: 2017-12-06 | Disposition: A | Discharge status: Home / Self Care | Payer: PPO | Attending: Emergency Medicine | Admitting: Emergency Medicine

## 2017-12-06 ENCOUNTER — Emergency Department (HOSPITAL_COMMUNITY): Payer: PPO

## 2017-12-06 DIAGNOSIS — R9431 Abnormal electrocardiogram [ECG] [EKG]: Secondary | ICD-10-CM | POA: Diagnosis not present

## 2017-12-06 DIAGNOSIS — N644 Mastodynia: Secondary | ICD-10-CM | POA: Diagnosis not present

## 2017-12-06 DIAGNOSIS — R079 Chest pain, unspecified: Secondary | ICD-10-CM | POA: Insufficient documentation

## 2017-12-06 DIAGNOSIS — R0789 Other chest pain: Secondary | ICD-10-CM | POA: Diagnosis not present

## 2017-12-06 DIAGNOSIS — Z79899 Other long term (current) drug therapy: Secondary | ICD-10-CM | POA: Insufficient documentation

## 2017-12-06 DIAGNOSIS — R52 Pain, unspecified: Secondary | ICD-10-CM

## 2017-12-06 DIAGNOSIS — Z87891 Personal history of nicotine dependence: Secondary | ICD-10-CM | POA: Diagnosis not present

## 2017-12-06 DIAGNOSIS — R0602 Shortness of breath: Secondary | ICD-10-CM | POA: Diagnosis not present

## 2017-12-06 LAB — URINALYSIS, ROUTINE W REFLEX MICROSCOPIC
Bilirubin Urine: NEGATIVE
Glucose, UA: NEGATIVE mg/dL
Hgb urine dipstick: NEGATIVE
Ketones, ur: NEGATIVE mg/dL
Nitrite: NEGATIVE
Protein, ur: NEGATIVE mg/dL
Specific Gravity, Urine: 1.016 (ref 1.005–1.030)
pH: 6 (ref 5.0–8.0)

## 2017-12-06 LAB — BASIC METABOLIC PANEL
Anion gap: 9 (ref 5–15)
BUN: 10 mg/dL (ref 6–20)
CO2: 31 mmol/L (ref 22–32)
Calcium: 8.9 mg/dL (ref 8.9–10.3)
Chloride: 100 mmol/L (ref 98–111)
Creatinine, Ser: 0.69 mg/dL (ref 0.44–1.00)
GFR calc Af Amer: >60 mL/min (ref >60)
GFR calc non Af Amer: >60 mL/min (ref >60)
Glucose, Bld: 119 mg/dL — ABNORMAL HIGH (ref 70–99)
Potassium: 4.5 mmol/L (ref 3.5–5.1)
Sodium: 140 mmol/L (ref 135–145)

## 2017-12-06 LAB — CBC WITH DIFFERENTIAL/PLATELET
Basophils Absolute: 0 10*3/uL (ref 0.0–0.1)
Basophils Relative: 0 %
Eosinophils Absolute: 0.4 10*3/uL (ref 0.0–0.7)
Eosinophils Relative: 5 %
HCT: 35.4 % — ABNORMAL LOW (ref 36.0–46.0)
Hemoglobin: 10.3 g/dL — ABNORMAL LOW (ref 12.0–15.0)
Lymphocytes Relative: 35 %
Lymphs Abs: 3 10*3/uL (ref 0.7–4.0)
MCH: 24.2 pg — ABNORMAL LOW (ref 26.0–34.0)
MCHC: 29.1 g/dL — ABNORMAL LOW (ref 30.0–36.0)
MCV: 83.1 fL (ref 78.0–100.0)
Monocytes Absolute: 0.5 10*3/uL (ref 0.1–1.0)
Monocytes Relative: 6 %
Neutro Abs: 4.6 10*3/uL (ref 1.7–7.7)
Neutrophils Relative %: 54 %
Platelets: 375 10*3/uL (ref 150–400)
RBC: 4.26 MIL/uL (ref 3.87–5.11)
RDW: 16.2 % — ABNORMAL HIGH (ref 11.5–15.5)
WBC: 8.5 10*3/uL (ref 4.0–10.5)

## 2017-12-06 MED ORDER — MORPHINE SULFATE (PF) 4 MG/ML IV SOLN
4.0000 mg | Freq: Once | INTRAVENOUS | Status: AC
  Administered 2017-12-06: 4 mg via INTRAVENOUS
  Filled 2017-12-06: qty 1

## 2017-12-06 MED ORDER — ONDANSETRON HCL 4 MG/2ML IJ SOLN
4.0000 mg | Freq: Once | INTRAMUSCULAR | Status: AC
  Administered 2017-12-06: 4 mg via INTRAVENOUS
  Filled 2017-12-06: qty 2

## 2017-12-06 MED ORDER — SODIUM CHLORIDE 0.9 % IV BOLUS
1000.0000 mL | Freq: Once | INTRAVENOUS | Status: AC
  Administered 2017-12-06: 1000 mL via INTRAVENOUS

## 2017-12-06 MED ORDER — IOPAMIDOL (ISOVUE-370) INJECTION 76%
INTRAVENOUS | Status: AC
  Filled 2017-12-06: qty 100

## 2017-12-06 MED ORDER — IOPAMIDOL (ISOVUE-370) INJECTION 76%
100.0000 mL | Freq: Once | INTRAVENOUS | Status: AC | PRN
  Administered 2017-12-06: 76 mL via INTRAVENOUS

## 2017-12-06 MED ORDER — OXYCODONE-ACETAMINOPHEN 5-325 MG PO TABS: 1.0000 | ORAL_TABLET | Freq: Four times a day (QID) | ORAL | 0 refills | Status: DC | PRN

## 2017-12-06 MED ORDER — OXYCODONE-ACETAMINOPHEN 5-325 MG PO TABS
1.0000 | ORAL_TABLET | ORAL | Status: DC | PRN
  Administered 2017-12-06: 1 via ORAL
  Filled 2017-12-06: qty 1

## 2017-12-06 MED ORDER — ALBUTEROL SULFATE (2.5 MG/3ML) 0.083% IN NEBU
5.0000 mg | INHALATION_SOLUTION | Freq: Once | RESPIRATORY_TRACT | Status: AC
  Administered 2017-12-06: 5 mg via RESPIRATORY_TRACT
  Filled 2017-12-06: qty 6

## 2017-12-06 NOTE — ED Provider Notes (Signed)
[facility] DEPT Provider Note   CSN: [id number] Arrival date & time: 12/06/17  0301     History   Chief Complaint Chief Complaint  Patient presents with  . Breast Pain    HPI Ashley Lindsey is a 57 y.o. female.  HPI Patient presents to the emergency department with left-sided breast pain that is been ongoing over the last few months but feels like it is worse.  Patient states she is also having foot pain along with some vaginal discomfort from time to time.  She states these been ongoing for quite a while as well.  Patient states that nothing seems to make the condition better but palpation of the left breast area makes the pain worse.  The patient denies , shortness of breath, headache,blurred vision, neck pain, fever, cough, weakness, numbness, dizziness, anorexia, edema, abdominal pain, nausea, vomiting, diarrhea, rash, back pain, dysuria, hematemesis, bloody stool, near syncope, or syncope. Past Medical History:  Diagnosis Date  . Abdominal cramps   . Arthritis   . Cancer [facility])    s/p radiation- last dose 6/12//has been off Tamoxifen 8/12  . Chronic headaches   . Costochondritis   . Fatigue   . Fibromyalgia 03/06/2013  . Lymphedema    RIGHT ARM-////  STATES USE LEFT ARM FOR BP'S  . Migraines   . Muscle spasms of head and/or neck    following to the breast  . Passed out [facility])    4th of july  . Personal history of radiation therapy 2010   lt breast   . S/P radiation therapy 08/19/07 - 10/10/07   Left Breast/5040 cGy/28 fractions with Boost for a toatl dose of 6300 dGy  . S/P radiation therapy 08/14/10 -10/03/10   right breast  . Sleep apnea    STOP BANG SCORE 5    Patient Active Problem List   Diagnosis Date Noted  . Breast cancer of lower-outer quadrant of right female breast ([place]) 12/02/2014  . Influenzal bronchitis 09/22/2014  . Influenza B 09/22/2014  . Bronchospasm 09/21/2014  . Acute bronchitis 09/21/2014  . Hypoxia 09/21/2014    . Dyspnea 09/21/2014  . Leg pain 09/21/2014  . Fibromyalgia 03/06/2013  . Dense breasts 03/06/2013  . Mastalgia 03/06/2013  . Back pain, lumbosacral 03/06/2013  . Lymphedema of arm 03/06/2013  . Atypical chest pain 01/06/2013  . Breast cancer of upper-inner quadrant of left female breast ([place]) 09/08/2012  . Family history of malignant neoplasm of breast 09/08/2012  . S/P radiation therapy   . History of breast cancer in female 08/13/2011    Past Surgical History:  Procedure Laterality Date  . ABDOMINAL HYSTERECTOMY     with left salpingooophorectomy  . BREAST LUMPECTOMY  2009/2012   LEFT/RIGHT with lymph node dissection  . KNEE ARTHROSCOPY     right  . OOPHORECTOMY  2013     OB History   None      Home Medications    Prior to Admission medications   Medication Sig Start Date End Date Taking? Authorizing Provider  albuterol (PROAIR HFA) 108 (90 Base) MCG/ACT inhaler Inhale 1-2 puffs into the lungs every 6 (six) hours as needed for wheezing or shortness of breath. 09/20/17  Yes Tanner, Lyndon Code., PA-C  Bisacodyl (DULCOLAX PO) Take 1 capsule by mouth daily as needed (constipation).    Yes [provider]  cholecalciferol 5000 units TABS Take 1 tablet (5,000 Units total) by mouth 2 (two) times daily. Patient taking differently: Take 5,000  Units by mouth daily.  07/21/15  Yes Nicholas Lose, MD  cyclobenzaprine (FLEXERIL) 10 MG tablet Take 10 mg by mouth 3 (three) times daily as needed for muscle spasms.    Yes [provider]  fish oil-omega-3 fatty acids 1000 MG capsule Take 1 g by mouth daily.    Yes [provider]  Flaxseed, Linseed, (FLAXSEED OIL MAX STR PO) Take 1 capsule by mouth daily.    Yes [provider]  HYDROcodone-acetaminophen (NORCO) 5-325 MG tablet Take 1 tablet by mouth every 6 (six) hours as needed for moderate pain. 11/26/17  Yes Nicholas Lose, MD  ibuprofen (ADVIL,MOTRIN) 800 MG tablet Take 800 mg by mouth every 8 (eight)  hours as needed for moderate pain. For pain   Yes [provider]  lisinopril (PRINIVIL,ZESTRIL) 10 MG tablet Take 10 mg by mouth daily.   Yes [provider]  Multiple Vitamins-Minerals (MULTIVITAMIN & MINERAL PO) Take 1 tablet by mouth daily.   Yes [provider]  propranolol (INDERAL) 20 MG tablet Take 20 mg by mouth daily. 09/10/16  Yes [provider]  senna (SENOKOT) 8.6 MG tablet Take 1 tablet by mouth daily as needed for constipation.    Yes [provider]  topiramate (TOPAMAX) 25 MG tablet Take 1 tablet by mouth daily. 12/04/17  Yes [provider]  venlafaxine XR (EFFEXOR-XR) 150 MG 24 hr capsule TAKE 1 CAPSULE BY MOUTH EVERYDAY AT BEDTIME 11/26/17  Yes Nicholas Lose, MD  venlafaxine XR (EFFEXOR-XR) 75 MG 24 hr capsule TAKE 1 CAPSULE DAILY ALONG WITH 150MG  CAP TO MAKE IT 225MG  TOTAL 11/26/17  Yes Nicholas Lose, MD  vitamin E 200 UNIT capsule Take 200 Units by mouth daily.   Yes [provider]  doxycycline (VIBRA-TABS) 100 MG tablet Take 1 tablet (100 mg total) by mouth 2 (two) times daily. Patient not taking: Reported on 12/06/2017 09/20/17   Harle Stanford., PA-C  oxyCODONE-acetaminophen (PERCOCET/ROXICET) 5-325 MG tablet Take 1 tablet by mouth every 6 (six) hours as needed for severe pain. 12/06/17   Guinevere Stephenson, Harrell Gave, PA-C  pregabalin (LYRICA) 75 MG capsule Take 1 capsule (75 mg total) by mouth 2 (two) times daily. Patient not taking: Reported on 09/20/2017 03/14/12   Eston Esters, MD  propranolol ER (INDERAL LA) 80 MG 24 hr capsule Take 1 capsule (80 mg total) by mouth daily. Patient not taking: Reported on 10/21/2017 05/16/15   Nicholas Lose, MD  triamcinolone cream (KENALOG) 0.1 % Apply 1 application topically 2 (two) times daily. Apply for up to one week. Patient not taking: Reported on 12/06/2017 12/29/16   Vanessa Kick, MD  UNABLE TO FIND Rx: L8000-Post Surgical Bra (Quantity: 6) C5852- Silicone Breast Prosthesis (Quantity:  2) Dx: 174.9; Bilateral partial mastectomy Replacement due to hot flashes since previous items were too heavy and hot 01/20/13   Fanny Skates, MD    Family History Family History  Problem Relation Age of Onset  . Breast cancer Mother 44  . Heart disease Father   . Prostate cancer Maternal Uncle 78  . Cancer Maternal Aunt 63       d. from "female cancer" possibly cervical  . Prostate cancer Paternal Uncle   . Prostate cancer Maternal Grandfather 16  . Prostate cancer Maternal Uncle 80  . Prostate cancer Paternal Uncle   . Breast cancer Cousin 40       mat 1st cousin    Social History Social History   Tobacco Use  . Smoking status: Former  Smoker    Packs/day: 0.25    Years: 1.00    Pack years: 0.25    Types: Cigarettes  . Smokeless tobacco: Never Used  Substance Use Topics  . Alcohol use: No    Comment: 3 x a week  . Drug use: No     Allergies   Aspirin and Penicillins   Review of Systems Review of Systems All other systems negative except as documented in the HPI. All pertinent positives and negatives as reviewed in the HPI.  Physical Exam Updated Vital Signs BP 130/84   Pulse 100   Temp 98.6 F (37 C) (Oral)   Resp 18   Ht 5\' 2"  (1.575 m)   Wt 126.6 kg   SpO2 (!) 88%   BMI 51.03 kg/m   Physical Exam  Constitutional: She is oriented to person, place, and time. She appears well-developed and well-nourished. No distress.  HENT:  Head: Normocephalic and atraumatic.  Mouth/Throat: Oropharynx is clear and moist.  Eyes: Pupils are equal, round, and reactive to light.  Neck: Normal range of motion. Neck supple.  Cardiovascular: Normal rate, regular rhythm and normal heart sounds. Exam reveals no gallop and no friction rub.  No murmur heard. Pulmonary/Chest: Effort normal and breath sounds normal. No respiratory distress. She has no wheezes. She exhibits tenderness.  Abdominal: Soft. Bowel sounds are normal. She exhibits no distension. There is no  tenderness.  Neurological: She is alert and oriented to person, place, and time. She exhibits normal muscle tone. Coordination normal.  Skin: Skin is warm and dry. Capillary refill takes less than 2 seconds. No rash noted. No erythema.  Psychiatric: She has a normal mood and affect. Her behavior is normal.  Nursing note and vitals reviewed.    ED Treatments / Results  Labs (all labs ordered are listed, but only abnormal results are displayed) Labs Reviewed  BASIC METABOLIC PANEL - Abnormal; Notable for the following components:      Result Value   Glucose, Bld 119 (*)    All other components within normal limits  CBC WITH DIFFERENTIAL/PLATELET - Abnormal; Notable for the following components:   Hemoglobin 10.3 (*)    HCT 35.4 (*)    MCH 24.2 (*)    MCHC 29.1 (*)    RDW 16.2 (*)    All other components within normal limits  URINALYSIS, ROUTINE W REFLEX MICROSCOPIC - Abnormal; Notable for the following components:   Leukocytes, UA SMALL (*)    Bacteria, UA RARE (*)    All other components within normal limits    EKG EKG Interpretation  Date/Time:  Friday December 06 2017 03:11:15 EDT Ventricular Rate:  93 PR Interval:    QRS Duration: 88 QT Interval:  372 QTC Calculation: 463 R Axis:   28 Text Interpretation:  Sinus rhythm Consider left atrial enlargement Abnormal R-wave progression, early transition No acute changes No significant change since last tracing Confirmed by Varney Biles [phone]) on 12/06/2017 8:29:33 AM   Radiology Dg Chest 2 View  Result Date: 12/06/2017 CLINICAL DATA:  Initial evaluation for acute breast pain. EXAM: CHEST - 2 VIEW COMPARISON:  Prior radiograph from 05/07/2017. FINDINGS: The cardiac and mediastinal silhouettes are stable in size and contour, and remain within normal limits. The lungs are mildly hypoinflated. No airspace consolidation, pleural effusion, or pulmonary edema is identified. There is no pneumothorax. No acute osseous abnormality  identified. IMPRESSION: No active cardiopulmonary disease. Electronically Signed   By: Jeannine Boga M.D.   On: 12/06/2017  04:18   Ct Angio Chest Pe W/cm &/or Wo Cm  Result Date: 12/06/2017 CLINICAL DATA:  Shortness of breath.  Left chest pain EXAM: CT ANGIOGRAPHY CHEST WITH CONTRAST TECHNIQUE: Multidetector CT imaging of the chest was performed using the standard protocol during bolus administration of intravenous contrast. Multiplanar CT image reconstructions and MIPs were obtained to evaluate the vascular anatomy. CONTRAST:  78mL ISOVUE-370 IOPAMIDOL (ISOVUE-370) INJECTION 76% COMPARISON:  01/06/2013 FINDINGS: Cardiovascular: No filling defects in the pulmonary arteries to suggest pulmonary emboli. Heart is borderline in size. Aorta normal caliber. Mediastinum/Nodes: No mediastinal, hilar, or axillary adenopathy. Lungs/Pleura: 4 mm nodule in the anterior right upper lobe. No confluent airspace opacities or effusions. Upper Abdomen: Fatty infiltration of the liver. No acute findings. Musculoskeletal: Chest wall soft tissues are unremarkable. No acute bony abnormality. Review of the MIP images confirms the above findings. IMPRESSION: No evidence of pulmonary embolus. Borderline heart size. 4 mm right upper lobe pulmonary nodule. No follow-up needed if patient is low-risk. Non-contrast chest CT can be considered in 12 months if patient is high-risk. This recommendation follows the consensus statement: Guidelines for Management of Incidental Pulmonary Nodules Detected on CT Images: From the Fleischner Society 2017; Radiology 2017; 284:228-243. Fatty infiltration of the liver. Electronically Signed   By: Rolm Baptise M.D.   On: 12/06/2017 09:02    Procedures Procedures (including critical care time)  Medications Ordered in ED Medications  albuterol (PROVENTIL) (2.5 MG/3ML) 0.083% nebulizer solution 5 mg (5 mg Nebulization Given 12/06/17 0340)  sodium chloride 0.9 % bolus 1,000 mL (0 mLs Intravenous  Stopped 12/06/17 0839)  ondansetron (ZOFRAN) injection 4 mg (4 mg Intravenous Given 12/06/17 0705)  iopamidol (ISOVUE-370) 76 % injection 100 mL (76 mLs Intravenous Contrast Given 12/06/17 0844)  morphine 4 MG/ML injection 4 mg (4 mg Intravenous Given 12/06/17 1019)     Initial Impression / Assessment and Plan / ED Course  I have reviewed the triage vital signs and the nursing notes.  Pertinent labs & imaging results that were available during my care of the patient were reviewed by me and considered in my medical decision making (see chart for details).     Patient had a CTA done due to the fact that she has a cancer history along with breast cancer and with this breast pain and some intermittent shortness of breath I did scan her chest which did not show any PE or pneumonia.  The patient is referred back to her oncologist.  I did make her aware of a right upper lobe nodule that is present on the CT scan.  The patient agrees to the plan and all questions were answered.  I do feel that there is some chronic components of these complaints in her chest pain due to the fact that she had radiation and surgery on the breast.  Final Clinical Impressions(s) / ED Diagnoses   Final diagnoses:  Breast pain  Pain    ED Discharge Orders         Ordered    oxyCODONE-acetaminophen (PERCOCET/ROXICET) 5-325 MG tablet  Every 6 hours PRN     12/06/17 [address], PA-C 12/06/17 [place], Ankit, MD 12/06/17 1606

## 2017-12-06 NOTE — Discharge Instructions (Signed)
Return here as needed.  Follow-up closely with your oncologist.  There was a nodule seen on the CT scan in the right upper lung that needs follow-up.

## 2017-12-06 NOTE — ED Notes (Signed)
Pt was given three blankets to prop under her knees

## 2017-12-06 NOTE — ED Triage Notes (Signed)
Pt complains of pain under her left breast, pt has breast cancer and has had surgery on both, pt states she's had pain on and off ion the same area before. She states this time it feels like the area is harder

## 2017-12-06 NOTE — ED Notes (Signed)
Pt requested ice to eat and ice was given

## 2017-12-06 NOTE — ED Triage Notes (Signed)
Pt also complains of being short of breath and she doesn't have her inhaler

## 2017-12-19 ENCOUNTER — Ambulatory Visit
Admission: RE | Admit: 2017-12-19 | Discharge: 2017-12-19 | Disposition: A | Discharge status: Home / Self Care | Payer: PPO | Source: Ambulatory Visit | Attending: Medical | Admitting: Medical

## 2017-12-19 ENCOUNTER — Ambulatory Visit: Payer: PPO

## 2017-12-19 DIAGNOSIS — N644 Mastodynia: Secondary | ICD-10-CM

## 2017-12-19 DIAGNOSIS — R922 Inconclusive mammogram: Secondary | ICD-10-CM | POA: Diagnosis not present

## 2017-12-19 DIAGNOSIS — Z803 Family history of malignant neoplasm of breast: Secondary | ICD-10-CM | POA: Diagnosis not present

## 2017-12-26 ENCOUNTER — Ambulatory Visit: Payer: PPO | Admitting: Podiatry

## 2017-12-31 ENCOUNTER — Other Ambulatory Visit: Payer: Self-pay

## 2017-12-31 MED ORDER — HYDROCODONE-ACETAMINOPHEN 5-325 MG PO TABS: 1.0000 | ORAL_TABLET | Freq: Four times a day (QID) | ORAL | 0 refills | Status: DC | PRN

## 2017-12-31 NOTE — Telephone Encounter (Signed)
Pt called to follow up and check on why she has not received her lymphedema sleeves yet. She was at the cancer rehab a few weeks ago and was fitted for sleeves. She hasn't heard anything about the status of her sleeves. She is in pain with L arm and breast swelling. She would also like to request refill on her pain medication for her breast pain, during lymphedema exacerbation. Told pt that she will need to come to the cancer center and pick up the prescription. Pt verbalized understanding. Will contact the cancer rehab and follow up on the status of her sleeves and update pt.

## 2018-01-01 ENCOUNTER — Telehealth: Payer: Self-pay

## 2018-01-01 ENCOUNTER — Ambulatory Visit: Payer: PPO | Admitting: Podiatry

## 2018-01-01 NOTE — Telephone Encounter (Signed)
Spoke with pt today to notify her that the cancer rehab will attempt to contact the provider of the sleeve and call her with updates today. Pt may also come in today and pick up her prescription. Pt very appreciative of call and has no further questions at this time.

## 2018-01-06 NOTE — Assessment & Plan Note (Deleted)
Left breast invasive ductal carcinoma grade 1 ER 77% PR 96% Ki 67: 5% HER-2 negative, T1 AN0M0 stage I A. status post lumpectomy, followed by radiation therapy completed 10/10/2007, tamoxifen started September 2009 discontinued March 2012 followed by recurrence in the right breast as DCIS 05/26/2010 completed another lumpectomy and radiation therapy and she has been on tamoxifen completed 10/2015  Breast Cancer Surveillance: 1. Breast exam 01/06/2018: Normal, fibromyalgia causing breast tenderness. There are no nodules or lumps to warrant any additional investigation at this time. 2. Mammogram 12/19/2017 No abnormalities. Postsurgical changes. Breast Density Category C.   Return to clinic in 1 year for follow-up

## 2018-01-07 ENCOUNTER — Inpatient Hospital Stay: Payer: PPO | Attending: Hematology and Oncology | Admitting: Hematology and Oncology

## 2018-01-09 DIAGNOSIS — Z1331 Encounter for screening for depression: Secondary | ICD-10-CM | POA: Diagnosis not present

## 2018-01-09 DIAGNOSIS — J209 Acute bronchitis, unspecified: Secondary | ICD-10-CM | POA: Diagnosis not present

## 2018-01-15 ENCOUNTER — Ambulatory Visit: Payer: PPO | Admitting: Podiatry

## 2018-01-22 ENCOUNTER — Other Ambulatory Visit: Payer: Self-pay | Admitting: Nurse Practitioner

## 2018-01-27 ENCOUNTER — Telehealth: Payer: Self-pay

## 2018-01-27 NOTE — Telephone Encounter (Addendum)
Pt called to report increasing pain and tenderness on her R breast. Pt hx of breast cancer with bilateral lumpectomy. Pt states that she has been experiencing a dull aching pain x 2 weeks without relief, that worsens at night. She wears a sleeve on her L arm, but not on the R arm. She describes some swelling and heat under her R lumpectomy site, and across the bottom of her breast area. She takes percocet as needed for pain control, but wants to be seen, prior to further testing. Scheduled pt for this week, to see MD. Pt verified time/date of appt. No further needs at this time.

## 2018-01-28 ENCOUNTER — Other Ambulatory Visit: Payer: Self-pay | Admitting: Nurse Practitioner

## 2018-01-29 ENCOUNTER — Telehealth: Payer: Self-pay

## 2018-01-29 NOTE — Telephone Encounter (Signed)
Pt called stating she wants a referral to [facility] on [address]. Pt was advised that we have did a referral for her to them earlier this year. Pt stated she cant go to Dr.Devinshawar. We told pt [name] FNP-BC will put in another referral pt was advised to keep her appointment when they schedule it. YRL,RMA

## 2018-01-29 NOTE — Assessment & Plan Note (Signed)
Left breast invasive ductal carcinoma grade 1 ER 77% PR 96% gases on 5% HER-2 negative, T1 AN0M0 stage I A. status post lumpectomy 2.7 2009 followed by radiation therapy completed 10/10/2007, tamoxifen started September 2009 discontinued March 2012 followed by recurrence in the right breast as DCIS 05/26/2010 completed another lumpectomy and radiation therapy and she has been on tamoxifen ever since.  Insufficient tissue for BCI Completed Tamoxifen July 2017  Breast Cancer Surveillance: 1. Breast exam 01/29/18: Normal, fibromyalgia causing breast tenderness. There are no nodules or lumps to warrant any additional investigation at this time. 2. Mammogram 12/19/17 No abnormalities. Postsurgical changes. Breast Density Category C.   Return to clinic in 1 year for follow-up

## 2018-01-30 ENCOUNTER — Telehealth: Payer: Self-pay | Admitting: Hematology and Oncology

## 2018-01-30 ENCOUNTER — Inpatient Hospital Stay: Payer: PPO | Attending: Hematology and Oncology | Admitting: Hematology and Oncology

## 2018-01-30 DIAGNOSIS — N644 Mastodynia: Secondary | ICD-10-CM

## 2018-01-30 DIAGNOSIS — Z170 Estrogen receptor positive status [ER+]: Secondary | ICD-10-CM

## 2018-01-30 DIAGNOSIS — M797 Fibromyalgia: Secondary | ICD-10-CM | POA: Insufficient documentation

## 2018-01-30 DIAGNOSIS — C50212 Malignant neoplasm of upper-inner quadrant of left female breast: Secondary | ICD-10-CM | POA: Insufficient documentation

## 2018-01-30 DIAGNOSIS — Z79899 Other long term (current) drug therapy: Secondary | ICD-10-CM | POA: Diagnosis not present

## 2018-01-30 DIAGNOSIS — Z79810 Long term (current) use of selective estrogen receptor modulators (SERMs): Secondary | ICD-10-CM | POA: Insufficient documentation

## 2018-01-30 DIAGNOSIS — Z923 Personal history of irradiation: Secondary | ICD-10-CM | POA: Diagnosis not present

## 2018-01-30 MED ORDER — HYDROCODONE-ACETAMINOPHEN 5-325 MG PO TABS: 1.0000 | ORAL_TABLET | Freq: Four times a day (QID) | ORAL | 0 refills | Status: DC | PRN

## 2018-01-30 NOTE — Telephone Encounter (Signed)
Pt will call to make appt for next year

## 2018-01-30 NOTE — Progress Notes (Signed)
Patient Care Team: [name], MD as PCP - General (Internal Medicine)  DIAGNOSIS:  Encounter Diagnosis  Name Primary?  . Malignant neoplasm of upper-inner quadrant of left breast in female, estrogen receptor positive ([place])     SUMMARY OF ONCOLOGIC HISTORY:   Breast cancer of upper-inner quadrant of left female breast ([place])   05/27/2007 Initial Diagnosis    Left breast biopsy: DCIS with calcifications ER/PR 100% positive, MRI revealed 1.7 x 1.4 x 0.9 cm DCIS at 12:00 position left breast    06/20/2007 Surgery    Left breast lumpectomy: T1 N0 M0 stage IA invasive ductal carcinoma 0.4-0.5 cm grade 1, ER 77%, PR 96%, Ki-67 less than 5%, HER-2 negative    08/21/2007 - 10/10/2007 Radiation Therapy    Adjuvant radiation therapy    01/14/2008 - 01/07/2017 Anti-estrogen oral therapy    Tamoxifen 20 mg daily     Breast cancer of lower-outer quadrant of right female breast ([place])   05/26/2010 Initial Diagnosis    DCIS ER/PR positive    06/16/2010 Surgery    No residual DCIS, extensive fibrocystic changes with focal atypical ductal hyperplasia    08/02/2010 - 09/22/2010 Radiation Therapy    Adjuvant radiation    10/26/2010 - 10/31/2015 Anti-estrogen oral therapy    Tamoxifen 20 mg daily     CHIEF COMPLIANT: Follow-up of bilateral DCIS  INTERVAL HISTORY: Ashley Lindsey is a 57 year old with above-mentioned history of bilateral DCIS who underwent lumpectomy followed by radiation and took 5 years of tamoxifen which was completed in 2017.  She is here for annual checkup.  She is complaining of worsening pain in the right breast.  She has chronic fibromyalgia and that sometimes flares up causing pain in the breast as well.  REVIEW OF SYSTEMS:   Constitutional: Denies fevers, chills or abnormal weight loss Eyes: Denies blurriness of vision Ears, nose, mouth, throat, and face: Denies mucositis or sore throat Respiratory: Denies cough, dyspnea or wheezes Cardiovascular: Denies palpitation, chest  discomfort Gastrointestinal:  Denies nausea, heartburn or change in bowel habits Skin: Denies abnormal skin rashes Lymphatics: Denies new lymphadenopathy or easy bruising Neurological:Denies numbness, tingling or new weaknesses Behavioral/Psych: Mood is stable, no new changes  Extremities: No lower extremity edema Breast: Right breast pain All other systems were reviewed with the patient and are negative.  I have reviewed the past medical history, past surgical history, social history and family history with the patient and they are unchanged from previous note.  ALLERGIES:  is allergic to aspirin and penicillins.  MEDICATIONS:  Current Outpatient Medications  Medication Sig Dispense Refill  . albuterol (PROAIR HFA) 108 (90 Base) MCG/ACT inhaler Inhale 1-2 puffs into the lungs every 6 (six) hours as needed for wheezing or shortness of breath. 1 Inhaler 1  . Bisacodyl (DULCOLAX PO) Take 1 capsule by mouth daily as needed (constipation).     . cholecalciferol 5000 units TABS Take 1 tablet (5,000 Units total) by mouth 2 (two) times daily. (Patient taking differently: Take 5,000 Units by mouth daily. )    . cyclobenzaprine (FLEXERIL) 10 MG tablet Take 10 mg by mouth 3 (three) times daily as needed for muscle spasms.     Marland Kitchen doxycycline (VIBRA-TABS) 100 MG tablet Take 1 tablet (100 mg total) by mouth 2 (two) times daily. (Patient not taking: Reported on 12/06/2017) 14 tablet 0  . fish oil-omega-3 fatty acids 1000 MG capsule Take 1 g by mouth daily.     . Flaxseed, Linseed, (FLAXSEED OIL MAX  STR PO) Take 1 capsule by mouth daily.     Marland Kitchen ibuprofen (ADVIL,MOTRIN) 800 MG tablet Take 800 mg by mouth every 8 (eight) hours as needed for moderate pain. For pain    . Multiple Vitamins-Minerals (MULTIVITAMIN & MINERAL PO) Take 1 tablet by mouth daily.    . propranolol (INDERAL) 20 MG tablet Take 20 mg by mouth daily.  3  . propranolol ER (INDERAL LA) 80 MG 24 hr capsule Take 1 capsule (80 mg total) by mouth  daily. (Patient not taking: Reported on 10/21/2017)    . senna (SENOKOT) 8.6 MG tablet Take 1 tablet by mouth daily as needed for constipation.     . topiramate (TOPAMAX) 25 MG tablet Take 1 tablet by mouth daily.    Marland Kitchen triamcinolone cream (KENALOG) 0.1 % Apply 1 application topically 2 (two) times daily. Apply for up to one week. (Patient not taking: Reported on 12/06/2017) 30 g 0  . UNABLE TO FIND Rx: L8000-Post Surgical Bra (Quantity: 6) I0165- Silicone Breast Prosthesis (Quantity: 2) Dx: 174.9; Bilateral partial mastectomy Replacement due to hot flashes since previous items were too heavy and hot 1 each 0  . venlafaxine XR (EFFEXOR-XR) 150 MG 24 hr capsule TAKE 1 CAPSULE BY MOUTH EVERYDAY AT BEDTIME 90 capsule 0  . venlafaxine XR (EFFEXOR-XR) 75 MG 24 hr capsule TAKE 1 CAPSULE DAILY ALONG WITH 150MG CAP TO MAKE IT 225MG TOTAL 90 capsule 0  . vitamin E 200 UNIT capsule Take 200 Units by mouth daily.     No current facility-administered medications for this visit.     PHYSICAL EXAMINATION: ECOG PERFORMANCE STATUS: 1 - Symptomatic but completely ambulatory  Vitals:   01/30/18 0914  BP: 128/84  Pulse: 84  Resp: 16  Temp: 98.9 F (37.2 C)  SpO2: 100%   Filed Weights   01/30/18 0914  Weight: 287 lb 11.2 oz (130.5 kg)    GENERAL:alert, no distress and comfortable SKIN: skin color, texture, turgor are normal, no rashes or significant lesions EYES: normal, Conjunctiva are pink and non-injected, sclera clear OROPHARYNX:no exudate, no erythema and lips, buccal mucosa, and tongue normal  NECK: supple, thyroid normal size, non-tender, without nodularity LYMPH:  no palpable lymphadenopathy in the cervical, axillary or inguinal LUNGS: clear to auscultation and percussion with normal breathing effort HEART: regular rate & rhythm and no murmurs and no lower extremity edema ABDOMEN:abdomen soft, non-tender and normal bowel sounds MUSCULOSKELETAL:no cyanosis of digits and no clubbing  NEURO:  alert & oriented x 3 with fluent speech, no focal motor/sensory deficits EXTREMITIES: No lower extremity edema BREAST: No palpable masses or nodules in either right or left breasts.  She is very tender to palpation especially in the right breast.  No palpable axillary supraclavicular or infraclavicular adenopathy no breast tenderness or nipple discharge. (exam performed in the presence of a chaperone)  LABORATORY DATA:  I have reviewed the data as listed CMP Latest Ref Rng & Units 12/06/2017 01/07/2017 09/16/2015  Glucose 70 - 99 mg/dL 119(H) 111 137(H)  BUN 6 - 20 mg/dL 10 8.7 13  Creatinine 0.44 - 1.00 mg/dL 0.69 0.8 0.70  Sodium 135 - 145 mmol/L 140 139 137  Potassium 3.5 - 5.1 mmol/L 4.5 4.1 3.8  Chloride 98 - 111 mmol/L 100 - 107  CO2 22 - 32 mmol/L 31 25 25   Calcium 8.9 - 10.3 mg/dL 8.9 8.9 8.1(L)  Total Protein 6.4 - 8.3 g/dL - 7.0 -  Total Bilirubin 0.20 - 1.20 mg/dL - 0.22 -  Alkaline Phos 40 - 150 U/L - 73 -  AST 5 - 34 U/L - 47(H) -  ALT 0 - 55 U/L - 54 -    Lab Results  Component Value Date   WBC 8.5 12/06/2017   HGB 10.3 (L) 12/06/2017   HCT 35.4 (L) 12/06/2017   MCV 83.1 12/06/2017   PLT 375 12/06/2017   NEUTROABS 4.6 12/06/2017    ASSESSMENT & PLAN:  Breast cancer of upper-inner quadrant of left female breast ([place]) Left breast invasive ductal carcinoma grade 1 ER 77% PR 96% gases on 5% HER-2 negative, T1 AN0M0 stage I A. status post lumpectomy 2.7 2009 followed by radiation therapy completed 10/10/2007, tamoxifen started September 2009 discontinued March 2012 followed by recurrence in the right breast as DCIS 05/26/2010 completed another lumpectomy and radiation therapy and she has been on tamoxifen ever since.  Insufficient tissue for BCI Completed Tamoxifen July 2017  Breast Cancer Surveillance: 1. Breast exam 01/29/18: Normal, fibromyalgia causing breast tenderness. There are no nodules or lumps to warrant any additional investigation at this time. 2. Mammogram  12/19/17 No abnormalities. Postsurgical changes. Breast Density Category C.   Severe fibromyalgia related pains: She is seeing a fibromyalgia specialist.  We will renew her pain medication one more time and then we will not be involved in her pain management.  I instructed her to follow the instructions of the fibromyalgia specialist as well as her rheumatologist regarding pain issues.  Her pain is not coming from breast cancer history.  Return to clinic in 1 year for follow-up with long-term survivorship clinic.   No orders of the defined types were placed in this encounter.  The patient has a good understanding of the overall plan. she agrees with it. she will call with any problems that may develop before the next visit here.   Harriette Ohara, MD 01/30/18

## 2018-02-14 ENCOUNTER — Other Ambulatory Visit: Payer: Self-pay | Admitting: Nurse Practitioner

## 2018-02-22 ENCOUNTER — Other Ambulatory Visit: Payer: Self-pay | Admitting: Hematology and Oncology

## 2018-02-25 ENCOUNTER — Other Ambulatory Visit: Payer: Self-pay

## 2018-02-25 MED ORDER — HYDROCODONE-ACETAMINOPHEN 5-325 MG PO TABS: 1.0000 | ORAL_TABLET | Freq: Four times a day (QID) | ORAL | 0 refills | Status: DC | PRN

## 2018-02-25 NOTE — Progress Notes (Signed)
Pt would like to see if she can get a refill for her pain medication for R breast pain. Pt will not be seeing her fibromyalgia specialist and rheumatologist until the end of the month. Pt has having worsening pain in the breast. She wears her sleeves and gloves everyday. She states that sometimes, it does radiate to her back. Per MD, okay to refill pain medication today. Pt will be picking up medication this afternoon.

## 2018-03-03 ENCOUNTER — Inpatient Hospital Stay: Payer: PPO | Attending: Hematology and Oncology

## 2018-03-03 ENCOUNTER — Inpatient Hospital Stay (HOSPITAL_BASED_OUTPATIENT_CLINIC_OR_DEPARTMENT_OTHER): Payer: PPO | Admitting: Medical

## 2018-03-03 ENCOUNTER — Telehealth: Payer: Self-pay | Admitting: Medical

## 2018-03-03 ENCOUNTER — Other Ambulatory Visit: Payer: Self-pay | Admitting: *Deleted

## 2018-03-03 ENCOUNTER — Telehealth: Payer: Self-pay | Admitting: *Deleted

## 2018-03-03 VITALS — BP 166/91 | HR 102 | Temp 98.0°F | Resp 18 | Ht 62.0 in | Wt 289.2 lb

## 2018-03-03 DIAGNOSIS — R5383 Other fatigue: Secondary | ICD-10-CM

## 2018-03-03 DIAGNOSIS — Z853 Personal history of malignant neoplasm of breast: Secondary | ICD-10-CM

## 2018-03-03 DIAGNOSIS — L03113 Cellulitis of right upper limb: Secondary | ICD-10-CM

## 2018-03-03 DIAGNOSIS — Z170 Estrogen receptor positive status [ER+]: Secondary | ICD-10-CM

## 2018-03-03 DIAGNOSIS — Z923 Personal history of irradiation: Secondary | ICD-10-CM | POA: Diagnosis not present

## 2018-03-03 DIAGNOSIS — Z803 Family history of malignant neoplasm of breast: Secondary | ICD-10-CM | POA: Insufficient documentation

## 2018-03-03 DIAGNOSIS — M797 Fibromyalgia: Secondary | ICD-10-CM

## 2018-03-03 DIAGNOSIS — Z79810 Long term (current) use of selective estrogen receptor modulators (SERMs): Secondary | ICD-10-CM

## 2018-03-03 DIAGNOSIS — M25512 Pain in left shoulder: Secondary | ICD-10-CM | POA: Insufficient documentation

## 2018-03-03 DIAGNOSIS — G4730 Sleep apnea, unspecified: Secondary | ICD-10-CM | POA: Insufficient documentation

## 2018-03-03 DIAGNOSIS — M129 Arthropathy, unspecified: Secondary | ICD-10-CM | POA: Insufficient documentation

## 2018-03-03 DIAGNOSIS — C50212 Malignant neoplasm of upper-inner quadrant of left female breast: Secondary | ICD-10-CM

## 2018-03-03 DIAGNOSIS — M791 Myalgia, unspecified site: Secondary | ICD-10-CM

## 2018-03-03 DIAGNOSIS — Z87891 Personal history of nicotine dependence: Secondary | ICD-10-CM | POA: Insufficient documentation

## 2018-03-03 DIAGNOSIS — R110 Nausea: Secondary | ICD-10-CM

## 2018-03-03 DIAGNOSIS — M79621 Pain in right upper arm: Secondary | ICD-10-CM | POA: Insufficient documentation

## 2018-03-03 DIAGNOSIS — Z8041 Family history of malignant neoplasm of ovary: Secondary | ICD-10-CM

## 2018-03-03 DIAGNOSIS — D0511 Intraductal carcinoma in situ of right breast: Secondary | ICD-10-CM

## 2018-03-03 DIAGNOSIS — I890 Lymphedema, not elsewhere classified: Secondary | ICD-10-CM | POA: Diagnosis not present

## 2018-03-03 LAB — CMP (CANCER CENTER ONLY)
ALT: 25 U/L (ref 0–44)
AST: 26 U/L (ref 15–41)
Albumin: 3.5 g/dL (ref 3.5–5.0)
Alkaline Phosphatase: 93 U/L (ref 38–126)
Anion gap: 9 (ref 5–15)
BUN: 11 mg/dL (ref 6–20)
CO2: 27 mmol/L (ref 22–32)
Calcium: 9 mg/dL (ref 8.9–10.3)
Chloride: 104 mmol/L (ref 98–111)
Creatinine: 0.75 mg/dL (ref 0.44–1.00)
GFR, Est AFR Am: >60 mL/min (ref >60)
GFR, Estimated: >60 mL/min (ref >60)
Glucose, Bld: 109 mg/dL — ABNORMAL HIGH (ref 70–99)
Potassium: 3.8 mmol/L (ref 3.5–5.1)
Sodium: 140 mmol/L (ref 135–145)
Total Bilirubin: <0.2 mg/dL — ABNORMAL LOW (ref 0.3–1.2)
Total Protein: 7.5 g/dL (ref 6.5–8.1)

## 2018-03-03 LAB — CBC WITH DIFFERENTIAL (CANCER CENTER ONLY)
Abs Immature Granulocytes: 0.03 10*3/uL (ref 0.00–0.07)
Basophils Absolute: 0 10*3/uL (ref 0.0–0.1)
Basophils Relative: 0 %
Eosinophils Absolute: 0.3 10*3/uL (ref 0.0–0.5)
Eosinophils Relative: 3 %
HCT: 36 % (ref 36.0–46.0)
Hemoglobin: 10.4 g/dL — ABNORMAL LOW (ref 12.0–15.0)
Immature Granulocytes: 0 %
Lymphocytes Relative: 34 %
Lymphs Abs: 3.3 10*3/uL (ref 0.7–4.0)
MCH: 23.9 pg — ABNORMAL LOW (ref 26.0–34.0)
MCHC: 28.9 g/dL — ABNORMAL LOW (ref 30.0–36.0)
MCV: 82.8 fL (ref 80.0–100.0)
Monocytes Absolute: 0.8 10*3/uL (ref 0.1–1.0)
Monocytes Relative: 8 %
Neutro Abs: 5.4 10*3/uL (ref 1.7–7.7)
Neutrophils Relative %: 55 %
Platelet Count: 375 10*3/uL (ref 150–400)
RBC: 4.35 MIL/uL (ref 3.87–5.11)
RDW: 16.2 % — ABNORMAL HIGH (ref 11.5–15.5)
WBC Count: 9.8 10*3/uL (ref 4.0–10.5)
nRBC: 0 % (ref 0.0–0.2)

## 2018-03-03 MED ORDER — PROCHLORPERAZINE MALEATE 5 MG PO TABS: 5.0000 mg | ORAL_TABLET | Freq: Four times a day (QID) | ORAL | 0 refills | Status: DC | PRN

## 2018-03-03 MED ORDER — HYDROCODONE-ACETAMINOPHEN 5-325 MG PO TABS: 1.0000 | ORAL_TABLET | Freq: Four times a day (QID) | ORAL | 0 refills | Status: DC | PRN

## 2018-03-03 MED ORDER — PREDNISONE 5 MG PO TABS: ORAL_TABLET | ORAL | 0 refills | Status: DC

## 2018-03-03 NOTE — Progress Notes (Signed)
Pt presents with upper R arm pain x1 wk described as "burning, like my arm is being cooked".  Hx chronic arm pain d/t bilat lymphedema.  Denies recent injury.  States that the pain has caused swelling in R arm, R leg, and all along the R side of her body.  Denies recent injury.  Tender to touch but not warm or red.  Denies CP or SOB.  No rash or fever present.

## 2018-03-03 NOTE — Patient Instructions (Signed)

## 2018-03-03 NOTE — Telephone Encounter (Signed)
TC from patient. She states she has developed worsening lymphedema in her right arm and right breast with increasing pain and tenderness, warm to the touch. She has been using her lymphedema sleeve and also using a heating pad with some relief. She has hydrocodone @ home, again with temporary relief. She denies fevers but states she does have periodic chills. Suggested that she should be seen in [facility] today in case of cellulitis. She is agreeable to that.  High priority scheduling message sent for labs and [facility] for this afternoon.

## 2018-03-03 NOTE — Telephone Encounter (Signed)
Pt sched per 11/11 sch message. Pt aware

## 2018-03-05 ENCOUNTER — Other Ambulatory Visit: Payer: Self-pay

## 2018-03-05 ENCOUNTER — Telehealth: Payer: Self-pay

## 2018-03-05 ENCOUNTER — Other Ambulatory Visit: Payer: Self-pay | Admitting: Nurse Practitioner

## 2018-03-05 NOTE — Progress Notes (Signed)
Symptoms Management Clinic Progress Note   Ashley Lindsey [id number] Oct 29, 1960 57 y.o.  Ashley Lindsey is managed by Dr. Nicholas Lose  Actively treated with chemotherapy/immunotherapy: no  Assessment: Plan:    Muscle ache - Plan: HYDROcodone-acetaminophen (NORCO) 5-325 MG tablet, predniSONE (DELTASONE) 5 MG tablet  Nausea without vomiting - Plan: prochlorperazine (COMPAZINE) 5 MG tablet  History of breast cancer in female   Muscle aches versus exacerbation of fibromyalgia: Patient was given a prednisone taper and a prescription for Norco 5-325 every 6 hours as needed for pain, #15 with no refills.  Nausea with opioids: Patient was given a prescription for Compazine 5 mg every 6 hours as needed for nausea.  Breast cancer: The patient continues to be followed conservatively by Dr. Lindi Adie.  The patient has no follow-up appointment.  Please see After Visit Summary for patient specific instructions.  Future Appointments  Date Time Provider [place]  03/06/2018 10:30 AM [name], FNP [facility] None  07/24/2018  9:30 AM [facility] None  07/24/2018 10:00 AM [name], FNP [facility] None    No orders of the defined types were placed in this encounter.      Subjective:   Patient ID:  Ashley Lindsey is a 57 y.o. (DOB November 13, 1960) female.  Chief Complaint:  Chief Complaint  Patient presents with  . Arm Pain    HPI Ashley Lindsey is a 57 year old female with a diagnosis of an ER positive left breast cancer and an ER PR positive ductal carcinoma in situ of the right breast who is followed by Dr. Nicholas Lose and was last seen on 01/30/2018.  She was previously treated with a lumpectomy, adjuvant radiation, and tamoxifen.  The patient additionally has a history of fibromyalgia.  She reports having pain in her left medial right upper arm right and left shoulder along with bilateral lymphedema.  She denies any change in activity and denies trauma.  She denies  fevers, chills, or sweats.  Medications: I have reviewed the patient's current medications.  Allergies:  Allergies  Allergen Reactions  . Aspirin     unknown  . Penicillins Hives and Swelling    Has patient had a PCN reaction causing immediate rash, facial/tongue/throat swelling, SOB or lightheadedness with hypotension: Yes Has patient had a PCN reaction causing severe rash involving mucus membranes or skin necrosis: Yes Has patient had a PCN reaction that required hospitalization Yes Has patient had a PCN reaction occurring within the last 10 years: No If all of the above answers are "NO", then may proceed with Cephalosporin use.     Past Medical History:  Diagnosis Date  . Abdominal cramps   . Arthritis   . Cancer [facility])    s/p radiation- last dose 6/12//has been off Tamoxifen 8/12  . Chronic headaches   . Costochondritis   . Fatigue   . Fibromyalgia 03/06/2013  . Lymphedema    RIGHT ARM-////  STATES USE LEFT ARM FOR BP'S  . Migraines   . Muscle spasms of head and/or neck    following to the breast  . Passed out [facility])    4th of july  . Personal history of radiation therapy 2010   lt breast   . S/P radiation therapy 08/19/07 - 10/10/07   Left Breast/5040 cGy/28 fractions with Boost for a toatl dose of 6300 dGy  . S/P radiation therapy 08/14/10 -10/03/10   right breast  . Sleep apnea    STOP BANG SCORE 5  Past Surgical History:  Procedure Laterality Date  . ABDOMINAL HYSTERECTOMY     with left salpingooophorectomy  . BREAST LUMPECTOMY  2009/2012   LEFT/RIGHT with lymph node dissection  . KNEE ARTHROSCOPY     right  . OOPHORECTOMY  2013    Family History  Problem Relation Age of Onset  . Breast cancer Mother 89  . Heart disease Father   . Prostate cancer Maternal Uncle 78  . Cancer Maternal Aunt 63       d. from "female cancer" possibly cervical  . Breast cancer Maternal Aunt 60  . Prostate cancer Paternal Uncle   . Prostate cancer Maternal Grandfather 39    . Prostate cancer Maternal Uncle 80  . Prostate cancer Paternal Uncle   . Breast cancer Cousin 70       mat 1st cousin    Social History   Socioeconomic History  . Marital status: Divorced    Spouse name: Not on file  . Number of children: Not on file  . Years of education: Not on file  . Highest education level: Not on file  Occupational History  . Not on file  Social Needs  . Financial resource strain: Not on file  . Food insecurity:    Worry: Not on file    Inability: Not on file  . Transportation needs:    Medical: Not on file    Non-medical: Not on file  Tobacco Use  . Smoking status: Former Smoker    Packs/day: 0.25    Years: 1.00    Pack years: 0.25    Types: Cigarettes  . Smokeless tobacco: Never Used  Substance and Sexual Activity  . Alcohol use: No    Comment: 3 x a week  . Drug use: No  . Sexual activity: Never    Birth control/protection: Surgical  Lifestyle  . Physical activity:    Days per week: Not on file    Minutes per session: Not on file  . Stress: Not on file  Relationships  . Social connections:    Talks on phone: Not on file    Gets together: Not on file    Attends religious service: Not on file    Active member of club or organization: Not on file    Attends meetings of clubs or organizations: Not on file    Relationship status: Not on file  . Intimate partner violence:    Fear of current or ex partner: Not on file    Emotionally abused: Not on file    Physically abused: Not on file    Forced sexual activity: Not on file  Other Topics Concern  . Not on file  Social History Narrative  . Not on file    Past Medical History, Surgical history, Social history, and Family history were reviewed and updated as appropriate.   Please see review of systems for further details on the patient's review from today.   Review of Systems:  Review of Systems  Constitutional: Negative for chills, diaphoresis and fever.  HENT: Negative for  trouble swallowing and voice change.   Respiratory: Negative for cough, chest tightness, shortness of breath and wheezing.   Cardiovascular: Negative for chest pain and palpitations.  Gastrointestinal: Negative for abdominal pain, constipation, diarrhea, nausea and vomiting.  Musculoskeletal: Positive for back pain and myalgias.  Neurological: Negative for dizziness, light-headedness and headaches.    Objective:   Physical Exam:  BP (!) 166/91 (BP Location: Left Wrist, Patient Position: Sitting)  Comment: nurse aware of bp  Pulse (!) 102 Comment: nurse aware of pulse  Temp 98 F (36.7 C) (Oral)   Resp 18   Ht 5\' 2"  (1.575 m)   Wt 289 lb 3.2 oz (131.2 kg)   SpO2 98%   BMI 52.90 kg/m  ECOG: 0  Physical Exam  Constitutional: No distress.  HENT:  Head: Normocephalic and atraumatic.  Cardiovascular: Normal rate, regular rhythm and normal heart sounds. Exam reveals no gallop and no friction rub.  No murmur heard. Pulmonary/Chest: Effort normal and breath sounds normal. No respiratory distress. She has no wheezes. She has no rales.  Musculoskeletal: She exhibits tenderness (Right medial upper arm and bilateral shoulders.).  Neurological: She is alert.  Skin: Skin is warm and dry. No rash noted. She is not diaphoretic. No erythema.    Lab Review:     Component Value Date/Time   NA 140 03/03/2018 1445   NA 139 01/07/2017 0920   K 3.8 03/03/2018 1445   K 4.1 01/07/2017 0920   CL 104 03/03/2018 1445   CL 105 12/26/2011 1538   CO2 27 03/03/2018 1445   CO2 25 01/07/2017 0920   GLUCOSE 109 (H) 03/03/2018 1445   GLUCOSE 111 01/07/2017 0920   GLUCOSE 103 (H) 12/26/2011 1538   BUN 11 03/03/2018 1445   BUN 8.7 01/07/2017 0920   CREATININE 0.75 03/03/2018 1445   CREATININE 0.8 01/07/2017 0920   CALCIUM 9.0 03/03/2018 1445   CALCIUM 8.9 01/07/2017 0920   PROT 7.5 03/03/2018 1445   PROT 7.0 01/07/2017 0920   ALBUMIN 3.5 03/03/2018 1445   ALBUMIN 3.5 01/07/2017 0920   AST 26  03/03/2018 1445   AST 47 (H) 01/07/2017 0920   ALT 25 03/03/2018 1445   ALT 54 01/07/2017 0920   ALKPHOS 93 03/03/2018 1445   ALKPHOS 73 01/07/2017 0920   BILITOT <0.2 (L) 03/03/2018 1445   BILITOT 0.22 01/07/2017 0920   GFRNONAA >60 03/03/2018 1445   GFRAA >60 03/03/2018 1445       Component Value Date/Time   WBC 9.8 03/03/2018 1445   WBC 8.5 12/06/2017 0710   RBC 4.35 03/03/2018 1445   HGB 10.4 (L) 03/03/2018 1445   HGB 11.5 (L) 01/07/2017 0920   HCT 36.0 03/03/2018 1445   HCT 37.6 01/07/2017 0920   PLT 375 03/03/2018 1445   PLT 274 01/07/2017 0920   MCV 82.8 03/03/2018 1445   MCV 85.6 01/07/2017 0920   MCH 23.9 (L) 03/03/2018 1445   MCHC 28.9 (L) 03/03/2018 1445   RDW 16.2 (H) 03/03/2018 1445   RDW 14.4 01/07/2017 0920   LYMPHSABS 3.3 03/03/2018 1445   LYMPHSABS 2.0 01/07/2017 0920   MONOABS 0.8 03/03/2018 1445   MONOABS 0.6 01/07/2017 0920   EOSABS 0.3 03/03/2018 1445   EOSABS 0.3 01/07/2017 0920   BASOSABS 0.0 03/03/2018 1445   BASOSABS 0.0 01/07/2017 0920   -------------------------------  Imaging from last 24 hours (if applicable):  Radiology interpretation: No results found.

## 2018-03-06 ENCOUNTER — Ambulatory Visit (INDEPENDENT_AMBULATORY_CARE_PROVIDER_SITE_OTHER): Payer: PPO | Admitting: Nurse Practitioner

## 2018-03-06 ENCOUNTER — Encounter: Payer: Self-pay | Admitting: Nurse Practitioner

## 2018-03-06 VITALS — BP 140/90 | HR 96 | Temp 98.0°F | Ht 61.5 in | Wt 286.2 lb

## 2018-03-06 DIAGNOSIS — M25571 Pain in right ankle and joints of right foot: Secondary | ICD-10-CM

## 2018-03-06 DIAGNOSIS — R1907 Generalized intra-abdominal and pelvic swelling, mass and lump: Secondary | ICD-10-CM

## 2018-03-06 DIAGNOSIS — G8929 Other chronic pain: Secondary | ICD-10-CM

## 2018-03-06 DIAGNOSIS — R030 Elevated blood-pressure reading, without diagnosis of hypertension: Secondary | ICD-10-CM

## 2018-03-06 DIAGNOSIS — M797 Fibromyalgia: Secondary | ICD-10-CM

## 2018-03-06 MED ORDER — MELOXICAM 7.5 MG PO TABS: 7.5000 mg | ORAL_TABLET | Freq: Every day | ORAL | 2 refills | Status: DC

## 2018-03-06 NOTE — Progress Notes (Signed)
Subjective:     Patient ID: Ashley Lindsey , female    DOB: Apr 22, 1961 , 57 y.o.   MRN: [id number]   Chief Complaint  Patient presents with  . Hypertension  . Ankle Pain    really sore and was checked for shingles     HPI  Hypertension  This is a chronic problem. The current episode started more than 1 year ago. The problem is unchanged. The problem is uncontrolled. Associated symptoms include headaches. Pertinent negatives include no anxiety, blurred vision, peripheral edema or shortness of breath. Risk factors for coronary artery disease include obesity and sedentary lifestyle. There are no compliance problems.  There is no history of angina or kidney disease. There is no history of chronic renal disease or a thyroid problem.  Ankle Pain       Past Medical History:  Diagnosis Date  . Abdominal cramps   . Arthritis   . Cancer [facility])    s/p radiation- last dose 6/12//has been off Tamoxifen 8/12  . Chronic headaches   . Costochondritis   . Fatigue   . Fibromyalgia 03/06/2013  . Lymphedema    RIGHT ARM-////  STATES USE LEFT ARM FOR BP'S  . Migraines   . Muscle spasms of head and/or neck    following to the breast  . Passed out    4th of july  . Personal history of radiation therapy 2010   lt breast   . S/P radiation therapy 08/19/07 - 10/10/07   Left Breast/5040 cGy/28 fractions with Boost for a toatl dose of 6300 dGy  . S/P radiation therapy 08/14/10 -10/03/10   right breast  . Sleep apnea    STOP BANG SCORE 5     Family History  Problem Relation Age of Onset  . Breast cancer Mother 2  . Heart disease Father   . Prostate cancer Maternal Uncle 78  . Cancer Maternal Aunt 63       d. from "female cancer" possibly cervical  . Breast cancer Maternal Aunt 60  . Prostate cancer Paternal Uncle   . Prostate cancer Maternal Grandfather 34  . Prostate cancer Maternal Uncle 80  . Prostate cancer Paternal Uncle   . Breast cancer Cousin 38       mat 1st cousin      Current Outpatient Medications:  .  albuterol (PROAIR HFA) 108 (90 Base) MCG/ACT inhaler, Inhale 1-2 puffs into the lungs every 6 (six) hours as needed for wheezing or shortness of breath., Disp: 1 Inhaler, Rfl: 1 .  Bisacodyl (DULCOLAX PO), Take 1 capsule by mouth daily as needed (constipation). , Disp: , Rfl:  .  cholecalciferol 5000 units TABS, Take 1 tablet (5,000 Units total) by mouth 2 (two) times daily. (Patient taking differently: Take 5,000 Units by mouth daily. ), Disp: , Rfl:  .  cyclobenzaprine (FLEXERIL) 10 MG tablet, TAKE 1 TABLET BY MOUTH TWICE A DAY, Disp: 30 tablet, Rfl: 0 .  doxycycline (VIBRA-TABS) 100 MG tablet, Take 1 tablet (100 mg total) by mouth 2 (two) times daily., Disp: 14 tablet, Rfl: 0 .  fish oil-omega-3 fatty acids 1000 MG capsule, Take 1 g by mouth daily. , Disp: , Rfl:  .  Flaxseed, Linseed, (FLAXSEED OIL MAX STR PO), Take 1 capsule by mouth daily. , Disp: , Rfl:  .  HYDROcodone-acetaminophen (NORCO) 5-325 MG tablet, Take 1 tablet by mouth every 6 (six) hours as needed for moderate pain., Disp: 15 tablet, Rfl: 0 .  Multiple Vitamins-Minerals (MULTIVITAMIN &  MINERAL PO), Take 1 tablet by mouth daily., Disp: , Rfl:  .  omeprazole (PRILOSEC) 20 MG capsule, Take 20 mg by mouth daily., Disp: , Rfl:  .  predniSONE (DELTASONE) 5 MG tablet, 6 tab x 1 day, 5 tab x 1 day, 4 tab x 1 day, 3 tab x 1 day, 2 tab x 1 day, 1 tab x 1 day, stop, Disp: 21 tablet, Rfl: 0 .  prochlorperazine (COMPAZINE) 5 MG tablet, Take 1 tablet (5 mg total) by mouth every 6 (six) hours as needed for nausea or vomiting., Disp: 30 tablet, Rfl: 0 .  propranolol (INDERAL) 20 MG tablet, Take 20 mg by mouth daily., Disp: , Rfl: 3 .  topiramate (TOPAMAX) 25 MG tablet, Take 1 tablet by mouth daily., Disp: , Rfl:  .  triamcinolone cream (KENALOG) 0.1 %, Apply 1 application topically 2 (two) times daily. Apply for up to one week., Disp: 30 g, Rfl: 0 .  UNABLE TO FIND, Rx: L8000-Post Surgical Bra (Quantity:  6) O2423- Silicone Breast Prosthesis (Quantity: 2) Dx: 174.9; Bilateral partial mastectomy Replacement due to hot flashes since previous items were too heavy and hot, Disp: 1 each, Rfl: 0 .  venlafaxine XR (EFFEXOR-XR) 150 MG 24 hr capsule, TAKE 1 CAPSULE BY MOUTH EVERYDAY AT BEDTIME, Disp: 90 capsule, Rfl: 0 .  venlafaxine XR (EFFEXOR-XR) 75 MG 24 hr capsule, TAKE 1 CAPSULE DAILY ALONG WITH 150MG  CAP TO MAKE IT 225MG  TOTAL, Disp: 90 capsule, Rfl: 0 .  vitamin E 200 UNIT capsule, Take 200 Units by mouth daily., Disp: , Rfl:  .  ibuprofen (ADVIL,MOTRIN) 800 MG tablet, Take 800 mg by mouth every 8 (eight) hours as needed for moderate pain. For pain, Disp: , Rfl:  .  propranolol ER (INDERAL LA) 80 MG 24 hr capsule, Take 1 capsule (80 mg total) by mouth daily. (Patient not taking: Reported on 10/21/2017), Disp: , Rfl:  .  senna (SENOKOT) 8.6 MG tablet, Take 1 tablet by mouth daily as needed for constipation. , Disp: , Rfl:    Allergies  Allergen Reactions  . Aspirin     unknown  . Penicillins Hives and Swelling    Has patient had a PCN reaction causing immediate rash, facial/tongue/throat swelling, SOB or lightheadedness with hypotension: Yes Has patient had a PCN reaction causing severe rash involving mucus membranes or skin necrosis: Yes Has patient had a PCN reaction that required hospitalization Yes Has patient had a PCN reaction occurring within the last 10 years: No If all of the above answers are "NO", then may proceed with Cephalosporin use.      Review of Systems  Constitutional: Negative.   HENT: Negative.   Eyes: Negative for blurred vision.  Respiratory: Negative.  Negative for shortness of breath.   Cardiovascular: Negative.   Musculoskeletal: Negative.   Neurological: Positive for headaches. Negative for dizziness.  Hematological: Negative.      Today's Vitals   03/06/18 1059  BP: 140/90  Pulse: 96  Temp: 98 F (36.7 C)  TempSrc: Oral  SpO2: 96%  Weight: 286 lb 3.2 oz  (129.8 kg)  Height: 5' 1.5" (1.562 m)  PainSc: 6    Body mass index is 53.2 kg/m.   Objective:  Physical Exam  Constitutional: She is oriented to person, place, and time. She appears well-developed and well-nourished.  Cardiovascular: Normal rate, regular rhythm and normal heart sounds.  Pulmonary/Chest: Effort normal and breath sounds normal.  Musculoskeletal: Normal range of motion. She exhibits tenderness (right ankle  tenderness). She exhibits no edema or deformity.  Neurological: She is alert and oriented to person, place, and time.  Skin: Skin is warm and dry. Capillary refill takes less than 2 seconds.        Assessment And Plan:   1. Elevated blood-pressure reading without diagnosis of hypertension  She is having pain to her ankle which is likely causing her elevated blood pressure  Encouraged to limit intake of salt containing foods  2. Chronic pain of right ankle  Persistent pain to her right ankle  She is planning to go to the orthopedic Dr. Durward Fortes for further evaluation  3. Generalized swelling, mass, or lump of abdomen or pelvis  Bulging noted to abdomen, soft and reduceable  Will obtain an ultrasound to check for possible hernia. - FAST [place]; Future          [name], FNP

## 2018-03-07 ENCOUNTER — Other Ambulatory Visit: Payer: Self-pay | Admitting: Nurse Practitioner

## 2018-03-14 ENCOUNTER — Telehealth: Payer: Self-pay

## 2018-03-14 MED ORDER — CYCLOBENZAPRINE HCL 10 MG PO TABS: 10.0000 mg | ORAL_TABLET | Freq: Two times a day (BID) | ORAL | 1 refills | Status: DC | PRN

## 2018-03-14 NOTE — Telephone Encounter (Signed)
Patient called stating she needs a referral on flexeril, the medication she was given is causing her a headache and she has not heard back from the orthopaedic specialist.   Returned pt call advised her that the rx has been sent and that the meloxicam should not be causing her headaches because it is in the same drug class as ibuprofen. Patient notified me that she has an appointment with the orthopaedic on 12/06. YRL,RMA

## 2018-03-27 ENCOUNTER — Other Ambulatory Visit: Payer: Self-pay

## 2018-03-27 ENCOUNTER — Telehealth: Payer: Self-pay | Admitting: *Deleted

## 2018-03-27 DIAGNOSIS — M797 Fibromyalgia: Secondary | ICD-10-CM

## 2018-03-27 DIAGNOSIS — G894 Chronic pain syndrome: Secondary | ICD-10-CM

## 2018-03-27 DIAGNOSIS — Z803 Family history of malignant neoplasm of breast: Secondary | ICD-10-CM

## 2018-03-27 NOTE — Telephone Encounter (Signed)
Requested information/collaborated before return call to patient.  Communicated to contact fibromyalgia specialist and or rheumatologist in reference to pain and Hydrocodone renewal.  Continue wearing sleeve.  Ashley Lindsey reports "I do not have fibromyalgia specialist of rheumatologist.  I will call [facility], I see [name] NP under Dr. Baird Cancer.  I will call back if referred back for lymphadema.

## 2018-03-27 NOTE — Telephone Encounter (Signed)
"  [facility] [phone]).  I need a Hydrocodone refill.  Never felt pain like this.  Is this how normal Lymphedema pain feels?  Wearing sleeve a couple weeks as instructed, rotating to both arms.  Concerned about arms because someone told me arm pain causes strokes.    Initially felt tingling like my arms falling asleep this past Friday.  Left arm not as bad.  Right arm pain radiates to nipple.    Getting worse daily with continuous burning sensation like a match lit to my arm.    Hydrocodone pain medicine not working for the burning.  PCP discontined Ibuprofen when Meloxicam ordered.  Capsaicin cream (I started) only helps a few minutes.   Not eating as much.  Mouth feels hot, foods don't taste good, drinking lots of water.  32 oz already today.      No rash or skin color changes anywhere on my body.  When arms are touched I see indentations.  Not as deep right now so swelling worse at night.  Never had shingles.       Able to move arms and legs.  Trying not to use right arm/hand as much because of the pain.  Using left arm more but right handed.  My niece said last night's B/P = 144/90 at 10:00 pm.      I only have two Hydrocodone pills left for today is why I need refill.    Don't think I need to be seen.  This is similar to muscle pain last month when Hydrocodone and prednisone were ordered.  The Prednisone helped really well.    Started using ice packs this morning which relieves burning.  When ice is removed, burning immediately returns."  Forwarding call information to collaborative for provider review of refill or intervention orders.   Communicated best to stop capsaicin cream which contains chilli peppers which cause a heating sensation.

## 2018-03-28 ENCOUNTER — Ambulatory Visit (INDEPENDENT_AMBULATORY_CARE_PROVIDER_SITE_OTHER): Payer: PPO | Admitting: Orthopaedic Surgery

## 2018-04-01 ENCOUNTER — Other Ambulatory Visit: Payer: Self-pay | Admitting: Nurse Practitioner

## 2018-04-04 ENCOUNTER — Ambulatory Visit (INDEPENDENT_AMBULATORY_CARE_PROVIDER_SITE_OTHER): Payer: PPO | Admitting: Orthopaedic Surgery

## 2018-04-05 ENCOUNTER — Other Ambulatory Visit: Payer: Self-pay | Admitting: Hematology and Oncology

## 2018-04-09 ENCOUNTER — Ambulatory Visit (INDEPENDENT_AMBULATORY_CARE_PROVIDER_SITE_OTHER): Payer: PPO | Admitting: Orthopaedic Surgery

## 2018-04-14 ENCOUNTER — Telehealth: Payer: Self-pay

## 2018-04-14 NOTE — Telephone Encounter (Signed)
Patient called stating she would like a refill of the z pack and cough medicine.  Returned pt call and left her v/m to call office to schedule her an appointment. YRL,RMA

## 2018-04-24 ENCOUNTER — Ambulatory Visit (INDEPENDENT_AMBULATORY_CARE_PROVIDER_SITE_OTHER): Payer: Medicare Other | Admitting: Orthopaedic Surgery

## 2018-04-24 ENCOUNTER — Ambulatory Visit (INDEPENDENT_AMBULATORY_CARE_PROVIDER_SITE_OTHER): Payer: Self-pay

## 2018-04-24 ENCOUNTER — Encounter (INDEPENDENT_AMBULATORY_CARE_PROVIDER_SITE_OTHER): Payer: Self-pay | Admitting: Orthopaedic Surgery

## 2018-04-24 VITALS — BP 131/85 | HR 100 | Resp 16 | Ht 62.0 in | Wt 284.0 lb

## 2018-04-24 DIAGNOSIS — M25571 Pain in right ankle and joints of right foot: Secondary | ICD-10-CM | POA: Diagnosis not present

## 2018-04-24 DIAGNOSIS — M25561 Pain in right knee: Secondary | ICD-10-CM | POA: Diagnosis not present

## 2018-04-24 DIAGNOSIS — G8929 Other chronic pain: Secondary | ICD-10-CM | POA: Diagnosis not present

## 2018-04-24 MED ORDER — HYDROCODONE-ACETAMINOPHEN 5-325 MG PO TABS: 1.0000 | ORAL_TABLET | Freq: Four times a day (QID) | ORAL | 0 refills | Status: DC | PRN

## 2018-04-24 NOTE — Progress Notes (Signed)
Office Visit Note   Patient: Ashley Lindsey           Date of Birth: 1961/03/12           MRN: [id number] Visit Date: 04/24/2018              Requested by: [name], [address] [place], [place] [address] PCP: [name], MD   Assessment & Plan: Visit Diagnoses:  1. Chronic pain of right ankle   2. Chronic pain of right knee     Plan: Exacerbation of osteoarthritis right knee from recent fall.  No acute changes.  Also having right ankle pain a result of the fall but without any changes on x-ray.  Will apply ankle support.  Does have active fibromyalgia.  Has had multiple prescriptions for oxycodone and hydrocodone the past year and a half from PMP aware.  We will give her a prescription for hydrocodone No. 30 and no refills  Follow-Up Instructions: Return if symptoms worsen or fail to improve.   Orders:  Orders Placed This Encounter  Procedures  . XR KNEE 3 VIEW RIGHT  . XR Ankle 2 Views Right   No orders of the defined types were placed in this encounter.     Procedures: No procedures performed   Clinical Data: No additional findings.   Subjective: Chief Complaint  Patient presents with  . Right Knee - Pain  . Right Ankle - Pain  Ashley Lindsey presents in the office with right ankle and right knee pain. Patient reports that she fell trying to walk up a hill on 03/20/2018. Patient reports swelling in the ankle and knee since the fall. Patient elevates her leg daily to reduce swelling.  Patient states her right leg feels "like a toothache". Patient also reports a rash on her right ankle.  Has established diagnosis of advanced osteoarthritis right knee.  BMI is over 50.  Still has active fibromyalgia.  The more trouble with her right ankle than her right knee and notes that she has some swelling of the ankle as the day progresses. HPI  Review of Systems  Constitutional: Negative for chills and fatigue.  HENT: Positive for tinnitus. Negative  for sinus pressure.   Eyes: Negative for redness and visual disturbance.  Respiratory: Negative for shortness of breath and wheezing.   Cardiovascular: Negative for chest pain.  Gastrointestinal: Negative for abdominal pain, constipation and diarrhea.  Endocrine: Negative for polyuria.  Genitourinary: Negative for flank pain and pelvic pain.  Musculoskeletal: Positive for joint swelling. Negative for back pain.  Skin: Positive for rash.  Allergic/Immunologic: Negative for immunocompromised state.  Neurological: Positive for headaches. Negative for dizziness and weakness.  Hematological: Does not bruise/bleed easily.  Psychiatric/Behavioral: Negative for confusion. The patient is not nervous/anxious.      Objective: Vital Signs: BP 131/85 (BP Location: Right Wrist, Patient Position: Sitting, Cuff Size: Normal)   Pulse 100   Resp 16   Ht 5\' 2"  (1.575 m)   Wt 284 lb (128.8 kg)   BMI 51.94 kg/m   Physical Exam Constitutional:      Appearance: She is well-developed.  Eyes:     Pupils: Pupils are equal, round, and reactive to light.  Pulmonary:     Effort: Pulmonary effort is normal.  Skin:    General: Skin is warm and dry.  Neurological:     Mental Status: She is alert and oriented to person, place, and time.  Psychiatric:  Behavior: Behavior normal.     Ortho Exam awake alert and oriented x3.  Comfortable sitting.  Has multiple areas of tenderness about the right knee and ankle swelling of the ankle.  Pes planus occult to determine whether or not posterior tibial tendon is functioning no loss of subtalar motion.  Flex and extend ankle without much trouble.  Skin intact films are negative for any acute change Specialty Comments:  No specialty comments available.  Imaging: Xr Ankle 2 Views Right  Result Date: 04/24/2018 Films of the right ankle did not demonstrate any acute change.  Ankle joint is well-maintained without cystic change.  There are large spurs on the  plantar aspect and to a lesser extent the posterior aspect of the os calcis.  Os peroneum is present.  Flattening of the midfoot  Xr Knee 3 View Right  Result Date: 04/24/2018 Films of the right knee revealed no acute changes.  There is significant decrease in the medial joint space with increased varus.  No ectopic calcification.  There are considerable changes at the patellofemoral joint lateral compartment as well.  Large osteophytes about the patellofemoral joint with lateral patella tilt. films consistent with advanced osteoarthritis and no significant change from prior films    PMFS History: Patient Active Problem List   Diagnosis Date Noted  . Chronic pain of right ankle 04/24/2018  . Breast cancer of lower-outer quadrant of right female breast ([place]) 12/02/2014  . Bronchospasm 09/21/2014  . Dyspnea 09/21/2014  . Chronic pain of right knee 09/21/2014  . Fibromyalgia 03/06/2013  . Dense breasts 03/06/2013  . Mastalgia 03/06/2013  . Back pain, lumbosacral 03/06/2013  . Lymphedema of arm 03/06/2013  . Atypical chest pain 01/06/2013  . Breast cancer of upper-inner quadrant of left female breast ([place]) 09/08/2012  . Family history of malignant neoplasm of breast 09/08/2012  . S/P radiation therapy   . History of breast cancer in female 08/13/2011   Past Medical History:  Diagnosis Date  . Abdominal cramps   . Arthritis   . Cancer [facility])    s/p radiation- last dose 6/12//has been off Tamoxifen 8/12  . Chronic headaches   . Costochondritis   . Fatigue   . Fibromyalgia 03/06/2013  . Lymphedema    RIGHT ARM-////  STATES USE LEFT ARM FOR BP'S  . Migraines   . Muscle spasms of head and/or neck    following to the breast  . Passed out    4th of july  . Personal history of radiation therapy 2010   lt breast   . S/P radiation therapy 08/19/07 - 10/10/07   Left Breast/5040 cGy/28 fractions with Boost for a toatl dose of 6300 dGy  . S/P radiation therapy 08/14/10 -10/03/10   right  breast  . Sleep apnea    STOP BANG SCORE 5    Family History  Problem Relation Age of Onset  . Breast cancer Mother 42  . Heart disease Father   . Prostate cancer Maternal Uncle 78  . Cancer Maternal Aunt 63       d. from "female cancer" possibly cervical  . Breast cancer Maternal Aunt 60  . Prostate cancer Paternal Uncle   . Prostate cancer Maternal Grandfather 39  . Prostate cancer Maternal Uncle 80  . Prostate cancer Paternal Uncle   . Breast cancer Cousin 51       mat 1st cousin    Past Surgical History:  Procedure Laterality Date  . ABDOMINAL HYSTERECTOMY  with left salpingooophorectomy  . BREAST LUMPECTOMY  2009/2012   LEFT/RIGHT with lymph node dissection  . KNEE ARTHROSCOPY     right  . OOPHORECTOMY  2013   Social History   Occupational History  . Not on file  Tobacco Use  . Smoking status: Former Smoker    Packs/day: 0.25    Years: 1.00    Pack years: 0.25    Types: Cigarettes  . Smokeless tobacco: Never Used  Substance and Sexual Activity  . Alcohol use: Not Currently  . Drug use: No  . Sexual activity: Never    Birth control/protection: Surgical

## 2018-05-06 ENCOUNTER — Emergency Department (HOSPITAL_COMMUNITY)
Admission: EM | Admit: 2018-05-06 | Discharge: 2018-05-06 | Disposition: A | Discharge status: Home / Self Care | Payer: Medicare Other | Attending: Emergency Medicine | Admitting: Emergency Medicine

## 2018-05-06 ENCOUNTER — Emergency Department (HOSPITAL_COMMUNITY): Payer: Medicare Other

## 2018-05-06 ENCOUNTER — Encounter (HOSPITAL_COMMUNITY): Payer: Self-pay

## 2018-05-06 DIAGNOSIS — Z853 Personal history of malignant neoplasm of breast: Secondary | ICD-10-CM | POA: Diagnosis not present

## 2018-05-06 DIAGNOSIS — Z87891 Personal history of nicotine dependence: Secondary | ICD-10-CM | POA: Insufficient documentation

## 2018-05-06 DIAGNOSIS — Z79899 Other long term (current) drug therapy: Secondary | ICD-10-CM | POA: Diagnosis not present

## 2018-05-06 DIAGNOSIS — J181 Lobar pneumonia, unspecified organism: Secondary | ICD-10-CM

## 2018-05-06 DIAGNOSIS — R079 Chest pain, unspecified: Secondary | ICD-10-CM | POA: Diagnosis not present

## 2018-05-06 DIAGNOSIS — J189 Pneumonia, unspecified organism: Secondary | ICD-10-CM | POA: Diagnosis not present

## 2018-05-06 DIAGNOSIS — R05 Cough: Secondary | ICD-10-CM | POA: Diagnosis not present

## 2018-05-06 LAB — BASIC METABOLIC PANEL
Anion gap: 9 (ref 5–15)
BUN: 10 mg/dL (ref 6–20)
CO2: 28 mmol/L (ref 22–32)
Calcium: 8.7 mg/dL — ABNORMAL LOW (ref 8.9–10.3)
Chloride: 100 mmol/L (ref 98–111)
Creatinine, Ser: 0.77 mg/dL (ref 0.44–1.00)
GFR calc Af Amer: >60 mL/min (ref >60)
GFR calc non Af Amer: >60 mL/min (ref >60)
Glucose, Bld: 137 mg/dL — ABNORMAL HIGH (ref 70–99)
Potassium: 4.1 mmol/L (ref 3.5–5.1)
Sodium: 137 mmol/L (ref 135–145)

## 2018-05-06 LAB — CBC
HCT: 37 % (ref 36.0–46.0)
Hemoglobin: 10.4 g/dL — ABNORMAL LOW (ref 12.0–15.0)
MCH: 23.8 pg — ABNORMAL LOW (ref 26.0–34.0)
MCHC: 28.1 g/dL — ABNORMAL LOW (ref 30.0–36.0)
MCV: 84.7 fL (ref 80.0–100.0)
Platelets: 360 10*3/uL (ref 150–400)
RBC: 4.37 MIL/uL (ref 3.87–5.11)
RDW: 16.3 % — ABNORMAL HIGH (ref 11.5–15.5)
WBC: 10.1 10*3/uL (ref 4.0–10.5)
nRBC: 0 % (ref 0.0–0.2)

## 2018-05-06 LAB — POCT I-STAT TROPONIN I: Troponin i, poc: 0 ng/mL (ref 0.00–0.08)

## 2018-05-06 MED ORDER — GUAIFENESIN-DM 100-10 MG/5ML PO SYRP: 5.0000 mL | ORAL_SOLUTION | Freq: Three times a day (TID) | ORAL | 0 refills | Status: DC | PRN

## 2018-05-06 MED ORDER — DOXYCYCLINE HYCLATE 100 MG PO TABS: 100.0000 mg | ORAL_TABLET | Freq: Two times a day (BID) | ORAL | 0 refills | Status: DC

## 2018-05-06 NOTE — ED Provider Notes (Signed)
[facility] DEPT Provider Note   CSN: [id number] Arrival date & time: 05/06/18  1035     History   Chief Complaint Chief Complaint  Patient presents with  . Chest Pain  . Cough    HPI Ashley Lindsey is a 58 y.o. female.  HPI Patient presents with chest pain and cough.  Has had for the last couple days.  States some initially clear sputum production has now turned to green.  States he has had some chills.  States she is been around some kids with similar symptoms.  Dull chest pain.  No swelling in her legs.  No frank fever but states she has had chills.  Has a history of fibromyalgia and states she is hurting all over.  States she is coughing worse when she lays down at night.  States her head is congestion in her nose is running. Past Medical History:  Diagnosis Date  . Abdominal cramps   . Arthritis   . Cancer [facility])    s/p radiation- last dose 6/12//has been off Tamoxifen 8/12  . Chronic headaches   . Costochondritis   . Fatigue   . Fibromyalgia 03/06/2013  . Lymphedema    RIGHT ARM-////  STATES USE LEFT ARM FOR BP'S  . Migraines   . Muscle spasms of head and/or neck    following to the breast  . Passed out    4th of july  . Personal history of radiation therapy 2010   lt breast   . S/P radiation therapy 08/19/07 - 10/10/07   Left Breast/5040 cGy/28 fractions with Boost for a toatl dose of 6300 dGy  . S/P radiation therapy 08/14/10 -10/03/10   right breast  . Sleep apnea    STOP BANG SCORE 5    Patient Active Problem List   Diagnosis Date Noted  . Chronic pain of right ankle 04/24/2018  . Breast cancer of lower-outer quadrant of right female breast ([place]) 12/02/2014  . Bronchospasm 09/21/2014  . Dyspnea 09/21/2014  . Chronic pain of right knee 09/21/2014  . Fibromyalgia 03/06/2013  . Dense breasts 03/06/2013  . Mastalgia 03/06/2013  . Back pain, lumbosacral 03/06/2013  . Lymphedema of arm 03/06/2013  . Atypical chest pain  01/06/2013  . Breast cancer of upper-inner quadrant of left female breast ([place]) 09/08/2012  . Family history of malignant neoplasm of breast 09/08/2012  . S/P radiation therapy   . History of breast cancer in female 08/13/2011    Past Surgical History:  Procedure Laterality Date  . ABDOMINAL HYSTERECTOMY     with left salpingooophorectomy  . BREAST LUMPECTOMY  2009/2012   LEFT/RIGHT with lymph node dissection  . KNEE ARTHROSCOPY     right  . OOPHORECTOMY  2013     OB History   No obstetric history on file.      Home Medications    Prior to Admission medications   Medication Sig Start Date End Date Taking? Authorizing Provider  albuterol (PROAIR HFA) 108 (90 Base) MCG/ACT inhaler Inhale 1-2 puffs into the lungs every 6 (six) hours as needed for wheezing or shortness of breath. 09/20/17  Yes Tanner, Lyndon Code., PA-C  Bisacodyl (DULCOLAX PO) Take 1 capsule by mouth daily as needed (constipation).    Yes [provider]  cholecalciferol 5000 units TABS Take 1 tablet (5,000 Units total) by mouth 2 (two) times daily. Patient taking differently: Take 5,000 Units by mouth daily.  07/21/15  Yes Nicholas Lose, MD  cyclobenzaprine (FLEXERIL)  10 MG tablet Take 1 tablet (10 mg total) by mouth 2 (two) times daily as needed for muscle spasms. 03/14/18  Yes [name], FNP  fish oil-omega-3 fatty acids 1000 MG capsule Take 1 g by mouth daily.    Yes [provider]  Flaxseed, Linseed, (FLAXSEED OIL MAX STR PO) Take 1 capsule by mouth daily.    Yes [provider]  meloxicam (MOBIC) 7.5 MG tablet Take 1 tablet (7.5 mg total) by mouth daily. 03/06/18  Yes [name], FNP  Multiple Vitamins-Minerals (MULTIVITAMIN & MINERAL PO) Take 1 tablet by mouth daily.   Yes [provider]  omeprazole (PRILOSEC) 20 MG capsule Take 20 mg by mouth daily.   Yes [provider]  propranolol (INDERAL) 20 MG tablet TAKE 1 TABLET BY MOUTH EVERY DAY Patient taking  differently: Take 20 mg by mouth daily.  03/07/18  Yes [name], FNP  senna (SENOKOT) 8.6 MG tablet Take 1 tablet by mouth daily as needed for constipation.    Yes [provider]  SYMBICORT 80-4.5 MCG/ACT inhaler INHALE 2 PUFF BY INHALATION ROUTE EVERY DAY IN THE MORNING AND EVENING Patient taking differently: Inhale 2 puffs into the lungs 2 (two) times daily.  04/01/18  Yes [name], FNP  triamcinolone cream (KENALOG) 0.1 % Apply 1 application topically 2 (two) times daily. Apply for up to one week. Patient taking differently: Apply 1 application topically 2 (two) times daily as needed (rash).  12/29/16  Yes Vanessa Kick, MD  UNABLE TO FIND Rx: L8000-Post Surgical Bra (Quantity: 6) G4010- Silicone Breast Prosthesis (Quantity: 2) Dx: 174.9; Bilateral partial mastectomy Replacement due to hot flashes since previous items were too heavy and hot 01/20/13  Yes Fanny Skates, MD  venlafaxine XR (EFFEXOR-XR) 150 MG 24 hr capsule TAKE 1 CAPSULE BY MOUTH EVERYDAY AT BEDTIME Patient taking differently: Take 150 mg by mouth See admin instructions. Nightly with 75 mg capsule, total of 225 mg nightly 02/24/18  Yes Nicholas Lose, MD  venlafaxine XR (EFFEXOR-XR) 75 MG 24 hr capsule TAKE 1 CAPSULE DAILY ALONG WITH 150MG  CAP TO MAKE IT 225MG  TOTAL Patient taking differently: Take 75 mg by mouth See admin instructions. TAKE 1 CAPSULE NIGHTLY ALONG WITH 150MG  CAP TO MAKE IT 225MG  TOTAL 04/07/18  Yes Nicholas Lose, MD  vitamin E 200 UNIT capsule Take 200 Units by mouth daily.   Yes [provider]  doxycycline (VIBRA-TABS) 100 MG tablet Take 1 tablet (100 mg total) by mouth 2 (two) times daily. 05/06/18   Davonna Belling, MD  guaiFENesin-dextromethorphan (ROBITUSSIN DM) 100-10 MG/5ML syrup Take 5 mLs by mouth 3 (three) times daily as needed for cough. 05/06/18   Davonna Belling, MD  HYDROcodone-acetaminophen (NORCO) 5-325 MG tablet Take 1 tablet by mouth every 6 (six) hours as needed for  moderate pain. Patient not taking: Reported on 04/24/2018 03/03/18   Harle Stanford., PA-C  HYDROcodone-acetaminophen (NORCO/VICODIN) 5-325 MG tablet Take 1 tablet by mouth every 6 (six) hours as needed for moderate pain. Patient not taking: Reported on 05/06/2018 04/24/18   Cherylann Ratel, PA-C  predniSONE (DELTASONE) 5 MG tablet 6 tab x 1 day, 5 tab x 1 day, 4 tab x 1 day, 3 tab x 1 day, 2 tab x 1 day, 1 tab x 1 day, stop Patient not taking: Reported on 04/24/2018 03/03/18   Harle Stanford., PA-C  prochlorperazine (COMPAZINE) 5 MG tablet Take 1 tablet (5 mg total) by mouth every 6 (six) hours as needed for  nausea or vomiting. Patient not taking: Reported on 05/06/2018 03/03/18   Harle Stanford., PA-C  propranolol ER (INDERAL LA) 80 MG 24 hr capsule Take 1 capsule (80 mg total) by mouth daily. Patient not taking: Reported on 10/21/2017 05/16/15   Nicholas Lose, MD    Family History Family History  Problem Relation Age of Onset  . Breast cancer Mother 56  . Heart disease Father   . Prostate cancer Maternal Uncle 78  . Cancer Maternal Aunt 63       d. from "female cancer" possibly cervical  . Breast cancer Maternal Aunt 60  . Prostate cancer Paternal Uncle   . Prostate cancer Maternal Grandfather 34  . Prostate cancer Maternal Uncle 80  . Prostate cancer Paternal Uncle   . Breast cancer Cousin 55       mat 1st cousin    Social History Social History   Tobacco Use  . Smoking status: Former Smoker    Packs/day: 0.25    Years: 1.00    Pack years: 0.25    Types: Cigarettes  . Smokeless tobacco: Never Used  Substance Use Topics  . Alcohol use: Not Currently  . Drug use: No     Allergies   Aspirin and Penicillins   Review of Systems Review of Systems  Constitutional: Positive for appetite change and chills. Negative for fever.  HENT: Positive for congestion.   Respiratory: Positive for cough.   Cardiovascular: Positive for chest pain.  Gastrointestinal: Negative for abdominal  pain.  Genitourinary: Negative for flank pain.  Musculoskeletal: Positive for myalgias.  Skin: Negative for rash.  Neurological: Negative for weakness.  Psychiatric/Behavioral: Negative for confusion.     Physical Exam Updated Vital Signs BP 125/84 (BP Location: Right Arm)   Pulse 89   Temp 98.7 F (37.1 C) (Oral)   Resp 18   Ht 5\' 2"  (1.575 m)   Wt 127 kg   SpO2 100%   BMI 51.21 kg/m   Physical Exam HENT:     Head: Normocephalic.  Eyes:     Pupils: Pupils are equal, round, and reactive to light.  Neck:     Musculoskeletal: Neck supple.  Cardiovascular:     Rate and Rhythm: Normal rate and regular rhythm.  Pulmonary:     Comments: Harsh breath sounds.  Worse bilateral bases. Chest:     Chest wall: No tenderness.  Abdominal:     Palpations: Abdomen is soft.     Tenderness: There is no abdominal tenderness.  Musculoskeletal:     Right lower leg: No edema.     Left lower leg: No edema.  Skin:    General: Skin is warm.     Capillary Refill: Capillary refill takes less than 2 seconds.  Neurological:     General: No focal deficit present.     Mental Status: She is alert.      ED Treatments / Results  Labs (all labs ordered are listed, but only abnormal results are displayed) Labs Reviewed  BASIC METABOLIC PANEL - Abnormal; Notable for the following components:      Result Value   Glucose, Bld 137 (*)    Calcium 8.7 (*)    All other components within normal limits  CBC - Abnormal; Notable for the following components:   Hemoglobin 10.4 (*)    MCH 23.8 (*)    MCHC 28.1 (*)    RDW 16.3 (*)    All other components within normal limits  I-STAT TROPONIN, ED  POCT I-STAT TROPONIN I    EKG EKG Interpretation  Date/Time:  Tuesday May 06 2018 10:45:50 EST Ventricular Rate:  86 PR Interval:    QRS Duration: 86 QT Interval:  384 QTC Calculation: 460 R Axis:   32 Text Interpretation:  Sinus rhythm Consider left ventricular hypertrophy Confirmed by  Davonna Belling [phone]) on 05/06/2018 4:10:38 PM   Radiology Dg Chest 2 View  Result Date: 05/06/2018 CLINICAL DATA:  Cough and congestion.  History of breast cancer. EXAM: CHEST - 2 VIEW COMPARISON:  CT 12/06/2017.  Chest x-ray 16 2019. FINDINGS: Mediastinum hilar structures normal. Heart size normal. Low lung volumes. Mild bibasilar subsegmental atelectasis. Mild right base infiltrate can not be excluded. No pleural effusion or pneumothorax. IMPRESSION: Low lung volumes. Mild bibasilar subsegmental atelectasis. Mild right base infiltrate can not be excluded. Electronically Signed   By: Marcello Moores  Register   On: 05/06/2018 12:26    Procedures Procedures (including critical care time)  Medications Ordered in ED Medications - No data to display   Initial Impression / Assessment and Plan / ED Course  I have reviewed the triage vital signs and the nursing notes.  Pertinent labs & imaging results that were available during my care of the patient were reviewed by me and considered in my medical decision making (see chart for details).     Patient with cough.  Sputum is gone from clear to green.  Has had chills.  X-ray showed possible pneumonia.  There are some focal findings at bases particularly right base.  Will treat with doxycycline.  Given cough medicine prescription also.  Discharge home.  Final Clinical Impressions(s) / ED Diagnoses   Final diagnoses:  Community acquired pneumonia of right lower lobe of lung [facility])    ED Discharge Orders         Ordered    doxycycline (VIBRA-TABS) 100 MG tablet  2 times daily     05/06/18 1636    guaiFENesin-dextromethorphan (ROBITUSSIN DM) 100-10 MG/5ML syrup  3 times daily PRN     05/06/18 1636           Davonna Belling, MD 05/06/18 1652

## 2018-05-06 NOTE — ED Triage Notes (Addendum)
Pt presents with c/o chest pain that started this morning. Pt also reports a cough and reports that the more she coughs, the more her chest is hurting. Pt also reports right arm tingling. No neuro deficits noted.

## 2018-05-11 ENCOUNTER — Other Ambulatory Visit: Payer: Self-pay | Admitting: Nurse Practitioner

## 2018-05-28 ENCOUNTER — Other Ambulatory Visit: Payer: Self-pay | Admitting: Hematology and Oncology

## 2018-06-01 ENCOUNTER — Other Ambulatory Visit: Payer: Self-pay | Admitting: Nurse Practitioner

## 2018-06-01 DIAGNOSIS — M797 Fibromyalgia: Secondary | ICD-10-CM

## 2018-06-06 ENCOUNTER — Ambulatory Visit (INDEPENDENT_AMBULATORY_CARE_PROVIDER_SITE_OTHER): Payer: Medicare Other | Admitting: Nurse Practitioner

## 2018-06-06 ENCOUNTER — Other Ambulatory Visit: Payer: Self-pay

## 2018-06-06 ENCOUNTER — Encounter: Payer: Self-pay | Admitting: Nurse Practitioner

## 2018-06-06 VITALS — BP 140/90 | HR 92 | Temp 98.2°F | Ht 62.4 in | Wt 292.4 lb

## 2018-06-06 DIAGNOSIS — Z6841 Body Mass Index (BMI) 40.0 and over, adult: Secondary | ICD-10-CM

## 2018-06-06 DIAGNOSIS — M797 Fibromyalgia: Secondary | ICD-10-CM

## 2018-06-06 DIAGNOSIS — R030 Elevated blood-pressure reading, without diagnosis of hypertension: Secondary | ICD-10-CM | POA: Diagnosis not present

## 2018-06-06 DIAGNOSIS — R609 Edema, unspecified: Secondary | ICD-10-CM | POA: Diagnosis not present

## 2018-06-06 DIAGNOSIS — D649 Anemia, unspecified: Secondary | ICD-10-CM | POA: Diagnosis not present

## 2018-06-06 MED ORDER — HYDROCHLOROTHIAZIDE 12.5 MG PO TABS: 12.5000 mg | ORAL_TABLET | Freq: Every day | ORAL | 1 refills | Status: DC

## 2018-06-06 NOTE — Progress Notes (Signed)
Subjective:     Patient ID: Ashley Lindsey , female    DOB: Mar 21, 1961 , 58 y.o.   MRN: [id number]   Chief Complaint  Patient presents with  . Hypertension    HPI  She is to have a MRI of her left breast next Thursday by Dr. Patrice Paradise  She has swelling to her lower extremities bilaterally at the end of the day.  She is drinking approximately 4 - 16 oz bottles of water a day.  She feels like been craving ice recently.    Hypertension  This is a chronic problem. The current episode started more than 1 year ago. The problem is unchanged. The problem is controlled. Pertinent negatives include no anxiety, chest pain, headaches, palpitations or shortness of breath. There are no associated agents to hypertension. Risk factors for coronary artery disease include obesity and sedentary lifestyle. Past treatments include beta blockers. The current treatment provides no improvement. There are no compliance problems.  There is no history of angina. There is no history of chronic renal disease.     Past Medical History:  Diagnosis Date  . Abdominal cramps   . Arthritis   . Cancer [facility])    s/p radiation- last dose 6/12//has been off Tamoxifen 8/12  . Chronic headaches   . Costochondritis   . Fatigue   . Fibromyalgia 03/06/2013  . Lymphedema    RIGHT ARM-////  STATES USE LEFT ARM FOR BP'S  . Migraines   . Muscle spasms of head and/or neck    following to the breast  . Passed out    4th of july  . Personal history of radiation therapy 2010   lt breast   . S/P radiation therapy 08/19/07 - 10/10/07   Left Breast/5040 cGy/28 fractions with Boost for a toatl dose of 6300 dGy  . S/P radiation therapy 08/14/10 -10/03/10   right breast  . Sleep apnea    STOP BANG SCORE 5     Family History  Problem Relation Age of Onset  . Breast cancer Mother 68  . Heart disease Father   . Prostate cancer Maternal Uncle 78  . Cancer Maternal Aunt 63       d. from "female cancer" possibly cervical  . Breast  cancer Maternal Aunt 60  . Prostate cancer Paternal Uncle   . Prostate cancer Maternal Grandfather 29  . Prostate cancer Maternal Uncle 80  . Prostate cancer Paternal Uncle   . Breast cancer Cousin 46       mat 1st cousin     Current Outpatient Medications:  .  albuterol (PROAIR HFA) 108 (90 Base) MCG/ACT inhaler, Inhale 1-2 puffs into the lungs every 6 (six) hours as needed for wheezing or shortness of breath., Disp: 1 Inhaler, Rfl: 1 .  Bisacodyl (DULCOLAX PO), Take 1 capsule by mouth daily as needed (constipation). , Disp: , Rfl:  .  cholecalciferol 5000 units TABS, Take 1 tablet (5,000 Units total) by mouth 2 (two) times daily. (Patient taking differently: Take 5,000 Units by mouth daily. ), Disp: , Rfl:  .  cyclobenzaprine (FLEXERIL) 10 MG tablet, TAKE 1 TABLET BY MOUTH TWICE A DAY AS NEEDED FOR MUSCLE SPASMS, Disp: 30 tablet, Rfl: 0 .  fish oil-omega-3 fatty acids 1000 MG capsule, Take 1 g by mouth daily. , Disp: , Rfl:  .  Flaxseed, Linseed, (FLAXSEED OIL MAX STR PO), Take 1 capsule by mouth daily. , Disp: , Rfl:  .  guaiFENesin-dextromethorphan (ROBITUSSIN DM) 100-10 MG/5ML syrup,  Take 5 mLs by mouth 3 (three) times daily as needed for cough., Disp: 118 mL, Rfl: 0 .  meloxicam (MOBIC) 7.5 MG tablet, TAKE 1 TABLET BY MOUTH EVERY DAY, Disp: 30 tablet, Rfl: 0 .  Multiple Vitamins-Minerals (MULTIVITAMIN & MINERAL PO), Take 1 tablet by mouth daily., Disp: , Rfl:  .  omeprazole (PRILOSEC) 20 MG capsule, Take 20 mg by mouth daily., Disp: , Rfl:  .  propranolol (INDERAL) 20 MG tablet, TAKE 1 TABLET BY MOUTH EVERY DAY (Patient taking differently: Take 20 mg by mouth daily. ), Disp: 30 tablet, Rfl: 3 .  senna (SENOKOT) 8.6 MG tablet, Take 1 tablet by mouth daily as needed for constipation. , Disp: , Rfl:  .  triamcinolone cream (KENALOG) 0.1 %, Apply 1 application topically 2 (two) times daily. Apply for up to one week. (Patient taking differently: Apply 1 application topically 2 (two) times  daily as needed (rash). ), Disp: 30 g, Rfl: 0 .  UNABLE TO FIND, Rx: L8000-Post Surgical Bra (Quantity: 6) Z7915- Silicone Breast Prosthesis (Quantity: 2) Dx: 174.9; Bilateral partial mastectomy Replacement due to hot flashes since previous items were too heavy and hot, Disp: 1 each, Rfl: 0 .  venlafaxine XR (EFFEXOR-XR) 150 MG 24 hr capsule, Take 1 capsule (150 mg total) by mouth See admin instructions. Nightly with 75 mg capsule, total of 225 mg nightly, Disp: 90 capsule, Rfl: 3 .  venlafaxine XR (EFFEXOR-XR) 75 MG 24 hr capsule, TAKE 1 CAPSULE DAILY ALONG WITH 150MG CAP TO MAKE IT 225MG TOTAL (Patient taking differently: Take 75 mg by mouth See admin instructions. TAKE 1 CAPSULE NIGHTLY ALONG WITH 150MG CAP TO MAKE IT 225MG TOTAL), Disp: 90 capsule, Rfl: 0 .  vitamin E 200 UNIT capsule, Take 200 Units by mouth daily., Disp: , Rfl:  .  doxycycline (VIBRA-TABS) 100 MG tablet, Take 1 tablet (100 mg total) by mouth 2 (two) times daily. (Patient not taking: Reported on 06/06/2018), Disp: 14 tablet, Rfl: 0 .  HYDROcodone-acetaminophen (NORCO) 5-325 MG tablet, Take 1 tablet by mouth every 6 (six) hours as needed for moderate pain. (Patient not taking: Reported on 06/06/2018), Disp: 15 tablet, Rfl: 0 .  HYDROcodone-acetaminophen (NORCO/VICODIN) 5-325 MG tablet, Take 1 tablet by mouth every 6 (six) hours as needed for moderate pain. (Patient not taking: Reported on 05/06/2018), Disp: 30 tablet, Rfl: 0 .  predniSONE (DELTASONE) 5 MG tablet, 6 tab x 1 day, 5 tab x 1 day, 4 tab x 1 day, 3 tab x 1 day, 2 tab x 1 day, 1 tab x 1 day, stop (Patient not taking: Reported on 06/06/2018), Disp: 21 tablet, Rfl: 0 .  prochlorperazine (COMPAZINE) 5 MG tablet, Take 1 tablet (5 mg total) by mouth every 6 (six) hours as needed for nausea or vomiting. (Patient not taking: Reported on 05/06/2018), Disp: 30 tablet, Rfl: 0 .  propranolol ER (INDERAL LA) 80 MG 24 hr capsule, Take 1 capsule (80 mg total) by mouth daily. (Patient not  taking: Reported on 10/21/2017), Disp: , Rfl:  .  SYMBICORT 80-4.5 MCG/ACT inhaler, INHALE 2 PUFF BY INHALATION ROUTE EVERY DAY IN THE MORNING AND EVENING (Patient not taking: No sig reported), Disp: 30.6 Inhaler, Rfl: 1   Allergies  Allergen Reactions  . Aspirin     unknown  . Penicillins Hives and Swelling    Has patient had a PCN reaction causing immediate rash, facial/tongue/throat swelling, SOB or lightheadedness with hypotension: Yes Has patient had a PCN reaction causing severe rash involving  mucus membranes or skin necrosis: Yes Has patient had a PCN reaction that required hospitalization Yes Has patient had a PCN reaction occurring within the last 10 years: No If all of the above answers are "NO", then may proceed with Cephalosporin use.      Review of Systems  Constitutional: Negative.  Negative for fatigue.  Eyes: Negative for visual disturbance.  Respiratory: Negative.  Negative for shortness of breath.   Cardiovascular: Negative.  Negative for chest pain, palpitations and leg swelling.  Gastrointestinal: Negative.   Endocrine: Negative.   Musculoskeletal: Negative.   Skin: Negative.   Neurological: Negative for dizziness, weakness and headaches.  Psychiatric/Behavioral: Negative for confusion. The patient is not nervous/anxious.      Today's Vitals   06/06/18 0943  BP: 140/90  Pulse: 92  Temp: 98.2 F (36.8 C)  TempSrc: Oral  SpO2: 97%  Weight: 292 lb 6.4 oz (132.6 kg)  Height: 5' 2.4" (1.585 m)   Body mass index is 52.8 kg/m.   Objective:  Physical Exam Vitals signs reviewed.  Constitutional:      Appearance: She is well-developed.  HENT:     Head: Normocephalic and atraumatic.  Eyes:     Extraocular Movements: Extraocular movements intact.     Conjunctiva/sclera: Conjunctivae normal.     Pupils: Pupils are equal, round, and reactive to light.  Cardiovascular:     Rate and Rhythm: Normal rate and regular rhythm.     Pulses: Normal pulses.     Heart  sounds: Normal heart sounds. No murmur.  Pulmonary:     Effort: Pulmonary effort is normal.     Breath sounds: Normal breath sounds.  Musculoskeletal: Normal range of motion.  Skin:    General: Skin is warm and dry.     Capillary Refill: Capillary refill takes less than 2 seconds.  Neurological:     General: No focal deficit present.     Mental Status: She is alert and oriented to person, place, and time.     Cranial Nerves: No cranial nerve deficit.  Psychiatric:        Mood and Affect: Mood normal.         Assessment And Plan:     1. Elevated blood-pressure reading without diagnosis of hypertension  This is the second visit with an elevated blood pressure  She has been eating increased foods with high salt content.  Will start HCTZ 12.17m daily, she is advised to increase her potassium intake through food - hydrochlorothiazide (HYDRODIURIL) 12.5 MG tablet; Take 1 tablet (12.5 mg total) by mouth daily.  Dispense: 30 tablet; Refill: 1 - Iron, TIBC and Ferritin Panel  2. Fibromyalgia  Chronic,  Controlled  Discussed with weight loss and exercise this may benefit her fibromyalgia  3. Edema, unspecified type  No current edema to lower extremities however she has had a 12 lb weight gain since her last office visit.     - CBC no Diff - Iron, TIBC and Ferritin Panel - BMP8+eGFR  4. Anemia, unspecified type  Chronic  She has been eating more ice recently  Will check iron studies  - Iron, TIBC and Ferritin Panel    JMinette Brine FNP

## 2018-06-07 LAB — CBC
Hematocrit: 31.5 % — ABNORMAL LOW (ref 34.0–46.6)
Hemoglobin: 9.6 g/dL — ABNORMAL LOW (ref 11.1–15.9)
MCH: 22.7 pg — ABNORMAL LOW (ref 26.6–33.0)
MCHC: 30.5 g/dL — ABNORMAL LOW (ref 31.5–35.7)
MCV: 75 fL — ABNORMAL LOW (ref 79–97)
Platelets: 365 10*3/uL (ref 150–450)
RBC: 4.22 x10E6/uL (ref 3.77–5.28)
RDW: 15.2 % (ref 11.7–15.4)
WBC: 8.3 10*3/uL (ref 3.4–10.8)

## 2018-06-07 LAB — BMP8+EGFR
BUN/Creatinine Ratio: 14 (ref 9–23)
BUN: 9 mg/dL (ref 6–24)
CO2: 27 mmol/L (ref 20–29)
Calcium: 9.1 mg/dL (ref 8.7–10.2)
Chloride: 99 mmol/L (ref 96–106)
Creatinine, Ser: 0.66 mg/dL (ref 0.57–1.00)
GFR calc Af Amer: 112 mL/min/{1.73_m2} (ref >59)
GFR calc non Af Amer: 97 mL/min/{1.73_m2} (ref >59)
Glucose: 91 mg/dL (ref 65–99)
Potassium: 5.1 mmol/L (ref 3.5–5.2)
Sodium: 139 mmol/L (ref 134–144)

## 2018-06-07 LAB — IRON,TIBC AND FERRITIN PANEL
Ferritin: 18 ng/mL (ref 15–150)
Iron Saturation: 9 % — CL (ref 15–55)
Iron: 35 ug/dL (ref 27–159)
Total Iron Binding Capacity: 378 ug/dL (ref 250–450)
UIBC: 343 ug/dL (ref 131–425)

## 2018-06-14 ENCOUNTER — Other Ambulatory Visit: Payer: Self-pay | Admitting: Nurse Practitioner

## 2018-06-16 ENCOUNTER — Telehealth: Payer: Self-pay

## 2018-06-16 DIAGNOSIS — I890 Lymphedema, not elsewhere classified: Secondary | ICD-10-CM

## 2018-06-16 NOTE — Telephone Encounter (Signed)
Nurse returned patient's call.  Pt continues with swelling to left arm and axillary area since beginning of year per patient.  Pt reports pain from swelling in left breast/nipple area where she had lumpectomy and lymph nodes removed.    Upon phone assessment pt is using sleeve as much as she can tolerate.  Using heating pads and OTC analgesic that help relief pain temporarily.  Encouraged massage to site and per recommendations we will initiate new referral to PT for swelling.  Pt voiced understanding and agreement.

## 2018-06-20 ENCOUNTER — Other Ambulatory Visit: Payer: Self-pay | Admitting: Nurse Practitioner

## 2018-06-23 ENCOUNTER — Other Ambulatory Visit: Payer: Self-pay | Admitting: Nurse Practitioner

## 2018-06-23 MED ORDER — FUSION PLUS PO CAPS: 1.0000 | ORAL_CAPSULE | Freq: Every day | ORAL | 3 refills | Status: DC

## 2018-06-23 NOTE — Progress Notes (Signed)
Have her to come to the office for iron supplement sample of fusion plus. Will send rx to pharmacy, she will need to follow up in 3 months

## 2018-06-25 ENCOUNTER — Ambulatory Visit: Payer: Medicare Other | Admitting: Rehabilitation

## 2018-06-30 ENCOUNTER — Other Ambulatory Visit: Payer: Self-pay | Admitting: Nurse Practitioner

## 2018-06-30 DIAGNOSIS — R030 Elevated blood-pressure reading, without diagnosis of hypertension: Secondary | ICD-10-CM

## 2018-07-04 ENCOUNTER — Other Ambulatory Visit: Payer: Self-pay | Admitting: Nurse Practitioner

## 2018-07-07 NOTE — Telephone Encounter (Signed)
Can pt have a refill on this 

## 2018-07-09 ENCOUNTER — Other Ambulatory Visit: Payer: Self-pay | Admitting: Medical

## 2018-07-09 NOTE — Telephone Encounter (Signed)
Please have her connect with her PCP to see if she still needs the albuterol

## 2018-07-09 NOTE — Telephone Encounter (Signed)
Dr. Lindi Adie, please advise refill.

## 2018-07-10 ENCOUNTER — Telehealth: Payer: Self-pay

## 2018-07-10 NOTE — Telephone Encounter (Signed)
Called pt to follow up on her triage call, regarding pain in her R arm. LVM with call back number.

## 2018-07-10 NOTE — Telephone Encounter (Signed)
Ashley Lindsey reports receiving refill from PCP today for Albuterol inhaler today.

## 2018-07-11 ENCOUNTER — Encounter: Payer: Self-pay | Admitting: *Deleted

## 2018-07-11 NOTE — Telephone Encounter (Signed)
Late entry for 07-10-2018. Breast problem, request for appointment conveyed to this nurse calling about albuterol refill.  "Ashley Lindsey [phone]).  No return call from scheduling with appointment to see Dr. Lindi Adie.   I now have NEW area under arm where right breast incision is."     "PCP told me to double up on muscle relaxant Flexeril 10 mg (now twice daily) until I am seen by Dr Lindi Adie or the female NP or PA I have seen there.    I need to be seen.  What I do, doesn't do anything except pacify it.  Apply heat pad, cold, take a chill pill, flexeril and wearing sleeve if I have swelling.    Dealing with this off and on for a few months now.  Cold temperatures nor dampness have any effect on this pain.   Can't pay pay or be billed later for physical therapy.  Therapist wanted me to come twice a week.  Insurance would not decrease cost.  Paying $35.00 each visit.  Wanted me to just do exercises.  Nothing was placed on my body.  No equipment was used.  Only did range of motion exercises."    Conveyed goal of therapy exercises are to help improve the flow of lymph fluid in the arm and compression sleeve helps reduce swelling.  Denies diuretic use.  HCTZ for B/P per medication list.

## 2018-07-11 NOTE — Telephone Encounter (Signed)
No new note. 

## 2018-07-12 ENCOUNTER — Other Ambulatory Visit: Payer: Self-pay | Admitting: Nurse Practitioner

## 2018-07-12 DIAGNOSIS — M797 Fibromyalgia: Secondary | ICD-10-CM

## 2018-07-14 ENCOUNTER — Other Ambulatory Visit: Payer: Self-pay | Admitting: Hematology and Oncology

## 2018-07-14 NOTE — Telephone Encounter (Signed)
Meloxicam refill 

## 2018-07-16 ENCOUNTER — Telehealth: Payer: Self-pay | Admitting: *Deleted

## 2018-07-16 NOTE — Telephone Encounter (Signed)
Received a call from pt stating that she would like to see Dr. Lindi Adie before her apt in October.  Pt states that she has called the office several times now with the same complaint.  Pt states she is experiencing pain where her right breast incision is.  Pt states she has tried to do physical therapy but could not afford to continue to go.  Pt states that she also wears her sleeve with no relief.  Pt states that "this site is starting to feel like a brick".  She continues to use a heating pad with minimal relief.  Pt also states she is taking 10 mg of flexeril and Meloxicam with minimal releaf as well, pt states they work for about 4 hours and the pain returns.  She is wanting to see Dr. Lindi Adie for him to assess the area. pt states she can not come into the office this week due to transportation issues but can come in next week and would like to come in on 07/22/2018.  Apt made for pt on 07/22/2018 at 10:45 am and message sent to scheduling.  Pt verbalized understanding of apt and to call us if she experiences any worsening symptoms.

## 2018-07-17 ENCOUNTER — Other Ambulatory Visit: Payer: Self-pay | Admitting: Medical

## 2018-07-17 MED ORDER — ALBUTEROL SULFATE HFA 108 (90 BASE) MCG/ACT IN AERS: 1.0000 | INHALATION_SPRAY | Freq: Four times a day (QID) | RESPIRATORY_TRACT | 1 refills | Status: DC | PRN

## 2018-07-19 ENCOUNTER — Other Ambulatory Visit: Payer: Self-pay | Admitting: Nurse Practitioner

## 2018-07-22 ENCOUNTER — Ambulatory Visit: Payer: Medicare Other | Admitting: Hematology and Oncology

## 2018-07-22 NOTE — Assessment & Plan Note (Deleted)
Left breast invasive ductal carcinoma grade 1 ER 77% PR 96% gases on 5% HER-2 negative, T1 AN0M0 stage I A. status post lumpectomy 2.7 2009 followed by radiation therapy completed 10/10/2007, tamoxifen started September 2009 discontinued March 2012 followed by recurrence in the right breast as DCIS 05/26/2010 completed another lumpectomy and radiation therapy and she has been on tamoxifen ever since.  Insufficient tissue for BCI Completed Tamoxifen July 2017  Breast Cancer Surveillance: Mammogram8/29/19No abnormalities. Postsurgical changes. Breast Density Category C.   New pain and discomfort under the left arm: Examination  Severe fibromyalgia related pains: She is seeing a fibromyalgia specialist.

## 2018-07-24 ENCOUNTER — Encounter: Payer: PPO | Admitting: Nurse Practitioner

## 2018-07-24 ENCOUNTER — Ambulatory Visit: Payer: PPO

## 2018-07-24 ENCOUNTER — Encounter: Payer: Self-pay | Admitting: General Practice

## 2018-07-24 NOTE — Progress Notes (Unsigned)
[place] Team contacted patient to assess for food insecurity and other psychosocial needs during current COVID19 pandemic.  No concerns at this time, does not want to go out.    Patient/family expressed no needs at this time.  Support Team member encouraged patient to call if changes occur or they have any other questions/concerns.   Beverely Pace, [place]

## 2018-07-25 ENCOUNTER — Telehealth: Payer: Self-pay | Admitting: *Deleted

## 2018-07-25 NOTE — Telephone Encounter (Signed)
error 

## 2018-07-29 ENCOUNTER — Telehealth: Payer: Self-pay | Admitting: *Deleted

## 2018-07-29 NOTE — Telephone Encounter (Signed)
Pt experiencing breast pain and lymphedema.  Per Dr. Lindi Adie, pt needs to follow up with breast surgeon to assess the underlying issue if it is breast pain caused by lymphedema or fibromyalgia.  I called pt to inform her that she would need to follow up with her breast surgeon.  Pt verbalized understanding and stated she would call once the covid 19 issues we over.

## 2018-07-30 ENCOUNTER — Other Ambulatory Visit: Payer: Self-pay | Admitting: Nurse Practitioner

## 2018-07-30 DIAGNOSIS — R030 Elevated blood-pressure reading, without diagnosis of hypertension: Secondary | ICD-10-CM

## 2018-08-05 ENCOUNTER — Encounter: Payer: Self-pay | Admitting: Nurse Practitioner

## 2018-08-05 ENCOUNTER — Other Ambulatory Visit: Payer: Self-pay

## 2018-08-05 ENCOUNTER — Ambulatory Visit (INDEPENDENT_AMBULATORY_CARE_PROVIDER_SITE_OTHER): Payer: Medicare Other | Admitting: Nurse Practitioner

## 2018-08-05 ENCOUNTER — Ambulatory Visit: Payer: Medicare Other

## 2018-08-05 VITALS — BP 155/62

## 2018-08-05 DIAGNOSIS — M797 Fibromyalgia: Secondary | ICD-10-CM | POA: Diagnosis not present

## 2018-08-05 DIAGNOSIS — G8929 Other chronic pain: Secondary | ICD-10-CM

## 2018-08-05 DIAGNOSIS — F329 Major depressive disorder, single episode, unspecified: Secondary | ICD-10-CM

## 2018-08-05 DIAGNOSIS — F32A Depression, unspecified: Secondary | ICD-10-CM | POA: Insufficient documentation

## 2018-08-05 DIAGNOSIS — R51 Headache: Secondary | ICD-10-CM | POA: Diagnosis not present

## 2018-08-05 DIAGNOSIS — Z6841 Body Mass Index (BMI) 40.0 and over, adult: Secondary | ICD-10-CM | POA: Insufficient documentation

## 2018-08-05 DIAGNOSIS — R519 Headache, unspecified: Secondary | ICD-10-CM | POA: Insufficient documentation

## 2018-08-05 MED ORDER — PROPRANOLOL HCL 20 MG PO TABS: 20.0000 mg | ORAL_TABLET | Freq: Every day | ORAL | 1 refills | Status: DC

## 2018-08-05 MED ORDER — CYCLOBENZAPRINE HCL 10 MG PO TABS: 10.0000 mg | ORAL_TABLET | Freq: Three times a day (TID) | ORAL | 1 refills | Status: DC | PRN

## 2018-08-05 NOTE — Progress Notes (Signed)
This visit type was conducted due to national recommendations for restrictions regarding the COVID-19 Pandemic (e.g. social distancing). This format is felt to be most appropriate for this patient at this time.  All issues noted in this document were discussed and addressed.  No physical exam was performed (except for noted visual exam findings with Video Visits).  Please refer to the patient's chart (MyChart message for video visits and phone note for telephone visits) for the patient's consent to telehealth for [facility].   Subjective:     Patient ID: Ashley Lindsey , female    DOB: [birth year] , 58 y.o.   MRN: [id number]  Virtual Visit via [facility] Note  I connected with Ashley Lindsey on 08/05/18 at 11:00 AM EDT by telephone and verified that I am speaking with the correct person using two identifiers.   I discussed the limitations, risks, security and privacy concerns of performing an evaluation and management service by telephone and the availability of in person appointments. I also discussed with the patient that there may be a patient responsible charge related to this service. The patient expressed understanding and agreed to proceed.  Patient Location: Home  Provider Location: Office/Home Chief Complaint  Patient presents with  . Hypertension    History of Present Illness:   Obesity - she is going to [facility] and will be set up with a sleep study. She is doing telehealth.    Hypertension  This is a chronic problem. The current episode started more than 1 year ago. The problem is unchanged. The problem is uncontrolled. Pertinent negatives include no anxiety, blurred vision, chest pain, headaches or palpitations. Risk factors for coronary artery disease include obesity and sedentary lifestyle. Past treatments include diuretics. The current treatment provides mild improvement. Compliance problems include diet.  There is no history of CVA. There is no history of chronic renal disease.   Headache   Pertinent negatives include no blurred vision, dizziness, nausea or vomiting. Her past medical history is significant for hypertension.    Past Medical History:  Diagnosis Date  . Abdominal cramps   . Arthritis   . Cancer [facility])    s/p radiation- last dose 6/12//has been off Tamoxifen 8/12  . Chronic headaches   . Costochondritis   . Fatigue   . Fibromyalgia 03/06/2013  . Lymphedema    RIGHT ARM-////  STATES USE LEFT ARM FOR BP'S  . Migraines   . Muscle spasms of head and/or neck    following to the breast  . Passed out    4th of july  . Personal history of radiation therapy 2010   lt breast   . S/P radiation therapy 08/19/07 - 10/10/07   Left Breast/5040 cGy/28 fractions with Boost for a toatl dose of 6300 dGy  . S/P radiation therapy 08/14/10 -10/03/10   right breast  . Sleep apnea    STOP BANG SCORE 5     Family History  Problem Relation Age of Onset  . Breast cancer Mother 56  . Heart disease Father   . Prostate cancer Maternal Uncle 78  . Cancer Maternal Aunt 63       d. from "female cancer" possibly cervical  . Breast cancer Maternal Aunt 60  . Prostate cancer Paternal Uncle   . Prostate cancer Maternal Grandfather 12  . Prostate cancer Maternal Uncle 80  . Prostate cancer Paternal Uncle   . Breast cancer Cousin 30       mat 1st cousin  Current Outpatient Medications:  .  albuterol (PROAIR HFA) 108 (90 Base) MCG/ACT inhaler, Inhale 1-2 puffs into the lungs every 6 (six) hours as needed for wheezing or shortness of breath., Disp: 1 Inhaler, Rfl: 1 .  Bisacodyl (DULCOLAX PO), Take 1 capsule by mouth daily as needed (constipation). , Disp: , Rfl:  .  cholecalciferol 5000 units TABS, Take 1 tablet (5,000 Units total) by mouth 2 (two) times daily. (Patient taking differently: Take 5,000 Units by mouth daily. ), Disp: , Rfl:  .  cyclobenzaprine (FLEXERIL) 10 MG tablet, TAKE 1 TABLET BY MOUTH TWICE A DAY AS NEEDED FOR MUSCLE SPASMS, Disp: 30 tablet, Rfl:  0 .  fish oil-omega-3 fatty acids 1000 MG capsule, Take 1 g by mouth daily. , Disp: , Rfl:  .  Flaxseed, Linseed, (FLAXSEED OIL MAX STR PO), Take 1 capsule by mouth daily. , Disp: , Rfl:  .  hydrochlorothiazide (HYDRODIURIL) 12.5 MG tablet, TAKE 1 TABLET BY MOUTH EVERY DAY, Disp: 30 tablet, Rfl: 1 .  Iron-FA-B Cmp-C-Biot-Probiotic (FUSION PLUS) CAPS, Take 1 capsule by mouth daily., Disp: 30 capsule, Rfl: 3 .  meloxicam (MOBIC) 7.5 MG tablet, TAKE 1 TABLET BY MOUTH EVERY DAY, Disp: 30 tablet, Rfl: 3 .  Multiple Vitamins-Minerals (MULTIVITAMIN & MINERAL PO), Take 1 tablet by mouth daily., Disp: , Rfl:  .  omeprazole (PRILOSEC) 20 MG capsule, Take 20 mg by mouth daily., Disp: , Rfl:  .  propranolol (INDERAL) 20 MG tablet, TAKE 1 TABLET BY MOUTH EVERY DAY, Disp: 30 tablet, Rfl: 3 .  senna (SENOKOT) 8.6 MG tablet, Take 1 tablet by mouth daily as needed for constipation. , Disp: , Rfl:  .  SYMBICORT 80-4.5 MCG/ACT inhaler, INHALE 2 PUFF BY INHALATION ROUTE EVERY DAY IN THE MORNING AND EVENING, Disp: 30.6 Inhaler, Rfl: 1 .  triamcinolone cream (KENALOG) 0.1 %, Apply 1 application topically 2 (two) times daily. Apply for up to one week. (Patient taking differently: Apply 1 application topically 2 (two) times daily as needed (rash). ), Disp: 30 g, Rfl: 0 .  UNABLE TO FIND, Rx: L8000-Post Surgical Bra (Quantity: 6) J0932- Silicone Breast Prosthesis (Quantity: 2) Dx: 174.9; Bilateral partial mastectomy Replacement due to hot flashes since previous items were too heavy and hot, Disp: 1 each, Rfl: 0 .  venlafaxine XR (EFFEXOR-XR) 150 MG 24 hr capsule, Take 1 capsule (150 mg total) by mouth See admin instructions. Nightly with 75 mg capsule, total of 225 mg nightly, Disp: 90 capsule, Rfl: 3 .  venlafaxine XR (EFFEXOR-XR) 75 MG 24 hr capsule, TAKE 1 CAPSULE DAILY ALONG WITH 150MG  CAP TO MAKE IT 225MG  TOTAL, Disp: 90 capsule, Rfl: 0 .  vitamin E 200 UNIT capsule, Take 200 Units by mouth daily., Disp: , Rfl:  .   doxycycline (VIBRA-TABS) 100 MG tablet, Take 1 tablet (100 mg total) by mouth 2 (two) times daily. (Patient not taking: Reported on 06/06/2018), Disp: 14 tablet, Rfl: 0 .  guaiFENesin-dextromethorphan (ROBITUSSIN DM) 100-10 MG/5ML syrup, Take 5 mLs by mouth 3 (three) times daily as needed for cough. (Patient not taking: Reported on 08/05/2018), Disp: 118 mL, Rfl: 0 .  HYDROcodone-acetaminophen (NORCO) 5-325 MG tablet, Take 1 tablet by mouth every 6 (six) hours as needed for moderate pain. (Patient not taking: Reported on 06/06/2018), Disp: 15 tablet, Rfl: 0 .  HYDROcodone-acetaminophen (NORCO/VICODIN) 5-325 MG tablet, Take 1 tablet by mouth every 6 (six) hours as needed for moderate pain. (Patient not taking: Reported on 05/06/2018), Disp: 30 tablet, Rfl: 0 .  predniSONE (DELTASONE) 5 MG tablet, 6 tab x 1 day, 5 tab x 1 day, 4 tab x 1 day, 3 tab x 1 day, 2 tab x 1 day, 1 tab x 1 day, stop (Patient not taking: Reported on 06/06/2018), Disp: 21 tablet, Rfl: 0 .  prochlorperazine (COMPAZINE) 5 MG tablet, Take 1 tablet (5 mg total) by mouth every 6 (six) hours as needed for nausea or vomiting. (Patient not taking: Reported on 05/06/2018), Disp: 30 tablet, Rfl: 0 .  propranolol ER (INDERAL LA) 80 MG 24 hr capsule, Take 1 capsule (80 mg total) by mouth daily. (Patient not taking: Reported on 10/21/2017), Disp: , Rfl:    Allergies  Allergen Reactions  . Aspirin     unknown  . Penicillins Hives and Swelling    Has patient had a PCN reaction causing immediate rash, facial/tongue/throat swelling, SOB or lightheadedness with hypotension: Yes Has patient had a PCN reaction causing severe rash involving mucus membranes or skin necrosis: Yes Has patient had a PCN reaction that required hospitalization Yes Has patient had a PCN reaction occurring within the last 10 years: No If all of the above answers are "NO", then may proceed with Cephalosporin use.      Review of Systems  Constitutional: Negative.   Eyes:  Negative for blurred vision.  Respiratory: Negative.   Cardiovascular: Negative.  Negative for chest pain, palpitations and leg swelling.  Gastrointestinal: Negative for nausea and vomiting.   Musculoskeletal: Negative.   Skin: Negative.   Neurological: Negative for dizziness and headaches.     Today's Vitals   08/05/18 1103  BP: (!) 155/62    Observations/Objective: Unable to visualize patient due to she is having difficulty with her camera.          Assessment and Plan: 1. Class 3 severe obesity due to excess calories without serious comorbidity with body mass index (BMI) of 40.0 to 44.9 in adult [facility])  Chronic  Discussed healthy diet and regular exercise options   Encouraged to exercise at least 150 minutes per week with 2 days of strength training  She is starting to go to [facility] - propranolol (INDERAL) 20 MG tablet; Take 1 tablet (20 mg total) by mouth daily.  Dispense: 90 tablet; Refill: 1  2. Depression, unspecified depression type  She tells me she needs another referral to a counselor reports feeling depressed.  She may get some help from the weight loss center as well - Ambulatory referral to Psychology - propranolol (INDERAL) 20 MG tablet; Take 1 tablet (20 mg total) by mouth daily.  Dispense: 90 tablet; Refill: 1  3. Chronic nonintractable headache, unspecified headache type  Intermittent headaches  Continue with propanolol  Continue to focus on stress relief - propranolol (INDERAL) 20 MG tablet; Take 1 tablet (20 mg total) by mouth daily.  Dispense: 90 tablet; Refill: 1  4. Fibromyalgia  Chronic, stable. - cyclobenzaprine (FLEXERIL) 10 MG tablet; Take 1 tablet (10 mg total) by mouth 3 (three) times daily as needed for muscle spasms.  Dispense: 30 tablet; Refill: 1  Follow Up Instructions: - follow up in 3 months  I discussed the assessment and treatment plan with the patient. The patient was provided an opportunity  to ask questions and all were answered. The patient agreed with the plan and demonstrated an understanding of the instructions.   The patient was advised to call back or seek an in-person evaluation if the symptoms worsen or if the condition fails  to improve as anticipated.  COVID-19 Education: The signs and symptoms of COVID-19 were discussed with the patient and how to seek care for testing (follow up with PCP or arrange E-visit).  The importance of social distancing was discussed today.   Patient Risk:   After full review of this patients clinical status, I feel that they are at least moderate risk at this time.   I provided 15 minutes of non-face-to-face time during this encounter.   [name], FNP

## 2018-08-12 ENCOUNTER — Ambulatory Visit: Payer: Self-pay

## 2018-08-25 ENCOUNTER — Other Ambulatory Visit: Payer: Self-pay | Admitting: Nurse Practitioner

## 2018-08-25 DIAGNOSIS — R030 Elevated blood-pressure reading, without diagnosis of hypertension: Secondary | ICD-10-CM

## 2018-08-30 ENCOUNTER — Other Ambulatory Visit: Payer: Self-pay | Admitting: Hematology and Oncology

## 2018-09-01 ENCOUNTER — Other Ambulatory Visit: Payer: Self-pay | Admitting: Nurse Practitioner

## 2018-09-01 DIAGNOSIS — R030 Elevated blood-pressure reading, without diagnosis of hypertension: Secondary | ICD-10-CM

## 2018-09-24 DIAGNOSIS — C50911 Malignant neoplasm of unspecified site of right female breast: Secondary | ICD-10-CM | POA: Diagnosis not present

## 2018-10-03 ENCOUNTER — Telehealth: Payer: Self-pay

## 2018-10-03 NOTE — Telephone Encounter (Signed)
Patient called stating she was in the hospital back in January and was told she had pneumonia and was given antibotics. Patient wanted to know if you felt she needed to be test for the coronavirus. I told pt she does not need the test because she is not having symptoms and this was back in January so if she did she has probably recovered from it already I also offered pt the antibodies test to see if she has had the virus at some point and let her know that she does have to pay for the test to be done pt declined she stated she did not have the money for it. Patient also confirmed her appointment with me on 06/18. YRL,RMA

## 2018-10-04 ENCOUNTER — Other Ambulatory Visit: Payer: Self-pay | Admitting: Hematology and Oncology

## 2018-10-07 DIAGNOSIS — C50911 Malignant neoplasm of unspecified site of right female breast: Secondary | ICD-10-CM | POA: Diagnosis not present

## 2018-10-08 ENCOUNTER — Ambulatory Visit: Payer: Medicare Other | Admitting: Nurse Practitioner

## 2018-10-09 ENCOUNTER — Other Ambulatory Visit: Payer: Self-pay

## 2018-10-09 ENCOUNTER — Encounter: Payer: Self-pay | Admitting: Nurse Practitioner

## 2018-10-09 ENCOUNTER — Ambulatory Visit: Payer: Medicare Other

## 2018-10-09 ENCOUNTER — Ambulatory Visit (INDEPENDENT_AMBULATORY_CARE_PROVIDER_SITE_OTHER): Payer: Medicare Other | Admitting: Nurse Practitioner

## 2018-10-09 ENCOUNTER — Ambulatory Visit (INDEPENDENT_AMBULATORY_CARE_PROVIDER_SITE_OTHER): Payer: Medicare Other

## 2018-10-09 VITALS — BP 120/78 | HR 88 | Temp 98.0°F | Ht 61.2 in | Wt 289.4 lb

## 2018-10-09 VITALS — BP 120/78 | HR 88 | Temp 98.0°F | Ht 61.2 in | Wt 289.0 lb

## 2018-10-09 DIAGNOSIS — Z1159 Encounter for screening for other viral diseases: Secondary | ICD-10-CM

## 2018-10-09 DIAGNOSIS — Z23 Encounter for immunization: Secondary | ICD-10-CM

## 2018-10-09 DIAGNOSIS — Z6841 Body Mass Index (BMI) 40.0 and over, adult: Secondary | ICD-10-CM | POA: Diagnosis not present

## 2018-10-09 DIAGNOSIS — D649 Anemia, unspecified: Secondary | ICD-10-CM | POA: Diagnosis not present

## 2018-10-09 DIAGNOSIS — R030 Elevated blood-pressure reading, without diagnosis of hypertension: Secondary | ICD-10-CM

## 2018-10-09 DIAGNOSIS — Z0000 Encounter for general adult medical examination without abnormal findings: Secondary | ICD-10-CM | POA: Diagnosis not present

## 2018-10-09 MED ORDER — TETANUS-DIPHTH-ACELL PERTUSSIS 5-2-15.5 LF-MCG/0.5 IM SUSP: 0.5000 mL | Freq: Once | INTRAMUSCULAR | 0 refills | Status: DC

## 2018-10-09 NOTE — Progress Notes (Signed)
Subjective:   Ashley Lindsey is a 58 y.o. female who presents for Medicare Annual (Subsequent) preventive examination.  Review of Systems:  n/a Cardiac Risk Factors include: obesity (BMI >30kg/m2);hypertension     Objective:     Vitals: BP 120/78 (BP Location: Left Arm, Patient Position: Sitting, Cuff Size: Large)   Pulse 88   Temp 98 F (36.7 C) (Oral)   Ht 5' 1.2" (1.554 m)   Wt 289 lb 6.4 oz (131.3 kg)   SpO2 96%   BMI 54.32 kg/m   Body mass index is 54.32 kg/m.  Advanced Directives 10/09/2018 05/06/2018 03/03/2018 10/21/2017 09/20/2017 09/15/2016 09/07/2016  Does Patient Have a Medical Advance Directive? No No No Yes No No No  Type of Advance Directive - - - [place]  Does patient want to make changes to medical advance directive? - - - - - - -  Copy of [place] in Chart? - - - No - copy requested - - -  Would patient like information on creating a medical advance directive? - No - Patient declined - - - No - Patient declined -  Pre-existing out of facility DNR order (yellow form or pink MOST form) - - - - - - -    Tobacco Social History   Tobacco Use  Smoking Status Former Smoker  . Packs/day: 0.25  . Years: 1.00  . Pack years: 0.25  . Types: Cigarettes  Smokeless Tobacco Never Used     Counseling given: Not Answered   Clinical Intake:  Pre-visit preparation completed: Yes  Pain : 0-10 Pain Score: 7  Pain Type: Chronic pain Pain Location: Knee Pain Orientation: Left, Right Pain Radiating Towards: down to feet Pain Descriptors / Indicators: Sharp Pain Onset: 1 to 4 weeks ago Pain Frequency: Constant(when walks) Pain Relieving Factors: rest helps with elevation Effect of Pain on Daily Activities: slows down  Pain Relieving Factors: rest helps with elevation              Past Medical History:  Diagnosis Date  . Abdominal cramps   . Arthritis   . Cancer [facility])    s/p radiation- last dose 6/12//has  been off Tamoxifen 8/12  . Chronic headaches   . Costochondritis   . Fatigue   . Fibromyalgia 03/06/2013  . Lymphedema    RIGHT ARM-////  STATES USE LEFT ARM FOR BP'S  . Migraines   . Muscle spasms of head and/or neck    following to the breast  . Passed out    4th of july  . Personal history of radiation therapy 2010   lt breast   . S/P radiation therapy 08/19/07 - 10/10/07   Left Breast/5040 cGy/28 fractions with Boost for a toatl dose of 6300 dGy  . S/P radiation therapy 08/14/10 -10/03/10   right breast  . Sleep apnea    STOP BANG SCORE 5   Past Surgical History:  Procedure Laterality Date  . ABDOMINAL HYSTERECTOMY     with left salpingooophorectomy  . BREAST LUMPECTOMY  2009/2012   LEFT/RIGHT with lymph node dissection  . KNEE ARTHROSCOPY     right  . OOPHORECTOMY  2013   Family History  Problem Relation Age of Onset  . Breast cancer Mother 69  . Heart disease Father   . Prostate cancer Maternal Uncle 78  . Cancer Maternal Aunt 63       d. from "female cancer" possibly cervical  . Breast  cancer Maternal Aunt 60  . Prostate cancer Paternal Uncle   . Prostate cancer Maternal Grandfather 30  . Prostate cancer Maternal Uncle 80  . Prostate cancer Paternal Uncle   . Breast cancer Cousin 55       mat 1st cousin   Social History   Socioeconomic History  . Marital status: Divorced    Spouse name: Not on file  . Number of children: Not on file  . Years of education: Not on file  . Highest education level: Not on file  Occupational History  . Occupation: disability  Social Needs  . Financial resource strain: Not on file  . Food insecurity    Worry: Never true    Inability: Never true  . Transportation needs    Medical: No    Non-medical: No  Tobacco Use  . Smoking status: Former Smoker    Packs/day: 0.25    Years: 1.00    Pack years: 0.25    Types: Cigarettes  . Smokeless tobacco: Never Used  Substance and Sexual Activity  . Alcohol use: Not Currently   . Drug use: No  . Sexual activity: Not Currently    Birth control/protection: Surgical  Lifestyle  . Physical activity    Days per week: 7 days    Minutes per session: 30 min  . Stress: Very much  Relationships  . Social Herbalist on phone: Not on file    Gets together: Not on file    Attends religious service: Not on file    Active member of club or organization: Not on file    Attends meetings of clubs or organizations: Not on file    Relationship status: Not on file  Other Topics Concern  . Not on file  Social History Narrative  . Not on file    Outpatient Encounter Medications as of 10/09/2018  Medication Sig  . acetaminophen (TYLENOL) 650 MG CR tablet Take 650 mg by mouth every 8 (eight) hours as needed for pain.  Marland Kitchen albuterol (PROAIR HFA) 108 (90 Base) MCG/ACT inhaler Inhale 1-2 puffs into the lungs every 6 (six) hours as needed for wheezing or shortness of breath.  . Bisacodyl (DULCOLAX PO) Take 1 capsule by mouth daily as needed (constipation).   . cholecalciferol 5000 units TABS Take 1 tablet (5,000 Units total) by mouth 2 (two) times daily. (Patient taking differently: Take 5,000 Units by mouth daily. )  . cyclobenzaprine (FLEXERIL) 10 MG tablet Take 1 tablet (10 mg total) by mouth 3 (three) times daily as needed for muscle spasms.  . diphenhydrAMINE (BENADRYL) 25 mg capsule Take 25 mg by mouth 2 (two) times daily.  . fish oil-omega-3 fatty acids 1000 MG capsule Take 1 g by mouth daily.   . Flaxseed, Linseed, (FLAXSEED OIL MAX STR PO) Take 1 capsule by mouth daily.   . hydrochlorothiazide (HYDRODIURIL) 12.5 MG tablet TAKE 1 TABLET BY MOUTH EVERY DAY  . Iron-FA-B Cmp-C-Biot-Probiotic (FUSION PLUS) CAPS Take 1 capsule by mouth daily.  . Melatonin 5 MG TABS Take 1 tablet by mouth.  . meloxicam (MOBIC) 7.5 MG tablet TAKE 1 TABLET BY MOUTH EVERY DAY  . Multiple Vitamins-Minerals (MULTIVITAMIN & MINERAL PO) Take 1 tablet by mouth daily.  Marland Kitchen omeprazole (PRILOSEC) 20  MG capsule Take 20 mg by mouth daily.  . propranolol (INDERAL) 20 MG tablet Take 1 tablet (20 mg total) by mouth daily.  Marland Kitchen senna (SENOKOT) 8.6 MG tablet Take 1 tablet by mouth daily as  needed for constipation.   . SYMBICORT 80-4.5 MCG/ACT inhaler INHALE 2 PUFF BY INHALATION ROUTE EVERY DAY IN THE MORNING AND EVENING  . triamcinolone cream (KENALOG) 0.1 % Apply 1 application topically 2 (two) times daily. Apply for up to one week. (Patient taking differently: Apply 1 application topically 2 (two) times daily as needed (rash). )  . UNABLE TO FIND Rx: L8000-Post Surgical Bra (Quantity: 6) Q7619- Silicone Breast Prosthesis (Quantity: 2) Dx: 174.9; Bilateral partial mastectomy Replacement due to hot flashes since previous items were too heavy and hot  . venlafaxine XR (EFFEXOR-XR) 150 MG 24 hr capsule Take 1 capsule (150 mg total) by mouth See admin instructions. Nightly with 75 mg capsule, total of 225 mg nightly  . venlafaxine XR (EFFEXOR-XR) 75 MG 24 hr capsule TAKE 1 CAPSULE DAILY ALONG WITH 150MG  CAP TO MAKE IT 225MG  TOTAL  . vitamin E 200 UNIT capsule Take 200 Units by mouth daily.  Marland Kitchen guaiFENesin-dextromethorphan (ROBITUSSIN DM) 100-10 MG/5ML syrup Take 5 mLs by mouth 3 (three) times daily as needed for cough. (Patient not taking: Reported on 08/05/2018)  . [DISCONTINUED] Tdap (ADACEL) 08-22-13.5 LF-MCG/0.5 injection Inject 0.5 mLs into the muscle once for 1 dose.   No facility-administered encounter medications on file as of 10/09/2018.     Activities of Daily Living In your present state of health, do you have any difficulty performing the following activities: 10/09/2018  Hearing? N  Vision? N  Difficulty concentrating or making decisions? Y  Comment writes things done  Walking or climbing stairs? Y  Dressing or bathing? N  Doing errands, shopping? N  Preparing Food and eating ? N  Using the Toilet? N  In the past six months, have you accidently leaked urine? Y  Comment wears a pad  Do  you have problems with loss of bowel control? N  Managing your Medications? N  Managing your Finances? N  Housekeeping or managing your Housekeeping? N  Some recent data might be hidden    Patient Care Team: [name], FNP as PCP - General (General Practice)    Assessment:   This is a routine wellness examination for Rudine.  Exercise Activities and Dietary recommendations Current Exercise Habits: Home exercise routine, Time (Minutes): 30, Frequency (Times/Week): 7, Weekly Exercise (Minutes/Week): 210  Goals    . Exercise 150 min/wk Moderate Activity     To find exercise to do in the house       Fall Risk Fall Risk  10/09/2018 08/05/2018 03/06/2018 02/11/2014  Falls in the past year? 0 0 1 No  Number falls in past yr: - - 0 -  Injury with Fall? - - 0 -  Risk for fall due to : Medication side effect - - -  Follow up Education provided;Falls prevention discussed - - -   Is the patient's home free of loose throw rugs in walkways, pet beds, electrical cords, etc?   yes      Grab bars in the bathroom? no      Handrails on the stairs?   yes      Adequate lighting?   yes  Timed Get Up and Go performed: n/a  Depression Screen PHQ 2/9 Scores 10/09/2018 08/05/2018 06/06/2018 03/06/2018  PHQ - 2 Score 2 0 0 0  PHQ- 9 Score 6 - - -     Cognitive Function     6CIT Screen 10/09/2018  What Year? 0 points  What month? 0 points  What time? 0 points  Count back  from 20 0 points  Months in reverse 2 points  Repeat phrase 0 points  Total Score 2    Immunization History  Administered Date(s) Administered  . Influenza Split 04/21/2012  . Influenza,inj,Quad PF,6+ Mos 05/19/2013, 05/16/2015, 01/26/2016  . Influenza-Unspecified 02/24/2018  . Tdap 11/05/2012    Qualifies for Shingles Vaccine? yes  Screening Tests Health Maintenance  Topic Date Due  . PAP SMEAR-Modifier  02/10/1980  . INFLUENZA VACCINE  11/22/2018  . MAMMOGRAM  12/20/2019  . TETANUS/TDAP  11/06/2022  .  COLONOSCOPY  05/22/2025  . Hepatitis C Screening  Completed  . HIV Screening  Completed    Cancer Screenings: Lung: Low Dose CT Chest recommended if Age 83-80 years, 30 pack-year currently smoking OR have quit w/in 15years. Patient does not qualify. Breast:  Up to date on Mammogram? Yes   Up to date of Bone Density/Dexa? Yes Colorectal: up to date  Additional Screenings: : Hepatitis C Screening: ordered      Plan:   Hep C screening ordered. Wants to find exercises that can be done in the house.  I have personally reviewed and noted the following in the patient's chart:   . Medical and social history . Use of alcohol, tobacco or illicit drugs  . Current medications and supplements . Functional ability and status . Nutritional status . Physical activity . Advanced directives . List of other physicians . Hospitalizations, surgeries, and ER visits in previous 12 months . Vitals . Screenings to include cognitive, depression, and falls . Referrals and appointments  In addition, I have reviewed and discussed with patient certain preventive protocols, quality metrics, and best practice recommendations. A written personalized care plan for preventive services as well as general preventive health recommendations were provided to patient.     Kellie Simmering, LPN  [id number]

## 2018-10-09 NOTE — Progress Notes (Signed)
Subjective:     Patient ID: Ashley Lindsey , female    DOB: 1961/03/21 , 58 y.o.   MRN: [id number]   Chief Complaint  Patient presents with  . Anemia    HPI  She is taking an over the counter iron supplement.  She describes having intermittent dyspnea.  She is exercising more with walking in the driveway and a stationary cycle.     She has not had red meat in the last 3 weeks.    Anemia Presents for follow-up visit. There has been no abdominal pain, fever or palpitations. Signs of blood loss that are not present include hematemesis. There are no compliance problems.      Past Medical History:  Diagnosis Date  . Abdominal cramps   . Arthritis   . Cancer [facility])    s/p radiation- last dose 6/12//has been off Tamoxifen 8/12  . Chronic headaches   . Costochondritis   . Fatigue   . Fibromyalgia 03/06/2013  . Lymphedema    RIGHT ARM-////  STATES USE LEFT ARM FOR BP'S  . Migraines   . Muscle spasms of head and/or neck    following to the breast  . Passed out    4th of july  . Personal history of radiation therapy 2010   lt breast   . S/P radiation therapy 08/19/07 - 10/10/07   Left Breast/5040 cGy/28 fractions with Boost for a toatl dose of 6300 dGy  . S/P radiation therapy 08/14/10 -10/03/10   right breast  . Sleep apnea    STOP BANG SCORE 5     Family History  Problem Relation Age of Onset  . Breast cancer Mother 40  . Heart disease Father   . Prostate cancer Maternal Uncle 78  . Cancer Maternal Aunt 63       d. from "female cancer" possibly cervical  . Breast cancer Maternal Aunt 60  . Prostate cancer Paternal Uncle   . Prostate cancer Maternal Grandfather 53  . Prostate cancer Maternal Uncle 80  . Prostate cancer Paternal Uncle   . Breast cancer Cousin 58       mat 1st cousin     Current Outpatient Medications:  .  acetaminophen (TYLENOL) 650 MG CR tablet, Take 650 mg by mouth every 8 (eight) hours as needed for pain., Disp: , Rfl:  .  albuterol (PROAIR HFA)  108 (90 Base) MCG/ACT inhaler, Inhale 1-2 puffs into the lungs every 6 (six) hours as needed for wheezing or shortness of breath., Disp: 1 Inhaler, Rfl: 1 .  Bisacodyl (DULCOLAX PO), Take 1 capsule by mouth daily as needed (constipation). , Disp: , Rfl:  .  cholecalciferol 5000 units TABS, Take 1 tablet (5,000 Units total) by mouth 2 (two) times daily. (Patient taking differently: Take 5,000 Units by mouth daily. ), Disp: , Rfl:  .  cyclobenzaprine (FLEXERIL) 10 MG tablet, Take 1 tablet (10 mg total) by mouth 3 (three) times daily as needed for muscle spasms., Disp: 30 tablet, Rfl: 1 .  diphenhydrAMINE (BENADRYL) 25 mg capsule, Take 25 mg by mouth 2 (two) times daily., Disp: , Rfl:  .  fish oil-omega-3 fatty acids 1000 MG capsule, Take 1 g by mouth daily. , Disp: , Rfl:  .  Flaxseed, Linseed, (FLAXSEED OIL MAX STR PO), Take 1 capsule by mouth daily. , Disp: , Rfl:  .  guaiFENesin-dextromethorphan (ROBITUSSIN DM) 100-10 MG/5ML syrup, Take 5 mLs by mouth 3 (three) times daily as needed for cough. (Patient not  taking: Reported on 08/05/2018), Disp: 118 mL, Rfl: 0 .  hydrochlorothiazide (HYDRODIURIL) 12.5 MG tablet, TAKE 1 TABLET BY MOUTH EVERY DAY, Disp: 90 tablet, Rfl: 0 .  Iron-FA-B Cmp-C-Biot-Probiotic (FUSION PLUS) CAPS, Take 1 capsule by mouth daily., Disp: 30 capsule, Rfl: 3 .  Melatonin 5 MG TABS, Take 1 tablet by mouth., Disp: , Rfl:  .  meloxicam (MOBIC) 7.5 MG tablet, TAKE 1 TABLET BY MOUTH EVERY DAY, Disp: 30 tablet, Rfl: 3 .  Multiple Vitamins-Minerals (MULTIVITAMIN & MINERAL PO), Take 1 tablet by mouth daily., Disp: , Rfl:  .  omeprazole (PRILOSEC) 20 MG capsule, Take 20 mg by mouth daily., Disp: , Rfl:  .  propranolol (INDERAL) 20 MG tablet, Take 1 tablet (20 mg total) by mouth daily., Disp: 90 tablet, Rfl: 1 .  senna (SENOKOT) 8.6 MG tablet, Take 1 tablet by mouth daily as needed for constipation. , Disp: , Rfl:  .  SYMBICORT 80-4.5 MCG/ACT inhaler, INHALE 2 PUFF BY INHALATION ROUTE EVERY  DAY IN THE MORNING AND EVENING, Disp: 30.6 Inhaler, Rfl: 1 .  triamcinolone cream (KENALOG) 0.1 %, Apply 1 application topically 2 (two) times daily. Apply for up to one week. (Patient taking differently: Apply 1 application topically 2 (two) times daily as needed (rash). ), Disp: 30 g, Rfl: 0 .  UNABLE TO FIND, Rx: L8000-Post Surgical Bra (Quantity: 6) O2423- Silicone Breast Prosthesis (Quantity: 2) Dx: 174.9; Bilateral partial mastectomy Replacement due to hot flashes since previous items were too heavy and hot, Disp: 1 each, Rfl: 0 .  venlafaxine XR (EFFEXOR-XR) 150 MG 24 hr capsule, Take 1 capsule (150 mg total) by mouth See admin instructions. Nightly with 75 mg capsule, total of 225 mg nightly, Disp: 90 capsule, Rfl: 3 .  venlafaxine XR (EFFEXOR-XR) 75 MG 24 hr capsule, TAKE 1 CAPSULE DAILY ALONG WITH 150MG  CAP TO MAKE IT 225MG  TOTAL, Disp: 90 capsule, Rfl: 0 .  vitamin E 200 UNIT capsule, Take 200 Units by mouth daily., Disp: , Rfl:    Allergies  Allergen Reactions  . Aspirin     unknown  . Penicillins Hives and Swelling    Has patient had a PCN reaction causing immediate rash, facial/tongue/throat swelling, SOB or lightheadedness with hypotension: Yes Has patient had a PCN reaction causing severe rash involving mucus membranes or skin necrosis: Yes Has patient had a PCN reaction that required hospitalization Yes Has patient had a PCN reaction occurring within the last 10 years: No If all of the above answers are "NO", then may proceed with Cephalosporin use.      Review of Systems  Constitutional: Negative for fatigue and fever.  Eyes: Negative for photophobia.  Cardiovascular: Negative.  Negative for chest pain, palpitations and leg swelling.  Gastrointestinal: Negative for abdominal pain and hematemesis.  Endocrine: Negative for polydipsia, polyphagia and polyuria.  Neurological: Negative for dizziness and headaches.     Today's Vitals   10/09/18 1141  BP: 120/78  Pulse: 88   Temp: 98 F (36.7 C)  TempSrc: Oral  Weight: 289 lb (131.1 kg)  Height: 5' 1.2" (1.554 m)  PainSc: 6    Body mass index is 54.25 kg/m.   Objective:  Physical Exam Constitutional:      Appearance: Normal appearance.  Cardiovascular:     Rate and Rhythm: Normal rate and regular rhythm.     Pulses: Normal pulses.     Heart sounds: Normal heart sounds. No murmur.  Pulmonary:     Effort: Pulmonary effort is  normal.     Breath sounds: Normal breath sounds.  Musculoskeletal: Normal range of motion.  Skin:    General: Skin is warm and dry.     Capillary Refill: Capillary refill takes less than 2 seconds.  Neurological:     General: No focal deficit present.     Mental Status: She is alert and oriented to person, place, and time.  Psychiatric:        Mood and Affect: Mood normal.        Behavior: Behavior normal.        Thought Content: Thought content normal.        Judgment: Judgment normal.         Assessment And Plan:     1. Anemia, unspecified type  She is currently taking an over the counter iron supplement due to the cost of fusion plus - CBC - Iron, TIBC and Ferritin Panel  2. Encounter for hepatitis C screening test for low risk patient  Will check for Hepatitis C screening due to being born between the years 50-1965 - Hepatitis C antibody  3. Body mass index (BMI) of 50-59.9 in adult [facility])  Chronic  Discussed healthy diet and regular exercise options   Encouraged to exercise at least 150 minutes per week with 2 days of strength training as tolerated  She has lost approximately 3 lbs since February  Wt Readings from Last 3 Encounters:  10/09/18 289 lb (131.1 kg)  10/09/18 289 lb 6.4 oz (131.3 kg)  06/06/18 292 lb 6.4 oz (132.6 kg)    [name], FNP    THE PATIENT IS ENCOURAGED TO PRACTICE SOCIAL DISTANCING DUE TO THE COVID-19 PANDEMIC.

## 2018-10-09 NOTE — Patient Instructions (Addendum)
Ashley Lindsey , Thank you for taking time to come for your Medicare Wellness Visit. I appreciate your ongoing commitment to your health goals. Please review the following plan we discussed and let me know if I can assist you in the future.   Screening recommendations/referrals: Colonoscopy: 04/2015 Mammogram: 11/2017 Bone Density: 02/2011 Recommended yearly ophthalmology/optometry visit for glaucoma screening and checkup Recommended yearly dental visit for hygiene and checkup  Vaccinations: Influenza vaccine: 02/2018 Pneumococcal vaccine:n/a Tdap vaccine: 10/2012 Shingles vaccine: discussed    Advanced directives: Advance directive discussed with you today. Even though you declined this today please call our office should you change your mind and we can give you the proper paperwork for you to fill out.   Conditions/risks identified: obesity  Next appointment: 01/15/2019 at 10:30  Preventive Care 40-64 Years, Female Preventive care refers to lifestyle choices and visits with your health care provider that can promote health and wellness. What does preventive care include?  A yearly physical exam. This is also called an annual well check.  Dental exams once or twice a year.  Routine eye exams. Ask your health care provider how often you should have your eyes checked.  Personal lifestyle choices, including:  Daily care of your teeth and gums.  Regular physical activity.  Eating a healthy diet.  Avoiding tobacco and drug use.  Limiting alcohol use.  Practicing safe sex.  Taking low-dose aspirin daily starting at age 21.  Taking vitamin and mineral supplements as recommended by your health care provider. What happens during an annual well check? The services and screenings done by your health care provider during your annual well check will depend on your age, overall health, lifestyle risk factors, and family history of disease. Counseling  Your health care provider may  ask you questions about your:  Alcohol use.  Tobacco use.  Drug use.  Emotional well-being.  Home and relationship well-being.  Sexual activity.  Eating habits.  Work and work Statistician.  Method of birth control.  Menstrual cycle.  Pregnancy history. Screening  You may have the following tests or measurements:  Height, weight, and BMI.  Blood pressure.  Lipid and cholesterol levels. These may be checked every 5 years, or more frequently if you are over 24 years old.  Skin check.  Lung cancer screening. You may have this screening every year starting at age 65 if you have a 30-pack-year history of smoking and currently smoke or have quit within the past 15 years.  Fecal occult blood test (FOBT) of the stool. You may have this test every year starting at age 26.  Flexible sigmoidoscopy or colonoscopy. You may have a sigmoidoscopy every 5 years or a colonoscopy every 10 years starting at age 57.  Hepatitis C blood test.  Hepatitis B blood test.  Sexually transmitted disease (STD) testing.  Diabetes screening. This is done by checking your blood sugar (glucose) after you have not eaten for a while (fasting). You may have this done every 1-3 years.  Mammogram. This may be done every 1-2 years. Talk to your health care provider about when you should start having regular mammograms. This may depend on whether you have a family history of breast cancer.  BRCA-related cancer screening. This may be done if you have a family history of breast, ovarian, tubal, or peritoneal cancers.  Pelvic exam and Pap test. This may be done every 3 years starting at age 23. Starting at age 51, this may be done every 5 years if  you have a Pap test in combination with an HPV test.  Bone density scan. This is done to screen for osteoporosis. You may have this scan if you are at high risk for osteoporosis. Discuss your test results, treatment options, and if necessary, the need for more  tests with your health care provider. Vaccines  Your health care provider may recommend certain vaccines, such as:  Influenza vaccine. This is recommended every year.  Tetanus, diphtheria, and acellular pertussis (Tdap, Td) vaccine. You may need a Td booster every 10 years.  Zoster vaccine. You may need this after age 49.  Pneumococcal 13-valent conjugate (PCV13) vaccine. You may need this if you have certain conditions and were not previously vaccinated.  Pneumococcal polysaccharide (PPSV23) vaccine. You may need one or two doses if you smoke cigarettes or if you have certain conditions. Talk to your health care provider about which screenings and vaccines you need and how often you need them. This information is not intended to replace advice given to you by your health care provider. Make sure you discuss any questions you have with your health care provider. Document Released: 05/06/2015 Document Revised: 12/28/2015 Document Reviewed: 02/08/2015 Elsevier Interactive Patient Education  2017 [place] Prevention in the Home Falls can cause injuries. They can happen to people of all ages. There are many things you can do to make your home safe and to help prevent falls. What can I do on the outside of my home?  Regularly fix the edges of walkways and driveways and fix any cracks.  Remove anything that might make you trip as you walk through a door, such as a raised step or threshold.  Trim any bushes or trees on the path to your home.  Use bright outdoor lighting.  Clear any walking paths of anything that might make someone trip, such as rocks or tools.  Regularly check to see if handrails are loose or broken. Make sure that both sides of any steps have handrails.  Any raised decks and porches should have guardrails on the edges.  Have any leaves, snow, or ice cleared regularly.  Use sand or salt on walking paths during winter.  Clean up any spills in your  garage right away. This includes oil or grease spills. What can I do in the bathroom?  Use night lights.  Install grab bars by the toilet and in the tub and shower. Do not use towel bars as grab bars.  Use non-skid mats or decals in the tub or shower.  If you need to sit down in the shower, use a plastic, non-slip stool.  Keep the floor dry. Clean up any water that spills on the floor as soon as it happens.  Remove soap buildup in the tub or shower regularly.  Attach bath mats securely with double-sided non-slip rug tape.  Do not have throw rugs and other things on the floor that can make you trip. What can I do in the bedroom?  Use night lights.  Make sure that you have a light by your bed that is easy to reach.  Do not use any sheets or blankets that are too big for your bed. They should not hang down onto the floor.  Have a firm chair that has side arms. You can use this for support while you get dressed.  Do not have throw rugs and other things on the floor that can make you trip. What can I do in the  kitchen?  Clean up any spills right away.  Avoid walking on wet floors.  Keep items that you use a lot in easy-to-reach places.  If you need to reach something above you, use a strong step stool that has a grab bar.  Keep electrical cords out of the way.  Do not use floor polish or wax that makes floors slippery. If you must use wax, use non-skid floor wax.  Do not have throw rugs and other things on the floor that can make you trip. What can I do with my stairs?  Do not leave any items on the stairs.  Make sure that there are handrails on both sides of the stairs and use them. Fix handrails that are broken or loose. Make sure that handrails are as long as the stairways.  Check any carpeting to make sure that it is firmly attached to the stairs. Fix any carpet that is loose or worn.  Avoid having throw rugs at the top or bottom of the stairs. If you do have throw  rugs, attach them to the floor with carpet tape.  Make sure that you have a light switch at the top of the stairs and the bottom of the stairs. If you do not have them, ask someone to add them for you. What else can I do to help prevent falls?  Wear shoes that:  Do not have high heels.  Have rubber bottoms.  Are comfortable and fit you well.  Are closed at the toe. Do not wear sandals.  If you use a stepladder:  Make sure that it is fully opened. Do not climb a closed stepladder.  Make sure that both sides of the stepladder are locked into place.  Ask someone to hold it for you, if possible.  Clearly mark and make sure that you can see:  Any grab bars or handrails.  First and last steps.  Where the edge of each step is.  Use tools that help you move around (mobility aids) if they are needed. These include:  Canes.  Walkers.  Scooters.  Crutches.  Turn on the lights when you go into a dark area. Replace any light bulbs as soon as they burn out.  Set up your furniture so you have a clear path. Avoid moving your furniture around.  If any of your floors are uneven, fix them.  If there are any pets around you, be aware of where they are.  Review your medicines with your doctor. Some medicines can make you feel dizzy. This can increase your chance of falling. Ask your doctor what other things that you can do to help prevent falls. This information is not intended to replace advice given to you by your health care provider. Make sure you discuss any questions you have with your health care provider. Document Released: 02/03/2009 Document Revised: 09/15/2015 Document Reviewed: 05/14/2014 Elsevier Interactive Patient Education  2017 [employer].

## 2018-10-10 LAB — CBC
Hematocrit: 37.7 % (ref 34.0–46.6)
Hemoglobin: 11.5 g/dL (ref 11.1–15.9)
MCH: 24.1 pg — ABNORMAL LOW (ref 26.6–33.0)
MCHC: 30.5 g/dL — ABNORMAL LOW (ref 31.5–35.7)
MCV: 79 fL (ref 79–97)
Platelets: 367 10*3/uL (ref 150–450)
RBC: 4.78 x10E6/uL (ref 3.77–5.28)
RDW: 15.5 % — ABNORMAL HIGH (ref 11.7–15.4)
WBC: 8.7 10*3/uL (ref 3.4–10.8)

## 2018-10-10 LAB — IRON,TIBC AND FERRITIN PANEL
Ferritin: 47 ng/mL (ref 15–150)
Iron Saturation: 44 % (ref 15–55)
Iron: 159 ug/dL (ref 27–159)
Total Iron Binding Capacity: 365 ug/dL (ref 250–450)
UIBC: 206 ug/dL (ref 131–425)

## 2018-10-10 LAB — HEPATITIS C ANTIBODY: Hep C Virus Ab: 0.1 s/co ratio (ref 0.0–0.9)

## 2018-10-15 ENCOUNTER — Other Ambulatory Visit: Payer: Self-pay

## 2018-10-15 DIAGNOSIS — M797 Fibromyalgia: Secondary | ICD-10-CM

## 2018-10-15 MED ORDER — MELOXICAM 7.5 MG PO TABS: 7.5000 mg | ORAL_TABLET | Freq: Every day | ORAL | 1 refills | Status: DC

## 2018-11-10 ENCOUNTER — Other Ambulatory Visit: Payer: Self-pay | Admitting: Nurse Practitioner

## 2018-11-10 DIAGNOSIS — M797 Fibromyalgia: Secondary | ICD-10-CM

## 2018-11-10 NOTE — Telephone Encounter (Signed)
Cyclobenzaprine refill 

## 2018-11-11 ENCOUNTER — Telehealth: Payer: Self-pay

## 2018-11-11 ENCOUNTER — Encounter (HOSPITAL_COMMUNITY): Payer: Self-pay | Admitting: Emergency Medicine

## 2018-11-11 ENCOUNTER — Ambulatory Visit (HOSPITAL_COMMUNITY)
Admission: EM | Admit: 2018-11-11 | Discharge: 2018-11-11 | Disposition: A | Discharge status: Home / Self Care | Payer: Medicare Other | Attending: Family Medicine | Admitting: Family Medicine

## 2018-11-11 ENCOUNTER — Other Ambulatory Visit: Payer: Self-pay | Admitting: Medical

## 2018-11-11 ENCOUNTER — Other Ambulatory Visit: Payer: Self-pay | Admitting: Nurse Practitioner

## 2018-11-11 ENCOUNTER — Other Ambulatory Visit: Payer: Self-pay

## 2018-11-11 ENCOUNTER — Other Ambulatory Visit: Payer: Self-pay | Admitting: Hematology and Oncology

## 2018-11-11 DIAGNOSIS — R519 Headache, unspecified: Secondary | ICD-10-CM

## 2018-11-11 DIAGNOSIS — G43709 Chronic migraine without aura, not intractable, without status migrainosus: Secondary | ICD-10-CM | POA: Diagnosis not present

## 2018-11-11 DIAGNOSIS — I10 Essential (primary) hypertension: Secondary | ICD-10-CM

## 2018-11-11 DIAGNOSIS — F32A Depression, unspecified: Secondary | ICD-10-CM

## 2018-11-11 DIAGNOSIS — F329 Major depressive disorder, single episode, unspecified: Secondary | ICD-10-CM

## 2018-11-11 DIAGNOSIS — G8929 Other chronic pain: Secondary | ICD-10-CM

## 2018-11-11 MED ORDER — PROPRANOLOL HCL 20 MG PO TABS: 20.0000 mg | ORAL_TABLET | Freq: Two times a day (BID) | ORAL | 0 refills | Status: DC

## 2018-11-11 MED ORDER — PROMETHAZINE HCL 25 MG PO TABS: 25.0000 mg | ORAL_TABLET | Freq: Four times a day (QID) | ORAL | 0 refills | Status: DC | PRN

## 2018-11-11 NOTE — ED Provider Notes (Signed)
[facility]    CSN: [id number] Arrival date & time: 11/11/18  1919      History   Chief Complaint Chief Complaint  Patient presents with  . Hypertension    HPI Ashley Lindsey is a 58 y.o. female.   HPI  Patient has known hypertension.  For this she takes Microzide 12.5 mg a day and propanolol 20 mg a day.  She is on a low-dose of propranolol.  Usually her blood pressure is well controlled.  She states that she is good about not eating salt.  No heart disease.  No chest pain or pressure.  She states for the last couple of days she has had a headache.  She has known migraines.  With her headache she has had nausea.  Her headache is gone but the nausea persisted.  She took a Zofran last night but it gave her terrible nightmares.  Today she has persistent nausea.  She has been taking her blood pressure several times a day.  It has been anywhere from 140/90 to 170/100.  No visual symptoms.  No neuro symptoms. She called her primary care doctor who recommended she come in for evaluation. She has not a change in her medicines.  Not any increase in her salt. She denies stress but she does live at home with a 76 year old mother and 30 year old uncle.  She is disabled.  Past Medical History:  Diagnosis Date  . Abdominal cramps   . Arthritis   . Cancer [facility])    s/p radiation- last dose 6/12//has been off Tamoxifen 8/12  . Chronic headaches   . Costochondritis   . Fatigue   . Fibromyalgia 03/06/2013  . Lymphedema    RIGHT ARM-////  STATES USE LEFT ARM FOR BP'S  . Migraines   . Muscle spasms of head and/or neck    following to the breast  . Passed out    4th of july  . Personal history of radiation therapy 2010   lt breast   . S/P radiation therapy 08/19/07 - 10/10/07   Left Breast/5040 cGy/28 fractions with Boost for a toatl dose of 6300 dGy  . S/P radiation therapy 08/14/10 -10/03/10   right breast  . Sleep apnea    STOP BANG SCORE 5    Patient Active Problem List   Diagnosis Date Noted  . Class 3 severe obesity due to excess calories without serious comorbidity with body mass index (BMI) of 40.0 to 44.9 in adult ([place]) 08/05/2018  . Depression 08/05/2018  . Chronic nonintractable headache 08/05/2018  . Elevated blood-pressure reading without diagnosis of hypertension 06/06/2018  . Chronic pain of right ankle 04/24/2018  . Breast cancer of lower-outer quadrant of right female breast ([place]) 12/02/2014  . Bronchospasm 09/21/2014  . Dyspnea 09/21/2014  . Chronic pain of right knee 09/21/2014  . Fibromyalgia 03/06/2013  . Dense breasts 03/06/2013  . Mastalgia 03/06/2013  . Back pain, lumbosacral 03/06/2013  . Lymphedema of arm 03/06/2013  . Atypical chest pain 01/06/2013  . Breast cancer of upper-inner quadrant of left female breast ([place]) 09/08/2012  . Family history of malignant neoplasm of breast 09/08/2012  . S/P radiation therapy   . History of breast cancer in female 08/13/2011    Past Surgical History:  Procedure Laterality Date  . ABDOMINAL HYSTERECTOMY     with left salpingooophorectomy  . BREAST LUMPECTOMY  2009/2012   LEFT/RIGHT with lymph node dissection  . KNEE ARTHROSCOPY     right  . OOPHORECTOMY  2013    OB History   No obstetric history on file.      Home Medications    Prior to Admission medications   Medication Sig Start Date End Date Taking? Authorizing Provider  albuterol (PROAIR HFA) 108 (90 Base) MCG/ACT inhaler Inhale 1-2 puffs into the lungs every 6 (six) hours as needed for wheezing or shortness of breath. 07/17/18  Yes Tanner, Lyndon Code., PA-C  Melatonin 5 MG TABS Take 1 tablet by mouth.   Yes [provider]  meloxicam (MOBIC) 7.5 MG tablet Take 1 tablet (7.5 mg total) by mouth daily. 10/15/18  Yes [name], FNP  Multiple Vitamins-Minerals (MULTIVITAMIN & MINERAL PO) Take 1 tablet by mouth daily.   Yes [provider]  omeprazole (PRILOSEC) 20 MG capsule Take 20 mg by mouth daily.   Yes  [provider]  venlafaxine XR (EFFEXOR-XR) 150 MG 24 hr capsule Take 1 capsule (150 mg total) by mouth See admin instructions. Nightly with 75 mg capsule, total of 225 mg nightly 05/28/18  Yes Nicholas Lose, MD  venlafaxine XR (EFFEXOR-XR) 75 MG 24 hr capsule TAKE 1 CAPSULE DAILY ALONG WITH 150MG  CAP TO MAKE IT 225MG  TOTAL 10/06/18  Yes Nicholas Lose, MD  acetaminophen (TYLENOL) 650 MG CR tablet Take 650 mg by mouth every 8 (eight) hours as needed for pain.    [provider]  Bisacodyl (DULCOLAX PO) Take 1 capsule by mouth daily as needed (constipation).     [provider]  cholecalciferol 5000 units TABS Take 1 tablet (5,000 Units total) by mouth 2 (two) times daily. Patient taking differently: Take 5,000 Units by mouth daily.  07/21/15   Nicholas Lose, MD  diphenhydrAMINE (BENADRYL) 25 mg capsule Take 25 mg by mouth 2 (two) times daily.    [provider]  fish oil-omega-3 fatty acids 1000 MG capsule Take 1 g by mouth daily.     [provider]  Flaxseed, Linseed, (FLAXSEED OIL MAX STR PO) Take 1 capsule by mouth daily.     [provider]  hydrochlorothiazide (HYDRODIURIL) 12.5 MG tablet TAKE 1 TABLET BY MOUTH EVERY DAY 09/02/18   [name], FNP  Iron-FA-B Cmp-C-Biot-Probiotic (FUSION PLUS) CAPS Take 1 capsule by mouth daily. 06/23/18   [name], FNP  promethazine (PHENERGAN) 25 MG tablet Take 1 tablet (25 mg total) by mouth every 6 (six) hours as needed for nausea or vomiting. 11/11/18   Raylene Everts, MD  propranolol (INDERAL) 20 MG tablet Take 1 tablet (20 mg total) by mouth 2 (two) times daily. 11/11/18   Raylene Everts, MD  senna (SENOKOT) 8.6 MG tablet Take 1 tablet by mouth daily as needed for constipation.     [provider]  SYMBICORT 80-4.5 MCG/ACT inhaler INHALE 2 PUFF BY INHALATION ROUTE EVERY DAY IN THE MORNING AND EVENING 06/20/18   [name], FNP  triamcinolone cream (KENALOG) 0.1 % Apply 1 application  topically 2 (two) times daily. Apply for up to one week. Patient taking differently: Apply 1 application topically 2 (two) times daily as needed (rash).  12/29/16   Vanessa Kick, MD  UNABLE TO FIND Rx: L8000-Post Surgical Bra (Quantity: 6) A4166- Silicone Breast Prosthesis (Quantity: 2) Dx: 174.9; Bilateral partial mastectomy Replacement due to hot flashes since previous items were too heavy and hot 01/20/13   Fanny Skates, MD  vitamin E 200 UNIT capsule Take 200 Units by mouth daily.    [provider]    Family History Family History  Problem Relation Age of Onset  . Breast cancer Mother 4  . Heart disease Father   . Prostate cancer Maternal Uncle 78  . Cancer Maternal Aunt 63       d. from "female cancer" possibly cervical  . Breast cancer Maternal Aunt 60  . Prostate cancer Paternal Uncle   . Prostate cancer Maternal Grandfather 52  . Prostate cancer Maternal Uncle 80  . Prostate cancer Paternal Uncle   . Breast cancer Cousin 23       mat 1st cousin    Social History Social History   Tobacco Use  . Smoking status: Former Smoker    Packs/day: 0.25    Years: 1.00    Pack years: 0.25    Types: Cigarettes  . Smokeless tobacco: Never Used  Substance Use Topics  . Alcohol use: Not Currently  . Drug use: No     Allergies   Aspirin and Penicillins   Review of Systems Review of Systems  Constitutional: Negative for chills and fever.  HENT: Negative for ear pain and sore throat.   Eyes: Negative for pain and visual disturbance.  Respiratory: Negative for cough and shortness of breath.   Cardiovascular: Negative for chest pain and palpitations.  Gastrointestinal: Positive for nausea. Negative for abdominal pain and vomiting.  Genitourinary: Negative for dysuria and hematuria.  Musculoskeletal: Negative for arthralgias and back pain.  Skin: Negative for color change and rash.  Neurological: Positive for headaches. Negative for seizures and syncope.  All  other systems reviewed and are negative.    Physical Exam Triage Vital Signs ED Triage Vitals  Enc Vitals Group     BP 11/11/18 1938 (!) 146/95     Pulse Rate 11/11/18 1938 (!) 108     Resp 11/11/18 1938 (!) 22     Temp 11/11/18 1938 98.4 F (36.9 C)     Temp Source 11/11/18 1938 Oral     SpO2 11/11/18 1938 100 %     Weight --      Height --      Head Circumference --      Peak Flow --      Pain Score 11/11/18 1932 0     Pain Loc --      Pain Edu? --      Excl. in [place]? --    No data found.  Updated Vital Signs BP (!) 146/95 (BP Location: Left Arm) Comment (BP Location): large cuff  Pulse (!) 108   Temp 98.4 F (36.9 C) (Oral)   Resp (!) 22   SpO2 100%      Physical Exam Constitutional:      General: She is not in acute distress.    Appearance: She is well-developed. She is obese.  HENT:     Head: Normocephalic and atraumatic.     Nose: Nose normal.     Mouth/Throat:     Mouth: Mucous membranes are moist.     Pharynx: No posterior oropharyngeal erythema.  Eyes:     Conjunctiva/sclera: Conjunctivae normal.     Pupils: Pupils are equal, round, and reactive to light.  Neck:     Musculoskeletal: Normal range of motion.  Cardiovascular:     Rate and Rhythm: Tachycardia present.     Heart sounds: Normal heart sounds.  Pulmonary:     Effort: Pulmonary effort is normal. No respiratory distress.     Breath sounds: Normal breath sounds.  Abdominal:     General: There is no distension.  Palpations: Abdomen is soft.  Musculoskeletal: Normal range of motion.     Right lower leg: No edema.     Left lower leg: No edema.  Skin:    General: Skin is warm and dry.  Neurological:     General: No focal deficit present.     Mental Status: She is alert.     Motor: No weakness.     Coordination: Coordination normal.     Gait: Gait normal.     Deep Tendon Reflexes: Reflexes normal.  Psychiatric:        Behavior: Behavior normal.      UC Treatments / Results  Labs  (all labs ordered are listed, but only abnormal results are displayed) Labs Reviewed - No data to display  EKG   Radiology No results found.  Procedures Procedures (including critical care time)  Medications Ordered in UC Medications - No data to display  Initial Impression / Assessment and Plan / UC Course  I have reviewed the triage vital signs and the nursing notes.  Pertinent labs & imaging results that were available during my care of the patient were reviewed by me and considered in my medical decision making (see chart for details).     Explained with this elevated blood pressure and tachycardia she likely could benefit from additional beta-blocker.  She she is willing to do this.  I am increasing the propranolol to twice a day although she may need increased dosage.  She should continue her Microzide.  Continue monitoring the blood pressure once or twice a day.  Call her PCP towards the end of the week to report what her blood pressure is doing.  I gave her a few phenergan to take for nausea when she has her migraines. Final Clinical Impressions(s) / UC Diagnoses   Final diagnoses:  Essential hypertension  Chronic migraine without aura without status migrainosus, not intractable     Discharge Instructions     Increase propranolol to 2 pills a day Continue to limit salt in your diet Take promethazine as needed for nausea Get plenty of rest Continue to monitor your blood pressure Call your primary care physician if it continues elevated   ED Prescriptions    Medication Sig Dispense Auth. Provider   propranolol (INDERAL) 20 MG tablet Take 1 tablet (20 mg total) by mouth 2 (two) times daily. 180 tablet Raylene Everts, MD   promethazine (PHENERGAN) 25 MG tablet Take 1 tablet (25 mg total) by mouth every 6 (six) hours as needed for nausea or vomiting. 30 tablet Raylene Everts, MD     Controlled Substance Prescriptions [place] Controlled Substance Registry  consulted? Not Applicable   Raylene Everts, MD 11/11/18 2012

## 2018-11-11 NOTE — Telephone Encounter (Signed)
Patient called stating she has been having frequent headaches and her blood pressure has been running high 170/101. Pt advised to go to urgent care. YRL,RMA

## 2018-11-11 NOTE — Discharge Instructions (Signed)
Increase propranolol to 2 pills a day Continue to limit salt in your diet Take promethazine as needed for nausea Get plenty of rest Continue to monitor your blood pressure Call your primary care physician if it continues elevated

## 2018-11-11 NOTE — ED Triage Notes (Addendum)
Patient had a headache since Friday, intermittent.  Patient has been monitoring blood pressure.   bp earlier, per patient was 179/102, 147/97 , 170/107 Patient also has nausea.

## 2018-11-12 NOTE — Telephone Encounter (Signed)
Cyclobenzaprine refill 

## 2018-11-19 NOTE — Telephone Encounter (Signed)
Advise refill.  Thanks.

## 2018-11-26 ENCOUNTER — Other Ambulatory Visit: Payer: Self-pay | Admitting: Nurse Practitioner

## 2018-11-27 NOTE — Telephone Encounter (Signed)
Cyclobenzaprine refill 

## 2018-12-03 ENCOUNTER — Other Ambulatory Visit: Payer: Self-pay | Admitting: Nurse Practitioner

## 2018-12-04 NOTE — Telephone Encounter (Signed)
Promethazine refill 

## 2018-12-18 ENCOUNTER — Other Ambulatory Visit: Payer: Self-pay | Admitting: Nurse Practitioner

## 2018-12-23 NOTE — Telephone Encounter (Signed)
Cyclobenzaprine 10mg 

## 2018-12-25 NOTE — Telephone Encounter (Signed)
Noted "Eprescribing error" message for this order.  Provider refusal for this eRx order called to pharmacy voicemail by this nurse.

## 2018-12-30 ENCOUNTER — Telehealth: Payer: Self-pay | Admitting: *Deleted

## 2018-12-30 ENCOUNTER — Other Ambulatory Visit: Payer: Self-pay

## 2018-12-30 ENCOUNTER — Ambulatory Visit (HOSPITAL_COMMUNITY)
Admission: RE | Admit: 2018-12-30 | Discharge: 2018-12-30 | Disposition: A | Discharge status: Home / Self Care | Payer: Medicare Other | Source: Ambulatory Visit | Attending: Medical | Admitting: Medical

## 2018-12-30 ENCOUNTER — Inpatient Hospital Stay: Payer: Medicare Other | Attending: Medical | Admitting: Medical

## 2018-12-30 VITALS — BP 118/76 | HR 91 | Temp 98.7°F | Resp 19 | Ht 61.2 in | Wt 297.1 lb

## 2018-12-30 DIAGNOSIS — Z87891 Personal history of nicotine dependence: Secondary | ICD-10-CM | POA: Diagnosis not present

## 2018-12-30 DIAGNOSIS — G4730 Sleep apnea, unspecified: Secondary | ICD-10-CM | POA: Insufficient documentation

## 2018-12-30 DIAGNOSIS — R2231 Localized swelling, mass and lump, right upper limb: Secondary | ICD-10-CM | POA: Insufficient documentation

## 2018-12-30 DIAGNOSIS — I890 Lymphedema, not elsewhere classified: Secondary | ICD-10-CM | POA: Diagnosis not present

## 2018-12-30 DIAGNOSIS — C50212 Malignant neoplasm of upper-inner quadrant of left female breast: Secondary | ICD-10-CM | POA: Diagnosis not present

## 2018-12-30 DIAGNOSIS — G8929 Other chronic pain: Secondary | ICD-10-CM | POA: Diagnosis not present

## 2018-12-30 DIAGNOSIS — M129 Arthropathy, unspecified: Secondary | ICD-10-CM | POA: Diagnosis not present

## 2018-12-30 DIAGNOSIS — Z170 Estrogen receptor positive status [ER+]: Secondary | ICD-10-CM | POA: Diagnosis not present

## 2018-12-30 DIAGNOSIS — M797 Fibromyalgia: Secondary | ICD-10-CM | POA: Diagnosis not present

## 2018-12-30 DIAGNOSIS — Z923 Personal history of irradiation: Secondary | ICD-10-CM | POA: Insufficient documentation

## 2018-12-30 DIAGNOSIS — R609 Edema, unspecified: Secondary | ICD-10-CM | POA: Insufficient documentation

## 2018-12-30 NOTE — Progress Notes (Signed)
Pt seen by PA Van only, no [facility] RN assessment at this time.  PA aware. 

## 2018-12-30 NOTE — Patient Instructions (Signed)
COVID-19: How to Protect Yourself and Others Know how it spreads  There is currently no vaccine to prevent coronavirus disease 2019 (COVID-19).  The best way to prevent illness is to avoid being exposed to this virus.  The virus is thought to spread mainly from person-to-person. ? Between people who are in close contact with one another (within about 6 feet). ? Through respiratory droplets produced when an infected person coughs, sneezes or talks. ? These droplets can land in the mouths or noses of people who are nearby or possibly be inhaled into the lungs. ? Some recent studies have suggested that COVID-19 may be spread by people who are not showing symptoms. Everyone should Clean your hands often  Wash your hands often with soap and water for at least 20 seconds especially after you have been in a public place, or after blowing your nose, coughing, or sneezing.  If soap and water are not readily available, use a hand sanitizer that contains at least 60% alcohol. Cover all surfaces of your hands and rub them together until they feel dry.  Avoid touching your eyes, nose, and mouth with unwashed hands. Avoid close contact  Stay home if you are sick.  Avoid close contact with people who are sick.  Put distance between yourself and other people. ? Remember that some people without symptoms may be able to spread virus. ? This is especially important for people who are at higher risk of getting very sick.www.cdc.gov/coronavirus/2019-ncov/need-extra-precautions/people-at-higher-risk.html Cover your mouth and nose with a cloth face cover when around others  You could spread COVID-19 to others even if you do not feel sick.  Everyone should wear a cloth face cover when they have to go out in public, for example to the grocery store or to pick up other necessities. ? Cloth face coverings should not be placed on young children under age 2, anyone who has trouble breathing, or is unconscious,  incapacitated or otherwise unable to remove the mask without assistance.  The cloth face cover is meant to protect other people in case you are infected.  Do NOT use a facemask meant for a healthcare worker.  Continue to keep about 6 feet between yourself and others. The cloth face cover is not a substitute for social distancing. Cover coughs and sneezes  If you are in a private setting and do not have on your cloth face covering, remember to always cover your mouth and nose with a tissue when you cough or sneeze or use the inside of your elbow.  Throw used tissues in the trash.  Immediately wash your hands with soap and water for at least 20 seconds. If soap and water are not readily available, clean your hands with a hand sanitizer that contains at least 60% alcohol. Clean and disinfect  Clean AND disinfect frequently touched surfaces daily. This includes tables, doorknobs, light switches, countertops, handles, desks, phones, keyboards, toilets, faucets, and sinks. www.cdc.gov/coronavirus/2019-ncov/prevent-getting-sick/disinfecting-your-home.html  If surfaces are dirty, clean them: Use detergent or soap and water prior to disinfection.  Then, use a household disinfectant. You can see a list of EPA-registered household disinfectants here. cdc.gov/coronavirus 08/26/2018 This information is not intended to replace advice given to you by your health care provider. Make sure you discuss any questions you have with your health care provider. Document Released: 08/05/2018 Document Revised: 09/03/2018 Document Reviewed: 08/05/2018 Elsevier Patient Education  2020 [employer].  

## 2018-12-30 NOTE — Telephone Encounter (Signed)
Received call from pt stating she has been experiencing severe pain in her right arm x4 days.  Pt states she has a history of lymphadema but is unsure if the pain she is experiencing is related to lymphaemea or an underlying cause.  Pt denies redness or recent trama to that extremity.  Pt states she does experience warmth to the extremity.  Pt scheduled to see Sandi Mealy PA in [facility] today at 2pm.

## 2018-12-30 NOTE — Telephone Encounter (Signed)
Received VM from pt, attempt x1 to return call, no answer, LVM.

## 2018-12-30 NOTE — Progress Notes (Signed)
Right upper extremity venous duplex has been completed. Preliminary results can be found in CV Proc through chart review.  Results were given to Sandi Mealy PA.  12/30/18 4:12 PM Carlos Levering RVT

## 2018-12-31 ENCOUNTER — Other Ambulatory Visit: Payer: Self-pay | Admitting: *Deleted

## 2018-12-31 DIAGNOSIS — I890 Lymphedema, not elsewhere classified: Secondary | ICD-10-CM

## 2018-12-31 NOTE — Progress Notes (Signed)
These preliminary result these preliminary results were noted.  Awaiting final report.

## 2018-12-31 NOTE — Progress Notes (Signed)
Received call from pt stating she is still experiencing severe swelling and pain in her right arm.  VAS [place] on 12/31/2018 negative for DVT.  Per Dr. Lindi Adie and Sandi Mealy, PA, pt needs to follow up with surgeon Dr. Dalbert Batman at [facility].  Referral will also be placed for the lymphedema clinic to evaluate pt.  Pt educated to follow up with surgeon and lymphedema clinic, pt verbalized understanding.

## 2018-12-31 NOTE — Progress Notes (Signed)
Symptoms Management Clinic Progress Note   [name] [id number] 18-Mar-1961 58 y.o.  Ashley Lindsey is managed by Dr. Nicholas Lose  Actively treated with chemotherapy/immunotherapy/hormonal therapy: no  Current therapy: none  Next scheduled appointment with provider: no follow up   Assessment: Plan:    Localized swelling of right upper extremity - Plan: VAS [place] UPPER EXTREMITY VENOUS DUPLEX  Malignant neoplasm of upper-inner quadrant of left breast in female, estrogen receptor positive ([place])  Lymphedema of arm   Lymphedema of the right upper extremity: Patient referred for a Doppler ultrasound of her right upper extremity which returned negative for DVT today.  ER positive malignant neoplasm of the left breast: The patient was last seen by Dr. Nicholas Lose on 01/30/2018 but has no follow-up appointment scheduled at this time.  She continues to be followed conservatively with surveillance only.  Please see After Visit Summary for patient specific instructions.  Future Appointments  Date Time Provider [place]  01/15/2019 10:30 AM [name], FNP [facility] None  10/15/2019 10:00 AM [name], FNP [facility] None  10/15/2019 12:00 PM [facility] None    Orders Placed This Encounter  Procedures   VAS [place] UPPER EXTREMITY VENOUS DUPLEX       Subjective:   Patient ID:  Ashley Lindsey is a 58 y.o. (DOB 1961-01-04) female.  Chief Complaint:  Chief Complaint  Patient presents with   Arm Pain    HPI Ashley Lindsey   is a 58 year old female with a history of an ER positive malignant neoplasm of the left breast who is managed by Dr. Nicholas Lose and is followed conservatively with surveillance only.  She presents to the clinic today having last been seen on 01/30/2018.  She reports a 3-day history of increased swelling in her right upper extremity.  She reports having the lymph nodes taken from her axilla bilaterally.  She is wearing a compression  sleeve on her right upper extremity denies any change in activity or trauma.  She has a history of chronic pain secondary to fibromyalgia.  Medications: I have reviewed the patient's current medications.  Allergies:  Allergies  Allergen Reactions   Aspirin     unknown   Penicillins Hives and Swelling    Has patient had a PCN reaction causing immediate rash, facial/tongue/throat swelling, SOB or lightheadedness with hypotension: Yes Has patient had a PCN reaction causing severe rash involving mucus membranes or skin necrosis: Yes Has patient had a PCN reaction that required hospitalization Yes Has patient had a PCN reaction occurring within the last 10 years: No If all of the above answers are "NO", then may proceed with Cephalosporin use.     Past Medical History:  Diagnosis Date   Abdominal cramps    Arthritis    Cancer ([place])    s/p radiation- last dose 6/12//has been off Tamoxifen 8/12   Chronic headaches    Costochondritis    Fatigue    Fibromyalgia 03/06/2013   Lymphedema    RIGHT ARM-////  STATES USE LEFT ARM FOR BP'S   Migraines    Muscle spasms of head and/or neck    following to the breast   Passed out    4th of july   Personal history of radiation therapy 2010   lt breast    S/P radiation therapy 08/19/07 - 10/10/07   Left Breast/5040 cGy/28 fractions with Boost for a toatl dose of 6300 dGy   S/P radiation therapy 08/14/10 -10/03/10  right breast   Sleep apnea    STOP BANG SCORE 5    Past Surgical History:  Procedure Laterality Date   ABDOMINAL HYSTERECTOMY     with left salpingooophorectomy   BREAST LUMPECTOMY  2009/2012   LEFT/RIGHT with lymph node dissection   KNEE ARTHROSCOPY     right   OOPHORECTOMY  2013    Family History  Problem Relation Age of Onset   Breast cancer Mother 66   Heart disease Father    Prostate cancer Maternal Uncle 62   Cancer Maternal Aunt 63       d. from "female cancer" possibly cervical    Breast cancer Maternal Aunt 60   Prostate cancer Paternal Uncle    Prostate cancer Maternal Grandfather 85   Prostate cancer Maternal Uncle 80   Prostate cancer Paternal Uncle    Breast cancer Cousin 36       mat 1st cousin    Social History   Socioeconomic History   Marital status: Divorced    Spouse name: Not on file   Number of children: Not on file   Years of education: Not on file   Highest education level: Not on file  Occupational History   Occupation: disability  Social Designer, fashion/clothing strain: Not on file   Food insecurity    Worry: Never true    Inability: Never true   Transportation needs    Medical: No    Non-medical: No  Tobacco Use   Smoking status: Former Smoker    Packs/day: 0.25    Years: 1.00    Pack years: 0.25    Types: Cigarettes   Smokeless tobacco: Never Used  Substance and Sexual Activity   Alcohol use: Not Currently   Drug use: No   Sexual activity: Not Currently    Birth control/protection: Surgical  Lifestyle   Physical activity    Days per week: 7 days    Minutes per session: 30 min   Stress: Very much  Relationships   Social Herbalist on phone: Not on file    Gets together: Not on file    Attends religious service: Not on file    Active member of club or organization: Not on file    Attends meetings of clubs or organizations: Not on file    Relationship status: Not on file   Intimate partner violence    Fear of current or ex partner: No    Emotionally abused: No    Physically abused: No    Forced sexual activity: No  Other Topics Concern   Not on file  Social History Narrative   Not on file    Past Medical History, Surgical history, Social history, and Family history were reviewed and updated as appropriate.   Please see review of systems for further details on the patient's review from today.   Review of Systems:  Review of Systems  Constitutional: Negative for chills,  diaphoresis and fever.  HENT: Negative for trouble swallowing and voice change.   Respiratory: Negative for cough, chest tightness, shortness of breath and wheezing.   Cardiovascular: Negative for chest pain and palpitations.  Gastrointestinal: Negative for abdominal pain, constipation, diarrhea, nausea and vomiting.  Musculoskeletal: Positive for arthralgias and myalgias. Negative for back pain.       Swelling of the right upper extremity.  Neurological: Negative for dizziness, light-headedness and headaches.    Objective:   Physical Exam:  BP 118/76 (BP Location:  Left Arm, Patient Position: Sitting)    Pulse 91    Temp 98.7 F (37.1 C) (Temporal)    Resp 19    Ht 5' 1.2" (1.554 m)    Wt 297 lb 1.6 oz (134.8 kg)    SpO2 96%    BMI 55.77 kg/m  ECOG: 1  Physical Exam Constitutional:      General: She is not in acute distress.    Appearance: Normal appearance. She is not ill-appearing or diaphoretic.  HENT:     Head: Normocephalic and atraumatic.  Eyes:     General: No scleral icterus.       Right eye: No discharge.        Left eye: No discharge.     Conjunctiva/sclera: Conjunctivae normal.  Neck:     Musculoskeletal: Normal range of motion and neck supple.  Cardiovascular:     Rate and Rhythm: Normal rate and regular rhythm.     Heart sounds: Normal heart sounds. No murmur. No friction rub. No gallop.      Comments: Nipples are everted bilaterally and are without discharge. No masses, nodules, tenderness, or skin changes noted. There is a well-healed surgical incision in the superior mid left breast. Pulmonary:     Effort: Pulmonary effort is normal. No respiratory distress.     Breath sounds: Normal breath sounds. No stridor. No wheezing or rales.  Musculoskeletal:        General: Swelling and tenderness present.     Comments: Bilateral upper extremity edema greater on the right than left.  The patient expresses that she is having pain everywhere she is touched.    Lymphadenopathy:     Head:     Right side of head: No submandibular, tonsillar, preauricular, posterior auricular or occipital adenopathy.     Left side of head: No submandibular, tonsillar, preauricular, posterior auricular or occipital adenopathy.     Cervical: No cervical adenopathy.     Upper Body:     Right upper body: No supraclavicular or epitrochlear adenopathy.     Left upper body: No supraclavicular or epitrochlear adenopathy.  Skin:    General: Skin is warm and dry.     Findings: No erythema or rash.  Neurological:     Mental Status: She is alert.     Coordination: Coordination normal.  Psychiatric:        Behavior: Behavior normal.        Thought Content: Thought content normal.        Judgment: Judgment normal.     Lab Review:     Component Value Date/Time   NA 139 06/06/2018 1048   NA 139 01/07/2017 0920   K 5.1 06/06/2018 1048   K 4.1 01/07/2017 0920   CL 99 06/06/2018 1048   CL 105 12/26/2011 1538   CO2 27 06/06/2018 1048   CO2 25 01/07/2017 0920   GLUCOSE 91 06/06/2018 1048   GLUCOSE 137 (H) 05/06/2018 1215   GLUCOSE 111 01/07/2017 0920   GLUCOSE 103 (H) 12/26/2011 1538   BUN 9 06/06/2018 1048   BUN 8.7 01/07/2017 0920   CREATININE 0.66 06/06/2018 1048   CREATININE 0.75 03/03/2018 1445   CREATININE 0.8 01/07/2017 0920   CALCIUM 9.1 06/06/2018 1048   CALCIUM 8.9 01/07/2017 0920   PROT 7.5 03/03/2018 1445   PROT 7.0 01/07/2017 0920   ALBUMIN 3.5 03/03/2018 1445   ALBUMIN 3.5 01/07/2017 0920   AST 26 03/03/2018 1445   AST 47 (H) 01/07/2017 0920  ALT 25 03/03/2018 1445   ALT 54 01/07/2017 0920   ALKPHOS 93 03/03/2018 1445   ALKPHOS 73 01/07/2017 0920   BILITOT <0.2 (L) 03/03/2018 1445   BILITOT 0.22 01/07/2017 0920   GFRNONAA 97 06/06/2018 1048   GFRNONAA >60 03/03/2018 1445   GFRAA 112 06/06/2018 1048   GFRAA >60 03/03/2018 1445       Component Value Date/Time   WBC 8.7 10/09/2018 1241   WBC 10.1 05/06/2018 1215   RBC 4.78 10/09/2018  1241   RBC 4.37 05/06/2018 1215   HGB 11.5 10/09/2018 1241   HGB 11.5 (L) 01/07/2017 0920   HCT 37.7 10/09/2018 1241   HCT 37.6 01/07/2017 0920   PLT 367 10/09/2018 1241   MCV 79 10/09/2018 1241   MCV 85.6 01/07/2017 0920   MCH 24.1 (L) 10/09/2018 1241   MCH 23.8 (L) 05/06/2018 1215   MCHC 30.5 (L) 10/09/2018 1241   MCHC 28.1 (L) 05/06/2018 1215   RDW 15.5 (H) 10/09/2018 1241   RDW 14.4 01/07/2017 0920   LYMPHSABS 3.3 03/03/2018 1445   LYMPHSABS 2.0 01/07/2017 0920   MONOABS 0.8 03/03/2018 1445   MONOABS 0.6 01/07/2017 0920   EOSABS 0.3 03/03/2018 1445   EOSABS 0.3 01/07/2017 0920   BASOSABS 0.0 03/03/2018 1445   BASOSABS 0.0 01/07/2017 0920   -------------------------------  Imaging from last 24 hours (if applicable):  Radiology interpretation: Vas [place] Upper Extremity Venous Duplex  Result Date: 12/30/2018 UPPER VENOUS STUDY  Indications: Pain, and Swelling Risk Factors: None identified. Limitations: Body habitus and poor ultrasound/tissue interface. Comparison Study: No prior studies. Performing Technologist: Oliver Hum RVT  Examination Guidelines: A complete evaluation includes B-mode imaging, spectral Doppler, color Doppler, and power Doppler as needed of all accessible portions of each vessel. Bilateral testing is considered an integral part of a complete examination. Limited examinations for reoccurring indications may be performed as noted.  Right Findings: +----------+------------+---------+-----------+----------+-------+  RIGHT      Compressible Phasicity Spontaneous Properties Summary  +----------+------------+---------+-----------+----------+-------+  IJV            Full        Yes        Yes                         +----------+------------+---------+-----------+----------+-------+  Subclavian     Full        Yes        Yes                         +----------+------------+---------+-----------+----------+-------+  Axillary       Full        Yes        Yes                          +----------+------------+---------+-----------+----------+-------+  Brachial       Full        Yes        Yes                         +----------+------------+---------+-----------+----------+-------+  Radial         Full                                               +----------+------------+---------+-----------+----------+-------+  Ulnar          Full                                               +----------+------------+---------+-----------+----------+-------+  Cephalic       Full                                               +----------+------------+---------+-----------+----------+-------+  Basilic        Full                                               +----------+------------+---------+-----------+----------+-------+  Left Findings: +----------+------------+---------+-----------+----------+-------+  LEFT       Compressible Phasicity Spontaneous Properties Summary  +----------+------------+---------+-----------+----------+-------+  Subclavian     Full        Yes        Yes                         +----------+------------+---------+-----------+----------+-------+  Summary:  Right: No evidence of deep vein thrombosis in the upper extremity. No evidence of superficial vein thrombosis in the upper extremity.  Left: No evidence of thrombosis in the subclavian.  *See table(s) above for measurements and observations.    Preliminary

## 2019-01-04 ENCOUNTER — Other Ambulatory Visit: Payer: Self-pay | Admitting: Nurse Practitioner

## 2019-01-04 DIAGNOSIS — M797 Fibromyalgia: Secondary | ICD-10-CM

## 2019-01-05 ENCOUNTER — Telehealth: Payer: Self-pay | Admitting: *Deleted

## 2019-01-05 NOTE — Telephone Encounter (Signed)
Received call from pt stating she is due for her yearly bilateral mammogram with tomo.  Pt states she will not be able to tolerate standing for a mammogram this year due to severe lymphedema she is experiencing.  Pt requesting Dr. Geralyn Flash advice to receive a different type of breast screening this year that would allow the pt to lay down during the exam.  RN will consult with MD to determine best course of action.

## 2019-01-06 ENCOUNTER — Other Ambulatory Visit: Payer: Self-pay | Admitting: Hematology and Oncology

## 2019-01-06 ENCOUNTER — Other Ambulatory Visit: Payer: Self-pay | Admitting: *Deleted

## 2019-01-06 DIAGNOSIS — Z1231 Encounter for screening mammogram for malignant neoplasm of breast: Secondary | ICD-10-CM

## 2019-01-06 DIAGNOSIS — Z853 Personal history of malignant neoplasm of breast: Secondary | ICD-10-CM

## 2019-01-06 NOTE — Telephone Encounter (Signed)
Needs refill

## 2019-01-06 NOTE — Progress Notes (Signed)
Per Dr. Lindi Adie, pt to have mammogram in 3 months which will allow her more time to work with the lymphedema therapist.  Pt notified and verbalized understanding.

## 2019-01-07 ENCOUNTER — Ambulatory Visit: Payer: Medicare Other | Attending: Hematology and Oncology

## 2019-01-15 ENCOUNTER — Encounter: Payer: Self-pay | Admitting: Nurse Practitioner

## 2019-01-15 ENCOUNTER — Ambulatory Visit (INDEPENDENT_AMBULATORY_CARE_PROVIDER_SITE_OTHER): Payer: Medicare Other | Admitting: Nurse Practitioner

## 2019-01-15 ENCOUNTER — Other Ambulatory Visit: Payer: Self-pay

## 2019-01-15 VITALS — BP 132/88 | HR 79 | Temp 98.4°F | Ht 62.2 in | Wt 291.2 lb

## 2019-01-15 DIAGNOSIS — I10 Essential (primary) hypertension: Secondary | ICD-10-CM

## 2019-01-15 DIAGNOSIS — Z23 Encounter for immunization: Secondary | ICD-10-CM | POA: Diagnosis not present

## 2019-01-15 DIAGNOSIS — M797 Fibromyalgia: Secondary | ICD-10-CM

## 2019-01-15 MED ORDER — MELOXICAM 7.5 MG PO TABS: 7.5000 mg | ORAL_TABLET | Freq: Every day | ORAL | 1 refills | Status: DC

## 2019-01-15 NOTE — Progress Notes (Signed)
Subjective:     Patient ID: Ashley Lindsey , female    DOB: 30-Jan-1961 , 58 y.o.   MRN: [id number]   Chief Complaint  Patient presents with  . Hypertension    patient went to the urgent care back in july because her bp was high and she was having some nausea..  . Referral    patient wants a referral to a rheumotologist for her arm    HPI  Blood pressure readings at home have been averaging 150/75 at home.   She would like to try her sleeve and exercise for her right shoulder before referral to Rheumatologist.  She is eating more fish and trying to avoid meat. Tues and Demetrius Charity she is doing a intermittent fasting.    Wt Readings from Last 3 Encounters: 01/15/19 : 291 lb 3.2 oz (132.1 kg) 12/30/18 : 297 lb 1.6 oz (134.8 kg) 10/09/18 : 289 lb (131.1 kg)   Hypertension This is a chronic problem. The current episode started more than 1 month ago. The problem has been gradually improving since onset. The problem is controlled. Pertinent negatives include no anxiety, chest pain, headaches or shortness of breath. Risk factors for coronary artery disease include obesity. Past treatments include diuretics and beta blockers. Compliance problems: she is increasing her exercise with the bike this week.  There is no history of angina. There is no history of chronic renal disease or a hypertension causing med.     Past Medical History:  Diagnosis Date  . Abdominal cramps   . Arthritis   . Cancer [facility])    s/p radiation- last dose 6/12//has been off Tamoxifen 8/12  . Chronic headaches   . Costochondritis   . Fatigue   . Fibromyalgia 03/06/2013  . Lymphedema    RIGHT ARM-////  STATES USE LEFT ARM FOR BP'S  . Migraines   . Muscle spasms of head and/or neck    following to the breast  . Passed out    4th of july  . Personal history of radiation therapy 2010   lt breast   . S/P radiation therapy 08/19/07 - 10/10/07   Left Breast/5040 cGy/28 fractions with Boost for a toatl dose of 6300 dGy  .  S/P radiation therapy 08/14/10 -10/03/10   right breast  . Sleep apnea    STOP BANG SCORE 5     Family History  Problem Relation Age of Onset  . Breast cancer Mother 4  . Heart disease Father   . Prostate cancer Maternal Uncle 78  . Cancer Maternal Aunt 63       d. from "female cancer" possibly cervical  . Breast cancer Maternal Aunt 60  . Prostate cancer Paternal Uncle   . Prostate cancer Maternal Grandfather 41  . Prostate cancer Maternal Uncle 80  . Prostate cancer Paternal Uncle   . Breast cancer Cousin 64       mat 1st cousin     Current Outpatient Medications:  .  albuterol (PROAIR HFA) 108 (90 Base) MCG/ACT inhaler, Inhale 1-2 puffs into the lungs every 6 (six) hours as needed for wheezing or shortness of breath., Disp: 1 Inhaler, Rfl: 1 .  Bisacodyl (DULCOLAX PO), Take 1 capsule by mouth daily as needed (constipation). , Disp: , Rfl:  .  cholecalciferol 5000 units TABS, Take 1 tablet (5,000 Units total) by mouth 2 (two) times daily. (Patient taking differently: Take 5,000 Units by mouth daily. ), Disp: , Rfl:  .  cyclobenzaprine (FLEXERIL) 10 MG  tablet, TAKE 1 TABLET BY MOUTH TWICE A DAY AS NEEDED FOR MUSCLE SPASM, Disp: 30 tablet, Rfl: 0 .  diphenhydrAMINE (BENADRYL) 25 mg capsule, Take 25 mg by mouth 2 (two) times daily., Disp: , Rfl:  .  fish oil-omega-3 fatty acids 1000 MG capsule, Take 1 g by mouth daily. , Disp: , Rfl:  .  Flaxseed, Linseed, (FLAXSEED OIL MAX STR PO), Take 1 capsule by mouth daily. , Disp: , Rfl:  .  hydrochlorothiazide (HYDRODIURIL) 12.5 MG tablet, TAKE 1 TABLET BY MOUTH EVERY DAY, Disp: 90 tablet, Rfl: 0 .  Iron-FA-B Cmp-C-Biot-Probiotic (FUSION PLUS) CAPS, Take 1 capsule by mouth daily., Disp: 30 capsule, Rfl: 3 .  Melatonin 5 MG TABS, Take 1 tablet by mouth., Disp: , Rfl:  .  meloxicam (MOBIC) 7.5 MG tablet, Take 1 tablet (7.5 mg total) by mouth daily., Disp: 30 tablet, Rfl: 1 .  Multiple Vitamins-Minerals (MULTIVITAMIN & MINERAL PO), Take 1 tablet  by mouth daily., Disp: , Rfl:  .  omeprazole (PRILOSEC) 20 MG capsule, Take 20 mg by mouth daily., Disp: , Rfl:  .  promethazine (PHENERGAN) 25 MG tablet, TAKE 1 TABLET (25 MG TOTAL) BY MOUTH EVERY 6 (SIX) HOURS AS NEEDED FOR NAUSEA OR VOMITING., Disp: 30 tablet, Rfl: 0 .  propranolol (INDERAL) 20 MG tablet, Take 1 tablet (20 mg total) by mouth 2 (two) times daily., Disp: 180 tablet, Rfl: 0 .  senna (SENOKOT) 8.6 MG tablet, Take 1 tablet by mouth daily as needed for constipation. , Disp: , Rfl:  .  SYMBICORT 80-4.5 MCG/ACT inhaler, INHALE 2 PUFF BY INHALATION ROUTE EVERY DAY IN THE MORNING AND EVENING, Disp: 30.6 Inhaler, Rfl: 1 .  triamcinolone cream (KENALOG) 0.1 %, Apply 1 application topically 2 (two) times daily. Apply for up to one week. (Patient taking differently: Apply 1 application topically 2 (two) times daily as needed (rash). ), Disp: 30 g, Rfl: 0 .  UNABLE TO FIND, Rx: L8000-Post Surgical Bra (Quantity: 6) H4742- Silicone Breast Prosthesis (Quantity: 2) Dx: 174.9; Bilateral partial mastectomy Replacement due to hot flashes since previous items were too heavy and hot, Disp: 1 each, Rfl: 0 .  venlafaxine XR (EFFEXOR-XR) 150 MG 24 hr capsule, Take 1 capsule (150 mg total) by mouth See admin instructions. Nightly with 75 mg capsule, total of 225 mg nightly, Disp: 90 capsule, Rfl: 3 .  venlafaxine XR (EFFEXOR-XR) 75 MG 24 hr capsule, TAKE 1 CAPSULE DAILY ALONG WITH 150MG CAP TO MAKE IT 225MG TOTAL, Disp: 90 capsule, Rfl: 0 .  vitamin E 200 UNIT capsule, Take 200 Units by mouth daily., Disp: , Rfl:  .  acetaminophen (TYLENOL) 650 MG CR tablet, Take 650 mg by mouth every 8 (eight) hours as needed for pain., Disp: , Rfl:    Allergies  Allergen Reactions  . Aspirin     unknown  . Penicillins Hives and Swelling    Has patient had a PCN reaction causing immediate rash, facial/tongue/throat swelling, SOB or lightheadedness with hypotension: Yes Has patient had a PCN reaction causing severe rash  involving mucus membranes or skin necrosis: Yes Has patient had a PCN reaction that required hospitalization Yes Has patient had a PCN reaction occurring within the last 10 years: No If all of the above answers are "NO", then may proceed with Cephalosporin use.      Review of Systems  Constitutional: Negative.  Negative for fatigue.  Respiratory: Negative.  Negative for shortness of breath.   Cardiovascular: Negative for chest pain.  Musculoskeletal: Positive for arthralgias (right knee ).       Right arm pain and lymphedema  Neurological: Negative.  Negative for dizziness and headaches.     Today's Vitals   01/15/19 1044  BP: 132/88  Pulse: 79  Temp: 98.4 F (36.9 C)  TempSrc: Oral  Weight: 291 lb 3.2 oz (132.1 kg)  Height: 5' 2.2" (1.58 m)  PainSc: 0-No pain   Body mass index is 52.92 kg/m.   Objective:  Physical Exam Constitutional:      Appearance: Normal appearance.  Cardiovascular:     Rate and Rhythm: Normal rate and regular rhythm.     Pulses: Normal pulses.     Heart sounds: Normal heart sounds. No murmur.  Pulmonary:     Effort: Pulmonary effort is normal. No respiratory distress.     Breath sounds: Normal breath sounds.  Musculoskeletal: Normal range of motion.  Skin:    General: Skin is warm and dry.     Capillary Refill: Capillary refill takes less than 2 seconds.  Neurological:     General: No focal deficit present.     Mental Status: She is alert and oriented to person, place, and time.         Assessment And Plan:     1. Essential hypertension . B/P is fairly controlled.  . CMP ordered to check renal function.  . The importance of regular exercise and dietary modification was stressed to the patient.  - BMP8+eGFR  2. Need for influenza vaccination  Influenza vaccine given in office  Advised to take Tylenol as needed for muscle aches or fever - Flu Vaccine QUAD 6+ mos PF IM (Fluarix Quad PF)  3. Fibromyalgia  Chronic  Mostly  stable - meloxicam (MOBIC) 7.5 MG tablet; Take 1 tablet (7.5 mg total) by mouth daily.  Dispense: 90 tablet; Refill: 1   [name], FNP    THE PATIENT IS ENCOURAGED TO PRACTICE SOCIAL DISTANCING DUE TO THE COVID-19 PANDEMIC.

## 2019-01-16 LAB — BMP8+EGFR
BUN/Creatinine Ratio: 11 (ref 9–23)
BUN: 10 mg/dL (ref 6–24)
CO2: 24 mmol/L (ref 20–29)
Calcium: 9.2 mg/dL (ref 8.7–10.2)
Chloride: 103 mmol/L (ref 96–106)
Creatinine, Ser: 0.88 mg/dL (ref 0.57–1.00)
GFR calc Af Amer: 83 mL/min/{1.73_m2} (ref >59)
GFR calc non Af Amer: 72 mL/min/{1.73_m2} (ref >59)
Glucose: 78 mg/dL (ref 65–99)
Potassium: 4.3 mmol/L (ref 3.5–5.2)
Sodium: 141 mmol/L (ref 134–144)

## 2019-01-19 ENCOUNTER — Other Ambulatory Visit: Payer: Self-pay | Admitting: Nurse Practitioner

## 2019-01-19 MED ORDER — CYCLOBENZAPRINE HCL 10 MG PO TABS: 10.0000 mg | ORAL_TABLET | Freq: Three times a day (TID) | ORAL | 1 refills | Status: DC | PRN

## 2019-01-20 ENCOUNTER — Encounter: Payer: Self-pay | Admitting: Nurse Practitioner

## 2019-01-20 DIAGNOSIS — Z23 Encounter for immunization: Secondary | ICD-10-CM | POA: Diagnosis not present

## 2019-01-20 DIAGNOSIS — I10 Essential (primary) hypertension: Secondary | ICD-10-CM | POA: Diagnosis not present

## 2019-02-20 ENCOUNTER — Encounter: Payer: Self-pay | Admitting: Nurse Practitioner

## 2019-02-20 ENCOUNTER — Other Ambulatory Visit: Payer: Self-pay | Admitting: Nurse Practitioner

## 2019-02-20 DIAGNOSIS — R030 Elevated blood-pressure reading, without diagnosis of hypertension: Secondary | ICD-10-CM

## 2019-02-21 ENCOUNTER — Other Ambulatory Visit: Payer: Self-pay | Admitting: Hematology and Oncology

## 2019-02-21 MED ORDER — HYDROCHLOROTHIAZIDE 12.5 MG PO TABS: 12.5000 mg | ORAL_TABLET | Freq: Every day | ORAL | 0 refills | Status: DC

## 2019-02-21 MED ORDER — PROMETHAZINE HCL 25 MG PO TABS: 25.0000 mg | ORAL_TABLET | Freq: Four times a day (QID) | ORAL | 0 refills | Status: DC | PRN

## 2019-03-09 ENCOUNTER — Other Ambulatory Visit: Payer: Self-pay | Admitting: Medical

## 2019-03-17 ENCOUNTER — Other Ambulatory Visit: Payer: Self-pay | Admitting: Hematology and Oncology

## 2019-03-24 ENCOUNTER — Other Ambulatory Visit: Payer: Self-pay | Admitting: Nurse Practitioner

## 2019-03-24 NOTE — Telephone Encounter (Signed)
Flexeril: refill ° °

## 2019-03-31 ENCOUNTER — Ambulatory Visit: Payer: Medicare Other | Admitting: Orthopaedic Surgery

## 2019-04-03 ENCOUNTER — Encounter (HOSPITAL_COMMUNITY): Payer: Self-pay

## 2019-04-03 ENCOUNTER — Ambulatory Visit (HOSPITAL_COMMUNITY)
Admission: EM | Admit: 2019-04-03 | Discharge: 2019-04-03 | Disposition: A | Discharge status: Home / Self Care | Payer: Medicare Other | Attending: Family Medicine | Admitting: Family Medicine

## 2019-04-03 ENCOUNTER — Other Ambulatory Visit: Payer: Self-pay

## 2019-04-03 DIAGNOSIS — B354 Tinea corporis: Secondary | ICD-10-CM

## 2019-04-03 MED ORDER — TERBINAFINE HCL 250 MG PO TABS: 250.0000 mg | ORAL_TABLET | Freq: Every day | ORAL | 0 refills | Status: DC

## 2019-04-03 NOTE — ED Triage Notes (Signed)
Patient presents to Urgent Care with complaints of itchy rash on bilateral feet and bilateral shoulders near breasts since about a month ago. Patient reports she was treated for eczema in the past and triamcinolone cream worked.

## 2019-04-03 NOTE — Discharge Instructions (Signed)
Take the medication as prescribed.  Take once daily for 2 weeks.  You can keep using the cream OTC for itching.  Follow up as needed for continued or worsening symptoms

## 2019-04-03 NOTE — ED Provider Notes (Signed)
[place]    CSN: [id number] Arrival date & time: 04/03/19  1331      History   Chief Complaint Chief Complaint  Patient presents with  . Rash    HPI Ashley Lindsey is a 58 y.o. female.   Pt is a 58 year old female that presents with rash. This has been ongoing x 1 month. The rash it located to bilateral feet, shoulder near her breasts. Rash is very itchy. She has been using old triamcinolone prescription and Lotrimin without much relief.    Denies any fever, joint pain. Denies any recent changes in lotions, detergents, foods or other possible irritants. No recent travel. Nobody else at home has the rash. Patient has been outside but denies any contact with plants or insects. No new foods or medications.   ROS per HPI       Past Medical History:  Diagnosis Date  . Abdominal cramps   . Arthritis   . Cancer [facility])    s/p radiation- last dose 6/12//has been off Tamoxifen 8/12  . Chronic headaches   . Costochondritis   . Fatigue   . Fibromyalgia 03/06/2013  . Lymphedema    RIGHT ARM-////  STATES USE LEFT ARM FOR BP'S  . Migraines   . Muscle spasms of head and/or neck    following to the breast  . Passed out    4th of july  . Personal history of radiation therapy 2010   lt breast   . S/P radiation therapy 08/19/07 - 10/10/07   Left Breast/5040 cGy/28 fractions with Boost for a toatl dose of 6300 dGy  . S/P radiation therapy 08/14/10 -10/03/10   right breast  . Sleep apnea    STOP BANG SCORE 5    Patient Active Problem List   Diagnosis Date Noted  . Class 3 severe obesity due to excess calories without serious comorbidity with body mass index (BMI) of 40.0 to 44.9 in adult ([place]) 08/05/2018  . Depression 08/05/2018  . Chronic nonintractable headache 08/05/2018  . Elevated blood-pressure reading without diagnosis of hypertension 06/06/2018  . Chronic pain of right ankle 04/24/2018  . Breast cancer of lower-outer quadrant of right female breast ([place])  12/02/2014  . Bronchospasm 09/21/2014  . Dyspnea 09/21/2014  . Chronic pain of right knee 09/21/2014  . Fibromyalgia 03/06/2013  . Dense breasts 03/06/2013  . Mastalgia 03/06/2013  . Back pain, lumbosacral 03/06/2013  . Lymphedema of arm 03/06/2013  . Atypical chest pain 01/06/2013  . Breast cancer of upper-inner quadrant of left female breast ([place]) 09/08/2012  . Family history of malignant neoplasm of breast 09/08/2012  . S/P radiation therapy   . History of breast cancer in female 08/13/2011    Past Surgical History:  Procedure Laterality Date  . ABDOMINAL HYSTERECTOMY     with left salpingooophorectomy  . BREAST LUMPECTOMY  2009/2012   LEFT/RIGHT with lymph node dissection  . KNEE ARTHROSCOPY     right  . OOPHORECTOMY  2013    OB History   No obstetric history on file.      Home Medications    Prior to Admission medications   Medication Sig Start Date End Date Taking? Authorizing Provider  acetaminophen (TYLENOL) 650 MG CR tablet Take 650 mg by mouth every 8 (eight) hours as needed for pain.    [provider]  albuterol (PROAIR HFA) 108 (90 Base) MCG/ACT inhaler Inhale 1-2 puffs into the lungs every 6 (six) hours as needed for wheezing  or shortness of breath. 07/17/18   Tanner, Lyndon Code., PA-C  Bisacodyl (DULCOLAX PO) Take 1 capsule by mouth daily as needed (constipation).     [provider]  cholecalciferol 5000 units TABS Take 1 tablet (5,000 Units total) by mouth 2 (two) times daily. Patient taking differently: Take 5,000 Units by mouth daily.  07/21/15   Nicholas Lose, MD  cyclobenzaprine (FLEXERIL) 10 MG tablet TAKE 1 TABLET BY MOUTH THREE TIMES A DAY AS NEEDED FOR MUSCLE SPASMS 03/26/19   [name], FNP  diphenhydrAMINE (BENADRYL) 25 mg capsule Take 25 mg by mouth 2 (two) times daily.    [provider]  fish oil-omega-3 fatty acids 1000 MG capsule Take 1 g by mouth daily.     [provider]  Flaxseed, Linseed, (FLAXSEED OIL  MAX STR PO) Take 1 capsule by mouth daily.     [provider]  hydrochlorothiazide (HYDRODIURIL) 12.5 MG tablet Take 1 tablet (12.5 mg total) by mouth daily. 02/21/19   [name], FNP  Iron-FA-B Cmp-C-Biot-Probiotic (FUSION PLUS) CAPS Take 1 capsule by mouth daily. 06/23/18   [name], FNP  Melatonin 5 MG TABS Take 1 tablet by mouth.    [provider]  meloxicam (MOBIC) 7.5 MG tablet Take 1 tablet (7.5 mg total) by mouth daily. 01/15/19   [name], FNP  Multiple Vitamins-Minerals (MULTIVITAMIN & MINERAL PO) Take 1 tablet by mouth daily.    [provider]  omeprazole (PRILOSEC) 20 MG capsule Take 20 mg by mouth daily.    [provider]  promethazine (PHENERGAN) 25 MG tablet Take 1 tablet (25 mg total) by mouth every 6 (six) hours as needed for nausea or vomiting. 02/21/19   [name], FNP  propranolol (INDERAL) 20 MG tablet Take 1 tablet (20 mg total) by mouth 2 (two) times daily. 11/11/18   Raylene Everts, MD  senna (SENOKOT) 8.6 MG tablet Take 1 tablet by mouth daily as needed for constipation.     [provider]  SYMBICORT 80-4.5 MCG/ACT inhaler INHALE 2 PUFF BY INHALATION ROUTE EVERY DAY IN THE MORNING AND EVENING 06/20/18   [name], FNP  terbinafine (LAMISIL) 250 MG tablet Take 1 tablet (250 mg total) by mouth daily. 04/03/19   Loura Halt A, NP  triamcinolone cream (KENALOG) 0.1 % Apply 1 application topically 2 (two) times daily. Apply for up to one week. Patient taking differently: Apply 1 application topically 2 (two) times daily as needed (rash).  12/29/16   Vanessa Kick, MD  UNABLE TO FIND Rx: L8000-Post Surgical Bra (Quantity: 6) [id number]- Silicone Breast Prosthesis (Quantity: 2) Dx: 174.9; Bilateral partial mastectomy Replacement due to hot flashes since previous items were too heavy and hot 01/20/13   Fanny Skates, MD  venlafaxine XR (EFFEXOR-XR) 150 MG 24 hr capsule TAKE 1 CAPSULE BY MOUTH SEE ADMIN INSTRUCTIONS.  NIGHTLY WITH 75 MG CAPSULE, TOTAL OF 225 MG NIGHTLY 03/17/19   Nicholas Lose, MD  venlafaxine XR (EFFEXOR-XR) 75 MG 24 hr capsule TAKE 1 CAPSULE DAILY ALONG WITH 150MG  CAP TO MAKE IT 225MG  TOTAL 02/23/19   Nicholas Lose, MD  vitamin E 200 UNIT capsule Take 200 Units by mouth daily.    [provider]    Family History Family History  Problem Relation Age of Onset  . Breast cancer Mother 59  . Heart disease Father   . Prostate cancer Maternal Uncle 78  . Cancer Maternal Aunt 63       d. from "female cancer"  possibly cervical  . Breast cancer Maternal Aunt 60  . Prostate cancer Paternal Uncle   . Prostate cancer Maternal Grandfather 61  . Prostate cancer Maternal Uncle 80  . Prostate cancer Paternal Uncle   . Breast cancer Cousin 69       mat 1st cousin    Social History Social History   Tobacco Use  . Smoking status: Former Smoker    Packs/day: 0.25    Years: 1.00    Pack years: 0.25    Types: Cigarettes  . Smokeless tobacco: Never Used  Substance Use Topics  . Alcohol use: Not Currently  . Drug use: No     Allergies   Aspirin and Penicillins   Review of Systems Review of Systems   Physical Exam Triage Vital Signs ED Triage Vitals  Enc Vitals Group     BP 04/03/19 1429 121/85     Pulse Rate 04/03/19 1429 84     Resp 04/03/19 1429 16     Temp 04/03/19 1429 98.2 F (36.8 C)     Temp Source 04/03/19 1429 Oral     SpO2 04/03/19 1429 97 %     Weight --      Height --      Head Circumference --      Peak Flow --      Pain Score 04/03/19 1426 0     Pain Loc --      Pain Edu? --      Excl. in [place]? --    No data found.  Updated Vital Signs BP 121/85 (BP Location: Left Arm)   Pulse 84   Temp 98.2 F (36.8 C) (Oral)   Resp 16   SpO2 97%   Visual Acuity Right Eye Distance:   Left Eye Distance:   Bilateral Distance:    Right Eye Near:   Left Eye Near:    Bilateral Near:     Physical Exam Vitals and nursing note reviewed.  Constitutional:       General: She is not in acute distress.    Appearance: Normal appearance. She is not ill-appearing, toxic-appearing or diaphoretic.  HENT:     Head: Normocephalic.     Nose: Nose normal.     Mouth/Throat:     Pharynx: Oropharynx is clear.  Eyes:     Conjunctiva/sclera: Conjunctivae normal.  Pulmonary:     Effort: Pulmonary effort is normal.  Musculoskeletal:        General: Normal range of motion.     Cervical back: Normal range of motion.  Skin:    General: Skin is warm and dry.     Findings: Rash present.     Comments: See picture for detail.   Neurological:     Mental Status: She is alert.  Psychiatric:        Mood and Affect: Mood normal.          UC Treatments / Results  Labs (all labs ordered are listed, but only abnormal results are displayed) Labs Reviewed - No data to display  EKG   Radiology No results found.  Procedures Procedures (including critical care time)  Medications Ordered in UC Medications - No data to display  Initial Impression / Assessment and Plan / UC Course  I have reviewed the triage vital signs and the nursing notes.  Pertinent labs & imaging results that were available during my care of the patient were reviewed by me and considered in my medical decision making (see  chart for details).     Tinea corporis-rash consistent with this.  Will treat with terbinafine x2 weeks.  Over-the-counter cream and Benadryl as needed.  Follow up as needed for continued or worsening symptoms  Final Clinical Impressions(s) / UC Diagnoses   Final diagnoses:  Tinea corporis     Discharge Instructions     Take the medication as prescribed.  Take once daily for 2 weeks.  You can keep using the cream OTC for itching.  Follow up as needed for continued or worsening symptoms     ED Prescriptions    Medication Sig Dispense Auth. Provider   terbinafine (LAMISIL) 250 MG tablet Take 1 tablet (250 mg total) by mouth daily. 14 tablet Jacobb Alen,  Talajah Slimp A, NP     PDMP not reviewed this encounter.   Orvan July, NP 04/06/19 [phone]

## 2019-04-07 ENCOUNTER — Encounter: Payer: Self-pay | Admitting: Nurse Practitioner

## 2019-04-07 ENCOUNTER — Other Ambulatory Visit: Payer: Self-pay

## 2019-04-07 ENCOUNTER — Ambulatory Visit (INDEPENDENT_AMBULATORY_CARE_PROVIDER_SITE_OTHER): Payer: Medicare Other | Admitting: Nurse Practitioner

## 2019-04-07 VITALS — BP 134/80 | HR 85 | Temp 97.9°F | Ht 62.2 in | Wt 293.4 lb

## 2019-04-07 DIAGNOSIS — I10 Essential (primary) hypertension: Secondary | ICD-10-CM | POA: Diagnosis not present

## 2019-04-07 DIAGNOSIS — R21 Rash and other nonspecific skin eruption: Secondary | ICD-10-CM | POA: Diagnosis not present

## 2019-04-07 DIAGNOSIS — M25561 Pain in right knee: Secondary | ICD-10-CM | POA: Diagnosis not present

## 2019-04-07 DIAGNOSIS — R519 Headache, unspecified: Secondary | ICD-10-CM | POA: Diagnosis not present

## 2019-04-07 DIAGNOSIS — G8929 Other chronic pain: Secondary | ICD-10-CM

## 2019-04-07 MED ORDER — EMGALITY 120 MG/ML ~~LOC~~ SOAJ: 1.0000 | SUBCUTANEOUS | 6 refills | Status: DC

## 2019-04-07 NOTE — Progress Notes (Signed)
Subjective:     Patient ID: Ashley Lindsey , female    DOB: [birth year] , 58 y.o.   MRN: [id number]   Chief Complaint  Patient presents with  . Hypertension    HPI  Blood pressure readings at home have been averaging 150/75 at home.    Wt Readings from Last 3 Encounters: 04/07/19 : 293 lb 6.4 oz (133.1 kg) 01/15/19 : 291 lb 3.2 oz (132.1 kg) 12/30/18 : 297 lb 1.6 oz (134.8 kg)    Hypertension This is a chronic problem. The current episode started more than 1 month ago. The problem has been gradually improving since onset. The problem is controlled. Pertinent negatives include no anxiety, chest pain, headaches or shortness of breath. Risk factors for coronary artery disease include obesity. Past treatments include diuretics and beta blockers. Compliance problems: she is increasing her exercise with the bike this week.  There is no history of angina. There is no history of chronic renal disease or a hypertension causing med.     Past Medical History:  Diagnosis Date  . Abdominal cramps   . Arthritis   . Cancer [facility])    s/p radiation- last dose 6/12//has been off Tamoxifen 8/12  . Chronic headaches   . Costochondritis   . Fatigue   . Fibromyalgia 03/06/2013  . Lymphedema    RIGHT ARM-////  STATES USE LEFT ARM FOR BP'S  . Migraines   . Muscle spasms of head and/or neck    following to the breast  . Passed out    4th of july  . Personal history of radiation therapy 2010   lt breast   . S/P radiation therapy 08/19/07 - 10/10/07   Left Breast/5040 cGy/28 fractions with Boost for a toatl dose of 6300 dGy  . S/P radiation therapy 08/14/10 -10/03/10   right breast  . Sleep apnea    STOP BANG SCORE 5     Family History  Problem Relation Age of Onset  . Breast cancer Mother 52  . Heart disease Father   . Prostate cancer Maternal Uncle 78  . Cancer Maternal Aunt 63       d. from "female cancer" possibly cervical  . Breast cancer Maternal Aunt 60  . Prostate cancer Paternal  Uncle   . Prostate cancer Maternal Grandfather 74  . Prostate cancer Maternal Uncle 80  . Prostate cancer Paternal Uncle   . Breast cancer Cousin 45       mat 1st cousin     Current Outpatient Medications:  .  acetaminophen (TYLENOL) 650 MG CR tablet, Take 650 mg by mouth every 8 (eight) hours as needed for pain., Disp: , Rfl:  .  albuterol (PROAIR HFA) 108 (90 Base) MCG/ACT inhaler, Inhale 1-2 puffs into the lungs every 6 (six) hours as needed for wheezing or shortness of breath., Disp: 1 Inhaler, Rfl: 1 .  Bisacodyl (DULCOLAX PO), Take 1 capsule by mouth daily as needed (constipation). , Disp: , Rfl:  .  cholecalciferol 5000 units TABS, Take 1 tablet (5,000 Units total) by mouth 2 (two) times daily. (Patient taking differently: Take 5,000 Units by mouth daily. ), Disp: , Rfl:  .  cyclobenzaprine (FLEXERIL) 10 MG tablet, TAKE 1 TABLET BY MOUTH THREE TIMES A DAY AS NEEDED FOR MUSCLE SPASMS, Disp: 30 tablet, Rfl: 1 .  diphenhydrAMINE (BENADRYL) 25 mg capsule, Take 25 mg by mouth 2 (two) times daily., Disp: , Rfl:  .  fish oil-omega-3 fatty acids 1000 MG capsule, Take  1 g by mouth daily. , Disp: , Rfl:  .  Flaxseed, Linseed, (FLAXSEED OIL MAX STR PO), Take 1 capsule by mouth daily. , Disp: , Rfl:  .  hydrochlorothiazide (HYDRODIURIL) 12.5 MG tablet, Take 1 tablet (12.5 mg total) by mouth daily., Disp: 90 tablet, Rfl: 0 .  Iron-FA-B Cmp-C-Biot-Probiotic (FUSION PLUS) CAPS, Take 1 capsule by mouth daily., Disp: 30 capsule, Rfl: 3 .  meloxicam (MOBIC) 7.5 MG tablet, Take 1 tablet (7.5 mg total) by mouth daily., Disp: 90 tablet, Rfl: 1 .  Multiple Vitamins-Minerals (MULTIVITAMIN & MINERAL PO), Take 1 tablet by mouth daily., Disp: , Rfl:  .  omeprazole (PRILOSEC) 20 MG capsule, Take 20 mg by mouth daily., Disp: , Rfl:  .  promethazine (PHENERGAN) 25 MG tablet, Take 1 tablet (25 mg total) by mouth every 6 (six) hours as needed for nausea or vomiting., Disp: 30 tablet, Rfl: 0 .  propranolol (INDERAL)  20 MG tablet, Take 1 tablet (20 mg total) by mouth 2 (two) times daily., Disp: 180 tablet, Rfl: 0 .  senna (SENOKOT) 8.6 MG tablet, Take 1 tablet by mouth daily as needed for constipation. , Disp: , Rfl:  .  SYMBICORT 80-4.5 MCG/ACT inhaler, INHALE 2 PUFF BY INHALATION ROUTE EVERY DAY IN THE MORNING AND EVENING, Disp: 30.6 Inhaler, Rfl: 1 .  terbinafine (LAMISIL) 250 MG tablet, Take 1 tablet (250 mg total) by mouth daily., Disp: 14 tablet, Rfl: 0 .  triamcinolone cream (KENALOG) 0.1 %, Apply 1 application topically 2 (two) times daily. Apply for up to one week. (Patient taking differently: Apply 1 application topically 2 (two) times daily as needed (rash). ), Disp: 30 g, Rfl: 0 .  UNABLE TO FIND, Rx: L8000-Post Surgical Bra (Quantity: 6) [id number]- Silicone Breast Prosthesis (Quantity: 2) Dx: 174.9; Bilateral partial mastectomy Replacement due to hot flashes since previous items were too heavy and hot, Disp: 1 each, Rfl: 0 .  venlafaxine XR (EFFEXOR-XR) 150 MG 24 hr capsule, TAKE 1 CAPSULE BY MOUTH SEE ADMIN INSTRUCTIONS. NIGHTLY WITH 75 MG CAPSULE, TOTAL OF 225 MG NIGHTLY, Disp: 90 capsule, Rfl: 3 .  venlafaxine XR (EFFEXOR-XR) 75 MG 24 hr capsule, TAKE 1 CAPSULE DAILY ALONG WITH 150MG  CAP TO MAKE IT 225MG  TOTAL, Disp: 90 capsule, Rfl: 0 .  vitamin E 200 UNIT capsule, Take 200 Units by mouth daily., Disp: , Rfl:  .  Melatonin 5 MG TABS, Take 1 tablet by mouth., Disp: , Rfl:    Allergies  Allergen Reactions  . Aspirin     unknown  . Penicillins Hives and Swelling    Has patient had a PCN reaction causing immediate rash, facial/tongue/throat swelling, SOB or lightheadedness with hypotension: Yes Has patient had a PCN reaction causing severe rash involving mucus membranes or skin necrosis: Yes Has patient had a PCN reaction that required hospitalization Yes Has patient had a PCN reaction occurring within the last 10 years: No If all of the above answers are "NO", then may proceed with Cephalosporin  use.      Review of Systems  Constitutional: Negative.  Negative for fatigue.  Respiratory: Negative.  Negative for shortness of breath.   Cardiovascular: Negative for chest pain.  Musculoskeletal: Positive for arthralgias (right knee ).       Right arm pain and lymphedema  Skin: Positive for rash (rash to  posterior calf of right leg and left chest).  Neurological: Negative.  Negative for dizziness and headaches.     Today's Vitals   04/07/19 1147  BP: 134/80  Pulse: 85  Temp: 97.9 F (36.6 C)  TempSrc: Oral  Weight: 293 lb 6.4 oz (133.1 kg)  Height: 5' 2.2" (1.58 m)  PainSc: 8   PainLoc: Knee   Body mass index is 53.32 kg/m.   Objective:  Physical Exam Constitutional:      General: She is not in acute distress.    Appearance: Normal appearance. She is obese.  Cardiovascular:     Rate and Rhythm: Normal rate and regular rhythm.     Pulses: Normal pulses.     Heart sounds: Normal heart sounds. No murmur.  Pulmonary:     Effort: Pulmonary effort is normal. No respiratory distress.     Breath sounds: Normal breath sounds.  Musculoskeletal:        General: Normal range of motion.  Skin:    General: Skin is warm and dry.     Capillary Refill: Capillary refill takes less than 2 seconds.     Findings: Rash (right calf and left chest with erythematous rash) present.  Neurological:     General: No focal deficit present.     Mental Status: She is alert and oriented to person, place, and time.         Assessment And Plan:     1. Essential hypertension . B/P is fair controlled.  . CMP ordered to check renal function.  . The importance of regular exercise and dietary modification was stressed to the patient.  . Stressed importance of losing ten percent of her body weight to help with B/P control.  . The weight loss would help with decreasing cardiac and cancer risk as well.   2. Rash and nonspecific skin eruption  Erythematous rash to chest and lower  extremities  She is also being followed by Dermatology with a cream  3. Chronic pain of right knee  Continues to have right knee pain and using pain cream as needed which is not as effective.  4. Chronic nonintractable headache, unspecified headache type  Will try her on Emgality due to the frequency of headaches   First dose given in office with sample       [name], FNP    THE PATIENT IS ENCOURAGED TO PRACTICE SOCIAL DISTANCING DUE TO THE COVID-19 PANDEMIC.

## 2019-04-08 ENCOUNTER — Telehealth: Payer: Self-pay

## 2019-04-08 NOTE — Telephone Encounter (Signed)
I have submitted the PA for Emgality on covermymeds and we are waiting for the determination from her ins. YRL,RMA

## 2019-04-11 ENCOUNTER — Encounter: Payer: Self-pay | Admitting: Nurse Practitioner

## 2019-04-12 ENCOUNTER — Encounter: Payer: Self-pay | Admitting: Nurse Practitioner

## 2019-04-13 ENCOUNTER — Other Ambulatory Visit: Payer: Self-pay | Admitting: Nurse Practitioner

## 2019-04-13 DIAGNOSIS — R21 Rash and other nonspecific skin eruption: Secondary | ICD-10-CM

## 2019-04-13 MED ORDER — PREDNISONE 10 MG (21) PO TBPK: ORAL_TABLET | ORAL | 0 refills | Status: DC

## 2019-04-15 ENCOUNTER — Ambulatory Visit: Payer: Medicare Other | Admitting: Orthopaedic Surgery

## 2019-04-17 ENCOUNTER — Encounter: Payer: Self-pay | Admitting: Nurse Practitioner

## 2019-04-18 ENCOUNTER — Other Ambulatory Visit: Payer: Self-pay | Admitting: Hematology and Oncology

## 2019-04-23 ENCOUNTER — Other Ambulatory Visit: Payer: Self-pay | Admitting: Nurse Practitioner

## 2019-04-23 ENCOUNTER — Encounter: Payer: Self-pay | Admitting: Nurse Practitioner

## 2019-04-23 DIAGNOSIS — R21 Rash and other nonspecific skin eruption: Secondary | ICD-10-CM

## 2019-04-27 ENCOUNTER — Other Ambulatory Visit: Payer: Self-pay

## 2019-04-27 ENCOUNTER — Other Ambulatory Visit: Payer: Self-pay | Admitting: Nurse Practitioner

## 2019-04-27 DIAGNOSIS — G8929 Other chronic pain: Secondary | ICD-10-CM

## 2019-04-27 DIAGNOSIS — R21 Rash and other nonspecific skin eruption: Secondary | ICD-10-CM

## 2019-04-27 DIAGNOSIS — R519 Headache, unspecified: Secondary | ICD-10-CM

## 2019-04-28 ENCOUNTER — Other Ambulatory Visit: Payer: Self-pay | Admitting: Nurse Practitioner

## 2019-04-28 ENCOUNTER — Other Ambulatory Visit: Payer: Self-pay | Admitting: Hematology and Oncology

## 2019-04-28 DIAGNOSIS — R030 Elevated blood-pressure reading, without diagnosis of hypertension: Secondary | ICD-10-CM

## 2019-04-28 NOTE — Telephone Encounter (Signed)
Refill request Cyclobenzaprine 10MG 

## 2019-05-04 ENCOUNTER — Other Ambulatory Visit: Payer: Self-pay | Admitting: Nurse Practitioner

## 2019-05-04 ENCOUNTER — Encounter: Payer: Self-pay | Admitting: Nurse Practitioner

## 2019-05-04 DIAGNOSIS — R519 Headache, unspecified: Secondary | ICD-10-CM

## 2019-05-04 DIAGNOSIS — G8929 Other chronic pain: Secondary | ICD-10-CM

## 2019-05-06 ENCOUNTER — Other Ambulatory Visit: Payer: Self-pay | Admitting: Nurse Practitioner

## 2019-05-06 MED ORDER — PROMETHAZINE HCL 25 MG PO TABS: 25.0000 mg | ORAL_TABLET | Freq: Four times a day (QID) | ORAL | 0 refills | Status: DC | PRN

## 2019-05-11 NOTE — Progress Notes (Signed)
Patient Care Team: [name], FNP as PCP - General (General Practice)  DIAGNOSIS:    ICD-10-CM   1. Malignant neoplasm of lower-outer quadrant of right female breast, unspecified estrogen receptor status ([place])  C50.511   2. Malignant neoplasm of upper-inner quadrant of left breast in female, estrogen receptor positive ([place])  C50.212    Z17.0     SUMMARY OF ONCOLOGIC HISTORY: Oncology History  Breast cancer of upper-inner quadrant of left female breast ([place])  05/27/2007 Initial Diagnosis   Left breast biopsy: DCIS with calcifications ER/PR 100% positive, MRI revealed 1.7 x 1.4 x 0.9 cm DCIS at 12:00 position left breast   06/20/2007 Surgery   Left breast lumpectomy: T1 N0 M0 stage IA invasive ductal carcinoma 0.4-0.5 cm grade 1, ER 77%, PR 96%, Ki-67 less than 5%, HER-2 negative   08/21/2007 - 10/10/2007 Radiation Therapy   Adjuvant radiation therapy   01/14/2008 - 01/07/2017 Anti-estrogen oral therapy   Tamoxifen 20 mg daily   Breast cancer of lower-outer quadrant of right female breast ([place])  05/26/2010 Initial Diagnosis   DCIS ER/PR positive   06/16/2010 Surgery   No residual DCIS, extensive fibrocystic changes with focal atypical ductal hyperplasia   08/02/2010 - 09/22/2010 Radiation Therapy   Adjuvant radiation   10/26/2010 - 10/31/2015 Anti-estrogen oral therapy   Tamoxifen 20 mg daily     CHIEF COMPLIANT: Follow-up of bilateral DCIS  INTERVAL HISTORY: Ashley Lindsey is a 59 y.o. with above-mentioned history of bilateral DCIS who underwent a lumpectomy, radiation, and completed 5 years of tamoxifen. She presents to the clinic today because of extensive problems with the right breast and the right axillary lymphedema.  She has been swelling in the breast and the axilla for a while it waxes and wanes.  Some days is worse than the others.  Today she tells me that much better.  It had extended up to the nipple and therefore she came in today to be checked.  She wears bras to  support her breasts as well as the lymphedema sleeves.  Recently she is also gotten more uncomfortable and has been taking Tylenol for pain relief.  ALLERGIES:  is allergic to aspirin and penicillins.  MEDICATIONS:  Current Outpatient Medications  Medication Sig Dispense Refill  . acetaminophen (TYLENOL) 650 MG CR tablet Take 650 mg by mouth every 8 (eight) hours as needed for pain.    Marland Kitchen albuterol (VENTOLIN HFA) 108 (90 Base) MCG/ACT inhaler INHALE 1-2 PUFFS INTO THE LUNGS EVERY 6 (SIX) HOURS AS NEEDED FOR WHEEZING OR SHORTNESS OF BREATH. 18 g 1  . Bisacodyl (DULCOLAX PO) Take 1 capsule by mouth daily as needed (constipation).     . cholecalciferol 5000 units TABS Take 1 tablet (5,000 Units total) by mouth 2 (two) times daily. (Patient taking differently: Take 5,000 Units by mouth daily. )    . cyclobenzaprine (FLEXERIL) 10 MG tablet TAKE 1 TABLET BY MOUTH THREE TIMES A DAY AS NEEDED FOR MUSCLE SPASMS 30 tablet 1  . diphenhydrAMINE (BENADRYL) 25 mg capsule Take 25 mg by mouth 2 (two) times daily.    . fish oil-omega-3 fatty acids 1000 MG capsule Take 1 g by mouth daily.     . Flaxseed, Linseed, (FLAXSEED OIL MAX STR PO) Take 1 capsule by mouth daily.     . Galcanezumab-gnlm (EMGALITY) 120 MG/ML SOAJ Inject 1 each into the skin every 30 (thirty) days. 1 pen 6  . hydrochlorothiazide (HYDRODIURIL) 12.5 MG tablet TAKE 1 TABLET BY MOUTH  EVERY DAY 90 tablet 0  . Iron-FA-B Cmp-C-Biot-Probiotic (FUSION PLUS) CAPS Take 1 capsule by mouth daily. 30 capsule 3  . Melatonin 5 MG TABS Take 1 tablet by mouth.    . meloxicam (MOBIC) 7.5 MG tablet Take 1 tablet (7.5 mg total) by mouth daily. 90 tablet 1  . Multiple Vitamins-Minerals (MULTIVITAMIN & MINERAL PO) Take 1 tablet by mouth daily.    Marland Kitchen omeprazole (PRILOSEC) 20 MG capsule Take 20 mg by mouth daily.    . predniSONE (STERAPRED UNI-PAK 21 TAB) 10 MG (21) TBPK tablet Take as directed 21 tablet 0  . promethazine (PHENERGAN) 25 MG tablet Take 1 tablet (25 mg  total) by mouth every 6 (six) hours as needed for nausea or vomiting. 30 tablet 0  . propranolol (INDERAL) 20 MG tablet Take 1 tablet (20 mg total) by mouth 2 (two) times daily. 180 tablet 0  . senna (SENOKOT) 8.6 MG tablet Take 1 tablet by mouth daily as needed for constipation.     . SYMBICORT 80-4.5 MCG/ACT inhaler INHALE 2 PUFF BY INHALATION ROUTE EVERY DAY IN THE MORNING AND EVENING 30.6 Inhaler 1  . terbinafine (LAMISIL) 250 MG tablet Take 1 tablet (250 mg total) by mouth daily. 14 tablet 0  . triamcinolone cream (KENALOG) 0.1 % Apply 1 application topically 2 (two) times daily. Apply for up to one week. (Patient taking differently: Apply 1 application topically 2 (two) times daily as needed (rash). ) 30 g 0  . UNABLE TO FIND Rx: L8000-Post Surgical Bra (Quantity: 6) L2440- Silicone Breast Prosthesis (Quantity: 2) Dx: 174.9; Bilateral partial mastectomy Replacement due to hot flashes since previous items were too heavy and hot 1 each 0  . venlafaxine XR (EFFEXOR-XR) 150 MG 24 hr capsule TAKE 1 CAPSULE BY MOUTH SEE ADMIN INSTRUCTIONS. NIGHTLY WITH 75 MG CAPSULE, TOTAL OF 225 MG NIGHTLY 90 capsule 3  . venlafaxine XR (EFFEXOR-XR) 75 MG 24 hr capsule TAKE 1 CAPSULE DAILY ALONG WITH 150MG CAP TO MAKE IT 225MG TOTAL 90 capsule 0  . vitamin E 200 UNIT capsule Take 200 Units by mouth daily.     No current facility-administered medications for this visit.    PHYSICAL EXAMINATION: ECOG PERFORMANCE STATUS: 1 - Symptomatic but completely ambulatory  Vitals:   05/12/19 1046  BP: 107/73  Pulse: 84  Resp: 18  Temp: 98.2 F (36.8 C)  SpO2: 100%   Filed Weights   05/12/19 1046  Weight: 298 lb 3.2 oz (135.3 kg)    BREAST: Right breast and axillary lymphedema with swelling of the right arm (exam performed in the presence of a chaperone)  LABORATORY DATA:  I have reviewed the data as listed CMP Latest Ref Rng & Units 01/15/2019 06/06/2018 05/06/2018  Glucose 65 - 99 mg/dL 78 91 137(H)  BUN 6  - 24 mg/dL 10 9 10   Creatinine 0.57 - 1.00 mg/dL 0.88 0.66 0.77  Sodium 134 - 144 mmol/L 141 139 137  Potassium 3.5 - 5.2 mmol/L 4.3 5.1 4.1  Chloride 96 - 106 mmol/L 103 99 100  CO2 20 - 29 mmol/L 24 27 28   Calcium 8.7 - 10.2 mg/dL 9.2 9.1 8.7(L)  Total Protein 6.5 - 8.1 g/dL - - -  Total Bilirubin 0.3 - 1.2 mg/dL - - -  Alkaline Phos 38 - 126 U/L - - -  AST 15 - 41 U/L - - -  ALT 0 - 44 U/L - - -    Lab Results  Component Value Date  WBC 8.7 10/09/2018   HGB 11.5 10/09/2018   HCT 37.7 10/09/2018   MCV 79 10/09/2018   PLT 367 10/09/2018   NEUTROABS 5.4 03/03/2018    ASSESSMENT & PLAN:  Breast cancer of upper-inner quadrant of left female breast ([place]) Left breast invasive ductal carcinoma grade 1 ER 77% PR 96% gases on 5% HER-2 negative, T1 AN0M0 stage I A. status post lumpectomy 2.7 2009 followed by radiation therapy completed 10/10/2007, tamoxifen started September 2009 discontinued March 2012 followed by recurrence in the right breast as DCIS 05/26/2010 completed another lumpectomy and radiation therapy and she has been on tamoxifen ever since.  Insufficient tissue for BCI Completed Tamoxifen July 2017  Breast Cancer Surveillance: 1. Breast exam1/19/2021: Benign, fibromyalgia causing breast tenderness.   2. Mammogram8/29/19No abnormalities. Postsurgical changes. Breast Density Category C.  She needs new mammograms which she will be doing soon.  Encouraged her to lose weight with intermittent fasting.  Severe fibromyalgia related pains: Sent for gabapentin 300 TID, going to see neurology  Return to clinic in 1 year for follow-up    No orders of the defined types were placed in this encounter.  The patient has a good understanding of the overall plan. she agrees with it. she will call with any problems that may develop before the next visit here.  Total time spent: 30 mins including face to face time and time spent for planning, charting and coordination of  care  Nicholas Lose, MD 05/12/2019  I, Cloyde Reams Dorshimer, am acting as scribe for Dr. Nicholas Lose.  I have reviewed the above documentation for accuracy and completeness, and I agree with the above.

## 2019-05-12 ENCOUNTER — Encounter: Payer: Self-pay | Admitting: Nurse Practitioner

## 2019-05-12 ENCOUNTER — Other Ambulatory Visit: Payer: Self-pay

## 2019-05-12 ENCOUNTER — Inpatient Hospital Stay: Payer: Medicare Other | Attending: Hematology and Oncology | Admitting: Hematology and Oncology

## 2019-05-12 VITALS — BP 107/73 | HR 84 | Temp 98.2°F | Resp 18 | Ht 62.2 in | Wt 298.2 lb

## 2019-05-12 DIAGNOSIS — I890 Lymphedema, not elsewhere classified: Secondary | ICD-10-CM | POA: Diagnosis not present

## 2019-05-12 DIAGNOSIS — Z923 Personal history of irradiation: Secondary | ICD-10-CM | POA: Diagnosis not present

## 2019-05-12 DIAGNOSIS — Z79899 Other long term (current) drug therapy: Secondary | ICD-10-CM | POA: Diagnosis not present

## 2019-05-12 DIAGNOSIS — C50511 Malignant neoplasm of lower-outer quadrant of right female breast: Secondary | ICD-10-CM | POA: Insufficient documentation

## 2019-05-12 DIAGNOSIS — Z79810 Long term (current) use of selective estrogen receptor modulators (SERMs): Secondary | ICD-10-CM | POA: Insufficient documentation

## 2019-05-12 DIAGNOSIS — C50212 Malignant neoplasm of upper-inner quadrant of left female breast: Secondary | ICD-10-CM

## 2019-05-12 DIAGNOSIS — Z170 Estrogen receptor positive status [ER+]: Secondary | ICD-10-CM | POA: Diagnosis not present

## 2019-05-12 DIAGNOSIS — M797 Fibromyalgia: Secondary | ICD-10-CM | POA: Insufficient documentation

## 2019-05-12 MED ORDER — GABAPENTIN 300 MG PO CAPS: 300.0000 mg | ORAL_CAPSULE | Freq: Three times a day (TID) | ORAL | 3 refills | Status: DC

## 2019-05-12 NOTE — Progress Notes (Addendum)
[facility]    Provider:  Dr Jaynee Eagles Requesting Provider: [name], FNP Primary Care Provider:  [name], FNP  CC:  headaches  HPI:  Ashley Lindsey is a 59 y.o. female here as requested by [name], [place] for chronic headache. PMHx sleep apnea, muscle spams of neck, fibromyalgia, chronic headaches, severe obesity, depression, breast cancer. I reviewed [facility] notes, patient has chronic headaches, at most recent appointment she was tried on [employer], first dose was given in the office with samples.  Sent has visited the emergency room for migraines in the past, in May 2018 she was seen in the emergency room for a headache of 1 week, she stated exacerbation by light and sound, Decadron and Toradol was tried by acute care, Excedrin Motrin.  CT of the head was normal.  Physical and neurologic exam were normal.  Decadron, Compazine, Benadryl, fluids were given.  She had significant improvement in headache.  No red flags.  I notes any prior office notes from neurology although there is a telephone call from Dr. Suzie Portela in 2014 so I will check our prior records that are not in epic, will request a record search for anything prior to 2014, no other neurologic or headache encounters in care everywhere that I can see.   She went "cold [place]" on the excedrin. She has severe headaches. She has sleep apnea and it is untreated. She started Emgality 2 weeks ago. She has morning headaches, she is extremely fatigued, her grandmother and mother had migraines. She wakes up in the morning with headache, they are pounding, in the temples and down in her eyes, she is fatigued during the day, her memory is poor, she is excessively tired if she is sitting on the couch and comfortable she will nod off. She could go to her bed right She is morbidly obese and is breast cancer survivor. She has headaches 3x a week, severe, she wakes up with them then they are very severe, gets better with  getting and putting heat on her face, trying to move around, she talks in her sleep and her mother talks in her sleep. She also has right arm pain since the lymph nodes were taken out she has an ache to the top of her breast and that can trigger a headache. She also has obesity and we will send her to healthy weight and wellness, she is snoring severely.  Reviewed notes, labs and imaging from outside physicians, which showed:    CT head 09/15/2016 showed No acute intracranial abnormalities including mass lesion or mass effect, hydrocephalus, extra-axial fluid collection, midline shift, hemorrhage, or acute infarction, large ischemic events (personally reviewed images)     Review of Systems: Patient complains of symptoms per HPI as well as the following symptoms: arm pain. Pertinent negatives and positives per HPI. All others negative.   Social History   Socioeconomic History  . Marital status: Divorced    Spouse name: Not on file  . Number of children: Not on file  . Years of education: Not on file  . Highest education level: Not on file  Occupational History  . Occupation: disability  Tobacco Use  . Smoking status: Former Smoker    Packs/day: 0.25    Years: 1.00    Pack years: 0.25    Types: Cigarettes  . Smokeless tobacco: Never Used  Substance and Sexual Activity  . Alcohol use: Not Currently  . Drug use: No  . Sexual activity: Not Currently  Birth control/protection: Surgical  Other Topics Concern  . Not on file  Social History Narrative   Right handed    Coffee daily, sometimes Ginger ale    Social Determinants of Health   Financial Resource Strain:   . Difficulty of Paying Living Expenses: Not on file  Food Insecurity: No Food Insecurity  . Worried About Charity fundraiser in the Last Year: Never true  . Ran Out of Food in the Last Year: Never true  Transportation Needs: No Transportation Needs  . Lack of Transportation (Medical): No  . Lack of  Transportation (Non-Medical): No  Physical Activity: Sufficiently Active  . Days of Exercise per Week: 7 days  . Minutes of Exercise per Session: 30 min  Stress: Stress Concern Present  . Feeling of Stress : Very much  Social Connections:   . Frequency of Communication with Friends and Family: Not on file  . Frequency of Social Gatherings with Friends and Family: Not on file  . Attends Religious Services: Not on file  . Active Member of Clubs or Organizations: Not on file  . Attends Archivist Meetings: Not on file  . Marital Status: Not on file  Intimate Partner Violence: Not At Risk  . Fear of Current or Ex-Partner: No  . Emotionally Abused: No  . Physically Abused: No  . Sexually Abused: No    Family History  Problem Relation Age of Onset  . Breast cancer Mother 19  . Heart disease Father   . Prostate cancer Maternal Uncle 78  . Cancer Maternal Aunt 63       d. from "female cancer" possibly cervical  . Breast cancer Maternal Aunt 60  . Prostate cancer Paternal Uncle   . Prostate cancer Maternal Grandfather 71  . Prostate cancer Maternal Uncle 80  . Prostate cancer Paternal Uncle   . Breast cancer Cousin 77       mat 1st cousin    Past Medical History:  Diagnosis Date  . Abdominal cramps   . Arthritis   . Cancer [facility])    s/p radiation- last dose 6/12//has been off Tamoxifen 8/12  . Chronic headaches   . Costochondritis   . Fatigue   . Fibromyalgia 03/06/2013  . Lymphedema    RIGHT ARM-////  STATES USE LEFT ARM FOR BP'S  . Migraines   . Muscle spasms of head and/or neck    following to the breast  . Passed out    4th of july  . Personal history of radiation therapy 2010   lt breast   . S/P radiation therapy 08/19/07 - 10/10/07   Left Breast/5040 cGy/28 fractions with Boost for a toatl dose of 6300 dGy  . S/P radiation therapy 08/14/10 -10/03/10   right breast  . Sleep apnea    STOP BANG SCORE 5    Patient Active Problem List   Diagnosis Date  Noted  . Chronic migraine without aura without status migrainosus, not intractable 05/13/2019  . Class 3 severe obesity due to excess calories without serious comorbidity with body mass index (BMI) of 40.0 to 44.9 in adult ([place]) 08/05/2018  . Depression 08/05/2018  . Chronic nonintractable headache 08/05/2018  . Elevated blood-pressure reading without diagnosis of hypertension 06/06/2018  . Chronic pain of right ankle 04/24/2018  . Breast cancer of lower-outer quadrant of right female breast ([place]) 12/02/2014  . Bronchospasm 09/21/2014  . Dyspnea 09/21/2014  . Chronic pain of right knee 09/21/2014  . Fibromyalgia 03/06/2013  .  Dense breasts 03/06/2013  . Mastalgia 03/06/2013  . Back pain, lumbosacral 03/06/2013  . Lymphedema of arm 03/06/2013  . Atypical chest pain 01/06/2013  . Breast cancer of upper-inner quadrant of left female breast ([place]) 09/08/2012  . Family history of malignant neoplasm of breast 09/08/2012  . S/P radiation therapy   . History of breast cancer in female 08/13/2011    Past Surgical History:  Procedure Laterality Date  . ABDOMINAL HYSTERECTOMY     with left salpingooophorectomy  . BREAST LUMPECTOMY  2009/2012   LEFT/RIGHT with lymph node dissection  . KNEE ARTHROSCOPY     right  . OOPHORECTOMY  2013    Current Outpatient Medications  Medication Sig Dispense Refill  . acetaminophen (TYLENOL) 650 MG CR tablet Take 650 mg by mouth every 8 (eight) hours as needed for pain.    Marland Kitchen albuterol (VENTOLIN HFA) 108 (90 Base) MCG/ACT inhaler INHALE 1-2 PUFFS INTO THE LUNGS EVERY 6 (SIX) HOURS AS NEEDED FOR WHEEZING OR SHORTNESS OF BREATH. 18 g 1  . Bisacodyl (DULCOLAX PO) Take 1 capsule by mouth daily as needed (constipation).     . cholecalciferol 5000 units TABS Take 1 tablet (5,000 Units total) by mouth 2 (two) times daily. (Patient taking differently: Take 5,000 Units by mouth daily. )    . cyclobenzaprine (FLEXERIL) 10 MG tablet TAKE 1 TABLET BY MOUTH THREE TIMES A  DAY AS NEEDED FOR MUSCLE SPASMS 30 tablet 1  . diphenhydrAMINE (BENADRYL) 25 mg capsule Take 25 mg by mouth 2 (two) times daily.    . fish oil-omega-3 fatty acids 1000 MG capsule Take 1 g by mouth daily.     . Flaxseed, Linseed, (FLAXSEED OIL MAX STR PO) Take 1 capsule by mouth daily.     . hydrochlorothiazide (HYDRODIURIL) 12.5 MG tablet TAKE 1 TABLET BY MOUTH EVERY DAY 90 tablet 0  . Iron-FA-B Cmp-C-Biot-Probiotic (FUSION PLUS) CAPS Take 1 capsule by mouth daily. 30 capsule 3  . meloxicam (MOBIC) 7.5 MG tablet Take 1 tablet (7.5 mg total) by mouth daily. 90 tablet 1  . Multiple Vitamins-Minerals (MULTIVITAMIN & MINERAL PO) Take 1 tablet by mouth daily.    Marland Kitchen omeprazole (PRILOSEC) 20 MG capsule Take 20 mg by mouth daily.    . promethazine (PHENERGAN) 25 MG tablet Take 1 tablet (25 mg total) by mouth every 6 (six) hours as needed for nausea or vomiting. 30 tablet 0  . propranolol (INDERAL) 20 MG tablet Take 1 tablet (20 mg total) by mouth 2 (two) times daily. 180 tablet 0  . senna (SENOKOT) 8.6 MG tablet Take 1 tablet by mouth daily as needed for constipation.     . SYMBICORT 80-4.5 MCG/ACT inhaler INHALE 2 PUFF BY INHALATION ROUTE EVERY DAY IN THE MORNING AND EVENING 30.6 Inhaler 1  . terbinafine (LAMISIL) 250 MG tablet Take 1 tablet (250 mg total) by mouth daily. 14 tablet 0  . triamcinolone cream (KENALOG) 0.1 % Apply 1 application topically 2 (two) times daily. Apply for up to one week. (Patient taking differently: Apply 1 application topically 2 (two) times daily as needed (rash). ) 30 g 0  . UNABLE TO FIND Rx: L8000-Post Surgical Bra (Quantity: 6) [id number]- Silicone Breast Prosthesis (Quantity: 2) Dx: 174.9; Bilateral partial mastectomy Replacement due to hot flashes since previous items were too heavy and hot 1 each 0  . venlafaxine XR (EFFEXOR-XR) 150 MG 24 hr capsule TAKE 1 CAPSULE BY MOUTH SEE ADMIN INSTRUCTIONS. NIGHTLY WITH 75 MG CAPSULE, TOTAL OF 225 MG  NIGHTLY 90 capsule 3  .  venlafaxine XR (EFFEXOR-XR) 75 MG 24 hr capsule TAKE 1 CAPSULE DAILY ALONG WITH 150MG  CAP TO MAKE IT 225MG  TOTAL 90 capsule 0  . vitamin E 200 UNIT capsule Take 200 Units by mouth daily.    . Fremanezumab-vfrm (AJOVY) 225 MG/1.5ML SOAJ Inject 225 mg into the skin every 30 (thirty) days. 3 pen 11   No current facility-administered medications for this visit.    Allergies as of 05/13/2019 - Review Complete 05/13/2019  Allergen Reaction Noted  . Aspirin  01/06/2013  . Penicillins Hives and Swelling 09/12/2010    Vitals: BP 120/72 (BP Location: Right Arm, Patient Position: Sitting, Cuff Size: Normal)   Pulse 82   Temp (!) 97.3 F (36.3 C)   Ht 5\' 2"  (1.575 m)   Wt 295 lb 9.6 oz (134.1 kg)   BMI 54.07 kg/m  Last Weight:  Wt Readings from Last 1 Encounters:  05/13/19 295 lb 9.6 oz (134.1 kg)   Last Height:   Ht Readings from Last 1 Encounters:  05/13/19 5\' 2"  (1.575 m)     Physical exam: Exam: Gen: NAD, conversant, well nourised, obese, well groomed                     CV: RRR, no MRG. No Carotid Bruits. No peripheral edema, warm, nontender Eyes: Conjunctivae clear without exudates or hemorrhage  Neuro: Detailed Neurologic Exam  Speech:    Speech is normal; fluent and spontaneous with normal comprehension.  Cognition:    The patient is oriented to person, place, and time;     recent and remote memory intact;     language fluent;     normal attention, concentration,     fund of knowledge Cranial Nerves:    The pupils are equal, round, and reactive to light.attempted fundoscopy could not visualize due to small pupils. Visual fields are full to finger confrontation. Extraocular movements are intact. Trigeminal sensation is intact and the muscles of mastication are normal. The face is symmetric. The palate elevates in the midline. Hearing intact. Voice is normal. Shoulder shrug is normal. The tongue has normal motion without fasciculations.   Coordination:    Normal finger  to nose   Gait:    Wide based due to body habitus. Antalgic (knee pain)  Motor Observation:    No asymmetry, no atrophy, and no involuntary movements noted. Tone:    Normal muscle tone.    Posture:    Posture is normal. normal erect    Strength:    Strength is V/V in the upper and lower limbs.      Sensation: intact to LT     Reflex Exam:  DTR's:    Deep tendon reflexes in the upper and lower extremities are normal bilaterally.   Toes:    The toes are downgoing bilaterally.   Clonus:    Clonus is absent.    Assessment/Plan:   59 y.o. female here as requested by [name], FNP for chronic headache. PMHx sleep apnea, muscle spams of neck, fibromyalgia, chronic headaches, severe obesity, depression, breast cancer. Really lovely patient, I think she not only has migraines but she may also have untreated sleep apnea given that she is excessively tired, she wakes up with severe headaches, her memory feels impaired, she wants to sleep all day long. I think while we sort this out in the meantime I will give her 3 Ajovy samples, she was started on [employer] but insurance  may not pay for it but I have Ajovy samples for the next 3 months and in the meantime we will try to get her sleep evaluation she should also have an MRI of the brain with and without contrast due to her past medical history of breast cancer and positional worsening headaches. For her morbid obesity I think she needs the healthy weight and wellness center.  -We will refer for sleep evaluation Dr. Brett Fairy, had sleep apne ain the past, last sleep test 2013 (provided prior sleep tests to team),  -Insurance will not pay for Emgality but I can provide her 3 Ajovy samples until we hopefully get the sleep study and see if sleep apnea or hypoventilation syndrome may have anything to do with her severe morning headaches -MRI of the brain with and without contrast as above given concerning symptoms. ESS 19 FSS 90 (given to  team) -Healthy weight and wellness center for morbid obesity  Discussed: To prevent or relieve headaches, try the following: Cool Compress. Lie down and place a cool compress on your head.  Avoid headache triggers. If certain foods or odors seem to have triggered your migraines in the past, avoid them. A headache diary might help you identify triggers.  Include physical activity in your daily routine. Try a daily walk or other moderate aerobic exercise.  Manage stress. Find healthy ways to cope with the stressors, such as delegating tasks on your to-do list.  Practice relaxation techniques. Try deep breathing, yoga, massage and visualization.  Eat regularly. Eating regularly scheduled meals and maintaining a healthy diet might help prevent headaches. Also, drink plenty of fluids.  Follow a regular sleep schedule. Sleep deprivation might contribute to headaches Consider biofeedback. With this mind-body technique, you learn to control certain bodily functions -- such as muscle tension, heart rate and blood pressure -- to prevent headaches or reduce headache pain.    Proceed to emergency room if you experience new or worsening symptoms or symptoms do not resolve, if you have new neurologic symptoms or if headache is severe, or for any concerning symptom.   Provided education and documentation from American headache Society toolbox including articles on: chronic migraine medication overuse headache, chronic migraines, prevention of migraines, behavioral and other nonpharmacologic treatments for headache.   Orders Placed This Encounter  Procedures  . MR BRAIN W WO CONTRAST  . Comprehensive metabolic panel  . CBC  . Ambulatory referral to Sleep Studies  . Ambulatory referral to [place] ordered this encounter  Medications  . Fremanezumab-vfrm (AJOVY) 225 MG/1.5ML SOAJ    Sig: Inject 225 mg into the skin every 30 (thirty) days.    Dispense:  3 pen    Refill:  11    Cc:  [name], [place],  [name], FNP  Sarina Ill, [facility] [address] [place], [place] [address]  Phone [phone] Fax [phone]

## 2019-05-12 NOTE — Assessment & Plan Note (Signed)
Left breast invasive ductal carcinoma grade 1 ER 77% PR 96% gases on 5% HER-2 negative, T1 AN0M0 stage I A. status post lumpectomy 2.7 2009 followed by radiation therapy completed 10/10/2007, tamoxifen started September 2009 discontinued March 2012 followed by recurrence in the right breast as DCIS 05/26/2010 completed another lumpectomy and radiation therapy and she has been on tamoxifen ever since.  Insufficient tissue for BCI Completed Tamoxifen July 2017  Breast Cancer Surveillance: 1. Breast exam1/19/2021: Benign, fibromyalgia causing breast tenderness.   2. Mammogram8/29/19No abnormalities. Postsurgical changes. Breast Density Category C.  She needs new mammograms which she will be done soon.  Severe fibromyalgia related pains:  Return to clinic in 1 year for follow-up

## 2019-05-13 ENCOUNTER — Encounter: Payer: Self-pay | Admitting: Neurology

## 2019-05-13 ENCOUNTER — Ambulatory Visit (INDEPENDENT_AMBULATORY_CARE_PROVIDER_SITE_OTHER): Payer: Medicare Other | Admitting: Neurology

## 2019-05-13 VITALS — BP 120/72 | HR 82 | Temp 97.3°F | Ht 62.0 in | Wt 295.6 lb

## 2019-05-13 DIAGNOSIS — G43709 Chronic migraine without aura, not intractable, without status migrainosus: Secondary | ICD-10-CM | POA: Diagnosis not present

## 2019-05-13 DIAGNOSIS — R51 Headache with orthostatic component, not elsewhere classified: Secondary | ICD-10-CM | POA: Diagnosis not present

## 2019-05-13 DIAGNOSIS — R519 Headache, unspecified: Secondary | ICD-10-CM | POA: Diagnosis not present

## 2019-05-13 DIAGNOSIS — G8929 Other chronic pain: Secondary | ICD-10-CM

## 2019-05-13 DIAGNOSIS — G4719 Other hypersomnia: Secondary | ICD-10-CM | POA: Diagnosis not present

## 2019-05-13 DIAGNOSIS — Z853 Personal history of malignant neoplasm of breast: Secondary | ICD-10-CM | POA: Diagnosis not present

## 2019-05-13 MED ORDER — AJOVY 225 MG/1.5ML ~~LOC~~ SOAJ: 225.0000 mg | SUBCUTANEOUS | 11 refills | Status: DC

## 2019-05-13 NOTE — Patient Instructions (Addendum)
-We will refer for sleep evaluation Dr. Brett Fairy -Insurance will not pay for Emgality but I can provide her 3 Ajovy samples until we hopefully get the sleep study and see if sleep apnea or hypoventilation syndrome may have anything to do with her severe morning headaches  -MRI of the brain with and without contrast as above given concerning symptoms -[facility]  Fremanezumab injection What is this medicine? FREMANEZUMAB (fre ma NEZ ue mab) is used to prevent migraine headaches. This medicine may be used for other purposes; ask your health care provider or pharmacist if you have questions. COMMON BRAND NAME(S): AJOVY What should I tell my health care provider before I take this medicine? They need to know if you have any of these conditions:  an unusual or allergic reaction to fremanezumab, other medicines, foods, dyes, or preservatives  pregnant or trying to get pregnant  breast-feeding How should I use this medicine? This medicine is for injection under the skin. You will be taught how to prepare and give this medicine. Use exactly as directed. Take your medicine at regular intervals. Do not take your medicine more often than directed. It is important that you put your used needles and syringes in a special sharps container. Do not put them in a trash can. If you do not have a sharps container, call your pharmacist or healthcare provider to get one. Talk to your pediatrician regarding the use of this medicine in children. Special care may be needed. Overdosage: If you think you have taken too much of this medicine contact a poison control center or emergency room at once. NOTE: This medicine is only for you. Do not share this medicine with others. What if I miss a dose? If you miss a dose, take it as soon as you can. If it is almost time for your next dose, take only that dose. Do not take double or extra doses. What may interact with this  medicine? Interactions are not expected. This list may not describe all possible interactions. Give your health care provider a list of all the medicines, herbs, non-prescription drugs, or dietary supplements you use. Also tell them if you smoke, drink alcohol, or use illegal drugs. Some items may interact with your medicine. What should I watch for while using this medicine? Tell your doctor or healthcare professional if your symptoms do not start to get better or if they get worse. What side effects may I notice from receiving this medicine? Side effects that you should report to your doctor or health care professional as soon as possible:  allergic reactions like skin rash, itching or hives, swelling of the face, lips, or tongue Side effects that usually do not require medical attention (report these to your doctor or health care professional if they continue or are bothersome):  pain, redness, or irritation at site where injected This list may not describe all possible side effects. Call your doctor for medical advice about side effects. You may report side effects to FDA at 1-800-FDA-1088. Where should I keep my medicine? Keep out of the reach of children. You will be instructed on how to store this medicine. Throw away any unused medicine after the expiration date on the label. NOTE: This sheet is a summary. It may not cover all possible information. If you have questions about this medicine, talk to your doctor, pharmacist, or health care provider.  2020 Elsevier/Gold Standard (2017-01-07 17:22:56)  Sleep Apnea Sleep apnea is a condition  in which breathing pauses or becomes shallow during sleep. Episodes of sleep apnea usually last 10 seconds or longer, and they may occur as many as 20 times an hour. Sleep apnea disrupts your sleep and keeps your body from getting the rest that it needs. This condition can increase your risk of certain health problems, including:  Heart  attack.  Stroke.  Obesity.  Diabetes.  Heart failure.  Irregular heartbeat. What are the causes? There are three kinds of sleep apnea:  Obstructive sleep apnea. This kind is caused by a blocked or collapsed airway.  Central sleep apnea. This kind happens when the part of the brain that controls breathing does not send the correct signals to the muscles that control breathing.  Mixed sleep apnea. This is a combination of obstructive and central sleep apnea. The most common cause of this condition is a collapsed or blocked airway. An airway can collapse or become blocked if:  Your throat muscles are abnormally relaxed.  Your tongue and tonsils are larger than normal.  You are overweight.  Your airway is smaller than normal. What increases the risk? You are more likely to develop this condition if you:  Are overweight.  Smoke.  Have a smaller than normal airway.  Are elderly.  Are female.  Drink alcohol.  Take sedatives or tranquilizers.  Have a family history of sleep apnea. What are the signs or symptoms? Symptoms of this condition include:  Trouble staying asleep.  Daytime sleepiness and tiredness.  Irritability.  Loud snoring.  Morning headaches.  Trouble concentrating.  Forgetfulness.  Decreased interest in sex.  Unexplained sleepiness.  Mood swings.  Personality changes.  Feelings of depression.  Waking up often during the night to urinate.  Dry mouth.  Sore throat. How is this diagnosed? This condition may be diagnosed with:  A medical history.  A physical exam.  A series of tests that are done while you are sleeping (sleep study). These tests are usually done in a sleep lab, but they may also be done at home. How is this treated? Treatment for this condition aims to restore normal breathing and to ease symptoms during sleep. It may involve managing health issues that can affect breathing, such as high blood pressure or obesity.  Treatment may include:  Sleeping on your side.  Using a decongestant if you have nasal congestion.  Avoiding the use of depressants, including alcohol, sedatives, and narcotics.  Losing weight if you are overweight.  Making changes to your diet.  Quitting smoking.  Using a device to open your airway while you sleep, such as: ? An oral appliance. This is a custom-made mouthpiece that shifts your lower jaw forward. ? A continuous positive airway pressure (CPAP) device. This device blows air through a mask when you breathe out (exhale). ? A nasal expiratory positive airway pressure (EPAP) device. This device has valves that you put into each nostril. ? A bi-level positive airway pressure (BPAP) device. This device blows air through a mask when you breathe in (inhale) and breathe out (exhale).  Having surgery if other treatments do not work. During surgery, excess tissue is removed to create a wider airway. It is important to get treatment for sleep apnea. Without treatment, this condition can lead to:  High blood pressure.  Coronary artery disease.  In men, an inability to achieve or maintain an erection (impotence).  Reduced thinking abilities. Follow these instructions at home: Lifestyle  Make any lifestyle changes that your health care provider recommends.  Eat a healthy, well-balanced diet.  Take steps to lose weight if you are overweight.  Avoid using depressants, including alcohol, sedatives, and narcotics.  Do not use any products that contain nicotine or tobacco, such as cigarettes, e-cigarettes, and chewing tobacco. If you need help quitting, ask your health care provider. General instructions  Take over-the-counter and prescription medicines only as told by your health care provider.  If you were given a device to open your airway while you sleep, use it only as told by your health care provider.  If you are having surgery, make sure to tell your health care  provider you have sleep apnea. You may need to bring your device with you.  Keep all follow-up visits as told by your health care provider. This is important. Contact a health care provider if:  The device that you received to open your airway during sleep is uncomfortable or does not seem to be working.  Your symptoms do not improve.  Your symptoms get worse. Get help right away if:  You develop: ? Chest pain. ? Shortness of breath. ? Discomfort in your back, arms, or stomach.  You have: ? Trouble speaking. ? Weakness on one side of your body. ? Drooping in your face. These symptoms may represent a serious problem that is an emergency. Do not wait to see if the symptoms will go away. Get medical help right away. Call your local emergency services (911 in the [place] Do not drive yourself to the hospital. Summary  Sleep apnea is a condition in which breathing pauses or becomes shallow during sleep.  The most common cause is a collapsed or blocked airway.  The goal of treatment is to restore normal breathing and to ease symptoms during sleep. This information is not intended to replace advice given to you by your health care provider. Make sure you discuss any questions you have with your health care provider. Document Revised: 09/24/2018 Document Reviewed: 12/03/2017 Elsevier Patient Education  [facility].

## 2019-05-14 ENCOUNTER — Other Ambulatory Visit: Payer: Self-pay

## 2019-05-14 ENCOUNTER — Encounter: Payer: Self-pay | Admitting: Nurse Practitioner

## 2019-05-14 DIAGNOSIS — G8929 Other chronic pain: Secondary | ICD-10-CM

## 2019-05-14 DIAGNOSIS — F329 Major depressive disorder, single episode, unspecified: Secondary | ICD-10-CM

## 2019-05-14 DIAGNOSIS — F32A Depression, unspecified: Secondary | ICD-10-CM

## 2019-05-14 DIAGNOSIS — R519 Headache, unspecified: Secondary | ICD-10-CM

## 2019-05-14 DIAGNOSIS — Z6841 Body Mass Index (BMI) 40.0 and over, adult: Secondary | ICD-10-CM

## 2019-05-14 LAB — COMPREHENSIVE METABOLIC PANEL
ALT: 18 IU/L (ref 0–32)
AST: 21 IU/L (ref 0–40)
Albumin/Globulin Ratio: 1.4 (ref 1.2–2.2)
Albumin: 4 g/dL (ref 3.8–4.9)
Alkaline Phosphatase: 77 IU/L (ref 39–117)
BUN/Creatinine Ratio: 13 (ref 12–28)
BUN: 8 mg/dL (ref 8–27)
Bilirubin Total: <0.2 mg/dL (ref 0.0–1.2)
CO2: 24 mmol/L (ref 20–29)
Calcium: 9 mg/dL (ref 8.7–10.3)
Chloride: 102 mmol/L (ref 96–106)
Creatinine, Ser: 0.62 mg/dL (ref 0.57–1.00)
GFR calc Af Amer: 113 mL/min/{1.73_m2} (ref >59)
GFR calc non Af Amer: 98 mL/min/{1.73_m2} (ref >59)
Globulin, Total: 2.8 g/dL (ref 1.5–4.5)
Glucose: 120 mg/dL — ABNORMAL HIGH (ref 65–99)
Potassium: 4.5 mmol/L (ref 3.5–5.2)
Sodium: 143 mmol/L (ref 134–144)
Total Protein: 6.8 g/dL (ref 6.0–8.5)

## 2019-05-14 LAB — CBC
Hematocrit: 37.7 % (ref 34.0–46.6)
Hemoglobin: 11.7 g/dL (ref 11.1–15.9)
MCH: 25.7 pg — ABNORMAL LOW (ref 26.6–33.0)
MCHC: 31 g/dL — ABNORMAL LOW (ref 31.5–35.7)
MCV: 83 fL (ref 79–97)
Platelets: 360 10*3/uL (ref 150–450)
RBC: 4.55 x10E6/uL (ref 3.77–5.28)
RDW: 14.2 % (ref 11.7–15.4)
WBC: 8.7 10*3/uL (ref 3.4–10.8)

## 2019-05-14 MED ORDER — PROPRANOLOL HCL 20 MG PO TABS: 20.0000 mg | ORAL_TABLET | Freq: Two times a day (BID) | ORAL | 0 refills | Status: DC

## 2019-05-14 NOTE — Progress Notes (Signed)
Labs show nothing concerning, have a great weekend! Thanks Dr. Loni Muse

## 2019-05-15 DIAGNOSIS — L300 Nummular dermatitis: Secondary | ICD-10-CM | POA: Diagnosis not present

## 2019-05-15 DIAGNOSIS — L810 Postinflammatory hyperpigmentation: Secondary | ICD-10-CM | POA: Diagnosis not present

## 2019-05-24 ENCOUNTER — Other Ambulatory Visit: Payer: Self-pay | Admitting: Nurse Practitioner

## 2019-05-24 DIAGNOSIS — M797 Fibromyalgia: Secondary | ICD-10-CM

## 2019-05-25 ENCOUNTER — Other Ambulatory Visit: Payer: Self-pay

## 2019-05-25 ENCOUNTER — Encounter: Payer: Self-pay | Admitting: Neurology

## 2019-05-25 ENCOUNTER — Ambulatory Visit (INDEPENDENT_AMBULATORY_CARE_PROVIDER_SITE_OTHER): Payer: Medicare Other | Admitting: Neurology

## 2019-05-25 VITALS — BP 136/92 | HR 92 | Temp 96.4°F | Ht 62.0 in | Wt 294.0 lb

## 2019-05-25 DIAGNOSIS — R519 Headache, unspecified: Secondary | ICD-10-CM

## 2019-05-25 DIAGNOSIS — R0601 Orthopnea: Secondary | ICD-10-CM

## 2019-05-25 DIAGNOSIS — K219 Gastro-esophageal reflux disease without esophagitis: Secondary | ICD-10-CM

## 2019-05-25 DIAGNOSIS — R0683 Snoring: Secondary | ICD-10-CM

## 2019-05-25 DIAGNOSIS — G8929 Other chronic pain: Secondary | ICD-10-CM

## 2019-05-25 DIAGNOSIS — E669 Obesity, unspecified: Secondary | ICD-10-CM

## 2019-05-25 DIAGNOSIS — G43709 Chronic migraine without aura, not intractable, without status migrainosus: Secondary | ICD-10-CM | POA: Diagnosis not present

## 2019-05-25 DIAGNOSIS — R351 Nocturia: Secondary | ICD-10-CM

## 2019-05-25 DIAGNOSIS — R079 Chest pain, unspecified: Secondary | ICD-10-CM

## 2019-05-25 NOTE — Progress Notes (Signed)
SLEEP MEDICINE CLINIC    Provider:  Larey Seat, MD  Primary Care Physician:  [name], [place] [id number]     Referring Provider: Dr Jaynee Eagles, MD          Chief Complaint according to patient   Patient presents with:    . New Patient (Initial Visit)     she is waking up with headaches. Has seen headache and wellness, and had a slep study at [facility] in 2013, negative for OSA.       HISTORY OF PRESENT ILLNESS:  Ashley Lindsey is a 59 y.o. year old African American female patient seen here as a referral on 05/25/2019 referred by Dr. Sarina Ill, MD.Chief concern according to patient :   [name] has suffered from sleep related headaches, often woke up with headaches, the headaches have been chronic intractable.  She reports to them as migraines also Dr. Jaynee Eagles seem to qualify them as unspecified headache type.  There is a possibility that they are related to medication overuse.  There is also possibility that they are related to at this time undetected could-untreated sleep apnea.  Previous sleep test 2013 did not reveal sleep apnea, was performed at [facility].  The patient was diagnosed with breast cancer in the year 2013 and again 2015 first left and right breast affected, this interval history is very important.   I have the pleasure of seeing Ashley Lindsey today, a right-handed [place] or [place] American female with a possible sleep disorder.  She has a  has a past medical history of Abdominal cramps, Arthritis, Breast Cancer ([place]), Chronic headaches, Costochondritis, Fatigue,Lymphedema, Migraines, Muscle spasms of head and/or neck, Passed out, Personal history of radiation therapy (2010), S/P radiation therapy (08/19/07 - 10/10/07), S/P radiation therapy (08/14/10 -10/03/10), and rebound migraine headaches.    The patient's previous sleep study is available here in copy.  She underwent a baseline polysomnography on 22 November 2011 and  she was diagnosed with obstructive sleep apnea.  So on the contrary she actually had an AHI of 16.1 and RDI of 18.7/h significant for mild sleep apnea there were a lot of spontaneous arousals at night.  Two thirds of her events were hypopneas and only one third in apneas all of them were obstructive in nature the longest hypopnea duration was 53.8 seconds.  The apnea was strictly bound to REM sleep.  REM dependent apnea would usually require CPAP titration for which she returned to the [place] left on 10 June 2012 still has a significant number of spontaneous arousals 47 for the night but her AHI has been reduced to 0.0 she was tachypneic her overnight respiratory rate was 79 minutes she was titrated to only 5 cm water pressure under which her sleep efficiency was very much reduced and then to 6 cm water pressure where she also reached a low sleep efficiency but this time 77%.  Total sleep time was 230 minutes of which over 70 minutes were in rem sleep.  She did not have oxygen desaturation and she did not have apneas or hypopneas.  Dr. Baird Lyons was the treating physician.  So she was ordered to use a small ResMed medium Mirage fullface mask and optimal CPAP pressure was determined to be 6 cm water.  Room air. She was "never given a CPAP ".   In the meantime the patient continues to wake up fatigued she has  loud and continuous snoring, she has a lot of body aching and pain sometimes shortness of breath trouble concentrating hypersomnia and daytime indigestion and acid reflux, continued weight gain, continued headaches especially in the morning she wakes up with headaches she also reports night sleep sweats sleeps better when she is slightly propped up, she takes almost daily naps and she has a dry mouth.  In addition chest pain when exercising, leg pain when exercising and she has swollen ankles and lower extremities.    Family medical /sleep history: NO  other family member on CPAP  with OSA, insomnia, sleep walkers.    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 Family status is single , with 1 adult daughter. Pets : a [place] terrier is present. Tobacco use; quit 7 years ago 2014.  ETOH use : quit 7 years ago, 2014 ,  Caffeine intake in form of Coffee( 1 cup daily) Soda( none ) Tea (none) and neither  energy drinks. Regular exercise: none. Knee pain , had injured her knee in a fall.    Sleep habits are as follows:  The patient's dinner time is between 8 PM. The patient goes to bed at 10-11 PM and falls asleep easily, continues to sleep for 1-2 hours, wakes for 5-6  bathroom breaks, the first time at 12 AM.   The preferred sleep position is laterally, with the support of 3-4  Pillows, helping Orthopnea, GERD- Dreams are reportedly frequent/vivid. She dreams more since gabapentin. Marland Kitchen  8 AM is the usual rise time.  The patient wakes up spontaneously 7-8 . She reports not feeling refreshed or restored in AM, with symptoms such as dry mouth, morning headaches, and residual fatigue.  Naps are taken frequently, lasting from 1-2 hours and are more refreshing than nocturnal sleep.    Review of Systems: Out of a complete 14 system review, the patient complains of only the following symptoms, and all other reviewed systems are negative.:  Fatigue, sleepiness , snoring, fragmented sleep, NOCTURIA 4-6 times !!! Orthopnea and GERD related sleep interruptions. Dr mouth, dry eyes.  Gait instability.  Super obesity.    How likely are you to doze in the following situations: 0 = not likely, 1 = slight chance, 2 = moderate chance, 3 = high chance   Sitting and Reading? Watching Television? Sitting inactive in a public place (theater or meeting)? As a passenger in a car for an hour without a break? Lying down in the afternoon when circumstances permit? Sitting and talking to someone? Sitting  quietly after lunch without alcohol? In a car, while stopped for a few minutes in traffic?   Total = 16 / 24 points - hypersomnia  FSS endorsed at 41/ 63 points. - high fatigue in pain, chronic.  GDS 5/ 15 points depressed.   Social History   Socioeconomic History  . Marital status: Divorced    Spouse name: Not on file  . Number of children: Not on file  . Years of education: Not on file  . Highest education level: Not on file  Occupational History  . Occupation: disability  Tobacco Use  . Smoking status: Former Smoker    Packs/day: 0.25    Years: 1.00    Pack years: 0.25    Types: Cigarettes  . Smokeless tobacco: Never Used  Substance and Sexual Activity  . Alcohol use: Not Currently  . Drug use:  No  . Sexual activity: Not Currently    Birth control/protection: Surgical  Other Topics Concern  . Not on file  Social History Narrative   Right handed    Coffee daily, sometimes Ginger ale    Social Determinants of Health   Financial Resource Strain:   . Difficulty of Paying Living Expenses: Not on file  Food Insecurity: No Food Insecurity  . Worried About Charity fundraiser in the Last Year: Never true  . Ran Out of Food in the Last Year: Never true  Transportation Needs: No Transportation Needs  . Lack of Transportation (Medical): No  . Lack of Transportation (Non-Medical): No  Physical Activity: Sufficiently Active  . Days of Exercise per Week: 7 days  . Minutes of Exercise per Session: 30 min  Stress: Stress Concern Present  . Feeling of Stress : Very much  Social Connections:   . Frequency of Communication with Friends and Family: Not on file  . Frequency of Social Gatherings with Friends and Family: Not on file  . Attends Religious Services: Not on file  . Active Member of Clubs or Organizations: Not on file  . Attends Archivist Meetings: Not on file  . Marital Status: Not on file    Family History  Problem Relation Age of Onset  . Breast  cancer Mother 40  . Heart disease Father   . Prostate cancer Maternal Uncle 78  . Cancer Maternal Aunt 63       d. from "female cancer" possibly cervical  . Breast cancer Maternal Aunt 60  . Prostate cancer Paternal Uncle   . Prostate cancer Maternal Grandfather 59  . Prostate cancer Maternal Uncle 80  . Prostate cancer Paternal Uncle   . Breast cancer Cousin 74       mat 1st cousin    Past Medical History:  Diagnosis Date  . Abdominal cramps   . Arthritis   . Cancer [facility])    s/p radiation- last dose 6/12//has been off Tamoxifen 8/12  . Chronic headaches   . Costochondritis   . Fatigue   . Fibromyalgia 03/06/2013  . Lymphedema    RIGHT ARM-////  STATES USE LEFT ARM FOR BP'S  . Migraines   . Muscle spasms of head and/or neck    following to the breast  . Passed out    4th of july  . Personal history of radiation therapy 2010   lt breast   . S/P radiation therapy 08/19/07 - 10/10/07   Left Breast/5040 cGy/28 fractions with Boost for a toatl dose of 6300 dGy  . S/P radiation therapy 08/14/10 -10/03/10   right breast  . Sleep apnea    STOP BANG SCORE 5    Past Surgical History:  Procedure Laterality Date  . ABDOMINAL HYSTERECTOMY     with left salpingooophorectomy  . BREAST LUMPECTOMY  2009/2012   LEFT/RIGHT with lymph node dissection  . KNEE ARTHROSCOPY     right  . OOPHORECTOMY  2013     Current Outpatient Medications on File Prior to Visit  Medication Sig Dispense Refill  . acetaminophen (TYLENOL) 650 MG CR tablet Take 650 mg by mouth every 8 (eight) hours as needed for pain.    Marland Kitchen albuterol (VENTOLIN HFA) 108 (90 Base) MCG/ACT inhaler INHALE 1-2 PUFFS INTO THE LUNGS EVERY 6 (SIX) HOURS AS NEEDED FOR WHEEZING OR SHORTNESS OF BREATH. 18 g 1  . Bisacodyl (DULCOLAX PO) Take 1 capsule by mouth daily as needed (constipation).     Marland Kitchen  cholecalciferol 5000 units TABS Take 1 tablet (5,000 Units total) by mouth 2 (two) times daily. (Patient taking differently: Take 5,000 Units  by mouth daily. )    . cyclobenzaprine (FLEXERIL) 10 MG tablet TAKE 1 TABLET BY MOUTH THREE TIMES A DAY AS NEEDED FOR MUSCLE SPASMS 30 tablet 1  . diphenhydrAMINE (BENADRYL) 25 mg capsule Take 25 mg by mouth 2 (two) times daily.    . fish oil-omega-3 fatty acids 1000 MG capsule Take 1 g by mouth daily.     . Flaxseed, Linseed, (FLAXSEED OIL MAX STR PO) Take 1 capsule by mouth daily.     . Fremanezumab-vfrm (AJOVY) 225 MG/1.5ML SOAJ Inject 225 mg into the skin every 30 (thirty) days. 3 pen 11  . hydrochlorothiazide (HYDRODIURIL) 12.5 MG tablet TAKE 1 TABLET BY MOUTH EVERY DAY 90 tablet 0  . Iron-FA-B Cmp-C-Biot-Probiotic (FUSION PLUS) CAPS Take 1 capsule by mouth daily. 30 capsule 3  . meloxicam (MOBIC) 7.5 MG tablet Take 1 tablet (7.5 mg total) by mouth daily. 90 tablet 1  . Multiple Vitamins-Minerals (MULTIVITAMIN & MINERAL PO) Take 1 tablet by mouth daily.    Marland Kitchen omeprazole (PRILOSEC) 20 MG capsule Take 20 mg by mouth daily.    . promethazine (PHENERGAN) 25 MG tablet Take 1 tablet (25 mg total) by mouth every 6 (six) hours as needed for nausea or vomiting. 30 tablet 0  . propranolol (INDERAL) 20 MG tablet Take 1 tablet (20 mg total) by mouth 2 (two) times daily. 180 tablet 0  . senna (SENOKOT) 8.6 MG tablet Take 1 tablet by mouth daily as needed for constipation.     . SYMBICORT 80-4.5 MCG/ACT inhaler INHALE 2 PUFF BY INHALATION ROUTE EVERY DAY IN THE MORNING AND EVENING 30.6 Inhaler 1  . triamcinolone cream (KENALOG) 0.1 % Apply 1 application topically 2 (two) times daily. Apply for up to one week. (Patient taking differently: Apply 1 application topically 2 (two) times daily as needed (rash). ) 30 g 0  . UNABLE TO FIND Rx: L8000-Post Surgical Bra (Quantity: 6) [id number]- Silicone Breast Prosthesis (Quantity: 2) Dx: 174.9; Bilateral partial mastectomy Replacement due to hot flashes since previous items were too heavy and hot 1 each 0  . venlafaxine XR (EFFEXOR-XR) 150 MG 24 hr capsule TAKE 1 CAPSULE  BY MOUTH SEE ADMIN INSTRUCTIONS. NIGHTLY WITH 75 MG CAPSULE, TOTAL OF 225 MG NIGHTLY 90 capsule 3  . venlafaxine XR (EFFEXOR-XR) 75 MG 24 hr capsule TAKE 1 CAPSULE DAILY ALONG WITH 150MG  CAP TO MAKE IT 225MG  TOTAL 90 capsule 0  . vitamin E 200 UNIT capsule Take 200 Units by mouth daily.     No current facility-administered medications on file prior to visit.    Allergies  Allergen Reactions  . Aspirin     unknown  . Penicillins Hives and Swelling    Has patient had a PCN reaction causing immediate rash, facial/tongue/throat swelling, SOB or lightheadedness with hypotension: Yes Has patient had a PCN reaction causing severe rash involving mucus membranes or skin necrosis: Yes Has patient had a PCN reaction that required hospitalization Yes Has patient had a PCN reaction occurring within the last 10 years: No If all of the above answers are "NO", then may proceed with Cephalosporin use.     Physical exam:  Today's Vitals   05/25/19 1302  BP: (!) 136/92  Pulse: 92  Temp: (!) 96.4 F (35.8 C)  Weight: 294 lb (133.4 kg)  Height: 5\' 2"  (1.575 m)   Body mass  index is 53.77 kg/m.   Wt Readings from Last 3 Encounters:  05/25/19 294 lb (133.4 kg)  05/13/19 295 lb 9.6 oz (134.1 kg)  05/12/19 298 lb 3.2 oz (135.3 kg)     Ht Readings from Last 3 Encounters:  05/25/19 5\' 2"  (1.575 m)  05/13/19 5\' 2"  (1.575 m)  05/12/19 5' 2.2" (1.58 m)      General: The patient is awake, alert and appears not in acute distress. The patient is well groomed. Head: Normocephalic, atraumatic. Neck is supple. Mallampati 4,  neck circumference:17 inches . Nasal airflow  patent.  Retrognathia is seen.  Dental status: not appreciated.  Cardiovascular:  Regular rate and cardiac rhythm by pulse,  without distended neck veins. Respiratory: Lungs are clear to auscultation.  Skin:  evidence of ankle edema,knee edema.  Trunk: The patient's posture is stooped, she is in pain.    Neurologic exam : The  patient is awake and alert, oriented to place and time.   Memory subjective described as intact.  Attention span & concentration ability appears normal.  Speech is fluent,  without  dysarthria, dysphonia or aphasia.  Mood and affect are appropriate.   Cranial nerves: no loss of smell or taste reported  Pupils are equal and briskly reactive to light. Funduscopic exam deferred.  Extraocular movements in vertical and horizontal planes were intact and without nystagmus. No Diplopia. Visual fields by finger perimetry are intact. Hearing was intact to soft voice and finger rubbing.   Facial sensation intact to fine touch, except in both of her ankles, which are swollen/   Facial motor strength is symmetric and tongue and uvula move midline.  Neck ROM : rotation, tilt and flexion extension were normal for age and shoulder shrug was symmetrical.    Motor exam:  Symmetric bulk, tone and ROM.  normal thenar eminance and grip strength. Was unable to provide hip felxion at full strength, unable to abduct at the hip.  Normal tone without cog wheeling, symmetric grip strength .   Sensory:  Fine touch, pinprick and vibration were tested  and  Normal except in both of her ankles, which are swollen/ .  Proprioception tested in the upper extremities was normal.   Coordination: Rapid alternating movements in the fingers/hands were of normal speed.  The Finger-to-nose maneuver was intact without evidence of ataxia, dysmetria or tremor.   Gait and station: Patient could rise unassisted from a seated position, walked with cane - only since her recent fall. Toe and heel walk were deferred.  Deep tendon reflexes: in the  upper and lower extremities are attenuated, knee was too painful to allow patella reflex testing on the right.  Babinski response was deferred l      Mrs. Stiglich certainly had sleep apnea 7 years ago and now almost 8 years ago and she was titrated to CPAP but states that she was never given a  CPAP or unaware that she was prescribed a CPAP.  She responded to a very low pressure and I have wondered if even at that time she may have had obesity hypoventilation rather than actual sleep apnea.  However hypoxemia was not noticed once she was on 5 and 6 cm water pressure but it was notable that she did not sleep well with CPAP or without.  It has been an ongoing problem for her to have super frequent nocturia, high degree of daytime sleepiness, fragmented sleep all night with many spontaneous arousals which can be pain related I also  think that she has a potential to have sleep related headaches from that.  She has undergone radiation therapy for brief chemotherapy and developed lymphedema and pain in her right arm has a very high stop bang score of 5 points.  And is also on medications that will make her more sleepy.  Especially Flexeril, Benadryl, Phenergan, Inderal, I do not know how Effexor has affected her sleep if at all, she is on fluid pills hydrochlorothiazide and not sure why she would have so much nocturia frequently at night if she takes the medication in the morning.  Her BMI is now 54.7 and based on this she is certainly at high risk of obesity hypoventilation I would also like to prove that she has endorsed the Epworth sleepiness score into Dr. Chong Sicilian visit at 19 points and during my visit at 16 points fatigue severity score was endorsed at 41 out of 63 points, geriatric depression score was elevated.  Goal is for [place] to repeat a sleep study and for this patient will need an attended sleep study.  This is not just about screening her for obstructive sleep apnea she is also at risk of central sleep apnea, PLM's or restless legs, and her nocturia may be a manifestation of other related factors.  Please also note that she has orthopnea so I would like to place her in bed to our sleep lab and we need to make sure that you are able to distinguish obstructive sleep apnea from any kind of acid reflux  related apnea which can also interfere.  A home sleep test would not be able to distinguish these factors.  CMET reviewed, Ferritin, TIBC results reviewed. Iron saturation was 44 7 month ago- ferritin was 47 ng. Up from 9 and 18 11 month ago.    After spending a total time of  50  minutes face to face and additional time for physical and neurologic examination, review of laboratory studies,  personal review of imaging studies, reports and results of other testing and review of referral information / records as far as provided in visit, I have established the following assessments:  1) HIGH RISK PATIENT obesity hypoventilation with hypoxemia ,OSA and Nocturia.  2)Anemia and iron deficiency.  Corrected through po iron.  3) HEADACHE - started on EMGALITY, needs help with injection.  4) obesity- referral to weight and wellness?    My Plan is to proceed with:  Goal is for [place] to repeat a sleep study and for this patient will need an attended sleep study.   This is not just about screening her for obstructive sleep apnea she is also at risk of central sleep apnea, PLM's or restless legs, and her nocturia may be a manifestation of other related factors.  Please also note that she has orthopnea so I would like to place her in bed to our sleep lab and we need to make sure that you are able to distinguish obstructive sleep apnea from any kind of acid reflux related apnea which can also interfere.  A home sleep test would not be able to distinguish these factors.   I would like to thank Dr Jaynee Eagles and [name], NP for allowing me to meet with and to take care of this pleasant patient.   In short, Ashley Lindsey is presenting with intractable headaches that are present when she wakes up form sleep, even when going for her frequent nocturia breaks. I plan to follow up through our NP within 2-3 month.  CC: I will share my notes with PCP   Electronically signed by: Larey Seat, MD 05/25/2019 1:15  PM  [facility] [place] certified by The [employer] of Sleep Medicine and Diplomate of the [employer] of Sleep Medicine. Board certified In Neurology through the [place], Fellow of the [employer] of Neurology. Medical Director of [employer].

## 2019-05-25 NOTE — Patient Instructions (Signed)
     Needs COVID test prior to STUDY- yes we do SPLIT,  Give her BED 2 if possible.    Screening for Sleep Apnea  Sleep apnea is a condition in which breathing pauses or becomes shallow during sleep. Sleep apnea screening is a test to determine if you are at risk for sleep apnea. The test is easy and only takes a few minutes. Your health care provider may ask you to have this test in preparation for surgery or as part of a physical exam. What are the symptoms of sleep apnea? Common symptoms of sleep apnea include:  Snoring.  Restless sleep.  Daytime sleepiness.  Pauses in breathing.  Choking during sleep.  Irritability.  Forgetfulness.  Trouble thinking clearly.  Depression.  Personality changes. Most people with sleep apnea are not aware that they have it. Why should I get screened? Getting screened for sleep apnea can help:  Ensure your safety. It is important for your health care providers to know whether or not you have sleep apnea, especially if you are having surgery or have other long-term (chronic) health conditions.  Improve your health and allow you to get a better night's rest. Restful sleep can help you: ? Have more energy. ? Lose weight. ? Improve high blood pressure. ? Improve diabetes management. ? Prevent stroke. ? Prevent car accidents. How is screening done? Screening usually includes being asked a list of questions about your sleep quality. Some questions you may be asked include:  Do you snore?  Is your sleep restless?  Do you have daytime sleepiness?  Has a partner or spouse told you that you stop breathing during sleep?  Have you had trouble concentrating or memory loss? If your screening test is positive, you are at risk for the condition. Further testing may be needed to confirm a diagnosis of sleep apnea. Where to find more information You can find screening tools online or at your health care clinic. For more information about sleep  apnea screening and healthy sleep, visit these websites:  Centers for Disease Control and Prevention: LearningDermat[employer].pl  American Sleep Apnea Association: www.sleepapnea.org Contact a health care provider if:  You think that you may have sleep apnea. Summary  Sleep apnea screening can help determine if you are at risk for sleep apnea.  It is important for your health care providers to know whether or not you have sleep apnea, especially if you are having surgery or have other chronic health conditions.  You may be asked to take a screening test for sleep apnea in preparation for surgery or as part of a physical exam. This information is not intended to replace advice given to you by your health care provider. Make sure you discuss any questions you have with your health care provider. Document Revised: 01/24/2018 Document Reviewed: 07/20/2016 Elsevier Patient Education  [place].

## 2019-05-26 ENCOUNTER — Encounter: Payer: Self-pay | Admitting: Orthopaedic Surgery

## 2019-05-26 ENCOUNTER — Other Ambulatory Visit: Payer: Self-pay

## 2019-05-26 ENCOUNTER — Ambulatory Visit (INDEPENDENT_AMBULATORY_CARE_PROVIDER_SITE_OTHER): Payer: Medicare Other | Admitting: Orthopaedic Surgery

## 2019-05-26 VITALS — Ht 62.0 in | Wt 293.0 lb

## 2019-05-26 DIAGNOSIS — M1711 Unilateral primary osteoarthritis, right knee: Secondary | ICD-10-CM | POA: Diagnosis not present

## 2019-05-26 MED ORDER — LIDOCAINE HCL 1 % IJ SOLN
2.0000 mL | INTRAMUSCULAR | Status: AC | PRN
  Administered 2019-05-26: 2 mL

## 2019-05-26 MED ORDER — BUPIVACAINE HCL 0.5 % IJ SOLN
2.0000 mL | INTRAMUSCULAR | Status: AC | PRN
  Administered 2019-05-26: 2 mL via INTRA_ARTICULAR

## 2019-05-26 MED ORDER — TRAMADOL HCL 50 MG PO TABS: ORAL_TABLET | ORAL | 0 refills | Status: DC

## 2019-05-26 MED ORDER — METHYLPREDNISOLONE ACETATE 40 MG/ML IJ SUSP
80.0000 mg | INTRAMUSCULAR | Status: AC | PRN
  Administered 2019-05-26: 80 mg via INTRA_ARTICULAR

## 2019-05-26 NOTE — Progress Notes (Signed)
Office Visit Note   Patient: Ashley Lindsey           Date of Birth: 08/28/1960           MRN: [id number] Visit Date: 05/26/2019              Requested by: [name], [address] [place],  [place] [id number] PCP: [name], FNP   Assessment & Plan: Visit Diagnoses:  1. Class 3 severe obesity due to excess calories without serious comorbidity with body mass index (BMI) of 40.0 to 44.9 in adult [facility])   2. Unilateral primary osteoarthritis, right knee     Plan: Osteoarthritis right knee discussion with Ashley Lindsey regarding her weight and BMI of close to 53.  She is involved in the health and wellness clinic at [facility] is lost about 8 pounds in the last month.  I will inject her right knee with cortisone and monitor her response  Follow-Up Instructions: Return if symptoms worsen or fail to improve.   Orders:  Orders Placed This Encounter  Procedures  . Large Joint Inj: R knee   No orders of the defined types were placed in this encounter.     Procedures: Large Joint Inj: R knee on 05/26/2019 3:41 PM Indications: pain and diagnostic evaluation Details: 25 G 1.5 in needle, anteromedial approach  Arthrogram: No  Medications: 2 mL lidocaine 1 %; 2 mL bupivacaine 0.5 %; 80 mg methylPREDNISolone acetate 40 MG/ML Procedure, treatment alternatives, risks and benefits explained, specific risks discussed. Consent was given by the patient. Immediately prior to procedure a time out was called to verify the correct patient, procedure, equipment, support staff and site/side marked as required. Patient was prepped and draped in the usual sterile fashion.       Clinical Data: No additional findings.   Subjective: Chief Complaint  Patient presents with  . Right Knee - Pain  Patient presents today for recurrent right knee pain. She fell out of a chair last week and landed on her buttock. She used her right knee to get up and states that it caused it to hurt. She  said that it was swollen, but has improved some. She is taking Tylenol Arthritis and Flexeril. She uses a cane, but states that she did not bring it today.  Last had cortisone injection in January 2020.  Films of the right knee in January 2020 revealed advanced osteoarthritis in all 3 compartments Presently involved in the health and wellness clinic at [facility] attempt to lose weight with a BMI of 53 has lost 8 pounds in the last month avoiding sweets and meats  HPI  Review of Systems   Objective: Vital Signs: Ht 5\' 2"  (1.575 m)   Wt 293 lb (132.9 kg)   BMI 53.59 kg/m   Physical Exam  Ortho Exam large knees.  Right knee with some tenderness along the medial compartment with positive patella crepitation.  Might have a small effusion.  Increased varus.  Predominant medial joint pain.  Just about full extension and flexed about 95 degrees without instability  Specialty Comments:  No specialty comments available.  Imaging: No results found.   PMFS History: Patient Active Problem List   Diagnosis Date Noted  . Unilateral primary osteoarthritis, right knee 05/26/2019  . Chronic migraine without aura without status migrainosus, not intractable 05/13/2019  . Class 3 severe obesity due to excess calories without serious comorbidity with body mass index (BMI) of 40.0 to 44.9 in adult (  [place]) 08/05/2018  . Depression 08/05/2018  . Chronic nonintractable headache 08/05/2018  . Elevated blood-pressure reading without diagnosis of hypertension 06/06/2018  . Chronic pain of right ankle 04/24/2018  . Breast cancer of lower-outer quadrant of right female breast ([place]) 12/02/2014  . Bronchospasm 09/21/2014  . Dyspnea 09/21/2014  . Chronic pain of right knee 09/21/2014  . Fibromyalgia 03/06/2013  . Dense breasts 03/06/2013  . Mastalgia 03/06/2013  . Back pain, lumbosacral 03/06/2013  . Lymphedema of arm 03/06/2013  . Atypical chest pain 01/06/2013  . Breast cancer of upper-inner quadrant of left  female breast ([place]) 09/08/2012  . Family history of malignant neoplasm of breast 09/08/2012  . S/P radiation therapy   . History of breast cancer in female 08/13/2011   Past Medical History:  Diagnosis Date  . Abdominal cramps   . Arthritis   . Cancer [facility])    s/p radiation- last dose 6/12//has been off Tamoxifen 8/12  . Chronic headaches   . Costochondritis   . Fatigue   . Fibromyalgia 03/06/2013  . Lymphedema    RIGHT ARM-////  STATES USE LEFT ARM FOR BP'S  . Migraines   . Muscle spasms of head and/or neck    following to the breast  . Passed out    4th of july  . Personal history of radiation therapy 2010   lt breast   . S/P radiation therapy 08/19/07 - 10/10/07   Left Breast/5040 cGy/28 fractions with Boost for a toatl dose of 6300 dGy  . S/P radiation therapy 08/14/10 -10/03/10   right breast  . Sleep apnea    STOP BANG SCORE 5    Family History  Problem Relation Age of Onset  . Breast cancer Mother 73  . Heart disease Father   . Prostate cancer Maternal Uncle 78  . Cancer Maternal Aunt 63       d. from "female cancer" possibly cervical  . Breast cancer Maternal Aunt 60  . Prostate cancer Paternal Uncle   . Prostate cancer Maternal Grandfather 68  . Prostate cancer Maternal Uncle 80  . Prostate cancer Paternal Uncle   . Breast cancer Cousin 83       mat 1st cousin    Past Surgical History:  Procedure Laterality Date  . ABDOMINAL HYSTERECTOMY     with left salpingooophorectomy  . BREAST LUMPECTOMY  2009/2012   LEFT/RIGHT with lymph node dissection  . KNEE ARTHROSCOPY     right  . OOPHORECTOMY  2013   Social History   Occupational History  . Occupation: disability  Tobacco Use  . Smoking status: Former Smoker    Packs/day: 0.25    Years: 1.00    Pack years: 0.25    Types: Cigarettes  . Smokeless tobacco: Never Used  Substance and Sexual Activity  . Alcohol use: Not Currently  . Drug use: No  . Sexual activity: Not Currently    Birth  control/protection: Surgical

## 2019-05-26 NOTE — Addendum Note (Signed)
Addended by: Lendon Collar on: 05/26/2019 04:14 PM   Modules accepted: Orders

## 2019-06-01 DIAGNOSIS — C50911 Malignant neoplasm of unspecified site of right female breast: Secondary | ICD-10-CM | POA: Diagnosis not present

## 2019-06-04 NOTE — Telephone Encounter (Signed)
I left a message for Dawn @ [facility] and she returned my call. The welcome email was in regards to a wait list. She will reach out to the patient.

## 2019-06-06 ENCOUNTER — Inpatient Hospital Stay (HOSPITAL_COMMUNITY)
Admission: RE | Admit: 2019-06-06 | Discharge: 2019-06-06 | Disposition: A | Discharge status: Home / Self Care | Payer: Self-pay | Source: Ambulatory Visit

## 2019-06-06 NOTE — Progress Notes (Signed)
Patient is going to reschedule her procedure

## 2019-06-08 ENCOUNTER — Other Ambulatory Visit (HOSPITAL_COMMUNITY): Payer: Self-pay

## 2019-06-09 ENCOUNTER — Ambulatory Visit
Admission: RE | Admit: 2019-06-09 | Discharge: 2019-06-09 | Disposition: A | Discharge status: Home / Self Care | Payer: Medicare Other | Source: Ambulatory Visit | Attending: Neurology | Admitting: Neurology

## 2019-06-09 DIAGNOSIS — R51 Headache with orthostatic component, not elsewhere classified: Secondary | ICD-10-CM | POA: Diagnosis not present

## 2019-06-09 DIAGNOSIS — R519 Headache, unspecified: Secondary | ICD-10-CM | POA: Diagnosis not present

## 2019-06-09 DIAGNOSIS — Z853 Personal history of malignant neoplasm of breast: Secondary | ICD-10-CM

## 2019-06-09 DIAGNOSIS — G4719 Other hypersomnia: Secondary | ICD-10-CM

## 2019-06-09 MED ORDER — GADOBENATE DIMEGLUMINE 529 MG/ML IV SOLN
20.0000 mL | Freq: Once | INTRAVENOUS | Status: AC | PRN
  Administered 2019-06-09: 20 mL via INTRAVENOUS

## 2019-06-12 ENCOUNTER — Encounter: Payer: Self-pay | Admitting: Hematology and Oncology

## 2019-06-12 ENCOUNTER — Other Ambulatory Visit: Payer: Self-pay

## 2019-06-12 ENCOUNTER — Encounter: Payer: Self-pay | Admitting: Nurse Practitioner

## 2019-06-12 ENCOUNTER — Telehealth: Payer: Self-pay | Admitting: *Deleted

## 2019-06-12 ENCOUNTER — Encounter: Payer: Self-pay | Admitting: Orthopaedic Surgery

## 2019-06-12 ENCOUNTER — Other Ambulatory Visit: Payer: Self-pay | Admitting: *Deleted

## 2019-06-12 ENCOUNTER — Other Ambulatory Visit: Payer: Self-pay | Admitting: Nurse Practitioner

## 2019-06-12 MED ORDER — VENLAFAXINE HCL ER 75 MG PO CP24: ORAL_CAPSULE | ORAL | 0 refills | Status: DC

## 2019-06-12 MED ORDER — TRAMADOL HCL 50 MG PO TABS: ORAL_TABLET | ORAL | 0 refills | Status: DC

## 2019-06-12 NOTE — Telephone Encounter (Signed)
Cyclobenzaprine refill 

## 2019-06-12 NOTE — Telephone Encounter (Signed)
Received call from pt stating she is unsure of how to take her Effexor capsules.  RN instructed pt she should be taking one capsule of Effexor XR 75 mg daily and one capsule of Effexor XR 150 mg daily to equal 225 mg total daily.  Pt states she is currently doubling that and is taking 450 mg daily. RN reviewed with MD and MD states we will call pharmacy to allow an early refill on her medication.  RN instructed pt to write down instructions on how to take her Effexor dosage.  Pt verbalized dosing instructions and wrote them down.  RN also instructed pt that this is the 2nd time she has over medicated herself with this medication and if it happens again in the future, we will not be able to refill her medication.  Pt verbalized understanding.

## 2019-06-12 NOTE — Telephone Encounter (Signed)
Please renew Tramadol 50mg  330 1 tab po bid prn-thanks. Call pt to let her know med was prescribed

## 2019-06-15 ENCOUNTER — Ambulatory Visit (INDEPENDENT_AMBULATORY_CARE_PROVIDER_SITE_OTHER): Payer: Medicare Other | Admitting: Nurse Practitioner

## 2019-06-15 ENCOUNTER — Other Ambulatory Visit: Payer: Self-pay | Admitting: Nurse Practitioner

## 2019-06-15 ENCOUNTER — Other Ambulatory Visit: Payer: Self-pay | Admitting: Neurology

## 2019-06-15 ENCOUNTER — Encounter: Payer: Self-pay | Admitting: Nurse Practitioner

## 2019-06-15 ENCOUNTER — Other Ambulatory Visit: Payer: Self-pay

## 2019-06-15 VITALS — BP 124/80 | HR 100 | Temp 97.9°F | Ht 62.0 in | Wt 300.0 lb

## 2019-06-15 DIAGNOSIS — G8929 Other chronic pain: Secondary | ICD-10-CM | POA: Diagnosis not present

## 2019-06-15 DIAGNOSIS — K219 Gastro-esophageal reflux disease without esophagitis: Secondary | ICD-10-CM | POA: Diagnosis not present

## 2019-06-15 DIAGNOSIS — R32 Unspecified urinary incontinence: Secondary | ICD-10-CM

## 2019-06-15 DIAGNOSIS — Z6841 Body Mass Index (BMI) 40.0 and over, adult: Secondary | ICD-10-CM

## 2019-06-15 DIAGNOSIS — M545 Low back pain, unspecified: Secondary | ICD-10-CM

## 2019-06-15 DIAGNOSIS — R110 Nausea: Secondary | ICD-10-CM

## 2019-06-15 MED ORDER — AJOVY 225 MG/1.5ML ~~LOC~~ SOAJ: 225.0000 mg | SUBCUTANEOUS | 11 refills | Status: DC

## 2019-06-15 MED ORDER — PROMETHAZINE HCL 25 MG PO TABS: 25.0000 mg | ORAL_TABLET | Freq: Four times a day (QID) | ORAL | 0 refills | Status: DC | PRN

## 2019-06-15 MED ORDER — DEXILANT 60 MG PO CPDR: 60.0000 mg | DELAYED_RELEASE_CAPSULE | Freq: Every day | ORAL | 2 refills | Status: DC

## 2019-06-15 NOTE — Patient Instructions (Signed)

## 2019-06-15 NOTE — Progress Notes (Signed)
This visit occurred during the SARS-CoV-2 public health emergency.  Safety protocols were in place, including screening questions prior to the visit, additional usage of staff PPE, and extensive cleaning of exam room while observing appropriate contact time as indicated for disinfecting solutions.  Subjective:     Patient ID: Ashley Lindsey , female    DOB: [birth year] , 59 y.o.   MRN: [id number]   Chief Complaint  Patient presents with  . Nausea    patient needs a refill on her nausea medicine    HPI  Reports having low back pain after lying on the MRI table for 40 minutes and began having worsening back pain.  She has been sore since that time.  She is also reporting having worsening nausea, worse at night. She has been taking antireflux medication up to 3 times a day without relief.  She is requesting a prescription for nausea.    She is to start with the weight loss center once she gets $99.  Unfortunately her weight is up to 300 lbs. Reports she has cut out sodas and is working out with light weights.  Wt Readings from Last 3 Encounters: 06/15/19 : 300 lb (136.1 kg) 05/26/19 : 293 lb (132.9 kg) 05/25/19 : 294 lb (133.4 kg)     Past Medical History:  Diagnosis Date  . Abdominal cramps   . Arthritis   . Cancer [facility])    s/p radiation- last dose 6/12//has been off Tamoxifen 8/12  . Chronic headaches   . Costochondritis   . Fatigue   . Fibromyalgia 03/06/2013  . Lymphedema    RIGHT ARM-////  STATES USE LEFT ARM FOR BP'S  . Migraines   . Muscle spasms of head and/or neck    following to the breast  . Passed out    4th of july  . Personal history of radiation therapy 2010   lt breast   . S/P radiation therapy 08/19/07 - 10/10/07   Left Breast/5040 cGy/28 fractions with Boost for a toatl dose of 6300 dGy  . S/P radiation therapy 08/14/10 -10/03/10   right breast  . Sleep apnea    STOP BANG SCORE 5     Family History  Problem Relation Age of Onset  . Breast cancer  Mother 63  . Heart disease Father   . Prostate cancer Maternal Uncle 78  . Cancer Maternal Aunt 63       d. from "female cancer" possibly cervical  . Breast cancer Maternal Aunt 60  . Prostate cancer Paternal Uncle   . Prostate cancer Maternal Grandfather 66  . Prostate cancer Maternal Uncle 80  . Prostate cancer Paternal Uncle   . Breast cancer Cousin 46       mat 1st cousin     Current Outpatient Medications:  .  acetaminophen (TYLENOL) 650 MG CR tablet, Take 650 mg by mouth every 8 (eight) hours as needed for pain., Disp: , Rfl:  .  albuterol (VENTOLIN HFA) 108 (90 Base) MCG/ACT inhaler, INHALE 1-2 PUFFS INTO THE LUNGS EVERY 6 (SIX) HOURS AS NEEDED FOR WHEEZING OR SHORTNESS OF BREATH., Disp: 18 g, Rfl: 1 .  Bisacodyl (DULCOLAX PO), Take 1 capsule by mouth daily as needed (constipation). , Disp: , Rfl:  .  cholecalciferol 5000 units TABS, Take 1 tablet (5,000 Units total) by mouth 2 (two) times daily. (Patient taking differently: Take 5,000 Units by mouth daily. ), Disp: , Rfl:  .  cyclobenzaprine (FLEXERIL) 10 MG tablet, TAKE 1 TABLET  BY MOUTH THREE TIMES A DAY AS NEEDED FOR MUSCLE SPASMS, Disp: 30 tablet, Rfl: 1 .  diphenhydrAMINE (BENADRYL) 25 mg capsule, Take 25 mg by mouth 2 (two) times daily., Disp: , Rfl:  .  fish oil-omega-3 fatty acids 1000 MG capsule, Take 1 g by mouth daily. , Disp: , Rfl:  .  Flaxseed, Linseed, (FLAXSEED OIL MAX STR PO), Take 1 capsule by mouth daily. , Disp: , Rfl:  .  hydrochlorothiazide (HYDRODIURIL) 12.5 MG tablet, TAKE 1 TABLET BY MOUTH EVERY DAY, Disp: 90 tablet, Rfl: 0 .  Iron-FA-B Cmp-C-Biot-Probiotic (FUSION PLUS) CAPS, Take 1 capsule by mouth daily., Disp: 30 capsule, Rfl: 3 .  meloxicam (MOBIC) 7.5 MG tablet, TAKE 1 TABLET BY MOUTH EVERY DAY, Disp: 90 tablet, Rfl: 1 .  Multiple Vitamins-Minerals (MULTIVITAMIN & MINERAL PO), Take 1 tablet by mouth daily., Disp: , Rfl:  .  omeprazole (PRILOSEC) 20 MG capsule, Take 20 mg by mouth daily., Disp: , Rfl:   .  promethazine (PHENERGAN) 25 MG tablet, Take 1 tablet (25 mg total) by mouth every 6 (six) hours as needed for nausea or vomiting., Disp: 30 tablet, Rfl: 0 .  propranolol (INDERAL) 20 MG tablet, Take 1 tablet (20 mg total) by mouth 2 (two) times daily., Disp: 180 tablet, Rfl: 0 .  senna (SENOKOT) 8.6 MG tablet, Take 1 tablet by mouth daily as needed for constipation. , Disp: , Rfl:  .  SYMBICORT 80-4.5 MCG/ACT inhaler, INHALE 2 PUFF BY INHALATION ROUTE EVERY DAY IN THE MORNING AND EVENING, Disp: 30.6 Inhaler, Rfl: 1 .  traMADol (ULTRAM) 50 MG tablet, Take 1 tablet by mouth up to twice daily as needed for pain., Disp: 30 tablet, Rfl: 0 .  triamcinolone cream (KENALOG) 0.1 %, Apply 1 application topically 2 (two) times daily. Apply for up to one week. (Patient taking differently: Apply 1 application topically 2 (two) times daily as needed (rash). ), Disp: 30 g, Rfl: 0 .  UNABLE TO FIND, Rx: L8000-Post Surgical Bra (Quantity: 6) [id number]- Silicone Breast Prosthesis (Quantity: 2) Dx: 174.9; Bilateral partial mastectomy Replacement due to hot flashes since previous items were too heavy and hot, Disp: 1 each, Rfl: 0 .  venlafaxine XR (EFFEXOR-XR) 150 MG 24 hr capsule, TAKE 1 CAPSULE BY MOUTH SEE ADMIN INSTRUCTIONS. NIGHTLY WITH 75 MG CAPSULE, TOTAL OF 225 MG NIGHTLY, Disp: 90 capsule, Rfl: 3 .  venlafaxine XR (EFFEXOR-XR) 75 MG 24 hr capsule, Take one capsule once a day in combination with Venlafaxine XR 150 mg capsule (to equal 225 mg total daily)., Disp: 90 capsule, Rfl: 0 .  vitamin E 200 UNIT capsule, Take 200 Units by mouth daily., Disp: , Rfl:  .  Fremanezumab-vfrm (AJOVY) 225 MG/1.5ML SOAJ, Inject 225 mg into the skin every 30 (thirty) days. (Patient not taking: Reported on 06/15/2019), Disp: 3 pen, Rfl: 11   Allergies  Allergen Reactions  . Aspirin     unknown  . Penicillins Hives and Swelling    Has patient had a PCN reaction causing immediate rash, facial/tongue/throat swelling, SOB or  lightheadedness with hypotension: Yes Has patient had a PCN reaction causing severe rash involving mucus membranes or skin necrosis: Yes Has patient had a PCN reaction that required hospitalization Yes Has patient had a PCN reaction occurring within the last 10 years: No If all of the above answers are "NO", then may proceed with Cephalosporin use.      Review of Systems  Constitutional: Negative.  Negative for fatigue.  Respiratory: Negative.  Cardiovascular: Negative.  Negative for chest pain, palpitations and leg swelling.  Gastrointestinal: Positive for nausea.  Endocrine: Negative for polydipsia, polyphagia and polyuria.  Musculoskeletal: Positive for back pain.  Neurological: Negative for dizziness and headaches.  Psychiatric/Behavioral: Negative.      Today's Vitals   06/15/19 1042  BP: 124/80  Pulse: 100  Temp: 97.9 F (36.6 C)  TempSrc: Oral  Weight: 300 lb (136.1 kg)  Height: 5\' 2"  (1.575 m)  PainSc: 8   PainLoc: Knee   Body mass index is 54.87 kg/m.   Objective:  Physical Exam Constitutional:      General: She is not in acute distress.    Appearance: Normal appearance. She is obese.  Cardiovascular:     Rate and Rhythm: Normal rate and regular rhythm.     Pulses: Normal pulses.     Heart sounds: Normal heart sounds. No murmur.  Pulmonary:     Effort: Pulmonary effort is normal. No respiratory distress.     Breath sounds: Normal breath sounds.  Skin:    Capillary Refill: Capillary refill takes less than 2 seconds.  Neurological:     General: No focal deficit present.     Mental Status: She is alert and oriented to person, place, and time.     Cranial Nerves: No cranial nerve deficit.  Psychiatric:        Mood and Affect: Mood normal.        Behavior: Behavior normal.        Thought Content: Thought content normal.        Judgment: Judgment normal.         Assessment And Plan:    1. Chronic bilateral low back pain without sciatica  She was  lying on the MRI table for approximately 40 minutes  She had a steroid injection to her knee in which should help with any inflammation throughout her body  rx for flexeril sent earlier today  2. Nausea  Will try her on dexilant as she describes symptoms of reflux  If not improved will consider referral to GI to evaluate further the cause for persistent nausea - dexlansoprazole (DEXILANT) 60 MG capsule; Take 1 capsule (60 mg total) by mouth daily.  Dispense: 30 capsule; Refill: 2 - promethazine (PHENERGAN) 25 MG tablet; Take 1 tablet (25 mg total) by mouth every 6 (six) hours as needed for nausea or vomiting.  Dispense: 30 tablet; Refill: 0  3. Body mass index (BMI) of 50-59.9 in adult [facility])  Chronic,   She has gained 7 lbs since her last visit  She is waiting to go to the weight loss center once she gets her finances for the copay  4. Gastroesophageal reflux disease without esophagitis  Will try dexilant,   5. Urinary incontinence, unspecified type  Mostly at night, advised her to make sure to follow up about the CPAP as sleep apnea could be causing her episodes of nocturia   [name], FNP    THE PATIENT IS ENCOURAGED TO PRACTICE SOCIAL DISTANCING DUE TO THE COVID-19 PANDEMIC.

## 2019-06-16 ENCOUNTER — Telehealth: Payer: Self-pay | Admitting: *Deleted

## 2019-06-16 NOTE — Telephone Encounter (Signed)
Ajovy PA completed on Cover My Meds. [id number]. Awaiting determination from [employer].

## 2019-06-17 ENCOUNTER — Encounter: Payer: Self-pay | Admitting: *Deleted

## 2019-06-17 NOTE — Telephone Encounter (Signed)
Lets try aimovig then thanks

## 2019-06-17 NOTE — Telephone Encounter (Signed)
Sent pt a mychart message. 

## 2019-06-17 NOTE — Telephone Encounter (Signed)
Per [employer], Ajovy denied. Pt should try Aimovig or provider should provide reasons why the patient cannot take Aimovig.   If we should choose to appeal, fax to [phone]. Reference # [id number].

## 2019-06-18 ENCOUNTER — Other Ambulatory Visit: Payer: Self-pay | Admitting: *Deleted

## 2019-06-18 ENCOUNTER — Telehealth: Payer: Self-pay | Admitting: *Deleted

## 2019-06-18 MED ORDER — AIMOVIG 140 MG/ML ~~LOC~~ SOAJ: 140.0000 mg | SUBCUTANEOUS | 11 refills | Status: DC

## 2019-06-18 NOTE — Telephone Encounter (Signed)
Completed Aimovig 140 mg PA on Cover My Meds. Key: [id number]. Awaiting [employer].

## 2019-06-22 ENCOUNTER — Telehealth: Payer: Self-pay

## 2019-06-22 NOTE — Telephone Encounter (Signed)
I called patient and notified her that we will not refill her emgality because she is on aimovig and was put on it by her neurologist. Ashley Lindsey

## 2019-07-01 ENCOUNTER — Other Ambulatory Visit: Payer: Self-pay | Admitting: *Deleted

## 2019-07-01 ENCOUNTER — Other Ambulatory Visit: Payer: Self-pay | Admitting: Neurology

## 2019-07-01 ENCOUNTER — Telehealth: Payer: Self-pay

## 2019-07-01 MED ORDER — METHYLPREDNISOLONE 4 MG PO TBPK: ORAL_TABLET | ORAL | 1 refills | Status: DC

## 2019-07-01 MED ORDER — RIZATRIPTAN BENZOATE 10 MG PO TBDP: 10.0000 mg | ORAL_TABLET | ORAL | 11 refills | Status: DC | PRN

## 2019-07-01 NOTE — Telephone Encounter (Signed)
Patient called and stated that she is wanting to try Rizatriptan as discussed so that she can get some rest for her sleep study. Please advise.

## 2019-07-01 NOTE — Telephone Encounter (Signed)
Will address in other mychart message encounter from 07/01/2019.

## 2019-07-01 NOTE — Telephone Encounter (Signed)
Dr. Jaynee Eagles aware of pt's message below. Ok to try Benadryl cream.

## 2019-07-02 ENCOUNTER — Other Ambulatory Visit: Payer: Self-pay | Admitting: Orthopaedic Surgery

## 2019-07-02 ENCOUNTER — Encounter: Payer: Self-pay | Admitting: *Deleted

## 2019-07-02 NOTE — Telephone Encounter (Signed)
Please advise 

## 2019-07-02 NOTE — Telephone Encounter (Signed)
Dr. Jaynee Eagles prescribed yesterday. See mychart.

## 2019-07-02 NOTE — Telephone Encounter (Signed)
Needs to wait 1 week

## 2019-07-03 ENCOUNTER — Other Ambulatory Visit: Payer: Self-pay | Admitting: Nurse Practitioner

## 2019-07-03 ENCOUNTER — Other Ambulatory Visit (HOSPITAL_COMMUNITY)
Admission: RE | Admit: 2019-07-03 | Discharge: 2019-07-03 | Disposition: A | Discharge status: Home / Self Care | Payer: Medicare Other | Source: Ambulatory Visit | Attending: Neurology | Admitting: Neurology

## 2019-07-03 DIAGNOSIS — Z01812 Encounter for preprocedural laboratory examination: Secondary | ICD-10-CM | POA: Diagnosis not present

## 2019-07-03 DIAGNOSIS — R030 Elevated blood-pressure reading, without diagnosis of hypertension: Secondary | ICD-10-CM

## 2019-07-03 DIAGNOSIS — Z20822 Contact with and (suspected) exposure to covid-19: Secondary | ICD-10-CM | POA: Insufficient documentation

## 2019-07-03 LAB — SARS CORONAVIRUS 2 (TAT 6-24 HRS): SARS Coronavirus 2: NEGATIVE

## 2019-07-03 NOTE — Telephone Encounter (Signed)
Next week

## 2019-07-06 ENCOUNTER — Ambulatory Visit (INDEPENDENT_AMBULATORY_CARE_PROVIDER_SITE_OTHER): Payer: Medicare Other | Admitting: Neurology

## 2019-07-06 DIAGNOSIS — G8929 Other chronic pain: Secondary | ICD-10-CM

## 2019-07-06 DIAGNOSIS — R0601 Orthopnea: Secondary | ICD-10-CM

## 2019-07-06 DIAGNOSIS — G4733 Obstructive sleep apnea (adult) (pediatric): Secondary | ICD-10-CM

## 2019-07-06 DIAGNOSIS — R519 Headache, unspecified: Secondary | ICD-10-CM

## 2019-07-06 DIAGNOSIS — K219 Gastro-esophageal reflux disease without esophagitis: Secondary | ICD-10-CM

## 2019-07-06 DIAGNOSIS — E669 Obesity, unspecified: Secondary | ICD-10-CM

## 2019-07-06 DIAGNOSIS — G43709 Chronic migraine without aura, not intractable, without status migrainosus: Secondary | ICD-10-CM

## 2019-07-06 DIAGNOSIS — R0683 Snoring: Secondary | ICD-10-CM

## 2019-07-06 DIAGNOSIS — R351 Nocturia: Secondary | ICD-10-CM

## 2019-07-06 NOTE — Progress Notes (Signed)
SARS Coronavirus 2 NEGATIVE NEGATIVE  Comment: (NOTE)  SARS-CoV-2 target nucleic acids are NOT DETECTED.

## 2019-07-07 ENCOUNTER — Ambulatory Visit (INDEPENDENT_AMBULATORY_CARE_PROVIDER_SITE_OTHER): Payer: Medicare Other | Admitting: Nurse Practitioner

## 2019-07-07 ENCOUNTER — Other Ambulatory Visit: Payer: Self-pay

## 2019-07-07 ENCOUNTER — Encounter: Payer: Self-pay | Admitting: Nurse Practitioner

## 2019-07-07 VITALS — BP 132/86 | HR 77 | Temp 98.0°F | Ht 62.0 in | Wt 296.2 lb

## 2019-07-07 DIAGNOSIS — R519 Headache, unspecified: Secondary | ICD-10-CM | POA: Diagnosis not present

## 2019-07-07 DIAGNOSIS — Z6841 Body Mass Index (BMI) 40.0 and over, adult: Secondary | ICD-10-CM | POA: Diagnosis not present

## 2019-07-07 DIAGNOSIS — G8929 Other chronic pain: Secondary | ICD-10-CM | POA: Diagnosis not present

## 2019-07-07 DIAGNOSIS — I10 Essential (primary) hypertension: Secondary | ICD-10-CM

## 2019-07-07 NOTE — Progress Notes (Signed)
This visit occurred during the SARS-CoV-2 public health emergency.  Safety protocols were in place, including screening questions prior to the visit, additional usage of staff PPE, and extensive cleaning of exam room while observing appropriate contact time as indicated for disinfecting solutions.  Subjective:     Patient ID: Ashley Lindsey , female    DOB: 16-Aug-1960 , 59 y.o.   MRN: [id number]   Chief Complaint  Patient presents with  . Hypertension    HPI  She had her sleep study last night.   Wt Readings from Last 3 Encounters: 07/07/19 : 296 lb 3.2 oz (134.4 kg) 06/15/19 : 300 lb (136.1 kg)  05/26/19 : 293 lb (132.9 kg)  She is still waiting to go to the weight loss center has to pay $99.   [facility]   Hypertension This is a chronic problem. The current episode started more than 1 year ago. The problem has been gradually improving since onset. The problem is controlled. Pertinent negatives include no anxiety, chest pain, headaches or palpitations. Risk factors for coronary artery disease include sedentary lifestyle and obesity. There are no compliance problems.  There is no history of chronic renal disease.     Past Medical History:  Diagnosis Date  . Abdominal cramps   . Arthritis   . Cancer [facility])    s/p radiation- last dose 6/12//has been off Tamoxifen 8/12  . Chronic headaches   . Costochondritis   . Fatigue   . Fibromyalgia 03/06/2013  . Lymphedema    RIGHT ARM-////  STATES USE LEFT ARM FOR BP'S  . Migraines   . Muscle spasms of head and/or neck    following to the breast  . Passed out    4th of july  . Personal history of radiation therapy 2010   lt breast   . S/P radiation therapy 08/19/07 - 10/10/07   Left Breast/5040 cGy/28 fractions with Boost for a toatl dose of 6300 dGy  . S/P radiation therapy 08/14/10 -10/03/10   right breast  . Sleep apnea    STOP BANG SCORE 5     Family History  Problem Relation  Age of Onset  . Breast cancer Mother 23  . Heart disease Father   . Prostate cancer Maternal Uncle 78  . Cancer Maternal Aunt 63       d. from "female cancer" possibly cervical  . Breast cancer Maternal Aunt 60  . Prostate cancer Paternal Uncle   . Prostate cancer Maternal Grandfather 51  . Prostate cancer Maternal Uncle 80  . Prostate cancer Paternal Uncle   . Breast cancer Cousin 17       mat 1st cousin     Current Outpatient Medications:  .  acetaminophen (TYLENOL) 650 MG CR tablet, Take 650 mg by mouth every 8 (eight) hours as needed for pain., Disp: , Rfl:  .  albuterol (VENTOLIN HFA) 108 (90 Base) MCG/ACT inhaler, INHALE 1-2 PUFFS INTO THE LUNGS EVERY 6 (SIX) HOURS AS NEEDED FOR WHEEZING OR SHORTNESS OF BREATH., Disp: 18 g, Rfl: 1 .  Bisacodyl (DULCOLAX PO), Take 1 capsule by mouth daily as needed (constipation). , Disp: , Rfl:  .  cholecalciferol 5000 units TABS, Take 1 tablet (5,000 Units total) by mouth 2 (two) times daily. (Patient taking differently: Take 5,000 Units by mouth daily. ), Disp: , Rfl:  .  cyclobenzaprine (FLEXERIL) 10 MG tablet, TAKE 1 TABLET BY MOUTH THREE TIMES A  DAY AS NEEDED FOR MUSCLE SPASMS, Disp: 30 tablet, Rfl: 1 .  dexlansoprazole (DEXILANT) 60 MG capsule, Take 1 capsule (60 mg total) by mouth daily., Disp: 30 capsule, Rfl: 2 .  diphenhydrAMINE (BENADRYL) 25 mg capsule, Take 25 mg by mouth 2 (two) times daily., Disp: , Rfl:  .  fish oil-omega-3 fatty acids 1000 MG capsule, Take 1 g by mouth daily. , Disp: , Rfl:  .  Flaxseed, Linseed, (FLAXSEED OIL MAX STR PO), Take 1 capsule by mouth daily. , Disp: , Rfl:  .  hydrochlorothiazide (HYDRODIURIL) 12.5 MG tablet, TAKE 1 TABLET BY MOUTH EVERY DAY, Disp: 90 tablet, Rfl: 0 .  Iron-FA-B Cmp-C-Biot-Probiotic (FUSION PLUS) CAPS, Take 1 capsule by mouth daily., Disp: 30 capsule, Rfl: 3 .  meloxicam (MOBIC) 7.5 MG tablet, TAKE 1 TABLET BY MOUTH EVERY DAY, Disp: 90 tablet, Rfl: 1 .  Multiple Vitamins-Minerals  (MULTIVITAMIN & MINERAL PO), Take 1 tablet by mouth daily., Disp: , Rfl:  .  omeprazole (PRILOSEC) 20 MG capsule, Take 20 mg by mouth daily., Disp: , Rfl:  .  promethazine (PHENERGAN) 25 MG tablet, Take 1 tablet (25 mg total) by mouth every 6 (six) hours as needed for nausea or vomiting., Disp: 30 tablet, Rfl: 0 .  propranolol (INDERAL) 20 MG tablet, Take 1 tablet (20 mg total) by mouth 2 (two) times daily., Disp: 180 tablet, Rfl: 0 .  rizatriptan (MAXALT-MLT) 10 MG disintegrating tablet, Take 1 tablet (10 mg total) by mouth as needed for migraine. May repeat in 2 hours if needed, Disp: 9 tablet, Rfl: 11 .  senna (SENOKOT) 8.6 MG tablet, Take 1 tablet by mouth daily as needed for constipation. , Disp: , Rfl:  .  SYMBICORT 80-4.5 MCG/ACT inhaler, INHALE 2 PUFF BY INHALATION ROUTE EVERY DAY IN THE MORNING AND EVENING, Disp: 30.6 Inhaler, Rfl: 1 .  traMADol (ULTRAM) 50 MG tablet, Take 1 tablet by mouth up to twice daily as needed for pain., Disp: 30 tablet, Rfl: 0 .  triamcinolone cream (KENALOG) 0.1 %, Apply 1 application topically 2 (two) times daily. Apply for up to one week. (Patient taking differently: Apply 1 application topically 2 (two) times daily as needed (rash). ), Disp: 30 g, Rfl: 0 .  UNABLE TO FIND, Rx: L8000-Post Surgical Bra (Quantity: 6) [id number]- Silicone Breast Prosthesis (Quantity: 2) Dx: 174.9; Bilateral partial mastectomy Replacement due to hot flashes since previous items were too heavy and hot, Disp: 1 each, Rfl: 0 .  venlafaxine XR (EFFEXOR-XR) 150 MG 24 hr capsule, TAKE 1 CAPSULE BY MOUTH SEE ADMIN INSTRUCTIONS. NIGHTLY WITH 75 MG CAPSULE, TOTAL OF 225 MG NIGHTLY, Disp: 90 capsule, Rfl: 3 .  venlafaxine XR (EFFEXOR-XR) 75 MG 24 hr capsule, Take one capsule once a day in combination with Venlafaxine XR 150 mg capsule (to equal 225 mg total daily)., Disp: 90 capsule, Rfl: 0 .  vitamin E 200 UNIT capsule, Take 200 Units by mouth daily., Disp: , Rfl:  .  Erenumab-aooe (AIMOVIG) 140  MG/ML SOAJ, Inject 140 mg into the skin every 30 (thirty) days. (Patient not taking: Reported on 07/07/2019), Disp: 1 pen, Rfl: 11   Allergies  Allergen Reactions  . Aspirin     unknown  . Penicillins Hives and Swelling    Has patient had a PCN reaction causing immediate rash, facial/tongue/throat swelling, SOB or lightheadedness with hypotension: Yes Has patient had a PCN reaction causing severe rash involving mucus membranes or skin necrosis: Yes Has patient had a PCN reaction that required  hospitalization Yes Has patient had a PCN reaction occurring within the last 10 years: No If all of the above answers are "NO", then may proceed with Cephalosporin use.      Review of Systems  Constitutional: Negative.   Respiratory: Negative.   Cardiovascular: Negative for chest pain, palpitations and leg swelling.  Neurological: Negative for dizziness and headaches.  Psychiatric/Behavioral: Negative.      Today's Vitals   07/07/19 1125  BP: 132/86  Pulse: 77  Temp: 98 F (36.7 C)  TempSrc: Oral  SpO2: 94%  Weight: 296 lb 3.2 oz (134.4 kg)  Height: 5\' 2"  (1.575 m)   Body mass index is 54.18 kg/m.   Objective:  Physical Exam Vitals reviewed.  Constitutional:      Appearance: Normal appearance.  Cardiovascular:     Rate and Rhythm: Normal rate and regular rhythm.     Pulses: Normal pulses.     Heart sounds: Normal heart sounds. No murmur.  Pulmonary:     Effort: Pulmonary effort is normal. No respiratory distress.     Breath sounds: Normal breath sounds.  Neurological:     General: No focal deficit present.     Mental Status: She is alert and oriented to person, place, and time.  Psychiatric:        Mood and Affect: Mood normal.        Behavior: Behavior normal.        Thought Content: Thought content normal.        Judgment: Judgment normal.         Assessment And Plan:     1. Essential hypertension  Chronic, blood pressure is fairly conrolled  Continue with  current medications  2. Chronic nonintractable headache, unspecified headache type  Continue with follow up with Neurology and continue with monthly medication Aimovig  3. Body mass index (BMI) of 50-59.9 in adult [facility])  She has had 4 lb weight loss since her last visit  Continue to focus on healthy diet and regular physical activity   [name], FNP    THE PATIENT IS ENCOURAGED TO PRACTICE SOCIAL DISTANCING DUE TO THE COVID-19 PANDEMIC.

## 2019-07-08 NOTE — Telephone Encounter (Addendum)
Per [employer], Aimovig denied d/t pt not meeting the requirements. She can apply for the amgen safety net foundation. I sent her a mychart message last week. We will see what she decides. If we choose to appeal, fax to [phone]. Reference # [id number].

## 2019-07-09 ENCOUNTER — Other Ambulatory Visit: Payer: Self-pay | Admitting: Nurse Practitioner

## 2019-07-09 ENCOUNTER — Other Ambulatory Visit: Payer: Self-pay | Admitting: Medical

## 2019-07-09 DIAGNOSIS — G8929 Other chronic pain: Secondary | ICD-10-CM

## 2019-07-09 DIAGNOSIS — R519 Headache, unspecified: Secondary | ICD-10-CM

## 2019-07-09 DIAGNOSIS — F329 Major depressive disorder, single episode, unspecified: Secondary | ICD-10-CM

## 2019-07-09 DIAGNOSIS — F32A Depression, unspecified: Secondary | ICD-10-CM

## 2019-07-09 DIAGNOSIS — Z6841 Body Mass Index (BMI) 40.0 and over, adult: Secondary | ICD-10-CM

## 2019-07-13 NOTE — Telephone Encounter (Signed)
Refill request

## 2019-07-14 DIAGNOSIS — R0601 Orthopnea: Secondary | ICD-10-CM | POA: Insufficient documentation

## 2019-07-14 DIAGNOSIS — G8929 Other chronic pain: Secondary | ICD-10-CM | POA: Insufficient documentation

## 2019-07-14 DIAGNOSIS — R0683 Snoring: Secondary | ICD-10-CM | POA: Insufficient documentation

## 2019-07-14 DIAGNOSIS — R351 Nocturia: Secondary | ICD-10-CM | POA: Insufficient documentation

## 2019-07-14 DIAGNOSIS — K219 Gastro-esophageal reflux disease without esophagitis: Secondary | ICD-10-CM | POA: Insufficient documentation

## 2019-07-14 DIAGNOSIS — R079 Chest pain, unspecified: Secondary | ICD-10-CM | POA: Insufficient documentation

## 2019-07-14 DIAGNOSIS — R519 Headache, unspecified: Secondary | ICD-10-CM | POA: Insufficient documentation

## 2019-07-14 DIAGNOSIS — E669 Obesity, unspecified: Secondary | ICD-10-CM | POA: Insufficient documentation

## 2019-07-14 NOTE — Addendum Note (Signed)
Addended by: Larey Seat on: 07/14/2019 05:33 PM   Modules accepted: Orders

## 2019-07-14 NOTE — Progress Notes (Signed)
IMPRESSION:   1. Mild Obstructive Sleep Apnea (OSA) at AHI of 6.1 with severe  hypoxemia- there were 112 minutes of sleep recorded during which  the oxygen saturation went as low as 84%   RECOMMENDATIONS:   1. Advise full night, attended, CPAP titration study to optimize  therapy.  2. We need to use the titration study to titrate to oxygen if  needed. Please make technologist aware of this need.

## 2019-07-14 NOTE — Procedures (Signed)
PATIENT'S NAME:  Ashley Lindsey, Ashley Lindsey DOB:      [birth year]      MR#:    [id number]     DATE OF RECORDING: 07/06/2019 REFERRING M.D.:  Sarina Ill. MD  Study Performed:   Baseline Polysomnogram HISTORY:  59 y.o. year old African American female patient seen here as a referral on 05/25/2019 referred by Dr. Sarina Ill, MD. Chief concern according to patient:   Mrs. Strehle has suffered from sleep related headaches, often woke up with headaches, the headaches have been chronic, intractable. She is excessively sleepy.   The patient endorsed the Epworth Sleepiness Scale at 16/24 points.   The patient's weight 293 pounds with a height of 62 (inches), resulting in a BMI of 54. kg/m2. The patient's neck circumference measured 17 inches.  CURRENT MEDICATIONS: Tylenol, Ventolin, Dulcolax, Flexeril, Benadryl, Ajovy, Hydrodiuril, Mobic, Prilosec, Phenergan, Inderal, Senokot, Symbicort, Effexor   PROCEDURE:  This is a multichannel digital polysomnogram utilizing the Somnostar 11.2 system.  Electrodes and sensors were applied and monitored per AASM Specifications.   EEG, EOG, Chin and Limb EMG, were sampled at 200 Hz.  ECG, Snore and Nasal Pressure, Thermal Airflow, Respiratory Effort, CPAP Flow and Pressure, Oximetry was sampled at 50 Hz. Digital video and audio were recorded.      BASELINE STUDY: Lights Out was at 21:54 and Lights On at 04:59.  Total recording time (TRT) was 426 minutes, with a total sleep time (TST) of 305.5 minutes.   The patient's sleep latency was 62.5 minutes.  REM latency was 341 minutes.  The sleep efficiency was 71.7 %.     SLEEP ARCHITECTURE: WASO (Wake after sleep onset) was 58 minutes.  There were 94 minutes in Stage N1, 171 minutes Stage N2, 32.5 minutes Stage N3 and 8 minutes in Stage REM.  The percentage of Stage N1 was 30.8%, Stage N2 was 56.%, Stage N3 was 10.6% and Stage R (REM sleep) was 2.6%.   RESPIRATORY ANALYSIS:  There were a total of 31 respiratory events:  0 apneas and 31  hypopneas with 0 respiratory event related arousals (RERAs).      The total APNEA/HYPOPNEA INDEX (AHI) was 6.1/hour.  1 events occurred in REM sleep and 60 events in NREM. The REM AHI was  7.5 /hour, versus a non-REM AHI of 6.1. The patient spent 124.5 minutes of total sleep time in the supine position and 181 minutes in non-supine. The supine AHI was 9.2/h versus a non-supine AHI of 4.0/h.  OXYGEN SATURATION & C02:  The Wake baseline 02 saturation was 91%, with the lowest being 84%. Time spent below 89% saturation equaled 112 minutes. The arousals were noted as: 70 were spontaneous, 0 were associated with PLMs, 6 were associated with respiratory events. The patient had a total of 0 Periodic Limb Movements.    Audio and video analysis did not show any abnormal or unusual movements, behaviors, phonations or vocalizations.  Sleep was extremely fragmented. EKG was in keeping with normal sinus rhythm (NSR).  IMPRESSION:  1. Mild Obstructive Sleep Apnea (OSA) at AHI of 6.1 with severe hypoxemia- there were 112 minutes of sleep recorded during which the oxygen saturation went as low as 84%   RECOMMENDATIONS:  1. Advise full night, attended, CPAP titration study to optimize therapy. 2. We need to use the titration study to titrate to oxygen if needed. Please make technologist aware of this need.      I certify that I have reviewed the entire raw data recording prior to  the issuance of this report in accordance with the Standards of Accreditation of the American Academy of Sleep Medicine (AASM)    Larey Seat, MD Diplomat, American Board of Psychiatry and Neurology  Diplomat, American Board of Sleep Medicine Market researcher, [place] [facility] [employer]

## 2019-07-21 ENCOUNTER — Telehealth: Payer: Self-pay

## 2019-07-21 NOTE — Telephone Encounter (Signed)
-----   Message from Darleen Crocker, RN sent at 07/15/2019  8:23 AM EDT -----  ----- Message ----- From: Larey Seat, MD Sent: 07/14/2019   5:33 PM EDT To: Darleen Crocker, RN, Melvenia Beam, MD  IMPRESSION:   1. Mild Obstructive Sleep Apnea (OSA) at AHI of 6.1 with severe  hypoxemia- there were 112 minutes of sleep recorded during which  the oxygen saturation went as low as 84%   RECOMMENDATIONS:   1. Advise full night, attended, CPAP titration study to optimize  therapy.  2. We need to use the titration study to titrate to oxygen if  needed. Please make technologist aware of this need.

## 2019-07-21 NOTE — Telephone Encounter (Signed)
LVM for patient to call me back to discuss sleep study results

## 2019-07-23 ENCOUNTER — Encounter: Payer: Self-pay | Admitting: Neurology

## 2019-07-23 ENCOUNTER — Other Ambulatory Visit: Payer: Self-pay | Admitting: Nurse Practitioner

## 2019-07-23 DIAGNOSIS — R030 Elevated blood-pressure reading, without diagnosis of hypertension: Secondary | ICD-10-CM

## 2019-07-23 DIAGNOSIS — R110 Nausea: Secondary | ICD-10-CM

## 2019-07-29 ENCOUNTER — Other Ambulatory Visit: Payer: Self-pay | Admitting: Nurse Practitioner

## 2019-07-29 ENCOUNTER — Encounter: Payer: Self-pay | Admitting: Nurse Practitioner

## 2019-07-29 ENCOUNTER — Telehealth: Payer: Self-pay

## 2019-07-29 NOTE — Telephone Encounter (Signed)
I called pt. I advised pt that Dr. Brett Fairy reviewed their sleep study results and found that the sleep study showed Mild Obstructive Sleep Apnea with an AHI of 6 and low oxygen saturation of 84% and recommends that pt be treated with a cpap. Dr. Brett Fairy recommends that pt return for a repeat sleep study in order to properly titrate the cpap and ensure a good mask fit. Pt is agreeable to returning for a titration study. I advised pt that our sleep lab will file with pt's insurance and call pt to schedule the sleep study when we hear back from the pt's insurance regarding coverage of this sleep study. Pt verbalized understanding of results. Pt had no questions at this time but was encouraged to call back if questions arise.  Pt is scheduled for CPAP study on 08/12/2019 at 9pm and covid test on 08/10/2019 at 2:55pm

## 2019-07-30 ENCOUNTER — Other Ambulatory Visit: Payer: Self-pay

## 2019-07-30 ENCOUNTER — Encounter: Payer: Self-pay | Admitting: Nurse Practitioner

## 2019-07-30 DIAGNOSIS — M797 Fibromyalgia: Secondary | ICD-10-CM

## 2019-07-30 NOTE — Telephone Encounter (Signed)
Refill request cyclobenzaprine

## 2019-08-04 ENCOUNTER — Other Ambulatory Visit: Payer: Self-pay | Admitting: Hematology and Oncology

## 2019-08-04 ENCOUNTER — Encounter: Payer: Self-pay | Admitting: Nurse Practitioner

## 2019-08-07 ENCOUNTER — Other Ambulatory Visit: Payer: Self-pay | Admitting: Nurse Practitioner

## 2019-08-07 DIAGNOSIS — R110 Nausea: Secondary | ICD-10-CM

## 2019-08-10 ENCOUNTER — Other Ambulatory Visit (HOSPITAL_COMMUNITY)
Admission: RE | Admit: 2019-08-10 | Discharge: 2019-08-10 | Disposition: A | Discharge status: Home / Self Care | Payer: Medicare Other | Source: Ambulatory Visit | Attending: Neurology | Admitting: Neurology

## 2019-08-10 DIAGNOSIS — Z01812 Encounter for preprocedural laboratory examination: Secondary | ICD-10-CM | POA: Diagnosis not present

## 2019-08-10 DIAGNOSIS — Z20822 Contact with and (suspected) exposure to covid-19: Secondary | ICD-10-CM | POA: Diagnosis not present

## 2019-08-10 LAB — SARS CORONAVIRUS 2 (TAT 6-24 HRS): SARS Coronavirus 2: NEGATIVE

## 2019-08-11 NOTE — Progress Notes (Signed)
Negative corona test

## 2019-08-12 ENCOUNTER — Ambulatory Visit (INDEPENDENT_AMBULATORY_CARE_PROVIDER_SITE_OTHER): Payer: Medicare Other | Admitting: Neurology

## 2019-08-12 ENCOUNTER — Other Ambulatory Visit: Payer: Self-pay

## 2019-08-12 DIAGNOSIS — G4733 Obstructive sleep apnea (adult) (pediatric): Secondary | ICD-10-CM | POA: Diagnosis not present

## 2019-08-12 DIAGNOSIS — G43709 Chronic migraine without aura, not intractable, without status migrainosus: Secondary | ICD-10-CM

## 2019-08-12 DIAGNOSIS — R0601 Orthopnea: Secondary | ICD-10-CM

## 2019-08-12 DIAGNOSIS — G8929 Other chronic pain: Secondary | ICD-10-CM

## 2019-08-12 DIAGNOSIS — E669 Obesity, unspecified: Secondary | ICD-10-CM

## 2019-08-12 DIAGNOSIS — R519 Headache, unspecified: Secondary | ICD-10-CM

## 2019-08-23 ENCOUNTER — Encounter: Payer: Self-pay | Admitting: Nurse Practitioner

## 2019-08-24 ENCOUNTER — Encounter: Payer: Self-pay | Admitting: Nurse Practitioner

## 2019-08-24 ENCOUNTER — Telehealth: Payer: Self-pay | Admitting: Neurology

## 2019-08-24 DIAGNOSIS — G4734 Idiopathic sleep related nonobstructive alveolar hypoventilation: Secondary | ICD-10-CM | POA: Insufficient documentation

## 2019-08-24 DIAGNOSIS — G4733 Obstructive sleep apnea (adult) (pediatric): Secondary | ICD-10-CM | POA: Insufficient documentation

## 2019-08-24 NOTE — Telephone Encounter (Signed)
I called pt. I advised pt that Dr. Brett Fairy reviewed their sleep study results and found that pt has sleep apnea. Dr. Brett Fairy recommends that pt starts auto CPAP. I reviewed PAP compliance expectations with the pt. Pt is agreeable to starting a CPAP. I advised pt that an order will be sent to a DME, [facility], and [facility] will call the pt within about one week after they file with the pt's insurance. [facility] will show the pt how to use the machine, fit for masks, and troubleshoot the CPAP if needed. A follow up appt was made for insurance purposes with Ward Givens, NP on July 8,2021 at 8 am. Pt verbalized understanding to arrive 15 minutes early and bring their CPAP. A letter with all of this information in it will be mailed to the pt as a reminder. I verified with the pt that the address we have on file is correct. Pt verbalized understanding of results. Pt had no questions at this time but was encouraged to call back if questions arise. I have sent the order to [facility] and have received confirmation that they have received the order.

## 2019-08-24 NOTE — Progress Notes (Signed)
DIAGNOSIS: Primary Snoring, Obstructive Sleep Apnea and Sleep  Related Hypoxemia all improved under CPAP of 11 cm water with 3  cm EPR and using a ResMed F 30 FFM in medium.    PLANS/RECOMMENDATIONS: I will order an autotitration capable  device set with a window from 6 through 12 cm water, 3 cm EPR,  heated humidity. No oxygen was needed.  1. Any apnea patient should avoid sedatives, hypnotics, and  alcohol consumption at bedtime.  2. CPAP therapy compliance is defined as 4 hours or more of  nightly use.  3.  DISCUSSION: A follow up appointment will be scheduled with our  NPs in the Sleep Clinic at [facility].   Please call [phone] with any questions.

## 2019-08-24 NOTE — Telephone Encounter (Signed)
-----   Message from Larey Seat, MD sent at 08/24/2019  4:55 PM EDT ----- DIAGNOSIS: Primary Snoring, Obstructive Sleep Apnea and Sleep  Related Hypoxemia all improved under CPAP of 11 cm water with 3  cm EPR and using a ResMed F 30 FFM in medium.    PLANS/RECOMMENDATIONS: I will order an autotitration capable  device set with a window from 6 through 12 cm water, 3 cm EPR,  heated humidity. No oxygen was needed.  1. Any apnea patient should avoid sedatives, hypnotics, and  alcohol consumption at bedtime.  2. CPAP therapy compliance is defined as 4 hours or more of  nightly use.  3.  DISCUSSION: A follow up appointment will be scheduled with our  NPs in the Sleep Clinic at [facility].   Please call [phone] with any questions.

## 2019-08-24 NOTE — Procedures (Signed)
PATIENT'S NAME:  Ashley Lindsey, Ashley Lindsey DOB:      1961/03/07      MR#:    [id number]     DATE OF RECORDING: 08/12/2019 REFERRING M.D.:  Sarina Ill, MD and [name], FNP  Study Performed:   Titration to positive airway pressure  59 y.o. year old African American female patient seen here as a referral on 05/25/2019 referred by Dr. Sarina Ill, MD. Chief concern according to patient:   Mrs. Dazey has suffered from sleep related headaches, often wakes up with headaches, the headaches have been chronic, intractable. She is excessively sleepy.   She returns after her PSG from 3-15 -2021 revealed an AHI of 6.1/h and 112 minutes of hypoxia.  Dx: Mild Obstructive Sleep Apnea (OSA) with severe hypoxemia- there were 112 minutes of sleep recorded during which  the oxygen saturation went as low as 84%    The patient endorsed the Epworth Sleepiness Scale at 16 points and the Fatigue Score at 41-points.   The patient's weight of 293 pounds with a height of 62 (inches), results in a BMI of 54.0 kg/m2. The patient's neck circumference measured 17 inches.  CURRENT MEDICATIONS: Tylenol, Ventolin, Dulcolax, Flexeril, Benadryl, Ajovy, Hydrodiuril, Mobic, Prilosec, Phenergan, Inderal, Senokot, Symbicort, Effexor  PROCEDURE:  This is a multichannel digital polysomnogram utilizing the SomnoStar 11.2 system.  Electrodes and sensors were applied and monitored per AASM Specifications.   EEG, EOG, Chin and Limb EMG, were sampled at 200 Hz.  ECG, Snore and Nasal Pressure, Thermal Airflow, Respiratory Effort, CPAP Flow and Pressure, Oximetry was sampled at 50 Hz. Digital video and audio were recorded.       CPAP was initiated under a FFM , ResMed F30 in medium size. The pressure titration began at 5 cmH20 with heated humidity per AASM split night standards and pressure was advanced to 11.0 cmH20 because of hypopneas, apneas and desaturations.  At a PAP pressure of 11 cm water with 3 cmH20 EPR, there was a reduction of the AHI  to 0.0 with improvement of sleep apnea over 49 minutes and nadir at 88%.  Lights Out was at 22:10 and Lights On at 05:00. Total recording time (TRT) was 410 minutes, with a total sleep time (TST) of 242 minutes. The patient's sleep latency was 28.5 minutes. REM latency was 0 minutes.  The sleep efficiency was 59.0 %.    SLEEP ARCHITECTURE: WASO (Wake after sleep onset) was 139.5 minutes.  There were 32 minutes in Stage N1, 174 minutes Stage N2, 36 minutes Stage N3 and 0 minutes in Stage REM.  The percentage of Stage N1 was 13.2%, Stage N2 was 71.9%, Stage N3 was 14.9% and Stage R (REM sleep) was 0%.    RESPIRATORY ANALYSIS:  There was a total of 12 respiratory events: 0 obstructive apneas, 0 central apneas and 0 mixed apneas with a total of 0 apneas and an apnea index (AI) of 0 /hour. There were 12 hypopneas with a hypopnea index of 3./hour. The patient also had 9 respiratory event related arousals (RERAs).      The total APNEA/HYPOPNEA INDEX (AHI) was 3. /hour and the total RESPIRATORY DISTURBANCE INDEX was 5.2 /hour.  0 events occurred in REM sleep and 12 events in NREM. The REM AHI was 0 /hour versus a non-REM AHI of 3. /hour. The patient spent 187.5 minutes of total sleep time in the supine position and 55 minutes in non-supine. The supine AHI was 1.6, versus a non-supine AHI of 7.7.  OXYGEN SATURATION &  C02:  The baseline 02 saturation was 90%, with the lowest being 83%. Time spent below 89% saturation equaled 24 minutes.  The arousals were noted as: 15 were spontaneous, 0 were associated with PLMs, 12 were associated with respiratory events. The patient had a total of 0 Periodic Limb Movements.  Audio and video analysis did not show any abnormal or unusual movements, behaviors, phonations or vocalizations.  The patient used albuterol inhaler twice during the night and slept in an adjustable bed.  The patient took 2 bathroom breaks. Snoring was noted until 11 cm water pressure was reached. EKG  was in keeping with normal sinus rhythm (NSR).   DIAGNOSIS: Primary Snoring, Obstructive Sleep Apnea and Sleep Related Hypoxemia all improved under CPAP of 11 cm water with 3 cm EPR and using a ResMed F 30 FFM in medium.    PLANS/RECOMMENDATIONS: I will order an autotitration capable device set with a window from 6 through 12 cm water, 3 cm EPR, heated humidity. No oxygen was needed.  1. Any apnea patient should avoid sedatives, hypnotics, and alcohol consumption at bedtime. 2. CPAP therapy compliance is defined as 4 hours or more of nightly use. 3.   DISCUSSION: A follow up appointment will be scheduled with our NPs in the Sleep Clinic at [address] [facility].  Please call [phone] with any questions.      I certify that I have reviewed the entire raw data recording prior to the issuance of this report in accordance with the Standards of Accreditation of the American Academy of Sleep Medicine (AASM)   Larey Seat, M.D. Diplomat, Tax adviser of Psychiatry and Neurology  Diplomat, Tax adviser of Sleep Medicine Market researcher, [facility] [employer]

## 2019-08-24 NOTE — Addendum Note (Signed)
Addended by: Larey Seat on: 08/24/2019 04:55 PM   Modules accepted: Orders

## 2019-08-25 ENCOUNTER — Ambulatory Visit: Payer: Self-pay | Admitting: Nurse Practitioner

## 2019-08-26 ENCOUNTER — Ambulatory Visit (INDEPENDENT_AMBULATORY_CARE_PROVIDER_SITE_OTHER): Payer: Medicare Other | Admitting: Nurse Practitioner

## 2019-08-26 ENCOUNTER — Encounter: Payer: Self-pay | Admitting: Physical Medicine and Rehabilitation

## 2019-08-26 ENCOUNTER — Encounter: Payer: Self-pay | Admitting: Nurse Practitioner

## 2019-08-26 ENCOUNTER — Other Ambulatory Visit: Payer: Self-pay | Admitting: Nurse Practitioner

## 2019-08-26 ENCOUNTER — Other Ambulatory Visit: Payer: Self-pay

## 2019-08-26 VITALS — BP 124/80 | HR 94 | Temp 98.0°F | Ht 65.0 in | Wt 303.2 lb

## 2019-08-26 DIAGNOSIS — M25719 Osteophyte, unspecified shoulder: Secondary | ICD-10-CM | POA: Diagnosis not present

## 2019-08-26 DIAGNOSIS — R600 Localized edema: Secondary | ICD-10-CM | POA: Diagnosis not present

## 2019-08-26 DIAGNOSIS — M25572 Pain in left ankle and joints of left foot: Secondary | ICD-10-CM | POA: Diagnosis not present

## 2019-08-26 DIAGNOSIS — R0600 Dyspnea, unspecified: Secondary | ICD-10-CM

## 2019-08-26 DIAGNOSIS — R609 Edema, unspecified: Secondary | ICD-10-CM | POA: Diagnosis not present

## 2019-08-26 DIAGNOSIS — M25571 Pain in right ankle and joints of right foot: Secondary | ICD-10-CM

## 2019-08-26 MED ORDER — HYDROCHLOROTHIAZIDE 25 MG PO TABS: 12.5000 mg | ORAL_TABLET | Freq: Every day | ORAL | 1 refills | Status: DC

## 2019-08-26 MED ORDER — TRIAMCINOLONE ACETONIDE 40 MG/ML IJ SUSP
60.0000 mg | Freq: Once | INTRAMUSCULAR | Status: AC
  Administered 2019-08-26: 60 mg via INTRAMUSCULAR

## 2019-08-26 NOTE — Patient Instructions (Signed)
   Wear support socks during the day  Avoid high salt foods and stay well hydrated with water  Continue with focusing on weight loss

## 2019-08-26 NOTE — Progress Notes (Signed)
This visit occurred during the SARS-CoV-2 public health emergency.  Safety protocols were in place, including screening questions prior to the visit, additional usage of staff PPE, and extensive cleaning of exam room while observing appropriate contact time as indicated for disinfecting solutions.  Subjective:     Patient ID: Ashley Lindsey , female    DOB: Sep 19, 1960 , 59 y.o.   MRN: [id number]   Chief Complaint  Patient presents with  . Foot Pain    HPI  Wt Readings from Last 3 Encounters: 08/26/19 : (!) 303 lb 3.2 oz (137.5 kg) 07/07/19 : 296 lb 3.2 oz (134.4 kg) 06/15/19 : 300 lb (136.1 kg)  She has an appt with [facility] for her Fibromyalgia.   Foot Pain This is a chronic problem. The current episode started in the past 7 days. The problem occurs constantly. Pertinent negatives include no congestion, coughing, fatigue, headaches or myalgias. She has tried nothing for the symptoms.     Past Medical History:  Diagnosis Date  . Abdominal cramps   . Arthritis   . Cancer [facility])    s/p radiation- last dose 6/12//has been off Tamoxifen 8/12  . Chronic headaches   . Costochondritis   . Fatigue   . Fibromyalgia 03/06/2013  . Lymphedema    RIGHT ARM-////  STATES USE LEFT ARM FOR BP'S  . Migraines   . Muscle spasms of head and/or neck    following to the breast  . Passed out    4th of july  . Personal history of radiation therapy 2010   lt breast   . S/P radiation therapy 08/19/07 - 10/10/07   Left Breast/5040 cGy/28 fractions with Boost for a toatl dose of 6300 dGy  . S/P radiation therapy 08/14/10 -10/03/10   right breast  . Sleep apnea    STOP BANG SCORE 5     Family History  Problem Relation Age of Onset  . Breast cancer Mother 64  . Heart disease Father   . Prostate cancer Maternal Uncle 78  . Cancer Maternal Aunt 63       d. from "female cancer" possibly cervical  . Breast cancer Maternal Aunt 60  . Prostate cancer Paternal Uncle   . Prostate cancer  Maternal Grandfather 57  . Prostate cancer Maternal Uncle 80  . Prostate cancer Paternal Uncle   . Breast cancer Cousin 79       mat 1st cousin     Current Outpatient Medications:  .  acetaminophen (TYLENOL) 650 MG CR tablet, Take 650 mg by mouth every 8 (eight) hours as needed for pain., Disp: , Rfl:  .  albuterol (VENTOLIN HFA) 108 (90 Base) MCG/ACT inhaler, INHALE 1-2 PUFFS INTO THE LUNGS EVERY 6 (SIX) HOURS AS NEEDED FOR WHEEZING OR SHORTNESS OF BREATH., Disp: 8 g, Rfl: 1 .  Bisacodyl (DULCOLAX PO), Take 1 capsule by mouth daily as needed (constipation). , Disp: , Rfl:  .  cholecalciferol 5000 units TABS, Take 1 tablet (5,000 Units total) by mouth 2 (two) times daily. (Patient taking differently: Take 5,000 Units by mouth daily. ), Disp: , Rfl:  .  cyclobenzaprine (FLEXERIL) 10 MG tablet, TAKE 1 TABLET BY MOUTH TWICE A DAY AS NEEDED FOR MUSCLE SPASM, Disp: 30 tablet, Rfl: 1 .  DEXILANT 60 MG capsule, TAKE 1 CAPSULE BY MOUTH EVERY DAY, Disp: 30 capsule, Rfl: 2 .  diphenhydrAMINE (BENADRYL) 25 mg capsule, Take 25 mg by mouth 2 (two) times daily., Disp: , Rfl:  .  fish oil-omega-3 fatty acids 1000 MG capsule, Take 1 g by mouth daily. , Disp: , Rfl:  .  Flaxseed, Linseed, (FLAXSEED OIL MAX STR PO), Take 1 capsule by mouth daily. , Disp: , Rfl:  .  hydrochlorothiazide (HYDRODIURIL) 12.5 MG tablet, TAKE 1 TABLET BY MOUTH EVERY DAY, Disp: 90 tablet, Rfl: 0 .  Iron-FA-B Cmp-C-Biot-Probiotic (FUSION PLUS) CAPS, Take 1 capsule by mouth daily., Disp: 30 capsule, Rfl: 3 .  meloxicam (MOBIC) 7.5 MG tablet, TAKE 1 TABLET BY MOUTH EVERY DAY, Disp: 90 tablet, Rfl: 1 .  Multiple Vitamins-Minerals (MULTIVITAMIN & MINERAL PO), Take 1 tablet by mouth daily., Disp: , Rfl:  .  omeprazole (PRILOSEC) 20 MG capsule, Take 20 mg by mouth daily., Disp: , Rfl:  .  promethazine (PHENERGAN) 25 MG tablet, TAKE 1 TABLET (25 MG TOTAL) BY MOUTH EVERY 6 (SIX) HOURS AS NEEDED FOR NAUSEA OR VOMITING., Disp: 30 tablet, Rfl:  0 .  propranolol (INDERAL) 20 MG tablet, TAKE 1 TABLET BY MOUTH TWICE A DAY, Disp: 180 tablet, Rfl: 0 .  rizatriptan (MAXALT-MLT) 10 MG disintegrating tablet, Take 1 tablet (10 mg total) by mouth as needed for migraine. May repeat in 2 hours if needed, Disp: 9 tablet, Rfl: 11 .  senna (SENOKOT) 8.6 MG tablet, Take 1 tablet by mouth daily as needed for constipation. , Disp: , Rfl:  .  SYMBICORT 80-4.5 MCG/ACT inhaler, INHALE 2 PUFF BY INHALATION ROUTE EVERY DAY IN THE MORNING AND EVENING, Disp: 30.6 Inhaler, Rfl: 1 .  traMADol (ULTRAM) 50 MG tablet, Take 1 tablet by mouth up to twice daily as needed for pain., Disp: 30 tablet, Rfl: 0 .  triamcinolone cream (KENALOG) 0.1 %, Apply 1 application topically 2 (two) times daily. Apply for up to one week. (Patient taking differently: Apply 1 application topically 2 (two) times daily as needed (rash). ), Disp: 30 g, Rfl: 0 .  UNABLE TO FIND, Rx: L8000-Post Surgical Bra (Quantity: 6) J0932- Silicone Breast Prosthesis (Quantity: 2) Dx: 174.9; Bilateral partial mastectomy Replacement due to hot flashes since previous items were too heavy and hot, Disp: 1 each, Rfl: 0 .  venlafaxine XR (EFFEXOR-XR) 150 MG 24 hr capsule, TAKE 1 CAPSULE BY MOUTH SEE ADMIN INSTRUCTIONS. NIGHTLY WITH 75 MG CAPSULE, TOTAL OF 225 MG NIGHTLY, Disp: 90 capsule, Rfl: 3 .  venlafaxine XR (EFFEXOR-XR) 75 MG 24 hr capsule, TAKE 1 CAP DAILY IN COMBINATION WITH VENLAFAXINE XR 150 MG CAPSULE (TO EQUAL 225 MG TOTAL DAILY)., Disp: 90 capsule, Rfl: 0 .  vitamin E 200 UNIT capsule, Take 200 Units by mouth daily., Disp: , Rfl:  .  Erenumab-aooe (AIMOVIG) 140 MG/ML SOAJ, Inject 140 mg into the skin every 30 (thirty) days. (Patient not taking: Reported on 08/26/2019), Disp: 1 pen, Rfl: 11   Allergies  Allergen Reactions  . Aspirin     unknown  . Penicillins Hives and Swelling    Has patient had a PCN reaction causing immediate rash, facial/tongue/throat swelling, SOB or lightheadedness with  hypotension: Yes Has patient had a PCN reaction causing severe rash involving mucus membranes or skin necrosis: Yes Has patient had a PCN reaction that required hospitalization Yes Has patient had a PCN reaction occurring within the last 10 years: No If all of the above answers are "NO", then may proceed with Cephalosporin use.      Review of Systems  Constitutional: Negative for fatigue.  HENT: Negative for congestion.   Respiratory: Negative for cough and shortness of breath.   Endocrine:  Negative for polydipsia, polyphagia and polyuria.  Musculoskeletal: Negative for myalgias.  Neurological: Negative for dizziness and headaches.  Psychiatric/Behavioral: Negative.      Today's Vitals   08/26/19 1413  BP: 124/80  Pulse: 94  Temp: 98 F (36.7 C)  TempSrc: Oral  Weight: (!) 303 lb 3.2 oz (137.5 kg)  Height: 5' 5"  (1.651 m)  PainSc: 7   PainLoc: Foot   Body mass index is 50.46 kg/m.   Objective:  Physical Exam Vitals reviewed.  Constitutional:      Appearance: Normal appearance.  Cardiovascular:     Rate and Rhythm: Normal rate and regular rhythm.     Pulses: Normal pulses.     Heart sounds: Normal heart sounds. No murmur.  Neurological:     General: No focal deficit present.     Mental Status: She is alert and oriented to person, place, and time.     Cranial Nerves: No cranial nerve deficit.  Psychiatric:        Mood and Affect: Mood normal.        Behavior: Behavior normal.        Thought Content: Thought content normal.        Judgment: Judgment normal.         Assessment And Plan:     1. Lower extremity edema  Bilateral feet with 1+ non-pitting edema  Encouraged to wear support socks during the day  Will increase her HCTZ   2. Dyspnea, unspecified type  Intermittent and has used her rescue inhaler 2-3 times this week  No wheezing noted on exam  3. Pain in joints of both feet  Will check for inflammation and gout  This may be a fibromyalgia  flare as well - Uric acid - Sed Rate (ESR)    [name], FNP    THE PATIENT IS ENCOURAGED TO PRACTICE SOCIAL DISTANCING DUE TO THE COVID-19 PANDEMIC.

## 2019-08-27 ENCOUNTER — Other Ambulatory Visit: Payer: Self-pay

## 2019-08-27 ENCOUNTER — Encounter: Payer: Self-pay | Admitting: Nurse Practitioner

## 2019-08-27 DIAGNOSIS — M797 Fibromyalgia: Secondary | ICD-10-CM

## 2019-08-27 LAB — BRAIN NATRIURETIC PEPTIDE: BNP: 3.7 pg/mL (ref 0.0–100.0)

## 2019-08-27 LAB — SEDIMENTATION RATE: Sed Rate: 48 mm/hr — ABNORMAL HIGH (ref 0–40)

## 2019-08-27 LAB — URIC ACID: Uric Acid: 4.8 mg/dL (ref 3.0–7.2)

## 2019-08-27 MED ORDER — MELOXICAM 7.5 MG PO TABS: 7.5000 mg | ORAL_TABLET | Freq: Every day | ORAL | 1 refills | Status: DC

## 2019-08-27 MED ORDER — CYCLOBENZAPRINE HCL 10 MG PO TABS: ORAL_TABLET | ORAL | 1 refills | Status: DC

## 2019-08-28 ENCOUNTER — Other Ambulatory Visit: Payer: Self-pay | Admitting: Nurse Practitioner

## 2019-08-28 DIAGNOSIS — F329 Major depressive disorder, single episode, unspecified: Secondary | ICD-10-CM

## 2019-08-28 DIAGNOSIS — R519 Headache, unspecified: Secondary | ICD-10-CM

## 2019-08-28 DIAGNOSIS — G8929 Other chronic pain: Secondary | ICD-10-CM

## 2019-08-28 DIAGNOSIS — F32A Depression, unspecified: Secondary | ICD-10-CM

## 2019-08-31 ENCOUNTER — Encounter: Payer: Self-pay | Admitting: Orthopaedic Surgery

## 2019-09-01 ENCOUNTER — Other Ambulatory Visit: Payer: Self-pay

## 2019-09-01 ENCOUNTER — Encounter
Payer: Medicare Other | Attending: Physical Medicine and Rehabilitation | Admitting: Physical Medicine and Rehabilitation

## 2019-09-01 ENCOUNTER — Other Ambulatory Visit: Payer: Self-pay | Admitting: Hematology and Oncology

## 2019-09-01 ENCOUNTER — Encounter: Payer: Self-pay | Admitting: Physical Medicine and Rehabilitation

## 2019-09-01 ENCOUNTER — Ambulatory Visit (INDEPENDENT_AMBULATORY_CARE_PROVIDER_SITE_OTHER): Payer: Medicare Other | Admitting: Orthopaedic Surgery

## 2019-09-01 ENCOUNTER — Encounter: Payer: Self-pay | Admitting: Orthopaedic Surgery

## 2019-09-01 VITALS — BP 114/78 | HR 97 | Temp 97.2°F | Ht 62.0 in | Wt 304.0 lb

## 2019-09-01 VITALS — Ht 62.0 in | Wt 302.0 lb

## 2019-09-01 DIAGNOSIS — R519 Headache, unspecified: Secondary | ICD-10-CM | POA: Diagnosis not present

## 2019-09-01 DIAGNOSIS — E669 Obesity, unspecified: Secondary | ICD-10-CM

## 2019-09-01 DIAGNOSIS — M1711 Unilateral primary osteoarthritis, right knee: Secondary | ICD-10-CM | POA: Diagnosis not present

## 2019-09-01 DIAGNOSIS — G8929 Other chronic pain: Secondary | ICD-10-CM | POA: Diagnosis not present

## 2019-09-01 DIAGNOSIS — Z6841 Body Mass Index (BMI) 40.0 and over, adult: Secondary | ICD-10-CM

## 2019-09-01 DIAGNOSIS — M797 Fibromyalgia: Secondary | ICD-10-CM | POA: Diagnosis not present

## 2019-09-01 MED ORDER — GABAPENTIN 300 MG PO CAPS: 300.0000 mg | ORAL_CAPSULE | Freq: Three times a day (TID) | ORAL | 3 refills | Status: DC

## 2019-09-01 MED ORDER — METHYLPREDNISOLONE ACETATE 40 MG/ML IJ SUSP
80.0000 mg | INTRAMUSCULAR | Status: AC | PRN
  Administered 2019-09-01: 80 mg via INTRA_ARTICULAR

## 2019-09-01 MED ORDER — LIDOCAINE HCL 1 % IJ SOLN
2.0000 mL | INTRAMUSCULAR | Status: AC | PRN
  Administered 2019-09-01: 2 mL

## 2019-09-01 MED ORDER — BUPIVACAINE HCL 0.5 % IJ SOLN
2.0000 mL | INTRAMUSCULAR | Status: AC | PRN
  Administered 2019-09-01: 2 mL via INTRA_ARTICULAR

## 2019-09-01 NOTE — Progress Notes (Signed)
Office Visit Note   Patient: Ashley Lindsey           Date of Birth: [birth year]           MRN: [id number] Visit Date: 09/01/2019              Requested by: [name], [address] [place],  [place] [address] PCP: [name], FNP   Assessment & Plan: Visit Diagnoses:  1. Unilateral primary osteoarthritis, right knee   2. Class 3 severe obesity due to excess calories without serious comorbidity with body mass index (BMI) of 40.0 to 44.9 in adult [facility])     Plan: Advanced osteoarthritis right knee.  Also BMI of over 55.  Will reinject right knee with cortisone.  Had long discussion over 30 minutes regarding her weight and inability to perform total knee replacement.  She is seeing a nutritionist and is avoiding sweets and meats.  She is "dedicated" to losing weight.  We will monitor her response to the cortisone.  I am sure weight loss will greatly help her knee pain  Follow-Up Instructions: Return if symptoms worsen or fail to improve.   Orders:  Orders Placed This Encounter  Procedures  . Large Joint Inj: R knee   No orders of the defined types were placed in this encounter.     Procedures: Large Joint Inj: R knee on 09/01/2019 4:27 PM Indications: pain and diagnostic evaluation Details: 25 G 1.5 in needle, anteromedial approach  Arthrogram: No  Medications: 2 mL lidocaine 1 %; 2 mL bupivacaine 0.5 %; 80 mg methylPREDNISolone acetate 40 MG/ML Procedure, treatment alternatives, risks and benefits explained, specific risks discussed. Consent was given by the patient. Immediately prior to procedure a time out was called to verify the correct patient, procedure, equipment, support staff and site/side marked as required. Patient was prepped and draped in the usual sterile fashion.       Clinical Data: No additional findings.   Subjective: Chief Complaint  Patient presents with  . Right Knee - Pain  Patient presents today for recurrent chronic  right knee pain. She was here last in February and received a cortisone injection. She states that the injection helps for about two weeks. She is wanting to get another injection today. She has been waking at night due to pain. She is taking Tylenol Arthritis and states that it helps, but occasionally takes Flexeril as needed.   HPI  Review of Systems   Objective: Vital Signs: Ht 5\' 2"  (1.575 m)   Wt (!) 302 lb (137 kg)   BMI 55.24 kg/m   Physical Exam Constitutional:      Appearance: She is well-developed.  Eyes:     Pupils: Pupils are equal, round, and reactive to light.  Pulmonary:     Effort: Pulmonary effort is normal.  Skin:    General: Skin is warm and dry.  Neurological:     Mental Status: She is alert and oriented to person, place, and time.  Psychiatric:        Behavior: Behavior normal.     Ortho Exam large knees.  Very small effusion right knee.  Full extension about 90 degrees of flexion without instability.  Predominately medial joint pain today.  No calf pain or popliteal mass  Specialty Comments:  No specialty comments available.  Imaging: No results found.   PMFS History: Patient Active Problem List   Diagnosis Date Noted  . Chronic intermittent hypoxia with obstructive sleep  apnea 08/24/2019  . Chronic intractable headache 07/14/2019  . Morning headache 07/14/2019  . Loud snoring 07/14/2019  . Super obesity 07/14/2019  . Sleeps in sitting position due to orthopnea 07/14/2019  . Chest pain due to GERD 07/14/2019  . Nocturia more than twice per night 07/14/2019  . Unilateral primary osteoarthritis, right knee 05/26/2019  . Chronic migraine without aura without status migrainosus, not intractable 05/13/2019  . Class 3 severe obesity due to excess calories without serious comorbidity with body mass index (BMI) of 40.0 to 44.9 in adult ([place]) 08/05/2018  . Depression 08/05/2018  . Chronic nonintractable headache 08/05/2018  . Elevated blood-pressure  reading without diagnosis of hypertension 06/06/2018  . Chronic pain of right ankle 04/24/2018  . Breast cancer of lower-outer quadrant of right female breast ([place]) 12/02/2014  . Bronchospasm 09/21/2014  . Dyspnea 09/21/2014  . Chronic pain of right knee 09/21/2014  . Fibromyalgia 03/06/2013  . Dense breasts 03/06/2013  . Mastalgia 03/06/2013  . Back pain, lumbosacral 03/06/2013  . Lymphedema of arm 03/06/2013  . Atypical chest pain 01/06/2013  . Breast cancer of upper-inner quadrant of left female breast ([place]) 09/08/2012  . Family history of malignant neoplasm of breast 09/08/2012  . S/P radiation therapy   . History of breast cancer in female 08/13/2011   Past Medical History:  Diagnosis Date  . Abdominal cramps   . Arthritis   . Cancer [facility])    s/p radiation- last dose 6/12//has been off Tamoxifen 8/12  . Chronic headaches   . Costochondritis   . Fatigue   . Fibromyalgia 03/06/2013  . Lymphedema    RIGHT ARM-////  STATES USE LEFT ARM FOR BP'S  . Migraines   . Muscle spasms of head and/or neck    following to the breast  . Passed out    4th of july  . Personal history of radiation therapy 2010   lt breast   . S/P radiation therapy 08/19/07 - 10/10/07   Left Breast/5040 cGy/28 fractions with Boost for a toatl dose of 6300 dGy  . S/P radiation therapy 08/14/10 -10/03/10   right breast  . Sleep apnea    STOP BANG SCORE 5    Family History  Problem Relation Age of Onset  . Breast cancer Mother 67  . Heart disease Father   . Prostate cancer Maternal Uncle 78  . Cancer Maternal Aunt 63       d. from "female cancer" possibly cervical  . Breast cancer Maternal Aunt 60  . Prostate cancer Paternal Uncle   . Prostate cancer Maternal Grandfather 57  . Prostate cancer Maternal Uncle 80  . Prostate cancer Paternal Uncle   . Breast cancer Cousin 56       mat 1st cousin    Past Surgical History:  Procedure Laterality Date  . ABDOMINAL HYSTERECTOMY     with left  salpingooophorectomy  . BREAST LUMPECTOMY  2009/2012   LEFT/RIGHT with lymph node dissection  . KNEE ARTHROSCOPY     right  . OOPHORECTOMY  2013   Social History   Occupational History  . Occupation: disability  Tobacco Use  . Smoking status: Former Smoker    Packs/day: 0.25    Years: 1.00    Pack years: 0.25    Types: Cigarettes  . Smokeless tobacco: Never Used  Substance and Sexual Activity  . Alcohol use: Not Currently  . Drug use: No  . Sexual activity: Not Currently    Birth control/protection: Surgical

## 2019-09-01 NOTE — Progress Notes (Signed)
Subjective:    Patient ID: Ashley Lindsey, female    DOB: [birth year], 59 y.o.   MRN: [id number]  HPI Mrs. Abshire is a 59 year female with a history of breast cancer, fibromyalgia, lymphedema, right knee osteoarthritis.  She has a history of breast cancer, lymphedema, and her oncologist told her that the resection of her lymph nodes contributed to her fibromyalgia.  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  She is 304 lbs and is trying to lose weight.   She has been taking Tylenol-Arthritis which she says has saved her life. She also takes Meloxicam 7.5mg  as needed. She needs to have bilateral knee surgery but would like to lose more weight first. She is afraid if she does the surgery now she will gain more weight. She has tried Lyrica in the past and it aggravated her migraines.   She plans to go with daughter to the beach today with her daughter.  She does not want to go on a diet pill. She has tried intermittent fasting and does not eat until 6'o'clock. She has been tolerating this very well. She has been trying to avoid steroids as they make her hungry. She has a steroid injection by her orthopedist today into her knee.   She has been wearing good tennis shoes with memory foam.   She has started back in swimming in the pool.   Pain Inventory Average Pain 7 Pain Right Now 7 My pain is sharp, burning, dull, tingling and aching  In the last 24 hours, has pain interfered with the following? General activity 5 Relation with others 5 Enjoyment of life 5 What TIME of day is your pain at its worst? morning, night Sleep (in general) Good  Pain is worse with: walking, standing and some activites Pain improves with: rest and medication Relief from Meds: 5  Mobility walk with assistance use a cane how many minutes can you walk? 10 ability to climb steps?  no do you drive?  yes  Function disabled: date disabled  .  Neuro/Psych bladder control problems weakness numbness tingling trouble walking dizziness anxiety  Prior Studies Any changes since last visit?  no  Physicians involved in your care Any changes since last visit?  no   Family History  Problem Relation Age of Onset  . Breast cancer Mother 40  . Heart disease Father   . Prostate cancer Maternal Uncle 78  . Cancer Maternal Aunt 63       d. from "female cancer" possibly cervical  . Breast cancer Maternal Aunt 60  . Prostate cancer Paternal Uncle   . Prostate cancer Maternal Grandfather 50  . Prostate cancer Maternal Uncle 80  . Prostate cancer Paternal Uncle   . Breast cancer Cousin 56       mat 1st cousin   Social History   Socioeconomic History  . Marital status: Divorced    Spouse name: Not on file  . Number of children: Not on file  . Years of education: Not on file  . Highest education level: Not on file  Occupational History  . Occupation: disability  Tobacco Use  . Smoking status: Former Smoker    Packs/day: 0.25    Years: 1.00    Pack years: 0.25    Types: Cigarettes  . Smokeless tobacco: Never Used  Substance and Sexual Activity  . Alcohol use: Not Currently  . Drug use: No  .  Sexual activity: Not Currently    Birth control/protection: Surgical  Other Topics Concern  . Not on file  Social History Narrative   Right handed    Coffee daily, sometimes Ginger ale    Social Determinants of Health   Financial Resource Strain:   . Difficulty of Paying Living Expenses:   Food Insecurity: No Food Insecurity  . Worried About Charity fundraiser in the Last Year: Never true  . Ran Out of Food in the Last Year: Never true  Transportation Needs: No Transportation Needs  . Lack of Transportation (Medical): No  . Lack of Transportation (Non-Medical): No  Physical Activity: Sufficiently Active  . Days of Exercise per Week: 7 days  . Minutes of Exercise per Session: 30 min  Stress: Stress Concern Present   . Feeling of Stress : Very much  Social Connections:   . Frequency of Communication with Friends and Family:   . Frequency of Social Gatherings with Friends and Family:   . Attends Religious Services:   . Active Member of Clubs or Organizations:   . Attends Archivist Meetings:   Marland Kitchen Marital Status:    Past Surgical History:  Procedure Laterality Date  . ABDOMINAL HYSTERECTOMY     with left salpingooophorectomy  . BREAST LUMPECTOMY  2009/2012   LEFT/RIGHT with lymph node dissection  . KNEE ARTHROSCOPY     right  . OOPHORECTOMY  2013   Past Medical History:  Diagnosis Date  . Abdominal cramps   . Arthritis   . Cancer [facility])    s/p radiation- last dose 6/12//has been off Tamoxifen 8/12  . Chronic headaches   . Costochondritis   . Fatigue   . Fibromyalgia 03/06/2013  . Lymphedema    RIGHT ARM-////  STATES USE LEFT ARM FOR BP'S  . Migraines   . Muscle spasms of head and/or neck    following to the breast  . Passed out    4th of july  . Personal history of radiation therapy 2010   lt breast   . S/P radiation therapy 08/19/07 - 10/10/07   Left Breast/5040 cGy/28 fractions with Boost for a toatl dose of 6300 dGy  . S/P radiation therapy 08/14/10 -10/03/10   right breast  . Sleep apnea    STOP BANG SCORE 5   BP 114/78   Pulse 97   Temp (!) 97.2 F (36.2 C)   Ht 5\' 2"  (1.575 m)   Wt (!) 304 lb (137.9 kg)   SpO2 93%   BMI 55.60 kg/m   Opioid Risk Score:   Fall Risk Score:  `1  Depression screen PHQ 2/9  Depression screen [facility] 2/9 09/01/2019 06/15/2019 06/15/2019 04/07/2019 01/15/2019 01/15/2019 10/09/2018  Decreased Interest 2 0 0 0 0 0 0  Down, Depressed, Hopeless 0 0 0 0 0 0 2  PHQ - 2 Score 2 0 0 0 0 0 2  Altered sleeping 2 0 - - 1 - 2  Tired, decreased energy 2 0 - - 0 - 0  Change in appetite 3 0 - - 0 - 2  Feeling bad or failure about yourself  0 0 - - 0 - 0  Trouble concentrating 2 0 - - 0 - 0  Moving slowly or fidgety/restless 2 0 - - 0 - 0  Suicidal  thoughts 0 0 - - 0 - 0  PHQ-9 Score 13 0 - - 1 - 6  Difficult doing work/chores Somewhat difficult Not difficult at all - -  Not difficult at all - Not difficult at all  Some recent data might be hidden    Review of Systems  Constitutional: Positive for diaphoresis and unexpected weight change.  Respiratory: Positive for apnea, cough, shortness of breath and wheezing.   Gastrointestinal: Positive for nausea.  Endocrine:       High blood sugar  Genitourinary: Positive for difficulty urinating.  Musculoskeletal: Positive for arthralgias, back pain, gait problem, myalgias, neck pain and neck stiffness.  Skin: Positive for rash.  Allergic/Immunologic: Negative.   Neurological: Positive for dizziness, weakness and numbness.       Tingling  Psychiatric/Behavioral: The patient is nervous/anxious.   All other systems reviewed and are negative.      Objective:   Physical Exam Gen: no distress, normal appearing HEENT: oral mucosa pink and moist, NCAT Cardio: Reg rate Chest: normal effort, normal rate of breathing Abd: soft, non-distended Ext: no edema Skin: intact Neuro: Aox3 Musculoskeletal: Psych: pleasant, normal affect, talkative    Assessment & Plan:  Mrs. Sundstrom is a 59 year female with a history of breast cancer, fibromyalgia, lymphedema, right knee osteoarthritis.  Fibromyalgia: -Continue use of therapeutic bed. -Has great support system with daughter, grandchildren, her mother and uncle who she takes care of.  -Lyrica has caused migraines. She is currently on Gabapentin, prescribed by her oncologist.   R knee Osteoarthritis: -XRs reviewed and discussed with patient: shows bone on bone arthritis -Has steroid injection by orthopedist today.  -Is considering surgery in the future after she is able to lose more weight.   Morbid obesity -Commended on her efforts in weight loss and intermittent fasting. Will monitor weight each visit as inflammation really contributes to  diffuse pain.  Low back pain: -Continue lidocaine patches   Return to clinic in 1 month to assess progress with the above interventions. >60 minutes spent in face to face care with patient.

## 2019-09-12 ENCOUNTER — Other Ambulatory Visit: Payer: Self-pay | Admitting: Nurse Practitioner

## 2019-09-12 DIAGNOSIS — R110 Nausea: Secondary | ICD-10-CM

## 2019-09-13 NOTE — Telephone Encounter (Signed)
Phenergan refill

## 2019-09-14 ENCOUNTER — Encounter: Payer: Self-pay | Admitting: Orthopaedic Surgery

## 2019-09-14 NOTE — Telephone Encounter (Signed)
Ask her is she taking this on a regular basis?

## 2019-09-15 NOTE — Telephone Encounter (Signed)
She said that she takes it about twice a week.

## 2019-09-22 ENCOUNTER — Other Ambulatory Visit: Payer: Self-pay | Admitting: Nurse Practitioner

## 2019-09-22 NOTE — Telephone Encounter (Signed)
Flexeril: refill ° °

## 2019-09-29 ENCOUNTER — Encounter: Payer: Medicare Other | Admitting: Physical Medicine and Rehabilitation

## 2019-09-30 ENCOUNTER — Other Ambulatory Visit: Payer: Self-pay | Admitting: Physical Medicine and Rehabilitation

## 2019-09-30 ENCOUNTER — Encounter: Payer: Self-pay | Admitting: Adult Health

## 2019-09-30 MED ORDER — GABAPENTIN 300 MG PO CAPS: 300.0000 mg | ORAL_CAPSULE | Freq: Three times a day (TID) | ORAL | 3 refills | Status: DC

## 2019-09-30 NOTE — Telephone Encounter (Signed)
Received: Today Message Contents  Jacquelyne Balint, RN Pt was scheduled on 05/27 and no-showed. We have rescheduled her for 06/23.

## 2019-10-01 ENCOUNTER — Other Ambulatory Visit: Payer: Self-pay | Admitting: Physical Medicine and Rehabilitation

## 2019-10-01 MED ORDER — GABAPENTIN 300 MG PO CAPS: 300.0000 mg | ORAL_CAPSULE | Freq: Three times a day (TID) | ORAL | 3 refills | Status: DC

## 2019-10-01 NOTE — Telephone Encounter (Signed)
I called the pharmacy and they said she picked up a 90 day supply of the 300 mg gabapentin on 08/10/19 and so it can't be filled until 6/19.  Ashley Lindsey believed she was taking 100 mg gabapentin and her primary had said she could double up on her dose to make 300 mg tid, but I do not see where she has ever been prescribed 100 mg. It could be she was doubling up her dose of 300 mg thinking it was 100 mg capsules.  I am having her check her capsules in her med box, since she threw out the bottle, to see if it tells what she was taking. Otherwise she should not be out of that med until around 6/19 and pharmacy will not do early refill on anything controlled.

## 2019-10-07 ENCOUNTER — Encounter
Payer: Medicare Other | Attending: Physical Medicine and Rehabilitation | Admitting: Physical Medicine and Rehabilitation

## 2019-10-07 DIAGNOSIS — M1711 Unilateral primary osteoarthritis, right knee: Secondary | ICD-10-CM | POA: Insufficient documentation

## 2019-10-07 DIAGNOSIS — G8929 Other chronic pain: Secondary | ICD-10-CM | POA: Insufficient documentation

## 2019-10-07 DIAGNOSIS — R519 Headache, unspecified: Secondary | ICD-10-CM | POA: Insufficient documentation

## 2019-10-07 DIAGNOSIS — E669 Obesity, unspecified: Secondary | ICD-10-CM | POA: Insufficient documentation

## 2019-10-07 DIAGNOSIS — M797 Fibromyalgia: Secondary | ICD-10-CM | POA: Insufficient documentation

## 2019-10-07 NOTE — Progress Notes (Deleted)
Subjective:    Patient ID: Ashley Lindsey, female    DOB: December 21, 1960, 59 y.o.   MRN: [id number]  HPI  Pain Inventory Average Pain 7 Pain Right Now 7 My pain is sharp, burning, dull, tingling and aching  In the last 24 hours, has pain interfered with the following? General activity 5 Relation with others 5 Enjoyment of life 5 What TIME of day is your pain at its worst? morning and night Sleep (in general) Good  Pain is worse with: walking, standing and some activites Pain improves with: rest and medication Relief from Meds: 5  Mobility walk with assistance use a cane how many minutes can you walk? 10 ability to climb steps?  no do you drive?  yes  Function disabled: date disabled .  Neuro/Psych weakness numbness tingling trouble walking dizziness anxiety  Prior Studies Any changes since last visit?  no  Physicians involved in your care Any changes since last visit?  no   Family History  Problem Relation Age of Onset  . Breast cancer Mother 61  . Heart disease Father   . Prostate cancer Maternal Uncle 78  . Cancer Maternal Aunt 63       d. from "female cancer" possibly cervical  . Breast cancer Maternal Aunt 60  . Prostate cancer Paternal Uncle   . Prostate cancer Maternal Grandfather 65  . Prostate cancer Maternal Uncle 80  . Prostate cancer Paternal Uncle   . Breast cancer Cousin 48       mat 1st cousin   Social History   Socioeconomic History  . Marital status: Divorced    Spouse name: Not on file  . Number of children: Not on file  . Years of education: Not on file  . Highest education level: Not on file  Occupational History  . Occupation: disability  Tobacco Use  . Smoking status: Former Smoker    Packs/day: 0.25    Years: 1.00    Pack years: 0.25    Types: Cigarettes  . Smokeless tobacco: Never Used  Vaping Use  . Vaping Use: Never used  Substance and Sexual Activity  . Alcohol use: Not Currently  . Drug use: No  . Sexual  activity: Not Currently    Birth control/protection: Surgical  Other Topics Concern  . Not on file  Social History Narrative   Right handed    Coffee daily, sometimes Ginger ale    Social Determinants of Health   Financial Resource Strain:   . Difficulty of Paying Living Expenses:   Food Insecurity: No Food Insecurity  . Worried About Charity fundraiser in the Last Year: Never true  . Ran Out of Food in the Last Year: Never true  Transportation Needs: No Transportation Needs  . Lack of Transportation (Medical): No  . Lack of Transportation (Non-Medical): No  Physical Activity: Sufficiently Active  . Days of Exercise per Week: 7 days  . Minutes of Exercise per Session: 30 min  Stress: Stress Concern Present  . Feeling of Stress : Very much  Social Connections:   . Frequency of Communication with Friends and Family:   . Frequency of Social Gatherings with Friends and Family:   . Attends Religious Services:   . Active Member of Clubs or Organizations:   . Attends Archivist Meetings:   Marland Kitchen Marital Status:    Past Surgical History:  Procedure Laterality Date  . ABDOMINAL HYSTERECTOMY     with left salpingooophorectomy  . BREAST  LUMPECTOMY  2009/2012   LEFT/RIGHT with lymph node dissection  . KNEE ARTHROSCOPY     right  . OOPHORECTOMY  2013   Past Medical History:  Diagnosis Date  . Abdominal cramps   . Arthritis   . Cancer [facility])    s/p radiation- last dose 6/12//has been off Tamoxifen 8/12  . Chronic headaches   . Costochondritis   . Fatigue   . Fibromyalgia 03/06/2013  . Lymphedema    RIGHT ARM-////  STATES USE LEFT ARM FOR BP'S  . Migraines   . Muscle spasms of head and/or neck    following to the breast  . Passed out    4th of july  . Personal history of radiation therapy 2010   lt breast   . S/P radiation therapy 08/19/07 - 10/10/07   Left Breast/5040 cGy/28 fractions with Boost for a toatl dose of 6300 dGy  . S/P radiation therapy 08/14/10 -10/03/10    right breast  . Sleep apnea    STOP BANG SCORE 5   There were no vitals taken for this visit.  Opioid Risk Score:   Fall Risk Score:  `1  Depression screen PHQ 2/9  Depression screen [facility] 2/9 09/01/2019 06/15/2019 06/15/2019 04/07/2019 01/15/2019 01/15/2019 10/09/2018  Decreased Interest 2 0 0 0 0 0 0  Down, Depressed, Hopeless 0 0 0 0 0 0 2  PHQ - 2 Score 2 0 0 0 0 0 2  Altered sleeping 2 0 - - 1 - 2  Tired, decreased energy 2 0 - - 0 - 0  Change in appetite 3 0 - - 0 - 2  Feeling bad or failure about yourself  0 0 - - 0 - 0  Trouble concentrating 2 0 - - 0 - 0  Moving slowly or fidgety/restless 2 0 - - 0 - 0  Suicidal thoughts 0 0 - - 0 - 0  PHQ-9 Score 13 0 - - 1 - 6  Difficult doing work/chores Somewhat difficult Not difficult at all - - Not difficult at all - Not difficult at all  Some recent data might be hidden    Review of Systems  Musculoskeletal: Positive for gait problem.  Neurological: Positive for dizziness, weakness and numbness.       Tingling  Psychiatric/Behavioral: The patient is nervous/anxious.   All other systems reviewed and are negative.      Objective:   Physical Exam        Assessment & Plan:

## 2019-10-15 ENCOUNTER — Encounter: Payer: Medicare Other | Admitting: Nurse Practitioner

## 2019-10-15 ENCOUNTER — Ambulatory Visit: Payer: Medicare Other

## 2019-10-16 ENCOUNTER — Other Ambulatory Visit: Payer: Self-pay | Admitting: Nurse Practitioner

## 2019-10-16 DIAGNOSIS — R110 Nausea: Secondary | ICD-10-CM

## 2019-10-25 ENCOUNTER — Other Ambulatory Visit: Payer: Self-pay | Admitting: Hematology and Oncology

## 2019-10-28 ENCOUNTER — Encounter: Payer: Medicare Other | Admitting: Physical Medicine and Rehabilitation

## 2019-10-29 ENCOUNTER — Ambulatory Visit: Payer: Self-pay | Admitting: Adult Health

## 2019-11-01 ENCOUNTER — Other Ambulatory Visit: Payer: Self-pay | Admitting: Hematology and Oncology

## 2019-11-01 ENCOUNTER — Other Ambulatory Visit: Payer: Self-pay | Admitting: Nurse Practitioner

## 2019-11-02 ENCOUNTER — Other Ambulatory Visit: Payer: Self-pay | Admitting: Nurse Practitioner

## 2019-11-02 ENCOUNTER — Other Ambulatory Visit: Payer: Self-pay | Admitting: Hematology and Oncology

## 2019-11-02 DIAGNOSIS — R110 Nausea: Secondary | ICD-10-CM

## 2019-11-02 DIAGNOSIS — M797 Fibromyalgia: Secondary | ICD-10-CM

## 2019-11-02 DIAGNOSIS — R030 Elevated blood-pressure reading, without diagnosis of hypertension: Secondary | ICD-10-CM

## 2019-11-02 NOTE — Telephone Encounter (Signed)
Meloxicam refill 

## 2019-11-02 NOTE — Telephone Encounter (Signed)
Cyclobenzaprine refill 

## 2019-11-04 ENCOUNTER — Ambulatory Visit: Payer: Medicare Other | Admitting: Nurse Practitioner

## 2019-11-18 ENCOUNTER — Ambulatory Visit: Payer: Medicare Other | Admitting: Nurse Practitioner

## 2019-11-25 ENCOUNTER — Encounter
Payer: Medicare Other | Attending: Physical Medicine and Rehabilitation | Admitting: Physical Medicine and Rehabilitation

## 2019-11-25 DIAGNOSIS — M1711 Unilateral primary osteoarthritis, right knee: Secondary | ICD-10-CM | POA: Insufficient documentation

## 2019-11-25 DIAGNOSIS — R519 Headache, unspecified: Secondary | ICD-10-CM | POA: Insufficient documentation

## 2019-11-25 DIAGNOSIS — G8929 Other chronic pain: Secondary | ICD-10-CM | POA: Insufficient documentation

## 2019-11-25 DIAGNOSIS — E669 Obesity, unspecified: Secondary | ICD-10-CM | POA: Insufficient documentation

## 2019-11-25 DIAGNOSIS — M797 Fibromyalgia: Secondary | ICD-10-CM | POA: Insufficient documentation

## 2019-11-30 ENCOUNTER — Other Ambulatory Visit: Payer: Self-pay | Admitting: Hematology and Oncology

## 2019-11-30 ENCOUNTER — Encounter: Payer: Self-pay | Admitting: Adult Health

## 2019-11-30 ENCOUNTER — Encounter: Payer: Self-pay | Admitting: Neurology

## 2019-11-30 DIAGNOSIS — Z1231 Encounter for screening mammogram for malignant neoplasm of breast: Secondary | ICD-10-CM

## 2019-12-02 ENCOUNTER — Other Ambulatory Visit: Payer: Self-pay

## 2019-12-02 ENCOUNTER — Encounter: Payer: Self-pay | Admitting: Nurse Practitioner

## 2019-12-02 ENCOUNTER — Ambulatory Visit (INDEPENDENT_AMBULATORY_CARE_PROVIDER_SITE_OTHER): Payer: Medicare Other | Admitting: Nurse Practitioner

## 2019-12-02 ENCOUNTER — Ambulatory Visit (INDEPENDENT_AMBULATORY_CARE_PROVIDER_SITE_OTHER): Payer: Medicare Other

## 2019-12-02 VITALS — BP 122/78 | HR 95 | Temp 98.3°F | Ht 64.6 in | Wt 309.3 lb

## 2019-12-02 VITALS — BP 122/78 | HR 95 | Temp 98.3°F | Ht 64.6 in | Wt 309.2 lb

## 2019-12-02 DIAGNOSIS — M797 Fibromyalgia: Secondary | ICD-10-CM | POA: Diagnosis not present

## 2019-12-02 DIAGNOSIS — I10 Essential (primary) hypertension: Secondary | ICD-10-CM | POA: Diagnosis not present

## 2019-12-02 DIAGNOSIS — R110 Nausea: Secondary | ICD-10-CM

## 2019-12-02 DIAGNOSIS — E669 Obesity, unspecified: Secondary | ICD-10-CM

## 2019-12-02 DIAGNOSIS — J4520 Mild intermittent asthma, uncomplicated: Secondary | ICD-10-CM | POA: Diagnosis not present

## 2019-12-02 DIAGNOSIS — Z0000 Encounter for general adult medical examination without abnormal findings: Secondary | ICD-10-CM

## 2019-12-02 MED ORDER — PROMETHAZINE HCL 25 MG PO TABS: 25.0000 mg | ORAL_TABLET | Freq: Four times a day (QID) | ORAL | 0 refills | Status: DC | PRN

## 2019-12-02 MED ORDER — ORPHENADRINE CITRATE ER 100 MG PO TB12
100.0000 mg | ORAL_TABLET | Freq: Two times a day (BID) | ORAL | 1 refills | Status: DC
Start: 2019-12-02 — End: 2020-02-03

## 2019-12-02 MED ORDER — BUDESONIDE-FORMOTEROL FUMARATE 80-4.5 MCG/ACT IN AERO: 2.0000 | INHALATION_SPRAY | Freq: Two times a day (BID) | RESPIRATORY_TRACT | 1 refills | Status: DC

## 2019-12-02 NOTE — Progress Notes (Deleted)
This visit occurred during the SARS-CoV-2 public health emergency.  Safety protocols were in place, including screening questions prior to the visit, additional usage of staff PPE, and extensive cleaning of exam room while observing appropriate contact time as indicated for disinfecting solutions.  Subjective:     Patient ID: Ashley Lindsey , female    DOB: [birth year] , 59 y.o.   MRN: [id number]   No chief complaint on file.   HPI  HPI   Past Medical History:  Diagnosis Date  . Abdominal cramps   . Arthritis   . Cancer [facility])    s/p radiation- last dose 6/12//has been off Tamoxifen 8/12  . Chronic headaches   . Costochondritis   . Fatigue   . Fibromyalgia 03/06/2013  . Lymphedema    RIGHT ARM-////  STATES USE LEFT ARM FOR BP'S  . Migraines   . Muscle spasms of head and/or neck    following to the breast  . Passed out    4th of july  . Personal history of radiation therapy 2010   lt breast   . S/P radiation therapy 08/19/07 - 10/10/07   Left Breast/5040 cGy/28 fractions with Boost for a toatl dose of 6300 dGy  . S/P radiation therapy 08/14/10 -10/03/10   right breast  . Sleep apnea    STOP BANG SCORE 5     Family History  Problem Relation Age of Onset  . Breast cancer Mother 45  . Heart disease Father   . Prostate cancer Maternal Uncle 78  . Cancer Maternal Aunt 63       d. from "female cancer" possibly cervical  . Breast cancer Maternal Aunt 60  . Prostate cancer Paternal Uncle   . Prostate cancer Maternal Grandfather 52  . Prostate cancer Maternal Uncle 80  . Prostate cancer Paternal Uncle   . Breast cancer Cousin 29       mat 1st cousin     Current Outpatient Medications:  .  acetaminophen (TYLENOL) 650 MG CR tablet, Take 650 mg by mouth every 8 (eight) hours as needed for pain., Disp: , Rfl:  .  albuterol (VENTOLIN HFA) 108 (90 Base) MCG/ACT inhaler, INHALE 1-2 PUFFS INTO THE LUNGS EVERY 6 (SIX) HOURS AS NEEDED FOR WHEEZING OR SHORTNESS OF BREATH., Disp: 6.7  g, Rfl: 1 .  Bisacodyl (DULCOLAX PO), Take 1 capsule by mouth daily as needed (constipation). , Disp: , Rfl:  .  cholecalciferol 5000 units TABS, Take 1 tablet (5,000 Units total) by mouth 2 (two) times daily. (Patient taking differently: Take 5,000 Units by mouth daily. ), Disp: , Rfl:  .  cyclobenzaprine (FLEXERIL) 10 MG tablet, TAKE 1 TABLET BY MOUTH TWICE A DAY AS NEEDED FOR MUSCLE SPASMS, Disp: 30 tablet, Rfl: 1 .  DEXILANT 60 MG capsule, TAKE 1 CAPSULE BY MOUTH EVERY DAY, Disp: 90 capsule, Rfl: 1 .  diphenhydrAMINE (BENADRYL) 25 mg capsule, Take 25 mg by mouth 2 (two) times daily., Disp: , Rfl:  .  Erenumab-aooe (AIMOVIG) 140 MG/ML SOAJ, Inject 140 mg into the skin every 30 (thirty) days. (Patient not taking: Reported on 12/02/2019), Disp: 1 pen, Rfl: 11 .  fish oil-omega-3 fatty acids 1000 MG capsule, Take 1 g by mouth daily. , Disp: , Rfl:  .  Flaxseed, Linseed, (FLAXSEED OIL MAX STR PO), Take 1 capsule by mouth daily. , Disp: , Rfl:  .  gabapentin (NEURONTIN) 300 MG capsule, Take 1 capsule (300 mg total) by mouth 3 (three) times daily. Medication  may be dispensed to patient today. If you have questions, please call Dr. Ranell Patrick at [phone]., Disp: 90 capsule, Rfl: 3 .  hydrochlorothiazide (HYDRODIURIL) 25 MG tablet, Take 0.5 tablets (12.5 mg total) by mouth daily., Disp: 90 tablet, Rfl: 1 .  Iron-FA-B Cmp-C-Biot-Probiotic (FUSION PLUS) CAPS, Take 1 capsule by mouth daily., Disp: 30 capsule, Rfl: 3 .  meloxicam (MOBIC) 7.5 MG tablet, Take 1 tablet (7.5 mg total) by mouth daily., Disp: 30 tablet, Rfl: 1 .  Multiple Vitamins-Minerals (MULTIVITAMIN & MINERAL PO), Take 1 tablet by mouth daily., Disp: , Rfl:  .  omeprazole (PRILOSEC) 20 MG capsule, Take 20 mg by mouth daily., Disp: , Rfl:  .  promethazine (PHENERGAN) 25 MG tablet, TAKE 1 TABLET (25 MG TOTAL) BY MOUTH EVERY 6 (SIX) HOURS AS NEEDED FOR NAUSEA OR VOMITING., Disp: 30 tablet, Rfl: 0 .  propranolol (INDERAL) 20 MG tablet, TAKE 1  TABLET BY MOUTH TWICE A DAY, Disp: 180 tablet, Rfl: 0 .  rizatriptan (MAXALT-MLT) 10 MG disintegrating tablet, Take 1 tablet (10 mg total) by mouth as needed for migraine. May repeat in 2 hours if needed (Patient not taking: Reported on 12/02/2019), Disp: 9 tablet, Rfl: 11 .  senna (SENOKOT) 8.6 MG tablet, Take 1 tablet by mouth daily as needed for constipation. , Disp: , Rfl:  .  SYMBICORT 80-4.5 MCG/ACT inhaler, INHALE 2 PUFF BY INHALATION ROUTE EVERY DAY IN THE MORNING AND EVENING, Disp: 30.6 Inhaler, Rfl: 1 .  traMADol (ULTRAM) 50 MG tablet, Take 1 tablet by mouth up to twice daily as needed for pain. (Patient not taking: Reported on 12/02/2019), Disp: 30 tablet, Rfl: 0 .  triamcinolone cream (KENALOG) 0.1 %, Apply 1 application topically 2 (two) times daily. Apply for up to one week. (Patient taking differently: Apply 1 application topically 2 (two) times daily as needed (rash). ), Disp: 30 g, Rfl: 0 .  UNABLE TO FIND, Rx: L8000-Post Surgical Bra (Quantity: 6) U9811- Silicone Breast Prosthesis (Quantity: 2) Dx: 174.9; Bilateral partial mastectomy Replacement due to hot flashes since previous items were too heavy and hot, Disp: 1 each, Rfl: 0 .  venlafaxine XR (EFFEXOR-XR) 150 MG 24 hr capsule, TAKE 1 CAPSULE BY MOUTH SEE ADMIN INSTRUCTIONS. NIGHTLY WITH 75 MG CAPSULE, TOTAL OF 225 MG NIGHTLY, Disp: 90 capsule, Rfl: 3 .  venlafaxine XR (EFFEXOR-XR) 75 MG 24 hr capsule, TAKE 1 CAP DAILY IN COMBINATION WITH VENLAFAXINE XR 150 MG CAPSULE (TO EQUAL 225 MG TOTAL DAILY)., Disp: 90 capsule, Rfl: 0 .  vitamin E 200 UNIT capsule, Take 200 Units by mouth daily., Disp: , Rfl:    Allergies  Allergen Reactions  . Aspirin     unknown  . Penicillins Hives and Swelling    Has patient had a PCN reaction causing immediate rash, facial/tongue/throat swelling, SOB or lightheadedness with hypotension: Yes Has patient had a PCN reaction causing severe rash involving mucus membranes or skin necrosis: Yes Has patient  had a PCN reaction that required hospitalization Yes Has patient had a PCN reaction occurring within the last 10 years: No If all of the above answers are "NO", then may proceed with Cephalosporin use.      Review of Systems   There were no vitals filed for this visit. There is no height or weight on file to calculate BMI.   Objective:  Physical Exam      Assessment And Plan:     There are no diagnoses linked to this encounter.    Patient was  given opportunity to ask questions. Patient verbalized understanding of the plan and was able to repeat key elements of the plan. All questions were answered to their satisfaction.  [name], FNP   I, [name], FNP, have reviewed all documentation for this visit. The documentation on 12/02/19 for the exam, diagnosis, procedures, and orders are all accurate and complete.  THE PATIENT IS ENCOURAGED TO PRACTICE SOCIAL DISTANCING DUE TO THE COVID-19 PANDEMIC.

## 2019-12-02 NOTE — Progress Notes (Signed)
This visit occurred during the SARS-CoV-2 public health emergency.  Safety protocols were in place, including screening questions prior to the visit, additional usage of staff PPE, and extensive cleaning of exam room while observing appropriate contact time as indicated for disinfecting solutions.  Subjective:   Ashley Lindsey is a 59 y.o. female who presents for Medicare Annual (Subsequent) preventive examination.  Review of Systems     Cardiac Risk Factors include: obesity (BMI >30kg/m2);sedentary lifestyle     Objective:    Today's Vitals   12/02/19 1442 12/02/19 1443  BP: 122/78   Pulse: 95   Temp: 98.3 F (36.8 C)   TempSrc: Oral   Weight: (!) 309 lb 3.2 oz (140.3 kg)   Height: 5' 4.6" (1.641 m)   PainSc:  6    Body mass index is 52.09 kg/m.  Advanced Directives 12/02/2019 10/09/2018 05/06/2018 03/03/2018 10/21/2017 09/20/2017 09/15/2016  Does Patient Have a Medical Advance Directive? Yes No No No Yes No No  Type of Advance Directive Living will - - - [place] - -  Does patient want to make changes to medical advance directive? - - - - - - -  Copy of [place] in Chart? - - - - No - copy requested - -  Would patient like information on creating a medical advance directive? - - No - Patient declined - - - No - Patient declined  Pre-existing out of facility DNR order (yellow form or pink MOST form) - - - - - - -    Current Medications (verified) Outpatient Encounter Medications as of 12/02/2019  Medication Sig  . acetaminophen (TYLENOL) 650 MG CR tablet Take 650 mg by mouth every 8 (eight) hours as needed for pain.  Marland Kitchen albuterol (VENTOLIN HFA) 108 (90 Base) MCG/ACT inhaler INHALE 1-2 PUFFS INTO THE LUNGS EVERY 6 (SIX) HOURS AS NEEDED FOR WHEEZING OR SHORTNESS OF BREATH.  Marland Kitchen Bisacodyl (DULCOLAX PO) Take 1 capsule by mouth daily as needed (constipation).   . cholecalciferol 5000 units TABS Take 1 tablet (5,000 Units total) by mouth 2 (two)  times daily. (Patient taking differently: Take 5,000 Units by mouth daily. )  . cyclobenzaprine (FLEXERIL) 10 MG tablet TAKE 1 TABLET BY MOUTH TWICE A DAY AS NEEDED FOR MUSCLE SPASMS  . DEXILANT 60 MG capsule TAKE 1 CAPSULE BY MOUTH EVERY DAY  . diphenhydrAMINE (BENADRYL) 25 mg capsule Take 25 mg by mouth 2 (two) times daily.  . fish oil-omega-3 fatty acids 1000 MG capsule Take 1 g by mouth daily.   . Flaxseed, Linseed, (FLAXSEED OIL MAX STR PO) Take 1 capsule by mouth daily.   Marland Kitchen gabapentin (NEURONTIN) 300 MG capsule Take 1 capsule (300 mg total) by mouth 3 (three) times daily. Medication may be dispensed to patient today. If you have questions, please call Dr. Ranell Patrick at [phone].  . hydrochlorothiazide (HYDRODIURIL) 25 MG tablet Take 0.5 tablets (12.5 mg total) by mouth daily.  . Iron-FA-B Cmp-C-Biot-Probiotic (FUSION PLUS) CAPS Take 1 capsule by mouth daily.  . meloxicam (MOBIC) 7.5 MG tablet Take 1 tablet (7.5 mg total) by mouth daily.  . Multiple Vitamins-Minerals (MULTIVITAMIN & MINERAL PO) Take 1 tablet by mouth daily.  Marland Kitchen omeprazole (PRILOSEC) 20 MG capsule Take 20 mg by mouth daily.  . propranolol (INDERAL) 20 MG tablet TAKE 1 TABLET BY MOUTH TWICE A DAY  . senna (SENOKOT) 8.6 MG tablet Take 1 tablet by mouth daily as needed for constipation.   . triamcinolone cream (  KENALOG) 0.1 % Apply 1 application topically 2 (two) times daily. Apply for up to one week. (Patient taking differently: Apply 1 application topically 2 (two) times daily as needed (rash). )  . UNABLE TO FIND Rx: L8000-Post Surgical Bra (Quantity: 6) Z6629- Silicone Breast Prosthesis (Quantity: 2) Dx: 174.9; Bilateral partial mastectomy Replacement due to hot flashes since previous items were too heavy and hot  . venlafaxine XR (EFFEXOR-XR) 150 MG 24 hr capsule TAKE 1 CAPSULE BY MOUTH SEE ADMIN INSTRUCTIONS. NIGHTLY WITH 75 MG CAPSULE, TOTAL OF 225 MG NIGHTLY  . venlafaxine XR (EFFEXOR-XR) 75 MG 24 hr capsule TAKE 1 CAP  DAILY IN COMBINATION WITH VENLAFAXINE XR 150 MG CAPSULE (TO EQUAL 225 MG TOTAL DAILY).  Marland Kitchen vitamin E 200 UNIT capsule Take 200 Units by mouth daily.  . [DISCONTINUED] promethazine (PHENERGAN) 25 MG tablet TAKE 1 TABLET (25 MG TOTAL) BY MOUTH EVERY 6 (SIX) HOURS AS NEEDED FOR NAUSEA OR VOMITING.  . [DISCONTINUED] SYMBICORT 80-4.5 MCG/ACT inhaler INHALE 2 PUFF BY INHALATION ROUTE EVERY DAY IN THE MORNING AND EVENING  . Erenumab-aooe (AIMOVIG) 140 MG/ML SOAJ Inject 140 mg into the skin every 30 (thirty) days. (Patient not taking: Reported on 12/02/2019)  . rizatriptan (MAXALT-MLT) 10 MG disintegrating tablet Take 1 tablet (10 mg total) by mouth as needed for migraine. May repeat in 2 hours if needed (Patient not taking: Reported on 12/02/2019)  . traMADol (ULTRAM) 50 MG tablet Take 1 tablet by mouth up to twice daily as needed for pain. (Patient not taking: Reported on 12/02/2019)   No facility-administered encounter medications on file as of 12/02/2019.    Allergies (verified) Aspirin and Penicillins   History: Past Medical History:  Diagnosis Date  . Abdominal cramps   . Arthritis   . Cancer [facility])    s/p radiation- last dose 6/12//has been off Tamoxifen 8/12  . Chronic headaches   . Costochondritis   . Fatigue   . Fibromyalgia 03/06/2013  . Lymphedema    RIGHT ARM-////  STATES USE LEFT ARM FOR BP'S  . Migraines   . Muscle spasms of head and/or neck    following to the breast  . Passed out    4th of july  . Personal history of radiation therapy 2010   lt breast   . S/P radiation therapy 08/19/07 - 10/10/07   Left Breast/5040 cGy/28 fractions with Boost for a toatl dose of 6300 dGy  . S/P radiation therapy 08/14/10 -10/03/10   right breast  . Sleep apnea    STOP BANG SCORE 5   Past Surgical History:  Procedure Laterality Date  . ABDOMINAL HYSTERECTOMY     with left salpingooophorectomy  . BREAST LUMPECTOMY  2009/2012   LEFT/RIGHT with lymph node dissection  . KNEE ARTHROSCOPY      right  . OOPHORECTOMY  2013   Family History  Problem Relation Age of Onset  . Breast cancer Mother 53  . Heart disease Father   . Prostate cancer Maternal Uncle 78  . Cancer Maternal Aunt 63       d. from "female cancer" possibly cervical  . Breast cancer Maternal Aunt 60  . Prostate cancer Paternal Uncle   . Prostate cancer Maternal Grandfather 11  . Prostate cancer Maternal Uncle 80  . Prostate cancer Paternal Uncle   . Breast cancer Cousin 3       mat 1st cousin   Social History   Socioeconomic History  . Marital status: Divorced    Spouse name:  Not on file  . Number of children: Not on file  . Years of education: Not on file  . Highest education level: Not on file  Occupational History  . Occupation: disability  Tobacco Use  . Smoking status: Former Smoker    Packs/day: 0.25    Years: 1.00    Pack years: 0.25    Types: Cigarettes  . Smokeless tobacco: Never Used  Vaping Use  . Vaping Use: Never used  Substance and Sexual Activity  . Alcohol use: Not Currently  . Drug use: No  . Sexual activity: Not Currently    Birth control/protection: Surgical  Other Topics Concern  . Not on file  Social History Narrative   Right handed    Coffee daily, sometimes Ginger ale    Social Determinants of Health   Financial Resource Strain: Medium Risk  . Difficulty of Paying Living Expenses: Somewhat hard  Food Insecurity: No Food Insecurity  . Worried About Charity fundraiser in the Last Year: Never true  . Ran Out of Food in the Last Year: Never true  Transportation Needs: No Transportation Needs  . Lack of Transportation (Medical): No  . Lack of Transportation (Non-Medical): No  Physical Activity: Inactive  . Days of Exercise per Week: 0 days  . Minutes of Exercise per Session: 0 min  Stress: No Stress Concern Present  . Feeling of Stress : Not at all  Social Connections:   . Frequency of Communication with Friends and Family:   . Frequency of Social Gatherings  with Friends and Family:   . Attends Religious Services:   . Active Member of Clubs or Organizations:   . Attends Archivist Meetings:   Marland Kitchen Marital Status:     Tobacco Counseling Counseling given: Not Answered   Clinical Intake:  Pre-visit preparation completed: Yes  Pain : 0-10 Pain Score: 6  Pain Type: Chronic pain Pain Location: Leg Pain Orientation: Right, Left Pain Radiating Towards: lower back Pain Descriptors / Indicators: Aching Pain Onset: More than a month ago Pain Frequency: Constant     Nutritional Status: BMI > 30  Obese Nutritional Risks: Nausea/ vomitting/ diarrhea (some nausea, due to taking medicine) Diabetes: No  How often do you need to have someone help you when you read instructions, pamphlets, or other written materials from your doctor or pharmacy?: 1 - Never What is the last grade level you completed in school?: 12th grade  Diabetic? no  Interpreter Needed?: No  Information entered by :: NAllen LPN   Activities of Daily Living In your present state of health, do you have any difficulty performing the following activities: 12/02/2019  Hearing? Y  Vision? N  Difficulty concentrating or making decisions? Y  Comment writes every thing down  Walking or climbing stairs? Y  Dressing or bathing? N  Doing errands, shopping? N  Preparing Food and eating ? N  Using the Toilet? N  In the past six months, have you accidently leaked urine? Y  Comment wears a pad  Do you have problems with loss of bowel control? N  Managing your Medications? N  Managing your Finances? N  Housekeeping or managing your Housekeeping? N  Some recent data might be hidden    Patient Care Team: [name], FNP as PCP - General (General Practice)  Indicate any recent Medical Services you may have received from other than [facility] providers in the past year (date may be approximate).     Assessment:   This  is a routine wellness examination for  Ashley Lindsey.  Hearing/Vision screen  Hearing Screening   125Hz  250Hz  500Hz  1000Hz  2000Hz  3000Hz  4000Hz  6000Hz  8000Hz   Right ear:           Left ear:           Vision Screening Comments: Regular eye exams, Dr. Hamilton Capri  Dietary issues and exercise activities discussed: Current Exercise Habits: The patient does not participate in regular exercise at present  Goals    . Exercise 150 min/wk Moderate Activity     To find exercise to do in the house    . Patient Stated     12/02/2019, to get more energy      Depression Screen PHQ 2/9 Scores 12/02/2019 09/01/2019 06/15/2019 06/15/2019 04/07/2019 01/15/2019 01/15/2019  PHQ - 2 Score 2 2 0 0 0 0 0  PHQ- 9 Score 10 13 0 - - 1 -    Fall Risk Fall Risk  12/02/2019 09/01/2019 06/15/2019 04/07/2019 01/15/2019  Falls in the past year? 1 1 1  0 0  Comment missed a step - - - -  Number falls in past yr: 0 1 0 - -  Injury with Fall? 0 0 0 - -  Risk for fall due to : Medication side effect - - - -  Follow up Falls evaluation completed;Education provided;Falls prevention discussed - - - -    Any stairs in or around the home? Yes  If so, are there any without handrails? No  Home free of loose throw rugs in walkways, pet beds, electrical cords, etc? Yes  Adequate lighting in your home to reduce risk of falls? Yes   ASSISTIVE DEVICES UTILIZED TO PREVENT FALLS:  Life alert? Yes  Use of a cane, walker or w/c? No  Grab bars in the bathroom? Yes  Shower chair or bench in shower? No  Elevated toilet seat or a handicapped toilet? No   TIMED UP AND GO:  Was the test performed? No .    Gait slow and steady without use of assistive device  Cognitive Function:     6CIT Screen 12/02/2019 10/09/2018  What Year? 0 points 0 points  What month? 0 points 0 points  What time? 0 points 0 points  Count back from 20 0 points 0 points  Months in reverse 0 points 2 points  Repeat phrase 4 points 0 points  Total Score 4 2    Immunizations Immunization History   Administered Date(s) Administered  . Influenza Split 04/21/2012  . Influenza,inj,Quad PF,6+ Mos 05/19/2013, 05/16/2015, 01/26/2016, 01/20/2019  . Influenza-Unspecified 02/24/2018  . PFIZER SARS-COV-2 Vaccination 07/23/2019, 08/17/2019  . Tdap 11/05/2012    TDAP status: Up to date Flu Vaccine status: Up to date Pneumococcal vaccine status: Up to date Covid-19 vaccine status: Completed vaccines  Qualifies for Shingles Vaccine? Yes   Zostavax completed Yes   Shingrix Completed?: No.    Education has been provided regarding the importance of this vaccine. Patient has been advised to call insurance company to determine out of pocket expense if they have not yet received this vaccine. Advised may also receive vaccine at local pharmacy or Health Dept. Verbalized acceptance and understanding.  Screening Tests Health Maintenance  Topic Date Due  . INFLUENZA VACCINE  11/22/2019  . MAMMOGRAM  12/20/2019  . TETANUS/TDAP  11/06/2022  . COLONOSCOPY  05/22/2025  . COVID-19 Vaccine  Completed  . Hepatitis C Screening  Completed  . HIV Screening  Completed  . PAP SMEAR-Modifier  Discontinued  Health Maintenance  Health Maintenance Due  Topic Date Due  . INFLUENZA VACCINE  11/22/2019    Colorectal cancer screening: Completed 05/23/2015. Repeat every 10 years Mammogram status: scheduled for the 25th Bone Density status: Completed 03/23/2011.  Lung Cancer Screening: (Low Dose CT Chest recommended if Age 105-80 years, 30 pack-year currently smoking OR have quit w/in 15years.) does not qualify.   Lung Cancer Screening Referral: no   Additional Screening:  Hepatitis C Screening: does qualify; Completed 10/09/2018  Vision Screening: Recommended annual ophthalmology exams for early detection of glaucoma and other disorders of the eye. Is the patient up to date with their annual eye exam?  Yes  Who is the provider or what is the name of the office in which the patient attends annual eye  exams? Dr. Doristine Church If pt is not established with a provider, would they like to be referred to a provider to establish care? No .   Dental Screening: Recommended annual dental exams for proper oral hygiene  Community Resource Referral / Chronic Care Management: CRR required this visit?  Yes   CCM required this visit?  No      Plan:     I have personally reviewed and noted the following in the patient's chart:   . Medical and social history . Use of alcohol, tobacco or illicit drugs  . Current medications and supplements . Functional ability and status . Nutritional status . Physical activity . Advanced directives . List of other physicians . Hospitalizations, surgeries, and ER visits in previous 12 months . Vitals . Screenings to include cognitive, depression, and falls . Referrals and appointments  In addition, I have reviewed and discussed with patient certain preventive protocols, quality metrics, and best practice recommendations. A written personalized care plan for preventive services as well as general preventive health recommendations were provided to patient.     Kellie Simmering, LPN   [id number]   Nurse Notes:

## 2019-12-02 NOTE — Patient Instructions (Signed)
[name] , Thank you for taking time to come for your Medicare Wellness Visit. I appreciate your ongoing commitment to your health goals. Please review the following plan we discussed and let me know if I can assist you in the future.   Screening recommendations/referrals: Colonoscopy: completed 05/23/2015 Mammogram: scheduled for 12/16/2019 Bone Density: completed 03/23/2011 Recommended yearly ophthalmology/optometry visit for glaucoma screening and checkup Recommended yearly dental visit for hygiene and checkup  Vaccinations: Influenza vaccine: due Pneumococcal vaccine: completed 11/05/2012 Tdap vaccine: completed 11/05/2012 Shingles vaccine: discussed  Covid-19: 08/17/2019, 07/23/2019  Advanced directives: Please bring a copy of your POA (Power of Attorney) and/or Living Will to your next appointment.   Conditions/risks identified: none  Next appointment: 02/03/2020 at 11:00 Follow up in one year for your annual wellness visit.   Preventive Care 40-64 Years, Female Preventive care refers to lifestyle choices and visits with your health care provider that can promote health and wellness. What does preventive care include?  A yearly physical exam. This is also called an annual well check.  Dental exams once or twice a year.  Routine eye exams. Ask your health care provider how often you should have your eyes checked.  Personal lifestyle choices, including:  Daily care of your teeth and gums.  Regular physical activity.  Eating a healthy diet.  Avoiding tobacco and drug use.  Limiting alcohol use.  Practicing safe sex.  Taking low-dose aspirin daily starting at age 69.  Taking vitamin and mineral supplements as recommended by your health care provider. What happens during an annual well check? The services and screenings done by your health care provider during your annual well check will depend on your age, overall health, lifestyle risk factors, and family history of  disease. Counseling  Your health care provider may ask you questions about your:  Alcohol use.  Tobacco use.  Drug use.  Emotional well-being.  Home and relationship well-being.  Sexual activity.  Eating habits.  Work and work Statistician.  Method of birth control.  Menstrual cycle.  Pregnancy history. Screening  You may have the following tests or measurements:  Height, weight, and BMI.  Blood pressure.  Lipid and cholesterol levels. These may be checked every 5 years, or more frequently if you are over 66 years old.  Skin check.  Lung cancer screening. You may have this screening every year starting at age 11 if you have a 30-pack-year history of smoking and currently smoke or have quit within the past 15 years.  Fecal occult blood test (FOBT) of the stool. You may have this test every year starting at age 54.  Flexible sigmoidoscopy or colonoscopy. You may have a sigmoidoscopy every 5 years or a colonoscopy every 10 years starting at age 48.  Hepatitis C blood test.  Hepatitis B blood test.  Sexually transmitted disease (STD) testing.  Diabetes screening. This is done by checking your blood sugar (glucose) after you have not eaten for a while (fasting). You may have this done every 1-3 years.  Mammogram. This may be done every 1-2 years. Talk to your health care provider about when you should start having regular mammograms. This may depend on whether you have a family history of breast cancer.  BRCA-related cancer screening. This may be done if you have a family history of breast, ovarian, tubal, or peritoneal cancers.  Pelvic exam and Pap test. This may be done every 3 years starting at age 14. Starting at age 31, this may be done every  5 years if you have a Pap test in combination with an HPV test.  Bone density scan. This is done to screen for osteoporosis. You may have this scan if you are at high risk for osteoporosis. Discuss your test results,  treatment options, and if necessary, the need for more tests with your health care provider. Vaccines  Your health care provider may recommend certain vaccines, such as:  Influenza vaccine. This is recommended every year.  Tetanus, diphtheria, and acellular pertussis (Tdap, Td) vaccine. You may need a Td booster every 10 years.  Zoster vaccine. You may need this after age 43.  Pneumococcal 13-valent conjugate (PCV13) vaccine. You may need this if you have certain conditions and were not previously vaccinated.  Pneumococcal polysaccharide (PPSV23) vaccine. You may need one or two doses if you smoke cigarettes or if you have certain conditions. Talk to your health care provider about which screenings and vaccines you need and how often you need them. This information is not intended to replace advice given to you by your health care provider. Make sure you discuss any questions you have with your health care provider. Document Released: 05/06/2015 Document Revised: 12/28/2015 Document Reviewed: 02/08/2015 Elsevier Interactive Patient Education  2017 [employer] Prevention in the Home Falls can cause injuries. They can happen to people of all ages. There are many things you can do to make your home safe and to help prevent falls. What can I do on the outside of my home?  Regularly fix the edges of walkways and driveways and fix any cracks.  Remove anything that might make you trip as you walk through a door, such as a raised step or threshold.  Trim any bushes or trees on the path to your home.  Use bright outdoor lighting.  Clear any walking paths of anything that might make someone trip, such as rocks or tools.  Regularly check to see if handrails are loose or broken. Make sure that both sides of any steps have handrails.  Any raised decks and porches should have guardrails on the edges.  Have any leaves, snow, or ice cleared regularly.  Use sand or salt on walking  paths during winter.  Clean up any spills in your garage right away. This includes oil or grease spills. What can I do in the bathroom?  Use night lights.  Install grab bars by the toilet and in the tub and shower. Do not use towel bars as grab bars.  Use non-skid mats or decals in the tub or shower.  If you need to sit down in the shower, use a plastic, non-slip stool.  Keep the floor dry. Clean up any water that spills on the floor as soon as it happens.  Remove soap buildup in the tub or shower regularly.  Attach bath mats securely with double-sided non-slip rug tape.  Do not have throw rugs and other things on the floor that can make you trip. What can I do in the bedroom?  Use night lights.  Make sure that you have a light by your bed that is easy to reach.  Do not use any sheets or blankets that are too big for your bed. They should not hang down onto the floor.  Have a firm chair that has side arms. You can use this for support while you get dressed.  Do not have throw rugs and other things on the floor that can make you trip. What can I  do in the kitchen?  Clean up any spills right away.  Avoid walking on wet floors.  Keep items that you use a lot in easy-to-reach places.  If you need to reach something above you, use a strong step stool that has a grab bar.  Keep electrical cords out of the way.  Do not use floor polish or wax that makes floors slippery. If you must use wax, use non-skid floor wax.  Do not have throw rugs and other things on the floor that can make you trip. What can I do with my stairs?  Do not leave any items on the stairs.  Make sure that there are handrails on both sides of the stairs and use them. Fix handrails that are broken or loose. Make sure that handrails are as long as the stairways.  Check any carpeting to make sure that it is firmly attached to the stairs. Fix any carpet that is loose or worn.  Avoid having throw rugs at  the top or bottom of the stairs. If you do have throw rugs, attach them to the floor with carpet tape.  Make sure that you have a light switch at the top of the stairs and the bottom of the stairs. If you do not have them, ask someone to add them for you. What else can I do to help prevent falls?  Wear shoes that:  Do not have high heels.  Have rubber bottoms.  Are comfortable and fit you well.  Are closed at the toe. Do not wear sandals.  If you use a stepladder:  Make sure that it is fully opened. Do not climb a closed stepladder.  Make sure that both sides of the stepladder are locked into place.  Ask someone to hold it for you, if possible.  Clearly mark and make sure that you can see:  Any grab bars or handrails.  First and last steps.  Where the edge of each step is.  Use tools that help you move around (mobility aids) if they are needed. These include:  Canes.  Walkers.  Scooters.  Crutches.  Turn on the lights when you go into a dark area. Replace any light bulbs as soon as they burn out.  Set up your furniture so you have a clear path. Avoid moving your furniture around.  If any of your floors are uneven, fix them.  If there are any pets around you, be aware of where they are.  Review your medicines with your doctor. Some medicines can make you feel dizzy. This can increase your chance of falling. Ask your doctor what other things that you can do to help prevent falls. This information is not intended to replace advice given to you by your health care provider. Make sure you discuss any questions you have with your health care provider. Document Released: 02/03/2009 Document Revised: 09/15/2015 Document Reviewed: 05/14/2014 Elsevier Interactive Patient Education  2017 [employer].

## 2019-12-02 NOTE — Progress Notes (Signed)
I,[name] Lebron,acting as a scribe for Janece Moore, FNP.,have documented all relevant documentation on the behalf of Janece Moore, FNP,as directed by  Janece Moore, FNP while in the presence of Janece Moore, FNP.  This visit occurred during the SARS-CoV-2 public health emergency. Safety protocols were in place, including screening questions prior to the visit, additional usage of staff PPE, and extensive cleaning of exam room while observing appropriate contact time as indicated for disinfecting solutions.  Subjective:     Patient ID: Ashley Lindsey , female    DOB: 10/31/1960 , 58 y.o.   MRN: [id number]   Chief Complaint  Patient presents with  . Hypertension    HPI   She reports she has had her sleep study and they recommend she use her cpap 4 hours before bedtime.   Wt Readings from Last 3 Encounters: 12/02/19 : (!) 309 lb 4.9 oz (140.3 kg) 12/02/19 : (!) 309 lb 3.2 oz (140.3 kg) 09/01/19 : (!) 302 lb (137 kg)  She is having back pain after standing for long periods and having exertional dyspnea.   She reports she is not eating as much white processed foods.   Hypertension This is a chronic problem. The current episode started more than 1 year ago. The problem has been gradually improving since onset. The problem is controlled. Pertinent negatives include no anxiety, chest pain, headaches or palpitations. Risk factors for coronary artery disease include sedentary lifestyle and obesity. There are no compliance problems.  There is no history of chronic renal disease.     Past Medical History:  Diagnosis Date  . Abdominal cramps   . Arthritis   . Cancer (HCC)    s/p radiation- last dose 6/12//has been off Tamoxifen 8/12  . Chronic headaches   . Costochondritis   . Fatigue   . Fibromyalgia 03/06/2013  . Lymphedema    RIGHT ARM-////  STATES USE LEFT ARM FOR BP'S  . Migraines   . Muscle spasms of head and/or neck    following to the breast  . Passed out    4th of  july  . Personal history of radiation therapy 2010   lt breast   . S/P radiation therapy 08/19/07 - 10/10/07   Left Breast/5040 cGy/28 fractions with Boost for a toatl dose of 6300 dGy  . S/P radiation therapy 08/14/10 -10/03/10   right breast  . Sleep apnea    STOP BANG SCORE 5     Family History  Problem Relation Age of Onset  . Breast cancer Mother 62  . Heart disease Father   . Prostate cancer Maternal Uncle 78  . Cancer Maternal Aunt 63       d. from "female cancer" possibly cervical  . Breast cancer Maternal Aunt 60  . Prostate cancer Paternal Uncle   . Prostate cancer Maternal Grandfather 85  . Prostate cancer Maternal Uncle 80  . Prostate cancer Paternal Uncle   . Breast cancer Cousin 30       mat 1st cousin     Current Outpatient Medications:  .  acetaminophen (TYLENOL) 650 MG CR tablet, Take 650 mg by mouth every 8 (eight) hours as needed for pain., Disp: , Rfl:  .  albuterol (VENTOLIN HFA) 108 (90 Base) MCG/ACT inhaler, INHALE 1-2 PUFFS INTO THE LUNGS EVERY 6 (SIX) HOURS AS NEEDED FOR WHEEZING OR SHORTNESS OF BREATH., Disp: 6.7 g, Rfl: 1 .  Bisacodyl (DULCOLAX PO), Take 1 capsule by mouth daily as needed (constipation). , Disp: ,   Rfl:  .  budesonide-formoterol (SYMBICORT) 80-4.5 MCG/ACT inhaler, Inhale 2 puffs into the lungs in the morning and at bedtime., Disp: 30.6 g, Rfl: 1 .  cholecalciferol 5000 units TABS, Take 1 tablet (5,000 Units total) by mouth 2 (two) times daily. (Patient taking differently: Take 5,000 Units by mouth daily. ), Disp: , Rfl:  .  cyclobenzaprine (FLEXERIL) 10 MG tablet, TAKE 1 TABLET BY MOUTH TWICE A DAY AS NEEDED FOR MUSCLE SPASMS, Disp: 30 tablet, Rfl: 1 .  DEXILANT 60 MG capsule, TAKE 1 CAPSULE BY MOUTH EVERY DAY, Disp: 90 capsule, Rfl: 1 .  diphenhydrAMINE (BENADRYL) 25 mg capsule, Take 25 mg by mouth 2 (two) times daily., Disp: , Rfl:  .  Erenumab-aooe (AIMOVIG) 140 MG/ML SOAJ, Inject 140 mg into the skin every 30 (thirty) days. (Patient not  taking: Reported on 12/02/2019), Disp: 1 pen, Rfl: 11 .  fish oil-omega-3 fatty acids 1000 MG capsule, Take 1 g by mouth daily. , Disp: , Rfl:  .  Flaxseed, Linseed, (FLAXSEED OIL MAX STR PO), Take 1 capsule by mouth daily. , Disp: , Rfl:  .  gabapentin (NEURONTIN) 300 MG capsule, Take 1 capsule (300 mg total) by mouth 3 (three) times daily. Medication may be dispensed to patient today. If you have questions, please call Dr. Raulkar at [phone]., Disp: 90 capsule, Rfl: 3 .  hydrochlorothiazide (HYDRODIURIL) 25 MG tablet, Take 0.5 tablets (12.5 mg total) by mouth daily., Disp: 90 tablet, Rfl: 1 .  Iron-FA-B Cmp-C-Biot-Probiotic (FUSION PLUS) CAPS, Take 1 capsule by mouth daily., Disp: 30 capsule, Rfl: 3 .  meloxicam (MOBIC) 7.5 MG tablet, Take 1 tablet (7.5 mg total) by mouth daily., Disp: 30 tablet, Rfl: 1 .  Multiple Vitamins-Minerals (MULTIVITAMIN & MINERAL PO), Take 1 tablet by mouth daily., Disp: , Rfl:  .  omeprazole (PRILOSEC) 20 MG capsule, Take 20 mg by mouth daily., Disp: , Rfl:  .  orphenadrine (NORFLEX) 100 MG tablet, Take 1 tablet (100 mg total) by mouth 2 (two) times daily., Disp: 60 tablet, Rfl: 1 .  promethazine (PHENERGAN) 25 MG tablet, Take 1 tablet (25 mg total) by mouth every 6 (six) hours as needed for nausea or vomiting., Disp: 30 tablet, Rfl: 0 .  propranolol (INDERAL) 20 MG tablet, TAKE 1 TABLET BY MOUTH TWICE A DAY, Disp: 180 tablet, Rfl: 0 .  rizatriptan (MAXALT-MLT) 10 MG disintegrating tablet, Take 1 tablet (10 mg total) by mouth as needed for migraine. May repeat in 2 hours if needed (Patient not taking: Reported on 12/02/2019), Disp: 9 tablet, Rfl: 11 .  senna (SENOKOT) 8.6 MG tablet, Take 1 tablet by mouth daily as needed for constipation. , Disp: , Rfl:  .  traMADol (ULTRAM) 50 MG tablet, Take 1 tablet by mouth up to twice daily as needed for pain. (Patient not taking: Reported on 12/02/2019), Disp: 30 tablet, Rfl: 0 .  triamcinolone cream (KENALOG) 0.1 %, Apply 1  application topically 2 (two) times daily. Apply for up to one week. (Patient taking differently: Apply 1 application topically 2 (two) times daily as needed (rash). ), Disp: 30 g, Rfl: 0 .  UNABLE TO FIND, Rx: L8000-Post Surgical Bra (Quantity: 6) L8030- Silicone Breast Prosthesis (Quantity: 2) Dx: 174.9; Bilateral partial mastectomy Replacement due to hot flashes since previous items were too heavy and hot, Disp: 1 each, Rfl: 0 .  venlafaxine XR (EFFEXOR-XR) 150 MG 24 hr capsule, TAKE 1 CAPSULE BY MOUTH SEE ADMIN INSTRUCTIONS. NIGHTLY WITH 75 MG CAPSULE, TOTAL OF 225 MG   NIGHTLY, Disp: 90 capsule, Rfl: 3 .  venlafaxine XR (EFFEXOR-XR) 75 MG 24 hr capsule, TAKE 1 CAP DAILY IN COMBINATION WITH VENLAFAXINE XR 150 MG CAPSULE (TO EQUAL 225 MG TOTAL DAILY)., Disp: 90 capsule, Rfl: 0 .  vitamin E 200 UNIT capsule, Take 200 Units by mouth daily., Disp: , Rfl:    Allergies  Allergen Reactions  . Aspirin     unknown  . Penicillins Hives and Swelling    Has patient had a PCN reaction causing immediate rash, facial/tongue/throat swelling, SOB or lightheadedness with hypotension: Yes Has patient had a PCN reaction causing severe rash involving mucus membranes or skin necrosis: Yes Has patient had a PCN reaction that required hospitalization Yes Has patient had a PCN reaction occurring within the last 10 years: No If all of the above answers are "NO", then may proceed with Cephalosporin use.      Review of Systems  Constitutional: Negative.   Respiratory: Negative.  Negative for cough.   Cardiovascular: Negative for chest pain, palpitations and leg swelling.  Neurological: Negative for dizziness and headaches.  Psychiatric/Behavioral: Negative.      Today's Vitals   12/02/19 1514  BP: 122/78  Pulse: 95  Temp: 98.3 F (36.8 C)  TempSrc: Oral  Weight: (!) 309 lb 4.9 oz (140.3 kg)  Height: 5' 4.6" (1.641 m)  PainSc: 6    Body mass index is 52.11 kg/m.   Objective:  Physical Exam Vitals  reviewed.  Constitutional:      General: She is not in acute distress.    Appearance: Normal appearance. She is obese.  Cardiovascular:     Rate and Rhythm: Normal rate and regular rhythm.     Pulses: Normal pulses.     Heart sounds: Normal heart sounds. No murmur heard.   Pulmonary:     Effort: Pulmonary effort is normal. No respiratory distress.     Breath sounds: Normal breath sounds. No wheezing.  Neurological:     General: No focal deficit present.     Mental Status: She is alert and oriented to person, place, and time.  Psychiatric:        Mood and Affect: Mood normal.        Behavior: Behavior normal.        Thought Content: Thought content normal.        Judgment: Judgment normal.         Assessment And Plan:     1. Essential hypertension . B/P is well controlled.  Marland Kitchen BMP ordered to check renal function.  . The importance of regular exercise and dietary modification was stressed to the patient.  - Insulin, random - BMP8+eGFR  2. Super obesity  She is still awaiting going to the weight loss center due to having to pay $100.  Encouraged to continue to stay active   3. Mild intermittent reactive airway disease with wheezing without complication  Chronic, controlled  Continue with current medications  4. Fibromyalgia  Chronic  5. Nausea  I will refer her to GI   She is having to take promethazine on a regular basis  Encouraged to make sure she is having good bowel movements regularly - Ambulatory referral to Gastroenterology - promethazine (PHENERGAN) 25 MG tablet; Take 1 tablet (25 mg total) by mouth every 6 (six) hours as needed for nausea or vomiting.  Dispense: 30 tablet; Refill: 0     Patient was given opportunity to ask questions. Patient verbalized understanding of the plan and was able  to repeat key elements of the plan. All questions were answered to their satisfaction.  Janece Moore, FNP   I, Janece Moore, FNP, have reviewed all  documentation for this visit. The documentation on 12/14/19 for the exam, diagnosis, procedures, and orders are all accurate and complete.  THE PATIENT IS ENCOURAGED TO PRACTICE SOCIAL DISTANCING DUE TO THE COVID-19 PANDEMIC.   

## 2019-12-09 DIAGNOSIS — C50912 Malignant neoplasm of unspecified site of left female breast: Secondary | ICD-10-CM | POA: Diagnosis not present

## 2019-12-09 DIAGNOSIS — C50911 Malignant neoplasm of unspecified site of right female breast: Secondary | ICD-10-CM | POA: Diagnosis not present

## 2019-12-15 ENCOUNTER — Telehealth: Payer: Self-pay | Admitting: Nurse Practitioner

## 2019-12-16 ENCOUNTER — Other Ambulatory Visit: Payer: Self-pay

## 2019-12-16 ENCOUNTER — Other Ambulatory Visit: Payer: Self-pay | Admitting: Nurse Practitioner

## 2019-12-16 ENCOUNTER — Ambulatory Visit: Payer: Medicare Other

## 2019-12-16 ENCOUNTER — Ambulatory Visit
Admission: RE | Admit: 2019-12-16 | Discharge: 2019-12-16 | Disposition: A | Discharge status: Home / Self Care | Payer: Medicare Other | Source: Ambulatory Visit | Attending: Hematology and Oncology | Admitting: Hematology and Oncology

## 2019-12-16 DIAGNOSIS — Z1231 Encounter for screening mammogram for malignant neoplasm of breast: Secondary | ICD-10-CM

## 2019-12-16 DIAGNOSIS — R234 Changes in skin texture: Secondary | ICD-10-CM

## 2019-12-18 ENCOUNTER — Other Ambulatory Visit: Payer: Self-pay | Admitting: Hematology and Oncology

## 2019-12-22 ENCOUNTER — Telehealth: Payer: Self-pay | Admitting: Nurse Practitioner

## 2019-12-22 ENCOUNTER — Ambulatory Visit: Payer: Medicare Other | Admitting: Physical Medicine and Rehabilitation

## 2019-12-22 DIAGNOSIS — C50911 Malignant neoplasm of unspecified site of right female breast: Secondary | ICD-10-CM | POA: Diagnosis not present

## 2019-12-22 DIAGNOSIS — C50912 Malignant neoplasm of unspecified site of left female breast: Secondary | ICD-10-CM | POA: Diagnosis not present

## 2019-12-22 NOTE — Telephone Encounter (Signed)
Ashley Lindsey 12/22/2019 Called pt regarding community resource referral received. Left message for pt to call me back, my info is [phone] please see ref notes for more details.  [place], Care Management

## 2019-12-23 ENCOUNTER — Other Ambulatory Visit: Payer: Self-pay | Admitting: Internal Medicine

## 2019-12-23 DIAGNOSIS — Z6841 Body Mass Index (BMI) 40.0 and over, adult: Secondary | ICD-10-CM

## 2019-12-23 DIAGNOSIS — F32A Depression, unspecified: Secondary | ICD-10-CM

## 2019-12-23 DIAGNOSIS — R519 Headache, unspecified: Secondary | ICD-10-CM

## 2019-12-23 DIAGNOSIS — G8929 Other chronic pain: Secondary | ICD-10-CM

## 2019-12-30 ENCOUNTER — Encounter
Payer: Medicare Other | Attending: Physical Medicine and Rehabilitation | Admitting: Physical Medicine and Rehabilitation

## 2019-12-30 ENCOUNTER — Other Ambulatory Visit: Payer: Self-pay | Admitting: Nurse Practitioner

## 2019-12-30 DIAGNOSIS — G8929 Other chronic pain: Secondary | ICD-10-CM | POA: Insufficient documentation

## 2019-12-30 DIAGNOSIS — M797 Fibromyalgia: Secondary | ICD-10-CM | POA: Insufficient documentation

## 2019-12-30 DIAGNOSIS — R519 Headache, unspecified: Secondary | ICD-10-CM | POA: Insufficient documentation

## 2019-12-30 DIAGNOSIS — M1711 Unilateral primary osteoarthritis, right knee: Secondary | ICD-10-CM | POA: Insufficient documentation

## 2019-12-30 DIAGNOSIS — E669 Obesity, unspecified: Secondary | ICD-10-CM | POA: Insufficient documentation

## 2019-12-30 NOTE — Telephone Encounter (Signed)
Cyclobenzaprine refill 

## 2020-01-01 ENCOUNTER — Other Ambulatory Visit: Payer: Self-pay | Admitting: Hematology and Oncology

## 2020-01-01 ENCOUNTER — Other Ambulatory Visit: Payer: Self-pay | Admitting: Nurse Practitioner

## 2020-01-03 ENCOUNTER — Encounter: Payer: Self-pay | Admitting: Hematology and Oncology

## 2020-01-06 ENCOUNTER — Other Ambulatory Visit: Payer: Medicare Other

## 2020-01-06 ENCOUNTER — Other Ambulatory Visit: Payer: Self-pay | Admitting: Nurse Practitioner

## 2020-01-12 ENCOUNTER — Other Ambulatory Visit: Payer: Self-pay | Admitting: Nurse Practitioner

## 2020-01-12 DIAGNOSIS — M797 Fibromyalgia: Secondary | ICD-10-CM

## 2020-01-21 ENCOUNTER — Telehealth: Payer: Self-pay | Admitting: General Practice

## 2020-01-21 NOTE — Telephone Encounter (Signed)
[place] CSW Progress Notes  Call from patient, she wants to let us know about a breast cancer awareness festival at their church.  Wants [employer] to be involved in their festival.  Passed message on to [place], [employer].  Edwyna Shell, LCSW Clinical Social Worker Phone:  [phone]

## 2020-01-26 ENCOUNTER — Other Ambulatory Visit: Payer: Self-pay | Admitting: Nurse Practitioner

## 2020-01-26 DIAGNOSIS — R030 Elevated blood-pressure reading, without diagnosis of hypertension: Secondary | ICD-10-CM

## 2020-02-03 ENCOUNTER — Ambulatory Visit (INDEPENDENT_AMBULATORY_CARE_PROVIDER_SITE_OTHER): Payer: Medicare Other | Admitting: Nurse Practitioner

## 2020-02-03 ENCOUNTER — Encounter: Payer: Self-pay | Admitting: Nurse Practitioner

## 2020-02-03 ENCOUNTER — Other Ambulatory Visit: Payer: Self-pay

## 2020-02-03 VITALS — BP 122/80 | HR 78 | Temp 98.1°F | Ht 61.6 in | Wt 300.6 lb

## 2020-02-03 DIAGNOSIS — M545 Low back pain, unspecified: Secondary | ICD-10-CM | POA: Diagnosis not present

## 2020-02-03 DIAGNOSIS — Z6841 Body Mass Index (BMI) 40.0 and over, adult: Secondary | ICD-10-CM

## 2020-02-03 DIAGNOSIS — Z0000 Encounter for general adult medical examination without abnormal findings: Secondary | ICD-10-CM

## 2020-02-03 DIAGNOSIS — R600 Localized edema: Secondary | ICD-10-CM

## 2020-02-03 DIAGNOSIS — I10 Essential (primary) hypertension: Secondary | ICD-10-CM

## 2020-02-03 DIAGNOSIS — Z23 Encounter for immunization: Secondary | ICD-10-CM

## 2020-02-03 DIAGNOSIS — G8929 Other chronic pain: Secondary | ICD-10-CM

## 2020-02-03 LAB — POCT URINALYSIS DIPSTICK
Bilirubin, UA: NEGATIVE
Blood, UA: NEGATIVE
Glucose, UA: NEGATIVE
Ketones, UA: NEGATIVE
Leukocytes, UA: NEGATIVE
Nitrite, UA: NEGATIVE
Protein, UA: NEGATIVE
Spec Grav, UA: 1.02 (ref 1.010–1.025)
Urobilinogen, UA: 0.2 E.U./dL
pH, UA: 6.5 (ref 5.0–8.0)

## 2020-02-03 LAB — POCT UA - MICROALBUMIN
Albumin/Creatinine Ratio, Urine, POC: <30
Creatinine, POC: 200 mg/dL
Microalbumin Ur, POC: 10 mg/L

## 2020-02-03 MED ORDER — HYDROCHLOROTHIAZIDE 25 MG PO TABS: 25.0000 mg | ORAL_TABLET | Freq: Every day | ORAL | 1 refills | Status: DC

## 2020-02-03 MED ORDER — ORPHENADRINE CITRATE ER 100 MG PO TB12: 100.0000 mg | ORAL_TABLET | Freq: Two times a day (BID) | ORAL | 1 refills | Status: DC

## 2020-02-03 NOTE — Patient Instructions (Signed)
Health Maintenance, Female Adopting a healthy lifestyle and getting preventive care are important in promoting health and wellness. Ask your health care provider about:  The right schedule for you to have regular tests and exams.  Things you can do on your own to prevent diseases and keep yourself healthy. What should I know about diet, weight, and exercise? Eat a healthy diet   Eat a diet that includes plenty of vegetables, fruits, low-fat dairy products, and lean protein.  Do not eat a lot of foods that are high in solid fats, added sugars, or sodium. Maintain a healthy weight Body mass index (BMI) is used to identify weight problems. It estimates body fat based on height and weight. Your health care provider can help determine your BMI and help you achieve or maintain a healthy weight. Get regular exercise Get regular exercise. This is one of the most important things you can do for your health. Most adults should:  Exercise for at least 150 minutes each week. The exercise should increase your heart rate and make you sweat (moderate-intensity exercise).  Do strengthening exercises at least twice a week. This is in addition to the moderate-intensity exercise.  Spend less time sitting. Even light physical activity can be beneficial. Watch cholesterol and blood lipids Have your blood tested for lipids and cholesterol at 61 years of age, then have this test every 5 years. Have your cholesterol levels checked more often if:  Your lipid or cholesterol levels are high.  You are older than 61 years of age.  You are at high risk for heart disease. What should I know about cancer screening? Depending on your health history and family history, you may need to have cancer screening at various ages. This may include screening for:  Breast cancer.  Cervical cancer.  Colorectal cancer.  Skin cancer.  Lung cancer. What should I know about heart disease, diabetes, and high blood  pressure? Blood pressure and heart disease  High blood pressure causes heart disease and increases the risk of stroke. This is more likely to develop in people who have high blood pressure readings, are of African descent, or are overweight.  Have your blood pressure checked: ? Every 3-5 years if you are 18-39 years of age. ? Every year if you are 40 years old or older. Diabetes Have regular diabetes screenings. This checks your fasting blood sugar level. Have the screening done:  Once every three years after age 40 if you are at a normal weight and have a low risk for diabetes.  More often and at a younger age if you are overweight or have a high risk for diabetes. What should I know about preventing infection? Hepatitis B If you have a higher risk for hepatitis B, you should be screened for this virus. Talk with your health care provider to find out if you are at risk for hepatitis B infection. Hepatitis C Testing is recommended for:  Everyone born from 1945 through 1965.  Anyone with known risk factors for hepatitis C. Sexually transmitted infections (STIs)  Get screened for STIs, including gonorrhea and chlamydia, if: ? You are sexually active and are younger than 61 years of age. ? You are older than 61 years of age and your health care provider tells you that you are at risk for this type of infection. ? Your sexual activity has changed since you were last screened, and you are at increased risk for chlamydia or gonorrhea. Ask your health care provider if   you are at risk.  Ask your health care provider about whether you are at high risk for HIV. Your health care provider may recommend a prescription medicine to help prevent HIV infection. If you choose to take medicine to prevent HIV, you should first get tested for HIV. You should then be tested every 3 months for as long as you are taking the medicine. Pregnancy  If you are about to stop having your period (premenopausal) and  you may become pregnant, seek counseling before you get pregnant.  Take 400 to 800 micrograms (mcg) of folic acid every day if you become pregnant.  Ask for birth control (contraception) if you want to prevent pregnancy. Osteoporosis and menopause Osteoporosis is a disease in which the bones lose minerals and strength with aging. This can result in bone fractures. If you are 65 years old or older, or if you are at risk for osteoporosis and fractures, ask your health care provider if you should:  Be screened for bone loss.  Take a calcium or vitamin D supplement to lower your risk of fractures.  Be given hormone replacement therapy (HRT) to treat symptoms of menopause. Follow these instructions at home: Lifestyle  Do not use any products that contain nicotine or tobacco, such as cigarettes, e-cigarettes, and chewing tobacco. If you need help quitting, ask your health care provider.  Do not use street drugs.  Do not share needles.  Ask your health care provider for help if you need support or information about quitting drugs. Alcohol use  Do not drink alcohol if: ? Your health care provider tells you not to drink. ? You are pregnant, may be pregnant, or are planning to become pregnant.  If you drink alcohol: ? Limit how much you use to 0-1 drink a day. ? Limit intake if you are breastfeeding.  Be aware of how much alcohol is in your drink. In the [place] one drink equals one 12 oz bottle of beer (355 mL), one 5 oz glass of wine (148 mL), or one 1 oz glass of hard liquor (44 mL). General instructions  Schedule regular health, dental, and eye exams.  Stay current with your vaccines.  Tell your health care provider if: ? You often feel depressed. ? You have ever been abused or do not feel safe at home. Summary  Adopting a healthy lifestyle and getting preventive care are important in promoting health and wellness.  Follow your health care provider's instructions about healthy  diet, exercising, and getting tested or screened for diseases.  Follow your health care provider's instructions on monitoring your cholesterol and blood pressure. This information is not intended to replace advice given to you by your health care provider. Make sure you discuss any questions you have with your health care provider. Document Revised: 04/02/2018 Document Reviewed: 04/02/2018 [employer] Patient Education  2020 [employer]  

## 2020-02-03 NOTE — Progress Notes (Signed)
I,[name] [employer] as a Education administrator for [employer], FNP.,have documented all relevant documentation on the behalf of [name], FNP,as directed by  [name], FNP while in the presence of [name], [place]. This visit occurred during the SARS-CoV-2 public health emergency.  Safety protocols were in place, including screening questions prior to the visit, additional usage of staff PPE, and extensive cleaning of exam room while observing appropriate contact time as indicated for disinfecting solutions.  Subjective:     Patient ID: Ashley Lindsey , female    DOB: 12/21/1960 , 59 y.o.   MRN: [id number]   Chief Complaint  Patient presents with  . Annual Exam    HPI  Patient here for HM.  Wt Readings from Last 3 Encounters: 02/03/20 : (!) 300 lb 9.6 oz (136.4 kg) 12/02/19 : (!) 309 lb 4.9 oz (140.3 kg) 12/02/19 : (!) 309 lb 3.2 oz (140.3 kg)  She is to go back for an MRI with Dr. Lindi Adie due to abnormal mammogram.      Past Medical History:  Diagnosis Date  . Abdominal cramps   . Arthritis   . Cancer [facility])    s/p radiation- last dose 6/12//has been off Tamoxifen 8/12  . Chronic headaches   . Costochondritis   . Fatigue   . Fibromyalgia 03/06/2013  . Lymphedema    RIGHT ARM-////  STATES USE LEFT ARM FOR BP'S  . Migraines   . Muscle spasms of head and/or neck    following to the breast  . Passed out    4th of july  . Personal history of radiation therapy 2010   lt breast   . S/P radiation therapy 08/19/07 - 10/10/07   Left Breast/5040 cGy/28 fractions with Boost for a toatl dose of 6300 dGy  . S/P radiation therapy 08/14/10 -10/03/10   right breast  . Sleep apnea    STOP BANG SCORE 5     Family History  Problem Relation Age of Onset  . Breast cancer Mother 74  . Heart disease Father   . Prostate cancer Maternal Uncle 78  . Cancer Maternal Aunt 63       d. from "female cancer" possibly cervical  . Breast cancer Maternal Aunt 60  . Prostate cancer Paternal  Uncle   . Prostate cancer Maternal Grandfather 2  . Prostate cancer Maternal Uncle 80  . Prostate cancer Paternal Uncle   . Breast cancer Cousin 37       mat 1st cousin     Current Outpatient Medications:  .  acetaminophen (TYLENOL) 650 MG CR tablet, Take 650 mg by mouth every 8 (eight) hours as needed for pain., Disp: , Rfl:  .  albuterol (VENTOLIN HFA) 108 (90 Base) MCG/ACT inhaler, INHALE 1-2 PUFFS INTO THE LUNGS EVERY 6 (SIX) HOURS AS NEEDED FOR WHEEZING OR SHORTNESS OF BREATH., Disp: 6.7 g, Rfl: 1 .  Bisacodyl (DULCOLAX PO), Take 1 capsule by mouth daily as needed (constipation). , Disp: , Rfl:  .  budesonide-formoterol (SYMBICORT) 80-4.5 MCG/ACT inhaler, Inhale 2 puffs into the lungs in the morning and at bedtime., Disp: 30.6 g, Rfl: 1 .  cholecalciferol 5000 units TABS, Take 1 tablet (5,000 Units total) by mouth 2 (two) times daily. (Patient taking differently: Take 5,000 Units by mouth daily.), Disp: , Rfl:  .  diphenhydrAMINE (BENADRYL) 25 mg capsule, Take 25 mg by mouth 2 (two) times daily., Disp: , Rfl:  .  fish oil-omega-3 fatty acids 1000 MG capsule, Take 1  g by mouth daily., Disp: , Rfl:  .  Flaxseed, Linseed, (FLAXSEED OIL MAX STR PO), Take 1 capsule by mouth daily. , Disp: , Rfl:  .  gabapentin (NEURONTIN) 300 MG capsule, Take 1 capsule (300 mg total) by mouth 3 (three) times daily. Medication may be dispensed to patient today. If you have questions, please call Dr. Ranell Patrick at [phone]., Disp: 90 capsule, Rfl: 3 .  hydrochlorothiazide (HYDRODIURIL) 25 MG tablet, Take 1 tablet (25 mg total) by mouth daily., Disp: 90 tablet, Rfl: 1 .  Iron-FA-B Cmp-C-Biot-Probiotic (FUSION PLUS) CAPS, Take 1 capsule by mouth daily., Disp: 30 capsule, Rfl: 3 .  Multiple Vitamins-Minerals (MULTIVITAMIN & MINERAL PO), Take 1 tablet by mouth daily., Disp: , Rfl:  .  omeprazole (PRILOSEC) 20 MG capsule, Take 20 mg by mouth daily., Disp: , Rfl:  .  orphenadrine (NORFLEX) 100 MG tablet, Take 1  tablet (100 mg total) by mouth 2 (two) times daily., Disp: 60 tablet, Rfl: 1 .  promethazine (PHENERGAN) 25 MG tablet, Take 1 tablet (25 mg total) by mouth every 6 (six) hours as needed for nausea or vomiting., Disp: 30 tablet, Rfl: 0 .  senna (SENOKOT) 8.6 MG tablet, Take 1 tablet by mouth daily as needed for constipation. , Disp: , Rfl:  .  triamcinolone cream (KENALOG) 0.1 %, Apply 1 application topically 2 (two) times daily. Apply for up to one week. (Patient taking differently: Apply 1 application topically 2 (two) times daily as needed (rash).), Disp: 30 g, Rfl: 0 .  UNABLE TO FIND, Rx: L8000-Post Surgical Bra (Quantity: 6) C1638- Silicone Breast Prosthesis (Quantity: 2) Dx: 174.9; Bilateral partial mastectomy Replacement due to hot flashes since previous items were too heavy and hot, Disp: 1 each, Rfl: 0 .  vitamin E 200 UNIT capsule, Take 200 Units by mouth daily., Disp: , Rfl:  .  cyclobenzaprine (FLEXERIL) 10 MG tablet, TAKE 1 TABLET THREE TIMES DAILY AS NEEDED FOR MUSCLE SPASMS, Disp: 90 tablet, Rfl: 0 .  DEXILANT 60 MG capsule, TAKE 1 CAPSULE BY MOUTH EVERY DAY, Disp: 90 capsule, Rfl: 1 .  Erenumab-aooe (AIMOVIG) 140 MG/ML SOAJ, Inject 140 mg into the skin every 30 (thirty) days., Disp: 1 pen, Rfl: 11 .  meloxicam (MOBIC) 7.5 MG tablet, TAKE 1 TABLET BY MOUTH EVERY DAY, Disp: 90 tablet, Rfl: 0 .  ondansetron (ZOFRAN) 4 MG tablet, Take 1 tablet (4 mg total) by mouth daily as needed for nausea or vomiting., Disp: 30 tablet, Rfl: 1 .  oxyCODONE-acetaminophen (PERCOCET/ROXICET) 5-325 MG tablet, Take 1 tablet by mouth every 4 (four) hours as needed for severe pain., Disp: 15 tablet, Rfl: 0 .  propranolol (INDERAL) 20 MG tablet, TAKE 1 TABLET BY MOUTH TWICE A DAY, Disp: 180 tablet, Rfl: 0 .  rizatriptan (MAXALT-MLT) 10 MG disintegrating tablet, Take 1 tablet (10 mg total) by mouth as needed for migraine. May repeat in 2 hours if needed, Disp: 9 tablet, Rfl: 11 .  traMADol (ULTRAM) 50 MG tablet,  Take 1 tablet by mouth up to twice daily as needed for pain., Disp: 30 tablet, Rfl: 0 .  traMADol (ULTRAM) 50 MG tablet, Take 1 tablet (50 mg total) by mouth every 12 (twelve) hours as needed., Disp: 30 tablet, Rfl: 0 .  traMADol (ULTRAM) 50 MG tablet, 1 tablet PO BID prn, Disp: 30 tablet, Rfl: 0 .  traMADol (ULTRAM) 50 MG tablet, Take 1 tablet (50 mg total) by mouth 2 (two) times daily as needed., Disp: 30 tablet, Rfl: 0 .  venlafaxine XR (EFFEXOR-XR) 150 MG 24 hr capsule, TAKE 1 CAPSULE BY MOUTH SEE ADMIN INSTRUCTIONS. NIGHTLY WITH 75 MG CAPSULE, TOTAL OF 225 MG NIGHTLY, Disp: 90 capsule, Rfl: 0 .  venlafaxine XR (EFFEXOR-XR) 75 MG 24 hr capsule, TAKE 1 CAPSULE BY MOUTH DAILY IN COMBINATION WITH XR 150 MG (TO EQUAL 225 MG TOTAL DAILY)., Disp: 90 capsule, Rfl: 0   Allergies  Allergen Reactions  . Aspirin     unknown  . Penicillins Hives and Swelling    Has patient had a PCN reaction causing immediate rash, facial/tongue/throat swelling, SOB or lightheadedness with hypotension: Yes Has patient had a PCN reaction causing severe rash involving mucus membranes or skin necrosis: Yes Has patient had a PCN reaction that required hospitalization Yes Has patient had a PCN reaction occurring within the last 10 years: No If all of the above answers are "NO", then may proceed with Cephalosporin use.       The patient states she uses status post hysterectomy for birth control.  Negative for: breast discharge, breast lump(s), breast pain and breast self exam. Associated symptoms include abnormal vaginal bleeding. Pertinent negatives include abnormal bleeding (hematology), anxiety, decreased libido, depression, difficulty falling sleep, dyspareunia, history of infertility, nocturia, sexual dysfunction, sleep disturbances, urinary incontinence, urinary urgency, vaginal discharge and vaginal itching. Diet regular. The patient states her exercise level is moderate - she is exercising in the pool.    The  patient's tobacco use is:  Social History   Tobacco Use  Smoking Status Former Smoker  . Packs/day: 0.25  . Years: 1.00  . Pack years: 0.25  . Types: Cigarettes  Smokeless Tobacco Never Used   She has been exposed to passive smoke. The patient's alcohol use is:  Social History   Substance and Sexual Activity  Alcohol Use Not Currently    Review of Systems  Constitutional: Negative.   HENT: Negative.   Eyes: Negative.   Respiratory: Negative.  Negative for cough.   Cardiovascular: Negative.  Negative for chest pain, palpitations and leg swelling.  Gastrointestinal: Negative.   Endocrine: Negative.   Genitourinary: Negative.   Musculoskeletal: Negative.   Skin: Negative.   Allergic/Immunologic: Negative.   Neurological: Negative.  Negative for dizziness and headaches.  Hematological: Negative.   Psychiatric/Behavioral: Negative.      Today's Vitals   02/03/20 1121  BP: 122/80  Pulse: 78  Temp: 98.1 F (36.7 C)  Weight: (!) 300 lb 9.6 oz (136.4 kg)  Height: 5' 1.6" (1.565 m)  PainSc: 8    Body mass index is 55.7 kg/m.   Objective:  Physical Exam Constitutional:      General: She is not in acute distress.    Appearance: Normal appearance. She is well-developed. She is obese.  HENT:     Head: Normocephalic and atraumatic.     Right Ear: Hearing, tympanic membrane, ear canal and external ear normal. There is no impacted cerumen.     Left Ear: Hearing, tympanic membrane, ear canal and external ear normal. There is no impacted cerumen.     Nose:     Comments: Deferred - masked    Mouth/Throat:     Comments: Deferred - masked Eyes:     General: Lids are normal.     Extraocular Movements: Extraocular movements intact.     Conjunctiva/sclera: Conjunctivae normal.     Pupils: Pupils are equal, round, and reactive to light.     Funduscopic exam:    Right eye: No papilledema.  Left eye: No papilledema.  Neck:     Thyroid: No thyroid mass.     Vascular:  No carotid bruit.  Cardiovascular:     Rate and Rhythm: Normal rate and regular rhythm.     Pulses: Normal pulses.     Heart sounds: Normal heart sounds. No murmur heard.   Pulmonary:     Effort: Pulmonary effort is normal. No respiratory distress.     Breath sounds: Normal breath sounds. No wheezing.  Abdominal:     General: Abdomen is flat. Bowel sounds are normal. There is no distension.     Palpations: Abdomen is soft.     Tenderness: There is no abdominal tenderness.  Genitourinary:    Rectum: Guaiac result negative.  Musculoskeletal:        General: No swelling. Normal range of motion.     Cervical back: Full passive range of motion without pain, normal range of motion and neck supple.     Right lower leg: Edema (trace - 1+) present.     Left lower leg: Edema (trace - 1+) present.  Skin:    General: Skin is warm and dry.     Capillary Refill: Capillary refill takes less than 2 seconds.  Neurological:     General: No focal deficit present.     Mental Status: She is alert and oriented to person, place, and time.     Cranial Nerves: No cranial nerve deficit.     Sensory: No sensory deficit.  Psychiatric:        Mood and Affect: Mood normal.        Behavior: Behavior normal.        Thought Content: Thought content normal.        Judgment: Judgment normal.         Assessment And Plan:     1. Encounter for general adult medical examination w/o abnormal findings . Behavior modifications discussed and diet history reviewed.   . Pt will continue to exercise regularly and modify diet with low GI, plant based foods and decrease intake of processed foods.  . Recommend intake of daily multivitamin, Vitamin D, and calcium.  . Recommend mammogram and colonoscopy (both are up to date) for preventive screenings, as well as recommend immunizations that include influenza, TDAP  2. Body mass index (BMI) of 50-59.9 in adult [facility]) She has lost 9 lbs and congratulated on this Continue  with healthy diet and regular exercise as tolerated  3. Essential hypertension . B/P is controlled.  . CMP ordered to check renal function.  . The importance of regular exercise and dietary modification was stressed to the patient.  . Stressed importance of losing ten percent of her body weight to help with B/P control.  . The weight loss would help with decreasing cardiac and cancer risk as well.  . EKG done no changes - POCT Urinalysis Dipstick (81002) - POCT UA - Microalbumin - EKG 12-Lead - Lipid panel - CBC - CMP14+EGFR  4. Lower extremity edema  Trace - 1+ edema, she is to take HCTZ  Discussed importance of eating high potassium foods - hydrochlorothiazide (HYDRODIURIL) 25 MG tablet; Take 1 tablet (25 mg total) by mouth daily.  Dispense: 90 tablet; Refill: 1  5. Need for influenza vaccination  Influenza vaccine administered  Encouraged to take Tylenol as needed for fever or muscle aches. - Flu Vaccine QUAD 6+ mos PF IM (Fluarix Quad PF)  6. Chronic bilateral low back pain without sciatica  She  has been using cyclobenzaprine as needed  Encouraged to stretch regularly - CMP14+EGFR     Patient was given opportunity to ask questions. Patient verbalized understanding of the plan and was able to repeat key elements of the plan. All questions were answered to their satisfaction.    Teola Bradley, FNP, have reviewed all documentation for this visit. The documentation on 04/20/20 for the exam, diagnosis, procedures, and orders are all accurate and complete.  THE PATIENT IS ENCOURAGED TO PRACTICE SOCIAL DISTANCING DUE TO THE COVID-19 PANDEMIC.

## 2020-02-04 LAB — CBC
Hematocrit: 40.4 % (ref 34.0–46.6)
Hemoglobin: 12.7 g/dL (ref 11.1–15.9)
MCH: 26.3 pg — ABNORMAL LOW (ref 26.6–33.0)
MCHC: 31.4 g/dL — ABNORMAL LOW (ref 31.5–35.7)
MCV: 84 fL (ref 79–97)
Platelets: 356 10*3/uL (ref 150–450)
RBC: 4.83 x10E6/uL (ref 3.77–5.28)
RDW: 13.1 % (ref 11.7–15.4)
WBC: 8.9 10*3/uL (ref 3.4–10.8)

## 2020-02-04 LAB — LIPID PANEL
Chol/HDL Ratio: 3.1 ratio (ref 0.0–4.4)
Cholesterol, Total: 221 mg/dL — ABNORMAL HIGH (ref 100–199)
HDL: 72 mg/dL (ref >39)
LDL Chol Calc (NIH): 127 mg/dL — ABNORMAL HIGH (ref 0–99)
Triglycerides: 127 mg/dL (ref 0–149)
VLDL Cholesterol Cal: 22 mg/dL (ref 5–40)

## 2020-02-04 LAB — CMP14+EGFR
ALT: 16 IU/L (ref 0–32)
AST: 20 IU/L (ref 0–40)
Albumin/Globulin Ratio: 1.7 (ref 1.2–2.2)
Albumin: 4.3 g/dL (ref 3.8–4.9)
Alkaline Phosphatase: 90 IU/L (ref 44–121)
BUN/Creatinine Ratio: 12 (ref 12–28)
BUN: 8 mg/dL (ref 8–27)
Bilirubin Total: 0.2 mg/dL (ref 0.0–1.2)
CO2: 29 mmol/L (ref 20–29)
Calcium: 9.2 mg/dL (ref 8.7–10.3)
Chloride: 102 mmol/L (ref 96–106)
Creatinine, Ser: 0.67 mg/dL (ref 0.57–1.00)
GFR calc Af Amer: 110 mL/min/{1.73_m2} (ref >59)
GFR calc non Af Amer: 96 mL/min/{1.73_m2} (ref >59)
Globulin, Total: 2.6 g/dL (ref 1.5–4.5)
Glucose: 96 mg/dL (ref 65–99)
Potassium: 4.9 mmol/L (ref 3.5–5.2)
Sodium: 143 mmol/L (ref 134–144)
Total Protein: 6.9 g/dL (ref 6.0–8.5)

## 2020-02-09 ENCOUNTER — Encounter: Payer: Self-pay | Admitting: Orthopaedic Surgery

## 2020-02-09 ENCOUNTER — Ambulatory Visit: Payer: Self-pay

## 2020-02-09 ENCOUNTER — Other Ambulatory Visit: Payer: Self-pay

## 2020-02-09 ENCOUNTER — Ambulatory Visit (INDEPENDENT_AMBULATORY_CARE_PROVIDER_SITE_OTHER): Payer: Medicare Other | Admitting: Orthopaedic Surgery

## 2020-02-09 DIAGNOSIS — M170 Bilateral primary osteoarthritis of knee: Secondary | ICD-10-CM

## 2020-02-09 DIAGNOSIS — M25562 Pain in left knee: Secondary | ICD-10-CM | POA: Diagnosis not present

## 2020-02-09 DIAGNOSIS — G8929 Other chronic pain: Secondary | ICD-10-CM | POA: Diagnosis not present

## 2020-02-09 DIAGNOSIS — M25561 Pain in right knee: Secondary | ICD-10-CM

## 2020-02-09 MED ORDER — BUPIVACAINE HCL 0.5 % IJ SOLN
2.0000 mL | INTRAMUSCULAR | Status: AC | PRN
  Administered 2020-02-09: 2 mL via INTRA_ARTICULAR

## 2020-02-09 MED ORDER — LIDOCAINE HCL 1 % IJ SOLN
2.0000 mL | INTRAMUSCULAR | Status: AC | PRN
  Administered 2020-02-09: 2 mL

## 2020-02-09 NOTE — Progress Notes (Signed)
Office Visit Note   Patient: Ashley Lindsey           Date of Birth: 07-Jul-1960           MRN: [id number] Visit Date: 02/09/2020              Requested by: [name], [address] [place],  [place] [address] PCP: [name], FNP   Assessment & Plan: Visit Diagnoses:  1. Chronic pain of both knees   2. Bilateral primary osteoarthritis of knee     Plan:  #1: At this time we have injected the right knee with corticosteroid injections. #2: She will follow back up in the next 1 to 2 weeks and we can inject the left knee at that time.  Follow-Up Instructions: Return in about 2 weeks (around 02/23/2020) for follow up injection left knee.   Orders:  Orders Placed This Encounter  Procedures  . Large Joint Inj  . XR KNEE 3 VIEW LEFT  . XR KNEE 3 VIEW RIGHT   No orders of the defined types were placed in this encounter.     Procedures: Large Joint Inj: R knee on 02/09/2020 4:19 PM Indications: pain and diagnostic evaluation Details: 25 G 1.5 in needle, anteromedial approach  Arthrogram: No  Medications: 2 mL lidocaine 1 %; 2 mL bupivacaine 0.5 % Outcome: tolerated well, no immediate complications  2 mL of betamethasone Procedure, treatment alternatives, risks and benefits explained, specific risks discussed. Consent was given by the patient. Immediately prior to procedure a time out was called to verify the correct patient, procedure, equipment, support staff and site/side marked as required. Patient was prepped and draped in the usual sterile fashion.       Clinical Data: No additional findings.   Subjective: Chief Complaint  Patient presents with  . Right Knee - Pain  . Left Knee - Pain   HPI Patient presents today for bilateral knee pain. No known injury. She states that her right knee hurts the most. She received a cortisone injection in May of this year and she states that it helped. Her pain is locater anteriorly and has swelling in  her lower extremities bilaterally. She said that her lower back is also painful, but feels it is coming from how she walks due to her bilateral knee pain. She does water aerobics twice weekly. She is weaning off the prednisone. She is not diabetic.   Review of Systems  Constitutional: Negative for fatigue.  HENT: Negative for ear pain.   Eyes: Negative for pain.  Respiratory: Positive for shortness of breath.   Cardiovascular: Positive for leg swelling.  Gastrointestinal: Negative for constipation and diarrhea.  Endocrine: Negative for cold intolerance and heat intolerance.  Genitourinary: Negative for difficulty urinating.  Musculoskeletal: Negative for joint swelling.  Skin: Negative for rash.  Allergic/Immunologic: Negative for food allergies.  Neurological: Negative for weakness.  Hematological: Does not bruise/bleed easily.  Psychiatric/Behavioral: Positive for sleep disturbance.     Objective: Vital Signs: There were no vitals taken for this visit.  Physical Exam Constitutional:      Appearance: Normal appearance. She is obese.  HENT:     Head: Normocephalic.     Nose: Nose normal.  Cardiovascular:     Rate and Rhythm: Normal rate.     Pulses: Normal pulses.  Pulmonary:     Effort: Pulmonary effort is normal.  Musculoskeletal:        General: Swelling and tenderness present.  Skin:  General: Skin is warm and dry.  Neurological:     Mental Status: She is alert and oriented to person, place, and time.     Ortho Exam  Exam today reveals a mild effusion.  It is difficult to tell for sure secondary to her exogenous obesity.  She lacks a few degrees of full extension.  Flexes to about 90 to 95 degrees.  Crepitance with range of motion bilaterally.  Neurovascular intact distally.  Specialty Comments:  No specialty comments available.  Imaging: XR KNEE 3 VIEW LEFT  Result Date: 02/09/2020 Three-view x-ray of the left knee reveals complete obliteration of the  medial compartment of the knee as well as periarticular spurring and loss of the medial tibial plateau lateral edge.  She has about 11 degrees of varus deformity with standing.  Marked lateral patellar subluxation.  XR KNEE 3 VIEW RIGHT  Result Date: 02/09/2020 Three-view x-ray of the right knee reveals mild varus positioning of the knee.  Medial compartment is essentially bone-on-bone.  Some periarticular spurring both medial and lateral compartments.  Patellofemoral has marked changes with periarticular spurring also.    PMFS History: Current Outpatient Medications  Medication Sig Dispense Refill  . acetaminophen (TYLENOL) 650 MG CR tablet Take 650 mg by mouth every 8 (eight) hours as needed for pain.    Marland Kitchen albuterol (VENTOLIN HFA) 108 (90 Base) MCG/ACT inhaler INHALE 1-2 PUFFS INTO THE LUNGS EVERY 6 (SIX) HOURS AS NEEDED FOR WHEEZING OR SHORTNESS OF BREATH. 6.7 g 1  . Bisacodyl (DULCOLAX PO) Take 1 capsule by mouth daily as needed (constipation).     . budesonide-formoterol (SYMBICORT) 80-4.5 MCG/ACT inhaler Inhale 2 puffs into the lungs in the morning and at bedtime. 30.6 g 1  . cholecalciferol 5000 units TABS Take 1 tablet (5,000 Units total) by mouth 2 (two) times daily. (Patient taking differently: Take 5,000 Units by mouth daily. )    . DEXILANT 60 MG capsule TAKE 1 CAPSULE BY MOUTH EVERY DAY 90 capsule 1  . diphenhydrAMINE (BENADRYL) 25 mg capsule Take 25 mg by mouth 2 (two) times daily.    Eduard Roux (AIMOVIG) 140 MG/ML SOAJ Inject 140 mg into the skin every 30 (thirty) days. 1 pen 11  . fish oil-omega-3 fatty acids 1000 MG capsule Take 1 g by mouth daily.     . Flaxseed, Linseed, (FLAXSEED OIL MAX STR PO) Take 1 capsule by mouth daily.     Marland Kitchen gabapentin (NEURONTIN) 300 MG capsule Take 1 capsule (300 mg total) by mouth 3 (three) times daily. Medication may be dispensed to patient today. If you have questions, please call Dr. Ranell Patrick at [phone]. 90 capsule 3  .  hydrochlorothiazide (HYDRODIURIL) 25 MG tablet Take 1 tablet (25 mg total) by mouth daily. 90 tablet 1  . Iron-FA-B Cmp-C-Biot-Probiotic (FUSION PLUS) CAPS Take 1 capsule by mouth daily. 30 capsule 3  . meloxicam (MOBIC) 7.5 MG tablet TAKE 1 TABLET BY MOUTH EVERY DAY 30 tablet 0  . Multiple Vitamins-Minerals (MULTIVITAMIN & MINERAL PO) Take 1 tablet by mouth daily.    Marland Kitchen omeprazole (PRILOSEC) 20 MG capsule Take 20 mg by mouth daily.    . orphenadrine (NORFLEX) 100 MG tablet Take 1 tablet (100 mg total) by mouth 2 (two) times daily. 60 tablet 1  . promethazine (PHENERGAN) 25 MG tablet Take 1 tablet (25 mg total) by mouth every 6 (six) hours as needed for nausea or vomiting. 30 tablet 0  . propranolol (INDERAL) 20 MG tablet TAKE 1 TABLET  BY MOUTH TWICE A DAY 180 tablet 0  . rizatriptan (MAXALT-MLT) 10 MG disintegrating tablet Take 1 tablet (10 mg total) by mouth as needed for migraine. May repeat in 2 hours if needed 9 tablet 11  . senna (SENOKOT) 8.6 MG tablet Take 1 tablet by mouth daily as needed for constipation.     . traMADol (ULTRAM) 50 MG tablet Take 1 tablet by mouth up to twice daily as needed for pain. 30 tablet 0  . triamcinolone cream (KENALOG) 0.1 % Apply 1 application topically 2 (two) times daily. Apply for up to one week. (Patient taking differently: Apply 1 application topically 2 (two) times daily as needed (rash). ) 30 g 0  . UNABLE TO FIND Rx: L8000-Post Surgical Bra (Quantity: 6) D7412- Silicone Breast Prosthesis (Quantity: 2) Dx: 174.9; Bilateral partial mastectomy Replacement due to hot flashes since previous items were too heavy and hot 1 each 0  . venlafaxine XR (EFFEXOR-XR) 150 MG 24 hr capsule TAKE 1 CAPSULE BY MOUTH SEE ADMIN INSTRUCTIONS. NIGHTLY WITH 75 MG CAPSULE, TOTAL OF 225 MG NIGHTLY 90 capsule 3  . venlafaxine XR (EFFEXOR-XR) 75 MG 24 hr capsule TAKE 1 CAPSULE BY MOUTH DAILY IN COMBINATION WITH VENLAFAXINE XR 150 MG (TO EQUAL 225 MG TOTAL DAILY). 90 capsule 0  .  vitamin E 200 UNIT capsule Take 200 Units by mouth daily.     No current facility-administered medications for this visit.    Patient Active Problem List   Diagnosis Date Noted  . Bilateral primary osteoarthritis of knee 02/09/2020  . Chronic intermittent hypoxia with obstructive sleep apnea 08/24/2019  . Chronic intractable headache 07/14/2019  . Morning headache 07/14/2019  . Loud snoring 07/14/2019  . Super obesity 07/14/2019  . Sleeps in sitting position due to orthopnea 07/14/2019  . Chest pain due to GERD 07/14/2019  . Nocturia more than twice per night 07/14/2019  . Unilateral primary osteoarthritis, right knee 05/26/2019  . Chronic migraine without aura without status migrainosus, not intractable 05/13/2019  . Class 3 severe obesity due to excess calories without serious comorbidity with body mass index (BMI) of 40.0 to 44.9 in adult ([place]) 08/05/2018  . Depression 08/05/2018  . Chronic nonintractable headache 08/05/2018  . Elevated blood-pressure reading without diagnosis of hypertension 06/06/2018  . Chronic pain of right ankle 04/24/2018  . Breast cancer of lower-outer quadrant of right female breast ([place]) 12/02/2014  . Bronchospasm 09/21/2014  . Dyspnea 09/21/2014  . Chronic pain of right knee 09/21/2014  . Fibromyalgia 03/06/2013  . Dense breasts 03/06/2013  . Mastalgia 03/06/2013  . Back pain, lumbosacral 03/06/2013  . Lymphedema of arm 03/06/2013  . Atypical chest pain 01/06/2013  . Breast cancer of upper-inner quadrant of left female breast ([place]) 09/08/2012  . Family history of malignant neoplasm of breast 09/08/2012  . S/P radiation therapy   . History of breast cancer in female 08/13/2011   Past Medical History:  Diagnosis Date  . Abdominal cramps   . Arthritis   . Cancer [facility])    s/p radiation- last dose 6/12//has been off Tamoxifen 8/12  . Chronic headaches   . Costochondritis   . Fatigue   . Fibromyalgia 03/06/2013  . Lymphedema    RIGHT ARM-////   STATES USE LEFT ARM FOR BP'S  . Migraines   . Muscle spasms of head and/or neck    following to the breast  . Passed out    4th of july  . Personal history of radiation therapy 2010  lt breast   . S/P radiation therapy 08/19/07 - 10/10/07   Left Breast/5040 cGy/28 fractions with Boost for a toatl dose of 6300 dGy  . S/P radiation therapy 08/14/10 -10/03/10   right breast  . Sleep apnea    STOP BANG SCORE 5    Family History  Problem Relation Age of Onset  . Breast cancer Mother 104  . Heart disease Father   . Prostate cancer Maternal Uncle 78  . Cancer Maternal Aunt 63       d. from "female cancer" possibly cervical  . Breast cancer Maternal Aunt 60  . Prostate cancer Paternal Uncle   . Prostate cancer Maternal Grandfather 89  . Prostate cancer Maternal Uncle 80  . Prostate cancer Paternal Uncle   . Breast cancer Cousin 5       mat 1st cousin    Past Surgical History:  Procedure Laterality Date  . ABDOMINAL HYSTERECTOMY     with left salpingooophorectomy  . BREAST LUMPECTOMY  2009/2012   LEFT/RIGHT with lymph node dissection  . KNEE ARTHROSCOPY     right  . OOPHORECTOMY  2013   Social History   Occupational History  . Occupation: disability  Tobacco Use  . Smoking status: Former Smoker    Packs/day: 0.25    Years: 1.00    Pack years: 0.25    Types: Cigarettes  . Smokeless tobacco: Never Used  Vaping Use  . Vaping Use: Never used  Substance and Sexual Activity  . Alcohol use: Not Currently  . Drug use: No  . Sexual activity: Not Currently    Birth control/protection: Surgical

## 2020-02-15 ENCOUNTER — Encounter: Payer: Self-pay | Admitting: Orthopaedic Surgery

## 2020-02-15 ENCOUNTER — Other Ambulatory Visit: Payer: Self-pay | Admitting: Nurse Practitioner

## 2020-02-15 ENCOUNTER — Other Ambulatory Visit: Payer: Self-pay | Admitting: Orthopaedic Surgery

## 2020-02-15 DIAGNOSIS — M797 Fibromyalgia: Secondary | ICD-10-CM

## 2020-02-15 MED ORDER — TRAMADOL HCL 50 MG PO TABS: 50.0000 mg | ORAL_TABLET | Freq: Two times a day (BID) | ORAL | 0 refills | Status: DC | PRN

## 2020-02-16 ENCOUNTER — Other Ambulatory Visit: Payer: Self-pay

## 2020-02-16 ENCOUNTER — Other Ambulatory Visit: Payer: Self-pay | Admitting: *Deleted

## 2020-02-16 ENCOUNTER — Encounter: Payer: Self-pay | Admitting: Hematology and Oncology

## 2020-02-16 ENCOUNTER — Telehealth: Payer: Self-pay | Admitting: Adult Health

## 2020-02-16 ENCOUNTER — Encounter: Payer: Self-pay | Admitting: Orthopaedic Surgery

## 2020-02-16 DIAGNOSIS — C50419 Malignant neoplasm of upper-outer quadrant of unspecified female breast: Secondary | ICD-10-CM

## 2020-02-16 DIAGNOSIS — M797 Fibromyalgia: Secondary | ICD-10-CM

## 2020-02-16 DIAGNOSIS — C50511 Malignant neoplasm of lower-outer quadrant of right female breast: Secondary | ICD-10-CM

## 2020-02-16 MED ORDER — TRAMADOL HCL 50 MG PO TABS
ORAL_TABLET | ORAL | 0 refills | Status: DC
Start: 2020-02-16 — End: 2020-06-01

## 2020-02-16 MED ORDER — CYCLOBENZAPRINE HCL 10 MG PO TABS: ORAL_TABLET | ORAL | 0 refills | Status: DC

## 2020-02-16 MED ORDER — MELOXICAM 7.5 MG PO TABS: 7.5000 mg | ORAL_TABLET | Freq: Every day | ORAL | 0 refills | Status: DC

## 2020-02-16 NOTE — Telephone Encounter (Signed)
[facility] for tramadol 50mg  #30 1 tab po bid prn

## 2020-02-16 NOTE — Telephone Encounter (Signed)
Patient was scheduled for an initial cpap follow up tomorrow with Jinny Blossom at 1pm. Could not find patient in [facility] or [facility]. Called and spoke with patient. She stated that she has had several health setbacks and has not received her machine yet. She has been in contact with [facility] and they are aware of her situation. She stated that as soon as she is stable again, she will contact [facility] to make an appt.   I advised her to call us if she needed anything, she verbalized understanding.

## 2020-02-17 ENCOUNTER — Encounter (HOSPITAL_COMMUNITY): Payer: Self-pay

## 2020-02-17 ENCOUNTER — Telehealth: Payer: Self-pay | Admitting: *Deleted

## 2020-02-17 ENCOUNTER — Other Ambulatory Visit: Payer: Self-pay

## 2020-02-17 ENCOUNTER — Emergency Department (HOSPITAL_COMMUNITY)
Admission: EM | Admit: 2020-02-17 | Discharge: 2020-02-17 | Disposition: A | Discharge status: Home / Self Care | Payer: Medicare Other | Attending: Emergency Medicine | Admitting: Emergency Medicine

## 2020-02-17 ENCOUNTER — Ambulatory Visit: Payer: Self-pay | Admitting: Adult Health

## 2020-02-17 ENCOUNTER — Emergency Department (HOSPITAL_COMMUNITY): Payer: Medicare Other

## 2020-02-17 DIAGNOSIS — R0789 Other chest pain: Secondary | ICD-10-CM | POA: Diagnosis not present

## 2020-02-17 DIAGNOSIS — M25511 Pain in right shoulder: Secondary | ICD-10-CM | POA: Insufficient documentation

## 2020-02-17 DIAGNOSIS — Z880 Allergy status to penicillin: Secondary | ICD-10-CM | POA: Insufficient documentation

## 2020-02-17 DIAGNOSIS — M549 Dorsalgia, unspecified: Secondary | ICD-10-CM | POA: Diagnosis not present

## 2020-02-17 DIAGNOSIS — R079 Chest pain, unspecified: Secondary | ICD-10-CM | POA: Diagnosis not present

## 2020-02-17 DIAGNOSIS — Z87891 Personal history of nicotine dependence: Secondary | ICD-10-CM | POA: Diagnosis not present

## 2020-02-17 DIAGNOSIS — Z923 Personal history of irradiation: Secondary | ICD-10-CM | POA: Diagnosis not present

## 2020-02-17 DIAGNOSIS — Z853 Personal history of malignant neoplasm of breast: Secondary | ICD-10-CM | POA: Insufficient documentation

## 2020-02-17 DIAGNOSIS — M62838 Other muscle spasm: Secondary | ICD-10-CM

## 2020-02-17 DIAGNOSIS — J90 Pleural effusion, not elsewhere classified: Secondary | ICD-10-CM | POA: Diagnosis not present

## 2020-02-17 LAB — TROPONIN I (HIGH SENSITIVITY)
Troponin I (High Sensitivity): 6 ng/L (ref <18)
Troponin I (High Sensitivity): 6 ng/L (ref <18)

## 2020-02-17 LAB — BASIC METABOLIC PANEL
Anion gap: 10 (ref 5–15)
BUN: 13 mg/dL (ref 8–23)
CO2: 33 mmol/L — ABNORMAL HIGH (ref 22–32)
Calcium: 9.1 mg/dL (ref 8.9–10.3)
Chloride: 95 mmol/L — ABNORMAL LOW (ref 98–111)
Creatinine, Ser: 0.62 mg/dL (ref 0.44–1.00)
GFR, Estimated: >60 mL/min (ref >60)
Glucose, Bld: 103 mg/dL — ABNORMAL HIGH (ref 70–99)
Potassium: 3.7 mmol/L (ref 3.5–5.1)
Sodium: 138 mmol/L (ref 135–145)

## 2020-02-17 LAB — CBC
HCT: 42.4 % (ref 36.0–46.0)
Hemoglobin: 12.6 g/dL (ref 12.0–15.0)
MCH: 26.4 pg (ref 26.0–34.0)
MCHC: 29.7 g/dL — ABNORMAL LOW (ref 30.0–36.0)
MCV: 88.7 fL (ref 80.0–100.0)
Platelets: 312 10*3/uL (ref 150–400)
RBC: 4.78 MIL/uL (ref 3.87–5.11)
RDW: 14.7 % (ref 11.5–15.5)
WBC: 10.5 10*3/uL (ref 4.0–10.5)
nRBC: 0 % (ref 0.0–0.2)

## 2020-02-17 LAB — HEPATIC FUNCTION PANEL
ALT: 20 U/L (ref 0–44)
AST: 19 U/L (ref 15–41)
Albumin: 3.8 g/dL (ref 3.5–5.0)
Alkaline Phosphatase: 69 U/L (ref 38–126)
Bilirubin, Direct: <0.1 mg/dL (ref 0.0–0.2)
Total Bilirubin: 0.4 mg/dL (ref 0.3–1.2)
Total Protein: 7.2 g/dL (ref 6.5–8.1)

## 2020-02-17 LAB — MAGNESIUM: Magnesium: 2 mg/dL (ref 1.7–2.4)

## 2020-02-17 LAB — D-DIMER, QUANTITATIVE: D-Dimer, Quant: 0.41 ug/mL-FEU (ref 0.00–0.50)

## 2020-02-17 MED ORDER — OXYCODONE-ACETAMINOPHEN 5-325 MG PO TABS
1.0000 | ORAL_TABLET | ORAL | 0 refills | Status: DC | PRN
Start: 2020-02-17 — End: 2020-08-26

## 2020-02-17 MED ORDER — MORPHINE SULFATE (PF) 4 MG/ML IV SOLN
4.0000 mg | Freq: Once | INTRAVENOUS | Status: AC
  Administered 2020-02-17: 4 mg via INTRAVENOUS
  Filled 2020-02-17: qty 1

## 2020-02-17 MED ORDER — FENTANYL CITRATE (PF) 100 MCG/2ML IJ SOLN
50.0000 ug | Freq: Once | INTRAMUSCULAR | Status: AC
  Administered 2020-02-17: 50 ug via INTRAVENOUS
  Filled 2020-02-17: qty 2

## 2020-02-17 NOTE — ED Notes (Signed)
Patient back from x-ray 

## 2020-02-17 NOTE — ED Notes (Signed)
Patient transported to X-ray 

## 2020-02-17 NOTE — ED Triage Notes (Signed)
Pt arrived via walk in, c/o reproducible chest pain, radiating to right arm. Worse with mvmt and deep breathing. States she needs to keep arm on pillow to stop pain in chest. Denies any cardiac hx.

## 2020-02-17 NOTE — Telephone Encounter (Signed)
Received VM from pt.  Attempt x1 to return call, no answer due to phone continuously ringing with no VM option.

## 2020-02-17 NOTE — ED Provider Notes (Signed)
[place] DEPT Provider Note   CSN: [id number] Arrival date & time: 02/17/20  1044     History Chief Complaint  Patient presents with  . Chest Pain    Ashley Lindsey is a 59 y.o. female.  The history is provided by the patient and medical records. No language interpreter was used.  Chest Pain Pain location:  R chest and R lateral chest Pain quality: aching and dull   Pain radiates to:  R shoulder Pain severity:  Severe Onset quality:  Sudden Timing:  Constant Progression:  Waxing and waning Chronicity:  New Context: lifting   Context: not trauma   Relieved by:  None tried Worsened by:  Movement and certain positions Ineffective treatments:  None tried (lidoerm and flexeril) Associated symptoms: back pain   Associated symptoms: no abdominal pain, no claudication, no cough, no fatigue, no fever, no headache, no lower extremity edema, no nausea, no palpitations, no shortness of breath, no vomiting and no weakness        Past Medical History:  Diagnosis Date  . Abdominal cramps   . Arthritis   . Cancer [facility])    s/p radiation- last dose 6/12//has been off Tamoxifen 8/12  . Chronic headaches   . Costochondritis   . Fatigue   . Fibromyalgia 03/06/2013  . Lymphedema    RIGHT ARM-////  STATES USE LEFT ARM FOR BP'S  . Migraines   . Muscle spasms of head and/or neck    following to the breast  . Passed out    4th of july  . Personal history of radiation therapy 2010   lt breast   . S/P radiation therapy 08/19/07 - 10/10/07   Left Breast/5040 cGy/28 fractions with Boost for a toatl dose of 6300 dGy  . S/P radiation therapy 08/14/10 -10/03/10   right breast  . Sleep apnea    STOP BANG SCORE 5    Patient Active Problem List   Diagnosis Date Noted  . Bilateral primary osteoarthritis of knee 02/09/2020  . Chronic intermittent hypoxia with obstructive sleep apnea 08/24/2019  . Chronic intractable headache 07/14/2019  . Morning headache  07/14/2019  . Loud snoring 07/14/2019  . Super obesity 07/14/2019  . Sleeps in sitting position due to orthopnea 07/14/2019  . Chest pain due to GERD 07/14/2019  . Nocturia more than twice per night 07/14/2019  . Unilateral primary osteoarthritis, right knee 05/26/2019  . Chronic migraine without aura without status migrainosus, not intractable 05/13/2019  . Class 3 severe obesity due to excess calories without serious comorbidity with body mass index (BMI) of 40.0 to 44.9 in adult ([place]) 08/05/2018  . Depression 08/05/2018  . Chronic nonintractable headache 08/05/2018  . Elevated blood-pressure reading without diagnosis of hypertension 06/06/2018  . Chronic pain of right ankle 04/24/2018  . Breast cancer of lower-outer quadrant of right female breast ([place]) 12/02/2014  . Bronchospasm 09/21/2014  . Dyspnea 09/21/2014  . Chronic pain of right knee 09/21/2014  . Fibromyalgia 03/06/2013  . Dense breasts 03/06/2013  . Mastalgia 03/06/2013  . Back pain, lumbosacral 03/06/2013  . Lymphedema of arm 03/06/2013  . Atypical chest pain 01/06/2013  . Breast cancer of upper-inner quadrant of left female breast ([place]) 09/08/2012  . Family history of malignant neoplasm of breast 09/08/2012  . S/P radiation therapy   . History of breast cancer in female 08/13/2011    Past Surgical History:  Procedure Laterality Date  . ABDOMINAL HYSTERECTOMY     with left salpingooophorectomy  .  BREAST LUMPECTOMY  2009/2012   LEFT/RIGHT with lymph node dissection  . KNEE ARTHROSCOPY     right  . OOPHORECTOMY  2013     OB History   No obstetric history on file.     Family History  Problem Relation Age of Onset  . Breast cancer Mother 39  . Heart disease Father   . Prostate cancer Maternal Uncle 78  . Cancer Maternal Aunt 63       d. from "female cancer" possibly cervical  . Breast cancer Maternal Aunt 60  . Prostate cancer Paternal Uncle   . Prostate cancer Maternal Grandfather 4  . Prostate  cancer Maternal Uncle 80  . Prostate cancer Paternal Uncle   . Breast cancer Cousin 30       mat 1st cousin    Social History   Tobacco Use  . Smoking status: Former Smoker    Packs/day: 0.25    Years: 1.00    Pack years: 0.25    Types: Cigarettes  . Smokeless tobacco: Never Used  Vaping Use  . Vaping Use: Never used  Substance Use Topics  . Alcohol use: Not Currently  . Drug use: No    Home Medications Prior to Admission medications   Medication Sig Start Date End Date Taking? Authorizing Provider  acetaminophen (TYLENOL) 650 MG CR tablet Take 650 mg by mouth every 8 (eight) hours as needed for pain.    [provider]  albuterol (VENTOLIN HFA) 108 (90 Base) MCG/ACT inhaler INHALE 1-2 PUFFS INTO THE LUNGS EVERY 6 (SIX) HOURS AS NEEDED FOR WHEEZING OR SHORTNESS OF BREATH. 11/02/19   Nicholas Lose, MD  Bisacodyl (DULCOLAX PO) Take 1 capsule by mouth daily as needed (constipation).     [provider]  budesonide-formoterol (SYMBICORT) 80-4.5 MCG/ACT inhaler Inhale 2 puffs into the lungs in the morning and at bedtime. 12/02/19   [name], FNP  cholecalciferol 5000 units TABS Take 1 tablet (5,000 Units total) by mouth 2 (two) times daily. Patient taking differently: Take 5,000 Units by mouth daily.  07/21/15   Nicholas Lose, MD  cyclobenzaprine (FLEXERIL) 10 MG tablet TAKE 1 TABLET THREE TIMES DAILY AS NEEDED FOR MUSCLE SPASMS 02/16/20   [name], FNP  DEXILANT 60 MG capsule TAKE 1 CAPSULE BY MOUTH EVERY DAY 10/19/19   [name], FNP  diphenhydrAMINE (BENADRYL) 25 mg capsule Take 25 mg by mouth 2 (two) times daily.    [provider]  Erenumab-aooe (AIMOVIG) 140 MG/ML SOAJ Inject 140 mg into the skin every 30 (thirty) days. 06/18/19   Melvenia Beam, MD  fish oil-omega-3 fatty acids 1000 MG capsule Take 1 g by mouth daily.     [provider]  Flaxseed, Linseed, (FLAXSEED OIL MAX STR PO) Take 1 capsule by mouth daily.     [provider]  gabapentin (NEURONTIN) 300 MG capsule Take 1 capsule (300 mg total) by mouth 3 (three) times daily. Medication may be dispensed to patient today. If you have questions, please call Dr. Ranell Patrick at [phone]. 10/01/19   Izora Ribas, MD  hydrochlorothiazide (HYDRODIURIL) 25 MG tablet Take 1 tablet (25 mg total) by mouth daily. 02/03/20   [name], FNP  Iron-FA-B Cmp-C-Biot-Probiotic (FUSION PLUS) CAPS Take 1 capsule by mouth daily. 06/23/18   [name], FNP  meloxicam (MOBIC) 7.5 MG tablet Take 1 tablet (7.5 mg total) by mouth daily. 02/16/20   [name], FNP  Multiple Vitamins-Minerals (MULTIVITAMIN & MINERAL PO) Take 1 tablet  by mouth daily.    [provider]  omeprazole (PRILOSEC) 20 MG capsule Take 20 mg by mouth daily.    [provider]  orphenadrine (NORFLEX) 100 MG tablet Take 1 tablet (100 mg total) by mouth 2 (two) times daily. 02/03/20 02/02/21  [name], FNP  promethazine (PHENERGAN) 25 MG tablet Take 1 tablet (25 mg total) by mouth every 6 (six) hours as needed for nausea or vomiting. 12/02/19   [name], FNP  propranolol (INDERAL) 20 MG tablet TAKE 1 TABLET BY MOUTH TWICE A DAY 12/23/19   [name], FNP  rizatriptan (MAXALT-MLT) 10 MG disintegrating tablet Take 1 tablet (10 mg total) by mouth as needed for migraine. May repeat in 2 hours if needed 07/01/19   Melvenia Beam, MD  senna (SENOKOT) 8.6 MG tablet Take 1 tablet by mouth daily as needed for constipation.     [provider]  traMADol (ULTRAM) 50 MG tablet Take 1 tablet by mouth up to twice daily as needed for pain. 06/12/19   Garald Balding, MD  traMADol (ULTRAM) 50 MG tablet Take 1 tablet (50 mg total) by mouth every 12 (twelve) hours as needed. 02/15/20   Garald Balding, MD  traMADol Veatrice Bourbon) 50 MG tablet 1 tablet PO BID prn 02/16/20   Garald Balding, MD  triamcinolone cream (KENALOG) 0.1 % Apply 1 application topically 2 (two) times daily.  Apply for up to one week. Patient taking differently: Apply 1 application topically 2 (two) times daily as needed (rash).  12/29/16   Vanessa Kick, MD  UNABLE TO FIND Rx: L8000-Post Surgical Bra (Quantity: 6) P7106- Silicone Breast Prosthesis (Quantity: 2) Dx: 174.9; Bilateral partial mastectomy Replacement due to hot flashes since previous items were too heavy and hot 01/20/13   Fanny Skates, MD  venlafaxine XR (EFFEXOR-XR) 150 MG 24 hr capsule TAKE 1 CAPSULE BY MOUTH SEE ADMIN INSTRUCTIONS. NIGHTLY WITH 75 MG CAPSULE, TOTAL OF 225 MG NIGHTLY 03/17/19   Nicholas Lose, MD  venlafaxine XR (EFFEXOR-XR) 75 MG 24 hr capsule TAKE 1 CAPSULE BY MOUTH DAILY IN COMBINATION WITH VENLAFAXINE XR 150 MG (TO EQUAL 225 MG TOTAL DAILY). 01/01/20   Nicholas Lose, MD  vitamin E 200 UNIT capsule Take 200 Units by mouth daily.    [provider]    Allergies    Aspirin and Penicillins  Review of Systems   Review of Systems  Constitutional: Negative for chills, fatigue and fever.  HENT: Negative for congestion.   Eyes: Negative for visual disturbance.  Respiratory: Negative for cough, chest tightness, shortness of breath and wheezing.   Cardiovascular: Positive for chest pain. Negative for palpitations and claudication.  Gastrointestinal: Negative for abdominal pain, nausea and vomiting.  Genitourinary: Negative for flank pain.  Musculoskeletal: Positive for back pain. Negative for neck pain and neck stiffness.  Skin: Negative for wound.  Neurological: Negative for weakness, light-headedness and headaches.  Psychiatric/Behavioral: Negative for confusion.  All other systems reviewed and are negative.   Physical Exam Updated Vital Signs BP 135/81 (BP Location: Right Arm)   Pulse 78   Temp 98.1 F (36.7 C) (Oral)   Resp 18   SpO2 100%   Physical Exam Vitals and nursing note reviewed.  Constitutional:      General: She is not in acute distress.    Appearance: She is well-developed. She is  not ill-appearing, toxic-appearing or diaphoretic.  HENT:     Head: Normocephalic and atraumatic.     Right Ear: External  ear normal.     Left Ear: External ear normal.     Nose: Nose normal.     Mouth/Throat:     Pharynx: No oropharyngeal exudate.  Eyes:     Conjunctiva/sclera: Conjunctivae normal.     Pupils: Pupils are equal, round, and reactive to light.  Cardiovascular:     Rate and Rhythm: Normal rate.  No extrasystoles are present.    Heart sounds: Normal heart sounds. No murmur heard.   Pulmonary:     Effort: No respiratory distress.     Breath sounds: No stridor. No decreased breath sounds, wheezing or rhonchi.  Chest:     Chest wall: Tenderness present.       Comments: Normal pulses, strength, sensation in upper extremities.  Tenderness and muscle spasms in the right pectoral muscles, right shoulder, and right upper back.  This provoked her discomfort with palpation.  No abscesses or palpated areas of fluctuance. Abdominal:     General: There is no distension.     Tenderness: There is no abdominal tenderness. There is no rebound.  Musculoskeletal:       Arms:     Cervical back: Normal range of motion and neck supple.     Right lower leg: No edema.     Left lower leg: No edema.  Skin:    General: Skin is warm.     Findings: No erythema or rash.  Neurological:     Mental Status: She is alert and oriented to person, place, and time.     Motor: No abnormal muscle tone.     Coordination: Coordination normal.     Deep Tendon Reflexes: Reflexes are normal and symmetric.     ED Results / Procedures / Treatments   Labs (all labs ordered are listed, but only abnormal results are displayed) Labs Reviewed  BASIC METABOLIC PANEL - Abnormal; Notable for the following components:      Result Value   Chloride 95 (*)    CO2 33 (*)    Glucose, Bld 103 (*)    All other components within normal limits  CBC - Abnormal; Notable for the following components:   MCHC 29.7 (*)      All other components within normal limits  HEPATIC FUNCTION PANEL  D-DIMER, QUANTITATIVE (NOT AT [facility])  MAGNESIUM  TROPONIN I (HIGH SENSITIVITY)  TROPONIN I (HIGH SENSITIVITY)    EKG EKG Interpretation  Date/Time:  Wednesday February 17 2020 10:55:11 EDT Ventricular Rate:  94 PR Interval:    QRS Duration: 90 QT Interval:  378 QTC Calculation: 473 R Axis:   40 Text Interpretation: Sinus rhythm 12 Lead; [place]ar When compared to prior, similar apperance. No STEMI Confirmed by Antony Blackbird [phone]) on 02/17/2020 11:18:20 AM   Radiology DG Chest 2 View  Result Date: 02/17/2020 CLINICAL DATA:  Chest pain. EXAM: CHEST - 2 VIEW COMPARISON:  None. FINDINGS: Cardiomediastinal silhouette is enlarged and likely accentuated by low lung volumes and AP technique. No focal consolidation. No visible pleural effusions or pneumothorax. The visualized skeletal structures are unremarkable. IMPRESSION: 1. No evidence of acute cardiopulmonary abnormality. 2. Apparent cardiomegaly, which is likely accentuated by AP technique and low lung volumes. Electronically Signed   By: Margaretha Sheffield MD   On: 02/17/2020 12:55    Procedures Procedures (including critical care time)  Medications Ordered in ED Medications  fentaNYL (SUBLIMAZE) injection 50 mcg (50 mcg Intravenous Given 02/17/20 1232)  morphine 4 MG/ML injection 4 mg (4 mg Intravenous Given 02/17/20 1409)  ED Course  I have reviewed the triage vital signs and the nursing notes.  Pertinent labs & imaging results that were available during my care of the patient were reviewed by me and considered in my medical decision making (see chart for details).    MDM Rules/Calculators/A&P                          Ashley Lindsey is a 59 y.o. female with a past medical history significant for prior breast cancer, fibromyalgia, muscle spasms, and migraines who presents with significant pain in her right chest, right back, and right shoulder.  She  reports that she lifted a heavy suitcase yesterday and started having pain overnight in the right shoulder and right triceps area.  She reports the pain does radiate into her anterior and posterior chest and shoulder blade area.  She denies shortness of breath but reports that the pain is worsened with deep breathing and with movement.  She denies numbness, tingling, or weakness in her arm.  She denies any focal trauma.  She denies any nausea, vomiting, diaphoresis, constipation, diarrhea, or urinary symptoms.  Denies any leg pain or leg swelling.  She has a history of cancer with radiation in the past.  On exam, lungs are clear and chest is tender palpation on the anterior and right chest as well as her right back and right shoulder.  She had intact pulses and normal grip strength and sensation in her arms.  She had no fluctuance to suggest abscess on exam.  Good pulses and strength in legs.  Normal sensation in legs.  Good pulses.  Abdomen nontender.  Midline back nontender.  Muscle spasms palpated on anterior chest and posterior chest and shoulder.  Clinical estimate was musculoskeletal provoked by lifting the heavy luggage however due to her history of cancer and the pain, will get work-up to look for pulmonary ballismus, pneumothorax, or other acute abnormality.  Anticipate reassessment after work-up.  Work-up returned reassuring.  Troponin negative x2.  D-dimer was negative, doubt PE.  Other labs similar to prior and reassuring.  Chest x-ray shows no pneumonia or pneumothorax.  Patient will better after pain medicine.  I suspect this is musculoskeletal in nature given the description of symptoms and the heavy lifting that provoked it.  Patient already takes Flexeril and Lidoderm so we will not add this to her regimen.  She did well with pain medicines we will give her prescription for some pain medicine to use over the next several days when needed.  Patient agrees with plans to follow with PCP and  follow-up instructions.  She was given good return precautions.  She understood plan of care no other questions or concerns.  She was discharged in good condition with improvement in her symptoms.   Final Clinical Impression(s) / ED Diagnoses Final diagnoses:  Other chest pain  Muscle spasm    Rx / DC Orders ED Discharge Orders         Ordered    oxyCODONE-acetaminophen (PERCOCET/ROXICET) 5-325 MG tablet  Every 4 hours PRN        02/17/20 1545         Clinical Impression: 1. Other chest pain   2. Muscle spasm     Disposition: Discharge  Condition: Good  I have discussed the results, Dx and Tx plan with the pt(& family if present). He/she/they expressed understanding and agree(s) with the plan. Discharge instructions discussed at great length. Strict  return precautions discussed and pt &/or family have verbalized understanding of the instructions. No further questions at time of discharge.    New Prescriptions   OXYCODONE-ACETAMINOPHEN (PERCOCET/ROXICET) 5-325 MG TABLET    Take 1 tablet by mouth every 4 (four) hours as needed for severe pain.    Follow Up: [name], [address] [address] [place] [id number] [phone] Schedule an appointment as soon as possible for a visit       Dylann Gallier, Gwenyth Allegra, MD 02/17/20 1651

## 2020-02-17 NOTE — Discharge Instructions (Addendum)
Your work-up today did not show evidence of acute heart injury or a lung cause of your symptoms.  Your blood clot test was negative and the x-ray shows no pneumonia.  I suspect musculoskeletal spasm and pain related to lifting the heavy luggage we discussed.  Your other work-up is reassuring.  Please rest and stay hydrated.  Please follow-up with your primary doctor.  If any symptoms change or worsen, please return to the nearest emergency department.

## 2020-02-22 NOTE — Telephone Encounter (Signed)
She can follow up with orthopedics. Does not need to come in for a visit at this time

## 2020-02-22 NOTE — Telephone Encounter (Signed)
Please advise pt YL,RMA

## 2020-02-23 ENCOUNTER — Other Ambulatory Visit: Payer: Self-pay | Admitting: Nurse Practitioner

## 2020-02-23 DIAGNOSIS — R110 Nausea: Secondary | ICD-10-CM

## 2020-02-25 ENCOUNTER — Ambulatory Visit (INDEPENDENT_AMBULATORY_CARE_PROVIDER_SITE_OTHER): Payer: Medicare Other | Admitting: Orthopaedic Surgery

## 2020-02-25 ENCOUNTER — Encounter: Payer: Self-pay | Admitting: Orthopaedic Surgery

## 2020-02-25 ENCOUNTER — Other Ambulatory Visit: Payer: Self-pay

## 2020-02-25 VITALS — Ht 61.0 in | Wt 312.0 lb

## 2020-02-25 DIAGNOSIS — M170 Bilateral primary osteoarthritis of knee: Secondary | ICD-10-CM | POA: Diagnosis not present

## 2020-02-25 MED ORDER — BUPIVACAINE HCL 0.5 % IJ SOLN
2.0000 mL | INTRAMUSCULAR | Status: AC | PRN
  Administered 2020-02-25: 2 mL via INTRA_ARTICULAR

## 2020-02-25 MED ORDER — LIDOCAINE HCL 1 % IJ SOLN
2.0000 mL | INTRAMUSCULAR | Status: AC | PRN
  Administered 2020-02-25: 2 mL

## 2020-02-25 NOTE — Progress Notes (Signed)
Office Visit Note   Patient: Ashley Lindsey           Date of Birth: [birth year]           MRN: [id number] Visit Date: 02/25/2020              Requested by: [name], [address] [place],  [place] [id number] PCP: [name], FNP   Assessment & Plan: Visit Diagnoses:  1. Bilateral primary osteoarthritis of knee     Plan: Several weeks status post cortisone injection right knee and has done well.  Like to have her left knee injected today.  Does have evidence of significant arthritis by prior films.  Still working on her weight.  BMI is approximately 42  Follow-Up Instructions: Return if symptoms worsen or fail to improve.   Orders:  Orders Placed This Encounter  Procedures  . Large Joint Inj: L knee   No orders of the defined types were placed in this encounter.     Procedures: Large Joint Inj: L knee on 02/25/2020 2:50 PM Indications: pain and diagnostic evaluation Details: 25 G 1.5 in needle, anteromedial approach  Arthrogram: No  Medications: 2 mL lidocaine 1 %; 2 mL bupivacaine 0.5 %   2 mL 2 mL betamethasone Procedure, treatment alternatives, risks and benefits explained, specific risks discussed. Consent was given by the patient. Patient was prepped and draped in the usual sterile fashion.       Clinical Data: No additional findings.   Subjective: Chief Complaint  Patient presents with  . Right Knee - Follow-up  . Left Knee - Follow-up  Patient presents today for follow up on her knees. She had her right knee injected on 02/09/2020. She said that she has noticed improvement. She is wanting to have her left knee injected today.   HPI  Review of Systems   Objective: Vital Signs: Ht 5\' 1"  (1.549 m)   Wt (!) 312 lb (141.5 kg)   BMI 58.95 kg/m   Physical Exam Constitutional:      Appearance: She is well-developed.  Eyes:     Pupils: Pupils are equal, round, and reactive to light.  Pulmonary:     Effort: Pulmonary effort  is normal.  Skin:    General: Skin is warm and dry.  Neurological:     Mental Status: She is alert and oriented to person, place, and time.  Psychiatric:        Behavior: Behavior normal.     Ortho Exam comfortable sitting.  Has a slight limp referable to her left knee.  Might have a very minimal effusion.  Mostly medial joint pain left knee today.  Full extension about 95 degrees of flexion at which point thigh touches calf.  Mild patella crepitation but no pain about the patella Specialty Comments:  No specialty comments available.  Imaging: No results found.   PMFS History: Patient Active Problem List   Diagnosis Date Noted  . Bilateral primary osteoarthritis of knee 02/09/2020  . Chronic intermittent hypoxia with obstructive sleep apnea 08/24/2019  . Chronic intractable headache 07/14/2019  . Morning headache 07/14/2019  . Loud snoring 07/14/2019  . Super obesity 07/14/2019  . Sleeps in sitting position due to orthopnea 07/14/2019  . Chest pain due to GERD 07/14/2019  . Nocturia more than twice per night 07/14/2019  . Unilateral primary osteoarthritis, right knee 05/26/2019  . Chronic migraine without aura without status migrainosus, not intractable 05/13/2019  . Class 3 severe obesity  due to excess calories without serious comorbidity with body mass index (BMI) of 40.0 to 44.9 in adult [facility]) 08/05/2018  . Depression 08/05/2018  . Chronic nonintractable headache 08/05/2018  . Elevated blood-pressure reading without diagnosis of hypertension 06/06/2018  . Chronic pain of right ankle 04/24/2018  . Breast cancer of lower-outer quadrant of right female breast ([place]) 12/02/2014  . Bronchospasm 09/21/2014  . Dyspnea 09/21/2014  . Chronic pain of right knee 09/21/2014  . Fibromyalgia 03/06/2013  . Dense breasts 03/06/2013  . Mastalgia 03/06/2013  . Back pain, lumbosacral 03/06/2013  . Lymphedema of arm 03/06/2013  . Atypical chest pain 01/06/2013  . Breast cancer of  upper-inner quadrant of left female breast ([place]) 09/08/2012  . Family history of malignant neoplasm of breast 09/08/2012  . S/P radiation therapy   . History of breast cancer in female 08/13/2011   Past Medical History:  Diagnosis Date  . Abdominal cramps   . Arthritis   . Cancer [facility])    s/p radiation- last dose 6/12//has been off Tamoxifen 8/12  . Chronic headaches   . Costochondritis   . Fatigue   . Fibromyalgia 03/06/2013  . Lymphedema    RIGHT ARM-////  STATES USE LEFT ARM FOR BP'S  . Migraines   . Muscle spasms of head and/or neck    following to the breast  . Passed out    4th of july  . Personal history of radiation therapy 2010   lt breast   . S/P radiation therapy 08/19/07 - 10/10/07   Left Breast/5040 cGy/28 fractions with Boost for a toatl dose of 6300 dGy  . S/P radiation therapy 08/14/10 -10/03/10   right breast  . Sleep apnea    STOP BANG SCORE 5    Family History  Problem Relation Age of Onset  . Breast cancer Mother 74  . Heart disease Father   . Prostate cancer Maternal Uncle 78  . Cancer Maternal Aunt 63       d. from "female cancer" possibly cervical  . Breast cancer Maternal Aunt 60  . Prostate cancer Paternal Uncle   . Prostate cancer Maternal Grandfather 69  . Prostate cancer Maternal Uncle 80  . Prostate cancer Paternal Uncle   . Breast cancer Cousin 16       mat 1st cousin    Past Surgical History:  Procedure Laterality Date  . ABDOMINAL HYSTERECTOMY     with left salpingooophorectomy  . BREAST LUMPECTOMY  2009/2012   LEFT/RIGHT with lymph node dissection  . KNEE ARTHROSCOPY     right  . OOPHORECTOMY  2013   Social History   Occupational History  . Occupation: disability  Tobacco Use  . Smoking status: Former Smoker    Packs/day: 0.25    Years: 1.00    Pack years: 0.25    Types: Cigarettes  . Smokeless tobacco: Never Used  Vaping Use  . Vaping Use: Never used  Substance and Sexual Activity  . Alcohol use: Not Currently  .  Drug use: No  . Sexual activity: Not Currently    Birth control/protection: Surgical

## 2020-03-01 ENCOUNTER — Other Ambulatory Visit: Payer: Self-pay | Admitting: Nurse Practitioner

## 2020-03-01 ENCOUNTER — Other Ambulatory Visit: Payer: Self-pay

## 2020-03-01 DIAGNOSIS — M797 Fibromyalgia: Secondary | ICD-10-CM

## 2020-03-01 MED ORDER — ONDANSETRON HCL 4 MG PO TABS: 4.0000 mg | ORAL_TABLET | Freq: Every day | ORAL | 1 refills | Status: DC | PRN

## 2020-03-01 MED ORDER — MELOXICAM 7.5 MG PO TABS: 7.5000 mg | ORAL_TABLET | Freq: Every day | ORAL | 0 refills | Status: DC

## 2020-03-02 ENCOUNTER — Ambulatory Visit: Payer: Medicare Other | Attending: Hematology and Oncology

## 2020-03-02 ENCOUNTER — Other Ambulatory Visit: Payer: Self-pay

## 2020-03-02 DIAGNOSIS — I890 Lymphedema, not elsewhere classified: Secondary | ICD-10-CM | POA: Insufficient documentation

## 2020-03-02 DIAGNOSIS — M79601 Pain in right arm: Secondary | ICD-10-CM

## 2020-03-02 DIAGNOSIS — M79602 Pain in left arm: Secondary | ICD-10-CM | POA: Diagnosis not present

## 2020-03-02 NOTE — Therapy (Signed)
[place], [place], [address] Phone: [phone]   Fax:  [phone]  Physical Therapy Evaluation  Patient Details  Name: Ashley Lindsey MRN: [id number] Date of Birth: 18-Jan-1961 Referring Provider (PT): Dr. Lindi Adie   Encounter Date: 03/02/2020   PT End of Session - 03/02/20 1132    Visit Number 1    Number of Visits 13    Date for PT Re-Evaluation 04/13/20    PT Start Time 1005    PT Stop Time 1059    PT Time Calculation (min) 54 min    Activity Tolerance Patient tolerated treatment well    Behavior During Therapy [name] for tasks assessed/performed           Past Medical History:  Diagnosis Date  . Abdominal cramps   . Arthritis   . Cancer [facility])    s/p radiation- last dose 6/12//has been off Tamoxifen 8/12  . Chronic headaches   . Costochondritis   . Fatigue   . Fibromyalgia 03/06/2013  . Lymphedema    RIGHT ARM-////  STATES USE LEFT ARM FOR BP'S  . Migraines   . Muscle spasms of head and/or neck    following to the breast  . Passed out    4th of july  . Personal history of radiation therapy 2010   lt breast   . S/P radiation therapy 08/19/07 - 10/10/07   Left Breast/5040 cGy/28 fractions with Boost for a toatl dose of 6300 dGy  . S/P radiation therapy 08/14/10 -10/03/10   right breast  . Sleep apnea    STOP BANG SCORE 5    Past Surgical History:  Procedure Laterality Date  . ABDOMINAL HYSTERECTOMY     with left salpingooophorectomy  . BREAST LUMPECTOMY  2009/2012   LEFT/RIGHT with lymph node dissection  . KNEE ARTHROSCOPY     right  . OOPHORECTOMY  2013    There were no vitals filed for this visit.    Subjective Assessment - 03/02/20 1015    Subjective Pain and swelling in both arms right greater than left presently.  Has a sleeve only for Right UE. Right arm and shoulder pain and  at axillary region and under breast and radiates down to elbow.  Worse with activity, and  raising arms up.Left arm hurts in the same place but not as much. Lifted a heavy suitcase Oct 20th and had pain under right breast.  Went to ER because she was having increased pain.  Told she had a bad tear on her chest wall. She is supposed to have a mammogram but doesn't think she can tolerate it now. She has a compression sleeve from 2014 that she wears 70% of the time .  She has gained 76 pounds over the last couple of years and this may be a result of long term prednisone use now discontinued, and may be reflected in arm circumference difference from 2019 measurements.   Pertinent History Left breast cancer diagnosed with lumpectomy with 9 lymphnodes removed in 2013; right breast cancer about six months later with lumpectomy and 13 lymphnodes removed in 2014. Diagnosed with fibromyalgia. Had a chemoradiation for both breasts at [facility]. HTN controlled with meds "at times." Ex-smoker.    Limitations Lifting;House hold activities;Other (comment)   bending forward to put on shoes   Patient Stated Goals Be able to wear sleeve prn and get some more exercises to have arms feel better    Currently in Pain? Yes  Pain Score 6     Pain Location Axilla    Pain Orientation Right;Anterior    Pain Descriptors / Indicators Aching    Pain Type Chronic pain    Pain Radiating Towards chest, and down arm    Effect of Pain on Daily Activities disturbed sleep, folding clothers, reaching, bending to put on shoes    Multiple Pain Sites Yes    Pain Score 0    Pain Location Axilla    Pain Orientation Left    Pain Descriptors / Indicators Aching    Pain Type Chronic pain              [facility] PT Assessment - 03/02/20 0001      Assessment   Medical Diagnosis left and right breast cancer in 2013-2014    Referring Provider (PT) Dr. Lindi Adie    Onset Date/Surgical Date --   2013-2014   Hand Dominance Right    Prior Therapy 1 visit      Precautions   Precaution Comments cancer precautions      Restrictions    Weight Bearing Restrictions No      Balance Screen   Has the patient fallen in the past 6 months Yes   fell from office chair when it moved   Has the patient had a decrease in activity level because of a fear of falling?  Yes    Is the patient reluctant to leave their home because of a fear of falling?  No      Prior Function   Level of Independence Independent    Vocation Retired    Leisure Higher education careers adviser   Overall Cognitive Status Within Functional Limits for tasks assessed      Observation/Other Assessments   Observations WNL      AROM   Right Shoulder Extension 36 Degrees    Right Shoulder Flexion 67 Degrees    Right Shoulder ABduction 53 Degrees    Left Shoulder Extension 38 Degrees    Left Shoulder Flexion 100 Degrees    Left Shoulder ABduction 78 Degrees             LYMPHEDEMA/ONCOLOGY QUESTIONNAIRE - 03/02/20 0001      Date Lymphedema/Swelling Started   Date 03/02/13      Treatment   Past Chemotherapy Treatment Yes    Past Radiation Treatment Yes      What other symptoms do you have   Are you having pitting edema Yes   hand   [place] Sign No      Right Upper Extremity Lymphedema   15 cm Proximal to Olecranon Process 47.8 cm    10 cm Proximal to Olecranon Process 44.2 cm    Olecranon Process 30.1 cm    15 cm Proximal to Ulnar Styloid Process 29 cm    10 cm Proximal to Ulnar Styloid Process 26.5 cm    Just Proximal to Ulnar Styloid Process 18.7 cm    Across Hand at [employer] 21 cm    At [place] of 2nd Digit 6.7 cm    At [facility] of Thumb 7 cm      Left Upper Extremity Lymphedema   15 cm Proximal to Olecranon Process 47.8 cm    10 cm Proximal to Olecranon Process 47 cm    Olecranon Process 33.1 cm    15 cm Proximal to Ulnar Styloid Process 29.2 cm    10 cm Proximal to Ulnar Styloid Process 25.6 cm  Just Proximal to Ulnar Styloid Process 19.9 cm    Across Hand at [employer] 20.4 cm    At [place] of 2nd Digit 7.1 cm    At [facility] of Thumb 7  cm                   Outpatient Rehab from 03/02/2020 in [place]  Lymphedema Life Impact Scale Total Score 57.35 %      Objective measurements completed on examination: See above findings.       [facility] Adult PT Treatment/Exercise - 03/02/20 0001      Ambulation/Gait   Ambulation/Gait Assistance 6: Modified independent (Device/Increase time)   bad knee arthritis   Assistive device None      Posture/Postural Control   Posture/Postural Control Postural limitations    Postural Limitations Rounded Shoulders;Forward head      Manual Therapy   Edema Management TG soft cut for left arm.  Advised to remove if hand swells.  Pt reported it felt good                    PT Short Term Goals - 03/02/20 1146      PT SHORT TERM GOAL #1   Title Independent with HEP    Time 4    Period Weeks    Status New             PT Long Term Goals - 03/02/20 1147      PT LONG TERM GOAL #1   Title Pt. will be independent with performing manual lymph drainage    Baseline 67,100    Time 6    Period --    Status --    Target Date 04/13/20      PT LONG TERM GOAL #2   Title Pt. will know where and how to obtain an appropriate compression sleeve and glove    Time 6    Target Date 04/13/20      PT LONG TERM GOAL #3   Title Pt. will be independent with HEP for bilateral shoulder ROM and strengthening    Time 4    Period Weeks    Status New    Target Date 03/30/20      PT LONG TERM GOAL #4   Title Bilateral shoulder flexion to at least 125 degrees for improved overhead reach    Baseline right 67,  left 100    Time 6    Period Weeks    Status New      PT LONG TERM GOAL #5   Title bilateral shoulder abduction to at least 120 degrees for improved ADLs    Time 6    Period Weeks    Status New      PT LONG TERM GOAL #6   Title LLIS score reduced to 23 or below    Baseline 57    Time 6    Period Weeks    Status New                   Plan - 03/02/20 1133    Clinical Impression Statement Pt has a history of bilateral brast Cancer surgeries the left in 2013 and the right in 2014 6 months later.  She had 9 nodes removed on the left and 13 on the right.  She had chemo and radiation.  She is complaining of bilateral arm pain greatest on the right with limitations in motion, and swelling  bilaterally that varies on a daily basis.  She wears a very old compression sleeve that gives her good relief.  She recently gained 76 lbs from being on long term prednisone use and this may be reflected in her measurements today.  Biggest complaint is pain and limitation in motion.  She also has fibromyalgia which could be contributing    Personal Factors and Comorbidities Comorbidity 3+    Comorbidities CA with LN removal, Lymphedema, bilateral shoulder pain, arthritis limiting activity, obesity    Examination-Activity Limitations Carry;Reach Overhead;Lift;Dressing;Sleep;Stand    Stability/Clinical Decision Making Stable/Uncomplicated    Clinical Decision Making Low    Rehab Potential Good    PT Frequency 2x / week    PT Duration 6 weeks    PT Treatment/Interventions ADLs/Self Care Home Management;DME Instruction;Therapeutic activities;Therapeutic exercise;Neuromuscular re-education;Patient/family education;Manual techniques;Manual lymph drainage;Compression bandaging;Passive range of motion    PT Next Visit Plan Pt is anxious for exercises to improve shoulder ROM/pain, PROM see if TG soft helped, MLD directing to inguinal Only,  Bilateral sleeves( most likely) vs bandaging.    Consulted and Agree with Plan of Care Patient           Patient will benefit from skilled therapeutic intervention in order to improve the following deficits and impairments:  Increased edema, Decreased range of motion, Impaired UE functional use, Decreased knowledge of precautions, Decreased activity tolerance, Impaired flexibility, Postural dysfunction,  Pain  Visit Diagnosis: Pain in right arm  Pain in left arm  Secondary lymphedema     Problem List Patient Active Problem List   Diagnosis Date Noted  . Bilateral primary osteoarthritis of knee 02/09/2020  . Chronic intermittent hypoxia with obstructive sleep apnea 08/24/2019  . Chronic intractable headache 07/14/2019  . Morning headache 07/14/2019  . Loud snoring 07/14/2019  . Super obesity 07/14/2019  . Sleeps in sitting position due to orthopnea 07/14/2019  . Chest pain due to GERD 07/14/2019  . Nocturia more than twice per night 07/14/2019  . Unilateral primary osteoarthritis, right knee 05/26/2019  . Chronic migraine without aura without status migrainosus, not intractable 05/13/2019  . Class 3 severe obesity due to excess calories without serious comorbidity with body mass index (BMI) of 40.0 to 44.9 in adult ([place]) 08/05/2018  . Depression 08/05/2018  . Chronic nonintractable headache 08/05/2018  . Elevated blood-pressure reading without diagnosis of hypertension 06/06/2018  . Chronic pain of right ankle 04/24/2018  . Breast cancer of lower-outer quadrant of right female breast ([place]) 12/02/2014  . Bronchospasm 09/21/2014  . Dyspnea 09/21/2014  . Chronic pain of right knee 09/21/2014  . Fibromyalgia 03/06/2013  . Dense breasts 03/06/2013  . Mastalgia 03/06/2013  . Back pain, lumbosacral 03/06/2013  . Lymphedema of arm 03/06/2013  . Atypical chest pain 01/06/2013  . Breast cancer of upper-inner quadrant of left female breast ([place]) 09/08/2012  . Family history of malignant neoplasm of breast 09/08/2012  . S/P radiation therapy   . History of breast cancer in female 08/13/2011    Claris Pong, PT 03/02/2020, 11:57 AM  [place], [place], [address] Phone: [phone]   Fax:  [phone]  Name: LILIANNA CASE MRN: [id number] Date of Birth: 1961-03-30

## 2020-03-04 ENCOUNTER — Other Ambulatory Visit: Payer: Self-pay | Admitting: Hematology and Oncology

## 2020-03-15 ENCOUNTER — Telehealth: Payer: Self-pay | Admitting: *Deleted

## 2020-03-15 NOTE — Telephone Encounter (Signed)
Received second VM from pt.  RN second attempt to contact pt.  No answer, LVM to return call to the office.

## 2020-03-15 NOTE — Telephone Encounter (Signed)
Received VM from pt.  Attempt x1 to return call.  No answer, LVM to return call to the office.  

## 2020-03-19 ENCOUNTER — Other Ambulatory Visit: Payer: Self-pay | Admitting: Hematology and Oncology

## 2020-03-23 ENCOUNTER — Other Ambulatory Visit: Payer: Self-pay

## 2020-03-23 ENCOUNTER — Ambulatory Visit: Payer: Medicare Other | Attending: Hematology and Oncology

## 2020-03-23 DIAGNOSIS — M79601 Pain in right arm: Secondary | ICD-10-CM

## 2020-03-23 DIAGNOSIS — M79602 Pain in left arm: Secondary | ICD-10-CM | POA: Diagnosis not present

## 2020-03-23 DIAGNOSIS — I890 Lymphedema, not elsewhere classified: Secondary | ICD-10-CM | POA: Diagnosis not present

## 2020-03-23 NOTE — Therapy (Signed)
[place], [place], [address] Phone: [phone]   Fax:  [phone]  Physical Therapy Treatment  Patient Details  Name: Ashley Lindsey MRN: [id number] Date of Birth: [birth year] Referring Provider (PT): Dr. Lindi Adie   Encounter Date: 03/23/2020   PT End of Session - 03/23/20 1129    Visit Number 2    Number of Visits 13    Date for PT Re-Evaluation 04/13/20    PT Start Time 1009    PT Stop Time 1125    PT Time Calculation (min) 76 min    Activity Tolerance Patient tolerated treatment well    Behavior During Therapy [facility] for tasks assessed/performed           Past Medical History:  Diagnosis Date  . Abdominal cramps   . Arthritis   . Cancer [facility])    s/p radiation- last dose 6/12//has been off Tamoxifen 8/12  . Chronic headaches   . Costochondritis   . Fatigue   . Fibromyalgia 03/06/2013  . Lymphedema    RIGHT ARM-////  STATES USE LEFT ARM FOR BP'S  . Migraines   . Muscle spasms of head and/or neck    following to the breast  . Passed out    4th of july  . Personal history of radiation therapy 2010   lt breast   . S/P radiation therapy 08/19/07 - 10/10/07   Left Breast/5040 cGy/28 fractions with Boost for a toatl dose of 6300 dGy  . S/P radiation therapy 08/14/10 -10/03/10   right breast  . Sleep apnea    STOP BANG SCORE 5    Past Surgical History:  Procedure Laterality Date  . ABDOMINAL HYSTERECTOMY     with left salpingooophorectomy  . BREAST LUMPECTOMY  2009/2012   LEFT/RIGHT with lymph node dissection  . KNEE ARTHROSCOPY     right  . OOPHORECTOMY  2013    There were no vitals filed for this visit.   Subjective Assessment - 03/23/20 1012    Subjective Today is a good day. My pain isn't bad. I'm just have some new pain at the back of my Rt upper arm. The soft sleeve she gave me last time was absolutely wonderful. I'd love to get another one. I'm going to see a nutrionist soon  to help me get on a better eating plan.    Pertinent History Left breast cancer diagnosed with lumpectomy with 9 lymphnodes removed in 2013; right breast cancer about six months later with lumpectomy and 13 lymphnodes removed in 2014. Diagnosed with fibromyalgia. Had a compression sleeve but doesn't know where it is. Had a chemoradiation for both breasts at [facility]. HTN controlled with meds "at times." Ex-smoker.    Patient Stated Goals Be able to wear sleeve prn and get some more exercises to have arms feel better    Currently in Pain? Yes    Pain Score 4     Pain Orientation Right;Upper    Pain Descriptors / Indicators Burning    Pain Type Acute pain    Pain Onset 1 to 4 weeks ago    Pain Frequency Intermittent    Aggravating Factors  just comes and goes    Pain Relieving Factors nothing                       Outpatient Rehab from 03/02/2020 in [place]  Lymphedema Life Impact Scale Total Score 57.35 %  [facility] Adult PT Treatment/Exercise - 03/23/20 0001      Shoulder Exercises: Supine   External Rotation AAROM;Right;Left;5 reps   done in sitting (forgot when supine) with dowel   External Rotation Limitations Tactile cues to keep elbow adducted    Flexion AAROM;Both;10 reps   with dowel   Flexion Limitations with dowel    ABduction AAROM;Right;Left;5 reps    ABduction Limitations VCs not to push into pain with Rt UE    Other Supine Exercises Fingers clasped behind head and abduct elbows 3x       Shoulder Exercises: Pulleys   Flexion 2 minutes    Flexion Limitations Pt returned therapist demo and tactile cues to decrease bil shoulder compensation    ABduction 2 minutes    ABduction Limitations Tactile cuing througout to decrease scapular compensation, pt able to correct though with consistent cuing      Shoulder Exercises: Therapy Ball   Flexion Both;10 reps   forward lean into end of stretch   Flexion Limitations  Tactile cuing intermittently to decrease bil scapular compensation      Shoulder Exercises: Stretch   Other Shoulder Stretches Bil UE neural stretch, but focused on Rt as pt had increased nerve tightness 3x, 5 sec holds      Manual Therapy   Manual Therapy Manual Lymphatic Drainage (MLD);Passive ROM    Edema Management TG soft cut for right arm, pt reports feeling much relief with wearing this    Manual Lymphatic Drainage (MLD) In Supine with HOB elevated: Short neck, 5 diaphragmatic breaths with cuing for correct technique for this, Rt inguinal nodes, Rt axillo-inguinal anastsomosis, then Rt upper arm and retraced all steps    Passive ROM In Supine with HOB elevated:to Rt shoulder into flexion and abduction with multiple VCs to relax throughout due to muscle guarding.                   PT Education - 03/23/20 1152    Education Details Supine dowel exercises and neural tension stretch    Person(s) Educated Patient    Methods Explanation;Demonstration;Handout    Comprehension Verbalized understanding;Returned demonstration;Need further instruction            PT Short Term Goals - 03/02/20 1146      PT SHORT TERM GOAL #1   Title Independent with HEP    Time 4    Period Weeks    Status New             PT Long Term Goals - 03/02/20 1147      PT LONG TERM GOAL #1   Title Pt. will be independent with performing manual lymph drainage    Baseline 67,100    Time 6    Period --    Status --    Target Date 04/13/20      PT LONG TERM GOAL #2   Title Pt. will know where and how to obtain an appropriate compression sleeve and glove    Time 6    Target Date 04/13/20      PT LONG TERM GOAL #3   Title Pt. will be independent with HEP for bilateral shoulder ROM and strengthening    Time 4    Period Weeks    Status New    Target Date 03/30/20      PT LONG TERM GOAL #4   Title Bilateral shoulder flexion to at least 125 degrees for improved overhead reach    Baseline  right  67,  left 100    Time 6    Period Weeks    Status New      PT LONG TERM GOAL #5   Title bilateral shoulder abduction to at least 120 degrees for improved ADLs    Time 6    Period Weeks    Status New      PT LONG TERM GOAL #6   Title LLIS score reduced to 23 or below    Baseline 57    Time 6    Period Weeks    Status New    Target Date 04/13/20                 Plan - 03/23/20 1142    Clinical Impression Statement First session of therapeutic exercise and manual therapy today. Pt did well with AA/ROM execises by being able to return correct tehcnique of decreasing scapular compensations with ROM exs, though did require multiple VCs to remind of this due to increased muscle guarding. Spent time during AA/ROM exs educating pt about importance of correct posture and working to decrease muscle guarding along with scapular compensations that she has been doing, unintentioanlly now, for awhile and how this is furhter increasing her pain and limitng her A/ROM. Pt was able to verbalize good understanding of this as well. Then focued on Rt UE for manual therapy as time allowed to begin manual lymph drainage of Rt upper arm and gentle P/ROM. She again required multiple VCs to relax due to involuntary muscle guarding but was able to do so and able to improve P/ROM by end of session due to this as well. Progressed HEP to include supine dowel exercises and neural tension stretch. Overall Pt reports her Rt UE feeling looser by end of session and much relief felt with TG soft on arm. She would like to continue coming only 1x/wk so encouraged her to work on paying attention to decreasing muscle guarding with ADLs and HEP. She vrebalized good understanding. She has also started aquatic therapy 3x/wk over the past month. Does not walk to do daily walking routine as she reports prolonged walking increases fibromyalgia and chronic knee pain. Pt is however, motivated to lose the weight she gained this  past year and will be seeing a nutrionist soon also to further promote this.    Personal Factors and Comorbidities Comorbidity 3+    Comorbidities CA with LN removal, Lymphedema, bilateral shoulder pain, arthritis limiting activity, obesity    Examination-Activity Limitations Carry;Reach Overhead;Lift;Dressing;Sleep;Stand    Stability/Clinical Decision Making Stable/Uncomplicated    Rehab Potential Good    PT Frequency 2x / week    PT Duration 6 weeks    PT Treatment/Interventions ADLs/Self Care Home Management;DME Instruction;Therapeutic activities;Therapeutic exercise;Neuromuscular re-education;Patient/family education;Manual techniques;Manual lymph drainage;Compression bandaging;Passive range of motion    PT Next Visit Plan Pt is anxious for exercises to improve shoulder ROM/pain so review HEP and progress as able, supine scapular series? could she toelrate doorway stretch?  PROM, MLD directing to inguinal Only,  Bilateral sleeves vs bandaging.    PT Home Exercise Plan Supine dowel exercises and neural tension stretch on wall    Consulted and Agree with Plan of Care Patient           Patient will benefit from skilled therapeutic intervention in order to improve the following deficits and impairments:  Increased edema, Decreased range of motion, Impaired UE functional use, Decreased knowledge of precautions, Decreased activity tolerance, Impaired flexibility, Postural dysfunction, Pain  Visit  Diagnosis: Pain in right arm  Pain in left arm  Secondary lymphedema     Problem List Patient Active Problem List   Diagnosis Date Noted  . Bilateral primary osteoarthritis of knee 02/09/2020  . Chronic intermittent hypoxia with obstructive sleep apnea 08/24/2019  . Chronic intractable headache 07/14/2019  . Morning headache 07/14/2019  . Loud snoring 07/14/2019  . Super obesity 07/14/2019  . Sleeps in sitting position due to orthopnea 07/14/2019  . Chest pain due to GERD 07/14/2019  .  Nocturia more than twice per night 07/14/2019  . Unilateral primary osteoarthritis, right knee 05/26/2019  . Chronic migraine without aura without status migrainosus, not intractable 05/13/2019  . Class 3 severe obesity due to excess calories without serious comorbidity with body mass index (BMI) of 40.0 to 44.9 in adult ([place]) 08/05/2018  . Depression 08/05/2018  . Chronic nonintractable headache 08/05/2018  . Elevated blood-pressure reading without diagnosis of hypertension 06/06/2018  . Chronic pain of right ankle 04/24/2018  . Breast cancer of lower-outer quadrant of right female breast ([place]) 12/02/2014  . Bronchospasm 09/21/2014  . Dyspnea 09/21/2014  . Chronic pain of right knee 09/21/2014  . Fibromyalgia 03/06/2013  . Dense breasts 03/06/2013  . Mastalgia 03/06/2013  . Back pain, lumbosacral 03/06/2013  . Lymphedema of arm 03/06/2013  . Atypical chest pain 01/06/2013  . Breast cancer of upper-inner quadrant of left female breast ([place]) 09/08/2012  . Family history of malignant neoplasm of breast 09/08/2012  . S/P radiation therapy   . History of breast cancer in female 08/13/2011    Otelia Limes, PTA 03/23/2020, 11:59 AM  [place], [place], [address] Phone: [phone]   Fax:  [phone]  Name: Ashley Lindsey MRN: [id number] Date of Birth: Oct 23, 1960

## 2020-03-23 NOTE — Patient Instructions (Signed)
SHOULDER: Flexion - Supine (Cane)        Cancer Rehab [phone]    Hold cane in both hands. Raise arms up overhead. Do not allow back to arch. Hold _5__ seconds. Do __5-10__ times; __1-2__ times a day.   SELF ASSISTED WITH OBJECT: Shoulder Abduction / Adduction - Supine    Hold cane with both hands. Move both arms from side to side, keep elbows straight.  Hold when stretch felt for __5__ seconds. Repeat __5-10__ times; __1-2__ times a day. Once this becomes easier progress to third picture bringing affected arm towards ear by staying out to side. Same hold for _5_seconds. Repeat  _5-10_ times, _1-2_ times/day.  Shoulder Blade Stretch    Clasp fingers behind head with elbows touching in front of face. Pull elbows back while pressing shoulder blades together. Relax and hold as tolerated, can place pillow under elbow here for comfort as needed and to allow for prolonged stretch.  Repeat __5__ times. Do __1-2__ sessions per day.     SHOULDER: External Rotation - Supine (Cane)    Hold cane with both hands. Rotate arm away from body. Keep elbow on floor and next to body. _5-10__ reps per set, hold 5 seconds, _1-2__ sets per day. Add towel to keep elbow at side.   CARPAL TUNNEL (Nerve Compression Syndrome): Horizontal Adductor Stretch (Standing)    Place right hand on wall. Inhaling, turn torso toward left. Hold position for _5__ seconds. Bend elbow to rest, then repeat.  Repeat _3-5__ times. Repeat with left hand on wall. Do _2-3__ times per day.

## 2020-03-30 ENCOUNTER — Ambulatory Visit: Payer: Medicare Other | Admitting: Physical Therapy

## 2020-04-04 ENCOUNTER — Other Ambulatory Visit: Payer: Self-pay | Admitting: Nurse Practitioner

## 2020-04-04 DIAGNOSIS — C50419 Malignant neoplasm of upper-outer quadrant of unspecified female breast: Secondary | ICD-10-CM

## 2020-04-04 DIAGNOSIS — M797 Fibromyalgia: Secondary | ICD-10-CM

## 2020-04-06 ENCOUNTER — Ambulatory Visit: Payer: Medicare Other | Admitting: Physical Therapy

## 2020-04-08 ENCOUNTER — Encounter: Payer: Self-pay | Admitting: Nurse Practitioner

## 2020-04-09 ENCOUNTER — Other Ambulatory Visit: Payer: Self-pay | Admitting: Nurse Practitioner

## 2020-04-09 DIAGNOSIS — Z6841 Body Mass Index (BMI) 40.0 and over, adult: Secondary | ICD-10-CM

## 2020-04-09 DIAGNOSIS — G8929 Other chronic pain: Secondary | ICD-10-CM

## 2020-04-09 DIAGNOSIS — R519 Headache, unspecified: Secondary | ICD-10-CM

## 2020-04-09 DIAGNOSIS — F32A Depression, unspecified: Secondary | ICD-10-CM

## 2020-04-13 ENCOUNTER — Ambulatory Visit: Payer: Self-pay

## 2020-04-13 ENCOUNTER — Ambulatory Visit (INDEPENDENT_AMBULATORY_CARE_PROVIDER_SITE_OTHER): Payer: Medicare Other | Admitting: Orthopaedic Surgery

## 2020-04-13 ENCOUNTER — Encounter: Payer: Self-pay | Admitting: Orthopaedic Surgery

## 2020-04-13 ENCOUNTER — Other Ambulatory Visit: Payer: Self-pay

## 2020-04-13 DIAGNOSIS — G8929 Other chronic pain: Secondary | ICD-10-CM

## 2020-04-13 DIAGNOSIS — E669 Obesity, unspecified: Secondary | ICD-10-CM

## 2020-04-13 DIAGNOSIS — M170 Bilateral primary osteoarthritis of knee: Secondary | ICD-10-CM | POA: Diagnosis not present

## 2020-04-13 DIAGNOSIS — M545 Low back pain, unspecified: Secondary | ICD-10-CM | POA: Diagnosis not present

## 2020-04-13 MED ORDER — BUPIVACAINE HCL 0.5 % IJ SOLN
2.0000 mL | INTRAMUSCULAR | Status: AC | PRN
  Administered 2020-04-13: 2 mL via INTRA_ARTICULAR

## 2020-04-13 MED ORDER — LIDOCAINE HCL 1 % IJ SOLN
2.0000 mL | INTRAMUSCULAR | Status: AC | PRN
  Administered 2020-04-13: 2 mL

## 2020-04-13 MED ORDER — TRAMADOL HCL 50 MG PO TABS
50.0000 mg | ORAL_TABLET | Freq: Two times a day (BID) | ORAL | 0 refills | Status: DC | PRN
Start: 2020-04-13 — End: 2020-09-22

## 2020-04-13 NOTE — Progress Notes (Signed)
Office Visit Note   Patient: Ashley Lindsey           Date of Birth: November 24, 1960           MRN: [id number] Visit Date: 04/13/2020              Requested by: [name], [place],   [id number] PCP: [name], FNP   Assessment & Plan: Visit Diagnoses:  1. Chronic midline low back pain, unspecified whether sciatica present   2. Bilateral primary osteoarthritis of knee   3. Back pain, lumbosacral   4. Super obesity     Plan: Will reinject osteoarthritic left knee with betamethasone today.  Long discussion regarding the problem with her knee.  She has end-stage osteoarthritis with bone-on-bone in the medial compartment.  She is not a candidate for knee replacement surgery based on her weight.  She has had evaluation for possible lap band procedure but she is somewhat hesitant to pursue that.  She is also aware of the chronic problem with her knee with the persistent pain. The lumbar spine reveals some minimal degenerative changes and possible slight anterior listhesis of L5 on S1.  I think a course of physical therapy would be helpful.  We had a long discussion regarding her pain.  She does have fibromyalgia which certainly can exacerbate any of her symptoms.  I will give her prescription for tramadol but noted that I will not be giving this to her on a long-term basis and that she would need to follow-up with her fibromyalgia doctor or even consider a pain clinic  Follow-Up Instructions: Return if symptoms worsen or fail to improve.   Orders:  Orders Placed This Encounter  Procedures  . Large Joint Inj: L knee  . XR Lumbar Spine 2-3 Views   No orders of the defined types were placed in this encounter.     Procedures: Large Joint Inj: L knee on 04/13/2020 10:56 AM Indications: pain and diagnostic evaluation Details: 25 G 1.5 in needle, anteromedial approach  Arthrogram: No  Medications: 2 mL lidocaine 1 %; 2 mL bupivacaine 0.5 %  2 mL  betamethasone injected into left knee with Xylocaine and Depo-Medrol Procedure, treatment alternatives, risks and benefits explained, specific risks discussed. Consent was given by the patient. Patient was prepped and draped in the usual sterile fashion.       Clinical Data: No additional findings.   Subjective: Chief Complaint  Patient presents with  . Left Knee - Pain  . Lower Back - Pain  Patient presents today for lower back and left knee pain. She had her left knee injected on 02/25/2020. She states that both knees are painful, but the left side is worse. She is hoping to get another injection today.  She also has complaints of lower back pain. She said that it has been hurting for two months now. No known injury. Her pain is across her lower back. This pain occurs with any prolonged standing or walking. She has numbness, tingling, and weakness in both legs. She has been taking Tylenol and Flexeril, with no relief.  Has a history of fibromyalgia  HPI  Review of Systems   Objective: Vital Signs: There were no vitals taken for this visit.  Physical Exam Constitutional:      Appearance: She is well-developed and well-nourished.  HENT:     Mouth/Throat:     Mouth: Oropharynx is clear and moist.  Eyes:  Extraocular Movements: EOM normal.     Pupils: Pupils are equal, round, and reactive to light.  Pulmonary:     Effort: Pulmonary effort is normal.  Skin:    General: Skin is warm and dry.  Neurological:     Mental Status: She is alert and oriented to person, place, and time.  Psychiatric:        Mood and Affect: Mood and affect normal.        Behavior: Behavior normal.   BMI 59  Ortho Exam left knee had some medial joint discomfort.  Lacks just a few degrees of full extension of flexed about 95 degrees without instability.  Difficult to tell whether she had an effusion.  Some patella crepitation. Straight leg raise negative bilaterally.  Motor exam intact.  Some  percussible tenderness lower spine.  No pain with range of motion of either hip.  Multiple areas of percussible tenderness in the lumbar spine  Specialty Comments:  No specialty comments available.  Imaging: No results found.   PMFS History: Patient Active Problem List   Diagnosis Date Noted  . Bilateral primary osteoarthritis of knee 02/09/2020  . Chronic intermittent hypoxia with obstructive sleep apnea 08/24/2019  . Chronic intractable headache 07/14/2019  . Morning headache 07/14/2019  . Loud snoring 07/14/2019  . Super obesity 07/14/2019  . Sleeps in sitting position due to orthopnea 07/14/2019  . Chest pain due to GERD 07/14/2019  . Nocturia more than twice per night 07/14/2019  . Unilateral primary osteoarthritis, right knee 05/26/2019  . Chronic migraine without aura without status migrainosus, not intractable 05/13/2019  . Class 3 severe obesity due to excess calories without serious comorbidity with body mass index (BMI) of 40.0 to 44.9 in adult ([place]) 08/05/2018  . Depression 08/05/2018  . Chronic nonintractable headache 08/05/2018  . Elevated blood-pressure reading without diagnosis of hypertension 06/06/2018  . Chronic pain of right ankle 04/24/2018  . Breast cancer of lower-outer quadrant of right female breast ([place]) 12/02/2014  . Bronchospasm 09/21/2014  . Dyspnea 09/21/2014  . Chronic pain of right knee 09/21/2014  . Fibromyalgia 03/06/2013  . Dense breasts 03/06/2013  . Mastalgia 03/06/2013  . Back pain, lumbosacral 03/06/2013  . Lymphedema of arm 03/06/2013  . Atypical chest pain 01/06/2013  . Breast cancer of upper-inner quadrant of left female breast ([place]) 09/08/2012  . Family history of malignant neoplasm of breast 09/08/2012  . S/P radiation therapy   . History of breast cancer in female 08/13/2011   Past Medical History:  Diagnosis Date  . Abdominal cramps   . Arthritis   . Cancer [facility])    s/p radiation- last dose 6/12//has been off Tamoxifen 8/12   . Chronic headaches   . Costochondritis   . Fatigue   . Fibromyalgia 03/06/2013  . Lymphedema    RIGHT ARM-////  STATES USE LEFT ARM FOR BP'S  . Migraines   . Muscle spasms of head and/or neck    following to the breast  . Passed out    4th of july  . Personal history of radiation therapy 2010   lt breast   . S/P radiation therapy 08/19/07 - 10/10/07   Left Breast/5040 cGy/28 fractions with Boost for a toatl dose of 6300 dGy  . S/P radiation therapy 08/14/10 -10/03/10   right breast  . Sleep apnea    STOP BANG SCORE 5    Family History  Problem Relation Age of Onset  . Breast cancer Mother 47  . Heart disease  Father   . Prostate cancer Maternal Uncle 78  . Cancer Maternal Aunt 63       d. from "female cancer" possibly cervical  . Breast cancer Maternal Aunt 60  . Prostate cancer Paternal Uncle   . Prostate cancer Maternal Grandfather 7  . Prostate cancer Maternal Uncle 80  . Prostate cancer Paternal Uncle   . Breast cancer Cousin 43       mat 1st cousin    Past Surgical History:  Procedure Laterality Date  . ABDOMINAL HYSTERECTOMY     with left salpingooophorectomy  . BREAST LUMPECTOMY  2009/2012   LEFT/RIGHT with lymph node dissection  . KNEE ARTHROSCOPY     right  . OOPHORECTOMY  2013   Social History   Occupational History  . Occupation: disability  Tobacco Use  . Smoking status: Former Smoker    Packs/day: 0.25    Years: 1.00    Pack years: 0.25    Types: Cigarettes  . Smokeless tobacco: Never Used  Vaping Use  . Vaping Use: Never used  Substance and Sexual Activity  . Alcohol use: Not Currently  . Drug use: No  . Sexual activity: Not Currently    Birth control/protection: Surgical

## 2020-04-21 DIAGNOSIS — C50912 Malignant neoplasm of unspecified site of left female breast: Secondary | ICD-10-CM | POA: Diagnosis not present

## 2020-04-24 ENCOUNTER — Other Ambulatory Visit: Payer: Self-pay | Admitting: Nurse Practitioner

## 2020-04-24 DIAGNOSIS — R030 Elevated blood-pressure reading, without diagnosis of hypertension: Secondary | ICD-10-CM

## 2020-04-27 ENCOUNTER — Other Ambulatory Visit: Payer: Medicare Other

## 2020-04-28 ENCOUNTER — Encounter: Payer: Self-pay | Admitting: Neurology

## 2020-04-28 ENCOUNTER — Encounter: Payer: Self-pay | Admitting: Nurse Practitioner

## 2020-04-28 ENCOUNTER — Other Ambulatory Visit: Payer: Medicare Other

## 2020-04-28 DIAGNOSIS — Z20822 Contact with and (suspected) exposure to covid-19: Secondary | ICD-10-CM

## 2020-04-29 ENCOUNTER — Encounter: Payer: Self-pay | Admitting: Nurse Practitioner

## 2020-04-30 LAB — NOVEL CORONAVIRUS, NAA: SARS-CoV-2, NAA: NOT DETECTED

## 2020-04-30 LAB — SARS-COV-2, NAA 2 DAY TAT

## 2020-05-02 ENCOUNTER — Ambulatory Visit (INDEPENDENT_AMBULATORY_CARE_PROVIDER_SITE_OTHER): Payer: Medicare Other | Admitting: Nurse Practitioner

## 2020-05-02 ENCOUNTER — Other Ambulatory Visit: Payer: Self-pay | Admitting: Nurse Practitioner

## 2020-05-02 ENCOUNTER — Encounter: Payer: Self-pay | Admitting: Nurse Practitioner

## 2020-05-02 ENCOUNTER — Other Ambulatory Visit: Payer: Self-pay

## 2020-05-02 ENCOUNTER — Other Ambulatory Visit: Payer: Self-pay | Admitting: Hematology and Oncology

## 2020-05-02 VITALS — BP 134/80 | HR 90 | Temp 98.6°F | Ht 61.4 in | Wt 301.8 lb

## 2020-05-02 DIAGNOSIS — R52 Pain, unspecified: Secondary | ICD-10-CM

## 2020-05-02 DIAGNOSIS — R0600 Dyspnea, unspecified: Secondary | ICD-10-CM | POA: Diagnosis not present

## 2020-05-02 DIAGNOSIS — R059 Cough, unspecified: Secondary | ICD-10-CM | POA: Diagnosis not present

## 2020-05-02 DIAGNOSIS — R0609 Other forms of dyspnea: Secondary | ICD-10-CM

## 2020-05-02 DIAGNOSIS — R350 Frequency of micturition: Secondary | ICD-10-CM | POA: Diagnosis not present

## 2020-05-02 DIAGNOSIS — R82998 Other abnormal findings in urine: Secondary | ICD-10-CM

## 2020-05-02 DIAGNOSIS — C50419 Malignant neoplasm of upper-outer quadrant of unspecified female breast: Secondary | ICD-10-CM

## 2020-05-02 LAB — POCT URINALYSIS DIPSTICK
Bilirubin, UA: NEGATIVE
Glucose, UA: NEGATIVE
Ketones, UA: NEGATIVE
Nitrite, UA: NEGATIVE
Protein, UA: POSITIVE — AB
Spec Grav, UA: 1.025 (ref 1.010–1.025)
Urobilinogen, UA: 0.2 E.U./dL
pH, UA: 7.5 (ref 5.0–8.0)

## 2020-05-02 MED ORDER — BENZONATATE 100 MG PO CAPS: 100.0000 mg | ORAL_CAPSULE | Freq: Four times a day (QID) | ORAL | 1 refills | Status: DC | PRN

## 2020-05-02 MED ORDER — AZITHROMYCIN 250 MG PO TABS: ORAL_TABLET | ORAL | 0 refills | Status: AC

## 2020-05-02 NOTE — Patient Instructions (Signed)
If having difficulty with breathing at night encouraged to sleep on your stomach.  Continue with symptomatic treatment

## 2020-05-02 NOTE — Progress Notes (Signed)
Rutherford Nail as a scribe for [name], FNP.,have documented all relevant documentation on the behalf of [name], FNP,as directed by  [name], FNP while in the presence of [name], [place]. This visit occurred during the SARS-CoV-2 public health emergency.  Safety protocols were in place, including screening questions prior to the visit, additional usage of staff PPE, and extensive cleaning of exam room while observing appropriate contact time as indicated for disinfecting solutions.  Subjective:     Patient ID: Ashley Lindsey , female    DOB: 1961-03-26 , 60 y.o.   MRN: [id number]   Chief Complaint  Patient presents with  . Urinary Frequency    Patient stated she has been going to the bathroom more frequency since Friday. She stated she has chills she denies having any other symptoms.     HPI  Pt here today for UTI. She reports cloudy urine and has a sensation at the end of her urine stream.    She continues to have body aches, chills, headache. Denies runny nose. She does have coughing at night.  Decreased appetite for the last 2 days. She has not lost her taste or smell. She has not been able to sleep the last few nights due to being hot and cold. She is having shortness of breath after walking.  No medications for coughing and took zyrtec yesterday.     Past Medical History:  Diagnosis Date  . Abdominal cramps   . Arthritis   . Cancer [facility])    s/p radiation- last dose 6/12//has been off Tamoxifen 8/12  . Chronic headaches   . Costochondritis   . Fatigue   . Fibromyalgia 03/06/2013  . Lymphedema    RIGHT ARM-////  STATES USE LEFT ARM FOR BP'S  . Migraines   . Muscle spasms of head and/or neck    following to the breast  . Passed out    4th of july  . Personal history of radiation therapy 2010   lt breast   . S/P radiation therapy 08/19/07 - 10/10/07   Left Breast/5040 cGy/28 fractions with Boost for a toatl dose of 6300 dGy  . S/P radiation  therapy 08/14/10 -10/03/10   right breast  . Sleep apnea    STOP BANG SCORE 5     Family History  Problem Relation Age of Onset  . Breast cancer Mother 59  . Heart disease Father   . Prostate cancer Maternal Uncle 78  . Cancer Maternal Aunt 63       d. from "female cancer" possibly cervical  . Breast cancer Maternal Aunt 60  . Prostate cancer Paternal Uncle   . Prostate cancer Maternal Grandfather 57  . Prostate cancer Maternal Uncle 80  . Prostate cancer Paternal Uncle   . Breast cancer Cousin 38       mat 1st cousin     Current Outpatient Medications:  .  acetaminophen (TYLENOL) 650 MG CR tablet, Take 650 mg by mouth every 8 (eight) hours as needed for pain., Disp: , Rfl:  .  albuterol (VENTOLIN HFA) 108 (90 Base) MCG/ACT inhaler, INHALE 1-2 PUFFS INTO THE LUNGS EVERY 6 (SIX) HOURS AS NEEDED FOR WHEEZING OR SHORTNESS OF BREATH., Disp: 6.7 g, Rfl: 1 .  azithromycin (ZITHROMAX) 250 MG tablet, Take 2 tablets (500 mg) on  Day 1,  followed by 1 tablet (250 mg) once daily on Days 2 through 5., Disp: 6 each, Rfl: 0 .  benzonatate (TESSALON PERLES) 100 MG  capsule, Take 1 capsule (100 mg total) by mouth every 6 (six) hours as needed for cough., Disp: 30 capsule, Rfl: 1 .  Bisacodyl (DULCOLAX PO), Take 1 capsule by mouth daily as needed (constipation). , Disp: , Rfl:  .  budesonide-formoterol (SYMBICORT) 80-4.5 MCG/ACT inhaler, Inhale 2 puffs into the lungs in the morning and at bedtime., Disp: 30.6 g, Rfl: 1 .  cholecalciferol 5000 units TABS, Take 1 tablet (5,000 Units total) by mouth 2 (two) times daily. (Patient taking differently: Take 5,000 Units by mouth daily.), Disp: , Rfl:  .  cyclobenzaprine (FLEXERIL) 10 MG tablet, TAKE 1 TABLET THREE TIMES DAILY AS NEEDED FOR MUSCLE SPASMS, Disp: 90 tablet, Rfl: 0 .  DEXILANT 60 MG capsule, TAKE 1 CAPSULE BY MOUTH EVERY DAY, Disp: 90 capsule, Rfl: 1 .  diphenhydrAMINE (BENADRYL) 25 mg capsule, Take 25 mg by mouth 2 (two) times daily., Disp: , Rfl:   .  Erenumab-aooe (AIMOVIG) 140 MG/ML SOAJ, Inject 140 mg into the skin every 30 (thirty) days., Disp: 1 pen, Rfl: 11 .  fish oil-omega-3 fatty acids 1000 MG capsule, Take 1 g by mouth daily., Disp: , Rfl:  .  Flaxseed, Linseed, (FLAXSEED OIL MAX STR PO), Take 1 capsule by mouth daily. , Disp: , Rfl:  .  gabapentin (NEURONTIN) 300 MG capsule, Take 1 capsule (300 mg total) by mouth 3 (three) times daily. Medication may be dispensed to patient today. If you have questions, please call Dr. Ranell Patrick at [phone]., Disp: 90 capsule, Rfl: 3 .  hydrochlorothiazide (HYDRODIURIL) 12.5 MG tablet, TAKE 1 TABLET BY MOUTH EVERY DAY, Disp: 90 tablet, Rfl: 0 .  hydrochlorothiazide (HYDRODIURIL) 25 MG tablet, Take 1 tablet (25 mg total) by mouth daily., Disp: 90 tablet, Rfl: 1 .  Iron-FA-B Cmp-C-Biot-Probiotic (FUSION PLUS) CAPS, Take 1 capsule by mouth daily., Disp: 30 capsule, Rfl: 3 .  meloxicam (MOBIC) 7.5 MG tablet, TAKE 1 TABLET BY MOUTH EVERY DAY, Disp: 90 tablet, Rfl: 0 .  Multiple Vitamins-Minerals (MULTIVITAMIN & MINERAL PO), Take 1 tablet by mouth daily., Disp: , Rfl:  .  omeprazole (PRILOSEC) 20 MG capsule, Take 20 mg by mouth daily., Disp: , Rfl:  .  ondansetron (ZOFRAN) 4 MG tablet, Take 1 tablet (4 mg total) by mouth daily as needed for nausea or vomiting., Disp: 30 tablet, Rfl: 1 .  orphenadrine (NORFLEX) 100 MG tablet, Take 1 tablet (100 mg total) by mouth 2 (two) times daily., Disp: 60 tablet, Rfl: 1 .  oxyCODONE-acetaminophen (PERCOCET/ROXICET) 5-325 MG tablet, Take 1 tablet by mouth every 4 (four) hours as needed for severe pain., Disp: 15 tablet, Rfl: 0 .  promethazine (PHENERGAN) 25 MG tablet, Take 1 tablet (25 mg total) by mouth every 6 (six) hours as needed for nausea or vomiting., Disp: 30 tablet, Rfl: 0 .  propranolol (INDERAL) 20 MG tablet, TAKE 1 TABLET BY MOUTH TWICE A DAY, Disp: 180 tablet, Rfl: 0 .  rizatriptan (MAXALT-MLT) 10 MG disintegrating tablet, Take 1 tablet (10 mg total) by  mouth as needed for migraine. May repeat in 2 hours if needed, Disp: 9 tablet, Rfl: 11 .  senna (SENOKOT) 8.6 MG tablet, Take 1 tablet by mouth daily as needed for constipation. , Disp: , Rfl:  .  traMADol (ULTRAM) 50 MG tablet, Take 1 tablet by mouth up to twice daily as needed for pain., Disp: 30 tablet, Rfl: 0 .  traMADol (ULTRAM) 50 MG tablet, Take 1 tablet (50 mg total) by mouth every 12 (twelve) hours as needed.,  Disp: 30 tablet, Rfl: 0 .  traMADol (ULTRAM) 50 MG tablet, 1 tablet PO BID prn, Disp: 30 tablet, Rfl: 0 .  traMADol (ULTRAM) 50 MG tablet, Take 1 tablet (50 mg total) by mouth 2 (two) times daily as needed., Disp: 30 tablet, Rfl: 0 .  triamcinolone cream (KENALOG) 0.1 %, Apply 1 application topically 2 (two) times daily. Apply for up to one week. (Patient taking differently: Apply 1 application topically 2 (two) times daily as needed (rash).), Disp: 30 g, Rfl: 0 .  UNABLE TO FIND, Rx: L8000-Post Surgical Bra (Quantity: 6) [id number]- Silicone Breast Prosthesis (Quantity: 2) Dx: 174.9; Bilateral partial mastectomy Replacement due to hot flashes since previous items were too heavy and hot, Disp: 1 each, Rfl: 0 .  venlafaxine XR (EFFEXOR-XR) 150 MG 24 hr capsule, TAKE 1 CAPSULE BY MOUTH SEE ADMIN INSTRUCTIONS. NIGHTLY WITH 75 MG CAPSULE, TOTAL OF 225 MG NIGHTLY, Disp: 90 capsule, Rfl: 0 .  venlafaxine XR (EFFEXOR-XR) 75 MG 24 hr capsule, TAKE 1 CAPSULE BY MOUTH DAILY IN COMBINATION WITH XR 150 MG (TO EQUAL 225 MG TOTAL DAILY)., Disp: 90 capsule, Rfl: 0 .  vitamin E 200 UNIT capsule, Take 200 Units by mouth daily., Disp: , Rfl:    Allergies  Allergen Reactions  . Aspirin     unknown  . Penicillins Hives and Swelling    Has patient had a PCN reaction causing immediate rash, facial/tongue/throat swelling, SOB or lightheadedness with hypotension: Yes Has patient had a PCN reaction causing severe rash involving mucus membranes or skin necrosis: Yes Has patient had a PCN reaction that required  hospitalization Yes Has patient had a PCN reaction occurring within the last 10 years: No If all of the above answers are "NO", then may proceed with Cephalosporin use.      Review of Systems  Constitutional: Positive for chills. Negative for fatigue.  Respiratory: Positive for cough (at night). Negative for chest tightness and wheezing.   Cardiovascular: Negative.  Negative for chest pain, palpitations and leg swelling.  Gastrointestinal: Negative.   Musculoskeletal: Positive for myalgias. Negative for arthralgias.  Neurological: Positive for headaches. Negative for dizziness.  Psychiatric/Behavioral: Negative.      Today's Vitals   05/02/20 1423  BP: 134/80  Pulse: 90  Temp: 98.6 F (37 C)  TempSrc: Oral  SpO2: 96%  Weight: (!) 301 lb 12.8 oz (136.9 kg)  Height: 5' 1.4" (1.56 m)   Body mass index is 56.28 kg/m.   Objective:  Physical Exam Vitals reviewed.  Constitutional:      General: She is not in acute distress.    Appearance: Normal appearance.  Cardiovascular:     Rate and Rhythm: Normal rate and regular rhythm.  Pulmonary:     Effort: Pulmonary effort is normal.  Skin:    Capillary Refill: Capillary refill takes less than 2 seconds.  Neurological:     General: No focal deficit present.     Mental Status: She is alert and oriented to person, place, and time.     Cranial Nerves: No cranial nerve deficit.  Psychiatric:        Mood and Affect: Mood normal.        Behavior: Behavior normal.        Thought Content: Thought content normal.        Judgment: Judgment normal.         Assessment And Plan:     1. Body aches  Reports has had these for the last couple days,  she had been tested for Covid on 1/6  Rapid covid is negative   She is encouraged to remain in isolation until she has a negative result  - Novel Coronavirus, NAA ([facility])  2. Dyspnea on exertion  Will treat with azithromycin she has noted dyspnea on exertion after walking  Will  order remote health visit pending covid results she may need an xray of chest - azithromycin (ZITHROMAX) 250 MG tablet; Take 2 tablets (500 mg) on  Day 1,  followed by 1 tablet (250 mg) once daily on Days 2 through 5.  Dispense: 6 each; Refill: 0  3. Urine white blood cells increased  Will send urine for culture - POCT Urinalysis Dipstick (81002) - Culture, Urine  4. Increased frequency of urination  Protein present in urine, may be related to dehydration  Encouraged to stay well hydrated   5. Cough - benzonatate (TESSALON PERLES) 100 MG capsule; Take 1 capsule (100 mg total) by mouth every 6 (six) hours as needed for cough.  Dispense: 30 capsule; Refill: 1     Patient was given opportunity to ask questions. Patient verbalized understanding of the plan and was able to repeat key elements of the plan. All questions were answered to their satisfaction.  [name], FNP   I, [name], FNP, have reviewed all documentation for this visit. The documentation on 05/02/20 for the exam, diagnosis, procedures, and orders are all accurate and complete.   THE PATIENT IS ENCOURAGED TO PRACTICE SOCIAL DISTANCING DUE TO THE COVID-19 PANDEMIC.

## 2020-05-03 ENCOUNTER — Other Ambulatory Visit: Payer: Self-pay | Admitting: Nurse Practitioner

## 2020-05-03 ENCOUNTER — Encounter: Payer: Self-pay | Admitting: Nurse Practitioner

## 2020-05-03 ENCOUNTER — Other Ambulatory Visit: Payer: Self-pay | Admitting: Hematology and Oncology

## 2020-05-03 DIAGNOSIS — R0602 Shortness of breath: Secondary | ICD-10-CM | POA: Diagnosis not present

## 2020-05-03 DIAGNOSIS — R519 Headache, unspecified: Secondary | ICD-10-CM

## 2020-05-03 DIAGNOSIS — F32A Depression, unspecified: Secondary | ICD-10-CM

## 2020-05-03 DIAGNOSIS — G8929 Other chronic pain: Secondary | ICD-10-CM

## 2020-05-03 DIAGNOSIS — C50419 Malignant neoplasm of upper-outer quadrant of unspecified female breast: Secondary | ICD-10-CM

## 2020-05-03 LAB — NOVEL CORONAVIRUS, NAA: SARS-CoV-2, NAA: NOT DETECTED

## 2020-05-03 LAB — SARS-COV-2, NAA 2 DAY TAT

## 2020-05-03 NOTE — Telephone Encounter (Signed)
Cyclobenzaprine refill 

## 2020-05-05 ENCOUNTER — Ambulatory Visit: Payer: Medicare Other | Admitting: Physical Therapy

## 2020-05-05 ENCOUNTER — Other Ambulatory Visit: Payer: Self-pay | Admitting: Nurse Practitioner

## 2020-05-05 DIAGNOSIS — N390 Urinary tract infection, site not specified: Secondary | ICD-10-CM

## 2020-05-05 LAB — URINE CULTURE

## 2020-05-05 MED ORDER — NITROFURANTOIN MONOHYD MACRO 100 MG PO CAPS: 100.0000 mg | ORAL_CAPSULE | Freq: Two times a day (BID) | ORAL | 0 refills | Status: AC

## 2020-05-05 NOTE — Telephone Encounter (Signed)
Cyclobenzaprine refill 

## 2020-05-28 ENCOUNTER — Other Ambulatory Visit: Payer: Self-pay | Admitting: Orthopaedic Surgery

## 2020-05-29 ENCOUNTER — Other Ambulatory Visit: Payer: Self-pay | Admitting: Nurse Practitioner

## 2020-05-30 ENCOUNTER — Encounter: Payer: Self-pay | Admitting: Nurse Practitioner

## 2020-05-30 NOTE — Telephone Encounter (Signed)
Called and spoke with patient. She understands.

## 2020-05-30 NOTE — Telephone Encounter (Signed)
Discussed with her at last appt that I was not willing to prescribe chronic pain meds-needs to checkwith her fibromyalgia physician or consider pain clinic-thanks

## 2020-05-30 NOTE — Telephone Encounter (Signed)
thanks

## 2020-05-31 ENCOUNTER — Other Ambulatory Visit: Payer: Self-pay | Admitting: Nurse Practitioner

## 2020-05-31 DIAGNOSIS — M797 Fibromyalgia: Secondary | ICD-10-CM

## 2020-06-01 ENCOUNTER — Ambulatory Visit
Admission: RE | Admit: 2020-06-01 | Discharge: 2020-06-01 | Disposition: A | Discharge status: Home / Self Care | Payer: Medicare Other | Source: Ambulatory Visit | Attending: Nurse Practitioner | Admitting: Nurse Practitioner

## 2020-06-01 ENCOUNTER — Other Ambulatory Visit: Payer: Self-pay

## 2020-06-01 ENCOUNTER — Ambulatory Visit: Payer: Medicare Other

## 2020-06-01 ENCOUNTER — Other Ambulatory Visit: Payer: Self-pay | Admitting: Nurse Practitioner

## 2020-06-01 DIAGNOSIS — R234 Changes in skin texture: Secondary | ICD-10-CM

## 2020-06-01 DIAGNOSIS — R922 Inconclusive mammogram: Secondary | ICD-10-CM | POA: Diagnosis not present

## 2020-06-01 NOTE — Telephone Encounter (Signed)
Meloxicam refill 

## 2020-06-02 ENCOUNTER — Telehealth: Payer: Self-pay

## 2020-06-02 NOTE — Telephone Encounter (Signed)
I left the pt a message that Laurance Flatten, [place] said the pt should not need a refill of meloxicam 90 day supply sent in December

## 2020-06-21 ENCOUNTER — Ambulatory Visit (INDEPENDENT_AMBULATORY_CARE_PROVIDER_SITE_OTHER): Payer: Medicare Other | Admitting: Orthopaedic Surgery

## 2020-06-21 ENCOUNTER — Encounter: Payer: Self-pay | Admitting: Orthopaedic Surgery

## 2020-06-21 ENCOUNTER — Other Ambulatory Visit: Payer: Self-pay

## 2020-06-21 VITALS — Ht 61.0 in | Wt 301.0 lb

## 2020-06-21 DIAGNOSIS — M170 Bilateral primary osteoarthritis of knee: Secondary | ICD-10-CM | POA: Diagnosis not present

## 2020-06-21 MED ORDER — BUPIVACAINE HCL 0.25 % IJ SOLN
2.0000 mL | INTRAMUSCULAR | Status: AC | PRN
  Administered 2020-06-21: 2 mL via INTRA_ARTICULAR

## 2020-06-21 MED ORDER — LIDOCAINE HCL 1 % IJ SOLN
2.0000 mL | INTRAMUSCULAR | Status: AC | PRN
  Administered 2020-06-21: 2 mL

## 2020-06-21 NOTE — Progress Notes (Signed)
Office Visit Note   Patient: Ashley Lindsey           Date of Birth: [birth year]           MRN: [id number] Visit Date: 06/21/2020              Requested by: [name], [place] [address] [place],  [place] [address] PCP: [name], FNP   Assessment & Plan: Visit Diagnoses:  1. Bilateral primary osteoarthritis of knee     Plan: Ashley Lindsey has been experiencing recurrent symptoms of osteoarthritis of her right knee.  She has had prior films demonstrating significant degenerative changes.  I will inject the knee with betamethasone  Follow-Up Instructions: Return if symptoms worsen or fail to improve.   Orders:  No orders of the defined types were placed in this encounter.  No orders of the defined types were placed in this encounter.     Procedures: Large Joint Inj: R knee on 06/21/2020 3:30 PM Indications: pain and diagnostic evaluation Details: 25 G 1.5 in needle, anteromedial approach  Arthrogram: No  Medications: 2 mL lidocaine 1 %; 2 mL bupivacaine 0.25 %  12 mg betamethasone injected in the right knee with Xylocaine and Marcaine Procedure, treatment alternatives, risks and benefits explained, specific risks discussed. Consent was given by the patient. Immediately prior to procedure a time out was called to verify the correct patient, procedure, equipment, support staff and site/side marked as required. Patient was prepped and draped in the usual sterile fashion.       Clinical Data: No additional findings.   Subjective: Chief Complaint  Patient presents with  . Right Knee - Pain  Patient presents today for right knee pain. She had her right injected last year. She said that she gets great results for a month following the injection. She slipped getting out of the pool last week and caught herself. She said that it irritated her knee and caused it to swell. She is wanting to get another cortisone injection today.  Has had prior films of her  knee demonstrating near bone-on-bone in the medial compartment  HPI  Review of Systems   Objective: Vital Signs: Ht 5\' 1"  (1.549 m)   Wt (!) 301 lb (136.5 kg)   BMI 56.87 kg/m   Physical Exam Constitutional:      Appearance: She is well-developed and well-nourished.  HENT:     Mouth/Throat:     Mouth: Oropharynx is clear and moist.  Eyes:     Extraocular Movements: EOM normal.     Pupils: Pupils are equal, round, and reactive to light.  Pulmonary:     Effort: Pulmonary effort is normal.  Skin:    General: Skin is warm and dry.  Neurological:     Mental Status: She is alert and oriented to person, place, and time.  Psychiatric:        Mood and Affect: Mood and affect normal.        Behavior: Behavior normal.     Ortho Exam right knee may have a very minimal effusion.  It was not hot warm or red.  Mostly medial joint pain and slight varus with weightbearing.  Thought she lacked just a few degrees to full extension and flexed about 100 degrees without instability Specialty Comments:  No specialty comments available.  Imaging: No results found.   PMFS History: Patient Active Problem List   Diagnosis Date Noted  . Bilateral primary osteoarthritis of knee 02/09/2020  .  Chronic intermittent hypoxia with obstructive sleep apnea 08/24/2019  . Chronic intractable headache 07/14/2019  . Morning headache 07/14/2019  . Loud snoring 07/14/2019  . Super obesity 07/14/2019  . Sleeps in sitting position due to orthopnea 07/14/2019  . Chest pain due to GERD 07/14/2019  . Nocturia more than twice per night 07/14/2019  . Unilateral primary osteoarthritis, right knee 05/26/2019  . Chronic migraine without aura without status migrainosus, not intractable 05/13/2019  . Class 3 severe obesity due to excess calories without serious comorbidity with body mass index (BMI) of 40.0 to 44.9 in adult ([place]) 08/05/2018  . Depression 08/05/2018  . Chronic nonintractable headache 08/05/2018   . Elevated blood-pressure reading without diagnosis of hypertension 06/06/2018  . Chronic pain of right ankle 04/24/2018  . Breast cancer of lower-outer quadrant of right female breast ([facility]) 12/02/2014  . Bronchospasm 09/21/2014  . Dyspnea 09/21/2014  . Chronic pain of right knee 09/21/2014  . Fibromyalgia 03/06/2013  . Dense breasts 03/06/2013  . Mastalgia 03/06/2013  . Back pain, lumbosacral 03/06/2013  . Lymphedema of arm 03/06/2013  . Atypical chest pain 01/06/2013  . Breast cancer of upper-inner quadrant of left female breast ([facility]) 09/08/2012  . Family history of malignant neoplasm of breast 09/08/2012  . S/P radiation therapy   . History of breast cancer in female 08/13/2011   Past Medical History:  Diagnosis Date  . Abdominal cramps   . Arthritis   . Cancer [facility])    s/p radiation- last dose 6/12//has been off Tamoxifen 8/12  . Chronic headaches   . Costochondritis   . Fatigue   . Fibromyalgia 03/06/2013  . Lymphedema    RIGHT ARM-////  STATES USE LEFT ARM FOR BP'S  . Migraines   . Muscle spasms of head and/or neck    following to the breast  . Passed out    4th of july  . Personal history of radiation therapy 2010   lt breast   . S/P radiation therapy 08/19/07 - 10/10/07   Left Breast/5040 cGy/28 fractions with Boost for a toatl dose of 6300 dGy  . S/P radiation therapy 08/14/10 -10/03/10   right breast  . Sleep apnea    STOP BANG SCORE 5    Family History  Problem Relation Age of Onset  . Breast cancer Mother 60  . Heart disease Father   . Prostate cancer Maternal Uncle 78  . Cancer Maternal Aunt 63       d. from "female cancer" possibly cervical  . Breast cancer Maternal Aunt 60  . Prostate cancer Paternal Uncle   . Prostate cancer Maternal Grandfather 74  . Prostate cancer Maternal Uncle 80  . Prostate cancer Paternal Uncle   . Breast cancer Cousin 51       mat 1st cousin    Past Surgical History:  Procedure Laterality Date  . ABDOMINAL HYSTERECTOMY      with left salpingooophorectomy  . BREAST LUMPECTOMY  2009/2012   LEFT/RIGHT with lymph node dissection  . KNEE ARTHROSCOPY     right  . OOPHORECTOMY  2013   Social History   Occupational History  . Occupation: disability  Tobacco Use  . Smoking status: Former Smoker    Packs/day: 0.25    Years: 1.00    Pack years: 0.25    Types: Cigarettes  . Smokeless tobacco: Never Used  Vaping Use  . Vaping Use: Never used  Substance and Sexual Activity  . Alcohol use: Not Currently  . Drug use:  No  . Sexual activity: Not Currently    Birth control/protection: Surgical

## 2020-07-07 ENCOUNTER — Other Ambulatory Visit: Payer: Self-pay

## 2020-07-07 DIAGNOSIS — C50419 Malignant neoplasm of upper-outer quadrant of unspecified female breast: Secondary | ICD-10-CM

## 2020-07-07 DIAGNOSIS — M797 Fibromyalgia: Secondary | ICD-10-CM

## 2020-07-07 MED ORDER — MELOXICAM 7.5 MG PO TABS: 7.5000 mg | ORAL_TABLET | Freq: Every day | ORAL | 0 refills | Status: DC

## 2020-07-07 MED ORDER — CYCLOBENZAPRINE HCL 10 MG PO TABS: ORAL_TABLET | ORAL | 0 refills | Status: DC

## 2020-07-21 DIAGNOSIS — C50912 Malignant neoplasm of unspecified site of left female breast: Secondary | ICD-10-CM | POA: Diagnosis not present

## 2020-07-25 ENCOUNTER — Other Ambulatory Visit: Payer: Self-pay | Admitting: Nurse Practitioner

## 2020-07-25 DIAGNOSIS — R030 Elevated blood-pressure reading, without diagnosis of hypertension: Secondary | ICD-10-CM

## 2020-08-02 ENCOUNTER — Other Ambulatory Visit: Payer: Self-pay | Admitting: Nurse Practitioner

## 2020-08-02 ENCOUNTER — Other Ambulatory Visit: Payer: Self-pay | Admitting: Hematology and Oncology

## 2020-08-02 DIAGNOSIS — R600 Localized edema: Secondary | ICD-10-CM

## 2020-08-03 ENCOUNTER — Telehealth: Payer: Self-pay | Admitting: Hematology and Oncology

## 2020-08-03 ENCOUNTER — Ambulatory Visit (INDEPENDENT_AMBULATORY_CARE_PROVIDER_SITE_OTHER): Payer: Medicare Other | Admitting: Nurse Practitioner

## 2020-08-03 ENCOUNTER — Other Ambulatory Visit: Payer: Self-pay

## 2020-08-03 ENCOUNTER — Encounter: Payer: Self-pay | Admitting: Nurse Practitioner

## 2020-08-03 VITALS — BP 152/84 | HR 93 | Temp 97.5°F | Ht 61.0 in | Wt 299.0 lb

## 2020-08-03 DIAGNOSIS — E7800 Pure hypercholesterolemia, unspecified: Secondary | ICD-10-CM

## 2020-08-03 DIAGNOSIS — Z6841 Body Mass Index (BMI) 40.0 and over, adult: Secondary | ICD-10-CM

## 2020-08-03 DIAGNOSIS — L603 Nail dystrophy: Secondary | ICD-10-CM

## 2020-08-03 DIAGNOSIS — I10 Essential (primary) hypertension: Secondary | ICD-10-CM

## 2020-08-03 DIAGNOSIS — R030 Elevated blood-pressure reading, without diagnosis of hypertension: Secondary | ICD-10-CM

## 2020-08-03 MED ORDER — DOXYCYCLINE HYCLATE 100 MG PO CAPS: 100.0000 mg | ORAL_CAPSULE | Freq: Two times a day (BID) | ORAL | 0 refills | Status: DC

## 2020-08-03 MED ORDER — MAGNESIUM 300 MG PO CAPS
1.0000 | ORAL_CAPSULE | Freq: Every day | ORAL | 1 refills | Status: DC
Start: 2020-08-03 — End: 2020-11-11

## 2020-08-03 MED ORDER — HYDROCHLOROTHIAZIDE 25 MG PO TABS: 12.5000 mg | ORAL_TABLET | Freq: Every day | ORAL | 1 refills | Status: DC

## 2020-08-03 NOTE — Progress Notes (Addendum)
I,[name] [employer] as a Education administrator for [employer], FNP.,have documented all relevant documentation on the behalf of [name], FNP,as directed by  [name], FNP while in the presence of [name], [place]. This visit occurred during the SARS-CoV-2 public health emergency.  Safety protocols were in place, including screening questions prior to the visit, additional usage of staff PPE, and extensive cleaning of exam room while observing appropriate contact time as indicated for disinfecting solutions.  Subjective:     Patient ID: Ashley Lindsey , female    DOB: 07-06-1960 , 60 y.o.   MRN: [id number]   Chief Complaint  Patient presents with  . Hypertension    HPI  Patient presents today for a blood pressure f/u. She is under more stress with caring for her uncle, he will be transitioning to [facility]. She does admit she is eating at different times at night. She went swimming for the first time in the last several years since covid. She feels like putting on a bathing suit on and off takes a lot out of her.   Wt Readings from Last 3 Encounters: 08/03/20 : 299 lb (135.6 kg) 06/21/20 : (!) 301 lb (136.5 kg) 05/02/20 : (!) 301 lb 12.8 oz (136.9 kg)   Hypertension This is a chronic problem. The current episode started more than 1 year ago. The problem has been gradually improving since onset. The problem is controlled. Pertinent negatives include no anxiety, chest pain, headaches or palpitations. Risk factors for coronary artery disease include sedentary lifestyle and obesity. There are no compliance problems.  There is no history of chronic renal disease.     Past Medical History:  Diagnosis Date  . Abdominal cramps   . Arthritis   . Cancer [facility])    s/p radiation- last dose 6/12//has been off Tamoxifen 8/12  . Chronic headaches   . Costochondritis   . Fatigue   . Fibromyalgia 03/06/2013  . Lymphedema    RIGHT ARM-////  STATES USE LEFT ARM FOR BP'S  . Migraines   . Muscle  spasms of head and/or neck    following to the breast  . Passed out    4th of july  . Personal history of radiation therapy 2010   lt breast   . S/P radiation therapy 08/19/07 - 10/10/07   Left Breast/5040 cGy/28 fractions with Boost for a toatl dose of 6300 dGy  . S/P radiation therapy 08/14/10 -10/03/10   right breast  . Sleep apnea    STOP BANG SCORE 5     Family History  Problem Relation Age of Onset  . Breast cancer Mother 93  . Heart disease Father   . Prostate cancer Maternal Uncle 78  . Cancer Maternal Aunt 63       d. from "female cancer" possibly cervical  . Breast cancer Maternal Aunt 60  . Prostate cancer Paternal Uncle   . Prostate cancer Maternal Grandfather 65  . Prostate cancer Maternal Uncle 80  . Prostate cancer Paternal Uncle   . Breast cancer Cousin 64       mat 1st cousin     Current Outpatient Medications:  .  acetaminophen (TYLENOL) 650 MG CR tablet, Take 650 mg by mouth every 8 (eight) hours as needed for pain., Disp: , Rfl:  .  albuterol (VENTOLIN HFA) 108 (90 Base) MCG/ACT inhaler, INHALE 1-2 PUFFS INTO THE LUNGS EVERY 6 (SIX) HOURS AS NEEDED FOR WHEEZING OR SHORTNESS OF BREATH., Disp: 6.7 each, Rfl: 1 .  benzonatate (TESSALON PERLES) 100 MG capsule, Take 1 capsule (100 mg total) by mouth every 6 (six) hours as needed for cough., Disp: 30 capsule, Rfl: 1 .  Bisacodyl (DULCOLAX PO), Take 1 capsule by mouth daily as needed (constipation). , Disp: , Rfl:  .  cholecalciferol 5000 units TABS, Take 1 tablet (5,000 Units total) by mouth 2 (two) times daily. (Patient taking differently: Take 5,000 Units by mouth daily.), Disp: , Rfl:  .  cyclobenzaprine (FLEXERIL) 10 MG tablet, Take one tablet by mouth daily as needed, Disp: 90 tablet, Rfl: 0 .  DEXILANT 60 MG capsule, TAKE 1 CAPSULE BY MOUTH EVERY DAY, Disp: 90 capsule, Rfl: 1 .  diphenhydrAMINE (BENADRYL) 25 mg capsule, Take 25 mg by mouth 2 (two) times daily., Disp: , Rfl:  .  doxycycline (VIBRAMYCIN) 100 MG  capsule, Take 1 capsule (100 mg total) by mouth 2 (two) times daily., Disp: 14 capsule, Rfl: 0 .  Erenumab-aooe (AIMOVIG) 140 MG/ML SOAJ, Inject 140 mg into the skin every 30 (thirty) days., Disp: 1 pen, Rfl: 11 .  fish oil-omega-3 fatty acids 1000 MG capsule, Take 1 g by mouth daily., Disp: , Rfl:  .  Flaxseed, Linseed, (FLAXSEED OIL MAX STR PO), Take 1 capsule by mouth daily. , Disp: , Rfl:  .  gabapentin (NEURONTIN) 300 MG capsule, Take 1 capsule (300 mg total) by mouth 3 (three) times daily. Medication may be dispensed to patient today. If you have questions, please call Dr. Ranell Patrick at [phone]., Disp: 90 capsule, Rfl: 3 .  Iron-FA-B Cmp-C-Biot-Probiotic (FUSION PLUS) CAPS, Take 1 capsule by mouth daily., Disp: 30 capsule, Rfl: 3 .  Magnesium 300 MG CAPS, Take 1 capsule (300 mg total) by mouth daily. Take with evening meal, Disp: 90 capsule, Rfl: 1 .  Multiple Vitamins-Minerals (MULTIVITAMIN & MINERAL PO), Take 1 tablet by mouth daily., Disp: , Rfl:  .  omeprazole (PRILOSEC) 20 MG capsule, Take 20 mg by mouth daily., Disp: , Rfl:  .  oxyCODONE-acetaminophen (PERCOCET/ROXICET) 5-325 MG tablet, Take 1 tablet by mouth every 4 (four) hours as needed for severe pain., Disp: 15 tablet, Rfl: 0 .  promethazine (PHENERGAN) 25 MG tablet, Take 1 tablet (25 mg total) by mouth every 6 (six) hours as needed for nausea or vomiting., Disp: 30 tablet, Rfl: 0 .  propranolol (INDERAL) 20 MG tablet, TAKE 1 TABLET BY MOUTH TWICE A DAY, Disp: 180 tablet, Rfl: 0 .  rizatriptan (MAXALT-MLT) 10 MG disintegrating tablet, Take 1 tablet (10 mg total) by mouth as needed for migraine. May repeat in 2 hours if needed, Disp: 9 tablet, Rfl: 11 .  senna (SENOKOT) 8.6 MG tablet, Take 1 tablet by mouth daily as needed for constipation. , Disp: , Rfl:  .  SYMBICORT 80-4.5 MCG/ACT inhaler, INHALE 2 PUFFS INTO THE LUNGS IN THE MORNING AND AT BEDTIME., Disp: 30.6 each, Rfl: 1 .  traMADol (ULTRAM) 50 MG tablet, Take 1 tablet (50 mg  total) by mouth 2 (two) times daily as needed., Disp: 30 tablet, Rfl: 0 .  triamcinolone cream (KENALOG) 0.1 %, Apply 1 application topically 2 (two) times daily. Apply for up to one week. (Patient taking differently: Apply 1 application topically 2 (two) times daily as needed (rash).), Disp: 30 g, Rfl: 0 .  UNABLE TO FIND, Rx: L8000-Post Surgical Bra (Quantity: 6) V6945- Silicone Breast Prosthesis (Quantity: 2) Dx: 174.9; Bilateral partial mastectomy Replacement due to hot flashes since previous items were too heavy and hot, Disp: 1 each, Rfl: 0 .  venlafaxine XR (EFFEXOR-XR) 150 MG 24 hr capsule, TAKE 1 CAPSULE BY MOUTH NIGHTLY WITH 75 MG CAPSULE, TOTAL OF 225 MG NIGHTLY, Disp: 90 capsule, Rfl: 0 .  vitamin E 200 UNIT capsule, Take 200 Units by mouth daily., Disp: , Rfl:  .  hydrochlorothiazide (HYDRODIURIL) 12.5 MG tablet, TAKE 1 TABLET BY MOUTH EVERY DAY, Disp: 90 tablet, Rfl: 0 .  hydrochlorothiazide (HYDRODIURIL) 25 MG tablet, TAKE 1 TABLET BY MOUTH EVERY DAY, Disp: 90 tablet, Rfl: 1 .  meloxicam (MOBIC) 7.5 MG tablet, TAKE 1 TABLET BY MOUTH EVERY DAY, Disp: 90 tablet, Rfl: 0 .  ondansetron (ZOFRAN) 4 MG tablet, TAKE 1 TABLET BY MOUTH DAILY AS NEEDED FOR NAUSEA OR VOMITING, Disp: 30 tablet, Rfl: 1 .  venlafaxine XR (EFFEXOR-XR) 75 MG 24 hr capsule, TAKE 1 CAPSULE BY MOUTH DAILY IN COMBINATION WITH XR 150 MG (TO EQUAL 225 MG TOTAL DAILY)., Disp: 90 capsule, Rfl: 0   Allergies  Allergen Reactions  . Aspirin     unknown  . Penicillins Hives and Swelling    Has patient had a PCN reaction causing immediate rash, facial/tongue/throat swelling, SOB or lightheadedness with hypotension: Yes Has patient had a PCN reaction causing severe rash involving mucus membranes or skin necrosis: Yes Has patient had a PCN reaction that required hospitalization Yes Has patient had a PCN reaction occurring within the last 10 years: No If all of the above answers are "NO", then may proceed with Cephalosporin use.       Review of Systems  Constitutional: Negative.   Respiratory: Negative.   Cardiovascular: Negative for chest pain, palpitations and leg swelling.  Skin:       She is having "pus pockets on her fingernails". She has stopped to wearing acrylic.   Neurological: Negative for dizziness and headaches.  Psychiatric/Behavioral: Negative.      Today's Vitals   08/03/20 1110  BP: (!) 152/84  Pulse: 93  Temp: (!) 97.5 F (36.4 C)  TempSrc: Oral  Weight: 299 lb (135.6 kg)  Height: 5' 1"  (1.549 m)  PainSc: 3   PainLoc: Foot   Body mass index is 56.5 kg/m.   Objective:  Physical Exam Vitals reviewed.  Constitutional:      General: She is not in acute distress.    Appearance: Normal appearance. She is obese.  Cardiovascular:     Rate and Rhythm: Normal rate and regular rhythm.     Pulses: Normal pulses.     Heart sounds: Normal heart sounds. No murmur heard.   Pulmonary:     Effort: Pulmonary effort is normal. No respiratory distress.     Breath sounds: Normal breath sounds. No wheezing.  Skin:    General: Skin is warm.     Comments: Nailbed of thumbs with discharge present. Also has tenderness noted and hyperpigmented skin to cuticle  Neurological:     General: No focal deficit present.     Mental Status: She is alert and oriented to person, place, and time.     Cranial Nerves: No cranial nerve deficit.     Motor: No weakness.  Psychiatric:        Mood and Affect: Mood normal.        Behavior: Behavior normal.        Thought Content: Thought content normal.        Judgment: Judgment normal.         Assessment And Plan:     1. Essential hypertension  Blood pressure is elevated, continue  with HCTZ and she is encouraged to take magnesium with evening meal - Magnesium 300 MG CAPS; Take 1 capsule (300 mg total) by mouth daily. Take with evening meal  Dispense: 90 capsule; Refill: 1 - BMP8+eGFR  2. Elevated cholesterol  Chronic, stable  No current medications,  diet controlled - Lipid panel  3. Nail dystrophy  Her nail beds near the cuticle area of her thumbs has drainage present with tenderness.  I have advised her to avoid getting artificial nails and allow the cuticles to heal - doxycycline (VIBRAMYCIN) 100 MG capsule; Take 1 capsule (100 mg total) by mouth 2 (two) times daily.  Dispense: 14 capsule; Refill: 0  4. Class 3 severe obesity due to excess calories without serious comorbidity with body mass index (BMI) of 50.0 to 59.9 in adult [facility])  Chronic  Discussed healthy diet and regular exercise options   Encouraged to exercise at least 150 minutes per week with 2 days of strength training   Patient was given opportunity to ask questions. Patient verbalized understanding of the plan and was able to repeat key elements of the plan. All questions were answered to their satisfaction.  [name], FNP   I, [name], FNP, have reviewed all documentation for this visit. The documentation on 08/03/20 for the exam, diagnosis, procedures, and orders are all accurate and complete.   IF YOU HAVE BEEN REFERRED TO A SPECIALIST, IT MAY TAKE 1-2 WEEKS TO SCHEDULE/PROCESS THE REFERRAL. IF YOU HAVE NOT HEARD FROM US/SPECIALIST IN TWO WEEKS, PLEASE GIVE [place] A CALL AT [phone].   THE PATIENT IS ENCOURAGED TO PRACTICE SOCIAL DISTANCING DUE TO THE COVID-19 PANDEMIC.

## 2020-08-03 NOTE — Telephone Encounter (Signed)
Scheduled appt per 4/12 sch msg. Pt aware.  

## 2020-08-03 NOTE — Patient Instructions (Signed)

## 2020-08-04 LAB — BMP8+EGFR
BUN/Creatinine Ratio: 21 (ref 12–28)
BUN: 14 mg/dL (ref 8–27)
CO2: 26 mmol/L (ref 20–29)
Calcium: 9.1 mg/dL (ref 8.7–10.3)
Chloride: 100 mmol/L (ref 96–106)
Creatinine, Ser: 0.66 mg/dL (ref 0.57–1.00)
Glucose: 105 mg/dL — ABNORMAL HIGH (ref 65–99)
Potassium: 4.6 mmol/L (ref 3.5–5.2)
Sodium: 140 mmol/L (ref 134–144)
eGFR: 100 mL/min/{1.73_m2} (ref >59)

## 2020-08-04 LAB — LIPID PANEL
Chol/HDL Ratio: 3.3 ratio (ref 0.0–4.4)
Cholesterol, Total: 203 mg/dL — ABNORMAL HIGH (ref 100–199)
HDL: 62 mg/dL (ref >39)
LDL Chol Calc (NIH): 98 mg/dL (ref 0–99)
Triglycerides: 259 mg/dL — ABNORMAL HIGH (ref 0–149)
VLDL Cholesterol Cal: 43 mg/dL — ABNORMAL HIGH (ref 5–40)

## 2020-08-07 ENCOUNTER — Encounter: Payer: Self-pay | Admitting: Nurse Practitioner

## 2020-08-11 ENCOUNTER — Other Ambulatory Visit: Payer: Self-pay | Admitting: Nurse Practitioner

## 2020-08-11 DIAGNOSIS — R030 Elevated blood-pressure reading, without diagnosis of hypertension: Secondary | ICD-10-CM

## 2020-08-14 ENCOUNTER — Other Ambulatory Visit: Payer: Self-pay | Admitting: Hematology and Oncology

## 2020-08-18 ENCOUNTER — Other Ambulatory Visit: Payer: Self-pay | Admitting: Nurse Practitioner

## 2020-08-18 DIAGNOSIS — R030 Elevated blood-pressure reading, without diagnosis of hypertension: Secondary | ICD-10-CM

## 2020-08-18 DIAGNOSIS — M797 Fibromyalgia: Secondary | ICD-10-CM

## 2020-08-23 ENCOUNTER — Other Ambulatory Visit: Payer: Self-pay | Admitting: Nurse Practitioner

## 2020-08-23 DIAGNOSIS — C50419 Malignant neoplasm of upper-outer quadrant of unspecified female breast: Secondary | ICD-10-CM

## 2020-08-26 ENCOUNTER — Other Ambulatory Visit: Payer: Self-pay

## 2020-08-26 MED ORDER — FLUCONAZOLE 150 MG PO TABS: ORAL_TABLET | ORAL | 0 refills | Status: DC

## 2020-08-30 ENCOUNTER — Other Ambulatory Visit: Payer: Self-pay | Admitting: Nurse Practitioner

## 2020-08-30 DIAGNOSIS — R110 Nausea: Secondary | ICD-10-CM

## 2020-09-07 ENCOUNTER — Telehealth: Payer: Self-pay | Admitting: Orthopaedic Surgery

## 2020-09-07 NOTE — Telephone Encounter (Signed)
Please see Dr.Whitfield's response.

## 2020-09-07 NOTE — Telephone Encounter (Signed)
Of course-see if Dr Lorin Mercy has any time-thanks

## 2020-09-07 NOTE — Telephone Encounter (Signed)
Pt wants to get an injection with Dr.Whitfield but he will be out of town for 2 weeks and shes asking if she can see someone else?

## 2020-09-11 ENCOUNTER — Other Ambulatory Visit: Payer: Self-pay

## 2020-09-11 ENCOUNTER — Emergency Department (HOSPITAL_COMMUNITY): Payer: Medicare Other

## 2020-09-11 ENCOUNTER — Emergency Department (HOSPITAL_COMMUNITY)
Admission: EM | Admit: 2020-09-11 | Discharge: 2020-09-11 | Disposition: A | Discharge status: Home / Self Care | Payer: Medicare Other | Attending: Emergency Medicine | Admitting: Emergency Medicine

## 2020-09-11 DIAGNOSIS — G43109 Migraine with aura, not intractable, without status migrainosus: Secondary | ICD-10-CM

## 2020-09-11 DIAGNOSIS — R42 Dizziness and giddiness: Secondary | ICD-10-CM | POA: Diagnosis not present

## 2020-09-11 DIAGNOSIS — R531 Weakness: Secondary | ICD-10-CM | POA: Diagnosis not present

## 2020-09-11 DIAGNOSIS — R2990 Unspecified symptoms and signs involving the nervous system: Secondary | ICD-10-CM

## 2020-09-11 DIAGNOSIS — Z0389 Encounter for observation for other suspected diseases and conditions ruled out: Secondary | ICD-10-CM | POA: Diagnosis not present

## 2020-09-11 DIAGNOSIS — G43809 Other migraine, not intractable, without status migrainosus: Secondary | ICD-10-CM | POA: Diagnosis not present

## 2020-09-11 DIAGNOSIS — Z87891 Personal history of nicotine dependence: Secondary | ICD-10-CM | POA: Diagnosis not present

## 2020-09-11 DIAGNOSIS — Z853 Personal history of malignant neoplasm of breast: Secondary | ICD-10-CM | POA: Insufficient documentation

## 2020-09-11 DIAGNOSIS — Z79899 Other long term (current) drug therapy: Secondary | ICD-10-CM | POA: Diagnosis not present

## 2020-09-11 DIAGNOSIS — G4489 Other headache syndrome: Secondary | ICD-10-CM | POA: Diagnosis not present

## 2020-09-11 DIAGNOSIS — R29818 Other symptoms and signs involving the nervous system: Secondary | ICD-10-CM | POA: Diagnosis not present

## 2020-09-11 DIAGNOSIS — R519 Headache, unspecified: Secondary | ICD-10-CM | POA: Diagnosis present

## 2020-09-11 DIAGNOSIS — Z923 Personal history of irradiation: Secondary | ICD-10-CM | POA: Diagnosis not present

## 2020-09-11 DIAGNOSIS — I639 Cerebral infarction, unspecified: Secondary | ICD-10-CM | POA: Diagnosis not present

## 2020-09-11 DIAGNOSIS — R29810 Facial weakness: Secondary | ICD-10-CM | POA: Diagnosis not present

## 2020-09-11 DIAGNOSIS — I10 Essential (primary) hypertension: Secondary | ICD-10-CM | POA: Diagnosis not present

## 2020-09-11 LAB — TROPONIN I (HIGH SENSITIVITY): Troponin I (High Sensitivity): 6 ng/L (ref <18)

## 2020-09-11 LAB — COMPREHENSIVE METABOLIC PANEL
ALT: 18 U/L (ref 0–44)
AST: 25 U/L (ref 15–41)
Albumin: 4 g/dL (ref 3.5–5.0)
Alkaline Phosphatase: 60 U/L (ref 38–126)
Anion gap: 15 (ref 5–15)
BUN: 7 mg/dL — ABNORMAL LOW (ref 8–23)
CO2: 22 mmol/L (ref 22–32)
Calcium: 9.4 mg/dL (ref 8.9–10.3)
Chloride: 99 mmol/L (ref 98–111)
Creatinine, Ser: 0.7 mg/dL (ref 0.44–1.00)
GFR, Estimated: >60 mL/min (ref >60)
Glucose, Bld: 102 mg/dL — ABNORMAL HIGH (ref 70–99)
Potassium: 3.9 mmol/L (ref 3.5–5.1)
Sodium: 136 mmol/L (ref 135–145)
Total Bilirubin: 0.5 mg/dL (ref 0.3–1.2)
Total Protein: 8.1 g/dL (ref 6.5–8.1)

## 2020-09-11 LAB — DIFFERENTIAL
Abs Immature Granulocytes: 0.03 10*3/uL (ref 0.00–0.07)
Basophils Absolute: 0 10*3/uL (ref 0.0–0.1)
Basophils Relative: 0 %
Eosinophils Absolute: 0.1 10*3/uL (ref 0.0–0.5)
Eosinophils Relative: 1 %
Immature Granulocytes: 0 %
Lymphocytes Relative: 29 %
Lymphs Abs: 3 10*3/uL (ref 0.7–4.0)
Monocytes Absolute: 0.5 10*3/uL (ref 0.1–1.0)
Monocytes Relative: 5 %
Neutro Abs: 6.6 10*3/uL (ref 1.7–7.7)
Neutrophils Relative %: 65 %

## 2020-09-11 LAB — CBC
HCT: 45.6 % (ref 36.0–46.0)
Hemoglobin: 13.3 g/dL (ref 12.0–15.0)
MCH: 26 pg (ref 26.0–34.0)
MCHC: 29.2 g/dL — ABNORMAL LOW (ref 30.0–36.0)
MCV: 89.2 fL (ref 80.0–100.0)
Platelets: 368 10*3/uL (ref 150–400)
RBC: 5.11 MIL/uL (ref 3.87–5.11)
RDW: 14.6 % (ref 11.5–15.5)
WBC: 10.2 10*3/uL (ref 4.0–10.5)
nRBC: 0 % (ref 0.0–0.2)

## 2020-09-11 LAB — PROTIME-INR
INR: 1.1 (ref 0.8–1.2)
Prothrombin Time: 13.7 seconds (ref 11.4–15.2)

## 2020-09-11 LAB — I-STAT CHEM 8, ED
BUN: 6 mg/dL — ABNORMAL LOW (ref 8–23)
Calcium, Ion: 1.14 mmol/L — ABNORMAL LOW (ref 1.15–1.40)
Chloride: 100 mmol/L (ref 98–111)
Creatinine, Ser: 0.6 mg/dL (ref 0.44–1.00)
Glucose, Bld: 107 mg/dL — ABNORMAL HIGH (ref 70–99)
HCT: 46 % (ref 36.0–46.0)
Hemoglobin: 15.6 g/dL — ABNORMAL HIGH (ref 12.0–15.0)
Potassium: 3.7 mmol/L (ref 3.5–5.1)
Sodium: 139 mmol/L (ref 135–145)
TCO2: 28 mmol/L (ref 22–32)

## 2020-09-11 LAB — CBG MONITORING, ED: Glucose-Capillary: 109 mg/dL — ABNORMAL HIGH (ref 70–99)

## 2020-09-11 LAB — APTT: aPTT: 34 seconds (ref 24–36)

## 2020-09-11 MED ORDER — DIPHENHYDRAMINE HCL 25 MG PO TABS: 25.0000 mg | ORAL_TABLET | Freq: Four times a day (QID) | ORAL | 0 refills | Status: AC | PRN

## 2020-09-11 MED ORDER — FENTANYL CITRATE (PF) 100 MCG/2ML IJ SOLN: 50.0000 ug | Freq: Once | INTRAMUSCULAR | Status: AC

## 2020-09-11 MED ORDER — FENTANYL CITRATE (PF) 100 MCG/2ML IJ SOLN
INTRAMUSCULAR | Status: AC
  Administered 2020-09-11: 50 ug via INTRAVENOUS
  Filled 2020-09-11: qty 2

## 2020-09-11 MED ORDER — ONDANSETRON HCL 4 MG/2ML IJ SOLN: 4.0000 mg | Freq: Once | INTRAMUSCULAR | Status: AC

## 2020-09-11 MED ORDER — METOCLOPRAMIDE HCL 5 MG/ML IJ SOLN
10.0000 mg | Freq: Once | INTRAMUSCULAR | Status: AC
  Administered 2020-09-11: 10 mg via INTRAVENOUS
  Filled 2020-09-11: qty 2

## 2020-09-11 MED ORDER — DIPHENHYDRAMINE HCL 50 MG/ML IJ SOLN
12.5000 mg | Freq: Once | INTRAMUSCULAR | Status: AC
  Administered 2020-09-11: 12.5 mg via INTRAVENOUS
  Filled 2020-09-11: qty 1

## 2020-09-11 MED ORDER — SODIUM CHLORIDE 0.9% FLUSH: 3.0000 mL | Freq: Once | INTRAVENOUS | Status: DC

## 2020-09-11 MED ORDER — ONDANSETRON HCL 4 MG/2ML IJ SOLN
INTRAMUSCULAR | Status: AC
  Administered 2020-09-11: 4 mg via INTRAVENOUS
  Filled 2020-09-11: qty 2

## 2020-09-11 MED ORDER — METOCLOPRAMIDE HCL 10 MG PO TABS: 5.0000 mg | ORAL_TABLET | Freq: Four times a day (QID) | ORAL | 0 refills | Status: DC | PRN

## 2020-09-11 MED ORDER — FENTANYL CITRATE (PF) 100 MCG/2ML IJ SOLN: 100.0000 ug | Freq: Once | INTRAMUSCULAR | Status: DC

## 2020-09-11 MED ORDER — DEXAMETHASONE SODIUM PHOSPHATE 4 MG/ML IJ SOLN
4.0000 mg | Freq: Once | INTRAMUSCULAR | Status: AC
  Administered 2020-09-11: 4 mg via INTRAVENOUS
  Filled 2020-09-11: qty 1

## 2020-09-11 MED ORDER — IOHEXOL 350 MG/ML SOLN
100.0000 mL | Freq: Once | INTRAVENOUS | Status: AC | PRN
  Administered 2020-09-11: 100 mL via INTRAVENOUS

## 2020-09-11 NOTE — ED Provider Notes (Signed)
[facility] EMERGENCY DEPARTMENT Provider Note   CSN: [id number] Arrival date & time: 09/11/20  1343     History CC:  Headache  Ashley Lindsey is a 60 y.o. female w/ hx of complex migraines presenting to ED with headache and facial droop.  Patient reports she awoke with significant frontal headache at 0600 this morning.  She went to church, but developed stroke-like symptoms including right sided facial droop.  She arrives with EMS as a CODE stroke.  Neurologist Dr Rory Percy present at bedside.  Patient follows with neurology for migraines, but has not been on her migraine medications recently.  She reports a history of chronic back pain.  No new chest pain.  HPI     Past Medical History:  Diagnosis Date  . Abdominal cramps   . Arthritis   . Cancer [facility])    s/p radiation- last dose 6/12//has been off Tamoxifen 8/12  . Chronic headaches   . Costochondritis   . Fatigue   . Fibromyalgia 03/06/2013  . Lymphedema    RIGHT ARM-////  STATES USE LEFT ARM FOR BP'S  . Migraines   . Muscle spasms of head and/or neck    following to the breast  . Passed out    4th of july  . Personal history of radiation therapy 2010   lt breast   . S/P radiation therapy 08/19/07 - 10/10/07   Left Breast/5040 cGy/28 fractions with Boost for a toatl dose of 6300 dGy  . S/P radiation therapy 08/14/10 -10/03/10   right breast  . Sleep apnea    STOP BANG SCORE 5    Patient Active Problem List   Diagnosis Date Noted  . Bilateral primary osteoarthritis of knee 02/09/2020  . Chronic intermittent hypoxia with obstructive sleep apnea 08/24/2019  . Chronic intractable headache 07/14/2019  . Morning headache 07/14/2019  . Loud snoring 07/14/2019  . Super obesity 07/14/2019  . Sleeps in sitting position due to orthopnea 07/14/2019  . Chest pain due to GERD 07/14/2019  . Nocturia more than twice per night 07/14/2019  . Unilateral primary osteoarthritis, right knee 05/26/2019  . Chronic  migraine without aura without status migrainosus, not intractable 05/13/2019  . Class 3 severe obesity due to excess calories without serious comorbidity with body mass index (BMI) of 40.0 to 44.9 in adult ([place]) 08/05/2018  . Depression 08/05/2018  . Chronic nonintractable headache 08/05/2018  . Elevated blood-pressure reading without diagnosis of hypertension 06/06/2018  . Chronic pain of right ankle 04/24/2018  . Breast cancer of lower-outer quadrant of right female breast ([place]) 12/02/2014  . Bronchospasm 09/21/2014  . Dyspnea 09/21/2014  . Chronic pain of right knee 09/21/2014  . Fibromyalgia 03/06/2013  . Dense breasts 03/06/2013  . Mastalgia 03/06/2013  . Back pain, lumbosacral 03/06/2013  . Lymphedema of arm 03/06/2013  . Atypical chest pain 01/06/2013  . Breast cancer of upper-inner quadrant of left female breast ([place]) 09/08/2012  . Family history of malignant neoplasm of breast 09/08/2012  . S/P radiation therapy   . History of breast cancer in female 08/13/2011    Past Surgical History:  Procedure Laterality Date  . ABDOMINAL HYSTERECTOMY     with left salpingooophorectomy  . BREAST LUMPECTOMY  2009/2012   LEFT/RIGHT with lymph node dissection  . KNEE ARTHROSCOPY     right  . OOPHORECTOMY  2013     OB History   No obstetric history on file.     Family History  Problem Relation Age  of Onset  . Breast cancer Mother 29  . Heart disease Father   . Prostate cancer Maternal Uncle 78  . Cancer Maternal Aunt 63       d. from "female cancer" possibly cervical  . Breast cancer Maternal Aunt 60  . Prostate cancer Paternal Uncle   . Prostate cancer Maternal Grandfather 60  . Prostate cancer Maternal Uncle 80  . Prostate cancer Paternal Uncle   . Breast cancer Cousin 30       mat 1st cousin    Social History   Tobacco Use  . Smoking status: Former Smoker    Packs/day: 0.25    Years: 1.00    Pack years: 0.25    Types: Cigarettes  . Smokeless tobacco: Never  Used  Vaping Use  . Vaping Use: Never used  Substance Use Topics  . Alcohol use: Not Currently  . Drug use: No    Home Medications Prior to Admission medications   Medication Sig Start Date End Date Taking? Authorizing Provider  acetaminophen (TYLENOL) 650 MG CR tablet Take 650 mg by mouth every 8 (eight) hours as needed for pain.    [provider]  albuterol (VENTOLIN HFA) 108 (90 Base) MCG/ACT inhaler INHALE 1-2 PUFFS INTO THE LUNGS EVERY 6 (SIX) HOURS AS NEEDED FOR WHEEZING OR SHORTNESS OF BREATH. 05/03/20   Serena Croissant, MD  benzonatate (TESSALON PERLES) 100 MG capsule Take 1 capsule (100 mg total) by mouth every 6 (six) hours as needed for cough. 05/02/20 05/02/21  [name], FNP  Bisacodyl (DULCOLAX PO) Take 1 capsule by mouth daily as needed (constipation).     [provider]  cholecalciferol 5000 units TABS Take 1 tablet (5,000 Units total) by mouth 2 (two) times daily. Patient taking differently: Take 5,000 Units by mouth daily. 07/21/15   Serena Croissant, MD  cyclobenzaprine (FLEXERIL) 10 MG tablet TAKE 1 TABLET BY MOUTH EVERY DAY AS NEEDED 08/23/20   [name], FNP  DEXILANT 60 MG capsule TAKE 1 CAPSULE BY MOUTH EVERY DAY 08/30/20   [name], FNP  diphenhydrAMINE (BENADRYL) 25 mg capsule Take 25 mg by mouth 2 (two) times daily.    [provider]  doxycycline (VIBRAMYCIN) 100 MG capsule Take 1 capsule (100 mg total) by mouth 2 (two) times daily. 08/03/20   [name], FNP  Erenumab-aooe (AIMOVIG) 140 MG/ML SOAJ Inject 140 mg into the skin every 30 (thirty) days. 06/18/19   Anson Fret, MD  fish oil-omega-3 fatty acids 1000 MG capsule Take 1 g by mouth daily.    [provider]  Flaxseed, Linseed, (FLAXSEED OIL MAX STR PO) Take 1 capsule by mouth daily.     [provider]  fluconazole (DIFLUCAN) 150 MG tablet Take 1 tablet by mouth today and then repeat in 5 days 08/26/20   [name], FNP  gabapentin (NEURONTIN) 300 MG  capsule Take 1 capsule (300 mg total) by mouth 3 (three) times daily. Medication may be dispensed to patient today. If you have questions, please call Dr. Carlis Abbott at [phone]. 10/01/19   Horton Chin, MD  hydrochlorothiazide (HYDRODIURIL) 12.5 MG tablet TAKE 1 TABLET BY MOUTH EVERY DAY 08/19/20   [name], FNP  hydrochlorothiazide (HYDRODIURIL) 25 MG tablet TAKE 1 TABLET BY MOUTH EVERY DAY 08/11/20   [name], FNP  Iron-FA-B Cmp-C-Biot-Probiotic (FUSION PLUS) CAPS Take 1 capsule by mouth daily. 06/23/18   [name], FNP  Magnesium 300 MG CAPS Take 1 capsule (300 mg total) by mouth  daily. Take with evening meal 08/03/20   [name], FNP  meloxicam (MOBIC) 7.5 MG tablet TAKE 1 TABLET BY MOUTH EVERY DAY 08/19/20   [name], FNP  Multiple Vitamins-Minerals (MULTIVITAMIN & MINERAL PO) Take 1 tablet by mouth daily.    [provider]  omeprazole (PRILOSEC) 20 MG capsule Take 20 mg by mouth daily.    [provider]  ondansetron (ZOFRAN) 4 MG tablet TAKE 1 TABLET BY MOUTH DAILY AS NEEDED FOR NAUSEA OR VOMITING 08/19/20   [name], FNP  promethazine (PHENERGAN) 25 MG tablet Take 1 tablet (25 mg total) by mouth every 6 (six) hours as needed for nausea or vomiting. 12/02/19   [name], FNP  propranolol (INDERAL) 20 MG tablet TAKE 1 TABLET BY MOUTH TWICE A DAY 05/03/20   [name], FNP  rizatriptan (MAXALT-MLT) 10 MG disintegrating tablet Take 1 tablet (10 mg total) by mouth as needed for migraine. May repeat in 2 hours if needed 07/01/19   Melvenia Beam, MD  senna (SENOKOT) 8.6 MG tablet Take 1 tablet by mouth daily as needed for constipation.     [provider]  SYMBICORT 80-4.5 MCG/ACT inhaler INHALE 2 PUFFS INTO THE LUNGS IN THE MORNING AND AT BEDTIME. 05/05/20   [name], FNP  traMADol (ULTRAM) 50 MG tablet Take 1 tablet (50 mg total) by mouth 2 (two) times daily as needed. 04/13/20   Garald Balding, MD  triamcinolone cream (KENALOG)  0.1 % Apply 1 application topically 2 (two) times daily. Apply for up to one week. Patient taking differently: Apply 1 application topically 2 (two) times daily as needed (rash). 12/29/16   Vanessa Kick, MD  UNABLE TO FIND Rx: L8000-Post Surgical Bra (Quantity: 6) V7616- Silicone Breast Prosthesis (Quantity: 2) Dx: 174.9; Bilateral partial mastectomy Replacement due to hot flashes since previous items were too heavy and hot 01/20/13   Fanny Skates, MD  venlafaxine XR (EFFEXOR-XR) 150 MG 24 hr capsule TAKE 1 CAPSULE BY MOUTH NIGHTLY WITH 75 MG CAPSULE, TOTAL OF 225 MG NIGHTLY 08/02/20   Nicholas Lose, MD  venlafaxine XR (EFFEXOR-XR) 75 MG 24 hr capsule TAKE 1 CAPSULE BY MOUTH DAILY IN COMBINATION WITH XR 150 MG (TO EQUAL 225 MG TOTAL DAILY). 08/15/20   Nicholas Lose, MD  vitamin E 200 UNIT capsule Take 200 Units by mouth daily.    [provider]    Allergies    Aspirin and Penicillins  Review of Systems   Review of Systems  Constitutional: Negative for chills and fever.  Eyes: Positive for photophobia and visual disturbance.  Respiratory: Negative for cough and shortness of breath.   Cardiovascular: Negative for chest pain and palpitations.  Gastrointestinal: Positive for nausea and vomiting. Negative for abdominal pain.  Genitourinary: Negative for dysuria and hematuria.  Musculoskeletal: Positive for arthralgias and back pain.  Skin: Negative for color change and rash.  Neurological: Positive for dizziness, seizures, facial asymmetry, weakness, light-headedness, numbness and headaches.  All other systems reviewed and are negative.   Physical Exam Updated Vital Signs There were no vitals taken for this visit.  Physical Exam Constitutional:      General: She is not in acute distress.    Appearance: She is obese.  HENT:     Head: Normocephalic and atraumatic.  Eyes:     Conjunctiva/sclera: Conjunctivae normal.     Pupils: Pupils are equal, round, and reactive to light.   Cardiovascular:     Rate and Rhythm: Normal rate and regular  rhythm.     Pulses: Normal pulses.  Pulmonary:     Effort: Pulmonary effort is normal. No respiratory distress.  Abdominal:     General: There is no distension.     Tenderness: There is no abdominal tenderness.  Skin:    General: Skin is warm and dry.  Neurological:     Mental Status: She is alert.     GCS: GCS eye subscore is 4. GCS verbal subscore is 5. GCS motor subscore is 6.     Sensory: Sensation is intact.     Motor: Motor function is intact.     Comments: Mild right lower facial droop     ED Results / Procedures / Treatments   Labs (all labs ordered are listed, but only abnormal results are displayed) Labs Reviewed  I-STAT CHEM 8, ED - Abnormal; Notable for the following components:      Result Value   BUN 6 (*)    Glucose, Bld 107 (*)    Calcium, Ion 1.14 (*)    Hemoglobin 15.6 (*)    All other components within normal limits  CBG MONITORING, ED - Abnormal; Notable for the following components:   Glucose-Capillary 109 (*)    All other components within normal limits  CBC  DIFFERENTIAL  COMPREHENSIVE METABOLIC PANEL  PROTIME-INR  APTT    EKG None  Radiology No results found.  Procedures Procedures   Medications Ordered in ED Medications  sodium chloride flush (NS) 0.9 % injection 3 mL (has no administration in time range)  iohexol (OMNIPAQUE) 350 MG/ML injection 100 mL (100 mLs Intravenous Contrast Given 09/11/20 1359)    ED Course  I have reviewed the triage vital signs and the nursing notes.  Pertinent labs & imaging results that were available during my care of the patient were reviewed by me and considered in my medical decision making (see chart for details).  Code stroke evaluation for headache and facial droop CTH on arrival showing no acute infarct, no evidence of SAH or hemorrhage within 6 hours of symptoms onset.  Neurologist consulted - reviewed CTH and perfusion study,  which are unremarkable.  Per Dr Johny Chess assessment this presentation is consistent with a complex migraine.  He did not recommend further neuro imaging or workup up, but advised continued treatment for migraines in the ED.  The patient was given IV migraine medications with significant improvement in all of her symptoms.  She was signed out to Dr Ralene Bathe EDP pending reassessment after completion of her medications.  Dr Rory Percy recommended prescription of reglan and benadryl at home, which was provided, and he encouraged the patient to follow back up with neurology to resume her migraine medications.  I personally reviewed her imaging, labs and workup.  No evidence of significant anemia, dehydration, ACS, PE, or aortic dissection per her clinical evaluation today.  ECG reviewed and shows no signs of acute ischemia.    Clinical Course as of 09/11/20 1746  Sun Sep 11, 2020  1400 Pt assessed on arrival - airway intact, GCS 15, vitals stable- at CT now, Dr Rory Percy at bedside from neurology [MT]  1413 Patient ordered fentanyl 50 mcg (clarified and adjusted initial 100 mcg dose to 50 which was given) as she complains of chronic back pain which is severely worsened with laying on the hard table in the CT scanner.  She is in a difficult time laying still.  It is very positional, and I suspect related to spinal issue. [MT]  Clinical Course User Index [MT] Namish Krise, Carola Rhine, MD    Final Clinical Impression(s) / ED Diagnoses Final diagnoses:  None    Rx / DC Orders ED Discharge Orders    None       Earlyne Feeser, Carola Rhine, MD 09/11/20 [phone]

## 2020-09-11 NOTE — Consult Note (Addendum)
Neurology Consultation  Reason for Consult: Code stroke  Referring Physician: Dr. Langston Masker   CC: headache, right side weakness, and right side droop  History is obtained from: patient, EMS and medical record   HPI: Ashley Lindsey is a 60 y.o. female with past medical history of HTN,  migraines, depression, obesity, OSA, fibromyalgia, breast Ca, chronic back pain who presents to the ED via EMS for worsening headache, right side weakness and right side facial droop while at church.  Per patient she woke with a headache @ 0800 and as the day proceeded headache worsened and then developed stroke like symptoms.  On arrival to ED she appears to have a lot of anxiety. She complains of severe headache and photophobia and nausea.  Patient has seen Dr. Jaynee Eagles in the past, last visit in February 2053for chronic migraines and was given AIMOVIG.  She states she has not been taking her migraine medicine because her PCP told her not to take anymore. Per PCP note in April 2022, she has been under a lot due to caring for her Uncle.   In the ED she was given zofran, and migraine cocktail of benadryl, decadron, fentanyl, and reglan.   LKW: 09/10/2020 prior to bed last pm  tpa given?: no, outside window, not a stroke  Premorbid modified Rankin scale (mRS):  0-Completely asymptomatic and back to baseline post-stroke  ROS: Full ROS was performed and is negative except as noted in the HPI.   Past Medical History:  Diagnosis Date  . Abdominal cramps   . Arthritis   . Cancer [facility])    s/p radiation- last dose 6/12//has been off Tamoxifen 8/12  . Chronic headaches   . Costochondritis   . Fatigue   . Fibromyalgia 03/06/2013  . Lymphedema    RIGHT ARM-////  STATES USE LEFT ARM FOR BP'S  . Migraines   . Muscle spasms of head and/or neck    following to the breast  . Passed out    4th of july  . Personal history of radiation therapy 2010   lt breast   . S/P radiation therapy 08/19/07 - 10/10/07   Left Breast/5040  cGy/28 fractions with Boost for a toatl dose of 6300 dGy  . S/P radiation therapy 08/14/10 -10/03/10   right breast  . Sleep apnea    STOP BANG SCORE 5    Family History  Problem Relation Age of Onset  . Breast cancer Mother 41  . Heart disease Father   . Prostate cancer Maternal Uncle 78  . Cancer Maternal Aunt 63       d. from "female cancer" possibly cervical  . Breast cancer Maternal Aunt 60  . Prostate cancer Paternal Uncle   . Prostate cancer Maternal Grandfather 45  . Prostate cancer Maternal Uncle 80  . Prostate cancer Paternal Uncle   . Breast cancer Cousin 27       mat 1st cousin    Social History:   reports that she has quit smoking. Her smoking use included cigarettes. She has a 0.25 pack-year smoking history. She has never used smokeless tobacco. She reports previous alcohol use. She reports that she does not use drugs.  Medications  Current Facility-Administered Medications:  .  dexamethasone (DECADRON) injection 4 mg, 4 mg, Intravenous, Once, Trifan, Carola Rhine, MD .  diphenhydrAMINE (BENADRYL) injection 12.5 mg, 12.5 mg, Intravenous, Once, Trifan, Carola Rhine, MD .  metoCLOPramide (REGLAN) injection 10 mg, 10 mg, Intravenous, Once, Trifan, Carola Rhine, MD .  sodium chloride flush (NS) 0.9 % injection 3 mL, 3 mL, Intravenous, Once, Trifan, Carola Rhine, MD  Current Outpatient Medications:  .  acetaminophen (TYLENOL) 650 MG CR tablet, Take 650 mg by mouth every 8 (eight) hours as needed for pain., Disp: , Rfl:  .  albuterol (VENTOLIN HFA) 108 (90 Base) MCG/ACT inhaler, INHALE 1-2 PUFFS INTO THE LUNGS EVERY 6 (SIX) HOURS AS NEEDED FOR WHEEZING OR SHORTNESS OF BREATH., Disp: 6.7 each, Rfl: 1 .  benzonatate (TESSALON PERLES) 100 MG capsule, Take 1 capsule (100 mg total) by mouth every 6 (six) hours as needed for cough., Disp: 30 capsule, Rfl: 1 .  Bisacodyl (DULCOLAX PO), Take 1 capsule by mouth daily as needed (constipation). , Disp: , Rfl:  .  cholecalciferol 5000 units  TABS, Take 1 tablet (5,000 Units total) by mouth 2 (two) times daily. (Patient taking differently: Take 5,000 Units by mouth daily.), Disp: , Rfl:  .  cyclobenzaprine (FLEXERIL) 10 MG tablet, TAKE 1 TABLET BY MOUTH EVERY DAY AS NEEDED, Disp: 90 tablet, Rfl: 0 .  DEXILANT 60 MG capsule, TAKE 1 CAPSULE BY MOUTH EVERY DAY, Disp: 90 capsule, Rfl: 1 .  diphenhydrAMINE (BENADRYL) 25 mg capsule, Take 25 mg by mouth 2 (two) times daily., Disp: , Rfl:  .  doxycycline (VIBRAMYCIN) 100 MG capsule, Take 1 capsule (100 mg total) by mouth 2 (two) times daily., Disp: 14 capsule, Rfl: 0 .  Erenumab-aooe (AIMOVIG) 140 MG/ML SOAJ, Inject 140 mg into the skin every 30 (thirty) days., Disp: 1 pen, Rfl: 11 .  fish oil-omega-3 fatty acids 1000 MG capsule, Take 1 g by mouth daily., Disp: , Rfl:  .  Flaxseed, Linseed, (FLAXSEED OIL MAX STR PO), Take 1 capsule by mouth daily. , Disp: , Rfl:  .  fluconazole (DIFLUCAN) 150 MG tablet, Take 1 tablet by mouth today and then repeat in 5 days, Disp: 2 tablet, Rfl: 0 .  gabapentin (NEURONTIN) 300 MG capsule, Take 1 capsule (300 mg total) by mouth 3 (three) times daily. Medication may be dispensed to patient today. If you have questions, please call Dr. Ranell Patrick at [phone]., Disp: 90 capsule, Rfl: 3 .  hydrochlorothiazide (HYDRODIURIL) 12.5 MG tablet, TAKE 1 TABLET BY MOUTH EVERY DAY, Disp: 90 tablet, Rfl: 0 .  hydrochlorothiazide (HYDRODIURIL) 25 MG tablet, TAKE 1 TABLET BY MOUTH EVERY DAY, Disp: 90 tablet, Rfl: 1 .  Iron-FA-B Cmp-C-Biot-Probiotic (FUSION PLUS) CAPS, Take 1 capsule by mouth daily., Disp: 30 capsule, Rfl: 3 .  Magnesium 300 MG CAPS, Take 1 capsule (300 mg total) by mouth daily. Take with evening meal, Disp: 90 capsule, Rfl: 1 .  meloxicam (MOBIC) 7.5 MG tablet, TAKE 1 TABLET BY MOUTH EVERY DAY, Disp: 90 tablet, Rfl: 0 .  Multiple Vitamins-Minerals (MULTIVITAMIN & MINERAL PO), Take 1 tablet by mouth daily., Disp: , Rfl:  .  omeprazole (PRILOSEC) 20 MG capsule,  Take 20 mg by mouth daily., Disp: , Rfl:  .  ondansetron (ZOFRAN) 4 MG tablet, TAKE 1 TABLET BY MOUTH DAILY AS NEEDED FOR NAUSEA OR VOMITING, Disp: 30 tablet, Rfl: 1 .  promethazine (PHENERGAN) 25 MG tablet, Take 1 tablet (25 mg total) by mouth every 6 (six) hours as needed for nausea or vomiting., Disp: 30 tablet, Rfl: 0 .  propranolol (INDERAL) 20 MG tablet, TAKE 1 TABLET BY MOUTH TWICE A DAY, Disp: 180 tablet, Rfl: 0 .  rizatriptan (MAXALT-MLT) 10 MG disintegrating tablet, Take 1 tablet (10 mg total) by mouth as needed for migraine. May repeat  in 2 hours if needed, Disp: 9 tablet, Rfl: 11 .  senna (SENOKOT) 8.6 MG tablet, Take 1 tablet by mouth daily as needed for constipation. , Disp: , Rfl:  .  SYMBICORT 80-4.5 MCG/ACT inhaler, INHALE 2 PUFFS INTO THE LUNGS IN THE MORNING AND AT BEDTIME., Disp: 30.6 each, Rfl: 1 .  traMADol (ULTRAM) 50 MG tablet, Take 1 tablet (50 mg total) by mouth 2 (two) times daily as needed., Disp: 30 tablet, Rfl: 0 .  triamcinolone cream (KENALOG) 0.1 %, Apply 1 application topically 2 (two) times daily. Apply for up to one week. (Patient taking differently: Apply 1 application topically 2 (two) times daily as needed (rash).), Disp: 30 g, Rfl: 0 .  UNABLE TO FIND, Rx: L8000-Post Surgical Bra (Quantity: 6) [id number]- Silicone Breast Prosthesis (Quantity: 2) Dx: 174.9; Bilateral partial mastectomy Replacement due to hot flashes since previous items were too heavy and hot, Disp: 1 each, Rfl: 0 .  venlafaxine XR (EFFEXOR-XR) 150 MG 24 hr capsule, TAKE 1 CAPSULE BY MOUTH NIGHTLY WITH 75 MG CAPSULE, TOTAL OF 225 MG NIGHTLY, Disp: 90 capsule, Rfl: 0 .  venlafaxine XR (EFFEXOR-XR) 75 MG 24 hr capsule, TAKE 1 CAPSULE BY MOUTH DAILY IN COMBINATION WITH XR 150 MG (TO EQUAL 225 MG TOTAL DAILY)., Disp: 90 capsule, Rfl: 0 .  vitamin E 200 UNIT capsule, Take 200 Units by mouth daily., Disp: , Rfl:    Exam: Current vital signs: BP 125/80 (BP Location: Right Arm)   Pulse 77   Resp 20   Ht  5\' 2"  (1.575 m)   Wt 133.7 kg   SpO2 98%   BMI 53.91 kg/m  Vital signs in last 24 hours: Pulse Rate:  [77] 77 (05/22 1432) Resp:  [20] 20 (05/22 1432) BP: (125)/(80) 125/80 (05/22 1432) SpO2:  [98 %] 98 % (05/22 1432) Weight:  [133.7 kg] 133.7 kg (05/22 1425)  GENERAL: Awake, alert, anxious HEENT: - Normocephalic and atraumatic, dry mm LUNGS - Clear to auscultation bilaterally with no wheezes CV - S1S2 RRR, no m/r/g, equal pulses bilaterally. ABDOMEN - Soft, nontender, nondistended with normoactive BS Ext: warm, well perfused, intact peripheral pulses, mild lower extremity  edema  NEURO:  Mental Status: AA&Ox3  Language: speech is clear.  Naming, repetition, fluency, and comprehension intact. Cranial Nerves: PERRL 68mm/brisk. EOMI, visual fields full, mild right facial asymmetry, facial sensation intact, hearing intact, tongue/uvula/soft palate midline, normal sternocleidomastoid and trapezius muscle strength. No evidence of tongue atrophy or fibrillations Motor: right side weakness Tone: is normal and bulk is normal Sensation- Intact to light touch bilaterally Coordination: FTN intact bilaterally, no ataxia in BLE. Gait- deferred  NIHSS 1a Level of Conscious.: 0 1b LOC Questions: 0 1c LOC Commands: 0 2 Best Gaze: 0 3 Visual: 0 4 Facial Palsy: 1 5a Motor Arm - left: 0 5b Motor Arm - Right: 1 6a Motor Leg - Left: 0 6b Motor Leg - Right: 1 7 Limb Ataxia: 0 8 Sensory: 0 9 Best Language: 0 10 Dysarthria: 0 11 Extinct. and Inatten.: 0 TOTAL: 3   Labs I have reviewed labs in epic and the results pertinent to this consultation are:  CBC    Component Value Date/Time   WBC 10.2 09/11/2020 1349   RBC 5.11 09/11/2020 1349   HGB 15.6 (H) 09/11/2020 1353   HGB 12.7 02/03/2020 1243   HGB 11.5 (L) 01/07/2017 0920   HCT 46.0 09/11/2020 1353   HCT 40.4 02/03/2020 1243   HCT 37.6 01/07/2017 0920  PLT 368 09/11/2020 1349   PLT 356 02/03/2020 1243   MCV 89.2 09/11/2020  1349   MCV 84 02/03/2020 1243   MCV 85.6 01/07/2017 0920   MCH 26.0 09/11/2020 1349   MCHC 29.2 (L) 09/11/2020 1349   RDW 14.6 09/11/2020 1349   RDW 13.1 02/03/2020 1243   RDW 14.4 01/07/2017 0920   LYMPHSABS 3.0 09/11/2020 1349   LYMPHSABS 2.0 01/07/2017 0920   MONOABS 0.5 09/11/2020 1349   MONOABS 0.6 01/07/2017 0920   EOSABS 0.1 09/11/2020 1349   EOSABS 0.3 01/07/2017 0920   BASOSABS 0.0 09/11/2020 1349   BASOSABS 0.0 01/07/2017 0920    CMP     Component Value Date/Time   NA 139 09/11/2020 1353   NA 140 08/03/2020 1205   NA 139 01/07/2017 0920   K 3.7 09/11/2020 1353   K 4.1 01/07/2017 0920   CL 100 09/11/2020 1353   CL 105 12/26/2011 1538   CO2 26 08/03/2020 1205   CO2 25 01/07/2017 0920   GLUCOSE 107 (H) 09/11/2020 1353   GLUCOSE 111 01/07/2017 0920   GLUCOSE 103 (H) 12/26/2011 1538   BUN 6 (L) 09/11/2020 1353   BUN 14 08/03/2020 1205   BUN 8.7 01/07/2017 0920   CREATININE 0.60 09/11/2020 1353   CREATININE 0.75 03/03/2018 1445   CREATININE 0.8 01/07/2017 0920   CALCIUM 9.1 08/03/2020 1205   CALCIUM 8.9 01/07/2017 0920   PROT 7.2 02/17/2020 1129   PROT 6.9 02/03/2020 1243   PROT 7.0 01/07/2017 0920   ALBUMIN 3.8 02/17/2020 1129   ALBUMIN 4.3 02/03/2020 1243   ALBUMIN 3.5 01/07/2017 0920   AST 19 02/17/2020 1129   AST 26 03/03/2018 1445   AST 47 (H) 01/07/2017 0920   ALT 20 02/17/2020 1129   ALT 25 03/03/2018 1445   ALT 54 01/07/2017 0920   ALKPHOS 69 02/17/2020 1129   ALKPHOS 73 01/07/2017 0920   BILITOT 0.4 02/17/2020 1129   BILITOT 0.2 02/03/2020 1243   BILITOT <0.2 (L) 03/03/2018 1445   BILITOT 0.22 01/07/2017 0920   GFRNONAA >60 02/17/2020 1129   GFRNONAA >60 03/03/2018 1445   GFRAA 110 02/03/2020 1243   GFRAA >60 03/03/2018 1445   Imaging I have reviewed the images obtained: CT-scan of the brain No acute abnormality  CTA H/N with perfusion  CT Perf -negative for acute infarct or ischemia. Negative for intracranial large vessel  occlusion  Assessment:  60 y.o. female with past medical history of HTN,  migraines, depression, obesity, OSA, fibromyalgia, breast Ca, chronic back pain who presents to the ED via EMS for worsening headache, right side weakness and right side facial droop while at church.  Per patient she woke with a headache @ 0800 and as the day proceeded headache worsened and then developed stroke like symptoms.  On arrival to ED she appears to have a lot of anxiety. She complains of severe headache and photophobia and nausea. Has been prescribed  AIMOVIG.  She states she has not been taking her migraine medicine because her PCP told her not to take anymore.  Per records she has been under a lot of extra stress lately.   Impression: Complex Migraine with associated right side weakness.   - needs to follow up with outpatient neurologist for migraine regimen  - Treat headaches   - stress management  Beulah Gandy, DNP, ACNPC-AG    ATTENDING ADDENDUM Agree with the history and physical above Seen and examined as an acute code stroke for severe headache  followed by right-sided tingling numbness. Exam somewhat functional. Treated with migraine cocktail after CT head and CTA head and neck were negative. Feeling nearly back to baseline with some right-sided tingling remaining along with which she reports is right leg heaviness.  Examination with significant improvement in the right upper and lower extremity weakness and subjective sensation. No evidence of dissection or aneurysm on vascular imaging. Can be discharged home on Benadryl and Reglan cocktail as needed. Will follow up with Dr. Lavell Anchors for chronic headache management. Also reports extreme stressors- recommended psychological counseling and PCP follow-up. Plan discussed with Dr. Ayesha Rumpf in the ER  -- Amie Portland, MD Neurologist [facility] Pager: [phone]

## 2020-09-11 NOTE — ED Provider Notes (Signed)
Patient presented to the emergency department as a code stroke for right facial droop. She has been evaluated by neurology and deemed not to be a TPA candidate. There is concern for complicated migraine and she was treated with headache cocktail. On repeat assessment patient after medications she states her headache is improving. She has minimal droop to the right upper eyelid on reassessment. EOM I. Equal strength to the upper and lower face on testing against resistance. Given her improvement in symptoms plan to discharge home with outpatient resources and return precautions.   Quintella Reichert, MD 09/11/20 1550

## 2020-09-12 ENCOUNTER — Other Ambulatory Visit: Payer: Self-pay | Admitting: Neurology

## 2020-09-12 ENCOUNTER — Other Ambulatory Visit: Payer: Self-pay | Admitting: Hematology and Oncology

## 2020-09-12 ENCOUNTER — Other Ambulatory Visit: Payer: Self-pay | Admitting: Nurse Practitioner

## 2020-09-12 DIAGNOSIS — C50419 Malignant neoplasm of upper-outer quadrant of unspecified female breast: Secondary | ICD-10-CM

## 2020-09-12 NOTE — Telephone Encounter (Signed)
Cyclobenzaprine refill 

## 2020-09-14 ENCOUNTER — Encounter: Payer: Self-pay | Admitting: Nurse Practitioner

## 2020-09-14 ENCOUNTER — Ambulatory Visit (INDEPENDENT_AMBULATORY_CARE_PROVIDER_SITE_OTHER): Payer: Medicare Other | Admitting: Nurse Practitioner

## 2020-09-14 ENCOUNTER — Other Ambulatory Visit: Payer: Self-pay

## 2020-09-14 VITALS — BP 134/80 | HR 84 | Temp 98.4°F | Ht 61.2 in | Wt 299.2 lb

## 2020-09-14 DIAGNOSIS — K5909 Other constipation: Secondary | ICD-10-CM | POA: Diagnosis not present

## 2020-09-14 DIAGNOSIS — G4489 Other headache syndrome: Secondary | ICD-10-CM | POA: Diagnosis not present

## 2020-09-14 DIAGNOSIS — H02401 Unspecified ptosis of right eyelid: Secondary | ICD-10-CM | POA: Diagnosis not present

## 2020-09-14 DIAGNOSIS — R29898 Other symptoms and signs involving the musculoskeletal system: Secondary | ICD-10-CM

## 2020-09-14 NOTE — Progress Notes (Signed)
I,Ashley Lindsey [employer] as a Education administrator for [employer], FNP.,have documented all relevant documentation on the behalf of Ashley Brine, FNP,as directed by  Ashley Brine, FNP while in the presence of Ashley Lindsey, Ashley Lindsey. This visit occurred during the SARS-CoV-2 public health emergency.  Safety protocols were in place, including screening questions prior to the visit, additional usage of staff PPE, and extensive cleaning of exam room while observing appropriate contact time as indicated for disinfecting solutions.  Subjective:     Patient ID: Ashley Lindsey , female    DOB: May 10, 1960 , 60 y.o.   MRN: [id number]   Chief Complaint  Patient presents with  . ER F/U    HPI  Patient stated she went to the hospital for a headache that she had for 4 days. She just wasn't feeling well overall. She also reports passed out at church and she was taken to the hospital from there. She stated they think she may have had a stroke. She stated her right arm is just now starting to function a little bit better. She has not taken her amovig in the last several months. She continues to be under increased stress with caring for her uncle. She also admits she does not have a good support system to care for her uncle and mother together. She is in the process of getting him placed. She is now having right eye lid drooping. She has taken maxalt two times since being discharged.   She reports she was in church and passed out, they were unable to get her blood pressure and at that time EMS was called.      Past Medical History:  Diagnosis Date  . Abdominal cramps   . Arthritis   . Cancer [facility])    s/p radiation- last dose 6/12//has been off Tamoxifen 8/12  . Chronic headaches   . Costochondritis   . Fatigue   . Fibromyalgia 03/06/2013  . Lymphedema    RIGHT ARM-////  STATES USE LEFT ARM FOR BP'S  . Migraines   . Muscle spasms of head and/or neck    following to the breast  . Passed out    4th of july  .  Personal history of radiation therapy 2010   lt breast   . S/P radiation therapy 08/19/07 - 10/10/07   Left Breast/5040 cGy/28 fractions with Boost for a toatl dose of 6300 dGy  . S/P radiation therapy 08/14/10 -10/03/10   right breast  . Sleep apnea    STOP BANG SCORE 5     Family History  Problem Relation Age of Onset  . Breast cancer Mother 2  . Heart disease Father   . Prostate cancer Maternal Uncle 78  . Cancer Maternal Aunt 63       d. from "female cancer" possibly cervical  . Breast cancer Maternal Aunt 60  . Prostate cancer Paternal Uncle   . Prostate cancer Maternal Grandfather 35  . Prostate cancer Maternal Uncle 80  . Prostate cancer Paternal Uncle   . Breast cancer Cousin 7       mat 1st cousin     Current Outpatient Medications:  .  acetaminophen (TYLENOL) 650 MG CR tablet, Take 650 mg by mouth every 8 (eight) hours as needed for pain., Disp: , Rfl:  .  albuterol (VENTOLIN HFA) 108 (90 Base) MCG/ACT inhaler, INHALE 1-2 PUFFS INTO THE LUNGS EVERY 6 (SIX) HOURS AS NEEDED FOR WHEEZING OR SHORTNESS OF BREATH., Disp: 6.7 each, Rfl: 1 .  benzonatate (TESSALON PERLES) 100 MG capsule, Take 1 capsule (100 mg total) by mouth every 6 (six) hours as needed for cough., Disp: 30 capsule, Rfl: 1 .  Bisacodyl (DULCOLAX PO), Take 1 capsule by mouth daily as needed (constipation). , Disp: , Rfl:  .  cholecalciferol 5000 units TABS, Take 1 tablet (5,000 Units total) by mouth 2 (two) times daily. (Patient taking differently: Take 5,000 Units by mouth daily.), Disp: , Rfl:  .  cyclobenzaprine (FLEXERIL) 10 MG tablet, TAKE 1 TABLET BY MOUTH EVERY DAY AS NEEDED (Patient taking differently: Take 10 mg by mouth 3 (three) times daily as needed for muscle spasms.), Disp: 90 tablet, Rfl: 0 .  DEXILANT 60 MG capsule, TAKE 1 CAPSULE BY MOUTH EVERY DAY, Disp: 90 capsule, Rfl: 1 .  diphenhydrAMINE (BENADRYL) 25 MG tablet, Take 1 tablet (25 mg total) by mouth every 6 (six) hours as needed for up to 20  doses (migraine). Take with reglan for migraine headache, Disp: 20 tablet, Rfl: 0 .  fish oil-omega-3 fatty acids 1000 MG capsule, Take 1 g by mouth daily., Disp: , Rfl:  .  Flaxseed, Linseed, (FLAXSEED OIL MAX STR PO), Take 1 capsule by mouth daily. , Disp: , Rfl:  .  gabapentin (NEURONTIN) 300 MG capsule, Take 1 capsule (300 mg total) by mouth 3 (three) times daily. Medication may be dispensed to patient today. If you have questions, please call Dr. Ranell Patrick at [phone]., Disp: 90 capsule, Rfl: 3 .  hydrochlorothiazide (HYDRODIURIL) 25 MG tablet, TAKE 1 TABLET BY MOUTH EVERY DAY, Disp: 90 tablet, Rfl: 1 .  Iron-FA-B Cmp-C-Biot-Probiotic (FUSION PLUS) CAPS, Take 1 capsule by mouth daily., Disp: 30 capsule, Rfl: 3 .  Magnesium 300 MG CAPS, Take 1 capsule (300 mg total) by mouth daily. Take with evening meal, Disp: 90 capsule, Rfl: 1 .  meloxicam (MOBIC) 7.5 MG tablet, TAKE 1 TABLET BY MOUTH EVERY DAY, Disp: 90 tablet, Rfl: 0 .  metoCLOPramide (REGLAN) 10 MG tablet, Take 0.5 tablets (5 mg total) by mouth every 6 (six) hours as needed for up to 5 days for nausea., Disp: 10 tablet, Rfl: 0 .  Multiple Vitamins-Minerals (MULTIVITAMIN & MINERAL PO), Take 1 tablet by mouth daily., Disp: , Rfl:  .  omeprazole (PRILOSEC) 20 MG capsule, Take 20 mg by mouth daily., Disp: , Rfl:  .  ondansetron (ZOFRAN) 4 MG tablet, TAKE 1 TABLET BY MOUTH DAILY AS NEEDED FOR NAUSEA OR VOMITING, Disp: 30 tablet, Rfl: 1 .  propranolol (INDERAL) 20 MG tablet, TAKE 1 TABLET BY MOUTH TWICE A DAY, Disp: 180 tablet, Rfl: 0 .  rizatriptan (MAXALT-MLT) 10 MG disintegrating tablet, TAKE 1 TABLET BY MOUTH AS NEEDED FOR MIGRAINE. MAY REPEAT IN 2 HOURS IF NEEDED, Disp: 9 tablet, Rfl: 0 .  senna (SENOKOT) 8.6 MG tablet, Take 1 tablet by mouth daily as needed for constipation. , Disp: , Rfl:  .  SYMBICORT 80-4.5 MCG/ACT inhaler, INHALE 2 PUFFS INTO THE LUNGS IN THE MORNING AND AT BEDTIME., Disp: 30.6 each, Rfl: 1 .  triamcinolone cream  (KENALOG) 0.1 %, Apply 1 application topically 2 (two) times daily. Apply for up to one week. (Patient taking differently: Apply 1 application topically 2 (two) times daily as needed (rash).), Disp: 30 g, Rfl: 0 .  UNABLE TO FIND, Rx: L8000-Post Surgical Bra (Quantity: 6) X1062- Silicone Breast Prosthesis (Quantity: 2) Dx: 174.9; Bilateral partial mastectomy Replacement due to hot flashes since previous items were too heavy and hot, Disp: 1 each, Rfl: 0 .  venlafaxine XR (EFFEXOR-XR) 150 MG 24 hr capsule, TAKE 1 CAPSULE BY MOUTH NIGHTLY WITH 75 MG CAPSULE (TOTAL OF 225 MG NIGHTLY), Disp: 90 capsule, Rfl: 0 .  venlafaxine XR (EFFEXOR-XR) 75 MG 24 hr capsule, TAKE 1 CAPSULE BY MOUTH DAILY IN COMBINATION WITH XR 150 MG (TO EQUAL 225 MG TOTAL DAILY)., Disp: 90 capsule, Rfl: 0 .  vitamin E 200 UNIT capsule, Take 200 Units by mouth daily., Disp: , Rfl:  .  doxycycline (VIBRAMYCIN) 100 MG capsule, Take 1 capsule (100 mg total) by mouth 2 (two) times daily. (Patient not taking: No sig reported), Disp: 14 capsule, Rfl: 0 .  Erenumab-aooe (AIMOVIG) 140 MG/ML SOAJ, Inject 140 mg into the skin every 30 (thirty) days. (Patient not taking: No sig reported), Disp: 1 pen, Rfl: 11 .  fluconazole (DIFLUCAN) 150 MG tablet, Take 1 tablet by mouth today and then repeat in 5 days (Patient not taking: No sig reported), Disp: 2 tablet, Rfl: 0 .  hydrochlorothiazide (HYDRODIURIL) 12.5 MG tablet, TAKE 1 TABLET BY MOUTH EVERY DAY (Patient not taking: No sig reported), Disp: 90 tablet, Rfl: 0 .  promethazine (PHENERGAN) 25 MG tablet, Take 1 tablet (25 mg total) by mouth every 6 (six) hours as needed for nausea or vomiting. (Patient not taking: No sig reported), Disp: 30 tablet, Rfl: 0 .  traMADol (ULTRAM) 50 MG tablet, Take 1 tablet (50 mg total) by mouth 2 (two) times daily as needed. (Patient not taking: No sig reported), Disp: 30 tablet, Rfl: 0   Allergies  Allergen Reactions  . Aspirin     unknown  . Penicillins Hives and  Swelling    Has patient had a PCN reaction causing immediate rash, facial/tongue/throat swelling, SOB or lightheadedness with hypotension: Yes Has patient had a PCN reaction causing severe rash involving mucus membranes or skin necrosis: Yes Has patient had a PCN reaction that required hospitalization Yes Has patient had a PCN reaction occurring within the last 10 years: No If all of the above answers are "NO", then may proceed with Cephalosporin use.      Review of Systems  Constitutional: Negative.   HENT:       Right eye lid dropping  Respiratory: Negative.   Cardiovascular: Negative.  Negative for chest pain, palpitations and leg swelling.  Gastrointestinal: Positive for constipation (she had pain medications at the ER, no BM since Saturday).  Neurological: Positive for headaches (intermittent- improving since going to the ER). Negative for dizziness.  Psychiatric/Behavioral: Negative.      Today's Vitals   09/14/20 1019  BP: 134/80  Pulse: 84  Temp: 98.4 F (36.9 C)  TempSrc: Oral  Weight: 299 lb 3.2 oz (135.7 kg)  Height: 5' 1.2" (1.554 m)  PainSc: 0-No pain   Body mass index is 56.16 kg/m.   Objective:  Physical Exam Vitals reviewed.  Constitutional:      General: She is not in acute distress.    Appearance: Normal appearance. She is obese.  Eyes:     Extraocular Movements:     Right eye: Abnormal extraocular motion (right upper lid minimal movement) present.     Left eye: Normal extraocular motion.  Cardiovascular:     Rate and Rhythm: Normal rate and regular rhythm.     Pulses: Normal pulses.     Heart sounds: Normal heart sounds. No murmur heard.   Pulmonary:     Effort: Pulmonary effort is normal. No respiratory distress.     Breath sounds: Normal breath sounds. No  wheezing.  Musculoskeletal:     Comments: Right lower extremity has a drift noted   Skin:    General: Skin is warm.     Comments: Nailbed of thumbs with discharge present. Also has  tenderness noted and hyperpigmented skin to cuticle  Neurological:     General: No focal deficit present.     Mental Status: She is alert and oriented to person, place, and time.     Cranial Nerves: No cranial nerve deficit.     Motor: No weakness.  Psychiatric:        Mood and Affect: Mood normal.        Behavior: Behavior normal.        Thought Content: Thought content normal.        Judgment: Judgment normal.         Assessment And Plan:     1. Drooping eyelid, right  Continues to have drooping to right upper eyelid, negative oculomotor with raising eyebrows.   Will check MRI of brain to make sure not a stroke - MR Brain W Wo Contrast; Future  2. Other headache syndrome  May be related to a complicated migraine  She is having an improvement and is taking maxalt as needed with good relief  3. Weakness of right lower extremity  She has a drift to right lower extremity and mild decreased strength of 4/5 with flexion/extension - MR Brain W Wo Contrast; Future  4. Other constipation  She is encouraged to take miralax daily to see if this helps her to have a bowel movement  Stay active and good water intake.   Patient was given opportunity to ask questions. Patient verbalized understanding of the plan and was able to repeat key elements of the plan. All questions were answered to their satisfaction.  Ashley Brine, FNP   I, Ashley Brine, FNP, have reviewed all documentation for this visit. The documentation on 09/14/20 for the exam, diagnosis, procedures, and orders are all accurate and complete.   IF YOU HAVE BEEN REFERRED TO A SPECIALIST, IT MAY TAKE 1-2 WEEKS TO SCHEDULE/PROCESS THE REFERRAL. IF YOU HAVE NOT HEARD FROM US/SPECIALIST IN TWO WEEKS, PLEASE GIVE [place] A CALL AT [phone].   THE PATIENT IS ENCOURAGED TO PRACTICE SOCIAL DISTANCING DUE TO THE COVID-19 PANDEMIC.

## 2020-09-21 ENCOUNTER — Ambulatory Visit (INDEPENDENT_AMBULATORY_CARE_PROVIDER_SITE_OTHER): Payer: Medicare Other | Admitting: Orthopaedic Surgery

## 2020-09-21 ENCOUNTER — Other Ambulatory Visit: Payer: Self-pay

## 2020-09-21 ENCOUNTER — Encounter: Payer: Self-pay | Admitting: Orthopaedic Surgery

## 2020-09-21 VITALS — BP 113/67 | HR 85 | Ht 62.0 in | Wt 299.0 lb

## 2020-09-21 DIAGNOSIS — M1711 Unilateral primary osteoarthritis, right knee: Secondary | ICD-10-CM

## 2020-09-21 NOTE — Progress Notes (Signed)
Office Visit Note   Patient: Ashley Lindsey           Date of Birth: Oct 20, 1960           MRN: [id number] Visit Date: 09/21/2020              Requested by: [name], [address] [place],  [place] [address] PCP: [name], FNP   Assessment & Plan: Visit Diagnoses:  1. Unilateral primary osteoarthritis, right knee     Plan: Right knee injection performed which she tolerated well.  She can follow-up with Dr. Durward Fortes as needed.  Follow-Up Instructions: No follow-ups on file.   Orders:  Orders Placed This Encounter  Procedures  . Large Joint Inj: R knee   No orders of the defined types were placed in this encounter.     Procedures: Large Joint Inj: R knee on 09/22/2020 2:40 PM Indications: pain and joint swelling Details: 22 G 1.5 in needle, anterolateral approach  Arthrogram: No  Medications: 40 mg methylPREDNISolone acetate 40 MG/ML; 0.5 mL lidocaine 1 %; 4 mL bupivacaine 0.25 % Outcome: tolerated well, no immediate complications Procedure, treatment alternatives, risks and benefits explained, specific risks discussed. Consent was given by the patient. Immediately prior to procedure a time out was called to verify the correct patient, procedure, equipment, support staff and site/side marked as required. Patient was prepped and draped in the usual sterile fashion.       Clinical Data: No additional findings.   Subjective: Chief Complaint  Patient presents with  . Right Knee - Pain    HPI 60 year old patient with significant recent increase in right knee pain with previous injection several months ago with good relief.  Patient's followed by Dr. Durward Fortes and he has been out of town and patient was scheduled to see me for a second right knee injection.  Good relief with first injection.  Patient does have a history of breast cancer with radiation treatment.  Patient's been working on weight loss and states she has lost 15 pounds.  She has  an upcoming appointment with Dr. Leafy Ro in the weight loss clinic.  Review of Systems all the systems updated unchanged.   Objective: Vital Signs: BP 113/67   Pulse 85   Ht 5\' 2"  (1.575 m)   Wt 299 lb (135.6 kg)   BMI 54.69 kg/m   Physical Exam Constitutional:      Appearance: She is well-developed.  HENT:     Head: Normocephalic.     Right Ear: External ear normal.     Left Ear: External ear normal.  Eyes:     Pupils: Pupils are equal, round, and reactive to light.  Neck:     Thyroid: No thyromegaly.     Trachea: No tracheal deviation.  Cardiovascular:     Rate and Rhythm: Normal rate.  Pulmonary:     Effort: Pulmonary effort is normal.  Abdominal:     Palpations: Abdomen is soft.  Skin:    General: Skin is warm and dry.  Neurological:     Mental Status: She is alert and oriented to person, place, and time.  Psychiatric:        Behavior: Behavior normal.     Ortho Exam morbid obesity crepitus with knee range of motion joint line tenderness.  Specialty Comments:  No specialty comments available.  Imaging: No results found.   PMFS History: Patient Active Problem List   Diagnosis Date Noted  . Bilateral primary osteoarthritis  of knee 02/09/2020  . Chronic intermittent hypoxia with obstructive sleep apnea 08/24/2019  . Chronic intractable headache 07/14/2019  . Morning headache 07/14/2019  . Loud snoring 07/14/2019  . Super obesity 07/14/2019  . Sleeps in sitting position due to orthopnea 07/14/2019  . Chest pain due to GERD 07/14/2019  . Nocturia more than twice per night 07/14/2019  . Unilateral primary osteoarthritis, right knee 05/26/2019  . Chronic migraine without aura without status migrainosus, not intractable 05/13/2019  . Class 3 severe obesity due to excess calories without serious comorbidity with body mass index (BMI) of 40.0 to 44.9 in adult ([place]) 08/05/2018  . Depression 08/05/2018  . Chronic nonintractable headache 08/05/2018  . Elevated  blood-pressure reading without diagnosis of hypertension 06/06/2018  . Chronic pain of right ankle 04/24/2018  . Breast cancer of lower-outer quadrant of right female breast ([place]) 12/02/2014  . Bronchospasm 09/21/2014  . Dyspnea 09/21/2014  . Chronic pain of right knee 09/21/2014  . Fibromyalgia 03/06/2013  . Dense breasts 03/06/2013  . Mastalgia 03/06/2013  . Back pain, lumbosacral 03/06/2013  . Lymphedema of arm 03/06/2013  . Atypical chest pain 01/06/2013  . Breast cancer of upper-inner quadrant of left female breast ([place]) 09/08/2012  . Family history of malignant neoplasm of breast 09/08/2012  . S/P radiation therapy   . History of breast cancer in female 08/13/2011   Past Medical History:  Diagnosis Date  . Abdominal cramps   . Arthritis   . Cancer [facility])    s/p radiation- last dose 6/12//has been off Tamoxifen 8/12  . Chronic headaches   . Costochondritis   . Fatigue   . Fibromyalgia 03/06/2013  . Lymphedema    RIGHT ARM-////  STATES USE LEFT ARM FOR BP'S  . Migraines   . Muscle spasms of head and/or neck    following to the breast  . Passed out    4th of july  . Personal history of radiation therapy 2010   lt breast   . S/P radiation therapy 08/19/07 - 10/10/07   Left Breast/5040 cGy/28 fractions with Boost for a toatl dose of 6300 dGy  . S/P radiation therapy 08/14/10 -10/03/10   right breast  . Sleep apnea    STOP BANG SCORE 5    Family History  Problem Relation Age of Onset  . Breast cancer Mother 38  . Heart disease Father   . Prostate cancer Maternal Uncle 78  . Cancer Maternal Aunt 63       d. from "female cancer" possibly cervical  . Breast cancer Maternal Aunt 60  . Prostate cancer Paternal Uncle   . Prostate cancer Maternal Grandfather 86  . Prostate cancer Maternal Uncle 80  . Prostate cancer Paternal Uncle   . Breast cancer Cousin 36       mat 1st cousin    Past Surgical History:  Procedure Laterality Date  . ABDOMINAL HYSTERECTOMY     with  left salpingooophorectomy  . BREAST LUMPECTOMY  2009/2012   LEFT/RIGHT with lymph node dissection  . KNEE ARTHROSCOPY     right  . OOPHORECTOMY  2013   Social History   Occupational History  . Occupation: disability  Tobacco Use  . Smoking status: Former Smoker    Packs/day: 0.25    Years: 1.00    Pack years: 0.25    Types: Cigarettes  . Smokeless tobacco: Never Used  Vaping Use  . Vaping Use: Never used  Substance and Sexual Activity  . Alcohol use: Not  Currently  . Drug use: No  . Sexual activity: Not Currently    Birth control/protection: Surgical

## 2020-09-22 ENCOUNTER — Encounter: Payer: Self-pay | Admitting: Adult Health

## 2020-09-22 ENCOUNTER — Ambulatory Visit (INDEPENDENT_AMBULATORY_CARE_PROVIDER_SITE_OTHER): Payer: Medicare Other | Admitting: Adult Health

## 2020-09-22 VITALS — BP 110/73 | HR 77 | Ht 62.0 in | Wt 298.0 lb

## 2020-09-22 DIAGNOSIS — M1711 Unilateral primary osteoarthritis, right knee: Secondary | ICD-10-CM | POA: Diagnosis not present

## 2020-09-22 DIAGNOSIS — G43709 Chronic migraine without aura, not intractable, without status migrainosus: Secondary | ICD-10-CM | POA: Diagnosis not present

## 2020-09-22 DIAGNOSIS — R519 Headache, unspecified: Secondary | ICD-10-CM

## 2020-09-22 DIAGNOSIS — G4733 Obstructive sleep apnea (adult) (pediatric): Secondary | ICD-10-CM

## 2020-09-22 MED ORDER — LIDOCAINE HCL 1 % IJ SOLN
0.5000 mL | INTRAMUSCULAR | Status: AC | PRN
  Administered 2020-09-22: .5 mL

## 2020-09-22 MED ORDER — BUPIVACAINE HCL 0.25 % IJ SOLN
4.0000 mL | INTRAMUSCULAR | Status: AC | PRN
  Administered 2020-09-22: 4 mL via INTRA_ARTICULAR

## 2020-09-22 MED ORDER — METHYLPREDNISOLONE ACETATE 40 MG/ML IJ SUSP
40.0000 mg | INTRAMUSCULAR | Status: AC | PRN
  Administered 2020-09-22: 40 mg via INTRA_ARTICULAR

## 2020-09-22 NOTE — Progress Notes (Addendum)
PATIENT: Ashley Lindsey DOB: [birth year]  REASON FOR VISIT: follow up HISTORY FROM: patient  HISTORY OF PRESENT ILLNESS: Today 09/22/20:  Ashley Lindsey is a 60 year old female with a history of obstructive sleep apnea and migraine headaches.  She returns today for follow-up.  She has not started on CPAP therapy reports that she has been nervous about wearing a mask at night.  She states this past Sunday she woke up with a severe headache.  She ended up going to church but passed out and was taken to the emergency room.  She is not reporting a right-sided symptoms that she thought was consistent with a stroke.  She was diagnosed with complicated migraine.  CT scan of the head  was normal.  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  The patient currently just uses Excedrin Migraine if she gets a headache.  Patient reports that she wakes up almost daily with a headache.  Some mornings are more severe than others.  HISTORY (Copied from Dr.Dohmeier's note) Ashley Yeaman Pickardis a 60 y.o. year old African American female patientseen here as a referralon 05/25/2019 referred by Dr. Sarina Ill, MD.Chiefconcernaccording to patient :  Ashley Lindsey has suffered from sleep related headaches, often woke up with headaches, the headaches have been chronic intractable.  She reports to them as migraines also Dr. Jaynee Eagles seem to qualify them as unspecified headache type.  There is a possibility that they are related to medication overuse.  There is also possibility that they are related to at this time undetected could-untreated sleep apnea.  Previous sleep test 2013 did not reveal sleep apnea, was performed at [facility].  The patient was diagnosed with breast cancer in the year 2013 and again 2015 first left and right breast affected, this interval history is very important.  I have the pleasure of seeing Ashley Lindsey today,a right-handed [place] or [place] American female with a possible sleep disorder. She has a  has a past medical history of Abdominal cramps, Arthritis, Breast Cancer ([place]), Chronic headaches, Costochondritis, Fatigue,Lymphedema, Migraines, Muscle spasms of head and/or neck, Passed out, Personal history of radiation therapy (2010), S/P radiation therapy (08/19/07 - 10/10/07), S/P radiation therapy (08/14/10 -10/03/10), and rebound migraine headaches.   The patient's previous sleep study is available here in copy.  She underwent a baseline polysomnography on 22 November 2011 and she was diagnosed with obstructive sleep apnea.  So on the contrary she actually had an AHI of 16.1 and RDI of 18.7/h significant for mild sleep apnea there were a lot of spontaneous arousals at night.  Two thirds of her events were hypopneas and only one third in apneas all of them were obstructive in nature the longest hypopnea duration was 53.8 seconds.  The apnea was strictly bound to REM sleep.  REM dependent apnea would usually require CPAP titration for which she returned to the [place] left on 10 June 2012 still has a significant number of spontaneous arousals 47 for the night but her AHI has been reduced to 0.0 she was tachypneic her overnight respiratory rate was 79 minutes she was titrated to only 5 cm water pressure under which her sleep efficiency was very much reduced and then to 6 cm water pressure where she also reached a low sleep efficiency but this time 77%.  Total sleep time was 230 minutes  of which over 70 minutes were in rem sleep.  She did not have oxygen desaturation and she did not have apneas or hypopneas.  Dr. Baird Lyons was the treating physician.  So she was ordered to use a small ResMed medium Mirage fullface mask and optimal CPAP pressure was determined to be 6 cm water.  Room air. She was "never given a CPAP ".   In the meantime the patient continues to wake up fatigued she  has loud and continuous snoring, she has a lot of body aching and pain sometimes shortness of breath trouble concentrating hypersomnia and daytime indigestion and acid reflux, continued weight gain, continued headaches especially in the morning she wakes up with headaches she also reports night sleep sweats sleeps better when she is slightly propped up, she takes almost daily naps and she has a dry mouth.  In addition chest pain when exercising, leg pain when exercising and she has swollen ankles and lower extremities.  Familymedical /sleep history:NO  other family member on CPAP with OSA, insomnia, sleep walkers.  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 Family status is single , with 1 adult daughter. Pets : a [place] terrier is present. Tobacco use; quit 7 years ago 2014. ETOH use : quit 7 years ago, 2014 ,  Caffeine intake in form of Coffee( 1 cup daily) Soda( none ) Tea (none) and neither  energy drinks. Regular exercise: none. Knee pain , had injured her knee in a fall.    Sleep habits are as follows: The patient's dinner time is between 8 PM. The patient goes to bed at 10-11 PM and falls asleep easily, continues to sleep for 1-2 hours, wakes for 5-6  bathroom breaks, the first time at 12 AM.   The preferred sleep position is laterally, with the support of 3-4  Pillows, helping Orthopnea, GERD- Dreams are reportedly frequent/vivid. She dreams more since gabapentin. Marland Kitchen  8 AM is the usual rise time.  The patient wakes up spontaneously 7-8 . She reports not feeling refreshed or restored in AM, with symptoms such as dry mouth, morning headaches, and residual fatigue.  Naps are taken frequently, lasting from 1-2 hours and are more refreshing than nocturnal sleep.    REVIEW OF SYSTEMS: Out of a complete 14 system review of symptoms, the patient complains only of the following symptoms, and all other  reviewed systems are negative.  FSS 52 ESS 14  ALLERGIES: Allergies  Allergen Reactions  . Aspirin     unknown  . Penicillins Hives and Swelling    Has patient had a PCN reaction causing immediate rash, facial/tongue/throat swelling, SOB or lightheadedness with hypotension: Yes Has patient had a PCN reaction causing severe rash involving mucus membranes or skin necrosis: Yes Has patient had a PCN reaction that required hospitalization Yes Has patient had a PCN reaction occurring within the last 10 years: No If all of the above answers are "NO", then may proceed with Cephalosporin use.     HOME MEDICATIONS: Outpatient Medications Prior to Visit  Medication Sig Dispense Refill  . acetaminophen (TYLENOL) 650 MG CR tablet Take 650 mg by mouth every 8 (eight) hours as needed for pain.    Marland Kitchen albuterol (VENTOLIN HFA) 108 (90 Base) MCG/ACT inhaler INHALE 1-2 PUFFS INTO THE LUNGS EVERY 6 (SIX) HOURS AS NEEDED FOR WHEEZING OR SHORTNESS OF BREATH. 6.7 each 1  . Bisacodyl (DULCOLAX PO)  Take 1 capsule by mouth daily as needed (constipation).     . cholecalciferol 5000 units TABS Take 1 tablet (5,000 Units total) by mouth 2 (two) times daily. (Patient taking differently: Take 5,000 Units by mouth daily.)    . cyclobenzaprine (FLEXERIL) 10 MG tablet TAKE 1 TABLET BY MOUTH EVERY DAY AS NEEDED (Patient taking differently: Take 10 mg by mouth 3 (three) times daily as needed for muscle spasms.) 90 tablet 0  . DEXILANT 60 MG capsule TAKE 1 CAPSULE BY MOUTH EVERY DAY 90 capsule 1  . diphenhydrAMINE (BENADRYL) 25 MG tablet Take 1 tablet (25 mg total) by mouth every 6 (six) hours as needed for up to 20 doses (migraine). Take with reglan for migraine headache 20 tablet 0  . fish oil-omega-3 fatty acids 1000 MG capsule Take 1 g by mouth daily.    . Flaxseed, Linseed, (FLAXSEED OIL MAX STR PO) Take 1 capsule by mouth daily.     Marland Kitchen gabapentin (NEURONTIN) 300 MG capsule Take 1 capsule (300 mg total) by mouth 3  (three) times daily. Medication may be dispensed to patient today. If you have questions, please call Dr. Ranell Patrick at [phone]. 90 capsule 3  . hydrochlorothiazide (HYDRODIURIL) 25 MG tablet TAKE 1 TABLET BY MOUTH EVERY DAY 90 tablet 1  . Iron-FA-B Cmp-C-Biot-Probiotic (FUSION PLUS) CAPS Take 1 capsule by mouth daily. 30 capsule 3  . Magnesium 300 MG CAPS Take 1 capsule (300 mg total) by mouth daily. Take with evening meal 90 capsule 1  . meloxicam (MOBIC) 7.5 MG tablet TAKE 1 TABLET BY MOUTH EVERY DAY 90 tablet 0  . Multiple Vitamins-Minerals (MULTIVITAMIN & MINERAL PO) Take 1 tablet by mouth daily.    Marland Kitchen omeprazole (PRILOSEC) 20 MG capsule Take 20 mg by mouth daily.    . ondansetron (ZOFRAN) 4 MG tablet TAKE 1 TABLET BY MOUTH DAILY AS NEEDED FOR NAUSEA OR VOMITING 30 tablet 1  . propranolol (INDERAL) 20 MG tablet TAKE 1 TABLET BY MOUTH TWICE A DAY 180 tablet 0  . rizatriptan (MAXALT-MLT) 10 MG disintegrating tablet TAKE 1 TABLET BY MOUTH AS NEEDED FOR MIGRAINE. MAY REPEAT IN 2 HOURS IF NEEDED 9 tablet 0  . senna (SENOKOT) 8.6 MG tablet Take 1 tablet by mouth daily as needed for constipation.     . SYMBICORT 80-4.5 MCG/ACT inhaler INHALE 2 PUFFS INTO THE LUNGS IN THE MORNING AND AT BEDTIME. 30.6 each 1  . triamcinolone cream (KENALOG) 0.1 % Apply 1 application topically 2 (two) times daily. Apply for up to one week. (Patient taking differently: Apply 1 application topically 2 (two) times daily as needed (rash).) 30 g 0  . UNABLE TO FIND Rx: L8000-Post Surgical Bra (Quantity: 6) D7412- Silicone Breast Prosthesis (Quantity: 2) Dx: 174.9; Bilateral partial mastectomy Replacement due to hot flashes since previous items were too heavy and hot 1 each 0  . venlafaxine XR (EFFEXOR-XR) 150 MG 24 hr capsule TAKE 1 CAPSULE BY MOUTH NIGHTLY WITH 75 MG CAPSULE (TOTAL OF 225 MG NIGHTLY) 90 capsule 0  . venlafaxine XR (EFFEXOR-XR) 75 MG 24 hr capsule TAKE 1 CAPSULE BY MOUTH DAILY IN COMBINATION WITH XR 150 MG  (TO EQUAL 225 MG TOTAL DAILY). 90 capsule 0  . vitamin E 200 UNIT capsule Take 200 Units by mouth daily.    . benzonatate (TESSALON PERLES) 100 MG capsule Take 1 capsule (100 mg total) by mouth every 6 (six) hours as needed for cough. 30 capsule 1  . doxycycline (VIBRAMYCIN) 100 MG capsule  Take 1 capsule (100 mg total) by mouth 2 (two) times daily. (Patient not taking: No sig reported) 14 capsule 0  . Erenumab-aooe (AIMOVIG) 140 MG/ML SOAJ Inject 140 mg into the skin every 30 (thirty) days. (Patient not taking: No sig reported) 1 pen 11  . fluconazole (DIFLUCAN) 150 MG tablet Take 1 tablet by mouth today and then repeat in 5 days (Patient not taking: No sig reported) 2 tablet 0  . hydrochlorothiazide (HYDRODIURIL) 12.5 MG tablet TAKE 1 TABLET BY MOUTH EVERY DAY (Patient not taking: No sig reported) 90 tablet 0  . metoCLOPramide (REGLAN) 10 MG tablet Take 0.5 tablets (5 mg total) by mouth every 6 (six) hours as needed for up to 5 days for nausea. 10 tablet 0  . promethazine (PHENERGAN) 25 MG tablet Take 1 tablet (25 mg total) by mouth every 6 (six) hours as needed for nausea or vomiting. (Patient not taking: No sig reported) 30 tablet 0  . traMADol (ULTRAM) 50 MG tablet Take 1 tablet (50 mg total) by mouth 2 (two) times daily as needed. (Patient not taking: No sig reported) 30 tablet 0   No facility-administered medications prior to visit.    PAST MEDICAL HISTORY: Past Medical History:  Diagnosis Date  . Abdominal cramps   . Arthritis   . Cancer [facility])    s/p radiation- last dose 6/12//has been off Tamoxifen 8/12  . Chronic headaches   . Costochondritis   . Fatigue   . Fibromyalgia 03/06/2013  . Lymphedema    RIGHT ARM-////  STATES USE LEFT ARM FOR BP'S  . Migraines   . Muscle spasms of head and/or neck    following to the breast  . Passed out    4th of july  . Personal history of radiation therapy 2010   lt breast   . S/P radiation therapy 08/19/07 - 10/10/07   Left Breast/5040 cGy/28  fractions with Boost for a toatl dose of 6300 dGy  . S/P radiation therapy 08/14/10 -10/03/10   right breast  . Sleep apnea    STOP BANG SCORE 5    PAST SURGICAL HISTORY: Past Surgical History:  Procedure Laterality Date  . ABDOMINAL HYSTERECTOMY     with left salpingooophorectomy  . BREAST LUMPECTOMY  2009/2012   LEFT/RIGHT with lymph node dissection  . KNEE ARTHROSCOPY     right  . OOPHORECTOMY  2013    FAMILY HISTORY: Family History  Problem Relation Age of Onset  . Breast cancer Mother 36  . Heart disease Father   . Prostate cancer Maternal Uncle 78  . Cancer Maternal Aunt 63       d. from "female cancer" possibly cervical  . Breast cancer Maternal Aunt 60  . Prostate cancer Paternal Uncle   . Prostate cancer Maternal Grandfather 66  . Prostate cancer Maternal Uncle 80  . Prostate cancer Paternal Uncle   . Breast cancer Cousin 71       mat 1st cousin    SOCIAL HISTORY: Social History   Socioeconomic History  . Marital status: Divorced    Spouse name: Not on file  . Number of children: Not on file  . Years of education: Not on file  . Highest education level: Not on file  Occupational History  . Occupation: disability  Tobacco Use  . Smoking status: Former Smoker    Packs/day: 0.25    Years: 1.00    Pack years: 0.25    Types: Cigarettes  . Smokeless tobacco: Never Used  Vaping Use  . Vaping Use: Never used  Substance and Sexual Activity  . Alcohol use: Not Currently  . Drug use: No  . Sexual activity: Not Currently    Birth control/protection: Surgical  Other Topics Concern  . Not on file  Social History Narrative   Right handed    Coffee daily, sometimes Ginger ale    Social Determinants of Health   Financial Resource Strain: Medium Risk  . Difficulty of Paying Living Expenses: Somewhat hard  Food Insecurity: No Food Insecurity  . Worried About Charity fundraiser in the Last Year: Never true  . Ran Out of Food in the Last Year: Never true   Transportation Needs: No Transportation Needs  . Lack of Transportation (Medical): No  . Lack of Transportation (Non-Medical): No  Physical Activity: Inactive  . Days of Exercise per Week: 0 days  . Minutes of Exercise per Session: 0 min  Stress: No Stress Concern Present  . Feeling of Stress : Not at all  Social Connections: Not on file  Intimate Partner Violence: Not on file      PHYSICAL EXAM  Vitals:   09/22/20 1412  Weight: 298 lb (135.2 kg)  Height: 5\' 2"  (1.575 m)   Body mass index is 54.5 kg/m.  Generalized: Well developed, in no acute distress   Neurological examination  Mentation: Alert oriented to time, place, history taking. Follows all commands speech and language fluent Cranial nerve II-XII: Pupils were equal round reactive to light. Extraocular movements were full, visual field were full on confrontational test.  Ptosis noted on the right.  Facial sensation and strength were normal. Head turning and shoulder shrug  were normal and symmetric. Motor: The motor testing reveals 5 over 5 strength of all 4 extremities. Good symmetric motor tone is noted throughout.  Sensory: Sensory testing is intact to soft touch on all 4 extremities. No evidence of extinction is noted.  Coordination: Cerebellar testing reveals good finger-nose-finger and heel-to-shin bilaterally.  Gait and station: Gait is normal.  Reflexes: Deep tendon reflexes are symmetric and normal bilaterally.   DIAGNOSTIC DATA (LABS, IMAGING, TESTING) - I reviewed patient records, labs, notes, testing and imaging myself where available.  Lab Results  Component Value Date   WBC 10.2 09/11/2020   HGB 15.6 (H) 09/11/2020   HCT 46.0 09/11/2020   MCV 89.2 09/11/2020   PLT 368 09/11/2020      Component Value Date/Time   NA 139 09/11/2020 1353   NA 140 08/03/2020 1205   NA 139 01/07/2017 0920   K 3.7 09/11/2020 1353   K 4.1 01/07/2017 0920   CL 100 09/11/2020 1353   CL 105 12/26/2011 1538   CO2 22  09/11/2020 1349   CO2 25 01/07/2017 0920   GLUCOSE 107 (H) 09/11/2020 1353   GLUCOSE 111 01/07/2017 0920   GLUCOSE 103 (H) 12/26/2011 1538   BUN 6 (L) 09/11/2020 1353   BUN 14 08/03/2020 1205   BUN 8.7 01/07/2017 0920   CREATININE 0.60 09/11/2020 1353   CREATININE 0.75 03/03/2018 1445   CREATININE 0.8 01/07/2017 0920   CALCIUM 9.4 09/11/2020 1349   CALCIUM 8.9 01/07/2017 0920   PROT 8.1 09/11/2020 1349   PROT 6.9 02/03/2020 1243   PROT 7.0 01/07/2017 0920   ALBUMIN 4.0 09/11/2020 1349   ALBUMIN 4.3 02/03/2020 1243   ALBUMIN 3.5 01/07/2017 0920   AST 25 09/11/2020 1349   AST 26 03/03/2018 1445   AST 47 (H) 01/07/2017 0920  ALT 18 09/11/2020 1349   ALT 25 03/03/2018 1445   ALT 54 01/07/2017 0920   ALKPHOS 60 09/11/2020 1349   ALKPHOS 73 01/07/2017 0920   BILITOT 0.5 09/11/2020 1349   BILITOT 0.2 02/03/2020 1243   BILITOT <0.2 (L) 03/03/2018 1445   BILITOT 0.22 01/07/2017 0920   GFRNONAA >60 09/11/2020 1349   GFRNONAA >60 03/03/2018 1445   GFRAA 110 02/03/2020 1243   GFRAA >60 03/03/2018 1445   Lab Results  Component Value Date   CHOL 203 (H) 08/03/2020   HDL 62 08/03/2020   LDLCALC 98 08/03/2020   TRIG 259 (H) 08/03/2020   CHOLHDL 3.3 08/03/2020   No results found for: HGBA1C No results found for: VITAMINB12 No results found for: TSH    ASSESSMENT AND PLAN 60 y.o. year old female  has a past medical history of Abdominal cramps, Arthritis, Cancer ([place]), Chronic headaches, Costochondritis, Fatigue, Fibromyalgia (03/06/2013), Lymphedema, Migraines, Muscle spasms of head and/or neck, Passed out, Personal history of radiation therapy (2010), S/P radiation therapy (08/19/07 - 10/10/07), S/P radiation therapy (08/14/10 -10/03/10), and Sleep apnea. here with:  1.  Obstructive sleep apnea 2.  Migraine headaches  Patient was advised that her morning headaches most likely are due to sleep apnea.  She was encouraged to follow through with getting CPAP machine.  She states that  she is willing to do this.  She will have an MRI of the brain that is been ordered by her PCP due to residual ptosis on the right after a "complicated migraine" on Sunday.  Patient was advised to let me know when the MRI is completed so we can also review these results.  Patient will follow up when she is started on CPAP machine.     Ward Givens, MSN, NP-C 09/22/2020, 2:20 PM [facility] [address], [place], [place] [address] [phone]  agree with assessment and plan as stated.     Sarina Ill, MD [facility]

## 2020-09-22 NOTE — Patient Instructions (Addendum)
Your Plan:  Start CPAP therapy Let me know results of MRI If your symptoms worsen or you develop new symptoms please let us know.   Thank you for coming to see [place] at [facility]. I hope we have been able to provide you high quality care today.  You may receive a patient satisfaction survey over the next few weeks. We would appreciate your feedback and comments so that we may continue to improve ourselves and the health of our patients.

## 2020-09-27 ENCOUNTER — Other Ambulatory Visit: Payer: Self-pay | Admitting: Nurse Practitioner

## 2020-09-27 DIAGNOSIS — Z6841 Body Mass Index (BMI) 40.0 and over, adult: Secondary | ICD-10-CM

## 2020-09-27 DIAGNOSIS — G8929 Other chronic pain: Secondary | ICD-10-CM

## 2020-09-27 DIAGNOSIS — R519 Headache, unspecified: Secondary | ICD-10-CM

## 2020-09-27 DIAGNOSIS — F32A Depression, unspecified: Secondary | ICD-10-CM

## 2020-09-28 ENCOUNTER — Ambulatory Visit: Payer: Self-pay

## 2020-09-28 ENCOUNTER — Encounter: Payer: Self-pay | Admitting: Orthopaedic Surgery

## 2020-09-28 ENCOUNTER — Other Ambulatory Visit: Payer: Self-pay

## 2020-09-28 ENCOUNTER — Ambulatory Visit (INDEPENDENT_AMBULATORY_CARE_PROVIDER_SITE_OTHER): Payer: Medicare Other | Admitting: Orthopaedic Surgery

## 2020-09-28 VITALS — Ht 62.0 in | Wt 292.0 lb

## 2020-09-28 DIAGNOSIS — M545 Low back pain, unspecified: Secondary | ICD-10-CM | POA: Diagnosis not present

## 2020-09-28 NOTE — Progress Notes (Signed)
Office Visit Note   Patient: Ashley Lindsey           Date of Birth: Aug 25, 1960           MRN: [id number] Visit Date: 09/28/2020              Requested by: [name], [address],  [place] [address] PCP: [name], FNP   Assessment & Plan: Visit Diagnoses:  1. Acute midline low back pain, unspecified whether sciatica present   2. Back pain, lumbosacral     Plan: ACute onset of low back pain after she passed out at church about a week and a half ago.  She was evaluated at the hospital did not have a stroke.  Films today were nondiagnostic for any acute changes.  There are very similar to her prior films with evidence of osteoarthritis.  Clinically she is neurologically intact.  I think she simply had a soft tissue exacerbation of her chronic back pain possibly related with aggravation of her arthritis.  She does have history of fibromyalgia and is on the appropriate medicines.  She is not using any ambulatory aid.  I think she is fine she does go to pool therapy several times a week which she should continue.  We will plan to see her back as needed but I think she will return to work baseline very quickly  Follow-Up Instructions: Return if symptoms worsen or fail to improve.   Orders:  Orders Placed This Encounter  Procedures  . XR Lumbar Spine 2-3 Views   No orders of the defined types were placed in this encounter.     Procedures: No procedures performed   Clinical Data: No additional findings.   Subjective: Chief Complaint  Patient presents with  . Lower Back - Pain  Patient presents with midline low back pain after she passed out and fell on 09/18/2020.  She states that she passed out at church and woke up and was in the stroke lab. She laid on a stretcher there and has had midline low back pain since then. She notices increased pain when up and active. She has pain with extended standing and is looking to invest in shower chair because  that is even difficult. She is currently taking flexeril, tylenol arthritis, and using a heating pad and sleeping in a reliner. She has a history of fibromyalgia. She states that that she is currently working on weight loss and goes to the pool.  Not experiencing any numbness or tingling to either lower extremity.  She does have an exacerbation of her back pain.  No flank discomfort.  She has been evaluated in the past with evidence of arthritis of the lumbar spine  HPI  Review of Systems   Objective: Vital Signs: Ht 5\' 2"  (1.575 m)   Wt 292 lb (132.5 kg)   BMI 53.41 kg/m   Physical Exam Constitutional:      Appearance: She is well-developed.  Eyes:     Pupils: Pupils are equal, round, and reactive to light.  Pulmonary:     Effort: Pulmonary effort is normal.  Skin:    General: Skin is warm and dry.  Neurological:     Mental Status: She is alert and oriented to person, place, and time.  Psychiatric:        Behavior: Behavior normal.     Ortho Exam BMI of 53.  Comfortable sitting.  Very slow gait but no use of ambulatory  aid.  Mild percussible tenderness lumbar spine.  Straight leg raise negative.  Motor exam intact.  No flank pain  Specialty Comments:  No specialty comments available.  Imaging: XR Lumbar Spine 2-3 Views  Result Date: 09/28/2020 Films lumbar spine obtained in 2 projections and compared to prior films.  There is no change.  No evidence of a compression fracture.  Very minimal anterior listhesis of L5 on S1 and facet sclerosis at L4-5 and L5-S1.  Very minimal scoliosis to the right    PMFS History: Patient Active Problem List   Diagnosis Date Noted  . Bilateral primary osteoarthritis of knee 02/09/2020  . Chronic intermittent hypoxia with obstructive sleep apnea 08/24/2019  . Chronic intractable headache 07/14/2019  . Morning headache 07/14/2019  . Loud snoring 07/14/2019  . Super obesity 07/14/2019  . Sleeps in sitting position due to orthopnea  07/14/2019  . Chest pain due to GERD 07/14/2019  . Nocturia more than twice per night 07/14/2019  . Unilateral primary osteoarthritis, right knee 05/26/2019  . Chronic migraine without aura without status migrainosus, not intractable 05/13/2019  . Class 3 severe obesity due to excess calories without serious comorbidity with body mass index (BMI) of 40.0 to 44.9 in adult ([place]) 08/05/2018  . Depression 08/05/2018  . Chronic nonintractable headache 08/05/2018  . Elevated blood-pressure reading without diagnosis of hypertension 06/06/2018  . Chronic pain of right ankle 04/24/2018  . Breast cancer of lower-outer quadrant of right female breast ([place]) 12/02/2014  . Bronchospasm 09/21/2014  . Dyspnea 09/21/2014  . Chronic pain of right knee 09/21/2014  . Fibromyalgia 03/06/2013  . Dense breasts 03/06/2013  . Mastalgia 03/06/2013  . Back pain, lumbosacral 03/06/2013  . Lymphedema of arm 03/06/2013  . Atypical chest pain 01/06/2013  . Breast cancer of upper-inner quadrant of left female breast ([place]) 09/08/2012  . Family history of malignant neoplasm of breast 09/08/2012  . S/P radiation therapy   . History of breast cancer in female 08/13/2011   Past Medical History:  Diagnosis Date  . Abdominal cramps   . Arthritis   . Cancer [facility])    s/p radiation- last dose 6/12//has been off Tamoxifen 8/12  . Chronic headaches   . Costochondritis   . Fatigue   . Fibromyalgia 03/06/2013  . Lymphedema    RIGHT ARM-////  STATES USE LEFT ARM FOR BP'S  . Migraines   . Muscle spasms of head and/or neck    following to the breast  . Passed out    4th of july  . Personal history of radiation therapy 2010   lt breast   . S/P radiation therapy 08/19/07 - 10/10/07   Left Breast/5040 cGy/28 fractions with Boost for a toatl dose of 6300 dGy  . S/P radiation therapy 08/14/10 -10/03/10   right breast  . Sleep apnea    STOP BANG SCORE 5    Family History  Problem Relation Age of Onset  . Breast cancer  Mother 38  . Heart disease Father   . Prostate cancer Maternal Uncle 78  . Cancer Maternal Aunt 63       d. from "female cancer" possibly cervical  . Breast cancer Maternal Aunt 60  . Prostate cancer Paternal Uncle   . Prostate cancer Maternal Grandfather 32  . Prostate cancer Maternal Uncle 80  . Prostate cancer Paternal Uncle   . Breast cancer Cousin 57       mat 1st cousin    Past Surgical History:  Procedure Laterality Date  .  ABDOMINAL HYSTERECTOMY     with left salpingooophorectomy  . BREAST LUMPECTOMY  2009/2012   LEFT/RIGHT with lymph node dissection  . KNEE ARTHROSCOPY     right  . OOPHORECTOMY  2013   Social History   Occupational History  . Occupation: disability  Tobacco Use  . Smoking status: Former Smoker    Packs/day: 0.25    Years: 1.00    Pack years: 0.25    Types: Cigarettes  . Smokeless tobacco: Never Used  Vaping Use  . Vaping Use: Never used  Substance and Sexual Activity  . Alcohol use: Not Currently  . Drug use: No  . Sexual activity: Not Currently    Birth control/protection: Surgical

## 2020-10-04 ENCOUNTER — Ambulatory Visit
Admission: RE | Admit: 2020-10-04 | Discharge: 2020-10-04 | Disposition: A | Discharge status: Home / Self Care | Payer: Medicare Other | Source: Ambulatory Visit | Attending: Nurse Practitioner | Admitting: Nurse Practitioner

## 2020-10-04 DIAGNOSIS — G459 Transient cerebral ischemic attack, unspecified: Secondary | ICD-10-CM | POA: Diagnosis not present

## 2020-10-04 DIAGNOSIS — H02401 Unspecified ptosis of right eyelid: Secondary | ICD-10-CM

## 2020-10-04 DIAGNOSIS — R29898 Other symptoms and signs involving the musculoskeletal system: Secondary | ICD-10-CM

## 2020-10-04 MED ORDER — GADOBENATE DIMEGLUMINE 529 MG/ML IV SOLN
20.0000 mL | Freq: Once | INTRAVENOUS | Status: AC | PRN
  Administered 2020-10-04: 20 mL via INTRAVENOUS

## 2020-10-04 NOTE — Assessment & Plan Note (Signed)
Left breast invasive ductal carcinoma grade 1 ER 77% PR 96% gases on 5% HER-2 negative, T1 AN0M0 stage I A. status post lumpectomy 2.7 2009 followed by radiation therapy completed 10/10/2007, tamoxifen started September 2009 discontinued March 2012 followed by recurrence in the right breast as DCIS 05/26/2010 completed another lumpectomy and radiation therapy and she has been on tamoxifen ever since.  Insufficient tissue for BCI Completed Tamoxifen July 2017  Breast Cancer Surveillance: 1. Breast exam1/19/2021: Benign, fibromyalgia causing breast tenderness.   2. Mammogram2/12/2020 benign breast density category C  Encouraged her to lose weight with intermittent fasting.  Severe fibromyalgia related pains: Sent for gabapentin 300 TID, going to see neurology  Return to clinic in 1 year for follow-up

## 2020-10-04 NOTE — Progress Notes (Signed)
Patient Care Team: [name], FNP as PCP - General (General Practice)  DIAGNOSIS:    ICD-10-CM   1. Malignant neoplasm of lower-outer quadrant of right female breast, unspecified estrogen receptor status ([place])  C50.511     2. Malignant neoplasm of upper-inner quadrant of left breast in female, estrogen receptor positive ([place])  C50.212    Z17.0       SUMMARY OF ONCOLOGIC HISTORY: Oncology History  Breast cancer of upper-inner quadrant of left female breast ([place])  05/27/2007 Initial Diagnosis   Left breast biopsy: DCIS with calcifications ER/PR 100% positive, MRI revealed 1.7 x 1.4 x 0.9 cm DCIS at 12:00 position left breast    06/20/2007 Surgery   Left breast lumpectomy: T1 N0 M0 stage IA invasive ductal carcinoma 0.4-0.5 cm grade 1, ER 77%, PR 96%, Ki-67 less than 5%, HER-2 negative    08/21/2007 - 10/10/2007 Radiation Therapy   Adjuvant radiation therapy    01/14/2008 - 01/07/2017 Anti-estrogen oral therapy   Tamoxifen 20 mg daily   Breast cancer of lower-outer quadrant of right female breast ([place])  05/26/2010 Initial Diagnosis   DCIS ER/PR positive    06/16/2010 Surgery   No residual DCIS, extensive fibrocystic changes with focal atypical ductal hyperplasia    08/02/2010 - 09/22/2010 Radiation Therapy   Adjuvant radiation    10/26/2010 - 10/31/2015 Anti-estrogen oral therapy   Tamoxifen 20 mg daily      CHIEF COMPLIANT: Follow-up of bilateral DCIS  INTERVAL HISTORY: Ashley Lindsey is a 60 y.o. with above-mentioned history of bilateral DCIS who underwent a lumpectomy, radiation, and completed 5 years of tamoxifen. Mammogram on 06/01/2020 shows no evidence of breast malignancy. She reports to the clinic today for follow-up.  Patient has soreness in bilateral breasts.  Recently she has been swimming a lot and it may be related to the exertion related to the swimming.  She was recently at church and had a syncopal episode and she was brought into the emergency room where  stroke protocol was called but there was no evidence of stroke.  MRI of the brain done yesterday did not show any evidence of stroke either.  ALLERGIES:  is allergic to aspirin and penicillins.  MEDICATIONS:  Current Outpatient Medications  Medication Sig Dispense Refill   acetaminophen (TYLENOL) 650 MG CR tablet Take 650 mg by mouth every 8 (eight) hours as needed for pain.     albuterol (VENTOLIN HFA) 108 (90 Base) MCG/ACT inhaler INHALE 1-2 PUFFS INTO THE LUNGS EVERY 6 (SIX) HOURS AS NEEDED FOR WHEEZING OR SHORTNESS OF BREATH. 6.7 each 1   Bisacodyl (DULCOLAX PO) Take 1 capsule by mouth daily as needed (constipation).      cholecalciferol 5000 units TABS Take 1 tablet (5,000 Units total) by mouth 2 (two) times daily. (Patient taking differently: Take 5,000 Units by mouth daily.)     cyclobenzaprine (FLEXERIL) 10 MG tablet TAKE 1 TABLET BY MOUTH EVERY DAY AS NEEDED (Patient taking differently: Take 10 mg by mouth 3 (three) times daily as needed for muscle spasms.) 90 tablet 0   DEXILANT 60 MG capsule TAKE 1 CAPSULE BY MOUTH EVERY DAY 90 capsule 1   diphenhydrAMINE (BENADRYL) 25 MG tablet Take 1 tablet (25 mg total) by mouth every 6 (six) hours as needed for up to 20 doses (migraine). Take with reglan for migraine headache 20 tablet 0   fish oil-omega-3 fatty acids 1000 MG capsule Take 1 g by mouth daily.     Flaxseed, Linseed, (FLAXSEED  OIL MAX STR PO) Take 1 capsule by mouth daily.      gabapentin (NEURONTIN) 300 MG capsule Take 1 capsule (300 mg total) by mouth 3 (three) times daily. Medication may be dispensed to patient today. If you have questions, please call Dr. Ranell Patrick at [phone]. 90 capsule 3   hydrochlorothiazide (HYDRODIURIL) 25 MG tablet TAKE 1 TABLET BY MOUTH EVERY DAY 90 tablet 1   Iron-FA-B Cmp-C-Biot-Probiotic (FUSION PLUS) CAPS Take 1 capsule by mouth daily. 30 capsule 3   Magnesium 300 MG CAPS Take 1 capsule (300 mg total) by mouth daily. Take with evening meal 90 capsule  1   meloxicam (MOBIC) 7.5 MG tablet TAKE 1 TABLET BY MOUTH EVERY DAY 90 tablet 0   Multiple Vitamins-Minerals (MULTIVITAMIN & MINERAL PO) Take 1 tablet by mouth daily.     omeprazole (PRILOSEC) 20 MG capsule Take 20 mg by mouth daily.     ondansetron (ZOFRAN) 4 MG tablet TAKE 1 TABLET BY MOUTH DAILY AS NEEDED FOR NAUSEA OR VOMITING 30 tablet 1   propranolol (INDERAL) 20 MG tablet TAKE 1 TABLET BY MOUTH TWICE A DAY 180 tablet 0   rizatriptan (MAXALT-MLT) 10 MG disintegrating tablet TAKE 1 TABLET BY MOUTH AS NEEDED FOR MIGRAINE. MAY REPEAT IN 2 HOURS IF NEEDED 9 tablet 0   senna (SENOKOT) 8.6 MG tablet Take 1 tablet by mouth daily as needed for constipation.      SYMBICORT 80-4.5 MCG/ACT inhaler INHALE 2 PUFFS INTO THE LUNGS IN THE MORNING AND AT BEDTIME. 30.6 each 1   triamcinolone cream (KENALOG) 0.1 % Apply 1 application topically 2 (two) times daily. Apply for up to one week. (Patient taking differently: Apply 1 application topically 2 (two) times daily as needed (rash).) 30 g 0   UNABLE TO FIND Rx: L8000-Post Surgical Bra (Quantity: 6) B2010- Silicone Breast Prosthesis (Quantity: 2) Dx: 174.9; Bilateral partial mastectomy Replacement due to hot flashes since previous items were too heavy and hot 1 each 0   venlafaxine XR (EFFEXOR-XR) 150 MG 24 hr capsule TAKE 1 CAPSULE BY MOUTH NIGHTLY WITH 75 MG CAPSULE (TOTAL OF 225 MG NIGHTLY) 90 capsule 0   venlafaxine XR (EFFEXOR-XR) 75 MG 24 hr capsule TAKE 1 CAPSULE BY MOUTH DAILY IN COMBINATION WITH XR 150 MG (TO EQUAL 225 MG TOTAL DAILY). 90 capsule 0   vitamin E 200 UNIT capsule Take 200 Units by mouth daily.     No current facility-administered medications for this visit.    PHYSICAL EXAMINATION: ECOG PERFORMANCE STATUS: 1 - Symptomatic but completely ambulatory  Vitals:   10/05/20 1204  BP: (!) 118/42  Pulse: 81  Resp: 18  Temp: (!) 97.3 F (36.3 C)  SpO2: 96%   Filed Weights   10/05/20 1204  Weight: 296 lb 1.6 oz (134.3 kg)     BREAST: No palpable masses or nodules in either right or left breasts. No palpable axillary supraclavicular or infraclavicular adenopathy no breast tenderness or nipple discharge. (exam performed in the presence of a chaperone)  LABORATORY DATA:  I have reviewed the data as listed CMP Latest Ref Rng & Units 09/11/2020 09/11/2020 08/03/2020  Glucose 70 - 99 mg/dL 107(H) 102(H) 105(H)  BUN 8 - 23 mg/dL 6(L) 7(L) 14  Creatinine 0.44 - 1.00 mg/dL 0.60 0.70 0.66  Sodium 135 - 145 mmol/L 139 136 140  Potassium 3.5 - 5.1 mmol/L 3.7 3.9 4.6  Chloride 98 - 111 mmol/L 100 99 100  CO2 22 - 32 mmol/L - 22 26  Calcium 8.9 - 10.3 mg/dL - 9.4 9.1  Total Protein 6.5 - 8.1 g/dL - 8.1 -  Total Bilirubin 0.3 - 1.2 mg/dL - 0.5 -  Alkaline Phos 38 - 126 U/L - 60 -  AST 15 - 41 U/L - 25 -  ALT 0 - 44 U/L - 18 -    Lab Results  Component Value Date   WBC 9.1 10/05/2020   HGB 11.7 (L) 10/05/2020   HCT 38.6 10/05/2020   MCV 86.5 10/05/2020   PLT 258 10/05/2020   NEUTROABS 6.1 10/05/2020    ASSESSMENT & PLAN:  Breast cancer of upper-inner quadrant of left female breast ([place]) Left breast invasive ductal carcinoma grade 1 ER 77% PR 96% gases on 5% HER-2 negative, T1 AN0M0 stage I A. status post lumpectomy 2.7 2009 followed by radiation therapy completed 10/10/2007, tamoxifen started September 2009 discontinued March 2012 followed by recurrence in the right breast as DCIS 05/26/2010 completed another lumpectomy and radiation therapy and she has been on tamoxifen ever since.   Insufficient tissue for BCI Completed Tamoxifen July 2017   Breast Cancer Surveillance: 1. Breast exam 05/12/2019: Benign, fibromyalgia causing breast tenderness.  Also tenderness because of exercises and swimming 2. Mammogram 06/01/2020 benign breast density category C   Recent syncope: Brain MRI no evidence of stroke.  Encouraged her to lose weight with intermittent fasting.   Severe fibromyalgia related pains: Sent for  gabapentin 300 TID, going to see neurology   Return to clinic in 1 year for follow-up    No orders of the defined types were placed in this encounter.  The patient has a good understanding of the overall plan. she agrees with it. she will call with any problems that may develop before the next visit here.  Total time spent: 30 mins including face to face time and time spent for planning, charting and coordination of care  Rulon Eisenmenger, MD, MPH 10/05/2020  I, Thana Ates, am acting as scribe for Dr. Nicholas Lose.  I have reviewed the above documentation for accuracy and completeness, and I agree with the above.

## 2020-10-05 ENCOUNTER — Inpatient Hospital Stay: Payer: Medicare Other | Attending: Hematology and Oncology | Admitting: Hematology and Oncology

## 2020-10-05 ENCOUNTER — Other Ambulatory Visit: Payer: Self-pay

## 2020-10-05 ENCOUNTER — Inpatient Hospital Stay: Payer: Medicare Other

## 2020-10-05 VITALS — BP 118/42 | HR 81 | Temp 97.3°F | Resp 18 | Ht 62.0 in | Wt 296.1 lb

## 2020-10-05 DIAGNOSIS — C50212 Malignant neoplasm of upper-inner quadrant of left female breast: Secondary | ICD-10-CM | POA: Diagnosis not present

## 2020-10-05 DIAGNOSIS — Z923 Personal history of irradiation: Secondary | ICD-10-CM | POA: Insufficient documentation

## 2020-10-05 DIAGNOSIS — Z170 Estrogen receptor positive status [ER+]: Secondary | ICD-10-CM

## 2020-10-05 DIAGNOSIS — M797 Fibromyalgia: Secondary | ICD-10-CM | POA: Diagnosis not present

## 2020-10-05 DIAGNOSIS — Z9221 Personal history of antineoplastic chemotherapy: Secondary | ICD-10-CM | POA: Insufficient documentation

## 2020-10-05 DIAGNOSIS — Z79811 Long term (current) use of aromatase inhibitors: Secondary | ICD-10-CM | POA: Insufficient documentation

## 2020-10-05 DIAGNOSIS — C50511 Malignant neoplasm of lower-outer quadrant of right female breast: Secondary | ICD-10-CM

## 2020-10-05 DIAGNOSIS — Z79899 Other long term (current) drug therapy: Secondary | ICD-10-CM | POA: Insufficient documentation

## 2020-10-05 LAB — CBC WITH DIFFERENTIAL (CANCER CENTER ONLY)
Abs Immature Granulocytes: 0.04 10*3/uL (ref 0.00–0.07)
Basophils Absolute: 0 10*3/uL (ref 0.0–0.1)
Basophils Relative: 0 %
Eosinophils Absolute: 0.2 10*3/uL (ref 0.0–0.5)
Eosinophils Relative: 2 %
HCT: 38.6 % (ref 36.0–46.0)
Hemoglobin: 11.7 g/dL — ABNORMAL LOW (ref 12.0–15.0)
Immature Granulocytes: 0 %
Lymphocytes Relative: 25 %
Lymphs Abs: 2.3 10*3/uL (ref 0.7–4.0)
MCH: 26.2 pg (ref 26.0–34.0)
MCHC: 30.3 g/dL (ref 30.0–36.0)
MCV: 86.5 fL (ref 80.0–100.0)
Monocytes Absolute: 0.5 10*3/uL (ref 0.1–1.0)
Monocytes Relative: 6 %
Neutro Abs: 6.1 10*3/uL (ref 1.7–7.7)
Neutrophils Relative %: 67 %
Platelet Count: 258 10*3/uL (ref 150–400)
RBC: 4.46 MIL/uL (ref 3.87–5.11)
RDW: 14.7 % (ref 11.5–15.5)
WBC Count: 9.1 10*3/uL (ref 4.0–10.5)
nRBC: 0 % (ref 0.0–0.2)

## 2020-10-05 LAB — CMP (CANCER CENTER ONLY)
ALT: 16 U/L (ref 0–44)
AST: 14 U/L — ABNORMAL LOW (ref 15–41)
Albumin: 3.4 g/dL — ABNORMAL LOW (ref 3.5–5.0)
Alkaline Phosphatase: 80 U/L (ref 38–126)
Anion gap: 8 (ref 5–15)
BUN: 10 mg/dL (ref 8–23)
CO2: 32 mmol/L (ref 22–32)
Calcium: 9.8 mg/dL (ref 8.9–10.3)
Chloride: 99 mmol/L (ref 98–111)
Creatinine: 0.72 mg/dL (ref 0.44–1.00)
GFR, Estimated: >60 mL/min (ref >60)
Glucose, Bld: 134 mg/dL — ABNORMAL HIGH (ref 70–99)
Potassium: 4.2 mmol/L (ref 3.5–5.1)
Sodium: 139 mmol/L (ref 135–145)
Total Bilirubin: <0.2 mg/dL — ABNORMAL LOW (ref 0.3–1.2)
Total Protein: 7.4 g/dL (ref 6.5–8.1)

## 2020-10-06 ENCOUNTER — Other Ambulatory Visit: Payer: Self-pay | Admitting: Nurse Practitioner

## 2020-10-06 ENCOUNTER — Telehealth: Payer: Self-pay | Admitting: Hematology and Oncology

## 2020-10-06 ENCOUNTER — Other Ambulatory Visit: Payer: Self-pay | Admitting: Physical Medicine and Rehabilitation

## 2020-10-06 DIAGNOSIS — M797 Fibromyalgia: Secondary | ICD-10-CM

## 2020-10-06 NOTE — Telephone Encounter (Signed)
Scheduled per 6/15 los. Called and spoke with pt, confirmed 6/15 appt

## 2020-10-11 ENCOUNTER — Other Ambulatory Visit: Payer: Self-pay | Admitting: Neurology

## 2020-10-12 ENCOUNTER — Encounter: Payer: Self-pay | Admitting: Adult Health

## 2020-10-19 ENCOUNTER — Other Ambulatory Visit: Payer: Self-pay | Admitting: Nurse Practitioner

## 2020-10-19 DIAGNOSIS — R55 Syncope and collapse: Secondary | ICD-10-CM

## 2020-10-20 ENCOUNTER — Ambulatory Visit (HOSPITAL_COMMUNITY)
Admission: EM | Admit: 2020-10-20 | Discharge: 2020-10-20 | Disposition: A | Discharge status: Home / Self Care | Payer: Medicare Other

## 2020-10-20 ENCOUNTER — Other Ambulatory Visit: Payer: Self-pay

## 2020-10-20 ENCOUNTER — Encounter (HOSPITAL_COMMUNITY): Payer: Self-pay

## 2020-10-20 DIAGNOSIS — H6982 Other specified disorders of Eustachian tube, left ear: Secondary | ICD-10-CM

## 2020-10-20 NOTE — ED Provider Notes (Signed)
[facility]    CSN: [id number] Arrival date & time: 10/20/20  8466      History   Chief Complaint Chief Complaint  Patient presents with   difficulty hearing     HPI Ashley Lindsey is a 60 y.o. female with past medical history of breast cancer, migraine headaches presents to urgent care today with complaints of decreased hearing, ringing in left ear that started this morning.  Patient states she awoke to go to the restroom around 0230 this morning and felt fine.  She often awakes during the night to use the restroom.  She awoke again at 430 to use the restroom and noticed decreased hearing, fullness in left ear and ringing in ear prompting her to come to urgent care this morning.  Patient describes migraine headache earlier this week relieved with her normal regimen of Benadryl.  Of note, she was seen in the ER on 5/22 for strokelike symptoms.  Evaluated by neurology at that time and symptoms felt to be more related to atypical migraine.  Patient with onset of right eyelid drooping from that episode that has somewhat improved but not quite resolved.  Patient denies any headache, she denies any dizziness but just states she feels a little "off balance" with fullness feeling in her ear.  No recent chest pain, SOB, weakness, palpitations.    Past Medical History:  Diagnosis Date   Abdominal cramps    Arthritis    Cancer ([place])    s/p radiation- last dose 6/12//has been off Tamoxifen 8/12   Chronic headaches    Costochondritis    Fatigue    Fibromyalgia 03/06/2013   Lymphedema    RIGHT ARM-////  STATES USE LEFT ARM FOR BP'S   Migraines    Muscle spasms of head and/or neck    following to the breast   Passed out    4th of july   Personal history of radiation therapy 2010   lt breast    S/P radiation therapy 08/19/07 - 10/10/07   Left Breast/5040 cGy/28 fractions with Boost for a toatl dose of 6300 dGy   S/P radiation therapy 08/14/10 -10/03/10   right breast   Sleep  apnea    STOP BANG SCORE 5    Patient Active Problem List   Diagnosis Date Noted   Bilateral primary osteoarthritis of knee 02/09/2020   Chronic intermittent hypoxia with obstructive sleep apnea 08/24/2019   Chronic intractable headache 07/14/2019   Morning headache 07/14/2019   Loud snoring 07/14/2019   Super obesity 07/14/2019   Sleeps in sitting position due to orthopnea 07/14/2019   Chest pain due to GERD 07/14/2019   Nocturia more than twice per night 07/14/2019   Unilateral primary osteoarthritis, right knee 05/26/2019   Chronic migraine without aura without status migrainosus, not intractable 05/13/2019   Class 3 severe obesity due to excess calories without serious comorbidity with body mass index (BMI) of 40.0 to 44.9 in adult ([place]) 08/05/2018   Depression 08/05/2018   Chronic nonintractable headache 08/05/2018   Elevated blood-pressure reading without diagnosis of hypertension 06/06/2018   Chronic pain of right ankle 04/24/2018   Breast cancer of lower-outer quadrant of right female breast ([place]) 12/02/2014   Bronchospasm 09/21/2014   Dyspnea 09/21/2014   Chronic pain of right knee 09/21/2014   Fibromyalgia 03/06/2013   Dense breasts 03/06/2013   Mastalgia 03/06/2013   Back pain, lumbosacral 03/06/2013   Lymphedema of arm 03/06/2013   Atypical chest pain 01/06/2013   Breast  cancer of upper-inner quadrant of left female breast ([place]) 09/08/2012   Family history of malignant neoplasm of breast 09/08/2012   S/P radiation therapy    History of breast cancer in female 08/13/2011    Past Surgical History:  Procedure Laterality Date   ABDOMINAL HYSTERECTOMY     with left salpingooophorectomy   BREAST LUMPECTOMY  2009/2012   LEFT/RIGHT with lymph node dissection   KNEE ARTHROSCOPY     right   OOPHORECTOMY  2013    OB History   No obstetric history on file.      Home Medications    Prior to Admission medications   Medication Sig Start Date End Date Taking?  Authorizing Provider  acetaminophen (TYLENOL) 650 MG CR tablet Take 650 mg by mouth every 8 (eight) hours as needed for pain.    [provider]  albuterol (VENTOLIN HFA) 108 (90 Base) MCG/ACT inhaler INHALE 1-2 PUFFS INTO THE LUNGS EVERY 6 (SIX) HOURS AS NEEDED FOR WHEEZING OR SHORTNESS OF BREATH. 05/03/20   Nicholas Lose, MD  Bisacodyl (DULCOLAX PO) Take 1 capsule by mouth daily as needed (constipation).     [provider]  cholecalciferol 5000 units TABS Take 1 tablet (5,000 Units total) by mouth 2 (two) times daily. Patient taking differently: Take 5,000 Units by mouth daily. 07/21/15   Nicholas Lose, MD  cyclobenzaprine (FLEXERIL) 10 MG tablet Take 1 tablet (10 mg total) by mouth 3 (three) times daily as needed for muscle spasms. 10/10/20   [name], FNP  DEXILANT 60 MG capsule TAKE 1 CAPSULE BY MOUTH EVERY DAY 08/30/20   [name], FNP  diphenhydrAMINE (BENADRYL) 25 MG tablet Take 1 tablet (25 mg total) by mouth every 6 (six) hours as needed for up to 20 doses (migraine). Take with reglan for migraine headache 09/11/20   Wyvonnia Dusky, MD  fish oil-omega-3 fatty acids 1000 MG capsule Take 1 g by mouth daily.    [provider]  Flaxseed, Linseed, (FLAXSEED OIL MAX STR PO) Take 1 capsule by mouth daily.     [provider]  gabapentin (NEURONTIN) 300 MG capsule TAKE 1 CAPSULE BY MOUTH THREE TIMES A DAY 10/06/20   Raulkar, Clide Deutscher, MD  hydrochlorothiazide (HYDRODIURIL) 25 MG tablet TAKE 1 TABLET BY MOUTH EVERY DAY 08/11/20   [name], FNP  Iron-FA-B Cmp-C-Biot-Probiotic (FUSION PLUS) CAPS Take 1 capsule by mouth daily. 06/23/18   [name], FNP  Magnesium 300 MG CAPS Take 1 capsule (300 mg total) by mouth daily. Take with evening meal 08/03/20   [name], FNP  meloxicam (MOBIC) 7.5 MG tablet TAKE 1 TABLET BY MOUTH EVERY DAY 08/19/20   [name], FNP  Multiple Vitamins-Minerals (MULTIVITAMIN & MINERAL PO) Take 1 tablet by mouth daily.     [provider]  omeprazole (PRILOSEC) 20 MG capsule Take 20 mg by mouth daily.    [provider]  ondansetron (ZOFRAN) 4 MG tablet TAKE 1 TABLET BY MOUTH DAILY AS NEEDED FOR NAUSEA OR VOMITING 08/19/20   [name], FNP  propranolol (INDERAL) 20 MG tablet TAKE 1 TABLET BY MOUTH TWICE A DAY 09/27/20   [name], FNP  rizatriptan (MAXALT-MLT) 10 MG disintegrating tablet TAKE 1 TABLET BY MOUTH AS NEEDED FOR MIGRAINE. MAY REPEAT IN 2 HOURS IF NEEDED 10/11/20   Ward Givens, NP  senna (SENOKOT) 8.6 MG tablet Take 1 tablet by mouth daily as needed for constipation.     [provider]  SYMBICORT 80-4.5 MCG/ACT inhaler  INHALE 2 PUFFS INTO THE LUNGS IN THE MORNING AND AT BEDTIME. 05/05/20   [name], FNP  triamcinolone cream (KENALOG) 0.1 % Apply 1 application topically 2 (two) times daily. Apply for up to one week. Patient taking differently: Apply 1 application topically 2 (two) times daily as needed (rash). 12/29/16   Vanessa Kick, MD  UNABLE TO FIND Rx: L8000-Post Surgical Bra (Quantity: 6) X5400- Silicone Breast Prosthesis (Quantity: 2) Dx: 174.9; Bilateral partial mastectomy Replacement due to hot flashes since previous items were too heavy and hot 01/20/13   Fanny Skates, MD  venlafaxine XR (EFFEXOR-XR) 150 MG 24 hr capsule TAKE 1 CAPSULE BY MOUTH NIGHTLY WITH 75 MG CAPSULE (TOTAL OF 225 MG NIGHTLY) 09/12/20   Nicholas Lose, MD  venlafaxine XR (EFFEXOR-XR) 75 MG 24 hr capsule TAKE 1 CAPSULE BY MOUTH DAILY IN COMBINATION WITH XR 150 MG (TO EQUAL 225 MG TOTAL DAILY). 09/12/20   Nicholas Lose, MD  vitamin E 200 UNIT capsule Take 200 Units by mouth daily.    [provider]    Family History Family History  Problem Relation Age of Onset   Breast cancer Mother 58   Heart disease Father    Prostate cancer Maternal Uncle 93   Cancer Maternal Aunt 63       d. from "female cancer" possibly cervical   Breast cancer Maternal Aunt 60   Prostate cancer  Paternal Uncle    Prostate cancer Maternal Grandfather 85   Prostate cancer Maternal Uncle 80   Prostate cancer Paternal Uncle    Breast cancer Cousin 67       mat 1st cousin    Social History Social History   Tobacco Use   Smoking status: Former    Packs/day: 0.25    Years: 1.00    Pack years: 0.25    Types: Cigarettes   Smokeless tobacco: Never  Vaping Use   Vaping Use: Never used  Substance Use Topics   Alcohol use: Not Currently   Drug use: No     Allergies   Aspirin and Penicillins   Review of Systems As stated in HPI otherwise negative   Physical Exam Triage Vital Signs ED Triage Vitals  Enc Vitals Group     BP 10/20/20 0942 (!) 148/78     Pulse Rate 10/20/20 0942 98     Resp 10/20/20 0942 (!) 28     Temp 10/20/20 0942 97.7 F (36.5 C)     Temp Source 10/20/20 0942 Oral     SpO2 10/20/20 0942 94 %     Weight --      Height --      Head Circumference --      Peak Flow --      Pain Score 10/20/20 0943 5     Pain Loc --      Pain Edu? --      Excl. in [place]? --    No data found.  Updated Vital Signs BP (!) 148/78 (BP Location: Left Arm)   Pulse 98   Temp 97.7 F (36.5 C) (Oral)   Resp (!) 28   SpO2 94%   Visual Acuity Right Eye Distance:   Left Eye Distance:   Bilateral Distance:    Right Eye Near:   Left Eye Near:    Bilateral Near:     Physical Exam Constitutional:      General: She is not in acute distress.    Appearance: Normal appearance. She is obese. She is  not ill-appearing or toxic-appearing.  HENT:     Head: Normocephalic and atraumatic.     Right Ear: Tympanic membrane normal. There is no impacted cerumen.     Left Ear: There is no impacted cerumen.     Ears:     Comments: Left TM with mild retraction, no erythema or exudate.  Canal normal, no  foreign body    Nose: Nose normal. No congestion or rhinorrhea.     Mouth/Throat:     Mouth: Mucous membranes are moist.     Pharynx: No oropharyngeal exudate or posterior  oropharyngeal erythema.  Eyes:     Extraocular Movements: Extraocular movements intact.     Conjunctiva/sclera: Conjunctivae normal.     Pupils: Pupils are equal, round, and reactive to light.     Comments: Mild right eye ptosis persistent times several weeks  Musculoskeletal:     Cervical back: Normal range of motion and neck supple. No rigidity or tenderness.  Lymphadenopathy:     Cervical: No cervical adenopathy.  Skin:    General: Skin is warm and dry.     Findings: No lesion or rash.  Neurological:     General: No focal deficit present.     Mental Status: She is alert.     Motor: No weakness.     Coordination: Coordination normal.     Gait: Gait normal.     Deep Tendon Reflexes: Reflexes normal.     Comments: 5/5 strength in upper and lower extremities  Psychiatric:        Mood and Affect: Mood normal.        Behavior: Behavior normal.     UC Treatments / Results  Labs (all labs ordered are listed, but only abnormal results are displayed) Labs Reviewed - No data to display  EKG   Radiology No results found.  Procedures Procedures (including critical care time)  Medications Ordered in UC Medications - No data to display  Initial Impression / Assessment and Plan / UC Course  I have reviewed the triage vital signs and the nursing notes.  Pertinent labs & imaging results that were available during my care of the patient were reviewed by me and considered in my medical decision making (see chart for details).  Hearing loss, left ear Tinnitus, left ear -Symptoms felt related to eustachian tube dysfunction as TM on affected side is mildly retracted -No evidence of neurological etiology as neuro exam benign.  She does have some mild right eye ptosis but states this has been present for over 1 month since migraine headache for which she was evaluated in the ER -Discussed use of nasal saline, Flonase.  Do not see any indication for decongestants -ENT referral should  symptoms persist -Start indications for follow-up for any worsening or new symptoms.  Patient verifies understanding  Reviewed expections re: course of current medical issues. Questions answered. Outlined signs and symptoms indicating need for more acute intervention. Pt verbalized understanding. AVS given   Final Clinical Impressions(s) / UC Diagnoses   Final diagnoses:  Eustachian tube dysfunction, left     Discharge Instructions      There does not seem to be any evidence of infection or foreign body in your ear.  I feel like your symptoms are related to eustachian tube dysfunction and I have given you some information on this.  Please try Flonase (or over-the-counter fluticasone) daily to see if this helps relieve symptoms.  You may also benefit from nasal saline used as we  discussed.  If symptoms persist, I have given you the number for ear nose and throat to follow-up with.       ED Prescriptions   None    PDMP not reviewed this encounter.   Rudolpho Sevin, NP 10/20/20 1046

## 2020-10-20 NOTE — ED Triage Notes (Signed)
Pt c/o slight headache, difficulty hearing in left ear with some dizziness and slight unsteady gait. Pt states there is no pain to the left ear and she is able to hear from the right and she does not think anything got into her ear. She is having ringing in her ear.   Symptoms started this morning per patient.  Patient has not taken any interventions.

## 2020-10-20 NOTE — Discharge Instructions (Addendum)
There does not seem to be any evidence of infection or foreign body in your ear.  I feel like your symptoms are related to eustachian tube dysfunction and I have given you some information on this.  Please try Flonase (or over-the-counter fluticasone) daily to see if this helps relieve symptoms.  You may also benefit from nasal saline used as we discussed.  If symptoms persist, I have given you the number for ear nose and throat to follow-up with.

## 2020-10-28 ENCOUNTER — Telehealth: Payer: Self-pay | Admitting: *Deleted

## 2020-10-28 NOTE — Telephone Encounter (Signed)
Letter for formal warning about cancellations and no shows sent to Ms Woollard through [facility] per Dr Aline August request.

## 2020-11-01 ENCOUNTER — Other Ambulatory Visit: Payer: Self-pay | Admitting: Nurse Practitioner

## 2020-11-10 NOTE — Progress Notes (Signed)
Date:  11/11/2020   ID:  Ashley Lindsey, DOB 24-May-1960, MRN [id number]  PCP:  [name], FNP  Cardiologist: Rex Kras, DO, [facility]  (established care 11/11/2020)  REASON FOR CONSULT: Syncope  REQUESTING PHYSICIAN:  [name], [place],  [place] [address]  Chief Complaint  Patient presents with   Loss of Consciousness   New Patient (Initial Visit)   Chest Pain    HPI  Ashley Lindsey is a 60 y.o. female who presents to the office with a chief complaint of " loss of consciousness and chest pain." Patient's past medical history and cardiovascular risk factors include: TIA, HTN, OSA (not on CPAP yet dx in June 2022), fibromyalgia, history of breast cancer bilaterally status post chemotherapy and radiation, former smoker, obesity due to excess calories.  She is referred to the office at the request of [name], FNP for evaluation of syncope.  In May 2022 patient states that she was at church sitting down enjoying the ceremony with family and had sudden onset of dizziness, and diaphoresis while at rest.  Shortly thereafter she was noted to lose consciousness for about 1 minute EMS was called and patient was taken to [facility] for further evaluation and management.  Patient states that at [facility] code stroke alert was activated and she underwent testing results reviewed and noted below for further reference.  Since the hospitalization she continues to feel dizzy while at rest, walking, changing positions, and turning her head side to side.  She states that turning her head side to side makes her symptoms more pronounced.  Her spells of dizziness are associated with diaphoresis, nausea and vomiting like sensation, palpitations.  She denies any tunnel vision.  No more syncopal events since May 2022.  No prior history of thyroid disease/anemia/no recent immobilization or surgeries, no diabetes or history of hypoglycemia.  No new  medications, no illicits, alcohol consumption, energy drinks.  Patient has started using herbal supplements approximately 2 months ago and consumes 2 cups of NT coffee on a daily basis and 2 cups of soda.  When patient was getting her orthostatic vital signs she became symptomatic though her numbers were within normal limits.  According to the medical assistants her eyes had rolled back she was hypoxic and felt lightheaded and dizzy.  She did not pass out.  Chest pain: Patient states that she experiences chest discomfort couple times a week, located substernally, lasting for few seconds, worse with effort related activities, resolves with resting.  She has been attributing her symptoms to heartburn and her symptoms do improve with PPIs.  When she was hospitalized in May 2022 she did have similar chest pain.  ALLERGIES: Allergies  Allergen Reactions   Aspirin     unknown   Penicillins Hives and Swelling    Has patient had a PCN reaction causing immediate rash, facial/tongue/throat swelling, SOB or lightheadedness with hypotension: Yes Has patient had a PCN reaction causing severe rash involving mucus membranes or skin necrosis: Yes Has patient had a PCN reaction that required hospitalization Yes Has patient had a PCN reaction occurring within the last 10 years: No If all of the above answers are "NO", then may proceed with Cephalosporin use.     MEDICATION LIST PRIOR TO VISIT: Current Meds  Medication Sig   acetaminophen (TYLENOL) 650 MG CR tablet Take 650 mg by mouth every 8 (eight) hours as needed for pain.   albuterol (VENTOLIN HFA) 108 (  90 Base) MCG/ACT inhaler INHALE 1-2 PUFFS INTO THE LUNGS EVERY 6 (SIX) HOURS AS NEEDED FOR WHEEZING OR SHORTNESS OF BREATH.   Bisacodyl (DULCOLAX PO) Take 1 capsule by mouth daily as needed (constipation).    cholecalciferol 5000 units TABS Take 1 tablet (5,000 Units total) by mouth 2 (two) times daily. (Patient taking differently: Take 5,000 Units by  mouth daily.)   cyclobenzaprine (FLEXERIL) 10 MG tablet Take 1 tablet (10 mg total) by mouth 3 (three) times daily as needed for muscle spasms.   DEXILANT 60 MG capsule TAKE 1 CAPSULE BY MOUTH EVERY DAY   diphenhydrAMINE (BENADRYL) 25 MG tablet Take 1 tablet (25 mg total) by mouth every 6 (six) hours as needed for up to 20 doses (migraine). Take with reglan for migraine headache   fish oil-omega-3 fatty acids 1000 MG capsule Take 1 g by mouth daily.   Flaxseed, Linseed, (FLAXSEED OIL MAX STR PO) Take 1 capsule by mouth daily.    gabapentin (NEURONTIN) 300 MG capsule TAKE 1 CAPSULE BY MOUTH THREE TIMES A DAY   Iron-FA-B Cmp-C-Biot-Probiotic (FUSION PLUS) CAPS Take 1 capsule by mouth daily.   meloxicam (MOBIC) 7.5 MG tablet TAKE 1 TABLET BY MOUTH EVERY DAY   Multiple Vitamins-Minerals (MULTIVITAMIN & MINERAL PO) Take 1 tablet by mouth daily.   omeprazole (PRILOSEC) 20 MG capsule Take 20 mg by mouth daily.   ondansetron (ZOFRAN) 4 MG tablet TAKE 1 TABLET BY MOUTH DAILY AS NEEDED FOR NAUSEA OR VOMITING   OVER THE COUNTER MEDICATION Take 125 mg by mouth daily. [place]ig[place]/Simethicone   propranolol (INDERAL) 20 MG tablet TAKE 1 TABLET BY MOUTH TWICE A DAY   SYMBICORT 80-4.5 MCG/ACT inhaler INHALE 2 PUFFS INTO THE LUNGS IN THE MORNING AND AT BEDTIME.   triamcinolone cream (KENALOG) 0.1 % Apply 1 application topically 2 (two) times daily. Apply for up to one week. (Patient taking differently: Apply 1 application topically 2 (two) times daily as needed (rash).)   Turmeric (QC TUMERIC COMPLEX) 500 MG CAPS Take by mouth.   venlafaxine XR (EFFEXOR-XR) 150 MG 24 hr capsule TAKE 1 CAPSULE BY MOUTH NIGHTLY WITH 75 MG CAPSULE (TOTAL OF 225 MG NIGHTLY)   venlafaxine XR (EFFEXOR-XR) 75 MG 24 hr capsule TAKE 1 CAPSULE BY MOUTH DAILY IN COMBINATION WITH XR 150 MG (TO EQUAL 225 MG TOTAL DAILY).   vitamin E 200 UNIT capsule Take 200 Units by mouth daily.   [DISCONTINUED] hydrochlorothiazide (HYDRODIURIL) 25 MG tablet TAKE  1 TABLET BY MOUTH EVERY DAY (Patient taking differently: Take 25 mg by mouth daily.)     PAST MEDICAL HISTORY: Past Medical History:  Diagnosis Date   Abdominal cramps    Arthritis    Cancer ([place])    s/p radiation- last dose 6/12//has been off Tamoxifen 8/12   Chronic headaches    Costochondritis    Fatigue    Fibromyalgia 03/06/2013   Hypertension    Lymphedema    RIGHT ARM-////  STATES USE LEFT ARM FOR BP'S   Migraines    Muscle spasms of head and/or neck    following to the breast   Passed out    4th of july   Personal history of radiation therapy 2010   lt breast    S/P radiation therapy 08/19/07 - 10/10/07   Left Breast/5040 cGy/28 fractions with Boost for a toatl dose of 6300 dGy   S/P radiation therapy 08/14/10 -10/03/10   right breast   Sleep apnea    STOP BANG SCORE 5  PAST SURGICAL HISTORY: Past Surgical History:  Procedure Laterality Date   ABDOMINAL HYSTERECTOMY     with left salpingooophorectomy   BREAST LUMPECTOMY  2009/2012   LEFT/RIGHT with lymph node dissection   KNEE ARTHROSCOPY     right   OOPHORECTOMY  2013    FAMILY HISTORY: The patient family history includes Breast cancer (age of onset: 43) in her cousin; Breast cancer (age of onset: 61) in her maternal aunt; Breast cancer (age of onset: 67) in her mother; Cancer (age of onset: 64) in her maternal aunt; Heart disease in her father; Prostate cancer in her paternal uncle and paternal uncle; Prostate cancer (age of onset: 61) in her maternal uncle; Prostate cancer (age of onset: 69) in her maternal uncle; Prostate cancer (age of onset: 10) in her maternal grandfather.  SOCIAL HISTORY:  The patient  reports that she has quit smoking. Her smoking use included cigarettes. She has a 0.25 pack-year smoking history. She has never used smokeless tobacco. She reports previous alcohol use. She reports that she does not use drugs.  REVIEW OF SYSTEMS: Review of Systems  Constitutional: Negative for chills  and fever.  HENT:  Negative for hoarse voice and nosebleeds.   Eyes:  Negative for discharge, double vision and pain.  Cardiovascular:  Positive for chest pain, leg swelling (better in morning), orthopnea, palpitations and syncope (Last episode May 2022). Negative for claudication, dyspnea on exertion, near-syncope and paroxysmal nocturnal dyspnea.  Respiratory:  Negative for hemoptysis and shortness of breath.   Musculoskeletal:  Negative for muscle cramps and myalgias.  Gastrointestinal:  Negative for abdominal pain, constipation, diarrhea, hematemesis, hematochezia, melena, nausea and vomiting.  Neurological:  Positive for dizziness and light-headedness.   PHYSICAL EXAM: Vitals with BMI 11/11/2020 11/11/2020 10/20/2020  Height - 5\' 2"  -  Weight - 299 lbs -  BMI - 62.13 -  Systolic 086 578 469  Diastolic 73 78 78  Pulse 76 84 98   Orthostatic VS for the past 72 hrs (Last 3 readings):  Orthostatic BP Patient Position BP Location Cuff Size Orthostatic Pulse  11/11/20 1105 (!) 143/117 Standing Left Arm Large 74  11/11/20 1101 124/85 Sitting Left Arm Large 77  11/11/20 1057 137/82 Supine Left Arm Large 75     CONSTITUTIONAL: Appears older than stated age, hemodynamically stable well-developed and well-nourished. No acute distress.  SKIN: Skin is warm and dry. No rash noted. No cyanosis. No pallor. No jaundice HEAD: Normocephalic and atraumatic.  EYES: No scleral icterus MOUTH/THROAT: Moist oral membranes.  NECK: Unable to evaluate JVP due to short neck stature and adipose tissue, no thyromegaly noted. No carotid bruits  LYMPHATIC: No visible cervical adenopathy.  CHEST Normal respiratory effort. No intercostal retractions  LUNGS: Clear to auscultation bilaterally.  No stridor. No wheezes. No rales.  CARDIOVASCULAR: Regular rate and rhythm, positive S1-S2, no murmurs rubs or gallops appreciated ABDOMINAL: Obese, soft, nontender, nondistended, positive bowel sounds in all 4 quadrants no  apparent ascites.  EXTREMITIES: Trace bilateral peripheral edema, palpable DP and PT pulses. HEMATOLOGIC: No significant bruising NEUROLOGIC: Oriented to person, place, and time. Nonfocal. Normal muscle tone.  PSYCHIATRIC: Normal mood and affect. Normal behavior. Cooperative  CARDIAC DATABASE: EKG: 11/11/2020: Normal sinus rhythm, 75 bpm, left atrial enlargement, without underlying ischemia or injury pattern.   CTA Head and Neck w/ and w/o contrast: 09/11/2020: 1. CT perfusion negative for acute infarct or ischemia 2. Negative for intracranial large vessel occlusion 3. No significant carotid or vertebral artery stenosis in the  neck. No intracranial stenosis. 4. Code stroke imaging results were communicated on 09/11/2020 at 2:34 pm to provider Rory Percy via text page  CT Head w/o contrast: 09/11/2020 1. Negative CT head 2. ASPECTS is 10 3. Code stroke imaging results were communicated on 09/11/2020 at 1:59 pm to provider Rory Percy via text page  MRI brain with and without contrast: 10/04/2020 1. No specific cause for symptoms.  No acute or subacute infarct. 2. Mild white matter disease without specific demyelinating pattern.  LABORATORY DATA: CBC Latest Ref Rng & Units 10/05/2020 09/11/2020 09/11/2020  WBC 4.0 - 10.5 K/uL 9.1 - 10.2  Hemoglobin 12.0 - 15.0 g/dL 11.7(L) 15.6(H) 13.3  Hematocrit 36.0 - 46.0 % 38.6 46.0 45.6  Platelets 150 - 400 K/uL 258 - 368    CMP Latest Ref Rng & Units 10/05/2020 09/11/2020 09/11/2020  Glucose 70 - 99 mg/dL 134(H) 107(H) 102(H)  BUN 8 - 23 mg/dL 10 6(L) 7(L)  Creatinine 0.44 - 1.00 mg/dL 0.72 0.60 0.70  Sodium 135 - 145 mmol/L 139 139 136  Potassium 3.5 - 5.1 mmol/L 4.2 3.7 3.9  Chloride 98 - 111 mmol/L 99 100 99  CO2 22 - 32 mmol/L 32 - 22  Calcium 8.9 - 10.3 mg/dL 9.8 - 9.4  Total Protein 6.5 - 8.1 g/dL 7.4 - 8.1  Total Bilirubin 0.3 - 1.2 mg/dL <0.2(L) - 0.5  Alkaline Phos 38 - 126 U/L 80 - 60  AST 15 - 41 U/L 14(L) - 25  ALT 0 - 44 U/L 16 - 18     Lipid Panel     Component Value Date/Time   CHOL 203 (H) 08/03/2020 1205   TRIG 259 (H) 08/03/2020 1205   HDL 62 08/03/2020 1205   CHOLHDL 3.3 08/03/2020 1205   LDLCALC 98 08/03/2020 1205   LABVLDL 43 (H) 08/03/2020 1205    No components found for: NTPROBNP No results for input(s): PROBNP in the last 8760 hours. No results for input(s): TSH in the last 8760 hours.  BMP Recent Labs    02/03/20 1243 02/03/20 1243 02/17/20 1129 08/03/20 1205 09/11/20 1349 09/11/20 1353 10/05/20 1141  NA 143   < > 138 140 136 139 139  K 4.9  --  3.7 4.6 3.9 3.7 4.2  CL 102  --  95* 100 99 100 99  CO2 29  --  33* 26 22  --  32  GLUCOSE 96   < > 103* 105* 102* 107* 134*  BUN 8   < > 13 14 7* 6* 10  CREATININE 0.67  --  0.62 0.66 0.70 0.60 0.72  CALCIUM 9.2  --  9.1 9.1 9.4  --  9.8  GFRNONAA 96  --  >60  --  >60  --  >60  GFRAA 110  --   --   --   --   --   --    < > = values in this interval not displayed.    HEMOGLOBIN A1C No results found for: HGBA1C, MPG   IMPRESSION:    ICD-10-CM   1. Syncope and collapse  R55 EKG 12-Lead    CT Angio Chest Pulmonary Embolism (PE) W or WO Contrast    PCV ECHOCARDIOGRAM COMPLETE    LONG TERM MONITOR-LIVE TELEMETRY (3-14 DAYS)    PCV CAROTID DUPLEX (BILATERAL)    2. Hypoxia  R09.02     3. Precordial pain  R07.2 PCV MYOCARDIAL PERFUSION WITH LEXISCAN       RECOMMENDATIONS: Ashley Lindsey  is a 60 y.o. female whose past medical history and cardiac risk factors include: HTN, OSA (not on CPAP yet dx in June 2022), fibromyalgia, history of breast cancer bilaterally status post chemotherapy and radiation, former smoker, obesity due to excess calories.  Syncope Index event May 2022. Orthostatic vital signs negative but she did become symptomatic with eyes rolling backward, feeling lightheaded and dizzy, hypoxic and mild diaphoresis. POC blood sugar 107. Given her syncopal event and hypoxia would like to rule out pulmonary embolism.  Will check  CT PE protocol patient is referred to [facility] today as part of a stat evaluation. Symptoms are suggestive of vasovagal etiology. I suspect that her symptoms are also secondary to polypharmacy (Flexeril, gabapentin, HCTZ).   In addition, patient does not eat and drink on a regular basis.   I have also asked her to discontinue her herbal supplements. Echocardiogram will be ordered to evaluate for structural heart disease and left ventricular systolic function. Carotid duplex. 14-day mobile cardiac ambulatory telemetry to rule out dysrhythmias. Recommend holding HCTZ as her blood pressures are currently soft. Most recent labs from May 2022 reviewed. Patient has an appointment with neurology in August 2022. Recommend being evaluated by ENT as her symptoms are exacerbated by moving her head side to side.  Precordial pain: EKG shows normal sinus rhythm without underlying ischemia or injury pattern. Echocardiogram as discussed above Lexiscan to evaluate for reversible ischemia  Benign essential hypertension: Office blood pressures are currently soft. Symptoms suggestive of vasovagal syncope and therefore recommending holding HCTZ for now. Patient is also recommended to minimize her other medications such as Flexeril Effexor, gabapentin if medically possible.  Obstructive sleep apnea: Diagnosed in June 2022.Marland Kitchen   Awaiting a CPAP machine.  Total encounter time 60 minutes. *Total Encounter Time as defined by the Centers for Medicare and Medicaid Services includes, in addition to the face-to-face time of a patient visit (documented in the note above) non-face-to-face time: obtaining and reviewing outside history, ordering and reviewing medications, tests or procedures, care coordination (communications with other health care professionals or caregivers) and documentation in the medical record. As part of this consultation reviewed recent hospitalization visit, imaging results, medication  reconciliation, discussing disease management, and ordering diagnostic testing.  FINAL MEDICATION LIST END OF ENCOUNTER: No orders of the defined types were placed in this encounter.   Medications Discontinued During This Encounter  Medication Reason   rizatriptan (MAXALT-MLT) 10 MG disintegrating tablet Error   senna (SENOKOT) 8.6 MG tablet Error   UNABLE TO FIND Error   Magnesium 300 MG CAPS Error   hydrochlorothiazide (HYDRODIURIL) 25 MG tablet Discontinued by provider     Current Outpatient Medications:    acetaminophen (TYLENOL) 650 MG CR tablet, Take 650 mg by mouth every 8 (eight) hours as needed for pain., Disp: , Rfl:    albuterol (VENTOLIN HFA) 108 (90 Base) MCG/ACT inhaler, INHALE 1-2 PUFFS INTO THE LUNGS EVERY 6 (SIX) HOURS AS NEEDED FOR WHEEZING OR SHORTNESS OF BREATH., Disp: 6.7 each, Rfl: 1   Bisacodyl (DULCOLAX PO), Take 1 capsule by mouth daily as needed (constipation). , Disp: , Rfl:    cholecalciferol 5000 units TABS, Take 1 tablet (5,000 Units total) by mouth 2 (two) times daily. (Patient taking differently: Take 5,000 Units by mouth daily.), Disp: , Rfl:    cyclobenzaprine (FLEXERIL) 10 MG tablet, Take 1 tablet (10 mg total) by mouth 3 (three) times daily as needed for muscle spasms., Disp: 90 tablet, Rfl: 2   DEXILANT 60  MG capsule, TAKE 1 CAPSULE BY MOUTH EVERY DAY, Disp: 90 capsule, Rfl: 1   diphenhydrAMINE (BENADRYL) 25 MG tablet, Take 1 tablet (25 mg total) by mouth every 6 (six) hours as needed for up to 20 doses (migraine). Take with reglan for migraine headache, Disp: 20 tablet, Rfl: 0   fish oil-omega-3 fatty acids 1000 MG capsule, Take 1 g by mouth daily., Disp: , Rfl:    Flaxseed, Linseed, (FLAXSEED OIL MAX STR PO), Take 1 capsule by mouth daily. , Disp: , Rfl:    gabapentin (NEURONTIN) 300 MG capsule, TAKE 1 CAPSULE BY MOUTH THREE TIMES A DAY, Disp: 90 capsule, Rfl: 3   Iron-FA-B Cmp-C-Biot-Probiotic (FUSION PLUS) CAPS, Take 1 capsule by mouth daily., Disp:  30 capsule, Rfl: 3   meloxicam (MOBIC) 7.5 MG tablet, TAKE 1 TABLET BY MOUTH EVERY DAY, Disp: 90 tablet, Rfl: 0   Multiple Vitamins-Minerals (MULTIVITAMIN & MINERAL PO), Take 1 tablet by mouth daily., Disp: , Rfl:    omeprazole (PRILOSEC) 20 MG capsule, Take 20 mg by mouth daily., Disp: , Rfl:    ondansetron (ZOFRAN) 4 MG tablet, TAKE 1 TABLET BY MOUTH DAILY AS NEEDED FOR NAUSEA OR VOMITING, Disp: 30 tablet, Rfl: 1   OVER THE COUNTER MEDICATION, Take 125 mg by mouth daily. [place]/Simethicone, Disp: , Rfl:    propranolol (INDERAL) 20 MG tablet, TAKE 1 TABLET BY MOUTH TWICE A DAY, Disp: 180 tablet, Rfl: 0   SYMBICORT 80-4.5 MCG/ACT inhaler, INHALE 2 PUFFS INTO THE LUNGS IN THE MORNING AND AT BEDTIME., Disp: 30.6 each, Rfl: 1   triamcinolone cream (KENALOG) 0.1 %, Apply 1 application topically 2 (two) times daily. Apply for up to one week. (Patient taking differently: Apply 1 application topically 2 (two) times daily as needed (rash).), Disp: 30 g, Rfl: 0   Turmeric (QC TUMERIC COMPLEX) 500 MG CAPS, Take by mouth., Disp: , Rfl:    venlafaxine XR (EFFEXOR-XR) 150 MG 24 hr capsule, TAKE 1 CAPSULE BY MOUTH NIGHTLY WITH 75 MG CAPSULE (TOTAL OF 225 MG NIGHTLY), Disp: 90 capsule, Rfl: 0   venlafaxine XR (EFFEXOR-XR) 75 MG 24 hr capsule, TAKE 1 CAPSULE BY MOUTH DAILY IN COMBINATION WITH XR 150 MG (TO EQUAL 225 MG TOTAL DAILY)., Disp: 90 capsule, Rfl: 0   vitamin E 200 UNIT capsule, Take 200 Units by mouth daily., Disp: , Rfl:   Orders Placed This Encounter  Procedures   CT Angio Chest Pulmonary Embolism (PE) W or WO Contrast   LONG TERM MONITOR-LIVE TELEMETRY (3-14 DAYS)   PCV MYOCARDIAL PERFUSION WITH LEXISCAN   EKG 12-Lead   PCV ECHOCARDIOGRAM COMPLETE   PCV CAROTID DUPLEX (BILATERAL)    There are no Patient Instructions on file for this visit.   --Continue cardiac medications as reconciled in final medication list. --Return in about 6 weeks (around 12/23/2020) for Follow up syncope, review test  results. Or sooner if needed. --Continue follow-up with your primary care physician regarding the management of your other chronic comorbid conditions.  Patient's questions and concerns were addressed to her satisfaction. She voices understanding of the instructions provided during this encounter.   This note was created using a voice recognition software as a result there may be grammatical errors inadvertently enclosed that do not reflect the nature of this encounter. Every attempt is made to correct such errors.  Rex Kras, [place], [facility]  Pager: [phone] Office: [phone]

## 2020-11-11 ENCOUNTER — Ambulatory Visit
Admission: RE | Admit: 2020-11-11 | Discharge: 2020-11-11 | Disposition: A | Discharge status: Home / Self Care | Payer: Medicare Other | Source: Ambulatory Visit | Attending: Cardiology | Admitting: Cardiology

## 2020-11-11 ENCOUNTER — Ambulatory Visit: Payer: Medicare Other | Admitting: Cardiology

## 2020-11-11 ENCOUNTER — Other Ambulatory Visit: Payer: Self-pay

## 2020-11-11 ENCOUNTER — Encounter: Payer: Self-pay | Admitting: Cardiology

## 2020-11-11 VITALS — BP 117/73 | HR 76 | Temp 97.8°F | Resp 16 | Ht 62.0 in | Wt 299.0 lb

## 2020-11-11 DIAGNOSIS — R55 Syncope and collapse: Secondary | ICD-10-CM

## 2020-11-11 DIAGNOSIS — R0902 Hypoxemia: Secondary | ICD-10-CM

## 2020-11-11 DIAGNOSIS — Z853 Personal history of malignant neoplasm of breast: Secondary | ICD-10-CM | POA: Diagnosis not present

## 2020-11-11 DIAGNOSIS — R072 Precordial pain: Secondary | ICD-10-CM

## 2020-11-11 DIAGNOSIS — I517 Cardiomegaly: Secondary | ICD-10-CM | POA: Diagnosis not present

## 2020-11-11 DIAGNOSIS — J9811 Atelectasis: Secondary | ICD-10-CM | POA: Diagnosis not present

## 2020-11-11 MED ORDER — IOPAMIDOL (ISOVUE-370) INJECTION 76%
75.0000 mL | Freq: Once | INTRAVENOUS | Status: AC | PRN
  Administered 2020-11-11: 75 mL via INTRAVENOUS

## 2020-11-15 ENCOUNTER — Other Ambulatory Visit: Payer: Self-pay | Admitting: Nurse Practitioner

## 2020-11-16 ENCOUNTER — Other Ambulatory Visit: Payer: Self-pay | Admitting: Hematology and Oncology

## 2020-11-16 ENCOUNTER — Other Ambulatory Visit: Payer: Self-pay | Admitting: Nurse Practitioner

## 2020-11-16 DIAGNOSIS — C50419 Malignant neoplasm of upper-outer quadrant of unspecified female breast: Secondary | ICD-10-CM

## 2020-11-16 DIAGNOSIS — M797 Fibromyalgia: Secondary | ICD-10-CM

## 2020-11-22 ENCOUNTER — Other Ambulatory Visit: Payer: Medicare Other

## 2020-11-22 ENCOUNTER — Encounter (HOSPITAL_COMMUNITY): Payer: Self-pay | Admitting: Emergency Medicine

## 2020-11-22 ENCOUNTER — Inpatient Hospital Stay: Payer: Medicare Other

## 2020-11-22 ENCOUNTER — Ambulatory Visit (HOSPITAL_COMMUNITY)
Admission: EM | Admit: 2020-11-22 | Discharge: 2020-11-22 | Disposition: A | Discharge status: Home / Self Care | Payer: Medicare Other | Attending: Emergency Medicine | Admitting: Emergency Medicine

## 2020-11-22 ENCOUNTER — Other Ambulatory Visit: Payer: Self-pay

## 2020-11-22 DIAGNOSIS — U071 COVID-19: Secondary | ICD-10-CM | POA: Insufficient documentation

## 2020-11-22 DIAGNOSIS — R55 Syncope and collapse: Secondary | ICD-10-CM

## 2020-11-22 DIAGNOSIS — J069 Acute upper respiratory infection, unspecified: Secondary | ICD-10-CM | POA: Diagnosis not present

## 2020-11-22 LAB — SARS CORONAVIRUS 2 (TAT 6-24 HRS): SARS Coronavirus 2: POSITIVE — AB

## 2020-11-22 MED ORDER — PROMETHAZINE-DM 6.25-15 MG/5ML PO SYRP: 5.0000 mL | ORAL_SOLUTION | Freq: Four times a day (QID) | ORAL | 0 refills | Status: DC | PRN

## 2020-11-22 NOTE — Discharge Instructions (Addendum)
We will notify you if your COVID test is positive.  It will also show up in your MyChart.   You can take the Promethazine DM as needed for cough at night.  It can make you sleepy so do not take ir prior to driving.    You can take Tylenol and/or Ibuprofen as needed for fever reduction and pain relief.   For cough: honey 1/2 to 1 teaspoon (you can dilute the honey in water or another fluid).  You can also use guaifenesin and dextromethorphan for cough. You can use a humidifier for chest congestion and cough.  If you don't have a humidifier, you can sit in the bathroom with the hot shower running.     For sore throat: try warm salt water gargles, cepacol lozenges, throat spray, warm tea or water with lemon/honey, popsicles or ice, or OTC cold relief medicine for throat discomfort.    For congestion: take a daily anti-histamine like Zyrtec, Claritin, and a oral decongestant, such as pseudoephedrine.  You can also use Flonase 1-2 sprays in each nostril daily.    It is important to stay hydrated: drink plenty of fluids (water, gatorade/powerade/pedialyte, juices, or teas) to keep your throat moisturized and help further relieve irritation/discomfort.   Return or go to the Emergency Department if symptoms worsen or do not improve in the next few days.

## 2020-11-22 NOTE — ED Triage Notes (Signed)
Cough, chills, headache, sweating, loss of taste.  Mother is covid positive.   Patient has not done a covid test.   Patient has felt bad 8 days

## 2020-11-22 NOTE — ED Provider Notes (Signed)
[place]    CSN: [id number] Arrival date & time: 11/22/20  [id number]      History   Chief Complaint Chief Complaint  Patient presents with   Cough    HPI Ashley Lindsey is a 60 y.o. female.   Patient here for evaluation of cough, fatigue, loss of smell and taste that has been ongoing for the past 8 days. Reports mother tested positive for COVID yesterday.  Reports having a fever last week with symptoms.  Has not taken a COVID test.  Reports taking OTC medication with some relief of symptoms.  Denies any trauma, injury, or other precipitating event.  Denies any specific alleviating or aggravating factors.  Denies any chest pain, shortness of breath, N/V/D, numbness, tingling, weakness, abdominal pain.    The history is provided by the patient.  Cough Associated symptoms: myalgias    Past Medical History:  Diagnosis Date   Abdominal cramps    Arthritis    Cancer ([place])    s/p radiation- last dose 6/12//has been off Tamoxifen 8/12   Chronic headaches    Costochondritis    Fatigue    Fibromyalgia 03/06/2013   Hypertension    Lymphedema    RIGHT ARM-////  STATES USE LEFT ARM FOR BP'S   Migraines    Muscle spasms of head and/or neck    following to the breast   Passed out    4th of july   Personal history of radiation therapy 2010   lt breast    S/P radiation therapy 08/19/07 - 10/10/07   Left Breast/5040 cGy/28 fractions with Boost for a toatl dose of 6300 dGy   S/P radiation therapy 08/14/10 -10/03/10   right breast   Sleep apnea    STOP BANG SCORE 5    Patient Active Problem List   Diagnosis Date Noted   Bilateral primary osteoarthritis of knee 02/09/2020   Chronic intermittent hypoxia with obstructive sleep apnea 08/24/2019   Chronic intractable headache 07/14/2019   Morning headache 07/14/2019   Loud snoring 07/14/2019   Super obesity 07/14/2019   Sleeps in sitting position due to orthopnea 07/14/2019   Chest pain due to GERD 07/14/2019   Nocturia more  than twice per night 07/14/2019   Unilateral primary osteoarthritis, right knee 05/26/2019   Chronic migraine without aura without status migrainosus, not intractable 05/13/2019   Class 3 severe obesity due to excess calories without serious comorbidity with body mass index (BMI) of 40.0 to 44.9 in adult ([place]) 08/05/2018   Depression 08/05/2018   Chronic nonintractable headache 08/05/2018   Elevated blood-pressure reading without diagnosis of hypertension 06/06/2018   Chronic pain of right ankle 04/24/2018   Breast cancer of lower-outer quadrant of right female breast ([place]) 12/02/2014   Bronchospasm 09/21/2014   Dyspnea 09/21/2014   Chronic pain of right knee 09/21/2014   Fibromyalgia 03/06/2013   Dense breasts 03/06/2013   Mastalgia 03/06/2013   Back pain, lumbosacral 03/06/2013   Lymphedema of arm 03/06/2013   Atypical chest pain 01/06/2013   Breast cancer of upper-inner quadrant of left female breast ([place]) 09/08/2012   Family history of malignant neoplasm of breast 09/08/2012   S/P radiation therapy    History of breast cancer in female 08/13/2011    Past Surgical History:  Procedure Laterality Date   ABDOMINAL HYSTERECTOMY     with left salpingooophorectomy   BREAST LUMPECTOMY  2009/2012   LEFT/RIGHT with lymph node dissection   KNEE ARTHROSCOPY     right  OOPHORECTOMY  2013    OB History   No obstetric history on file.      Home Medications    Prior to Admission medications   Medication Sig Start Date End Date Taking? Authorizing Provider  promethazine-dextromethorphan (PROMETHAZINE-DM) 6.25-15 MG/5ML syrup Take 5 mLs by mouth 4 (four) times daily as needed for cough. 11/22/20  Yes Pearson Forster, NP  acetaminophen (TYLENOL) 650 MG CR tablet Take 650 mg by mouth every 8 (eight) hours as needed for pain.    [provider]  albuterol (VENTOLIN HFA) 108 (90 Base) MCG/ACT inhaler INHALE 1-2 PUFFS INTO THE LUNGS EVERY 6 (SIX) HOURS AS NEEDED FOR WHEEZING OR  SHORTNESS OF BREATH. 05/03/20   Nicholas Lose, MD  Bisacodyl (DULCOLAX PO) Take 1 capsule by mouth daily as needed (constipation).     [provider]  cholecalciferol 5000 units TABS Take 1 tablet (5,000 Units total) by mouth 2 (two) times daily. Patient taking differently: Take 5,000 Units by mouth daily. 07/21/15   Nicholas Lose, MD  cyclobenzaprine (FLEXERIL) 10 MG tablet TAKE 1 TABLET BY MOUTH EVERY DAY AS NEEDED 11/16/20   [name], FNP  DEXILANT 60 MG capsule TAKE 1 CAPSULE BY MOUTH EVERY DAY 08/30/20   [name], FNP  diphenhydrAMINE (BENADRYL) 25 MG tablet Take 1 tablet (25 mg total) by mouth every 6 (six) hours as needed for up to 20 doses (migraine). Take with reglan for migraine headache 09/11/20   Wyvonnia Dusky, MD  fish oil-omega-3 fatty acids 1000 MG capsule Take 1 g by mouth daily.    [provider]  Flaxseed, Linseed, (FLAXSEED OIL MAX STR PO) Take 1 capsule by mouth daily.     [provider]  gabapentin (NEURONTIN) 300 MG capsule TAKE 1 CAPSULE BY MOUTH THREE TIMES A DAY 10/06/20   Raulkar, Clide Deutscher, MD  Iron-FA-B Cmp-C-Biot-Probiotic (FUSION PLUS) CAPS Take 1 capsule by mouth daily. 06/23/18   [name], FNP  meloxicam (MOBIC) 7.5 MG tablet TAKE 1 TABLET BY MOUTH EVERY DAY 11/16/20   [name], FNP  Multiple Vitamins-Minerals (MULTIVITAMIN & MINERAL PO) Take 1 tablet by mouth daily.    [provider]  omeprazole (PRILOSEC) 20 MG capsule Take 20 mg by mouth daily.    [provider]  ondansetron (ZOFRAN) 4 MG tablet TAKE 1 TABLET BY MOUTH DAILY AS NEEDED FOR NAUSEA OR VOMITING 11/01/20   [name], FNP  OVER THE COUNTER MEDICATION Take 125 mg by mouth daily. [place]/Simethicone    [provider]  propranolol (INDERAL) 20 MG tablet TAKE 1 TABLET BY MOUTH TWICE A DAY 09/27/20   [name], FNP  SYMBICORT 80-4.5 MCG/ACT inhaler INHALE 2 PUFFS INTO THE LUNGS IN THE MORNING AND AT BEDTIME. 11/15/20   [name], FNP  triamcinolone cream (KENALOG) 0.1 % Apply 1 application topically 2 (two) times daily. Apply for up to one week. Patient taking differently: Apply 1 application topically 2 (two) times daily as needed (rash). 12/29/16   Vanessa Kick, MD  Turmeric (QC TUMERIC COMPLEX) 500 MG CAPS Take by mouth.    [provider]  venlafaxine XR (EFFEXOR-XR) 150 MG 24 hr capsule TAKE 1 CAPSULE BY MOUTH NIGHTLY WITH 75 MG CAPSULE (TOTAL OF 225 MG NIGHTLY) 11/16/20   Nicholas Lose, MD  venlafaxine XR (EFFEXOR-XR) 75 MG 24 hr capsule TAKE 1 CAPSULE BY MOUTH DAILY IN COMBINATION WITH XR 150 MG (TO EQUAL 225 MG TOTAL DAILY). 09/12/20   Nicholas Lose, MD  vitamin E 200 UNIT capsule Take 200 Units by mouth daily.    [provider]    Family History Family History  Problem Relation Age of Onset   Breast cancer Mother 30   Heart disease Father    Cancer Maternal Aunt 35       d. from "female cancer" possibly cervical   Breast cancer Maternal Aunt 60   Prostate cancer Maternal Uncle 78   Prostate cancer Maternal Uncle 80   Prostate cancer Paternal Uncle    Prostate cancer Paternal Uncle    Prostate cancer Maternal Grandfather 75   Breast cancer Cousin 28       mat 1st cousin    Social History Social History   Tobacco Use   Smoking status: Former    Packs/day: 0.25    Years: 1.00    Pack years: 0.25    Types: Cigarettes   Smokeless tobacco: Never  Vaping Use   Vaping Use: Never used  Substance Use Topics   Alcohol use: Not Currently   Drug use: No     Allergies   Aspirin and Penicillins   Review of Systems Review of Systems  Constitutional:  Positive for fatigue.  HENT:  Positive for congestion.   Respiratory:  Positive for cough.   Musculoskeletal:  Positive for myalgias.  All other systems reviewed and are negative.   Physical Exam Triage Vital Signs ED Triage Vitals  Enc Vitals Group     BP 11/22/20 1029 137/89     Pulse Rate 11/22/20 1029 78     Resp  11/22/20 1029 (!) 22     Temp 11/22/20 1029 98.6 F (37 C)     Temp Source 11/22/20 1029 Oral     SpO2 11/22/20 1029 94 %     Weight --      Height --      Head Circumference --      Peak Flow --      Pain Score 11/22/20 1025 8     Pain Loc --      Pain Edu? --      Excl. in [place]? --    No data found.  Updated Vital Signs BP 137/89 (BP Location: Right Arm) Comment (BP Location): regular cuff, forearm  Pulse 78   Temp 98.6 F (37 C) (Oral)   Resp (!) 22   SpO2 94%   Visual Acuity Right Eye Distance:   Left Eye Distance:   Bilateral Distance:    Right Eye Near:   Left Eye Near:    Bilateral Near:     Physical Exam Vitals and nursing note reviewed.  Constitutional:      General: She is not in acute distress.    Appearance: Normal appearance. She is not ill-appearing, toxic-appearing or diaphoretic.  HENT:     Head: Normocephalic and atraumatic.  Eyes:     Conjunctiva/sclera: Conjunctivae normal.  Cardiovascular:     Rate and Rhythm: Normal rate.     Pulses: Normal pulses.     Heart sounds: Normal heart sounds.  Pulmonary:     Effort: Pulmonary effort is normal.     Breath sounds: Normal breath sounds.  Abdominal:     General: Abdomen is flat.  Musculoskeletal:        General: Normal range of motion.     Cervical back: Normal range of motion.  Skin:    General: Skin is warm and dry.  Neurological:     General: No focal  deficit present.     Mental Status: She is alert and oriented to person, place, and time.  Psychiatric:        Mood and Affect: Mood normal.     UC Treatments / Results  Labs (all labs ordered are listed, but only abnormal results are displayed) Labs Reviewed  SARS CORONAVIRUS 2 (TAT 6-24 HRS)    EKG   Radiology No results found.  Procedures Procedures (including critical care time)  Medications Ordered in UC Medications - No data to display  Initial Impression / Assessment and Plan / UC Course  I have reviewed the triage  vital signs and the nursing notes.  Pertinent labs & imaging results that were available during my care of the patient were reviewed by me and considered in my medical decision making (see chart for details).    Assessment negative for red flags or concerns.  COVID test pending.  Likely viral illness vs COVID.  May take promethazine DM as needed for cough but instructed to not take it prior to driving as it can cause drowsiness.  Discussed conservative symptom management as described in discharge instructions.  Encouraged fluids and rest.  Follow up with primary care as needed.  Final Clinical Impressions(s) / UC Diagnoses   Final diagnoses:  Viral URI with cough     Discharge Instructions      We will notify you if your COVID test is positive.  It will also show up in your MyChart.   You can take the Promethazine DM as needed for cough at night.  It can make you sleepy so do not take ir prior to driving.    You can take Tylenol and/or Ibuprofen as needed for fever reduction and pain relief.   For cough: honey 1/2 to 1 teaspoon (you can dilute the honey in water or another fluid).  You can also use guaifenesin and dextromethorphan for cough. You can use a humidifier for chest congestion and cough.  If you don't have a humidifier, you can sit in the bathroom with the hot shower running.     For sore throat: try warm salt water gargles, cepacol lozenges, throat spray, warm tea or water with lemon/honey, popsicles or ice, or OTC cold relief medicine for throat discomfort.    For congestion: take a daily anti-histamine like Zyrtec, Claritin, and a oral decongestant, such as pseudoephedrine.  You can also use Flonase 1-2 sprays in each nostril daily.    It is important to stay hydrated: drink plenty of fluids (water, gatorade/powerade/pedialyte, juices, or teas) to keep your throat moisturized and help further relieve irritation/discomfort.   Return or go to the Emergency Department if  symptoms worsen or do not improve in the next few days.      ED Prescriptions     Medication Sig Dispense Auth. Provider   promethazine-dextromethorphan (PROMETHAZINE-DM) 6.25-15 MG/5ML syrup Take 5 mLs by mouth 4 (four) times daily as needed for cough. 118 mL Pearson Forster, NP      PDMP not reviewed this encounter.   Pearson Forster, NP 11/22/20 1125

## 2020-11-23 ENCOUNTER — Telehealth (INDEPENDENT_AMBULATORY_CARE_PROVIDER_SITE_OTHER): Payer: Medicare Other | Admitting: Nurse Practitioner

## 2020-11-23 ENCOUNTER — Encounter: Payer: Self-pay | Admitting: Nurse Practitioner

## 2020-11-23 ENCOUNTER — Ambulatory Visit: Payer: Medicare Other | Admitting: Orthopaedic Surgery

## 2020-11-23 VITALS — BP 148/91 | HR 90 | Temp 97.6°F

## 2020-11-23 DIAGNOSIS — R059 Cough, unspecified: Secondary | ICD-10-CM

## 2020-11-23 DIAGNOSIS — U071 COVID-19: Secondary | ICD-10-CM | POA: Diagnosis not present

## 2020-11-23 MED ORDER — NIRMATRELVIR/RITONAVIR (PAXLOVID)TABLET: 3.0000 | ORAL_TABLET | Freq: Two times a day (BID) | ORAL | 0 refills | Status: AC

## 2020-11-23 MED ORDER — HYDROCODONE BIT-HOMATROP MBR 5-1.5 MG/5ML PO SOLN: 5.0000 mL | Freq: Four times a day (QID) | ORAL | 0 refills | Status: DC | PRN

## 2020-11-23 NOTE — Patient Instructions (Signed)
10 Things You Can Do to Manage Your COVID-19 Symptoms at Home If you have possible or confirmed COVID-19 Stay home except to get medical care. Monitor your symptoms carefully. If your symptoms get worse, call your healthcare provider immediately. Get rest and stay hydrated. If you have a medical appointment, call the healthcare provider ahead of time and tell them that you have or may have COVID-19. For medical emergencies, call 911 and notify the dispatch personnel that you have or may have COVID-19. Cover your cough and sneezes with a tissue or use the inside of your elbow. Wash your hands often with soap and water for at least 20 seconds or clean your hands with an alcohol-based hand sanitizer that contains at least 60% alcohol. As much as possible, stay in a specific room and away from other people in your home. Also, you should use a separate bathroom, if available. If you need to be around other people in or outside of the home, wear a mask. Avoid sharing personal items with other people in your household, like dishes, towels, and bedding. Clean all surfaces that are touched often, like counters, tabletops, and doorknobs. Use household cleaning sprays or wipes according to the label instructions. cdc.gov/coronavirus 11/06/2019 This information is not intended to replace advice given to you by your health care provider. Make sure you discuss any questions you have with your healthcare provider. Document Revised: 05/27/2020 Document Reviewed: 05/27/2020 Elsevier Patient Education  2022 Elsevier Inc.  

## 2020-11-23 NOTE — Progress Notes (Signed)
Virtual Visit via MyChart   This visit type was conducted due to national recommendations for restrictions regarding the COVID-19 Pandemic (e.g. social distancing) in an effort to limit this patient's exposure and mitigate transmission in our community.  Due to her co-morbid illnesses, this patient is at least at moderate risk for complications without adequate follow up.  This format is felt to be most appropriate for this patient at this time.  All issues noted in this document were discussed and addressed.  A limited physical exam was performed with this format.    This visit type was conducted due to national recommendations for restrictions regarding the COVID-19 Pandemic (e.g. social distancing) in an effort to limit this patient's exposure and mitigate transmission in our community.  Patients identity confirmed using two different identifiers.  This format is felt to be most appropriate for this patient at this time.  All issues noted in this document were discussed and addressed.  No physical exam was performed (except for noted visual exam findings with Video Visits).    Date:  11/23/2020   ID:  Ashley Lindsey, DOB Sep 18, 1960, MRN [id number]  Patient Location:  Home - spoke with Ashley Lindsey  Provider location:   Office    Chief Complaint:  positive for covid  History of Present Illness:    Ashley Lindsey is a 60 y.o. female who presents via video conferencing for a telehealth visit today.    The patient does have symptoms concerning for COVID-19 infection (fever, chills, cough, or new shortness of breath).   She started with symptoms started on 7/23 - chills, fever, sneezing, cough.   Reports her uncle had the 4 injection had cough and chills.  Went to urgent care yesterday due continuing to feel bad and her mother was tested and positive was given phenergan with DM continues to have cough. Still has muscle aches, blood pressure has been elevated.     Past Medical History:   Diagnosis Date   Abdominal cramps    Arthritis    Cancer ([place])    s/p radiation- last dose 6/12//has been off Tamoxifen 8/12   Chronic headaches    Costochondritis    Fatigue    Fibromyalgia 03/06/2013   Hypertension    Lymphedema    RIGHT ARM-////  STATES USE LEFT ARM FOR BP'S   Migraines    Muscle spasms of head and/or neck    following to the breast   Passed out    4th of july   Personal history of radiation therapy 2010   lt breast    S/P radiation therapy 08/19/07 - 10/10/07   Left Breast/5040 cGy/28 fractions with Boost for a toatl dose of 6300 dGy   S/P radiation therapy 08/14/10 -10/03/10   right breast   Sleep apnea    STOP BANG SCORE 5   Past Surgical History:  Procedure Laterality Date   ABDOMINAL HYSTERECTOMY     with left salpingooophorectomy   BREAST LUMPECTOMY  2009/2012   LEFT/RIGHT with lymph node dissection   KNEE ARTHROSCOPY     right   OOPHORECTOMY  2013     Current Meds  Medication Sig   HYDROcodone bit-homatropine (HYDROMET) 5-1.5 MG/5ML syrup Take 5 mLs by mouth every 6 (six) hours as needed for cough.   nirmatrelvir/ritonavir EUA (PAXLOVID) TABS Take 3 tablets by mouth 2 (two) times daily for 5 days. Patient GFR is 100 . Take nirmatrelvir (150 mg) two tablets twice daily for 5 days  and ritonavir (100 mg) one tablet twice daily for 5 days.     Allergies:   Aspirin and Penicillins   Social History   Tobacco Use   Smoking status: Former    Packs/day: 0.25    Years: 1.00    Pack years: 0.25    Types: Cigarettes   Smokeless tobacco: Never  Vaping Use   Vaping Use: Never used  Substance Use Topics   Alcohol use: Not Currently   Drug use: No     Family Hx: The patient's family history includes Breast cancer (age of onset: 41) in her cousin; Breast cancer (age of onset: 30) in her maternal aunt; Breast cancer (age of onset: 74) in her mother; Cancer (age of onset: 72) in her maternal aunt; Heart disease in her father; Prostate cancer in her  paternal uncle and paternal uncle; Prostate cancer (age of onset: 61) in her maternal uncle; Prostate cancer (age of onset: 32) in her maternal uncle; Prostate cancer (age of onset: 50) in her maternal grandfather.  ROS:   Please see the history of present illness.    Review of Systems  Constitutional:  Negative for chills, fever and malaise/fatigue.  HENT:  Positive for congestion and sore throat.   Respiratory:  Positive for cough. Negative for sputum production, shortness of breath and wheezing.   Gastrointestinal:  Negative for diarrhea and nausea.  Musculoskeletal:  Negative for myalgias.  Neurological:  Negative for dizziness and headaches.  Psychiatric/Behavioral:  Negative for depression.    All other systems reviewed and are negative.   Labs/Other Tests and Data Reviewed:    Recent Labs: 02/17/2020: Magnesium 2.0 10/05/2020: ALT 16; BUN 10; Creatinine 0.72; Hemoglobin 11.7; Platelet Count 258; Potassium 4.2; Sodium 139   Recent Lipid Panel Lab Results  Component Value Date/Time   CHOL 203 (H) 08/03/2020 12:05 PM   TRIG 259 (H) 08/03/2020 12:05 PM   HDL 62 08/03/2020 12:05 PM   CHOLHDL 3.3 08/03/2020 12:05 PM   LDLCALC 98 08/03/2020 12:05 PM    Wt Readings from Last 3 Encounters:  11/11/20 299 lb (135.6 kg)  10/05/20 296 lb 1.6 oz (134.3 kg)  09/28/20 292 lb (132.5 kg)     Exam:    Vital Signs:  BP (!) 148/91   Pulse 90   Temp 97.6 F (36.4 C) (Temporal)     Physical Exam Vitals reviewed.  Constitutional:      General: She is not in acute distress.    Appearance: Normal appearance. She is obese.  Pulmonary:     Effort: Pulmonary effort is normal. No respiratory distress.  Neurological:     General: No focal deficit present.     Mental Status: She is alert and oriented to person, place, and time.     Cranial Nerves: No cranial nerve deficit.     Motor: No weakness.    ASSESSMENT & PLAN:     1. COVID-19 She is having mild symptoms and I sent  paxlovid and discussed side effects of rebound symptoms She is aware if she has shortness of breath, chest pain or sudden onset leg pain to go to ER for evaluation.  She is to remain in isolation until Sunday 8/7 She is advised to get her covid booster in 90 days Discussed what term covid symptoms can consist   2. Cough She is to take cough syrup as needed and to avoid operating heavy machinery or driving.  - HYDROcodone bit-homatropine (HYDROMET) 5-1.5 MG/5ML syrup; Take 5 mLs  by mouth every 6 (six) hours as needed for cough.  Dispense: 120 mL; Refill: 0   COVID-19 Education: The signs and symptoms of COVID-19 were discussed with the patient and how to seek care for testing (follow up with PCP or arrange E-visit).  The importance of social distancing was discussed today.  Patient Risk:   After full review of this patients clinical status, I feel that they are at least moderate risk at this time.  Time:   Today, I have spent 15 minutes/ seconds with the patient with telehealth technology discussing above diagnoses.     Medication Adjustments/Labs and Tests Ordered: Current medicines are reviewed at length with the patient today.  Concerns regarding medicines are outlined above.   Tests Ordered: No orders of the defined types were placed in this encounter.   Medication Changes: Meds ordered this encounter  Medications   HYDROcodone bit-homatropine (HYDROMET) 5-1.5 MG/5ML syrup    Sig: Take 5 mLs by mouth every 6 (six) hours as needed for cough.    Dispense:  120 mL    Refill:  0   nirmatrelvir/ritonavir EUA (PAXLOVID) TABS    Sig: Take 3 tablets by mouth 2 (two) times daily for 5 days. Patient GFR is 100 . Take nirmatrelvir (150 mg) two tablets twice daily for 5 days and ritonavir (100 mg) one tablet twice daily for 5 days.    Dispense:  30 tablet    Refill:  0    Disposition:  Follow up prn  Signed, [name], FNP

## 2020-11-29 ENCOUNTER — Telehealth: Payer: Self-pay

## 2020-11-29 NOTE — Telephone Encounter (Signed)
Returned the pt's call and left the date of her next appt. 02/07/2021

## 2020-11-30 ENCOUNTER — Ambulatory Visit: Payer: Medicare Other | Admitting: Nurse Practitioner

## 2020-12-05 ENCOUNTER — Ambulatory Visit: Payer: Medicare Other

## 2020-12-05 ENCOUNTER — Other Ambulatory Visit: Payer: Self-pay

## 2020-12-05 DIAGNOSIS — R072 Precordial pain: Secondary | ICD-10-CM

## 2020-12-07 ENCOUNTER — Ambulatory Visit: Payer: Medicare Other | Admitting: Orthopaedic Surgery

## 2020-12-07 NOTE — Telephone Encounter (Signed)
From pt

## 2020-12-08 ENCOUNTER — Ambulatory Visit: Payer: Medicare Other | Admitting: Orthopaedic Surgery

## 2020-12-08 ENCOUNTER — Ambulatory Visit: Payer: Medicare Other

## 2020-12-08 ENCOUNTER — Other Ambulatory Visit: Payer: Self-pay

## 2020-12-08 DIAGNOSIS — R55 Syncope and collapse: Secondary | ICD-10-CM | POA: Diagnosis not present

## 2020-12-08 NOTE — Telephone Encounter (Signed)
We will review the results at the upcoming office visit.

## 2020-12-14 ENCOUNTER — Other Ambulatory Visit: Payer: Self-pay

## 2020-12-14 DIAGNOSIS — R911 Solitary pulmonary nodule: Secondary | ICD-10-CM

## 2020-12-15 ENCOUNTER — Inpatient Hospital Stay: Payer: Medicare Other

## 2020-12-15 ENCOUNTER — Ambulatory Visit: Payer: Medicare Other | Admitting: Cardiology

## 2020-12-15 ENCOUNTER — Other Ambulatory Visit: Payer: Self-pay

## 2020-12-15 ENCOUNTER — Encounter: Payer: Self-pay | Admitting: Cardiology

## 2020-12-15 VITALS — BP 149/91 | HR 93 | Temp 97.8°F | Resp 16 | Ht 62.0 in | Wt 301.2 lb

## 2020-12-15 DIAGNOSIS — R911 Solitary pulmonary nodule: Secondary | ICD-10-CM

## 2020-12-15 DIAGNOSIS — I429 Cardiomyopathy, unspecified: Secondary | ICD-10-CM | POA: Diagnosis not present

## 2020-12-15 DIAGNOSIS — G4733 Obstructive sleep apnea (adult) (pediatric): Secondary | ICD-10-CM

## 2020-12-15 DIAGNOSIS — I10 Essential (primary) hypertension: Secondary | ICD-10-CM

## 2020-12-15 DIAGNOSIS — R55 Syncope and collapse: Secondary | ICD-10-CM

## 2020-12-15 DIAGNOSIS — R0902 Hypoxemia: Secondary | ICD-10-CM

## 2020-12-15 DIAGNOSIS — R072 Precordial pain: Secondary | ICD-10-CM | POA: Diagnosis not present

## 2020-12-15 MED ORDER — SPIRONOLACTONE 25 MG PO TABS: 25.0000 mg | ORAL_TABLET | Freq: Every morning | ORAL | 0 refills | Status: DC

## 2020-12-15 MED ORDER — LOSARTAN POTASSIUM 25 MG PO TABS: 25.0000 mg | ORAL_TABLET | Freq: Every day | ORAL | 0 refills | Status: DC

## 2020-12-15 NOTE — Progress Notes (Signed)
Date:  12/15/2020   ID:  Ashley Lindsey, DOB 05/24/1960, MRN [id number]  PCP:  [name], FNP  Cardiologist: Rex Kras, DO, [facility]  (established care 11/11/2020)  Date: 12/15/20 Last Office Visit: 11/11/2020  Chief Complaint  Patient presents with   Follow-up    Syncope workup    Results     HPI  Ashley Lindsey is a 60 y.o. female who presents to the office with a chief complaint of " review the work-up of syncope." Patient's past medical history and cardiovascular risk factors include: Cardiomyopathy, history of COVID-19 infection, TIA, HTN, OSA (not on CPAP yet dx in June 2022), fibromyalgia, history of breast cancer bilaterally status post chemotherapy and radiation, former smoker, obesity due to excess calories.  She is referred to the office at the request of [name], FNP for evaluation of syncope.  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  She was taken to [facility] and underwent a stroke code work-up and then referred to cardiology for further evaluation and management.  In addition, at the last office visit patient was complaining of symptoms located substernally, lasting a few seconds, worse with effort related activities, resolving with rest.  She was recommended to undergo an ischemic work-up including an echocardiogram and stress test.  Since last office visit patient states that she has not had any syncopal events, no chest pain to suggest angina pectoris, shortness of breath has resolved.  Unfortunately, she had COVID-19 infection since last office visit.  And at the last office visit given her soft blood pressures hydrochlorothiazide was held.  She has an upcoming office visit in ENT as well for evaluation of vertigo.   ALLERGIES: Allergies  Allergen Reactions   Aspirin     unknown   Penicillins Hives and Swelling    Has patient had a PCN reaction causing immediate rash, facial/tongue/throat  swelling, SOB or lightheadedness with hypotension: Yes Has patient had a PCN reaction causing severe rash involving mucus membranes or skin necrosis: Yes Has patient had a PCN reaction that required hospitalization Yes Has patient had a PCN reaction occurring within the last 10 years: No If all of the above answers are "NO", then may proceed with Cephalosporin use.     MEDICATION LIST PRIOR TO VISIT: Current Meds  Medication Sig   acetaminophen (TYLENOL) 650 MG CR tablet Take 650 mg by mouth every 8 (eight) hours as needed for pain.   albuterol (VENTOLIN HFA) 108 (90 Base) MCG/ACT inhaler INHALE 1-2 PUFFS INTO THE LUNGS EVERY 6 (SIX) HOURS AS NEEDED FOR WHEEZING OR SHORTNESS OF BREATH.   aspirin-acetaminophen-caffeine (EXCEDRIN MIGRAINE) 250-250-65 MG tablet Take 1 tablet by mouth every 6 (six) hours as needed for headache.   Bisacodyl (DULCOLAX PO) Take 1 capsule by mouth daily as needed (constipation).    cholecalciferol 5000 units TABS Take 1 tablet (5,000 Units total) by mouth 2 (two) times daily. (Patient taking differently: Take 5,000 Units by mouth daily.)   cyclobenzaprine (FLEXERIL) 10 MG tablet TAKE 1 TABLET BY MOUTH EVERY DAY AS NEEDED   DEXILANT 60 MG capsule TAKE 1 CAPSULE BY MOUTH EVERY DAY   diphenhydrAMINE (BENADRYL) 25 MG tablet Take 1 tablet (25 mg total) by mouth every 6 (six) hours as needed for up to 20 doses (migraine). Take with reglan for migraine headache   ELDERBERRY PO Take 1 tablet by mouth daily.   fish oil-omega-3 fatty acids 1000 MG  capsule Take 1 g by mouth daily.   Flaxseed, Linseed, (FLAXSEED OIL MAX STR PO) Take 1 capsule by mouth daily.    gabapentin (NEURONTIN) 300 MG capsule TAKE 1 CAPSULE BY MOUTH THREE TIMES A DAY   Iron-FA-B Cmp-C-Biot-Probiotic (FUSION PLUS) CAPS Take 1 capsule by mouth daily.   losartan (COZAAR) 25 MG tablet Take 1 tablet (25 mg total) by mouth daily at 10 pm. Hold if systolic blood pressure (top number) less than 100 mmHg    meloxicam (MOBIC) 7.5 MG tablet TAKE 1 TABLET BY MOUTH EVERY DAY   Multiple Vitamins-Minerals (MULTIVITAMIN & MINERAL PO) Take 1 tablet by mouth daily.   omeprazole (PRILOSEC) 20 MG capsule Take 20 mg by mouth daily.   ondansetron (ZOFRAN) 4 MG tablet TAKE 1 TABLET BY MOUTH DAILY AS NEEDED FOR NAUSEA OR VOMITING   OVER THE COUNTER MEDICATION Take 125 mg by mouth daily. [place]ig[place]/Simethicone   propranolol (INDERAL) 20 MG tablet TAKE 1 TABLET BY MOUTH TWICE A DAY   spironolactone (ALDACTONE) 25 MG tablet Take 1 tablet (25 mg total) by mouth every morning.   SYMBICORT 80-4.5 MCG/ACT inhaler INHALE 2 PUFFS INTO THE LUNGS IN THE MORNING AND AT BEDTIME.   triamcinolone cream (KENALOG) 0.1 % Apply 1 application topically 2 (two) times daily. Apply for up to one week. (Patient taking differently: Apply 1 application topically 2 (two) times daily as needed (rash).)   Turmeric 500 MG CAPS Take by mouth.   venlafaxine XR (EFFEXOR-XR) 150 MG 24 hr capsule TAKE 1 CAPSULE BY MOUTH NIGHTLY WITH 75 MG CAPSULE (TOTAL OF 225 MG NIGHTLY)   venlafaxine XR (EFFEXOR-XR) 75 MG 24 hr capsule TAKE 1 CAPSULE BY MOUTH DAILY IN COMBINATION WITH XR 150 MG (TO EQUAL 225 MG TOTAL DAILY).   vitamin E 200 UNIT capsule Take 200 Units by mouth daily.     PAST MEDICAL HISTORY: Past Medical History:  Diagnosis Date   Abdominal cramps    Arthritis    Cancer ([place])    s/p radiation- last dose 6/12//has been off Tamoxifen 8/12   Chronic headaches    Costochondritis    Fatigue    Fibromyalgia 03/06/2013   History of COVID-19    Hypertension    Lymphedema    RIGHT ARM-////  STATES USE LEFT ARM FOR BP'S   Migraines    Muscle spasms of head and/or neck    following to the breast   Passed out    4th of july   Personal history of radiation therapy 2010   lt breast    S/P radiation therapy 08/19/07 - 10/10/07   Left Breast/5040 cGy/28 fractions with Boost for a toatl dose of 6300 dGy   S/P radiation therapy 08/14/10 -10/03/10    right breast   Sleep apnea    STOP BANG SCORE 5    PAST SURGICAL HISTORY: Past Surgical History:  Procedure Laterality Date   ABDOMINAL HYSTERECTOMY     with left salpingooophorectomy   BREAST LUMPECTOMY  2009/2012   LEFT/RIGHT with lymph node dissection   KNEE ARTHROSCOPY     right   OOPHORECTOMY  2013    FAMILY HISTORY: The patient family history includes Breast cancer (age of onset: 48) in her cousin; Breast cancer (age of onset: 55) in her maternal aunt; Breast cancer (age of onset: 56) in her mother; Cancer (age of onset: 42) in her maternal aunt; Heart disease in her father; Prostate cancer in her paternal uncle and paternal uncle; Prostate cancer (age of  onset: 16) in her maternal uncle; Prostate cancer (age of onset: 59) in her maternal uncle; Prostate cancer (age of onset: 59) in her maternal grandfather.  SOCIAL HISTORY:  The patient  reports that she has quit smoking. Her smoking use included cigarettes. She has a 0.25 pack-year smoking history. She has never used smokeless tobacco. She reports that she does not currently use alcohol. She reports that she does not use drugs.  REVIEW OF SYSTEMS: Review of Systems  Constitutional: Negative for chills and fever.  HENT:  Negative for hoarse voice and nosebleeds.   Eyes:  Negative for discharge, double vision and pain.  Cardiovascular:  Positive for leg swelling (chronic, better in morning). Negative for chest pain, claudication, dyspnea on exertion, near-syncope, orthopnea, palpitations, paroxysmal nocturnal dyspnea and syncope.  Respiratory:  Negative for hemoptysis and shortness of breath.   Musculoskeletal:  Negative for muscle cramps and myalgias.  Gastrointestinal:  Negative for abdominal pain, constipation, diarrhea, hematemesis, hematochezia, melena, nausea and vomiting.  Neurological:  Negative for dizziness and light-headedness.   PHYSICAL EXAM: Vitals with BMI 12/15/2020 11/23/2020 11/22/2020  Height '5\' 2"'$  - -  Weight  301 lbs 3 oz - -  BMI 99991111 - -  Systolic 123456 [id number]  Diastolic 91 91 89  Pulse 93 90 78    CONSTITUTIONAL: Appears older than stated age, hemodynamically stable well-developed and well-nourished. No acute distress.  SKIN: Skin is warm and dry. No rash noted. No cyanosis. No pallor. No jaundice HEAD: Normocephalic and atraumatic.  EYES: No scleral icterus MOUTH/THROAT: Moist oral membranes.  NECK: Unable to evaluate JVP due to short neck stature and adipose tissue, no thyromegaly noted. No carotid bruits  LYMPHATIC: No visible cervical adenopathy.  CHEST Normal respiratory effort. No intercostal retractions  LUNGS: Clear to auscultation bilaterally.  No stridor. No wheezes. No rales.  CARDIOVASCULAR: Regular rate and rhythm, positive S1-S2, no murmurs rubs or gallops appreciated ABDOMINAL: Obese, soft, nontender, nondistended, positive bowel sounds in all 4 quadrants no apparent ascites.  EXTREMITIES: Trace bilateral peripheral edema, palpable DP and PT pulses. HEMATOLOGIC: No significant bruising NEUROLOGIC: Oriented to person, place, and time. Nonfocal. Normal muscle tone.  PSYCHIATRIC: Normal mood and affect. Normal behavior. Cooperative  CARDIAC DATABASE: EKG: 11/11/2020: Normal sinus rhythm, 75 bpm, left atrial enlargement, without underlying ischemia or injury pattern.  Echo:  12/08/2020:  Endocardial definition is not well visualized. This may limit accuracy.  Consider alternate imaging studies, if clinically indicated.   Left  ventricle cavity is normal in size. Mild concentric hypertrophy of the left ventricle. Mild global hypokinesis. LVEF probably 45%. Doppler evidence of grade I (impaired) diastolic dysfunction, normal LAP.  Left atrial cavity is mildly dilated.  Mild (Grade I) mitral regurgitation.  Mild tricuspid regurgitation.  No evidence of pulmonary hypertension.  Stress Test: Lexiscan Tetrofosmin stress test 12/05/2020: Lexiscan nuclear stress test performed  using 1-day protocol. Mildly decreased tracer uptake in inferior myocardium, likely due to gut attenuation. Stress LVEF 62%. Low risk study.   CTA Head and Neck w/ and w/o contrast: 09/11/2020: 1. CT perfusion negative for acute infarct or ischemia 2. Negative for intracranial large vessel occlusion 3. No significant carotid or vertebral artery stenosis in the neck. No intracranial stenosis. 4. Code stroke imaging results were communicated on 09/11/2020 at 2:34 pm to provider Rory Percy via text page  CT Head w/o contrast: 09/11/2020 1. Negative CT head 2. ASPECTS is 10 3. Code stroke imaging results were communicated on 09/11/2020 at 1:59 pm to provider  Arora via text page  MRI brain with and without contrast: 10/04/2020 1. No specific cause for symptoms.  No acute or subacute infarct. 2. Mild white matter disease without specific demyelinating pattern.  Carotid artery duplex 12/08/2020:  The bifurcation, internal, external and common carotid arteries reveal no  evidence of significant stenosis, bilaterally. No significant plaque  burden noted bilaterally.  Right vertebral artery flow is not visualized. Left vertebral artery flow  is not visualized.  LABORATORY DATA: CBC Latest Ref Rng & Units 10/05/2020 09/11/2020 09/11/2020  WBC 4.0 - 10.5 K/uL 9.1 - 10.2  Hemoglobin 12.0 - 15.0 g/dL 11.7(L) 15.6(H) 13.3  Hematocrit 36.0 - 46.0 % 38.6 46.0 45.6  Platelets 150 - 400 K/uL 258 - 368    CMP Latest Ref Rng & Units 10/05/2020 09/11/2020 09/11/2020  Glucose 70 - 99 mg/dL 134(H) 107(H) 102(H)  BUN 8 - 23 mg/dL 10 6(L) 7(L)  Creatinine 0.44 - 1.00 mg/dL 0.72 0.60 0.70  Sodium 135 - 145 mmol/L 139 139 136  Potassium 3.5 - 5.1 mmol/L 4.2 3.7 3.9  Chloride 98 - 111 mmol/L 99 100 99  CO2 22 - 32 mmol/L 32 - 22  Calcium 8.9 - 10.3 mg/dL 9.8 - 9.4  Total Protein 6.5 - 8.1 g/dL 7.4 - 8.1  Total Bilirubin 0.3 - 1.2 mg/dL <0.2(L) - 0.5  Alkaline Phos 38 - 126 U/L 80 - 60  AST 15 - 41 U/L 14(L) -  25  ALT 0 - 44 U/L 16 - 18    Lipid Panel     Component Value Date/Time   CHOL 203 (H) 08/03/2020 1205   TRIG 259 (H) 08/03/2020 1205   HDL 62 08/03/2020 1205   CHOLHDL 3.3 08/03/2020 1205   LDLCALC 98 08/03/2020 1205   LABVLDL 43 (H) 08/03/2020 1205    No components found for: NTPROBNP No results for input(s): PROBNP in the last 8760 hours. No results for input(s): TSH in the last 8760 hours.  BMP Recent Labs    02/03/20 1243 02/03/20 1243 02/17/20 1129 08/03/20 1205 09/11/20 1349 09/11/20 1353 10/05/20 1141  NA 143   < > 138 140 136 139 139  K 4.9  --  3.7 4.6 3.9 3.7 4.2  CL 102  --  95* 100 99 100 99  CO2 29  --  33* 26 22  --  32  GLUCOSE 96   < > 103* 105* 102* 107* 134*  BUN 8   < > 13 14 7* 6* 10  CREATININE 0.67  --  0.62 0.66 0.70 0.60 0.72  CALCIUM 9.2  --  9.1 9.1 9.4  --  9.8  GFRNONAA 96  --  >60  --  >60  --  >60  GFRAA 110  --   --   --   --   --   --    < > = values in this interval not displayed.    HEMOGLOBIN A1C No results found for: HGBA1C, MPG   IMPRESSION:    ICD-10-CM   1. Cardiomyopathy, unspecified type ([place])  I42.9 losartan (COZAAR) 25 MG tablet    Basic metabolic panel    Magnesium    spironolactone (ALDACTONE) 25 MG tablet    2. Syncope and collapse  R55     3. Pulmonary nodule  R91.1     4. OSA (obstructive sleep apnea)  G47.33     5. Benign hypertension  I10     6. Precordial pain  R07.2  RECOMMENDATIONS: LAJUANA DERR is a 60 y.o. female whose past medical history and cardiac risk factors include: Cardiomyopathy, history of COVID-19 infection, TIA, HTN, OSA (not on CPAP yet dx in June 2022), fibromyalgia, history of breast cancer bilaterally status post chemotherapy and radiation, former smoker, obesity due to excess calories.  Cardiomyopathy, unspecified type ([place]) During her work-up of syncope she underwent an echocardiogram which noted mildly reduced LVEF. Clinically not in congestive heart  failure. Nuclear stress test overall low risk. Most likely secondary to history of breast cancer requiring chemotherapy and radiation. Would like to uptitrate GDMT. Start losartan 25 mg p.o. daily Discontinue hydrochlorothiazide and will start spironolactone 25 mg p.o. daily Blood work in 1 week to evaluate kidney function and electrolytes Will continue to monitor.  Syncope and collapse Index event May 2022. No reoccurrence of syncope. Most likely secondary to vasovagal etiology. Orthostatic vital signs have remained negative in the past. CT PE protocol was negative for pulmonary embolism. Echocardiogram, stress test, results reviewed Patient contracted COVID-19 infection and therefore was not on able to undergo telemetry.  Will reschedule. ENT evaluation forthcoming as she was having worsening of symptoms with turning her head side to side suggestive of BPPV. Has establish care with neurology as well.  Pulmonary nodule CTA PE study 11/11/2020 LLL ~8m.  Former smoker, last cig 7 years ago.  Awaiting pulmonary apppt.   OSA (obstructive sleep apnea) Diagnosed in June 2022..Marland Kitchen  Awaiting a CPAP machine.  Benign hypertension Office blood pressures are currently not at goal.  Most likely secondary to holding HCTZ We will start losartan hydrochlorothiazide as discussed above. Low-salt diet recommended  Precordial pain Nuclear stress test overall a low risk study. Denies any chest pain or angina pectoris. Monitor for now.  FINAL MEDICATION LIST END OF ENCOUNTER: Meds ordered this encounter  Medications   losartan (COZAAR) 25 MG tablet    Sig: Take 1 tablet (25 mg total) by mouth daily at 10 pm. Hold if systolic blood pressure (top number) less than 100 mmHg    Dispense:  90 tablet    Refill:  0   spironolactone (ALDACTONE) 25 MG tablet    Sig: Take 1 tablet (25 mg total) by mouth every morning.    Dispense:  90 tablet    Refill:  0     Medications Discontinued During This  Encounter  Medication Reason   HYDROcodone bit-homatropine (HYDROMET) 5-1.5 MG/5ML syrup Error     Current Outpatient Medications:    acetaminophen (TYLENOL) 650 MG CR tablet, Take 650 mg by mouth every 8 (eight) hours as needed for pain., Disp: , Rfl:    albuterol (VENTOLIN HFA) 108 (90 Base) MCG/ACT inhaler, INHALE 1-2 PUFFS INTO THE LUNGS EVERY 6 (SIX) HOURS AS NEEDED FOR WHEEZING OR SHORTNESS OF BREATH., Disp: 6.7 each, Rfl: 1   aspirin-acetaminophen-caffeine (EXCEDRIN MIGRAINE) 250-250-65 MG tablet, Take 1 tablet by mouth every 6 (six) hours as needed for headache., Disp: , Rfl:    Bisacodyl (DULCOLAX PO), Take 1 capsule by mouth daily as needed (constipation). , Disp: , Rfl:    cholecalciferol 5000 units TABS, Take 1 tablet (5,000 Units total) by mouth 2 (two) times daily. (Patient taking differently: Take 5,000 Units by mouth daily.), Disp: , Rfl:    cyclobenzaprine (FLEXERIL) 10 MG tablet, TAKE 1 TABLET BY MOUTH EVERY DAY AS NEEDED, Disp: 90 tablet, Rfl: 2   DEXILANT 60 MG capsule, TAKE 1 CAPSULE BY MOUTH EVERY DAY, Disp: 90 capsule, Rfl: 1  diphenhydrAMINE (BENADRYL) 25 MG tablet, Take 1 tablet (25 mg total) by mouth every 6 (six) hours as needed for up to 20 doses (migraine). Take with reglan for migraine headache, Disp: 20 tablet, Rfl: 0   ELDERBERRY PO, Take 1 tablet by mouth daily., Disp: , Rfl:    fish oil-omega-3 fatty acids 1000 MG capsule, Take 1 g by mouth daily., Disp: , Rfl:    Flaxseed, Linseed, (FLAXSEED OIL MAX STR PO), Take 1 capsule by mouth daily. , Disp: , Rfl:    gabapentin (NEURONTIN) 300 MG capsule, TAKE 1 CAPSULE BY MOUTH THREE TIMES A DAY, Disp: 90 capsule, Rfl: 3   Iron-FA-B Cmp-C-Biot-Probiotic (FUSION PLUS) CAPS, Take 1 capsule by mouth daily., Disp: 30 capsule, Rfl: 3   losartan (COZAAR) 25 MG tablet, Take 1 tablet (25 mg total) by mouth daily at 10 pm. Hold if systolic blood pressure (top number) less than 100 mmHg, Disp: 90 tablet, Rfl: 0   meloxicam (MOBIC)  7.5 MG tablet, TAKE 1 TABLET BY MOUTH EVERY DAY, Disp: 90 tablet, Rfl: 0   Multiple Vitamins-Minerals (MULTIVITAMIN & MINERAL PO), Take 1 tablet by mouth daily., Disp: , Rfl:    omeprazole (PRILOSEC) 20 MG capsule, Take 20 mg by mouth daily., Disp: , Rfl:    ondansetron (ZOFRAN) 4 MG tablet, TAKE 1 TABLET BY MOUTH DAILY AS NEEDED FOR NAUSEA OR VOMITING, Disp: 30 tablet, Rfl: 1   OVER THE COUNTER MEDICATION, Take 125 mg by mouth daily. [place]/Simethicone, Disp: , Rfl:    propranolol (INDERAL) 20 MG tablet, TAKE 1 TABLET BY MOUTH TWICE A DAY, Disp: 180 tablet, Rfl: 0   spironolactone (ALDACTONE) 25 MG tablet, Take 1 tablet (25 mg total) by mouth every morning., Disp: 90 tablet, Rfl: 0   SYMBICORT 80-4.5 MCG/ACT inhaler, INHALE 2 PUFFS INTO THE LUNGS IN THE MORNING AND AT BEDTIME., Disp: 30.6 each, Rfl: 1   triamcinolone cream (KENALOG) 0.1 %, Apply 1 application topically 2 (two) times daily. Apply for up to one week. (Patient taking differently: Apply 1 application topically 2 (two) times daily as needed (rash).), Disp: 30 g, Rfl: 0   Turmeric 500 MG CAPS, Take by mouth., Disp: , Rfl:    venlafaxine XR (EFFEXOR-XR) 150 MG 24 hr capsule, TAKE 1 CAPSULE BY MOUTH NIGHTLY WITH 75 MG CAPSULE (TOTAL OF 225 MG NIGHTLY), Disp: 90 capsule, Rfl: 0   venlafaxine XR (EFFEXOR-XR) 75 MG 24 hr capsule, TAKE 1 CAPSULE BY MOUTH DAILY IN COMBINATION WITH XR 150 MG (TO EQUAL 225 MG TOTAL DAILY)., Disp: 90 capsule, Rfl: 0   vitamin E 200 UNIT capsule, Take 200 Units by mouth daily., Disp: , Rfl:   Orders Placed This Encounter  Procedures   Basic metabolic panel   Magnesium    There are no Patient Instructions on file for this visit.   --Continue cardiac medications as reconciled in final medication list. --Return in about 3 months (around 03/17/2021) for Follow up cardiomyopathy. . Or sooner if needed. --Continue follow-up with your primary care physician regarding the management of your other chronic comorbid  conditions.  Patient's questions and concerns were addressed to her satisfaction. She voices understanding of the instructions provided during this encounter.   This note was created using a voice recognition software as a result there may be grammatical errors inadvertently enclosed that do not reflect the nature of this encounter. Every attempt is made to correct such errors.  Rex Kras, [place], [facility]  Pager: [phone] Office: [phone]

## 2020-12-20 ENCOUNTER — Other Ambulatory Visit: Payer: Self-pay

## 2020-12-20 ENCOUNTER — Ambulatory Visit (INDEPENDENT_AMBULATORY_CARE_PROVIDER_SITE_OTHER): Payer: Medicare Other | Admitting: Orthopaedic Surgery

## 2020-12-20 ENCOUNTER — Encounter: Payer: Self-pay | Admitting: Orthopaedic Surgery

## 2020-12-20 VITALS — Ht 62.0 in | Wt 301.0 lb

## 2020-12-20 DIAGNOSIS — M1712 Unilateral primary osteoarthritis, left knee: Secondary | ICD-10-CM

## 2020-12-20 DIAGNOSIS — I429 Cardiomyopathy, unspecified: Secondary | ICD-10-CM | POA: Diagnosis not present

## 2020-12-20 DIAGNOSIS — M170 Bilateral primary osteoarthritis of knee: Secondary | ICD-10-CM

## 2020-12-20 MED ORDER — METHYLPREDNISOLONE ACETATE 40 MG/ML IJ SUSP
80.0000 mg | INTRAMUSCULAR | Status: AC | PRN
  Administered 2020-12-20: 80 mg via INTRA_ARTICULAR

## 2020-12-20 MED ORDER — BUPIVACAINE HCL 0.25 % IJ SOLN
2.0000 mL | INTRAMUSCULAR | Status: AC | PRN
  Administered 2020-12-20: 2 mL via INTRA_ARTICULAR

## 2020-12-20 MED ORDER — LIDOCAINE HCL 1 % IJ SOLN
2.0000 mL | INTRAMUSCULAR | Status: AC | PRN
  Administered 2020-12-20: 2 mL

## 2020-12-20 NOTE — Progress Notes (Signed)
Office Visit Note   Patient: Ashley Lindsey           Date of Birth: 1960/12/26           MRN: [id number] Visit Date: 12/20/2020              Requested by: [name], [address],  [place] [id number] PCP: [name], FNP   Assessment & Plan: Visit Diagnoses:  1. Bilateral primary osteoarthritis of knee     Plan: Recurrent symptoms of osteoarthritis left knee.  Will reinject with cortisone.  We will plan to see her back in a week and evaluate low back pain that started after a "mini stroke" within the past month.  Pain is localized to her back without referred discomfort in the lower extremities.  Had films of her back in June of this year that she reveals some degenerative changes.  She had an MRI scan of the lumbar spine in 2015 demonstrating some mild degenerative changes  Follow-Up Instructions: Return in about 1 week (around 12/27/2020).   Orders:  No orders of the defined types were placed in this encounter.  No orders of the defined types were placed in this encounter.     Procedures: Large Joint Inj: L knee on 12/20/2020 3:05 PM Indications: pain and diagnostic evaluation Details: 25 G 1.5 in needle, anteromedial approach  Arthrogram: No  Medications: 2 mL lidocaine 1 %; 80 mg methylPREDNISolone acetate 40 MG/ML; 2 mL bupivacaine 0.25 % Procedure, treatment alternatives, risks and benefits explained, specific risks discussed. Consent was given by the patient. Patient was prepped and draped in the usual sterile fashion.      Clinical Data: No additional findings.   Subjective: Chief Complaint  Patient presents with   Right Knee - Pain  Patient presents today for right knee pain. She saw Dr.Yates on 09/21/2020 and received a cortisone injection. She received her last left knee injection in November of last year. She states that her left knee hurts the worst. She is wanting to have it injected today. She also wants to come back in a  week and have her right knee injected before going on vacation. She still has complaints of lower back pain. She was last seen for this in June.   HPI  Review of Systems   Objective: Vital Signs: Ht '5\' 2"'$  (1.575 m)   Wt (!) 301 lb (136.5 kg)   BMI 55.05 kg/m   Physical Exam Constitutional:      Appearance: She is well-developed.  Pulmonary:     Effort: Pulmonary effort is normal.  Skin:    General: Skin is warm and dry.  Neurological:     Mental Status: She is alert and oriented to person, place, and time.  Psychiatric:        Behavior: Behavior normal.    Ortho Exam comfortable sitting.  Pain mostly along the medial compartment of the left knee.  The knee was not hot red warm or swollen.  No effusion.  Full extension of flexed over 100 degrees without instability.  Great leg raise negative  Specialty Comments:  No specialty comments available.  Imaging: No results found.   PMFS History: Patient Active Problem List   Diagnosis Date Noted   Bilateral primary osteoarthritis of knee 02/09/2020   Chronic intermittent hypoxia with obstructive sleep apnea 08/24/2019   Chronic intractable headache 07/14/2019   Morning headache 07/14/2019   Loud snoring 07/14/2019   Super obesity 07/14/2019  Sleeps in sitting position due to orthopnea 07/14/2019   Chest pain due to GERD 07/14/2019   Nocturia more than twice per night 07/14/2019   Unilateral primary osteoarthritis, right knee 05/26/2019   Chronic migraine without aura without status migrainosus, not intractable 05/13/2019   Class 3 severe obesity due to excess calories without serious comorbidity with body mass index (BMI) of 40.0 to 44.9 in adult ([place]) 08/05/2018   Depression 08/05/2018   Chronic nonintractable headache 08/05/2018   Elevated blood-pressure reading without diagnosis of hypertension 06/06/2018   Chronic pain of right ankle 04/24/2018   Breast cancer of lower-outer quadrant of right female breast ([place])  12/02/2014   Bronchospasm 09/21/2014   Dyspnea 09/21/2014   Chronic pain of right knee 09/21/2014   Fibromyalgia 03/06/2013   Dense breasts 03/06/2013   Mastalgia 03/06/2013   Back pain, lumbosacral 03/06/2013   Lymphedema of arm 03/06/2013   Atypical chest pain 01/06/2013   Breast cancer of upper-inner quadrant of left female breast ([place]) 09/08/2012   Family history of malignant neoplasm of breast 09/08/2012   S/P radiation therapy    History of breast cancer in female 08/13/2011   Past Medical History:  Diagnosis Date   Abdominal cramps    Arthritis    Cancer ([place])    s/p radiation- last dose 6/12//has been off Tamoxifen 8/12   Chronic headaches    Costochondritis    Fatigue    Fibromyalgia 03/06/2013   History of COVID-19    Hypertension    Lymphedema    RIGHT ARM-////  STATES USE LEFT ARM FOR BP'S   Migraines    Muscle spasms of head and/or neck    following to the breast   Passed out    4th of july   Personal history of radiation therapy 2010   lt breast    S/P radiation therapy 08/19/07 - 10/10/07   Left Breast/5040 cGy/28 fractions with Boost for a toatl dose of 6300 dGy   S/P radiation therapy 08/14/10 -10/03/10   right breast   Sleep apnea    STOP BANG SCORE 5    Family History  Problem Relation Age of Onset   Breast cancer Mother 61   Heart disease Father    Cancer Maternal Aunt 63       d. from "female cancer" possibly cervical   Breast cancer Maternal Aunt 60   Prostate cancer Maternal Uncle 78   Prostate cancer Maternal Uncle 80   Prostate cancer Paternal Uncle    Prostate cancer Paternal Uncle    Prostate cancer Maternal Grandfather 43   Breast cancer Cousin 54       mat 1st cousin    Past Surgical History:  Procedure Laterality Date   ABDOMINAL HYSTERECTOMY     with left salpingooophorectomy   BREAST LUMPECTOMY  2009/2012   LEFT/RIGHT with lymph node dissection   KNEE ARTHROSCOPY     right   OOPHORECTOMY  2013   Social History    Occupational History   Occupation: disability  Tobacco Use   Smoking status: Former    Packs/day: 0.25    Years: 1.00    Pack years: 0.25    Types: Cigarettes   Smokeless tobacco: Never  Vaping Use   Vaping Use: Never used  Substance and Sexual Activity   Alcohol use: Not Currently   Drug use: No   Sexual activity: Not Currently    Birth control/protection: Surgical

## 2020-12-21 ENCOUNTER — Inpatient Hospital Stay: Payer: Medicare Other

## 2020-12-22 ENCOUNTER — Other Ambulatory Visit: Payer: Self-pay

## 2020-12-22 ENCOUNTER — Inpatient Hospital Stay: Payer: Medicare Other

## 2020-12-22 DIAGNOSIS — I429 Cardiomyopathy, unspecified: Secondary | ICD-10-CM

## 2020-12-22 DIAGNOSIS — R55 Syncope and collapse: Secondary | ICD-10-CM | POA: Diagnosis not present

## 2020-12-25 NOTE — Progress Notes (Signed)
External Labs: Collected: 12/20/2020 Creatinine 0.79 mg/dL. eGFR: >60 mL/min per 1.73 m Sodium 141, potassium 4.4, chloride 98, bicarb 33, BUN 13 Magnesium 2.0  Outside labs were performed 1 week after starting losartan notes normal kidney function and electrolytes.  Continue current medical therapy

## 2020-12-26 ENCOUNTER — Other Ambulatory Visit: Payer: Self-pay | Admitting: Nurse Practitioner

## 2020-12-26 DIAGNOSIS — R519 Headache, unspecified: Secondary | ICD-10-CM

## 2020-12-26 DIAGNOSIS — Z6841 Body Mass Index (BMI) 40.0 and over, adult: Secondary | ICD-10-CM

## 2020-12-26 DIAGNOSIS — F32A Depression, unspecified: Secondary | ICD-10-CM

## 2020-12-27 ENCOUNTER — Ambulatory Visit: Payer: Medicare Other | Admitting: Orthopaedic Surgery

## 2020-12-27 NOTE — Progress Notes (Signed)
Called and spoke with patient regarding her recent lab results. Patient understands to continue medical therapy.

## 2020-12-28 ENCOUNTER — Other Ambulatory Visit: Payer: Self-pay | Admitting: Hematology and Oncology

## 2020-12-29 ENCOUNTER — Ambulatory Visit (INDEPENDENT_AMBULATORY_CARE_PROVIDER_SITE_OTHER): Payer: Medicare Other | Admitting: Orthopaedic Surgery

## 2020-12-29 ENCOUNTER — Encounter: Payer: Self-pay | Admitting: Orthopaedic Surgery

## 2020-12-29 ENCOUNTER — Other Ambulatory Visit: Payer: Self-pay

## 2020-12-29 DIAGNOSIS — M545 Low back pain, unspecified: Secondary | ICD-10-CM

## 2020-12-29 DIAGNOSIS — M1711 Unilateral primary osteoarthritis, right knee: Secondary | ICD-10-CM | POA: Diagnosis not present

## 2020-12-29 MED ORDER — METHYLPREDNISOLONE ACETATE 40 MG/ML IJ SUSP
80.0000 mg | INTRAMUSCULAR | Status: AC | PRN
  Administered 2020-12-29: 80 mg via INTRA_ARTICULAR

## 2020-12-29 MED ORDER — BUPIVACAINE HCL 0.25 % IJ SOLN
2.0000 mL | INTRAMUSCULAR | Status: AC | PRN
  Administered 2020-12-29: 2 mL via INTRA_ARTICULAR

## 2020-12-29 MED ORDER — LIDOCAINE HCL 1 % IJ SOLN
2.0000 mL | INTRAMUSCULAR | Status: AC | PRN
  Administered 2020-12-29: 2 mL

## 2020-12-29 NOTE — Progress Notes (Signed)
Office Visit Note   Patient: Ashley Lindsey           Date of Birth: 16-Apr-1961           MRN: [id number] Visit Date: 12/29/2020              Requested by: Ashley Lindsey, [address] [place],  [place] [id number] PCP: Ashley Lindsey:  1. Acute midline low back pain, unspecified whether sciatica present   2. Unilateral primary osteoarthritis, right knee     Plan: Ashley Lindsey is experiencing recurrent symptoms of osteoarthritis right knee and I will inject with cortisone.  She also relates a 35-monthhistory of low back pain with radiation into both of her lower extremities.  Pain seems to start in the lumbar spine rating to both buttocks and both lower extremities.  She is tried Tylenol and other over-the-counter medicines without much relief.  She has had a history of low back pain in the past.  She had an MRI scan of the lumbar spine in 2015 demonstrating slight facet arthritis but no degenerative disc disease or stenosis.  She is in the midst of a weight loss program and wants to be more active and finds that her back is really causing her to have more trouble.  Neurologically appears to be intact but I do think it is worth obtaining a baseline MRI scan to see if she might be a candidate for facet injections or an epidural steroid or physical therapy.  She did have films of the lumbar spine several months ago that revealed anterior listhesis of L5 on S1 and facet sclerosis at the lower lumbar level.  Follow-Up Instructions: Return After MRI scan lumbar spine.   Orders:  Orders Placed This Encounter  Procedures   Large Joint Inj: R knee   MR Lumbar Spine w/o contrast   No orders of the defined types were placed in this encounter.     Procedures: Large Joint Inj: R knee on 12/29/2020 5:39 PM Indications: pain and diagnostic evaluation Details: 25 G 1.5 in needle, anteromedial approach  Arthrogram: No  Medications: 2 mL  lidocaine 1 %; 80 mg methylPREDNISolone acetate 40 MG/ML; 2 mL bupivacaine 0.25 % Procedure, treatment alternatives, risks and benefits explained, specific risks discussed. Consent was given by the patient. Immediately prior to procedure a time out was called to verify the correct patient, procedure, equipment, support staff and site/side marked as required. Patient was prepped and draped in the usual sterile fashion.      Clinical Data: No additional findings.   Subjective: Chief Complaint  Patient presents with   Lower Back - Pain   Right Knee - Pain  Patient presents today for a three month follow up on her lower back. She said that it radiates down both legs. She also has complaints of cramping in her rectum. She is taking Tylenol for pain. She also wants to have her right knee injected today.   HPI  Review of Systems   Objective: Vital Signs: There were no vitals taken for this visit.  Physical Exam Constitutional:      Appearance: She is well-developed.  Eyes:     Pupils: Pupils are equal, round, and reactive to light.  Pulmonary:     Effort: Pulmonary effort is normal.  Skin:    General: Skin is warm and dry.  Neurological:     Mental Status: She is alert and oriented  to person, place, and time.  Psychiatric:        Behavior: Behavior normal.    Ortho Exam right knee with diffuse mild to moderate medial joint pain.  Large knees.  Difficult to ascertain whether there is a an effusion.  No instability.  Full extension and flexion at least 95 degrees.  Has had prior arthroscopy with well-healed portals.  No calf pain.  Straight leg raise negative.  Painless range of motion both hips.  No percussible tenderness lumbar spine  Specialty Comments:  No specialty comments available.  Imaging: No results found.   PMFS History: Patient Active Problem List   Diagnosis Date Noted   Bilateral primary osteoarthritis of knee 02/09/2020   Chronic intermittent hypoxia with  obstructive sleep apnea 08/24/2019   Chronic intractable headache 07/14/2019   Morning headache 07/14/2019   Loud snoring 07/14/2019   Super obesity 07/14/2019   Sleeps in sitting position due to orthopnea 07/14/2019   Chest pain due to GERD 07/14/2019   Nocturia more than twice per night 07/14/2019   Unilateral primary osteoarthritis, right knee 05/26/2019   Chronic migraine without aura without status migrainosus, not intractable 05/13/2019   Class 3 severe obesity due to excess calories without serious comorbidity with body mass index (BMI) of 40.0 to 44.9 in adult ([place]) 08/05/2018   Depression 08/05/2018   Chronic nonintractable headache 08/05/2018   Elevated blood-pressure reading without diagnosis of hypertension 06/06/2018   Chronic pain of right ankle 04/24/2018   Breast cancer of lower-outer quadrant of right female breast ([place]) 12/02/2014   Bronchospasm 09/21/2014   Dyspnea 09/21/2014   Chronic pain of right knee 09/21/2014   Fibromyalgia 03/06/2013   Dense breasts 03/06/2013   Mastalgia 03/06/2013   Back pain, lumbosacral 03/06/2013   Lymphedema of arm 03/06/2013   Atypical chest pain 01/06/2013   Breast cancer of upper-inner quadrant of left female breast ([place]) 09/08/2012   Family history of malignant neoplasm of breast 09/08/2012   S/P radiation therapy    History of breast cancer in female 08/13/2011   Past Medical History:  Diagnosis Date   Abdominal cramps    Arthritis    Cancer ([place])    s/p radiation- last dose 6/12//has been off Tamoxifen 8/12   Chronic headaches    Costochondritis    Fatigue    Fibromyalgia 03/06/2013   History of COVID-19    Hypertension    Lymphedema    RIGHT ARM-////  STATES USE LEFT ARM FOR BP'S   Migraines    Muscle spasms of head and/or neck    following to the breast   Passed out    4th of july   Personal history of radiation therapy 2010   lt breast    S/P radiation therapy 08/19/07 - 10/10/07   Left Breast/5040 cGy/28  fractions with Boost for a toatl dose of 6300 dGy   S/P radiation therapy 08/14/10 -10/03/10   right breast   Sleep apnea    STOP BANG SCORE 5    Family History  Problem Relation Age of Onset   Breast cancer Mother 88   Heart disease Father    Cancer Maternal Aunt 63       d. from "female cancer" possibly cervical   Breast cancer Maternal Aunt 60   Prostate cancer Maternal Uncle 78   Prostate cancer Maternal Uncle 80   Prostate cancer Paternal Uncle    Prostate cancer Paternal Uncle    Prostate cancer Maternal Grandfather 65  Breast cancer Cousin 68       mat 1st cousin    Past Surgical History:  Procedure Laterality Date   ABDOMINAL HYSTERECTOMY     with left salpingooophorectomy   BREAST LUMPECTOMY  2009/2012   LEFT/RIGHT with lymph node dissection   KNEE ARTHROSCOPY     right   OOPHORECTOMY  2013   Social History   Occupational History   Occupation: disability  Tobacco Use   Smoking status: Former    Packs/day: 0.25    Years: 1.00    Pack years: 0.25    Types: Cigarettes   Smokeless tobacco: Never  Vaping Use   Vaping Use: Never used  Substance and Sexual Activity   Alcohol use: Not Currently   Drug use: No   Sexual activity: Not Currently    Birth control/protection: Surgical

## 2020-12-30 ENCOUNTER — Ambulatory Visit: Payer: Medicare Other | Admitting: Cardiology

## 2021-01-04 NOTE — Telephone Encounter (Signed)
From patient.

## 2021-01-05 ENCOUNTER — Other Ambulatory Visit: Payer: Self-pay | Admitting: Nurse Practitioner

## 2021-01-06 NOTE — Telephone Encounter (Signed)
Per [place] law, she needs to not drive until she has been symptoms / syncope free for 6 months. We had discussed this during prior office visit.

## 2021-01-11 ENCOUNTER — Ambulatory Visit (INDEPENDENT_AMBULATORY_CARE_PROVIDER_SITE_OTHER): Payer: Medicare Other

## 2021-01-11 ENCOUNTER — Ambulatory Visit: Payer: Medicare Other

## 2021-01-11 VITALS — Ht 62.0 in | Wt 284.0 lb

## 2021-01-11 DIAGNOSIS — Z0000 Encounter for general adult medical examination without abnormal findings: Secondary | ICD-10-CM | POA: Diagnosis not present

## 2021-01-11 NOTE — Progress Notes (Signed)
I connected with Ashley Lindsey today by telephone and verified that I am speaking with the correct person using two identifiers. Location patient: home Location provider: work Persons participating in the virtual visit: Chrishelle, Zito LPN.   I discussed the limitations, risks, security and privacy concerns of performing an evaluation and management service by telephone and the availability of in person appointments. I also discussed with the patient that there may be a patient responsible charge related to this service. The patient expressed understanding and verbally consented to this telephonic visit.    Interactive audio and video telecommunications were attempted between this provider and patient, however failed, due to patient having technical difficulties OR patient did not have access to video capability.  We continued and completed visit with audio only.     Vital signs may be patient reported or missing.  Subjective:   Ashley Lindsey is a 60 y.o. female who presents for Medicare Annual (Subsequent) preventive examination.  Review of Systems     Cardiac Risk Factors include: advanced age (>44men, >4 women);hypertension;obesity (BMI >30kg/m2);sedentary lifestyle     Objective:    Today's Vitals   01/11/21 1356 01/11/21 1357  Weight: 284 lb (128.8 kg)   Height: 5\' 2"  (1.575 m)   PainSc:  7    Body mass index is 51.94 kg/m.  Advanced Directives 01/11/2021 09/11/2020 03/02/2020 12/02/2019 10/09/2018 05/06/2018 03/03/2018  Does Patient Have a Medical Advance Directive? Yes No Yes Yes No No No  Type of Paramedic of [place];Living will - [place] will - - -  Does patient want to make changes to medical advance directive? - - No - Patient declined - - - -  Copy of [place] in Chart? No - copy requested - No - copy requested - - - -  Would patient like information on creating a medical advance  directive? - No - Patient declined - - - No - Patient declined -  Pre-existing out of facility DNR order (yellow form or pink MOST form) - - - - - - -    Current Medications (verified) Outpatient Encounter Medications as of 01/11/2021  Medication Sig   acetaminophen (TYLENOL) 650 MG CR tablet Take 650 mg by mouth every 8 (eight) hours as needed for pain.   albuterol (VENTOLIN HFA) 108 (90 Base) MCG/ACT inhaler INHALE 1-2 PUFFS INTO THE LUNGS EVERY 6 (SIX) HOURS AS NEEDED FOR WHEEZING OR SHORTNESS OF BREATH.   aspirin-acetaminophen-caffeine (EXCEDRIN MIGRAINE) 250-250-65 MG tablet Take 1 tablet by mouth every 6 (six) hours as needed for headache.   Bisacodyl (DULCOLAX PO) Take 1 capsule by mouth daily as needed (constipation).    cholecalciferol 5000 units TABS Take 1 tablet (5,000 Units total) by mouth 2 (two) times daily. (Patient taking differently: Take 5,000 Units by mouth daily.)   cyclobenzaprine (FLEXERIL) 10 MG tablet TAKE 1 TABLET BY MOUTH EVERY DAY AS NEEDED   DEXILANT 60 MG capsule TAKE 1 CAPSULE BY MOUTH EVERY DAY   diphenhydrAMINE (BENADRYL) 25 MG tablet Take 1 tablet (25 mg total) by mouth every 6 (six) hours as needed for up to 20 doses (migraine). Take with reglan for migraine headache   ELDERBERRY PO Take 1 tablet by mouth daily.   fish oil-omega-3 fatty acids 1000 MG capsule Take 1 g by mouth daily.   Flaxseed, Linseed, (FLAXSEED OIL MAX STR PO) Take 1 capsule by mouth daily.    gabapentin (NEURONTIN) 300 MG capsule  TAKE 1 CAPSULE BY MOUTH THREE TIMES A DAY   Iron-FA-B Cmp-C-Biot-Probiotic (FUSION PLUS) CAPS Take 1 capsule by mouth daily.   losartan (COZAAR) 25 MG tablet Take 1 tablet (25 mg total) by mouth daily at 10 pm. Hold if systolic blood pressure (top number) less than 100 mmHg   meloxicam (MOBIC) 7.5 MG tablet TAKE 1 TABLET BY MOUTH EVERY DAY   Multiple Vitamins-Minerals (MULTIVITAMIN & MINERAL PO) Take 1 tablet by mouth daily.   omeprazole (PRILOSEC) 20 MG capsule  Take 20 mg by mouth daily.   ondansetron (ZOFRAN) 4 MG tablet TAKE 1 TABLET BY MOUTH DAILY AS NEEDED FOR NAUSEA OR VOMITING   OVER THE COUNTER MEDICATION Take 125 mg by mouth daily. [place]/Simethicone   propranolol (INDERAL) 20 MG tablet TAKE 1 TABLET BY MOUTH TWICE A DAY   spironolactone (ALDACTONE) 25 MG tablet Take 1 tablet (25 mg total) by mouth every morning.   SYMBICORT 80-4.5 MCG/ACT inhaler INHALE 2 PUFFS INTO THE LUNGS IN THE MORNING AND AT BEDTIME.   triamcinolone cream (KENALOG) 0.1 % Apply 1 application topically 2 (two) times daily. Apply for up to one week. (Patient taking differently: Apply 1 application topically 2 (two) times daily as needed (rash).)   Turmeric 500 MG CAPS Take by mouth.   venlafaxine XR (EFFEXOR-XR) 150 MG 24 hr capsule TAKE 1 CAPSULE BY MOUTH NIGHTLY WITH 75 MG CAPSULE (TOTAL OF 225 MG NIGHTLY)   venlafaxine XR (EFFEXOR-XR) 75 MG 24 hr capsule TAKE 1 CAPSULE BY MOUTH DAILY IN COMBINATION WITH XR 150 MG (TO EQUAL 225 MG TOTAL DAILY).   vitamin E 200 UNIT capsule Take 200 Units by mouth daily.   No facility-administered encounter medications on file as of 01/11/2021.    Allergies (verified) Aspirin and Penicillins   History: Past Medical History:  Diagnosis Date   Abdominal cramps    Arthritis    Cancer ([place])    s/p radiation- last dose 6/12//has been off Tamoxifen 8/12   Chronic headaches    Costochondritis    Fatigue    Fibromyalgia 03/06/2013   History of COVID-19    Hypertension    Lymphedema    RIGHT ARM-////  STATES USE LEFT ARM FOR BP'S   Migraines    Muscle spasms of head and/or neck    following to the breast   Passed out    4th of july   Personal history of radiation therapy 2010   lt breast    S/P radiation therapy 08/19/07 - 10/10/07   Left Breast/5040 cGy/28 fractions with Boost for a toatl dose of 6300 dGy   S/P radiation therapy 08/14/10 -10/03/10   right breast   Sleep apnea    STOP BANG SCORE 5   Past Surgical History:   Procedure Laterality Date   ABDOMINAL HYSTERECTOMY     with left salpingooophorectomy   BREAST LUMPECTOMY  2009/2012   LEFT/RIGHT with lymph node dissection   KNEE ARTHROSCOPY     right   OOPHORECTOMY  2013   Family History  Problem Relation Age of Onset   Breast cancer Mother 46   Heart disease Father    Cancer Maternal Aunt 63       d. from "female cancer" possibly cervical   Breast cancer Maternal Aunt 60   Prostate cancer Maternal Uncle 78   Prostate cancer Maternal Uncle 80   Prostate cancer Paternal Uncle    Prostate cancer Paternal Uncle    Prostate cancer Maternal Grandfather 61  Breast cancer Cousin 49       mat 1st cousin   Social History   Socioeconomic History   Marital status: Divorced    Spouse name: Not on file   Number of children: Not on file   Years of education: Not on file   Highest education level: Not on file  Occupational History   Occupation: disability  Tobacco Use   Smoking status: Former    Packs/day: 0.25    Years: 1.00    Pack years: 0.25    Types: Cigarettes   Smokeless tobacco: Never  Vaping Use   Vaping Use: Never used  Substance and Sexual Activity   Alcohol use: Not Currently   Drug use: No   Sexual activity: Not Currently    Birth control/protection: Surgical  Other Topics Concern   Not on file  Social History Narrative   Right handed    Coffee daily, sometimes Ginger ale    Social Determinants of Health   Financial Resource Strain: Low Risk    Difficulty of Paying Living Expenses: Not hard at all  Food Insecurity: No Food Insecurity   Worried About Charity fundraiser in the Last Year: Never true   [place] in the Last Year: Never true  Transportation Needs: No Transportation Needs   Lack of Transportation (Medical): No   Lack of Transportation (Non-Medical): No  Physical Activity: Inactive   Days of Exercise per Week: 0 days   Minutes of Exercise per Session: 0 min  Stress: No Stress Concern Present    Feeling of Stress : Not at all  Social Connections: Not on file    Tobacco Counseling Counseling given: Not Answered   Clinical Intake:  Pre-visit preparation completed: Yes  Pain : 0-10 Pain Score: 7  Pain Type: Acute pain Pain Location: Head Pain Descriptors / Indicators: Aching Pain Onset: Today Pain Frequency: Intermittent     Nutritional Status: BMI > 30  Obese Nutritional Risks: Nausea/ vomitting/ diarrhea (some nausea) Diabetes: No  How often do you need to have someone help you when you read instructions, pamphlets, or other written materials from your doctor or pharmacy?: 1 - Never  Diabetic? no  Interpreter Needed?: No  Information entered by :: NAllen LPN   Activities of Daily Living In your present state of health, do you have any difficulty performing the following activities: 01/11/2021  Hearing? Y  Comment decreased hearing in left ear  Vision? N  Difficulty concentrating or making decisions? N  Walking or climbing stairs? Y  Dressing or bathing? N  Doing errands, shopping? N  Preparing Food and eating ? N  Using the Toilet? N  In the past six months, have you accidently leaked urine? N  Do you have problems with loss of bowel control? N  Managing your Medications? N  Managing your Finances? N  Housekeeping or managing your Housekeeping? N  Some recent data might be hidden    Patient Care Team: [name], FNP as PCP - General (General Practice)  Indicate any recent Medical Services you may have received from other than [facility] providers in the past year (date may be approximate).     Assessment:   This is a routine wellness examination for Ashley Lindsey.  Hearing/Vision screen No results found.  Dietary issues and exercise activities discussed: Current Exercise Habits: The patient does not participate in regular exercise at present   Goals Addressed  This Visit's Progress    Patient Stated       01/11/2021, wants to lose  weight       Depression Screen PHQ 2/9 Scores 01/11/2021 12/02/2019 09/01/2019 06/15/2019 06/15/2019 04/07/2019 01/15/2019  PHQ - 2 Score 0 2 2 0 0 0 0  PHQ- 9 Score - 10 13 0 - - 1    Fall Risk Fall Risk  01/11/2021 12/02/2019 09/01/2019 06/15/2019 04/07/2019  Falls in the past year? 0 1 1 1  0  Comment - missed a step - - -  Number falls in past yr: - 0 1 0 -  Injury with Fall? - 0 0 0 -  Risk for fall due to : Medication side effect Medication side effect - - -  Follow up Falls evaluation completed;Education provided;Falls prevention discussed Falls evaluation completed;Education provided;Falls prevention discussed - - -    FALL RISK PREVENTION PERTAINING TO THE HOME:  Any stairs in or around the home? Yes  If so, are there any without handrails? No  Home free of loose throw rugs in walkways, pet beds, electrical cords, etc? Yes  Adequate lighting in your home to reduce risk of falls? Yes   ASSISTIVE DEVICES UTILIZED TO PREVENT FALLS:  Life alert? No  Use of a cane, walker or w/c? No  Grab bars in the bathroom? Yes  Shower chair or bench in shower? No  Elevated toilet seat or a handicapped toilet? No   TIMED UP AND GO:  Was the test performed? No .      Cognitive Function:     6CIT Screen 01/11/2021 12/02/2019 10/09/2018  What Year? 0 points 0 points 0 points  What month? 0 points 0 points 0 points  What time? 0 points 0 points 0 points  Count back from 20 4 points 0 points 0 points  Months in reverse 0 points 0 points 2 points  Repeat phrase 10 points 4 points 0 points  Total Score 14 4 2     Immunizations Immunization History  Administered Date(s) Administered   Influenza Inj Mdck Quad Pf 05/20/2017   Influenza Split 04/21/2012   Influenza,inj,Quad PF,6+ Mos 05/19/2013, 05/16/2015, 01/26/2016, 02/27/2018, 01/20/2019, 02/03/2020   Influenza-Unspecified 02/24/2018   PFIZER(Purple Top)SARS-COV-2 Vaccination 07/23/2019, 08/17/2019, 04/04/2020   Tdap 11/05/2012     TDAP status: Up to date  Flu Vaccine status: Due, Education has been provided regarding the importance of this vaccine. Advised may receive this vaccine at local pharmacy or Health Dept. Aware to provide a copy of the vaccination record if obtained from local pharmacy or Health Dept. Verbalized acceptance and understanding.  Pneumococcal vaccine status: Up to date  Covid-19 vaccine status: Completed vaccines  Qualifies for Shingles Vaccine? Yes   Zostavax completed No   Shingrix Completed?: No.    Education has been provided regarding the importance of this vaccine. Patient has been advised to call insurance company to determine out of pocket expense if they have not yet received this vaccine. Advised may also receive vaccine at local pharmacy or Health Dept. Verbalized acceptance and understanding.  Screening Tests Health Maintenance  Topic Date Due   Zoster Vaccines- Shingrix (1 of 2) Never done   COVID-19 Vaccine (4 - Booster for Pfizer series) 06/27/2020   INFLUENZA VACCINE  11/21/2020   MAMMOGRAM  06/01/2022   TETANUS/TDAP  11/06/2022   COLONOSCOPY (Pts 45-43yrs Insurance coverage will need to be confirmed)  05/22/2025   Hepatitis C Screening  Completed   HIV Screening  Completed  HPV VACCINES  Aged Out   PAP SMEAR-Modifier  Discontinued    Health Maintenance  Health Maintenance Due  Topic Date Due   Zoster Vaccines- Shingrix (1 of 2) Never done   COVID-19 Vaccine (4 - Booster for Pfizer series) 06/27/2020   INFLUENZA VACCINE  11/21/2020    Colorectal cancer screening: Type of screening: Colonoscopy. Completed 05/23/2015. Repeat every 10 years  Mammogram status: Completed 06/01/2020. Repeat every year  Bone Density status: n/a  Lung Cancer Screening: (Low Dose CT Chest recommended if Age 17-80 years, 30 pack-year currently smoking OR have quit w/in 15years.) does qualify.   Lung Cancer Screening Referral: no  Additional Screening:  Hepatitis C Screening: does  qualify; Completed 10/09/2018  Vision Screening: Recommended annual ophthalmology exams for early detection of glaucoma and other disorders of the eye. Is the patient up to date with their annual eye exam?  Yes  Who is the provider or what is the name of the office in which the patient attends annual eye exams? [facility] If pt is not established with a provider, would they like to be referred to a provider to establish care? No .   Dental Screening: Recommended annual dental exams for proper oral hygiene  Community Resource Referral / Chronic Care Management: CRR required this visit?  No   CCM required this visit?  No      Plan:     I have personally reviewed and noted the following in the patient's chart:   Medical and social history Use of alcohol, tobacco or illicit drugs  Current medications and supplements including opioid prescriptions.  Functional ability and status Nutritional status Physical activity Advanced directives List of other physicians Hospitalizations, surgeries, and ER visits in previous 12 months Vitals Screenings to include cognitive, depression, and falls Referrals and appointments  In addition, I have reviewed and discussed with patient certain preventive protocols, quality metrics, and best practice recommendations. A written personalized care plan for preventive services as well as general preventive health recommendations were provided to patient.     Kellie Simmering, LPN   [id number]   Nurse Notes:

## 2021-01-11 NOTE — Patient Instructions (Signed)
Ashley Lindsey , Thank you for taking time to come for your Medicare Wellness Visit. I appreciate your ongoing commitment to your health goals. Please review the following plan we discussed and let me know if I can assist you in the future.   Screening recommendations/referrals: Colonoscopy: completed 05/23/2015 Mammogram: completed 06/01/2020 Bone Density: n/a Recommended yearly ophthalmology/optometry visit for glaucoma screening and checkup Recommended yearly dental visit for hygiene and checkup  Vaccinations: Influenza vaccine: due Pneumococcal vaccine: due Tdap vaccine: completed 11/05/2012, due 11/06/2022 Shingles vaccine: discussed  Covid-19:  04/04/2020, 08/17/2019, 07/23/2019  Advanced directives: Please bring a copy of your POA (Power of Attorney) and/or Living Will to your next appointment.   Conditions/risks identified: none  Next appointment: Follow up in one year for your annual wellness visit.   Preventive Care 40-64 Years, Female Preventive care refers to lifestyle choices and visits with your health care provider that can promote health and wellness. What does preventive care include? A yearly physical exam. This is also called an annual well check. Dental exams once or twice a year. Routine eye exams. Ask your health care provider how often you should have your eyes checked. Personal lifestyle choices, including: Daily care of your teeth and gums. Regular physical activity. Eating a healthy diet. Avoiding tobacco and drug use. Limiting alcohol use. Practicing safe sex. Taking low-dose aspirin daily starting at age 32. Taking vitamin and mineral supplements as recommended by your health care provider. What happens during an annual well check? The services and screenings done by your health care provider during your annual well check will depend on your age, overall health, lifestyle risk factors, and family history of disease. Counseling  Your health care provider may  ask you questions about your: Alcohol use. Tobacco use. Drug use. Emotional well-being. Home and relationship well-being. Sexual activity. Eating habits. Work and work Statistician. Method of birth control. Menstrual cycle. Pregnancy history. Screening  You may have the following tests or measurements: Height, weight, and BMI. Blood pressure. Lipid and cholesterol levels. These may be checked every 5 years, or more frequently if you are over 59 years old. Skin check. Lung cancer screening. You may have this screening every year starting at age 46 if you have a 30-pack-year history of smoking and currently smoke or have quit within the past 15 years. Fecal occult blood test (FOBT) of the stool. You may have this test every year starting at age 68. Flexible sigmoidoscopy or colonoscopy. You may have a sigmoidoscopy every 5 years or a colonoscopy every 10 years starting at age 35. Hepatitis C blood test. Hepatitis B blood test. Sexually transmitted disease (STD) testing. Diabetes screening. This is done by checking your blood sugar (glucose) after you have not eaten for a while (fasting). You may have this done every 1-3 years. Mammogram. This may be done every 1-2 years. Talk to your health care provider about when you should start having regular mammograms. This may depend on whether you have a family history of breast cancer. BRCA-related cancer screening. This may be done if you have a family history of breast, ovarian, tubal, or peritoneal cancers. Pelvic exam and Pap test. This may be done every 3 years starting at age 52. Starting at age 66, this may be done every 5 years if you have a Pap test in combination with an HPV test. Bone density scan. This is done to screen for osteoporosis. You may have this scan if you are at high risk for osteoporosis. Discuss your  test results, treatment options, and if necessary, the need for more tests with your health care provider. Vaccines  Your  health care provider may recommend certain vaccines, such as: Influenza vaccine. This is recommended every year. Tetanus, diphtheria, and acellular pertussis (Tdap, Td) vaccine. You may need a Td booster every 10 years. Zoster vaccine. You may need this after age 24. Pneumococcal 13-valent conjugate (PCV13) vaccine. You may need this if you have certain conditions and were not previously vaccinated. Pneumococcal polysaccharide (PPSV23) vaccine. You may need one or two doses if you smoke cigarettes or if you have certain conditions. Talk to your health care provider about which screenings and vaccines you need and how often you need them. This information is not intended to replace advice given to you by your health care provider. Make sure you discuss any questions you have with your health care provider. Document Released: 05/06/2015 Document Revised: 12/28/2015 Document Reviewed: 02/08/2015 Elsevier Interactive Patient Education  2017 [employer] Prevention in the Home Falls can cause injuries. They can happen to people of all ages. There are many things you can do to make your home safe and to help prevent falls. What can I do on the outside of my home? Regularly fix the edges of walkways and driveways and fix any cracks. Remove anything that might make you trip as you walk through a door, such as a raised step or threshold. Trim any bushes or trees on the path to your home. Use bright outdoor lighting. Clear any walking paths of anything that might make someone trip, such as rocks or tools. Regularly check to see if handrails are loose or broken. Make sure that both sides of any steps have handrails. Any raised decks and porches should have guardrails on the edges. Have any leaves, snow, or ice cleared regularly. Use sand or salt on walking paths during winter. Clean up any spills in your garage right away. This includes oil or grease spills. What can I do in the  bathroom? Use night lights. Install grab bars by the toilet and in the tub and shower. Do not use towel bars as grab bars. Use non-skid mats or decals in the tub or shower. If you need to sit down in the shower, use a plastic, non-slip stool. Keep the floor dry. Clean up any water that spills on the floor as soon as it happens. Remove soap buildup in the tub or shower regularly. Attach bath mats securely with double-sided non-slip rug tape. Do not have throw rugs and other things on the floor that can make you trip. What can I do in the bedroom? Use night lights. Make sure that you have a light by your bed that is easy to reach. Do not use any sheets or blankets that are too big for your bed. They should not hang down onto the floor. Have a firm chair that has side arms. You can use this for support while you get dressed. Do not have throw rugs and other things on the floor that can make you trip. What can I do in the kitchen? Clean up any spills right away. Avoid walking on wet floors. Keep items that you use a lot in easy-to-reach places. If you need to reach something above you, use a strong step stool that has a grab bar. Keep electrical cords out of the way. Do not use floor polish or wax that makes floors slippery. If you must use wax, use non-skid  floor wax. Do not have throw rugs and other things on the floor that can make you trip. What can I do with my stairs? Do not leave any items on the stairs. Make sure that there are handrails on both sides of the stairs and use them. Fix handrails that are broken or loose. Make sure that handrails are as long as the stairways. Check any carpeting to make sure that it is firmly attached to the stairs. Fix any carpet that is loose or worn. Avoid having throw rugs at the top or bottom of the stairs. If you do have throw rugs, attach them to the floor with carpet tape. Make sure that you have a light switch at the top of the stairs and the  bottom of the stairs. If you do not have them, ask someone to add them for you. What else can I do to help prevent falls? Wear shoes that: Do not have high heels. Have rubber bottoms. Are comfortable and fit you well. Are closed at the toe. Do not wear sandals. If you use a stepladder: Make sure that it is fully opened. Do not climb a closed stepladder. Make sure that both sides of the stepladder are locked into place. Ask someone to hold it for you, if possible. Clearly mark and make sure that you can see: Any grab bars or handrails. First and last steps. Where the edge of each step is. Use tools that help you move around (mobility aids) if they are needed. These include: Canes. Walkers. Scooters. Crutches. Turn on the lights when you go into a dark area. Replace any light bulbs as soon as they burn out. Set up your furniture so you have a clear path. Avoid moving your furniture around. If any of your floors are uneven, fix them. If there are any pets around you, be aware of where they are. Review your medicines with your doctor. Some medicines can make you feel dizzy. This can increase your chance of falling. Ask your doctor what other things that you can do to help prevent falls. This information is not intended to replace advice given to you by your health care provider. Make sure you discuss any questions you have with your health care provider. Document Released: 02/03/2009 Document Revised: 09/15/2015 Document Reviewed: 05/14/2014 Elsevier Interactive Patient Education  2017 [employer].

## 2021-01-22 DIAGNOSIS — R55 Syncope and collapse: Secondary | ICD-10-CM | POA: Diagnosis not present

## 2021-01-23 ENCOUNTER — Other Ambulatory Visit: Payer: Medicare Other

## 2021-01-25 ENCOUNTER — Telehealth: Payer: Self-pay | Admitting: Cardiology

## 2021-01-25 NOTE — Telephone Encounter (Signed)
Patient says a medication she was put on at her last appointment was making her feel dizzy. She will send a message on MyChart to explain more about this.

## 2021-02-01 ENCOUNTER — Ambulatory Visit
Admission: RE | Admit: 2021-02-01 | Discharge: 2021-02-01 | Disposition: A | Discharge status: Home / Self Care | Payer: Medicare Other | Source: Ambulatory Visit | Attending: Orthopaedic Surgery | Admitting: Orthopaedic Surgery

## 2021-02-01 DIAGNOSIS — M48061 Spinal stenosis, lumbar region without neurogenic claudication: Secondary | ICD-10-CM | POA: Diagnosis not present

## 2021-02-01 DIAGNOSIS — M545 Low back pain, unspecified: Secondary | ICD-10-CM

## 2021-02-02 ENCOUNTER — Encounter
Payer: Medicare Other | Attending: Physical Medicine and Rehabilitation | Admitting: Physical Medicine and Rehabilitation

## 2021-02-02 ENCOUNTER — Encounter: Payer: Self-pay | Admitting: Physical Medicine and Rehabilitation

## 2021-02-02 ENCOUNTER — Other Ambulatory Visit: Payer: Self-pay

## 2021-02-02 VITALS — BP 108/74 | HR 86 | Temp 98.1°F | Ht 62.0 in | Wt 285.0 lb

## 2021-02-02 DIAGNOSIS — H903 Sensorineural hearing loss, bilateral: Secondary | ICD-10-CM | POA: Diagnosis not present

## 2021-02-02 DIAGNOSIS — M797 Fibromyalgia: Secondary | ICD-10-CM

## 2021-02-02 MED ORDER — GABAPENTIN 300 MG PO CAPS: ORAL_CAPSULE | ORAL | 3 refills | Status: DC

## 2021-02-02 NOTE — Patient Instructions (Signed)
Foods that can help with pain: 1) Ginger (especially studied for arthritis)- reduce leukotriene production to decrease inflammation 2) Blueberries- high in phytonutrients that decrease inflammation 3) Salmon- marine omega-3s reduce joint swelling and pain 4) Pumpkin seeds- reduce inflammation 5) dark chocolate- reduces inflammation 6) turmeric- reduces inflammation 7) tart cherries - reduce pain and stiffness 8) extra virgin olive oil - its compound olecanthal helps to block prostaglandins  9) chili peppers- can be eaten or applied topically via capsaicin 10) mint- helpful for headache, muscle aches, joint pain, and itching 11) garlic- reduces inflammation  Link to further information on diet for chronic pain: [email or web address]   Foods to help with weight loss:  -Consider Roobois tea daily.  -Discussed the benefits of intermittent fasting. -Discussed foods that can assist in weight loss: 1) leafy greens- high in fiber and nutrients 2) dark chocolate- improves metabolism (if prefer sweetened, best to sweeten with honey instead of sugar).  3) cruciferous vegetables- high in fiber and protein 4) full fat yogurt: high in healthy fat, protein, calcium, and probiotics 5) apples- high in a variety of phytochemicals 6) nuts- high in fiber and protein that increase feelings of fullness 7) grapefruit: rich in nutrients, antioxidants, and fiber (not to be taken with anticoagulation) 8) beans- high in protein and fiber 9) salmon- has high quality protein and healthy fats 10) green tea- rich in polyphenols 11) eggs- rich in choline and vitamin D 12) tuna- high protein, boosts metabolism 13) avocado- decreases visceral abdominal fat 14) chicken (pasture raised): high in protein and iron 15) blueberries- reduce abdominal fat and cholesterol 16) whole grains- decreases calories retained during digestion, speeds  metabolism 17) chia seeds- curb appetite 18) chilies- increases fat metabolism  -Discussed supplements that can be used:  1) Metatrim 400mg  BID 30 minutes before breakfast and dinner  2) Sphaeranthus indicus and Garcinia mangostana (combinations of these and #1 can be found in capsicum and zychrome  3) green coffee bean extract 400mg  twice per day or Irvingia (african mango) 150 to 300mg  twice per day.   Depression: saffron

## 2021-02-02 NOTE — Progress Notes (Signed)
Subjective:    Patient ID: Ashley Lindsey, female    DOB: January 20, 1961, 60 y.o.   MRN: [id number]  HPI Mrs. Coke is a 60 year female with a history of breast cancer, fibromyalgia, lymphedema, right knee osteoarthritis.  She has a history of breast cancer, lymphedema, and her oncologist told her that the resection of her lymph nodes contributed to her fibromyalgia.  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  She is 304 lbs and is trying to lose weight.   She finds the Gabapentin helpful.   She has had a lot of deaths in her family recently  She moved her daughter to [place] recently  She had COVID and was very sick.  She feels that now she knows with her fibromyalgia it interwines with her arthritis  Her family does not understand how bad she hurts.   She has been taking Tylenol-Arthritis which she says has saved her life. She also takes Meloxicam 7.5mg  as needed. She needs to have bilateral knee surgery but would like to lose more weight first. She is afraid if she does the surgery now she will gain more weight. She has tried Lyrica in the past and it aggravated her migraines.   She plans to go with daughter to the beach today with her daughter.  She does not want to go on a diet pill. She has tried intermittent fasting and does not eat until 6'o'clock. She has been tolerating this very well. She has been trying to avoid steroids as they make her hungry. She has a steroid injection by her orthopedist today into her knee.   She has been wearing good tennis shoes with memory foam.   She has started back in swimming in the pool.   Pain Inventory Average Pain 9 Pain Right Now 5 My pain is constant, tingling, and aching  In the last 24 hours, has pain interfered with the following? General activity 5 Relation with others 5 Enjoyment of life 5 What TIME of day is your pain at its worst? morning, daytime, night Sleep  (in general) Fair  Pain is worse with: walking, sitting, standing, and some activites Pain improves with: rest, therapy/exercise, and medication Relief from Meds: 5  Mobility walk with assistance use a cane how many minutes can you walk? 10 ability to climb steps?  no do you drive?  yes  Function disabled: date disabled .  Neuro/Psych bladder control problems weakness numbness tingling trouble walking dizziness anxiety  Prior Studies Any changes since last visit?  no  Physicians involved in your care Any changes since last visit?  no   Family History  Problem Relation Age of Onset   Breast cancer Mother 82   Heart disease Father    Cancer Maternal Aunt 63       d. from "female cancer" possibly cervical   Breast cancer Maternal Aunt 60   Prostate cancer Maternal Uncle 78   Prostate cancer Maternal Uncle 80   Prostate cancer Paternal Uncle    Prostate cancer Paternal Uncle    Prostate cancer Maternal Grandfather 93   Breast cancer Cousin 64       mat 1st cousin   Social History   Socioeconomic History   Marital status: Divorced    Spouse name: Not on file   Number of children: Not on file   Years of education: Not on file   Highest education level:  Not on file  Occupational History   Occupation: disability  Tobacco Use   Smoking status: Former    Packs/day: 0.25    Years: 1.00    Pack years: 0.25    Types: Cigarettes   Smokeless tobacco: Never  Vaping Use   Vaping Use: Never used  Substance and Sexual Activity   Alcohol use: Not Currently   Drug use: No   Sexual activity: Not Currently    Birth control/protection: Surgical  Other Topics Concern   Not on file  Social History Narrative   Right handed    Coffee daily, sometimes Ginger ale    Social Determinants of Health   Financial Resource Strain: Low Risk    Difficulty of Paying Living Expenses: Not hard at all  Food Insecurity: No Food Insecurity   Worried About Charity fundraiser in  the Last Year: Never true   Ran Out of Food in the Last Year: Never true  Transportation Needs: No Transportation Needs   Lack of Transportation (Medical): No   Lack of Transportation (Non-Medical): No  Physical Activity: Inactive   Days of Exercise per Week: 0 days   Minutes of Exercise per Session: 0 min  Stress: No Stress Concern Present   Feeling of Stress : Not at all  Social Connections: Not on file   Past Surgical History:  Procedure Laterality Date   ABDOMINAL HYSTERECTOMY     with left salpingooophorectomy   BREAST LUMPECTOMY  2009/2012   LEFT/RIGHT with lymph node dissection   KNEE ARTHROSCOPY     right   OOPHORECTOMY  2013   Past Medical History:  Diagnosis Date   Abdominal cramps    Arthritis    Cancer ([place])    s/p radiation- last dose 6/12//has been off Tamoxifen 8/12   Chronic headaches    Costochondritis    Fatigue    Fibromyalgia 03/06/2013   History of COVID-19    Hypertension    Lymphedema    RIGHT ARM-////  STATES USE LEFT ARM FOR BP'S   Migraines    Muscle spasms of head and/or neck    following to the breast   Passed out    4th of july   Personal history of radiation therapy 2010   lt breast    S/P radiation therapy 08/19/07 - 10/10/07   Left Breast/5040 cGy/28 fractions with Boost for a toatl dose of 6300 dGy   S/P radiation therapy 08/14/10 -10/03/10   right breast   Sleep apnea    STOP BANG SCORE 5   BP 108/74   Pulse 86   Temp 98.1 F (36.7 C)   Ht 5\' 2"  (1.575 m)   Wt 285 lb (129.3 kg)   SpO2 96%   BMI 52.13 kg/m   Opioid Risk Score:   Fall Risk Score:  `1  Depression screen PHQ 2/9  Depression screen [facility] 2/9 02/02/2021 01/11/2021 12/02/2019 09/01/2019 06/15/2019 06/15/2019 04/07/2019  Decreased Interest 1 0 1 2 0 0 0  Down, Depressed, Hopeless 1 0 1 0 0 0 0  PHQ - 2 Score 2 0 2 2 0 0 0  Altered sleeping - - 1 2 0 - -  Tired, decreased energy - - 3 2 0 - -  Change in appetite - - 1 3 0 - -  Feeling bad or failure about yourself   - - 0 0 0 - -  Trouble concentrating - - 3 2 0 - -  Moving slowly or fidgety/restless - -  0 2 0 - -  Suicidal thoughts - - 0 0 0 - -  PHQ-9 Score - - 10 13 0 - -  Difficult doing work/chores - - Somewhat difficult Somewhat difficult Not difficult at all - -  Some recent data might be hidden    Review of Systems  Constitutional:  Positive for diaphoresis and unexpected weight change.  Respiratory:  Positive for apnea, cough, shortness of breath and wheezing.   Gastrointestinal:  Positive for nausea.  Endocrine:       High blood sugar  Musculoskeletal:  Positive for arthralgias, back pain, gait problem, myalgias, neck pain and neck stiffness.  Skin:  Negative for rash.  Allergic/Immunologic: Negative.   Neurological:  Positive for dizziness, weakness and numbness.       Tingling  Psychiatric/Behavioral:  The patient is nervous/anxious.   All other systems reviewed and are negative.      Objective:   Physical Exam Gen: no distress, normal appearing, morbid obesity BMI 52.13, weight 286 lbs HEENT: oral mucosa pink and moist, NCAT Cardio: Reg rate Chest: normal effort, normal rate of breathing Abd: soft, non-distended Ext: no edema Skin: intact Neuro: Aox3 Musculoskeletal: Psych: pleasant, normal affect, talkative    Assessment & Plan:  Mrs. Leonhart is a 60 year female with a history of breast cancer, fibromyalgia, lymphedema, right knee osteoarthritis.  Fibromyalgia: -Continue use of therapeutic bed. -Has great support system with daughter, grandchildren, her mother and uncle who she takes care of.  -Lyrica has caused migraines. She is currently on Gabapentin, prescribed by her oncologist.  -Continue Gabapentin -provided with pain relief journal -Discussed current symptoms of pain and history of pain.  -Discussed benefits of exercise in reducing pain. -Discussed following foods that may reduce pain: 1) Ginger (especially studied for arthritis)- reduce leukotriene  production to decrease inflammation 2) Blueberries- high in phytonutrients that decrease inflammation 3) Salmon- marine omega-3s reduce joint swelling and pain 4) Pumpkin seeds- reduce inflammation 5) dark chocolate- reduces inflammation 6) turmeric- reduces inflammation 7) tart cherries - reduce pain and stiffness 8) extra virgin olive oil - its compound olecanthal helps to block prostaglandins  9) chili peppers- can be eaten or applied topically via capsaicin 10) mint- helpful for headache, muscle aches, joint pain, and itching 11) garlic- reduces inflammation  Link to further information on diet for chronic pain: [email or web address]   R knee Osteoarthritis: -XRs reviewed and discussed with patient: shows bone on bone arthritis -Has steroid injection by orthopedist today.  -Is considering surgery in the future after she is able to lose more weight.   Morbid obesity -Commended on her efforts in weight loss and intermittent fasting. Will monitor weight each visit as inflammation really contributes to diffuse pain. -Educated that current weight is 285 lbs and current BMI is 52.13 -Educated regarding health benefits of weight loss- for pain, general health, chronic disease prevention, immune health, mental health.  -Will monitor weight every visit.  -Consider Roobois tea daily.  -Discussed the benefits of intermittent fasting. -Discussed foods that can assist in weight loss: 1) leafy greens- high in fiber and nutrients 2) dark chocolate- improves metabolism (if prefer sweetened, best to sweeten with honey instead of sugar).  3) cruciferous vegetables- high in fiber and protein 4) full fat yogurt: high in healthy fat, protein, calcium, and probiotics 5) apples- high in a variety of phytochemicals 6) nuts- high in fiber and protein that increase feelings of fullness 7) grapefruit: rich in nutrients, antioxidants, and  fiber (not to be  taken with anticoagulation) 8) beans- high in protein and fiber 9) salmon- has high quality protein and healthy fats 10) green tea- rich in polyphenols 11) eggs- rich in choline and vitamin D 12) tuna- high protein, boosts metabolism 13) avocado- decreases visceral abdominal fat 14) chicken (pasture raised): high in protein and iron 15) blueberries- reduce abdominal fat and cholesterol 16) whole grains- decreases calories retained during digestion, speeds metabolism 17) chia seeds- curb appetite 18) chilies- increases fat metabolism  -Discussed supplements that can be used:  1) Metatrim 400mg  BID 30 minutes before breakfast and dinner  2) Sphaeranthus indicus and Garcinia mangostana (combinations of these and #1 can be found in capsicum and zychrome  3) green coffee bean extract 400mg  twice per day or Irvingia (african mango) 150 to 300mg  twice per day.  Low back pain: -Continue lidocaine patches

## 2021-02-06 ENCOUNTER — Other Ambulatory Visit: Payer: Self-pay | Admitting: Nurse Practitioner

## 2021-02-06 DIAGNOSIS — M797 Fibromyalgia: Secondary | ICD-10-CM

## 2021-02-08 ENCOUNTER — Ambulatory Visit (INDEPENDENT_AMBULATORY_CARE_PROVIDER_SITE_OTHER): Payer: Medicare Other | Admitting: Nurse Practitioner

## 2021-02-08 ENCOUNTER — Encounter: Payer: Self-pay | Admitting: Nurse Practitioner

## 2021-02-08 ENCOUNTER — Other Ambulatory Visit: Payer: Self-pay

## 2021-02-08 VITALS — BP 112/68 | HR 96 | Temp 98.4°F | Ht 62.0 in | Wt 282.0 lb

## 2021-02-08 DIAGNOSIS — I10 Essential (primary) hypertension: Secondary | ICD-10-CM | POA: Diagnosis not present

## 2021-02-08 DIAGNOSIS — R110 Nausea: Secondary | ICD-10-CM | POA: Diagnosis not present

## 2021-02-08 DIAGNOSIS — R100 Acute abdomen: Secondary | ICD-10-CM | POA: Diagnosis not present

## 2021-02-08 DIAGNOSIS — N390 Urinary tract infection, site not specified: Secondary | ICD-10-CM

## 2021-02-08 DIAGNOSIS — E7800 Pure hypercholesterolemia, unspecified: Secondary | ICD-10-CM

## 2021-02-08 DIAGNOSIS — Z23 Encounter for immunization: Secondary | ICD-10-CM | POA: Diagnosis not present

## 2021-02-08 DIAGNOSIS — Z6841 Body Mass Index (BMI) 40.0 and over, adult: Secondary | ICD-10-CM

## 2021-02-08 DIAGNOSIS — Z0000 Encounter for general adult medical examination without abnormal findings: Secondary | ICD-10-CM

## 2021-02-08 LAB — POCT URINALYSIS DIPSTICK
Bilirubin, UA: NEGATIVE
Blood, UA: NEGATIVE
Glucose, UA: NEGATIVE
Nitrite, UA: POSITIVE
Protein, UA: POSITIVE — AB
Spec Grav, UA: >=1.03 — AB (ref 1.010–1.025)
Urobilinogen, UA: 0.2 E.U./dL
pH, UA: 6 (ref 5.0–8.0)

## 2021-02-08 LAB — POCT UA - MICROALBUMIN
Creatinine, POC: 300 mg/dL
Microalbumin Ur, POC: 80 mg/L

## 2021-02-08 MED ORDER — SHINGRIX 50 MCG/0.5ML IM SUSR: 0.5000 mL | Freq: Once | INTRAMUSCULAR | 0 refills | Status: AC

## 2021-02-08 NOTE — Progress Notes (Signed)
I,Victoria Hamilton,acting as a Education administrator for [name], FNP.,have documented all relevant documentation on the behalf of [name], FNP,as directed by  [name], FNP while in the presence of [name], [place].   This visit occurred during the SARS-CoV-2 public health emergency.  Safety protocols were in place, including screening questions prior to the visit, additional usage of staff PPE, and extensive cleaning of exam room while observing appropriate contact time as indicated for disinfecting solutions.  Subjective:     Patient ID: Ashley Lindsey , female    DOB: 1960/10/10 , 60 y.o.   MRN: [id number]   Chief Complaint  Patient presents with   Annual Exam    HPI  Here for HM. She has been to the Cardiologist since her last visit with medication changes. She is no longer on HCTZ but has been changed to spironolactone.   She is to follow up with ENT for her dizziness. She has not driven since June 2694.   Headaches are more controllable.   Wt Readings from Last 3 Encounters: 02/08/21 : 282 lb (127.9 kg) 02/02/21 : 285 lb (129.3 kg) 01/11/21 : 284 lb (128.8 kg)      Past Medical History:  Diagnosis Date   Abdominal cramps    Arthritis    Cancer ([place])    s/p radiation- last dose 6/12//has been off Tamoxifen 8/12   Chronic headaches    Costochondritis    Fatigue    Fibromyalgia 03/06/2013   History of COVID-19    Hypertension    Lymphedema    RIGHT ARM-////  STATES USE LEFT ARM FOR BP'S   Migraines    Muscle spasms of head and/or neck    following to the breast   Passed out    4th of july   Personal history of radiation therapy 2010   lt breast    S/P radiation therapy 08/19/07 - 10/10/07   Left Breast/5040 cGy/28 fractions with Boost for a toatl dose of 6300 dGy   S/P radiation therapy 08/14/10 -10/03/10   right breast   Sleep apnea    STOP BANG SCORE 5     Family History  Problem Relation Age of Onset   Breast cancer Mother 41   Heart disease Father     Cancer Maternal Aunt 63       d. from "female cancer" possibly cervical   Breast cancer Maternal Aunt 60   Prostate cancer Maternal Uncle 78   Prostate cancer Maternal Uncle 80   Prostate cancer Paternal Uncle    Prostate cancer Paternal Uncle    Prostate cancer Maternal Grandfather 63   Breast cancer Cousin 43       mat 1st cousin     Current Outpatient Medications:    acetaminophen (TYLENOL) 650 MG CR tablet, Take 650 mg by mouth every 8 (eight) hours as needed for pain., Disp: , Rfl:    albuterol (VENTOLIN HFA) 108 (90 Base) MCG/ACT inhaler, INHALE 1-2 PUFFS INTO THE LUNGS EVERY 6 (SIX) HOURS AS NEEDED FOR WHEEZING OR SHORTNESS OF BREATH., Disp: 6.7 each, Rfl: 1   aspirin-acetaminophen-caffeine (EXCEDRIN MIGRAINE) 250-250-65 MG tablet, Take 1 tablet by mouth every 6 (six) hours as needed for headache., Disp: , Rfl:    Bisacodyl (DULCOLAX PO), Take 1 capsule by mouth daily as needed (constipation). , Disp: , Rfl:    cholecalciferol 5000 units TABS, Take 1 tablet (5,000 Units total) by mouth 2 (two) times daily. (Patient taking differently: Take 5,000 Units by  mouth daily.), Disp: , Rfl:    cyclobenzaprine (FLEXERIL) 10 MG tablet, TAKE 1 TABLET BY MOUTH EVERY DAY AS NEEDED, Disp: 90 tablet, Rfl: 2   DEXILANT 60 MG capsule, TAKE 1 CAPSULE BY MOUTH EVERY DAY, Disp: 90 capsule, Rfl: 1   diphenhydrAMINE (BENADRYL) 25 MG tablet, Take 1 tablet (25 mg total) by mouth every 6 (six) hours as needed for up to 20 doses (migraine). Take with reglan for migraine headache, Disp: 20 tablet, Rfl: 0   ELDERBERRY PO, Take 1 tablet by mouth daily., Disp: , Rfl:    fish oil-omega-3 fatty acids 1000 MG capsule, Take 1 g by mouth daily., Disp: , Rfl:    Flaxseed, Linseed, (FLAXSEED OIL MAX STR PO), Take 1 capsule by mouth daily. , Disp: , Rfl:    gabapentin (NEURONTIN) 300 MG capsule, TAKE 1 CAPSULE BY MOUTH THREE TIMES A DAY, Disp: 90 capsule, Rfl: 3   Iron-FA-B Cmp-C-Biot-Probiotic (FUSION PLUS) CAPS, Take 1  capsule by mouth daily., Disp: 30 capsule, Rfl: 3   losartan (COZAAR) 25 MG tablet, Take 1 tablet (25 mg total) by mouth daily at 10 pm. Hold if systolic blood pressure (top number) less than 100 mmHg, Disp: 90 tablet, Rfl: 0   meloxicam (MOBIC) 7.5 MG tablet, TAKE 1 TABLET BY MOUTH EVERY DAY, Disp: 90 tablet, Rfl: 0   Multiple Vitamins-Minerals (MULTIVITAMIN & MINERAL PO), Take 1 tablet by mouth daily., Disp: , Rfl:    omeprazole (PRILOSEC) 20 MG capsule, Take 20 mg by mouth daily., Disp: , Rfl:    ondansetron (ZOFRAN) 4 MG tablet, TAKE 1 TABLET BY MOUTH DAILY AS NEEDED FOR NAUSEA OR VOMITING, Disp: 30 tablet, Rfl: 1   OVER THE COUNTER MEDICATION, Take 125 mg by mouth daily. [place]/Simethicone, Disp: , Rfl:    propranolol (INDERAL) 20 MG tablet, TAKE 1 TABLET BY MOUTH TWICE A DAY, Disp: 180 tablet, Rfl: 0   spironolactone (ALDACTONE) 25 MG tablet, Take 1 tablet (25 mg total) by mouth every morning., Disp: 90 tablet, Rfl: 0   SYMBICORT 80-4.5 MCG/ACT inhaler, INHALE 2 PUFFS INTO THE LUNGS IN THE MORNING AND AT BEDTIME., Disp: 30.6 each, Rfl: 1   triamcinolone cream (KENALOG) 0.1 %, Apply 1 application topically 2 (two) times daily. Apply for up to one week. (Patient taking differently: Apply 1 application topically 2 (two) times daily as needed (rash).), Disp: 30 g, Rfl: 0   Turmeric 500 MG CAPS, Take by mouth., Disp: , Rfl:    venlafaxine XR (EFFEXOR-XR) 150 MG 24 hr capsule, TAKE 1 CAPSULE BY MOUTH NIGHTLY WITH 75 MG CAPSULE (TOTAL OF 225 MG NIGHTLY), Disp: 90 capsule, Rfl: 0   venlafaxine XR (EFFEXOR-XR) 75 MG 24 hr capsule, TAKE 1 CAPSULE BY MOUTH DAILY IN COMBINATION WITH XR 150 MG (TO EQUAL 225 MG TOTAL DAILY)., Disp: 90 capsule, Rfl: 3   vitamin E 200 UNIT capsule, Take 200 Units by mouth daily., Disp: , Rfl:    Zoster Vaccine Adjuvanted (SHINGRIX) injection, Inject 0.5 mLs into the muscle once for 1 dose. Repeat in 2-6 months, Disp: 0.5 mL, Rfl: 0   Allergies  Allergen Reactions   Aspirin      unknown   Penicillins Hives and Swelling    Has patient had a PCN reaction causing immediate rash, facial/tongue/throat swelling, SOB or lightheadedness with hypotension: Yes Has patient had a PCN reaction causing severe rash involving mucus membranes or skin necrosis: Yes Has patient had a PCN reaction that required hospitalization Yes Has patient had  a PCN reaction occurring within the last 10 years: No If all of the above answers are "NO", then may proceed with Cephalosporin use.       The patient states she is status post hysterectomy  No LMP recorded. Patient has had a hysterectomy.. Negative for Dysmenorrhea and Negative for Menorrhagia. Negative for: breast discharge, breast lump(s), breast pain and breast self exam. Associated symptoms include abnormal vaginal bleeding. Pertinent negatives include abnormal bleeding (hematology), anxiety, decreased libido, depression, difficulty falling sleep, dyspareunia, history of infertility, nocturia, sexual dysfunction, sleep disturbances, urinary incontinence, urinary urgency, vaginal discharge and vaginal itching. Diet regular; reports having a poor appetite. She will make a smoothie. She is havig stomach pain will have nausea. When she eats fried food it makes her sick. The patient states her exercise level is minimal - only going to the pool once a month due to having episodes of dizziness.   The patient's tobacco use is:  Social History   Tobacco Use  Smoking Status Former   Packs/day: 0.25   Years: 1.00   Pack years: 0.25   Types: Cigarettes  Smokeless Tobacco Never   She has been exposed to passive smoke. The patient's alcohol use is:  Social History   Substance and Sexual Activity  Alcohol Use Not Currently    Review of Systems  Constitutional: Negative.   HENT: Negative.    Eyes: Negative.   Respiratory: Negative.    Cardiovascular: Negative.   Gastrointestinal:  Positive for abdominal pain.  Endocrine: Negative.    Genitourinary: Negative.   Musculoskeletal: Negative.   Skin: Negative.   Allergic/Immunologic: Negative.   Neurological:  Positive for dizziness (Being followed by ENT and Cardiology.). Negative for headaches.  Hematological: Negative.   Psychiatric/Behavioral: Negative.      Today's Vitals   02/08/21 1112  BP: 112/68  Pulse: 96  Temp: 98.4 F (36.9 C)  Weight: 282 lb (127.9 kg)  Height: 5' 2"  (1.575 m)   Body mass index is 51.58 kg/m.  Wt Readings from Last 3 Encounters:  02/08/21 282 lb (127.9 kg)  02/02/21 285 lb (129.3 kg)  01/11/21 284 lb (128.8 kg)    BP Readings from Last 3 Encounters:  02/08/21 112/68  02/02/21 108/74  12/15/20 (!) 149/91    Objective:  Physical Exam Vitals reviewed.  Constitutional:      General: She is not in acute distress.    Appearance: Normal appearance. She is well-developed. She is obese.  HENT:     Head: Normocephalic and atraumatic.     Right Ear: Hearing, tympanic membrane, ear canal and external ear normal. There is no impacted cerumen.     Left Ear: Hearing, tympanic membrane, ear canal and external ear normal. There is no impacted cerumen.     Nose:     Comments: Deferred - masked    Mouth/Throat:     Comments: Deferred - masked Eyes:     General: Lids are normal.     Extraocular Movements: Extraocular movements intact.     Conjunctiva/sclera: Conjunctivae normal.     Pupils: Pupils are equal, round, and reactive to light.     Funduscopic exam:    Right eye: No papilledema.        Left eye: No papilledema.  Neck:     Thyroid: No thyroid mass.     Vascular: No carotid bruit.  Cardiovascular:     Rate and Rhythm: Normal rate and regular rhythm.     Pulses: Normal pulses.  Heart sounds: Normal heart sounds. No murmur heard. Pulmonary:     Effort: Pulmonary effort is normal. No respiratory distress.     Breath sounds: Normal breath sounds. No wheezing.  Chest:     Chest wall: No mass or swelling.  Breasts:     Tanner Score is 5.     Right: Normal. No swelling, mass or tenderness.     Left: Normal. No swelling, mass or tenderness.  Abdominal:     General: Abdomen is flat. Bowel sounds are normal. There is no distension.     Palpations: Abdomen is soft.     Tenderness: There is no abdominal tenderness.  Musculoskeletal:        General: No swelling or tenderness. Normal range of motion.     Cervical back: Full passive range of motion without pain, normal range of motion and neck supple.     Right lower leg: No edema.     Left lower leg: No edema.  Lymphadenopathy:     Upper Body:     Right upper body: No supraclavicular, axillary or pectoral adenopathy.     Left upper body: No supraclavicular, axillary or pectoral adenopathy.  Skin:    General: Skin is warm and dry.     Capillary Refill: Capillary refill takes less than 2 seconds.  Neurological:     General: No focal deficit present.     Mental Status: She is alert and oriented to person, place, and time.     Cranial Nerves: No cranial nerve deficit.     Sensory: No sensory deficit.     Motor: No weakness.  Psychiatric:        Mood and Affect: Mood normal.        Behavior: Behavior normal.        Thought Content: Thought content normal.        Judgment: Judgment normal.        Assessment And Plan:     1. Annual physical exam Behavior modifications discussed and diet history reviewed.   Pt will continue to exercise regularly and modify diet with low GI, plant based foods and decrease intake of processed foods.  Recommend intake of daily multivitamin, Vitamin D, and calcium.  Recommend mammogram and colonoscopy for preventive screenings, as well as recommend immunizations that include influenza, TDAP, and Shingles - CBC  2. Essential hypertension Comments: Blood pressure is in good control Continue follow up with Cardiology - Hemoglobin A1c - Lipid panel - POCT Urinalysis Dipstick (81002) - POCT UA - Microalbumin  3. Elevated  cholesterol Comments: Stable, continue  - CMP14+EGFR  4. Class 3 severe obesity due to excess calories with serious comorbidity and body mass index (BMI) of 50.0 to 59.9 in adult [facility]) Chronic Discussed healthy diet and regular exercise options  Encouraged to exercise at least 150 minutes per week with 2 days of strength training She is encouraged to strive for BMI less than 30 to decrease cardiac risk. Advised to aim for at least 150 minutes of exercise per week.   5. Encounter for immunization Influenza vaccine administered Encouraged to take Tylenol as needed for fever or muscle aches. Pneumonia 23 also given in office - Pneumococcal polysaccharide vaccine 23-valent greater than or equal to 2yo subcutaneous/IM - Flu Vaccine QUAD 6+ mos PF IM (Fluarix Quad PF) - Zoster Vaccine Adjuvanted [facility]) injection; Inject 0.5 mLs into the muscle once for 1 dose. Repeat in 2-6 months  Dispense: 0.5 mL; Refill: 0  6. Nausea She  has had persistent nausea, will check ultrasound and she is encouraged to follow up with GI for further evaluation - US Abdomen Limited RUQ (LIVER/GB); Future     Patient was given opportunity to ask questions. Patient verbalized understanding of the plan and was able to repeat key elements of the plan. All questions were answered to their satisfaction.   [name], FNP   I, [name], FNP, have reviewed all documentation for this visit. The documentation on 02/08/21 for the exam, diagnosis, procedures, and orders are all accurate and complete.  THE PATIENT IS ENCOURAGED TO PRACTICE SOCIAL DISTANCING DUE TO THE COVID-19 PANDEMIC.

## 2021-02-08 NOTE — Patient Instructions (Signed)
Health Maintenance, Female Adopting a healthy lifestyle and getting preventive care are important in promoting health and wellness. Ask your health care provider about: The right schedule for you to have regular tests and exams. Things you can do on your own to prevent diseases and keep yourself healthy. What should I know about diet, weight, and exercise? Eat a healthy diet  Eat a diet that includes plenty of vegetables, fruits, low-fat dairy products, and lean protein. Do not eat a lot of foods that are high in solid fats, added sugars, or sodium. Maintain a healthy weight Body mass index (BMI) is used to identify weight problems. It estimates body fat based on height and weight. Your health care provider can help determine your BMI and help you achieve or maintain a healthy weight. Get regular exercise Get regular exercise. This is one of the most important things you can do for your health. Most adults should: Exercise for at least 150 minutes each week. The exercise should increase your heart rate and make you sweat (moderate-intensity exercise). Do strengthening exercises at least twice a week. This is in addition to the moderate-intensity exercise. Spend less time sitting. Even light physical activity can be beneficial. Watch cholesterol and blood lipids Have your blood tested for lipids and cholesterol at 62 years of age, then have this test every 5 years. Have your cholesterol levels checked more often if: Your lipid or cholesterol levels are high. You are older than 62 years of age. You are at high risk for heart disease. What should I know about cancer screening? Depending on your health history and family history, you may need to have cancer screening at various ages. This may include screening for: Breast cancer. Cervical cancer. Colorectal cancer. Skin cancer. Lung cancer. What should I know about heart disease, diabetes, and high blood pressure? Blood pressure and heart  disease High blood pressure causes heart disease and increases the risk of stroke. This is more likely to develop in people who have high blood pressure readings, are of African descent, or are overweight. Have your blood pressure checked: Every 3-5 years if you are 18-39 years of age. Every year if you are 40 years old or older. Diabetes Have regular diabetes screenings. This checks your fasting blood sugar level. Have the screening done: Once every three years after age 40 if you are at a normal weight and have a low risk for diabetes. More often and at a younger age if you are overweight or have a high risk for diabetes. What should I know about preventing infection? Hepatitis B If you have a higher risk for hepatitis B, you should be screened for this virus. Talk with your health care provider to find out if you are at risk for hepatitis B infection. Hepatitis C Testing is recommended for: Everyone born from 1945 through 1965. Anyone with known risk factors for hepatitis C. Sexually transmitted infections (STIs) Get screened for STIs, including gonorrhea and chlamydia, if: You are sexually active and are younger than 62 years of age. You are older than 62 years of age and your health care provider tells you that you are at risk for this type of infection. Your sexual activity has changed since you were last screened, and you are at increased risk for chlamydia or gonorrhea. Ask your health care provider if you are at risk. Ask your health care provider about whether you are at high risk for HIV. Your health care provider may recommend a prescription medicine   to help prevent HIV infection. If you choose to take medicine to prevent HIV, you should first get tested for HIV. You should then be tested every 3 months for as long as you are taking the medicine. Pregnancy If you are about to stop having your period (premenopausal) and you may become pregnant, seek counseling before you get  pregnant. Take 400 to 800 micrograms (mcg) of folic acid every day if you become pregnant. Ask for birth control (contraception) if you want to prevent pregnancy. Osteoporosis and menopause Osteoporosis is a disease in which the bones lose minerals and strength with aging. This can result in bone fractures. If you are 65 years old or older, or if you are at risk for osteoporosis and fractures, ask your health care provider if you should: Be screened for bone loss. Take a calcium or vitamin D supplement to lower your risk of fractures. Be given hormone replacement therapy (HRT) to treat symptoms of menopause. Follow these instructions at home: Lifestyle Do not use any products that contain nicotine or tobacco, such as cigarettes, e-cigarettes, and chewing tobacco. If you need help quitting, ask your health care provider. Do not use street drugs. Do not share needles. Ask your health care provider for help if you need support or information about quitting drugs. Alcohol use Do not drink alcohol if: Your health care provider tells you not to drink. You are pregnant, may be pregnant, or are planning to become pregnant. If you drink alcohol: Limit how much you use to 0-1 drink a day. Limit intake if you are breastfeeding. Be aware of how much alcohol is in your drink. In the [place] one drink equals one 12 oz bottle of beer (355 mL), one 5 oz glass of wine (148 mL), or one 1 oz glass of hard liquor (44 mL). General instructions Schedule regular health, dental, and eye exams. Stay current with your vaccines. Tell your health care provider if: You often feel depressed. You have ever been abused or do not feel safe at home. Summary Adopting a healthy lifestyle and getting preventive care are important in promoting health and wellness. Follow your health care provider's instructions about healthy diet, exercising, and getting tested or screened for diseases. Follow your health care provider's  instructions on monitoring your cholesterol and blood pressure. This information is not intended to replace advice given to you by your health care provider. Make sure you discuss any questions you have with your health care provider. Document Revised: 06/17/2020 Document Reviewed: 04/02/2018 Elsevier Patient Education  2022 [employer]  

## 2021-02-09 ENCOUNTER — Other Ambulatory Visit: Payer: Self-pay | Admitting: Nurse Practitioner

## 2021-02-09 ENCOUNTER — Encounter: Payer: Self-pay | Admitting: Nurse Practitioner

## 2021-02-09 DIAGNOSIS — R110 Nausea: Secondary | ICD-10-CM

## 2021-02-09 LAB — CMP14+EGFR
ALT: 18 IU/L (ref 0–32)
AST: 20 IU/L (ref 0–40)
Albumin/Globulin Ratio: 1.6 (ref 1.2–2.2)
Albumin: 4.6 g/dL (ref 3.8–4.8)
Alkaline Phosphatase: 88 IU/L (ref 44–121)
BUN/Creatinine Ratio: 13 (ref 12–28)
BUN: 10 mg/dL (ref 8–27)
Bilirubin Total: <0.2 mg/dL (ref 0.0–1.2)
CO2: 24 mmol/L (ref 20–29)
Calcium: 9.4 mg/dL (ref 8.7–10.3)
Chloride: 97 mmol/L (ref 96–106)
Creatinine, Ser: 0.77 mg/dL (ref 0.57–1.00)
Globulin, Total: 2.9 g/dL (ref 1.5–4.5)
Glucose: 96 mg/dL (ref 70–99)
Potassium: 4.3 mmol/L (ref 3.5–5.2)
Sodium: 139 mmol/L (ref 134–144)
Total Protein: 7.5 g/dL (ref 6.0–8.5)
eGFR: 88 mL/min/{1.73_m2} (ref >59)

## 2021-02-09 LAB — LIPID PANEL
Chol/HDL Ratio: 3 ratio (ref 0.0–4.4)
Cholesterol, Total: 215 mg/dL — ABNORMAL HIGH (ref 100–199)
HDL: 72 mg/dL (ref >39)
LDL Chol Calc (NIH): 103 mg/dL — ABNORMAL HIGH (ref 0–99)
Triglycerides: 240 mg/dL — ABNORMAL HIGH (ref 0–149)
VLDL Cholesterol Cal: 40 mg/dL (ref 5–40)

## 2021-02-09 LAB — CBC
Hematocrit: 33.7 % — ABNORMAL LOW (ref 34.0–46.6)
Hemoglobin: 10.2 g/dL — ABNORMAL LOW (ref 11.1–15.9)
MCH: 24.3 pg — ABNORMAL LOW (ref 26.6–33.0)
MCHC: 30.3 g/dL — ABNORMAL LOW (ref 31.5–35.7)
MCV: 80 fL (ref 79–97)
Platelets: 397 10*3/uL (ref 150–450)
RBC: 4.19 x10E6/uL (ref 3.77–5.28)
RDW: 14.6 % (ref 11.7–15.4)
WBC: 10.9 10*3/uL — ABNORMAL HIGH (ref 3.4–10.8)

## 2021-02-09 LAB — HEMOGLOBIN A1C
Est. average glucose Bld gHb Est-mCnc: 143 mg/dL
Hgb A1c MFr Bld: 6.6 % — ABNORMAL HIGH (ref 4.8–5.6)

## 2021-02-10 ENCOUNTER — Encounter: Payer: Self-pay | Admitting: Nurse Practitioner

## 2021-02-10 LAB — SPECIMEN STATUS REPORT

## 2021-02-10 LAB — AMYLASE: Amylase: 50 U/L (ref 31–110)

## 2021-02-10 LAB — LIPASE: Lipase: 22 U/L (ref 14–72)

## 2021-02-13 ENCOUNTER — Other Ambulatory Visit: Payer: Self-pay | Admitting: Nurse Practitioner

## 2021-02-13 ENCOUNTER — Encounter: Payer: Self-pay | Admitting: Nurse Practitioner

## 2021-02-13 DIAGNOSIS — N390 Urinary tract infection, site not specified: Secondary | ICD-10-CM

## 2021-02-13 LAB — URINE CULTURE

## 2021-02-13 MED ORDER — NITROFURANTOIN MONOHYD MACRO 100 MG PO CAPS: 100.0000 mg | ORAL_CAPSULE | Freq: Two times a day (BID) | ORAL | 0 refills | Status: AC

## 2021-02-17 ENCOUNTER — Other Ambulatory Visit: Payer: Self-pay

## 2021-02-17 ENCOUNTER — Ambulatory Visit
Admission: RE | Admit: 2021-02-17 | Discharge: 2021-02-17 | Disposition: A | Discharge status: Home / Self Care | Payer: Medicare Other | Source: Ambulatory Visit | Attending: Nurse Practitioner | Admitting: Nurse Practitioner

## 2021-02-17 DIAGNOSIS — R110 Nausea: Secondary | ICD-10-CM | POA: Diagnosis not present

## 2021-02-17 DIAGNOSIS — R109 Unspecified abdominal pain: Secondary | ICD-10-CM | POA: Diagnosis not present

## 2021-02-21 NOTE — Telephone Encounter (Signed)
Received a notification from [place].  "Patient has been rescheduled 3 times and has no showed I will void order on Monday 24th."

## 2021-03-01 ENCOUNTER — Other Ambulatory Visit: Payer: Self-pay | Admitting: Nurse Practitioner

## 2021-03-01 DIAGNOSIS — R030 Elevated blood-pressure reading, without diagnosis of hypertension: Secondary | ICD-10-CM

## 2021-03-06 DIAGNOSIS — H903 Sensorineural hearing loss, bilateral: Secondary | ICD-10-CM | POA: Diagnosis not present

## 2021-03-08 ENCOUNTER — Other Ambulatory Visit: Payer: Self-pay | Admitting: Nurse Practitioner

## 2021-03-08 ENCOUNTER — Other Ambulatory Visit: Payer: Self-pay | Admitting: Hematology and Oncology

## 2021-03-08 DIAGNOSIS — C50911 Malignant neoplasm of unspecified site of right female breast: Secondary | ICD-10-CM | POA: Diagnosis not present

## 2021-03-08 DIAGNOSIS — C50912 Malignant neoplasm of unspecified site of left female breast: Secondary | ICD-10-CM | POA: Diagnosis not present

## 2021-03-13 ENCOUNTER — Other Ambulatory Visit: Payer: Self-pay

## 2021-03-14 ENCOUNTER — Telehealth: Payer: Self-pay | Admitting: *Deleted

## 2021-03-14 DIAGNOSIS — C50212 Malignant neoplasm of upper-inner quadrant of left female breast: Secondary | ICD-10-CM

## 2021-03-14 NOTE — Telephone Encounter (Signed)
Received call from pt with complaint of increase in left breast lymphedema.  Pt denies recent injury or trauma. Per MD pt needing to be referred to cancer rehab.  Referral placed and pt verbalized understanding.

## 2021-03-15 ENCOUNTER — Ambulatory Visit: Payer: Medicare Other | Admitting: Cardiology

## 2021-03-15 ENCOUNTER — Encounter: Payer: Self-pay | Admitting: Cardiology

## 2021-03-15 ENCOUNTER — Other Ambulatory Visit: Payer: Self-pay

## 2021-03-15 VITALS — BP 105/65 | HR 99 | Temp 98.0°F | Resp 17 | Ht 62.0 in | Wt 285.2 lb

## 2021-03-15 DIAGNOSIS — I429 Cardiomyopathy, unspecified: Secondary | ICD-10-CM

## 2021-03-15 DIAGNOSIS — G4733 Obstructive sleep apnea (adult) (pediatric): Secondary | ICD-10-CM

## 2021-03-15 DIAGNOSIS — R911 Solitary pulmonary nodule: Secondary | ICD-10-CM

## 2021-03-15 DIAGNOSIS — I10 Essential (primary) hypertension: Secondary | ICD-10-CM

## 2021-03-15 DIAGNOSIS — Z87898 Personal history of other specified conditions: Secondary | ICD-10-CM | POA: Diagnosis not present

## 2021-03-15 MED ORDER — METOPROLOL SUCCINATE ER 50 MG PO TB24: 50.0000 mg | ORAL_TABLET | Freq: Every morning | ORAL | 0 refills | Status: DC

## 2021-03-15 NOTE — Progress Notes (Signed)
Date:  03/15/2021   ID:  Ashley Lindsey, DOB 04/03/1961, MRN [id number]  PCP:  [name], FNP  Cardiologist: Rex Kras, DO, [facility]  (established care 11/11/2020)  Date: 03/15/21 Last Office Visit: 12/15/2020  Chief Complaint  Patient presents with   Follow-up   Cardiomyopathy    3 MONTH   HPI  Ashley Lindsey is a 60 y.o. female who presents to the office with a chief complaint of " 67-month follow-up cardiomyopathy. " Patient's past medical history and cardiovascular risk factors include: Cardiomyopathy, history of COVID-19 infection, TIA, HTN, OSA (CPAP as Nov 2022), fibromyalgia, history of breast cancer bilaterally status post chemotherapy and radiation, former smoker, obesity due to excess calories.  She is referred to the office at the request of [name], FNP for evaluation of syncope.  In May 2022 patient was at church and had an episode of syncope most likely vasovagal etiology.  She was taken to [facility] and underwent a stroke code work-up and then referred to cardiology for further evaluation and management.  During the work-up patient was found to have mildly reduced LVEF most likely secondary to her history of chemotherapy and radiation due to bilateral breast cancer in the past.  Nuclear stress test was performed due to precordial discomfort and other cardiovascular risk factors which was overall low risk.  Since last office visit patient did undergo extended Holter monitor which does not note evidence of atrial fibrillation, sinus pauses, or malignant dysrhythmias.  She has not had any reoccurrence of syncope for the past 6 months.  She is now on CPAP for her underlying OSA as of November 2022.  Patient states that she is not enthusiastic about using the device but notes that it is important.  She is also followed up with ENT during her work-up of syncope and has not been diagnosed with vertigo but needs a hearing aid.  Otherwise patient is relatively  stable since last office visit.  Given her mildly reduced LVEF and episodes of hypotension and orthostasis in the past up titration of GDMT has been slowly uptitrated.  She is now on low-dose losartan, spironolactone.  She was recommended to stop her HCTZ but continues to take it.  She is on propranolol for her underlying migraines.  No hospitalizations or congestive heart failure.  And denies orthopnea, paroxysmal nocturnal dyspnea or lower extremity swelling.   ALLERGIES: Allergies  Allergen Reactions   Aspirin     unknown   Penicillins Hives and Swelling    Has patient had a PCN reaction causing immediate rash, facial/tongue/throat swelling, SOB or lightheadedness with hypotension: Yes Has patient had a PCN reaction causing severe rash involving mucus membranes or skin necrosis: Yes Has patient had a PCN reaction that required hospitalization Yes Has patient had a PCN reaction occurring within the last 10 years: No If all of the above answers are "NO", then may proceed with Cephalosporin use.     MEDICATION LIST PRIOR TO VISIT: Current Meds  Medication Sig   acetaminophen (TYLENOL) 650 MG CR tablet Take 650 mg by mouth every 8 (eight) hours as needed for pain.   albuterol (VENTOLIN HFA) 108 (90 Base) MCG/ACT inhaler INHALE 1-2 PUFFS INTO THE LUNGS EVERY 6 (SIX) HOURS AS NEEDED FOR WHEEZING OR SHORTNESS OF BREATH.   aspirin-acetaminophen-caffeine (EXCEDRIN MIGRAINE) 250-250-65 MG tablet Take 1 tablet by mouth every 6 (six) hours as needed for headache.   Bisacodyl (DULCOLAX PO) Take 1 capsule by mouth daily as needed (  constipation).    cholecalciferol 5000 units TABS Take 1 tablet (5,000 Units total) by mouth 2 (two) times daily. (Patient taking differently: Take 5,000 Units by mouth daily.)   cyclobenzaprine (FLEXERIL) 10 MG tablet Take 1 tablet by mouth daily as needed.   dexlansoprazole (DEXILANT) 60 MG capsule TAKE 1 CAPSULE BY MOUTH EVERY DAY   diphenhydrAMINE (BENADRYL) 25 MG  tablet Take 1 tablet (25 mg total) by mouth every 6 (six) hours as needed for up to 20 doses (migraine). Take with reglan for migraine headache   ELDERBERRY PO Take 1 tablet by mouth daily.   fish oil-omega-3 fatty acids 1000 MG capsule Take 1 g by mouth daily.   Flaxseed, Linseed, (FLAXSEED OIL MAX STR PO) Take 1 capsule by mouth daily.    gabapentin (NEURONTIN) 300 MG capsule TAKE 1 CAPSULE BY MOUTH THREE TIMES A DAY   Iron-FA-B Cmp-C-Biot-Probiotic (FUSION PLUS) CAPS Take 1 capsule by mouth daily.   losartan (COZAAR) 25 MG tablet Take 1 tablet (25 mg total) by mouth daily at 10 pm. Hold if systolic blood pressure (top number) less than 100 mmHg   meloxicam (MOBIC) 7.5 MG tablet TAKE 1 TABLET BY MOUTH EVERY DAY   metoprolol succinate (TOPROL XL) 50 MG 24 hr tablet Take 1 tablet (50 mg total) by mouth every morning.   Multiple Vitamins-Minerals (MULTIVITAMIN & MINERAL PO) Take 1 tablet by mouth daily.   omeprazole (PRILOSEC) 20 MG capsule Take 20 mg by mouth daily.   ondansetron (ZOFRAN) 4 MG tablet TAKE 1 TABLET BY MOUTH DAILY AS NEEDED FOR NAUSEA OR VOMITING   OVER THE COUNTER MEDICATION Take 125 mg by mouth daily. [place]/Simethicone   spironolactone (ALDACTONE) 25 MG tablet Take 1 tablet (25 mg total) by mouth every morning.   SYMBICORT 80-4.5 MCG/ACT inhaler INHALE 2 PUFFS INTO THE LUNGS IN THE MORNING AND AT BEDTIME.   triamcinolone cream (KENALOG) 0.1 % Apply 1 application topically 2 (two) times daily. Apply for up to one week. (Patient taking differently: Apply 1 application topically 2 (two) times daily as needed (rash).)   Turmeric 500 MG CAPS Take by mouth.   venlafaxine XR (EFFEXOR-XR) 150 MG 24 hr capsule TAKE 1 CAPSULE BY MOUTH NIGHTLY WITH 75 MG CAPSULE (TOTAL OF 225 MG NIGHTLY)   venlafaxine XR (EFFEXOR-XR) 75 MG 24 hr capsule TAKE 1 CAPSULE BY MOUTH DAILY IN COMBINATION WITH XR 150 MG (TO EQUAL 225 MG TOTAL DAILY).   vitamin E 200 UNIT capsule Take 200 Units by mouth daily.    [DISCONTINUED] cyclobenzaprine (FLEXERIL) 10 MG tablet TAKE 1 TABLET BY MOUTH EVERY DAY AS NEEDED   [DISCONTINUED] hydrochlorothiazide (HYDRODIURIL) 25 MG tablet TAKE 1 TABLET BY MOUTH EVERY DAY   [DISCONTINUED] propranolol (INDERAL) 20 MG tablet TAKE 1 TABLET BY MOUTH TWICE A DAY     PAST MEDICAL HISTORY: Past Medical History:  Diagnosis Date   Abdominal cramps    Arthritis    Cancer ([place])    s/p radiation- last dose 6/12//has been off Tamoxifen 8/12   Chronic headaches    Costochondritis    Fatigue    Fibromyalgia 03/06/2013   History of COVID-19    Hypertension    Lymphedema    RIGHT ARM-////  STATES USE LEFT ARM FOR BP'S   Migraines    Muscle spasms of head and/or neck    following to the breast   Passed out    4th of july   Personal history of radiation therapy 2010   lt  breast    S/P radiation therapy 08/19/07 - 10/10/07   Left Breast/5040 cGy/28 fractions with Boost for a toatl dose of 6300 dGy   S/P radiation therapy 08/14/10 -10/03/10   right breast   Sleep apnea    STOP BANG SCORE 5    PAST SURGICAL HISTORY: Past Surgical History:  Procedure Laterality Date   ABDOMINAL HYSTERECTOMY     with left salpingooophorectomy   BREAST LUMPECTOMY  2009/2012   LEFT/RIGHT with lymph node dissection   KNEE ARTHROSCOPY     right   OOPHORECTOMY  2013    FAMILY HISTORY: The patient family history includes Breast cancer (age of onset: 79) in her cousin; Breast cancer (age of onset: 54) in her maternal aunt; Breast cancer (age of onset: 89) in her mother; Cancer (age of onset: 69) in her maternal aunt; Heart disease in her father; Prostate cancer in her paternal uncle and paternal uncle; Prostate cancer (age of onset: 61) in her maternal uncle; Prostate cancer (age of onset: 76) in her maternal uncle; Prostate cancer (age of onset: 13) in her maternal grandfather.  SOCIAL HISTORY:  The patient  reports that she has quit smoking. Her smoking use included cigarettes. She has a  0.25 pack-year smoking history. She has never used smokeless tobacco. She reports that she does not currently use alcohol. She reports that she does not use drugs.  REVIEW OF SYSTEMS: Review of Systems  Constitutional: Negative for chills and fever.  HENT:  Negative for hoarse voice and nosebleeds.   Eyes:  Negative for discharge, double vision and pain.  Cardiovascular:  Positive for leg swelling (chronic, better in morning). Negative for chest pain, claudication, dyspnea on exertion, near-syncope, orthopnea, palpitations, paroxysmal nocturnal dyspnea and syncope.  Respiratory:  Negative for hemoptysis and shortness of breath.   Musculoskeletal:  Negative for muscle cramps and myalgias.  Gastrointestinal:  Negative for abdominal pain, constipation, diarrhea, hematemesis, hematochezia, melena, nausea and vomiting.  Neurological:  Negative for dizziness and light-headedness.   PHYSICAL EXAM: Vitals with BMI 03/15/2021 02/08/2021 02/02/2021  Height 5\' 2"  5\' 2"  5\' 2"   Weight 285 lbs 3 oz 282 lbs 285 lbs  BMI 52.15 37.10 62.69  Systolic 485 462 703  Diastolic 65 68 74  Pulse 99 96 86    CONSTITUTIONAL: Appears older than stated age, hemodynamically stable well-developed and well-nourished. No acute distress.  SKIN: Skin is warm and dry. No rash noted. No cyanosis. No pallor. No jaundice HEAD: Normocephalic and atraumatic.  EYES: No scleral icterus MOUTH/THROAT: Moist oral membranes.  NECK: Unable to evaluate JVP due to short neck stature and adipose tissue, no thyromegaly noted. No carotid bruits  LYMPHATIC: No visible cervical adenopathy.  CHEST Normal respiratory effort. No intercostal retractions  LUNGS: Clear to auscultation bilaterally.  No stridor. No wheezes. No rales.  CARDIOVASCULAR: Regular rate and rhythm, positive S1-S2, no murmurs rubs or gallops appreciated ABDOMINAL: Obese, soft, nontender, nondistended, positive bowel sounds in all 4 quadrants no apparent ascites.   EXTREMITIES: Trace bilateral peripheral edema, palpable DP and PT pulses. HEMATOLOGIC: No significant bruising NEUROLOGIC: Oriented to person, place, and time. Nonfocal. Normal muscle tone.  PSYCHIATRIC: Normal mood and affect. Normal behavior. Cooperative  CARDIAC DATABASE: EKG: 11/11/2020: Normal sinus rhythm, 75 bpm, left atrial enlargement, without underlying ischemia or injury pattern.  Echo:  12/08/2020:  Endocardial definition is not well visualized. This may limit accuracy.  Consider alternate imaging studies, if clinically indicated.   Left  ventricle cavity is normal in size.  Mild concentric hypertrophy of the left ventricle. Mild global hypokinesis. LVEF probably 45%. Doppler evidence of grade I (impaired) diastolic dysfunction, normal LAP.  Left atrial cavity is mildly dilated.  Mild (Grade I) mitral regurgitation.  Mild tricuspid regurgitation.  No evidence of pulmonary hypertension.  Stress Test: Lexiscan Tetrofosmin stress test 12/05/2020: Lexiscan nuclear stress test performed using 1-day protocol. Mildly decreased tracer uptake in inferior myocardium, likely due to gut attenuation. Stress LVEF 62%. Low risk study.   CTA Head and Neck w/ and w/o contrast: 09/11/2020: 1. CT perfusion negative for acute infarct or ischemia 2. Negative for intracranial large vessel occlusion 3. No significant carotid or vertebral artery stenosis in the neck. No intracranial stenosis. 4. Code stroke imaging results were communicated on 09/11/2020 at 2:34 pm to provider Rory Percy via text page  CT Head w/o contrast: 09/11/2020 1. Negative CT head 2. ASPECTS is 10 3. Code stroke imaging results were communicated on 09/11/2020 at 1:59 pm to provider Rory Percy via text page  MRI brain with and without contrast: 10/04/2020 1. No specific cause for symptoms.  No acute or subacute infarct. 2. Mild white matter disease without specific demyelinating pattern.  Carotid artery duplex 12/08/2020:   The bifurcation, internal, external and common carotid arteries reveal no  evidence of significant stenosis, bilaterally. No significant plaque  burden noted bilaterally.  Right vertebral artery flow is not visualized. Left vertebral artery flow  is not visualized.  14-day mobile cardiac ambulatory telemetry: Patch Wear Time: 12 days and 0 hours  Dominant rhythm normal sinus, followed by sinus tachycardia (15% burden).  Heart rate 68-203 bpm. Avg HR 91 bpm. No atrial fibrillation, ventricular tachycardia, high grade AV block, pauses (3 seconds or longer). One episode of SVT, 5 beat duration, max HR of 203 bpm, and average 191 bpm.  Total ventricular ectopic burden <1%. Total supraventricular ectopic burden <1%. Patient triggered events: 6. These did not correlate with any significant dysrhythmias.  LABORATORY DATA: CBC Latest Ref Rng & Units 02/08/2021 10/05/2020 09/11/2020  WBC 3.4 - 10.8 x10E3/uL 10.9(H) 9.1 -  Hemoglobin 11.1 - 15.9 g/dL 10.2(L) 11.7(L) 15.6(H)  Hematocrit 34.0 - 46.6 % 33.7(L) 38.6 46.0  Platelets 150 - 450 x10E3/uL 397 258 -    CMP Latest Ref Rng & Units 02/08/2021 10/05/2020 09/11/2020  Glucose 70 - 99 mg/dL 96 134(H) 107(H)  BUN 8 - 27 mg/dL 10 10 6(L)  Creatinine 0.57 - 1.00 mg/dL 0.77 0.72 0.60  Sodium 134 - 144 mmol/L 139 139 139  Potassium 3.5 - 5.2 mmol/L 4.3 4.2 3.7  Chloride 96 - 106 mmol/L 97 99 100  CO2 20 - 29 mmol/L 24 32 -  Calcium 8.7 - 10.3 mg/dL 9.4 9.8 -  Total Protein 6.0 - 8.5 g/dL 7.5 7.4 -  Total Bilirubin 0.0 - 1.2 mg/dL <0.2 <0.2(L) -  Alkaline Phos 44 - 121 IU/L 88 80 -  AST 0 - 40 IU/L 20 14(L) -  ALT 0 - 32 IU/L 18 16 -    Lipid Panel     Component Value Date/Time   CHOL 215 (H) 02/08/2021 1200   TRIG 240 (H) 02/08/2021 1200   HDL 72 02/08/2021 1200   CHOLHDL 3.0 02/08/2021 1200   LDLCALC 103 (H) 02/08/2021 1200   LABVLDL 40 02/08/2021 1200    No components found for: NTPROBNP No results for input(s): PROBNP in the last  8760 hours. No results for input(s): TSH in the last 8760 hours.  BMP Recent Labs  09/11/20 1349 09/11/20 1353 10/05/20 1141 02/08/21 1200  NA 136 139 139 139  K 3.9 3.7 4.2 4.3  CL 99 100 99 97  CO2 22  --  32 24  GLUCOSE 102* 107* 134* 96  BUN 7* 6* 10 10  CREATININE 0.70 0.60 0.72 0.77  CALCIUM 9.4  --  9.8 9.4  GFRNONAA >60  --  >60  --     HEMOGLOBIN A1C Lab Results  Component Value Date   HGBA1C 6.6 (H) 02/08/2021     IMPRESSION:    ICD-10-CM   1. Cardiomyopathy, unspecified type (HCC)  I42.9 metoprolol succinate (TOPROL XL) 50 MG 24 hr tablet    Basic metabolic panel    Pro b natriuretic peptide (BNP)    Magnesium    2. Hx of syncope  Z87.898     3. OSA (obstructive sleep apnea)  G47.33     4. Benign hypertension  I10     5. Pulmonary nodule  R91.1        RECOMMENDATIONS: JOPLIN CANTY is a 60 y.o. female whose past medical history and cardiac risk factors include: Cardiomyopathy, history of COVID-19 infection, TIA, HTN, OSA (not on CPAP yet dx in June 2022), fibromyalgia, history of breast cancer bilaterally status post chemotherapy and radiation, former smoker, obesity due to excess calories.  Cardiomyopathy, unspecified type ([place]) During her work-up of syncope she underwent an echocardiogram which noted mildly reduced LVEF. Clinically not in congestive heart failure. Suspect her underlying cardiomyopathy is due to her prior chemotherapy and radiation for breast cancer. Nuclear stress test overall low risk. Would like to uptitrate GDMT. Tolerated initiation of losartan and spironolactone well. Discontinue hydrochlorothiazide Discontinue propranolol 20 mg p.o. twice daily, will start Toprol-XL 50 mg p.o. every morning Labs prior to next office visit. With hopes of transitioning her from losartan to [facility].  Syncope and collapse Index event May 2022. No reoccurrence of syncope. Most likely secondary to vasovagal etiology. CT PE protocol was  negative for pulmonary embolism. Echocardiogram, stress test, results reviewed 14-day extended Holter monitor results reviewed during today's encounter. Has undergone ENT evaluation as well due to worsening symptoms suggestive of BPPV.  However patient states that she has not been diagnosed with vertigo. Per patient, she follows up with neurology as well.  Pulmonary nodule CTA PE study 11/11/2020 LLL ~30mm.  Former smoker, last cig 7 years ago.  Awaiting pulmonary appt she will discuss it further with PCP.   OSA (obstructive sleep apnea) Diagnosed in June 2022..   Started on CPAP machine November 2022.   Benign hypertension : Medication changes as discussed above.  FINAL MEDICATION LIST END OF ENCOUNTER: Meds ordered this encounter  Medications   metoprolol succinate (TOPROL XL) 50 MG 24 hr tablet    Sig: Take 1 tablet (50 mg total) by mouth every morning.    Dispense:  90 tablet    Refill:  0     Medications Discontinued During This Encounter  Medication Reason   cyclobenzaprine (FLEXERIL) 10 MG tablet Duplicate   hydrochlorothiazide (HYDRODIURIL) 25 MG tablet Patient Preference   propranolol (INDERAL) 20 MG tablet Change in therapy     Current Outpatient Medications:    acetaminophen (TYLENOL) 650 MG CR tablet, Take 650 mg by mouth every 8 (eight) hours as needed for pain., Disp: , Rfl:    albuterol (VENTOLIN HFA) 108 (90 Base) MCG/ACT inhaler, INHALE 1-2 PUFFS INTO THE LUNGS EVERY 6 (SIX) HOURS AS NEEDED FOR WHEEZING OR SHORTNESS OF  BREATH., Disp: 6.7 each, Rfl: 1   aspirin-acetaminophen-caffeine (EXCEDRIN MIGRAINE) 250-250-65 MG tablet, Take 1 tablet by mouth every 6 (six) hours as needed for headache., Disp: , Rfl:    Bisacodyl (DULCOLAX PO), Take 1 capsule by mouth daily as needed (constipation). , Disp: , Rfl:    cholecalciferol 5000 units TABS, Take 1 tablet (5,000 Units total) by mouth 2 (two) times daily. (Patient taking differently: Take 5,000 Units by mouth daily.),  Disp: , Rfl:    cyclobenzaprine (FLEXERIL) 10 MG tablet, Take 1 tablet by mouth daily as needed., Disp: , Rfl:    dexlansoprazole (DEXILANT) 60 MG capsule, TAKE 1 CAPSULE BY MOUTH EVERY DAY, Disp: 90 capsule, Rfl: 1   diphenhydrAMINE (BENADRYL) 25 MG tablet, Take 1 tablet (25 mg total) by mouth every 6 (six) hours as needed for up to 20 doses (migraine). Take with reglan for migraine headache, Disp: 20 tablet, Rfl: 0   ELDERBERRY PO, Take 1 tablet by mouth daily., Disp: , Rfl:    fish oil-omega-3 fatty acids 1000 MG capsule, Take 1 g by mouth daily., Disp: , Rfl:    Flaxseed, Linseed, (FLAXSEED OIL MAX STR PO), Take 1 capsule by mouth daily. , Disp: , Rfl:    gabapentin (NEURONTIN) 300 MG capsule, TAKE 1 CAPSULE BY MOUTH THREE TIMES A DAY, Disp: 90 capsule, Rfl: 3   Iron-FA-B Cmp-C-Biot-Probiotic (FUSION PLUS) CAPS, Take 1 capsule by mouth daily., Disp: 30 capsule, Rfl: 3   losartan (COZAAR) 25 MG tablet, Take 1 tablet (25 mg total) by mouth daily at 10 pm. Hold if systolic blood pressure (top number) less than 100 mmHg, Disp: 90 tablet, Rfl: 0   meloxicam (MOBIC) 7.5 MG tablet, TAKE 1 TABLET BY MOUTH EVERY DAY, Disp: 90 tablet, Rfl: 0   metoprolol succinate (TOPROL XL) 50 MG 24 hr tablet, Take 1 tablet (50 mg total) by mouth every morning., Disp: 90 tablet, Rfl: 0   Multiple Vitamins-Minerals (MULTIVITAMIN & MINERAL PO), Take 1 tablet by mouth daily., Disp: , Rfl:    omeprazole (PRILOSEC) 20 MG capsule, Take 20 mg by mouth daily., Disp: , Rfl:    ondansetron (ZOFRAN) 4 MG tablet, TAKE 1 TABLET BY MOUTH DAILY AS NEEDED FOR NAUSEA OR VOMITING, Disp: 30 tablet, Rfl: 1   OVER THE COUNTER MEDICATION, Take 125 mg by mouth daily. [place]/Simethicone, Disp: , Rfl:    spironolactone (ALDACTONE) 25 MG tablet, Take 1 tablet (25 mg total) by mouth every morning., Disp: 90 tablet, Rfl: 0   SYMBICORT 80-4.5 MCG/ACT inhaler, INHALE 2 PUFFS INTO THE LUNGS IN THE MORNING AND AT BEDTIME., Disp: 30.6 each, Rfl: 1    triamcinolone cream (KENALOG) 0.1 %, Apply 1 application topically 2 (two) times daily. Apply for up to one week. (Patient taking differently: Apply 1 application topically 2 (two) times daily as needed (rash).), Disp: 30 g, Rfl: 0   Turmeric 500 MG CAPS, Take by mouth., Disp: , Rfl:    venlafaxine XR (EFFEXOR-XR) 150 MG 24 hr capsule, TAKE 1 CAPSULE BY MOUTH NIGHTLY WITH 75 MG CAPSULE (TOTAL OF 225 MG NIGHTLY), Disp: 90 capsule, Rfl: 0   venlafaxine XR (EFFEXOR-XR) 75 MG 24 hr capsule, TAKE 1 CAPSULE BY MOUTH DAILY IN COMBINATION WITH XR 150 MG (TO EQUAL 225 MG TOTAL DAILY)., Disp: 90 capsule, Rfl: 3   vitamin E 200 UNIT capsule, Take 200 Units by mouth daily., Disp: , Rfl:   Orders Placed This Encounter  Procedures   Basic metabolic panel   Pro  b natriuretic peptide (BNP)   Magnesium    There are no Patient Instructions on file for this visit.   --Continue cardiac medications as reconciled in final medication list. --Return in about 3 months (around 06/15/2021) for Follow up cardiomyopathy, labs prior. . Or sooner if needed. --Continue follow-up with your primary care physician regarding the management of your other chronic comorbid conditions.  Patient's questions and concerns were addressed to her satisfaction. She voices understanding of the instructions provided during this encounter.   This note was created using a voice recognition software as a result there may be grammatical errors inadvertently enclosed that do not reflect the nature of this encounter. Every attempt is made to correct such errors.  Rex Kras, [place], [facility]  Pager: [phone] Office: [phone]

## 2021-03-21 ENCOUNTER — Ambulatory Visit: Payer: Medicare Other | Admitting: Cardiology

## 2021-03-23 ENCOUNTER — Other Ambulatory Visit: Payer: Self-pay | Admitting: Cardiology

## 2021-03-23 DIAGNOSIS — I429 Cardiomyopathy, unspecified: Secondary | ICD-10-CM

## 2021-03-24 ENCOUNTER — Telehealth: Payer: Self-pay

## 2021-03-24 DIAGNOSIS — I429 Cardiomyopathy, unspecified: Secondary | ICD-10-CM

## 2021-03-24 NOTE — Telephone Encounter (Signed)
Patient called and stated that her hands and feet have been swelling since she was taken off of the hydrochlorothiazide. She stated her BP has been well controlled but the swelling is still occurring. Pt is confused as to why she was taken off of this medication. I informed her that the spironolactone can also help with fluid but she said she does not feel this is enough. Please advise.

## 2021-03-25 ENCOUNTER — Other Ambulatory Visit: Payer: Self-pay | Admitting: Cardiology

## 2021-03-25 ENCOUNTER — Other Ambulatory Visit: Payer: Self-pay | Admitting: Nurse Practitioner

## 2021-03-25 DIAGNOSIS — G8929 Other chronic pain: Secondary | ICD-10-CM

## 2021-03-25 DIAGNOSIS — R519 Headache, unspecified: Secondary | ICD-10-CM

## 2021-03-25 DIAGNOSIS — I429 Cardiomyopathy, unspecified: Secondary | ICD-10-CM

## 2021-03-25 DIAGNOSIS — F32A Depression, unspecified: Secondary | ICD-10-CM

## 2021-03-25 DIAGNOSIS — Z6841 Body Mass Index (BMI) 40.0 and over, adult: Secondary | ICD-10-CM

## 2021-03-28 MED ORDER — HYDROCHLOROTHIAZIDE 25 MG PO TABS: 12.5000 mg | ORAL_TABLET | Freq: Every day | ORAL | 0 refills | Status: DC

## 2021-03-28 NOTE — Telephone Encounter (Signed)
Patient stated she will try taking half of the hydrochlorothiazide and she will call us and let us know how she feels. Medication updated in [facility].

## 2021-03-29 DIAGNOSIS — H905 Unspecified sensorineural hearing loss: Secondary | ICD-10-CM | POA: Diagnosis not present

## 2021-04-02 ENCOUNTER — Encounter: Payer: Self-pay | Admitting: Cardiology

## 2021-04-03 NOTE — Telephone Encounter (Signed)
From pt

## 2021-04-04 NOTE — Telephone Encounter (Signed)
I spoke to her over the phone.   She was suppose to restart HCTZ at half her original dose as of the telephone note from 12/2 which she has not done. She is asked to make that change and her swelling should improve.   With migraines I would be okay d/c Toprol XL and going back to Propanolol (she was taking in the past). I had started Toprol XL given her LVEF and wanting to uptitrate her GDMT.  Her questions / concerns addressed.   ST

## 2021-04-05 ENCOUNTER — Other Ambulatory Visit: Payer: Self-pay

## 2021-04-05 ENCOUNTER — Emergency Department (HOSPITAL_COMMUNITY)
Admission: EM | Admit: 2021-04-05 | Discharge: 2021-04-06 | Disposition: A | Discharge status: Home / Self Care | Payer: No Typology Code available for payment source | Attending: Emergency Medicine | Admitting: Emergency Medicine

## 2021-04-05 ENCOUNTER — Emergency Department (HOSPITAL_COMMUNITY): Payer: No Typology Code available for payment source

## 2021-04-05 ENCOUNTER — Encounter (HOSPITAL_COMMUNITY): Payer: Self-pay | Admitting: Emergency Medicine

## 2021-04-05 DIAGNOSIS — I2510 Atherosclerotic heart disease of native coronary artery without angina pectoris: Secondary | ICD-10-CM | POA: Diagnosis not present

## 2021-04-05 DIAGNOSIS — Z7982 Long term (current) use of aspirin: Secondary | ICD-10-CM | POA: Insufficient documentation

## 2021-04-05 DIAGNOSIS — M25562 Pain in left knee: Secondary | ICD-10-CM | POA: Insufficient documentation

## 2021-04-05 DIAGNOSIS — R1084 Generalized abdominal pain: Secondary | ICD-10-CM | POA: Diagnosis not present

## 2021-04-05 DIAGNOSIS — Z8616 Personal history of COVID-19: Secondary | ICD-10-CM | POA: Insufficient documentation

## 2021-04-05 DIAGNOSIS — Z853 Personal history of malignant neoplasm of breast: Secondary | ICD-10-CM | POA: Insufficient documentation

## 2021-04-05 DIAGNOSIS — R0789 Other chest pain: Secondary | ICD-10-CM | POA: Insufficient documentation

## 2021-04-05 DIAGNOSIS — R519 Headache, unspecified: Secondary | ICD-10-CM | POA: Diagnosis not present

## 2021-04-05 DIAGNOSIS — Z041 Encounter for examination and observation following transport accident: Secondary | ICD-10-CM | POA: Diagnosis not present

## 2021-04-05 DIAGNOSIS — J9811 Atelectasis: Secondary | ICD-10-CM | POA: Diagnosis not present

## 2021-04-05 DIAGNOSIS — Z87891 Personal history of nicotine dependence: Secondary | ICD-10-CM | POA: Insufficient documentation

## 2021-04-05 DIAGNOSIS — S0990XA Unspecified injury of head, initial encounter: Secondary | ICD-10-CM | POA: Diagnosis not present

## 2021-04-05 DIAGNOSIS — K219 Gastro-esophageal reflux disease without esophagitis: Secondary | ICD-10-CM | POA: Insufficient documentation

## 2021-04-05 DIAGNOSIS — S199XXA Unspecified injury of neck, initial encounter: Secondary | ICD-10-CM | POA: Diagnosis present

## 2021-04-05 DIAGNOSIS — S161XXA Strain of muscle, fascia and tendon at neck level, initial encounter: Secondary | ICD-10-CM | POA: Insufficient documentation

## 2021-04-05 DIAGNOSIS — Y92410 Unspecified street and highway as the place of occurrence of the external cause: Secondary | ICD-10-CM | POA: Insufficient documentation

## 2021-04-05 DIAGNOSIS — K5730 Diverticulosis of large intestine without perforation or abscess without bleeding: Secondary | ICD-10-CM | POA: Diagnosis not present

## 2021-04-05 DIAGNOSIS — I10 Essential (primary) hypertension: Secondary | ICD-10-CM | POA: Insufficient documentation

## 2021-04-05 DIAGNOSIS — K439 Ventral hernia without obstruction or gangrene: Secondary | ICD-10-CM | POA: Diagnosis not present

## 2021-04-05 DIAGNOSIS — Z79899 Other long term (current) drug therapy: Secondary | ICD-10-CM | POA: Diagnosis not present

## 2021-04-05 DIAGNOSIS — M1712 Unilateral primary osteoarthritis, left knee: Secondary | ICD-10-CM | POA: Diagnosis not present

## 2021-04-05 DIAGNOSIS — M47814 Spondylosis without myelopathy or radiculopathy, thoracic region: Secondary | ICD-10-CM | POA: Diagnosis not present

## 2021-04-05 DIAGNOSIS — S299XXA Unspecified injury of thorax, initial encounter: Secondary | ICD-10-CM | POA: Diagnosis not present

## 2021-04-05 DIAGNOSIS — R079 Chest pain, unspecified: Secondary | ICD-10-CM | POA: Diagnosis not present

## 2021-04-05 DIAGNOSIS — S3991XA Unspecified injury of abdomen, initial encounter: Secondary | ICD-10-CM | POA: Diagnosis not present

## 2021-04-05 MED ORDER — ACETAMINOPHEN 325 MG PO TABS
650.0000 mg | ORAL_TABLET | Freq: Once | ORAL | Status: AC
  Administered 2021-04-05: 22:00:00 650 mg via ORAL

## 2021-04-05 NOTE — ED Notes (Signed)
Triage called x2 no response
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
## 2021-04-06 ENCOUNTER — Emergency Department (HOSPITAL_COMMUNITY): Payer: No Typology Code available for payment source

## 2021-04-06 ENCOUNTER — Telehealth: Payer: Self-pay

## 2021-04-06 DIAGNOSIS — K5730 Diverticulosis of large intestine without perforation or abscess without bleeding: Secondary | ICD-10-CM | POA: Diagnosis not present

## 2021-04-06 DIAGNOSIS — M47814 Spondylosis without myelopathy or radiculopathy, thoracic region: Secondary | ICD-10-CM | POA: Diagnosis not present

## 2021-04-06 DIAGNOSIS — S161XXA Strain of muscle, fascia and tendon at neck level, initial encounter: Secondary | ICD-10-CM | POA: Diagnosis not present

## 2021-04-06 DIAGNOSIS — S3991XA Unspecified injury of abdomen, initial encounter: Secondary | ICD-10-CM | POA: Diagnosis not present

## 2021-04-06 DIAGNOSIS — I2510 Atherosclerotic heart disease of native coronary artery without angina pectoris: Secondary | ICD-10-CM | POA: Diagnosis not present

## 2021-04-06 DIAGNOSIS — S299XXA Unspecified injury of thorax, initial encounter: Secondary | ICD-10-CM | POA: Diagnosis not present

## 2021-04-06 DIAGNOSIS — K439 Ventral hernia without obstruction or gangrene: Secondary | ICD-10-CM | POA: Diagnosis not present

## 2021-04-06 DIAGNOSIS — J9811 Atelectasis: Secondary | ICD-10-CM | POA: Diagnosis not present

## 2021-04-06 LAB — CBC WITH DIFFERENTIAL/PLATELET
Abs Immature Granulocytes: 0.04 10*3/uL (ref 0.00–0.07)
Basophils Absolute: 0 10*3/uL (ref 0.0–0.1)
Basophils Relative: 0 %
Eosinophils Absolute: 0.2 10*3/uL (ref 0.0–0.5)
Eosinophils Relative: 2 %
HCT: 31.8 % — ABNORMAL LOW (ref 36.0–46.0)
Hemoglobin: 9.1 g/dL — ABNORMAL LOW (ref 12.0–15.0)
Immature Granulocytes: 0 %
Lymphocytes Relative: 22 %
Lymphs Abs: 2.6 10*3/uL (ref 0.7–4.0)
MCH: 22.5 pg — ABNORMAL LOW (ref 26.0–34.0)
MCHC: 28.6 g/dL — ABNORMAL LOW (ref 30.0–36.0)
MCV: 78.5 fL — ABNORMAL LOW (ref 80.0–100.0)
Monocytes Absolute: 0.8 10*3/uL (ref 0.1–1.0)
Monocytes Relative: 7 %
Neutro Abs: 8.4 10*3/uL — ABNORMAL HIGH (ref 1.7–7.7)
Neutrophils Relative %: 69 %
Platelets: 361 10*3/uL (ref 150–400)
RBC: 4.05 MIL/uL (ref 3.87–5.11)
RDW: 17.4 % — ABNORMAL HIGH (ref 11.5–15.5)
WBC: 12 10*3/uL — ABNORMAL HIGH (ref 4.0–10.5)
nRBC: 0 % (ref 0.0–0.2)

## 2021-04-06 LAB — BASIC METABOLIC PANEL
Anion gap: 7 (ref 5–15)
BUN: 7 mg/dL — ABNORMAL LOW (ref 8–23)
CO2: 29 mmol/L (ref 22–32)
Calcium: 8.6 mg/dL — ABNORMAL LOW (ref 8.9–10.3)
Chloride: 99 mmol/L (ref 98–111)
Creatinine, Ser: 0.63 mg/dL (ref 0.44–1.00)
GFR, Estimated: >60 mL/min (ref >60)
Glucose, Bld: 107 mg/dL — ABNORMAL HIGH (ref 70–99)
Potassium: 3.7 mmol/L (ref 3.5–5.1)
Sodium: 135 mmol/L (ref 135–145)

## 2021-04-06 MED ORDER — OXYCODONE-ACETAMINOPHEN 5-325 MG PO TABS
1.0000 | ORAL_TABLET | Freq: Once | ORAL | Status: AC
  Administered 2021-04-06: 1 via ORAL
  Filled 2021-04-06: qty 1

## 2021-04-06 MED ORDER — IOHEXOL 300 MG/ML  SOLN
100.0000 mL | Freq: Once | INTRAMUSCULAR | Status: AC | PRN
  Administered 2021-04-06: 100 mL via INTRAVENOUS

## 2021-04-06 NOTE — Telephone Encounter (Signed)
Left pt vm to give the office a call back. Pt visited ED on 04/06/2021. Pt notified in vm if needing an apt to give the office a call back.

## 2021-04-06 NOTE — Discharge Instructions (Addendum)
Please continue to take your prescribed cyclobenzaprine as needed for management of your symptoms.  Please follow-up with your regular doctor.  If you develop any new or worsening symptoms please come back to the emergency department immediately.

## 2021-04-06 NOTE — Progress Notes (Signed)
Orthopedic Tech Progress Note Patient Details:  LYSBETH DICOLA 02-18-1961 [id number]  Ortho Devices Type of Ortho Device: Crutches, Knee Immobilizer Ortho Device/Splint Location: LLE Ortho Device/Splint Interventions: Ordered, Application, Adjustment   Post Interventions Patient Tolerated: Well Instructions Provided: Adjustment of device, Care of device, Poper ambulation with device  Briony Parveen 04/06/2021, 6:07 AM
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
## 2021-04-06 NOTE — ED Notes (Signed)
Ortho Tech here for care.

## 2021-04-06 NOTE — ED Notes (Signed)
Called orthotech for care

## 2021-04-07 ENCOUNTER — Encounter: Payer: Self-pay | Admitting: Orthopaedic Surgery

## 2021-04-07 ENCOUNTER — Encounter: Payer: Self-pay | Admitting: Nurse Practitioner

## 2021-04-07 NOTE — Telephone Encounter (Signed)
Sounds like needs an appointment

## 2021-04-07 NOTE — Telephone Encounter (Signed)
Can you please call pt to schedule?

## 2021-04-10 ENCOUNTER — Ambulatory Visit: Payer: Self-pay

## 2021-04-10 ENCOUNTER — Ambulatory Visit (INDEPENDENT_AMBULATORY_CARE_PROVIDER_SITE_OTHER): Payer: Medicare Other | Admitting: Physician Assistant

## 2021-04-10 ENCOUNTER — Other Ambulatory Visit: Payer: Self-pay

## 2021-04-10 ENCOUNTER — Encounter: Payer: Self-pay | Admitting: Nurse Practitioner

## 2021-04-10 ENCOUNTER — Encounter: Payer: Self-pay | Admitting: Physician Assistant

## 2021-04-10 ENCOUNTER — Ambulatory Visit: Payer: Medicare Other | Admitting: Physician Assistant

## 2021-04-10 DIAGNOSIS — M79642 Pain in left hand: Secondary | ICD-10-CM | POA: Diagnosis not present

## 2021-04-10 MED ORDER — OXYCODONE-ACETAMINOPHEN 5-325 MG PO TABS: 1.0000 | ORAL_TABLET | Freq: Four times a day (QID) | ORAL | 0 refills | Status: DC | PRN
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
## 2021-04-11 ENCOUNTER — Ambulatory Visit: Payer: Medicare Other | Admitting: Nurse Practitioner

## 2021-04-11 DIAGNOSIS — C50912 Malignant neoplasm of unspecified site of left female breast: Secondary | ICD-10-CM | POA: Diagnosis not present

## 2021-04-18 ENCOUNTER — Encounter: Payer: Self-pay | Admitting: Orthopaedic Surgery

## 2021-04-18 ENCOUNTER — Ambulatory Visit: Payer: Self-pay

## 2021-04-18 ENCOUNTER — Ambulatory Visit (INDEPENDENT_AMBULATORY_CARE_PROVIDER_SITE_OTHER): Payer: Medicare Other | Admitting: Orthopaedic Surgery

## 2021-04-18 ENCOUNTER — Other Ambulatory Visit: Payer: Self-pay

## 2021-04-18 ENCOUNTER — Ambulatory Visit (INDEPENDENT_AMBULATORY_CARE_PROVIDER_SITE_OTHER): Payer: Medicare Other

## 2021-04-18 ENCOUNTER — Encounter: Payer: Self-pay | Admitting: Nurse Practitioner

## 2021-04-18 DIAGNOSIS — S62623A Displaced fracture of medial phalanx of left middle finger, initial encounter for closed fracture: Secondary | ICD-10-CM

## 2021-04-18 DIAGNOSIS — M79642 Pain in left hand: Secondary | ICD-10-CM

## 2021-04-18 DIAGNOSIS — S82142D Displaced bicondylar fracture of left tibia, subsequent encounter for closed fracture with routine healing: Secondary | ICD-10-CM

## 2021-04-18 DIAGNOSIS — S82142A Displaced bicondylar fracture of left tibia, initial encounter for closed fracture: Secondary | ICD-10-CM | POA: Insufficient documentation

## 2021-04-18 DIAGNOSIS — M25562 Pain in left knee: Secondary | ICD-10-CM

## 2021-04-18 DIAGNOSIS — S62629A Displaced fracture of medial phalanx of unspecified finger, initial encounter for closed fracture: Secondary | ICD-10-CM | POA: Insufficient documentation

## 2021-04-18 DIAGNOSIS — S62629D Displaced fracture of medial phalanx of unspecified finger, subsequent encounter for fracture with routine healing: Secondary | ICD-10-CM
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
## 2021-04-19 ENCOUNTER — Other Ambulatory Visit: Payer: Self-pay | Admitting: Nurse Practitioner

## 2021-04-19 ENCOUNTER — Telehealth: Payer: Self-pay | Admitting: Orthopaedic Surgery

## 2021-04-19 ENCOUNTER — Other Ambulatory Visit: Payer: Self-pay | Admitting: Hematology and Oncology

## 2021-04-19 ENCOUNTER — Encounter: Payer: Self-pay | Admitting: Orthopaedic Surgery

## 2021-04-19 DIAGNOSIS — M797 Fibromyalgia: Secondary | ICD-10-CM

## 2021-04-19 NOTE — Telephone Encounter (Signed)
Pt called requesting a script for shower bench. Pt is asking to call her to let her know where the script is sent to for pick up. Pt phone number is [phone].

## 2021-04-20 NOTE — Telephone Encounter (Signed)
Called and spoke with patient. Faxed script to [facility].

## 2021-04-25 ENCOUNTER — Telehealth: Payer: Self-pay | Admitting: Orthopaedic Surgery

## 2021-04-25 ENCOUNTER — Ambulatory Visit: Payer: Medicare Other | Admitting: Orthopaedic Surgery

## 2021-04-25 ENCOUNTER — Other Ambulatory Visit: Payer: Self-pay | Admitting: Physician Assistant

## 2021-04-25 MED ORDER — OXYCODONE-ACETAMINOPHEN 5-325 MG PO TABS: 1.0000 | ORAL_TABLET | Freq: Two times a day (BID) | ORAL | 0 refills | Status: DC | PRN

## 2021-04-25 NOTE — Telephone Encounter (Signed)
Patient called. She would like some pain medication called in for her. Her call back number is [phone]

## 2021-04-25 NOTE — Telephone Encounter (Signed)
Please check.

## 2021-04-27 ENCOUNTER — Other Ambulatory Visit: Payer: Self-pay

## 2021-04-27 ENCOUNTER — Ambulatory Visit (INDEPENDENT_AMBULATORY_CARE_PROVIDER_SITE_OTHER): Payer: Commercial Managed Care - HMO | Admitting: Nurse Practitioner

## 2021-04-27 ENCOUNTER — Encounter: Payer: Self-pay | Admitting: Nurse Practitioner

## 2021-04-27 VITALS — BP 118/80 | HR 95 | Temp 98.3°F | Ht 61.0 in | Wt 277.4 lb

## 2021-04-27 DIAGNOSIS — Z974 Presence of external hearing-aid: Secondary | ICD-10-CM | POA: Diagnosis not present

## 2021-04-27 DIAGNOSIS — M25562 Pain in left knee: Secondary | ICD-10-CM

## 2021-04-27 DIAGNOSIS — M546 Pain in thoracic spine: Secondary | ICD-10-CM | POA: Diagnosis not present

## 2021-04-27 DIAGNOSIS — Z6841 Body Mass Index (BMI) 40.0 and over, adult: Secondary | ICD-10-CM | POA: Diagnosis not present

## 2021-04-27 MED ORDER — CYCLOBENZAPRINE HCL 10 MG PO TABS: 10.0000 mg | ORAL_TABLET | Freq: Three times a day (TID) | ORAL | 1 refills | Status: DC | PRN

## 2021-04-27 MED ORDER — ONDANSETRON HCL 4 MG PO TABS: 4.0000 mg | ORAL_TABLET | Freq: Three times a day (TID) | ORAL | 1 refills | Status: DC | PRN
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
## 2021-04-27 NOTE — Progress Notes (Deleted)
[name],acting as a Education administrator for [name], FNP.,have documented all relevant documentation on the behalf of [name], FNP,as directed by  [name], FNP while in the presence of [name], [name].  This visit occurred during the SARS-CoV-2 public health emergency.  Safety protocols were in place, including screening questions prior to the visit, additional usage of staff PPE, and extensive cleaning of exam room while observing appropriate contact time as indicated for disinfecting solutions.  Subjective:     Patient ID: Ashley Lindsey , female    DOB: 1960-08-22 , 61 y.o.   MRN: [id number]   No chief complaint on file.   HPI  HPI   Past Medical History:  Diagnosis Date   Abdominal cramps    Arthritis    Cancer ([place])    s/p radiation- last dose 6/12//has been off Tamoxifen 8/12   Chronic headaches    Costochondritis    Fatigue    Fibromyalgia 03/06/2013   History of COVID-19    Hypertension    Lymphedema    RIGHT ARM-////  STATES USE LEFT ARM FOR BP'S   Migraines    Muscle spasms of head and/or neck    following to the breast   Passed out    4th of july   Personal history of radiation therapy 2010   lt breast    S/P radiation therapy 08/19/07 - 10/10/07   Left Breast/5040 cGy/28 fractions with Boost for a toatl dose of 6300 dGy   S/P radiation therapy 08/14/10 -10/03/10   right breast   Sleep apnea    STOP BANG SCORE 5     Family History  Problem Relation Age of Onset   Breast cancer Mother 94   Heart disease Father    Cancer Maternal Aunt 63       d. from "female cancer" possibly cervical   Breast cancer Maternal Aunt 60   Prostate cancer Maternal Uncle 78   Prostate cancer Maternal Uncle 80   Prostate cancer Paternal Uncle    Prostate cancer Paternal Uncle    Prostate cancer Maternal Grandfather 44   Breast cancer Cousin 42       mat 1st cousin     Current Outpatient Medications:    acetaminophen (TYLENOL)  650 MG CR tablet, Take 650 mg by mouth every 8 (eight) hours as needed for pain., Disp: , Rfl:    albuterol (VENTOLIN HFA) 108 (90 Base) MCG/ACT inhaler, INHALE 1-2 PUFFS INTO THE LUNGS EVERY 6 (SIX) HOURS AS NEEDED FOR WHEEZING OR SHORTNESS OF BREATH., Disp: 6.7 each, Rfl: 1   aspirin-acetaminophen-caffeine (EXCEDRIN MIGRAINE) 250-250-65 MG tablet, Take 1 tablet by mouth every 6 (six) hours as needed for headache., Disp: , Rfl:    Bisacodyl (DULCOLAX PO), Take 1 capsule by mouth daily as needed (constipation). , Disp: , Rfl:    cholecalciferol 5000 units TABS, Take 1 tablet (5,000 Units total) by mouth 2 (two) times daily. (Patient taking differently: Take 5,000 Units by mouth daily.), Disp: , Rfl:    cyclobenzaprine (FLEXERIL) 10 MG tablet, Take 1 tablet by mouth daily as needed., Disp: , Rfl:    dexlansoprazole (DEXILANT) 60 MG capsule, TAKE 1 CAPSULE BY MOUTH EVERY DAY, Disp: 90 capsule, Rfl: 1   diphenhydrAMINE (BENADRYL) 25 MG tablet, Take 1 tablet (25 mg total) by mouth every 6 (six) hours as needed for up to 20 doses (migraine). Take with reglan for migraine headache, Disp: 20 tablet, Rfl: 0   ELDERBERRY PO, Take  1 tablet by mouth daily., Disp: , Rfl:    fish oil-omega-3 fatty acids 1000 MG capsule, Take 1 g by mouth daily., Disp: , Rfl:    Flaxseed, Linseed, (FLAXSEED OIL MAX STR PO), Take 1 capsule by mouth daily. , Disp: , Rfl:    gabapentin (NEURONTIN) 300 MG capsule, TAKE 1 CAPSULE BY MOUTH THREE TIMES A DAY, Disp: 90 capsule, Rfl: 3   hydrochlorothiazide (HYDRODIURIL) 25 MG tablet, Take 0.5 tablets (12.5 mg total) by mouth daily., Disp: 90 tablet, Rfl: 0   Iron-FA-B Cmp-C-Biot-Probiotic (FUSION PLUS) CAPS, Take 1 capsule by mouth daily., Disp: 30 capsule, Rfl: 3   losartan (COZAAR) 25 MG tablet, TAKE 1 TABLET BY MOUTH DAILY AT 10 PM HOLD IF SYSTOLIC BLOOD PRESSURE(TOP NUMBER) LESS THAN 100 MMHG, Disp: 90 tablet, Rfl: 0   meloxicam (MOBIC) 7.5 MG tablet, TAKE 1 TABLET BY  MOUTH EVERY DAY, Disp: 90 tablet, Rfl: 0   metoprolol succinate (TOPROL XL) 50 MG 24 hr tablet, Take 1 tablet (50 mg total) by mouth every morning., Disp: 90 tablet, Rfl: 0   Multiple Vitamins-Minerals (MULTIVITAMIN & MINERAL PO), Take 1 tablet by mouth daily., Disp: , Rfl:    omeprazole (PRILOSEC) 20 MG capsule, Take 20 mg by mouth daily., Disp: , Rfl:    ondansetron (ZOFRAN) 4 MG tablet, TAKE 1 TABLET BY MOUTH DAILY AS NEEDED FOR NAUSEA OR VOMITING, Disp: 30 tablet, Rfl: 1   OVER THE COUNTER MEDICATION, Take 125 mg by mouth daily. [place]igas/Simethicone, Disp: , Rfl:    oxyCODONE-acetaminophen (PERCOCET/ROXICET) 5-325 MG tablet, Take 1 tablet by mouth every 12 (twelve) hours as needed for severe pain., Disp: 30 tablet, Rfl: 0   spironolactone (ALDACTONE) 25 MG tablet, TAKE 1 TABLET BY MOUTH EVERY DAY IN THE MORNING, Disp: 90 tablet, Rfl: 0   SYMBICORT 80-4.5 MCG/ACT inhaler, INHALE 2 PUFFS INTO THE LUNGS IN THE MORNING AND AT BEDTIME., Disp: 30.6 each, Rfl: 1   triamcinolone cream (KENALOG) 0.1 %, Apply 1 application topically 2 (two) times daily. Apply for up to one week. (Patient taking differently: Apply 1 application topically 2 (two) times daily as needed (rash).), Disp: 30 g, Rfl: 0   Turmeric 500 MG CAPS, Take by mouth., Disp: , Rfl:    venlafaxine XR (EFFEXOR-XR) 150 MG 24 hr capsule, TAKE 1 CAPSULE BY MOUTH NIGHTLY WITH 75 MG CAPSULE (TOTAL OF 225 MG NIGHTLY), Disp: 90 capsule, Rfl: 0   venlafaxine XR (EFFEXOR-XR) 75 MG 24 hr capsule, TAKE 1 CAPSULE BY MOUTH DAILY IN COMBINATION WITH XR 150 MG (TO EQUAL 225 MG TOTAL DAILY)., Disp: 90 capsule, Rfl: 3   vitamin E 200 UNIT capsule, Take 200 Units by mouth daily., Disp: , Rfl:    Allergies  Allergen Reactions   Aspirin     unknown   Penicillins Hives and Swelling    Has patient had a PCN reaction causing immediate rash, facial/tongue/throat swelling, SOB or lightheadedness with hypotension: Yes Has patient had a PCN reaction  causing severe rash involving mucus membranes or skin necrosis: Yes Has patient had a PCN reaction that required hospitalization Yes Has patient had a PCN reaction occurring within the last 10 years: No If all of the above answers are "NO", then may proceed with Cephalosporin use.      Review of Systems   There were no vitals filed for this visit. There is no height or weight on file to calculate BMI.   Objective:  Physical Exam      Assessment  And Plan:     There are no diagnoses linked to this encounter.    Patient was given opportunity to ask questions. Patient verbalized understanding of the plan and was able to repeat key elements of the plan. All questions were answered to their satisfaction.  Sheppard Evens Ll[place], CMA   I, [place], CMA, have reviewed all documentation for this visit. The documentation on 04/27/21 for the exam, diagnosis, procedures, and orders are all accurate and complete.   IF YOU HAVE BEEN REFERRED TO A SPECIALIST, IT MAY TAKE 1-2 WEEKS TO SCHEDULE/PROCESS THE REFERRAL. IF YOU HAVE NOT HEARD FROM US/SPECIALIST IN TWO WEEKS, PLEASE GIVE [place] A CALL AT [phone].   THE PATIENT IS ENCOURAGED TO PRACTICE SOCIAL DISTANCING DUE TO THE COVID-19 PANDEMIC.

## 2021-05-02 ENCOUNTER — Ambulatory Visit: Payer: Self-pay

## 2021-05-02 ENCOUNTER — Ambulatory Visit (INDEPENDENT_AMBULATORY_CARE_PROVIDER_SITE_OTHER): Payer: Commercial Managed Care - HMO

## 2021-05-02 ENCOUNTER — Ambulatory Visit (INDEPENDENT_AMBULATORY_CARE_PROVIDER_SITE_OTHER): Payer: Commercial Managed Care - HMO | Admitting: Orthopaedic Surgery

## 2021-05-02 ENCOUNTER — Other Ambulatory Visit: Payer: Self-pay

## 2021-05-02 ENCOUNTER — Encounter: Payer: Self-pay | Admitting: Orthopaedic Surgery

## 2021-05-02 VITALS — Ht 61.0 in | Wt 277.0 lb

## 2021-05-02 DIAGNOSIS — M79642 Pain in left hand: Secondary | ICD-10-CM | POA: Diagnosis not present

## 2021-05-02 DIAGNOSIS — M25562 Pain in left knee: Secondary | ICD-10-CM

## 2021-05-02 DIAGNOSIS — S82142D Displaced bicondylar fracture of left tibia, subsequent encounter for closed fracture with routine healing: Secondary | ICD-10-CM
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
## 2021-05-09 ENCOUNTER — Other Ambulatory Visit: Payer: Self-pay | Admitting: Cardiology

## 2021-05-09 DIAGNOSIS — I429 Cardiomyopathy, unspecified: Secondary | ICD-10-CM

## 2021-05-12 ENCOUNTER — Other Ambulatory Visit: Payer: Self-pay | Admitting: Physician Assistant

## 2021-05-12 ENCOUNTER — Telehealth: Payer: Self-pay

## 2021-05-12 NOTE — Telephone Encounter (Signed)
Patient called and would like a refill of oxycodone sent in. Please advise.
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
## 2021-05-16 ENCOUNTER — Ambulatory Visit: Payer: Self-pay | Admitting: Physical Therapy

## 2021-05-16 NOTE — Telephone Encounter (Signed)
Please call patient and ask if she would like to try PT to assist with her walking-ask if she can get to PT or if we need to try home eval

## 2021-05-17 ENCOUNTER — Encounter: Payer: Self-pay | Admitting: Physical Medicine and Rehabilitation

## 2021-05-18 ENCOUNTER — Encounter: Payer: Self-pay | Admitting: Nurse Practitioner

## 2021-05-22 ENCOUNTER — Ambulatory Visit: Payer: Medicare Other | Admitting: Physical Medicine and Rehabilitation

## 2021-05-22 ENCOUNTER — Ambulatory Visit: Payer: Commercial Managed Care - HMO | Admitting: Physician Assistant

## 2021-05-23 ENCOUNTER — Other Ambulatory Visit: Payer: Self-pay

## 2021-05-23 ENCOUNTER — Other Ambulatory Visit: Payer: Self-pay | Admitting: Nurse Practitioner

## 2021-05-23 DIAGNOSIS — S82142D Displaced bicondylar fracture of left tibia, subsequent encounter for closed fracture with routine healing: Secondary | ICD-10-CM

## 2021-05-23 MED ORDER — FLUCONAZOLE 150 MG PO TABS: ORAL_TABLET | ORAL | 1 refills | Status: DC

## 2021-05-26 ENCOUNTER — Ambulatory Visit: Payer: Commercial Managed Care - HMO | Admitting: Physician Assistant

## 2021-05-29 ENCOUNTER — Ambulatory Visit: Payer: Commercial Managed Care - HMO | Admitting: Physical Therapy

## 2021-05-30 ENCOUNTER — Ambulatory Visit (INDEPENDENT_AMBULATORY_CARE_PROVIDER_SITE_OTHER): Payer: Medicare Other

## 2021-05-30 ENCOUNTER — Ambulatory Visit: Payer: Self-pay

## 2021-05-30 ENCOUNTER — Encounter: Payer: Self-pay | Admitting: Physician Assistant

## 2021-05-30 ENCOUNTER — Ambulatory Visit (INDEPENDENT_AMBULATORY_CARE_PROVIDER_SITE_OTHER): Payer: Self-pay | Admitting: Physician Assistant

## 2021-05-30 ENCOUNTER — Other Ambulatory Visit: Payer: Self-pay

## 2021-05-30 DIAGNOSIS — S82142D Displaced bicondylar fracture of left tibia, subsequent encounter for closed fracture with routine healing: Secondary | ICD-10-CM | POA: Diagnosis not present

## 2021-05-30 DIAGNOSIS — S62623D Displaced fracture of medial phalanx of left middle finger, subsequent encounter for fracture with routine healing: Secondary | ICD-10-CM

## 2021-05-30 DIAGNOSIS — S62629D Displaced fracture of medial phalanx of unspecified finger, subsequent encounter for fracture with routine healing: Secondary | ICD-10-CM

## 2021-05-30 NOTE — Progress Notes (Signed)
Office Visit Note   Patient: Ashley Lindsey           Date of Birth: 08/31/1960           MRN: [id number] Visit Date: 05/30/2021              Requested by: [name], [address] [place],  [place] [address] PCP: [name], FNP  Chief Complaint  Patient presents with   Left Ring Finger - Fracture, Follow-up   Left Knee - Fracture, Follow-up      HPI: Patient presents in follow-up today she is now almost 8 weeks status post left knee lateral tibial plateau fracture with advanced arthritis.  She has weaned out of her knee brace.  She is using a cane.  She still has some pain but feels she is improving.  She is scheduled to begin physical therapy but has not done any PT yet.  She is also status post left ring finger fracture.  She says this is much better and she has been using a squeeze ball to improve her intrinsic muscles in her han  She still has some swelling that makes it difficult for her to place her ring back on her finger  Assessment & Plan: Visit Diagnoses:  1. Closed fracture of left tibial plateau with routine healing, subsequent encounter   2. Closed avulsion fracture of middle phalanx of finger with routine healing, subsequent encounter     Plan: Patient will work with physical therapy for the next month.  Follow-up at that time.  She understands that at some point she may need knee replacement surgery given the advanced arthritic but certainly trying to make her knee as strong as possible may delay this.  She will follow-up when she completes physical therapy.  Follow-Up Instructions: Return in about 1 month (around 06/27/2021).   Ortho Exam  Patient is alert, oriented, no adenopathy, well-dressed, normal affect, normal respiratory effort. Examination of her left knee she has no effusion no swelling still has some tenderness to deep palpation over the lateral tibial plateau has some grinding with range of motion Examination of the left ring  finger mild soft tissue swelling but she has good motion minimal tenderness to palpation brisk capillary refill  Imaging: No results found. No images are attached to the encounter.  Labs: Lab Results  Component Value Date   HGBA1C 6.6 (H) 02/08/2021   ESRSEDRATE 48 (H) 08/26/2019   LABURIC 4.8 08/26/2019   REPTSTATUS 06/12/2017 FINAL 06/10/2017   CULT >=100,000 COLONIES/mL ESCHERICHIA COLI (A) 06/10/2017   LABORGA ESCHERICHIA COLI (A) 06/10/2017     Lab Results  Component Value Date   ALBUMIN 4.6 02/08/2021   ALBUMIN 3.4 (L) 10/05/2020   ALBUMIN 4.0 09/11/2020    Lab Results  Component Value Date   MG 2.0 02/17/2020   Lab Results  Component Value Date   VD25OH 32 12/26/2011   VD25OH 55 06/23/2010   VD25OH 35 04/29/2009    No results found for: PREALBUMIN CBC EXTENDED Latest Ref Rng & Units 04/06/2021 02/08/2021 10/05/2020  WBC 4.0 - 10.5 K/uL 12.0(H) 10.9(H) 9.1  RBC 3.87 - 5.11 MIL/uL 4.05 4.19 4.46  HGB 12.0 - 15.0 g/dL 9.1(L) 10.2(L) 11.7(L)  HCT 36.0 - 46.0 % 31.8(L) 33.7(L) 38.6  PLT 150 - 400 K/uL 361 397 258  NEUTROABS 1.7 - 7.7 K/uL 8.4(H) - 6.1  LYMPHSABS 0.7 - 4.0 K/uL 2.6 - 2.3     There is no height  or weight on file to calculate BMI.  Orders:  Orders Placed This Encounter  Procedures   XR Knee 1-2 Views Left   XR Finger Ring Left   No orders of the defined types were placed in this encounter.    Procedures: No procedures performed  Clinical Data: No additional findings.  ROS:  All other systems negative, except as noted in the HPI. Review of Systems  Objective: Vital Signs: There were no vitals taken for this visit.  Specialty Comments:  No specialty comments available.  PMFS History: Patient Active Problem List   Diagnosis Date Noted   Avulsion fracture of middle phalanx of finger 04/18/2021   Tibial plateau fracture, left 04/18/2021   Asymmetrical sensorineural hearing loss 02/02/2021   Bilateral primary osteoarthritis of  knee 02/09/2020   Chronic intermittent hypoxia with obstructive sleep apnea 08/24/2019   Chronic intractable headache 07/14/2019   Morning headache 07/14/2019   Loud snoring 07/14/2019   Super obesity 07/14/2019   Sleeps in sitting position due to orthopnea 07/14/2019   Chest pain due to GERD 07/14/2019   Nocturia more than twice per night 07/14/2019   Unilateral primary osteoarthritis, right knee 05/26/2019   Chronic migraine without aura without status migrainosus, not intractable 05/13/2019   Class 3 severe obesity due to excess calories without serious comorbidity with body mass index (BMI) of 40.0 to 44.9 in adult ([place]) 08/05/2018   Depression 08/05/2018   Chronic nonintractable headache 08/05/2018   Elevated blood-pressure reading without diagnosis of hypertension 06/06/2018   Chronic pain of right ankle 04/24/2018   Breast cancer of lower-outer quadrant of right female breast ([place]) 12/02/2014   Bronchospasm 09/21/2014   Dyspnea 09/21/2014   Chronic pain of right knee 09/21/2014   Fibromyalgia 03/06/2013   Dense breasts 03/06/2013   Mastalgia 03/06/2013   Back pain, lumbosacral 03/06/2013   Lymphedema of arm 03/06/2013   Atypical chest pain 01/06/2013   Breast cancer of upper-inner quadrant of left female breast ([place]) 09/08/2012   Family history of malignant neoplasm of breast 09/08/2012   S/P radiation therapy    History of breast cancer in female 08/13/2011   Past Medical History:  Diagnosis Date   Abdominal cramps    Arthritis    Cancer ([place])    s/p radiation- last dose 6/12//has been off Tamoxifen 8/12   Chronic headaches    Costochondritis    Fatigue    Fibromyalgia 03/06/2013   History of COVID-19    Hypertension    Lymphedema    RIGHT ARM-////  STATES USE LEFT ARM FOR BP'S   Migraines    Muscle spasms of head and/or neck    following to the breast   Passed out    4th of july   Personal history of radiation therapy 2010   lt breast    S/P radiation  therapy 08/19/07 - 10/10/07   Left Breast/5040 cGy/28 fractions with Boost for a toatl dose of 6300 dGy   S/P radiation therapy 08/14/10 -10/03/10   right breast   Sleep apnea    STOP BANG SCORE 5    Family History  Problem Relation Age of Onset   Breast cancer Mother 36   Heart disease Father    Cancer Maternal Aunt 63       d. from "female cancer" possibly cervical   Breast cancer Maternal Aunt 60   Prostate cancer Maternal Uncle 78   Prostate cancer Maternal Uncle 80   Prostate cancer Paternal Uncle  Prostate cancer Paternal Uncle    Prostate cancer Maternal Grandfather 57   Breast cancer Cousin 57       mat 1st cousin    Past Surgical History:  Procedure Laterality Date   ABDOMINAL HYSTERECTOMY     with left salpingooophorectomy   BREAST LUMPECTOMY  2009/2012   LEFT/RIGHT with lymph node dissection   KNEE ARTHROSCOPY     right   OOPHORECTOMY  2013   Social History   Occupational History   Occupation: disability  Tobacco Use   Smoking status: Former    Packs/day: 0.25    Years: 1.00    Pack years: 0.25    Types: Cigarettes   Smokeless tobacco: Never  Vaping Use   Vaping Use: Never used  Substance and Sexual Activity   Alcohol use: Not Currently   Drug use: No   Sexual activity: Not Currently    Birth control/protection: Surgical

## 2021-05-31 ENCOUNTER — Encounter: Payer: Self-pay | Admitting: Physical Therapy

## 2021-05-31 ENCOUNTER — Ambulatory Visit (INDEPENDENT_AMBULATORY_CARE_PROVIDER_SITE_OTHER): Payer: Medicare Other | Admitting: Physical Therapy

## 2021-05-31 DIAGNOSIS — R600 Localized edema: Secondary | ICD-10-CM | POA: Diagnosis not present

## 2021-05-31 DIAGNOSIS — M25562 Pain in left knee: Secondary | ICD-10-CM

## 2021-05-31 DIAGNOSIS — R262 Difficulty in walking, not elsewhere classified: Secondary | ICD-10-CM | POA: Diagnosis not present

## 2021-05-31 DIAGNOSIS — M6281 Muscle weakness (generalized): Secondary | ICD-10-CM
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
## 2021-06-06 ENCOUNTER — Other Ambulatory Visit: Payer: Self-pay | Admitting: Nurse Practitioner

## 2021-06-06 DIAGNOSIS — F32A Depression, unspecified: Secondary | ICD-10-CM

## 2021-06-06 DIAGNOSIS — M797 Fibromyalgia: Secondary | ICD-10-CM

## 2021-06-06 DIAGNOSIS — R519 Headache, unspecified: Secondary | ICD-10-CM

## 2021-06-06 DIAGNOSIS — Z6841 Body Mass Index (BMI) 40.0 and over, adult: Secondary | ICD-10-CM

## 2021-06-07 NOTE — Therapy (Signed)
OUTPATIENT PHYSICAL THERAPY TREATMENT NOTE   Patient Name: Ashley Lindsey MRN: [id number] DOB:March 06, 1961, 61 y.o., female Today's Date: 06/08/2021  PCP: [name], FNP REFERRING PROVIDER: Garald Balding, MD   PT End of Session - 06/08/21 1022     Visit Number 2    Number of Visits 13    Date for PT Re-Evaluation 07/14/21    PT Start Time 1017    PT Stop Time 1058    PT Time Calculation (min) 41 min    Activity Tolerance Patient tolerated treatment well    Behavior During Therapy [facility] for tasks assessed/performed             Past Medical History:  Diagnosis Date   Abdominal cramps    Arthritis    Cancer ([place])    s/p radiation- last dose 6/12//has been off Tamoxifen 8/12   Chronic headaches    Costochondritis    Fatigue    Fibromyalgia 03/06/2013   History of COVID-19    Hypertension    Lymphedema    RIGHT ARM-////  STATES USE LEFT ARM FOR BP'S   Migraines    Muscle spasms of head and/or neck    following to the breast   Passed out    4th of july   Personal history of radiation therapy 2010   lt breast    S/P radiation therapy 08/19/07 - 10/10/07   Left Breast/5040 cGy/28 fractions with Boost for a toatl dose of 6300 dGy   S/P radiation therapy 08/14/10 -10/03/10   right breast   Sleep apnea    STOP BANG SCORE 5   Past Surgical History:  Procedure Laterality Date   ABDOMINAL HYSTERECTOMY     with left salpingooophorectomy   BREAST LUMPECTOMY  2009/2012   LEFT/RIGHT with lymph node dissection   KNEE ARTHROSCOPY     right   OOPHORECTOMY  2013   Patient Active Problem List   Diagnosis Date Noted   Avulsion fracture of middle phalanx of finger 04/18/2021   Tibial plateau fracture, left 04/18/2021   Asymmetrical sensorineural hearing loss 02/02/2021   Bilateral primary osteoarthritis of knee 02/09/2020   Chronic intermittent hypoxia with obstructive sleep apnea 08/24/2019   Chronic intractable headache 07/14/2019   Morning headache 07/14/2019    Loud snoring 07/14/2019   Super obesity 07/14/2019   Sleeps in sitting position due to orthopnea 07/14/2019   Chest pain due to GERD 07/14/2019   Nocturia more than twice per night 07/14/2019   Unilateral primary osteoarthritis, right knee 05/26/2019   Chronic migraine without aura without status migrainosus, not intractable 05/13/2019   Class 3 severe obesity due to excess calories without serious comorbidity with body mass index (BMI) of 40.0 to 44.9 in adult ([place]) 08/05/2018   Depression 08/05/2018   Chronic nonintractable headache 08/05/2018   Elevated blood-pressure reading without diagnosis of hypertension 06/06/2018   Chronic pain of right ankle 04/24/2018   Breast cancer of lower-outer quadrant of right female breast ([place]) 12/02/2014   Bronchospasm 09/21/2014   Dyspnea 09/21/2014   Chronic pain of right knee 09/21/2014   Fibromyalgia 03/06/2013   Dense breasts 03/06/2013   Mastalgia 03/06/2013   Back pain, lumbosacral 03/06/2013   Lymphedema of arm 03/06/2013   Atypical chest pain 01/06/2013   Breast cancer of upper-inner quadrant of left female breast ([place]) 09/08/2012   Family history of malignant neoplasm of breast 09/08/2012   S/P radiation therapy    History of breast cancer in female 08/13/2011  REFERRING DIAG: S82.142D (ICD-10-CM) - Closed fracture of left tibial plateau with routine healing, subsequent encounter   THERAPY DIAG:  Acute pain of left knee   Difficulty in walking, not elsewhere classified   Muscle weakness (generalized)   Localized edema  PERTINENT HISTORY: bilateral breast CA with radiation, h/o lymphadema, HTN, arthritis, fibromyalgia, Covid-19, migraines,  Left 4th finger fx    PRECAUTIONS: Lymphadema precautions  SUBJECTIVE: Pt reporting she is compliant with HEP and probably more.  PAIN:  Are you having pain? Yes NPRS scale: 7/10 Pain location: knee Pain orientation: Left  PAIN TYPE: aching Pain description: burning  Aggravating  factors: standing Relieving factors: heat, elevate in recliner     OBJECTIVE:    DIAGNOSTIC FINDINGS: left tibia fx   PATIENT SURVEYS:  05/31/2021 FOTO 31% (predicted 51%)   MUSCLE LENGTH: 05/31/21: Hamstrings: Right 60 deg; Left 48 deg    LE AROM/PROM:   AROM Right 05/31/2021 Left 05/31/2021  Hip flexion      Hip extension      Hip abduction      Hip adduction      Hip internal rotation      Hip external rotation      Knee flexion 70   85  Knee extension -10 -18  Ankle dorsiflexion      Ankle plantarflexion      Ankle inversion      Ankle eversion       (Blank rows = not tested)   LE MMT:   MMT Right 05/31/2021 Left 05/31/2021  Hip flexion 4+/5 4/5  Hip extension      Hip abduction 4+/5 4/5  Hip adduction 4+/5 4/5  Hip internal rotation      Hip external rotation      Knee flexion 4/5 3/5    Knee extension 4/5 3/5  Ankle dorsiflexion      Ankle plantarflexion      Ankle inversion      Ankle eversion       (Blank rows = not tested)       FUNCTIONAL TESTS:  05/31/21: 5 times sit to stand: 39 seconds with UE support       TODAY'S TREATMENT: 06/08/21  Nustep L3 x 7 min,  SAQ x 10 5 sec hold Seated: LAQ x 10 5 sec hold, heel slides on slider x 10 Long sitting: HS stretch with strap for calf stretch 2x 30 sec, quad sets 5 sec hold x 10, Manual: IASTM to left ant tib and peroneals     PATIENT EDUCATION:  Education details:  HEP Person educated: Patient Education method: Consulting civil engineer, Demonstration, Corporate treasurer cues, Verbal cues, and Handouts Education comprehension: verbalized understanding and returned demonstration     HOME EXERCISE PROGRAM: Access Code: [email or web address] URL: [email or web address] Date: 05/31/2021 Prepared by: Kearney Hard   Exercises Supine Quad Set - 3-4 x daily - 7 x weekly - 10 reps - 5 seconds hold Hooklying Hamstring Stretch with Strap - 3-4 x daily - 7 x weekly - 3 reps - 20 seocnds hold Seated Hamstring Stretch - 3-4 x  daily - 7 x weekly - 3 reps - 20 seconds hold Seated Knee Flexion Extension AROM - 3-4 x daily - 7 x weekly - 10 reps - 3 seconds hold Seated Long Arc Quad - 3-4 x daily - 7 x weekly - 2 sets - 10 reps - 3 seconds hold     ASSESSMENT:   CLINICAL IMPRESSION: Patient presents today with 7/10 pain in the  left lower leg pain. She reports compliance with HEP. Initial trial of IASTM to lower leg with good response, but still very sensitive.    REHAB POTENTIAL: Fair     CLINICAL DECISION MAKING: Evolving/moderate complexity   EVALUATION COMPLEXITY: Moderate     GOALS: Goals reviewed with patient? Yes   SHORT TERM GOALS: GOALS: Goals reviewed with patient? Yes   SHORT TERM GOALS:   STG Name Target Date Goal status  1 Independent with initial HEP Baseline:  06/21/2021 INITIAL  2 Pt will improve her 5  time sit to stand to </= 25 seconds with UE support Baseline:  06/14/2021 INITIAL  3   Baseline:        LONG TERM GOALS:    LTG Name Target Date Goal status  1 Independent with final HEP Baseline: 07/12/2021 INITIAL  2 FOTO score improved to >/= 51% for improved function Baseline: 07/12/2021 INITIAL  3 Pt's left knee flexion improved to >/= 110 for improved function Baseline: 07/12/2021 INITIAL  4 Report pain < 3/10 with amb community surfaces with LRAD.   Baseline: 07/12/2021 INITIAL  5 Pt will be able to perform step up  on curb step modified independently.  Baseline: 07/12/2021 INITIAL                          PLAN: PT FREQUENCY: 2x/week   PT DURATION: 6 weeks   PLANNED INTERVENTIONS: Therapeutic exercises, Therapeutic activity, Neuro Muscular re-education, Balance training, Gait training, Patient/Family education, Joint mobilization, Stair training, Dry Needling, Cryotherapy, Moist heat, Taping, Vasopneumatic device, and Manual therapy   PLAN FOR NEXT SESSION: Nustep, quadsets, LAQ, sit to stand, heel slides, bridges, PROM as tolerated    Derika Eckles, PT 06/08/2021,  3:54 PM

## 2021-06-08 ENCOUNTER — Other Ambulatory Visit: Payer: Self-pay | Admitting: Cardiology

## 2021-06-08 ENCOUNTER — Encounter: Payer: Self-pay | Admitting: Physical Therapy

## 2021-06-08 ENCOUNTER — Ambulatory Visit (INDEPENDENT_AMBULATORY_CARE_PROVIDER_SITE_OTHER): Payer: Medicare Other | Admitting: Physical Therapy

## 2021-06-08 ENCOUNTER — Other Ambulatory Visit: Payer: Self-pay

## 2021-06-08 DIAGNOSIS — R600 Localized edema: Secondary | ICD-10-CM

## 2021-06-08 DIAGNOSIS — R262 Difficulty in walking, not elsewhere classified: Secondary | ICD-10-CM | POA: Diagnosis not present

## 2021-06-08 DIAGNOSIS — M79602 Pain in left arm: Secondary | ICD-10-CM | POA: Diagnosis not present

## 2021-06-08 DIAGNOSIS — M25562 Pain in left knee: Secondary | ICD-10-CM

## 2021-06-08 DIAGNOSIS — M6281 Muscle weakness (generalized): Secondary | ICD-10-CM

## 2021-06-08 DIAGNOSIS — M79601 Pain in right arm: Secondary | ICD-10-CM

## 2021-06-08 DIAGNOSIS — I429 Cardiomyopathy, unspecified: Secondary | ICD-10-CM

## 2021-06-12 ENCOUNTER — Other Ambulatory Visit: Payer: Self-pay | Admitting: Nurse Practitioner

## 2021-06-13 ENCOUNTER — Encounter: Payer: Medicare Other | Admitting: Physical Therapy

## 2021-06-13 NOTE — Therapy (Incomplete)
OUTPATIENT PHYSICAL THERAPY TREATMENT NOTE   Patient Name: Ashley Lindsey MRN: [id number] DOB:[birth year], 61 y.o., female Today's Date: 06/13/2021  PCP: [name], FNP REFERRING PROVIDER: Garald Balding, MD     Past Medical History:  Diagnosis Date   Abdominal cramps    Arthritis    Cancer [facility])    s/p radiation- last dose 6/12//has been off Tamoxifen 8/12   Chronic headaches    Costochondritis    Fatigue    Fibromyalgia 03/06/2013   History of COVID-19    Hypertension    Lymphedema    RIGHT ARM-////  STATES USE LEFT ARM FOR BP'S   Migraines    Muscle spasms of head and/or neck    following to the breast   Passed out    4th of july   Personal history of radiation therapy 2010   lt breast    S/P radiation therapy 08/19/07 - 10/10/07   Left Breast/5040 cGy/28 fractions with Boost for a toatl dose of 6300 dGy   S/P radiation therapy 08/14/10 -10/03/10   right breast   Sleep apnea    STOP BANG SCORE 5   Past Surgical History:  Procedure Laterality Date   ABDOMINAL HYSTERECTOMY     with left salpingooophorectomy   BREAST LUMPECTOMY  2009/2012   LEFT/RIGHT with lymph node dissection   KNEE ARTHROSCOPY     right   OOPHORECTOMY  2013   Patient Active Problem List   Diagnosis Date Noted   Avulsion fracture of middle phalanx of finger 04/18/2021   Tibial plateau fracture, left 04/18/2021   Asymmetrical sensorineural hearing loss 02/02/2021   Bilateral primary osteoarthritis of knee 02/09/2020   Chronic intermittent hypoxia with obstructive sleep apnea 08/24/2019   Chronic intractable headache 07/14/2019   Morning headache 07/14/2019   Loud snoring 07/14/2019   Super obesity 07/14/2019   Sleeps in sitting position due to orthopnea 07/14/2019   Chest pain due to GERD 07/14/2019   Nocturia more than twice per night 07/14/2019   Unilateral primary osteoarthritis, right knee 05/26/2019   Chronic migraine without aura without status migrainosus, not intractable  05/13/2019   Class 3 severe obesity due to excess calories without serious comorbidity with body mass index (BMI) of 40.0 to 44.9 in adult ([place]) 08/05/2018   Depression 08/05/2018   Chronic nonintractable headache 08/05/2018   Elevated blood-pressure reading without diagnosis of hypertension 06/06/2018   Chronic pain of right ankle 04/24/2018   Breast cancer of lower-outer quadrant of right female breast ([place]) 12/02/2014   Bronchospasm 09/21/2014   Dyspnea 09/21/2014   Chronic pain of right knee 09/21/2014   Fibromyalgia 03/06/2013   Dense breasts 03/06/2013   Mastalgia 03/06/2013   Back pain, lumbosacral 03/06/2013   Lymphedema of arm 03/06/2013   Atypical chest pain 01/06/2013   Breast cancer of upper-inner quadrant of left female breast ([place]) 09/08/2012   Family history of malignant neoplasm of breast 09/08/2012   S/P radiation therapy    History of breast cancer in female 08/13/2011    REFERRING DIAG: S82.142D (ICD-10-CM) - Closed fracture of left tibial plateau with routine healing, subsequent encounter   THERAPY DIAG:  Acute pain of left knee   Difficulty in walking, not elsewhere classified   Muscle weakness (generalized)   Localized edema  PERTINENT HISTORY: bilateral breast CA with radiation, h/o lymphadema, HTN, arthritis, fibromyalgia, Covid-19, migraines,  Left 4th finger fx    PRECAUTIONS: Lymphadema precautions  SUBJECTIVE: Pt reporting she is compliant with HEP and probably  more.  PAIN:  Are you having pain? Yes NPRS scale: 7/10 Pain location: knee Pain orientation: Left  PAIN TYPE: aching Pain description: burning  Aggravating factors: standing Relieving factors: heat, elevate in recliner     OBJECTIVE:    DIAGNOSTIC FINDINGS: left tibia fx   PATIENT SURVEYS:  05/31/2021 FOTO 31% (predicted 51%)   MUSCLE LENGTH: 05/31/21: Hamstrings: Right 60 deg; Left 48 deg    LE AROM/PROM:   AROM Right 05/31/2021 Left 05/31/2021  Knee flexion 70   85   Knee extension -10 -18   (Blank rows = not tested)     PROM Right 06/13/2021 Left 06/13/2021  Knee flexion      Knee extension       LE MMT:   MMT Right 05/31/2021 Left 05/31/2021  Hip flexion 4+/5 4/5  Hip extension      Hip abduction 4+/5 4/5  Hip adduction 4+/5 4/5  Hip internal rotation      Hip external rotation      Knee flexion 4/5 3/5    Knee extension 4/5 3/5   (Blank rows = not tested)       FUNCTIONAL TESTS:  05/31/21: 5 times sit to stand: 39 seconds with UE support    Therapeutic Exercise: 06/13/2021  Aerobic: Supine: Prone:  Seated:  Standing: Neuromuscular Re-education: Manual Therapy: Therapeutic Activity: Self Care: Trigger Point Dry Needling:  Modalities:      TODAY'S TREATMENT: 06/08/21  Nustep L3 x 7 min,  SAQ x 10 5 sec hold Seated: LAQ x 10 5 sec hold, heel slides on slider x 10 Long sitting: HS stretch with strap for calf stretch 2x 30 sec, quad sets 5 sec hold x 10, Manual: IASTM to left ant tib and peroneals     PATIENT EDUCATION:  Education details:  HEP Person educated: Patient Education method: Consulting civil engineer, Demonstration, Corporate treasurer cues, Verbal cues, and Handouts Education comprehension: verbalized understanding and returned demonstration     HOME EXERCISE PROGRAM: Access Code: [email or web address] URL: [email or web address] Date: 05/31/2021 Prepared by: Kearney Hard   Exercises Supine Quad Set - 3-4 x daily - 7 x weekly - 10 reps - 5 seconds hold Hooklying Hamstring Stretch with Strap - 3-4 x daily - 7 x weekly - 3 reps - 20 seocnds hold Seated Hamstring Stretch - 3-4 x daily - 7 x weekly - 3 reps - 20 seconds hold Seated Knee Flexion Extension AROM - 3-4 x daily - 7 x weekly - 10 reps - 3 seconds hold Seated Long Arc Quad - 3-4 x daily - 7 x weekly - 2 sets - 10 reps - 3 seconds hold     ASSESSMENT:   CLINICAL IMPRESSION: Patient presents today with 7/10 pain in the left lower leg pain. She reports compliance  with HEP. Initial trial of IASTM to lower leg with good response, but still very sensitive.    REHAB POTENTIAL: Fair     CLINICAL DECISION MAKING: Evolving/moderate complexity   EVALUATION COMPLEXITY: Moderate     GOALS: Goals reviewed with patient? Yes   SHORT TERM GOALS: GOALS: Goals reviewed with patient? Yes   SHORT TERM GOALS:   STG Name Target Date Goal status  1 Independent with initial HEP Baseline:  06/21/2021 INITIAL  2 Pt will improve her 5  time sit to stand to </= 25 seconds with UE support Baseline:  06/14/2021 INITIAL  3   Baseline:        LONG TERM GOALS:    LTG Name  Target Date Goal status  1 Independent with final HEP Baseline: 07/12/2021 INITIAL  2 FOTO score improved to >/= 51% for improved function Baseline: 07/12/2021 INITIAL  3 Pt's left knee flexion improved to >/= 110 for improved function Baseline: 07/12/2021 INITIAL  4 Report pain < 3/10 with amb community surfaces with LRAD.   Baseline: 07/12/2021 INITIAL  5 Pt will be able to perform step up  on curb step modified independently.  Baseline: 07/12/2021 INITIAL                          PLAN: PT FREQUENCY: 2x/week   PT DURATION: 6 weeks   PLANNED INTERVENTIONS: Therapeutic exercises, Therapeutic activity, Neuro Muscular re-education, Balance training, Gait training, Patient/Family education, Joint mobilization, Stair training, Dry Needling, Cryotherapy, Moist heat, Taping, Vasopneumatic device, and Manual therapy   PLAN FOR NEXT SESSION: Nustep, quadsets, LAQ, sit to stand, heel slides, bridges, PROM as tolerated    Oretha Caprice, PT, MPT 06/13/2021, 8:24 AM

## 2021-06-15 ENCOUNTER — Encounter: Payer: Self-pay | Admitting: Cardiology

## 2021-06-15 ENCOUNTER — Ambulatory Visit: Payer: Medicare Other | Admitting: Cardiology

## 2021-06-15 ENCOUNTER — Encounter: Payer: Medicare Other | Admitting: Rehabilitative and Restorative Service Providers"

## 2021-06-15 ENCOUNTER — Other Ambulatory Visit: Payer: Self-pay

## 2021-06-15 VITALS — BP 120/77 | HR 95 | Temp 98.2°F | Resp 16 | Ht 61.0 in | Wt 281.4 lb

## 2021-06-15 DIAGNOSIS — I10 Essential (primary) hypertension: Secondary | ICD-10-CM | POA: Diagnosis not present

## 2021-06-15 DIAGNOSIS — G4733 Obstructive sleep apnea (adult) (pediatric): Secondary | ICD-10-CM

## 2021-06-15 DIAGNOSIS — R911 Solitary pulmonary nodule: Secondary | ICD-10-CM

## 2021-06-15 DIAGNOSIS — Z87898 Personal history of other specified conditions: Secondary | ICD-10-CM

## 2021-06-15 DIAGNOSIS — I429 Cardiomyopathy, unspecified: Secondary | ICD-10-CM

## 2021-06-15 MED ORDER — SPIRONOLACTONE 25 MG PO TABS: ORAL_TABLET | ORAL | 0 refills | Status: DC

## 2021-06-15 MED ORDER — ENTRESTO 24-26 MG PO TABS: 1.0000 | ORAL_TABLET | Freq: Two times a day (BID) | ORAL | 0 refills | Status: DC
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
## 2021-06-20 ENCOUNTER — Encounter: Payer: Medicare Other | Admitting: Physical Therapy

## 2021-06-24 ENCOUNTER — Other Ambulatory Visit: Payer: Self-pay | Admitting: Cardiology

## 2021-06-24 DIAGNOSIS — I429 Cardiomyopathy, unspecified: Secondary | ICD-10-CM

## 2021-06-26 NOTE — Therapy (Signed)
OUTPATIENT PHYSICAL THERAPY TREATMENT NOTE   Patient Name: Ashley Lindsey MRN: [id number] DOB:12-31-1960, 61 y.o., female Today's Date: 06/27/2021  PCP: [name], FNP REFERRING PROVIDER: [name], FNP   PT End of Session - 06/27/21 1032     Visit Number 3    Number of Visits 13    Date for PT Re-Evaluation 07/14/21    PT Start Time 1027    PT Stop Time 1100    PT Time Calculation (min) 33 min    Activity Tolerance Patient tolerated treatment well    Behavior During Therapy [facility] for tasks assessed/performed              Past Medical History:  Diagnosis Date   Abdominal cramps    Arthritis    Cancer ([place])    s/p radiation- last dose 6/12//has been off Tamoxifen 8/12   Chronic headaches    Costochondritis    Fatigue    Fibromyalgia 03/06/2013   History of COVID-19    Hypertension    Lymphedema    RIGHT ARM-////  STATES USE LEFT ARM FOR BP'S   Migraines    Muscle spasms of head and/or neck    following to the breast   Passed out    4th of july   Personal history of radiation therapy 2010   lt breast    S/P radiation therapy 08/19/07 - 10/10/07   Left Breast/5040 cGy/28 fractions with Boost for a toatl dose of 6300 dGy   S/P radiation therapy 08/14/10 -10/03/10   right breast   Sleep apnea    STOP BANG SCORE 5   Past Surgical History:  Procedure Laterality Date   ABDOMINAL HYSTERECTOMY     with left salpingooophorectomy   BREAST LUMPECTOMY  2009/2012   LEFT/RIGHT with lymph node dissection   KNEE ARTHROSCOPY     right   OOPHORECTOMY  2013   Patient Active Problem List   Diagnosis Date Noted   Avulsion fracture of middle phalanx of finger 04/18/2021   Tibial plateau fracture, left 04/18/2021   Asymmetrical sensorineural hearing loss 02/02/2021   Bilateral primary osteoarthritis of knee 02/09/2020   Chronic intermittent hypoxia with obstructive sleep apnea 08/24/2019   Chronic intractable headache 07/14/2019   Morning headache 07/14/2019   Loud  snoring 07/14/2019   Super obesity 07/14/2019   Sleeps in sitting position due to orthopnea 07/14/2019   Chest pain due to GERD 07/14/2019   Nocturia more than twice per night 07/14/2019   Unilateral primary osteoarthritis, right knee 05/26/2019   Chronic migraine without aura without status migrainosus, not intractable 05/13/2019   Class 3 severe obesity due to excess calories without serious comorbidity with body mass index (BMI) of 40.0 to 44.9 in adult ([place]) 08/05/2018   Depression 08/05/2018   Chronic nonintractable headache 08/05/2018   Elevated blood-pressure reading without diagnosis of hypertension 06/06/2018   Chronic pain of right ankle 04/24/2018   Breast cancer of lower-outer quadrant of right female breast ([place]) 12/02/2014   Bronchospasm 09/21/2014   Dyspnea 09/21/2014   Chronic pain of right knee 09/21/2014   Fibromyalgia 03/06/2013   Dense breasts 03/06/2013   Mastalgia 03/06/2013   Back pain, lumbosacral 03/06/2013   Lymphedema of arm 03/06/2013   Atypical chest pain 01/06/2013   Breast cancer of upper-inner quadrant of left female breast ([place]) 09/08/2012   Family history of malignant neoplasm of breast 09/08/2012   S/P radiation therapy    History of breast cancer in female 08/13/2011  REFERRING DIAG: S82.142D (ICD-10-CM) - Closed fracture of left tibial plateau with routine healing, subsequent encounter   THERAPY DIAG:  Acute pain of left knee   Difficulty in walking, not elsewhere classified   Muscle weakness (generalized)   Localized edema  PERTINENT HISTORY: bilateral breast CA with radiation, h/o lymphadema, HTN, arthritis, fibromyalgia, Covid-19, migraines,  Left 4th finger fx    PRECAUTIONS: Lymphadema precautions  SUBJECTIVE: Pt reporting she is compliant with HEP. We dicussed concerns about her left ring finger fx. We discussed stretching and using putty to perform exercises.   PAIN:  Are you having pain? Yes NPRS scale: 7/10 Pain  location: knee Pain orientation: Left  PAIN TYPE: aching Pain description: burning  Aggravating factors: standing Relieving factors: heat, elevate in recliner     OBJECTIVE:    DIAGNOSTIC FINDINGS: left tibia fx   PATIENT SURVEYS:  05/31/2021 FOTO 31% (predicted 51%)   MUSCLE LENGTH: 05/31/21: Hamstrings: Right 60 deg; Left 48 deg      LE AROM/PROM:   AROM Right 05/31/2021 Left 05/31/2021 Rt 06/27/2021 Left 06/27/2021   Hip flexion        Hip extension        Hip abduction        Hip adduction        Hip internal rotation        Hip external rotation        Knee flexion 70   85 90 105  Knee extension -10 -18 -8 -12  Ankle dorsiflexion        Ankle plantarflexion        Ankle inversion        Ankle eversion         (Blank rows = not tested)   LE MMT:   MMT Right 05/31/2021 Left 05/31/2021  Hip flexion 4+/5 4/5  Hip extension      Hip abduction 4+/5 4/5  Hip adduction 4+/5 4/5  Hip internal rotation      Hip external rotation      Knee flexion 4/5 3/5    Knee extension 4/5 3/5  Ankle dorsiflexion      Ankle plantarflexion      Ankle inversion      Ankle eversion       (Blank rows = not tested)       FUNCTIONAL TESTS:  05/31/21: 5 times sit to stand: 39 seconds with UE support 06/27/2021: 5 time sit to stand: 17 seconds with UE support       TODAY'S TREATMENT:  06/27/2021   Nustep: L3  LE only x 8 minutes   Calf stretch: x 2 holding 30 seconds on slant board   Leg Press: 50# bilateral LE's 2x15   Airex: feet apart x 30 seconds no UE support    Feet together 30 seconds with UE support   LAQ: 3# weight,  x15 holding 3 seconds   Bridges: 2x5   SLR: bilateral LE's x10 with quad set and DF      06/08/2021:  Nustep L3 x 7 min,  SAQ x 10 5 sec hold Seated: LAQ x 10 5 sec hold, heel slides on slider x 10 Long sitting: HS stretch with strap for calf stretch 2x 30 sec, quad sets 5 sec hold x 10, Manual: IASTM to left ant tib and peroneals     PATIENT  EDUCATION:  Education details:  HEP Person educated: Patient Education method: Explanation, Media planner, Corporate treasurer cues, Verbal cues, and Handouts Education comprehension:  verbalized understanding and returned demonstration     HOME EXERCISE PROGRAM: Access Code: [email or web address] URL: [email or web address] Date: 05/31/2021 Prepared by: Kearney Hard   Exercises Supine Quad Set - 3-4 x daily - 7 x weekly - 10 reps - 5 seconds hold Hooklying Hamstring Stretch with Strap - 3-4 x daily - 7 x weekly - 3 reps - 20 seocnds hold Seated Hamstring Stretch - 3-4 x daily - 7 x weekly - 3 reps - 20 seconds hold Seated Knee Flexion Extension AROM - 3-4 x daily - 7 x weekly - 10 reps - 3 seconds hold Seated Long Arc Quad - 3-4 x daily - 7 x weekly - 2 sets - 10 reps - 3 seconds hold     ASSESSMENT:   CLINICAL IMPRESSION: 06/27/2021:  Pt arriving today > 10 minutes late, reporting 6/10 pain in her left leg. Pt reporting compliance in her HEP. Pt tolerating exercises well. Pt instructed and demonstrated some gentle stretching exercises for her Left 4th digit to add to her HEP. Continue skilled PT to maximize pt's function.       REHAB POTENTIAL: Fair     CLINICAL DECISION MAKING: Evolving/moderate complexity   EVALUATION COMPLEXITY: Moderate     GOALS: Goals reviewed with patient? Yes   SHORT TERM GOALS: GOALS: Goals reviewed with patient? Yes   SHORT TERM GOALS:   STG Name Target Date Goal status  1 Independent with initial HEP Baseline: able to demonstrate indepdendence 06/21/2021 Met 06/27/2021  2 Pt will improve her 5  time sit to stand to </= 25 seconds with UE support Baseline: 17 seconds with UE support 06/14/2021 Met 06/27/2021  3           LONG TERM GOALS:    LTG Name Target Date Goal status  1 Independent with final HEP Baseline: 07/12/2021 INITIAL  2 FOTO score improved to >/= 51% for improved function Baseline: 07/12/2021 INITIAL  3 Pt's left knee flexion  improved to >/= 110 for improved function Baseline: 07/12/2021 INITIAL  4 Report pain < 3/10 with amb community surfaces with LRAD.   Baseline: 07/12/2021 INITIAL  5 Pt will be able to perform step up  on curb step modified independently.  Baseline: 07/12/2021 INITIAL        PLAN: PT FREQUENCY: 2x/week   PT DURATION: 6 weeks   PLANNED INTERVENTIONS: Therapeutic exercises, Therapeutic activity, Neuro Muscular re-education, Balance training, Gait training, Patient/Family education, Joint mobilization, Stair training, Dry Needling, Cryotherapy, Moist heat, Taping, Vasopneumatic device, and Manual therapy   PLAN FOR NEXT SESSION: Nustep, quadsets, LAQ, sit to stand, heel slides, bridges, PROM as tolerated    Oretha Caprice, PT MPT 06/27/2021, 10:38 AM

## 2021-06-27 ENCOUNTER — Other Ambulatory Visit: Payer: Self-pay | Admitting: Hematology and Oncology

## 2021-06-27 ENCOUNTER — Other Ambulatory Visit: Payer: Self-pay

## 2021-06-27 ENCOUNTER — Encounter: Payer: Self-pay | Admitting: Physical Therapy

## 2021-06-27 ENCOUNTER — Ambulatory Visit (INDEPENDENT_AMBULATORY_CARE_PROVIDER_SITE_OTHER): Payer: Medicare Other | Admitting: Physical Therapy

## 2021-06-27 DIAGNOSIS — R262 Difficulty in walking, not elsewhere classified: Secondary | ICD-10-CM | POA: Diagnosis not present

## 2021-06-27 DIAGNOSIS — M25562 Pain in left knee: Secondary | ICD-10-CM

## 2021-06-27 DIAGNOSIS — M6281 Muscle weakness (generalized): Secondary | ICD-10-CM

## 2021-06-27 DIAGNOSIS — R600 Localized edema: Secondary | ICD-10-CM | POA: Diagnosis not present

## 2021-06-27 MED ORDER — GABAPENTIN 300 MG PO CAPS: ORAL_CAPSULE | ORAL | 3 refills | Status: DC

## 2021-06-29 ENCOUNTER — Encounter: Payer: Self-pay | Admitting: Rehabilitative and Restorative Service Providers"

## 2021-06-29 ENCOUNTER — Ambulatory Visit (INDEPENDENT_AMBULATORY_CARE_PROVIDER_SITE_OTHER): Payer: Medicare Other | Admitting: Rehabilitative and Restorative Service Providers"

## 2021-06-29 ENCOUNTER — Other Ambulatory Visit: Payer: Self-pay

## 2021-06-29 DIAGNOSIS — M6281 Muscle weakness (generalized): Secondary | ICD-10-CM | POA: Diagnosis not present

## 2021-06-29 DIAGNOSIS — R262 Difficulty in walking, not elsewhere classified: Secondary | ICD-10-CM | POA: Diagnosis not present

## 2021-06-29 DIAGNOSIS — R600 Localized edema: Secondary | ICD-10-CM

## 2021-06-29 DIAGNOSIS — M25562 Pain in left knee: Secondary | ICD-10-CM | POA: Diagnosis not present

## 2021-06-29 NOTE — Therapy (Signed)
OUTPATIENT PHYSICAL THERAPY TREATMENT NOTE   Patient Name: Ashley Lindsey MRN: [id number] DOB:1961-04-02, 61 y.o., female Today's Date: 06/29/2021  PCP: [name], FNP REFERRING PROVIDER: Garald Balding, MD   PT End of Session - 06/29/21 1020     Visit Number 4    Number of Visits 13    Date for PT Re-Evaluation 07/14/21    PT Start Time 1012    PT Stop Time 1051    PT Time Calculation (min) 39 min    Activity Tolerance Patient limited by pain    Behavior During Therapy [facility] for tasks assessed/performed               Past Medical History:  Diagnosis Date   Abdominal cramps    Arthritis    Cancer ([place])    s/p radiation- last dose 6/12//has been off Tamoxifen 8/12   Chronic headaches    Costochondritis    Fatigue    Fibromyalgia 03/06/2013   History of COVID-19    Hypertension    Lymphedema    RIGHT ARM-////  STATES USE LEFT ARM FOR BP'S   Migraines    Muscle spasms of head and/or neck    following to the breast   Passed out    4th of july   Personal history of radiation therapy 2010   lt breast    S/P radiation therapy 08/19/07 - 10/10/07   Left Breast/5040 cGy/28 fractions with Boost for a toatl dose of 6300 dGy   S/P radiation therapy 08/14/10 -10/03/10   right breast   Sleep apnea    STOP BANG SCORE 5   Past Surgical History:  Procedure Laterality Date   ABDOMINAL HYSTERECTOMY     with left salpingooophorectomy   BREAST LUMPECTOMY  2009/2012   LEFT/RIGHT with lymph node dissection   KNEE ARTHROSCOPY     right   OOPHORECTOMY  2013   Patient Active Problem List   Diagnosis Date Noted   Avulsion fracture of middle phalanx of finger 04/18/2021   Tibial plateau fracture, left 04/18/2021   Asymmetrical sensorineural hearing loss 02/02/2021   Bilateral primary osteoarthritis of knee 02/09/2020   Chronic intermittent hypoxia with obstructive sleep apnea 08/24/2019   Chronic intractable headache 07/14/2019   Morning headache 07/14/2019   Loud  snoring 07/14/2019   Super obesity 07/14/2019   Sleeps in sitting position due to orthopnea 07/14/2019   Chest pain due to GERD 07/14/2019   Nocturia more than twice per night 07/14/2019   Unilateral primary osteoarthritis, right knee 05/26/2019   Chronic migraine without aura without status migrainosus, not intractable 05/13/2019   Class 3 severe obesity due to excess calories without serious comorbidity with body mass index (BMI) of 40.0 to 44.9 in adult ([place]) 08/05/2018   Depression 08/05/2018   Chronic nonintractable headache 08/05/2018   Elevated blood-pressure reading without diagnosis of hypertension 06/06/2018   Chronic pain of right ankle 04/24/2018   Breast cancer of lower-outer quadrant of right female breast ([place]) 12/02/2014   Bronchospasm 09/21/2014   Dyspnea 09/21/2014   Chronic pain of right knee 09/21/2014   Fibromyalgia 03/06/2013   Dense breasts 03/06/2013   Mastalgia 03/06/2013   Back pain, lumbosacral 03/06/2013   Lymphedema of arm 03/06/2013   Atypical chest pain 01/06/2013   Breast cancer of upper-inner quadrant of left female breast ([place]) 09/08/2012   Family history of malignant neoplasm of breast 09/08/2012   S/P radiation therapy    History of breast cancer in female 08/13/2011  REFERRING DIAG: S82.142D (ICD-10-CM) - Closed fracture of left tibial plateau with routine healing, subsequent encounter   THERAPY DIAG:  Acute pain of left knee   Difficulty in walking, not elsewhere classified   Muscle weakness (generalized)   Localized edema  PERTINENT HISTORY: bilateral breast CA with radiation, h/o lymphadema, HTN, arthritis, fibromyalgia, Covid-19, migraines,  Left 4th finger fx    PRECAUTIONS: Lymphadema precautions  SUBJECTIVE:  Pt indicated a little sore today.  Pt indicated 6/10 when hurting.  Pt indicated she tried to walk more at store and that led to increased symptoms.   PAIN:  Are you having pain? Yes NPRS scale: 6/10 Pain location:  knee Pain orientation: Left  PAIN TYPE: aching Pain description: burning  Aggravating factors: standing Relieving factors: heat, elevate in recliner     OBJECTIVE:     PATIENT SURVEYS:  05/31/2021 FOTO 31% (predicted 51%)   MUSCLE LENGTH: 05/31/21: Hamstrings: Right 60 deg; Left 48 deg      LE AROM/PROM:   AROM Right 05/31/2021 Left 05/31/2021 Rt 06/27/2021 Left 06/27/2021   Hip flexion        Hip extension        Hip abduction        Hip adduction        Hip internal rotation        Hip external rotation        Knee flexion 70   85 90 105  Knee extension -10 -18 -8 -12  Ankle dorsiflexion        Ankle plantarflexion        Ankle inversion        Ankle eversion         (Blank rows = not tested)   LE MMT:   MMT Right 05/31/2021 Left 05/31/2021  Hip flexion 4+/5 4/5  Hip extension      Hip abduction 4+/5 4/5  Hip adduction 4+/5 4/5  Hip internal rotation      Hip external rotation      Knee flexion 4/5 3/5    Knee extension 4/5 3/5  Ankle dorsiflexion      Ankle plantarflexion      Ankle inversion      Ankle eversion       (Blank rows = not tested)       FUNCTIONAL TESTS:  05/31/21: 5 times sit to stand: 39 seconds with UE support 06/27/2021: 5 time sit to stand: 17 seconds with UE support       TODAY'S TREATMENT: 06/29/2021  Therex: Nustep: Lvl 4   LE only x 8 minutes    Calf stretch: x 2 holding 30 seconds on slant board    Leg Press: 50# bilateral LE's 2x15, single leg 25 lbs x 15 bilateral       Feet together 30 seconds with UE support    Lt LAQ: 3# weight, x15 pause in flexion and extension     Bridges: 2x5    Seated SLR x 10 c slow control focus (quad set c DF hold).  Performed bilateral   06/27/2021   Nustep: L3  LE only x 8 minutes   Calf stretch: x 2 holding 30 seconds on slant board   Leg Press: 50# bilateral LE's 2x15   Airex: feet apart x 30 seconds no UE support    Feet together 30 seconds with UE support   LAQ: 3# weight,  x15 holding 3  seconds   Bridges: 2x5   SLR: bilateral  LE's x10 with quad set and DF      06/08/2021:  Nustep L3 x 7 min,  SAQ x 10 5 sec hold Seated: LAQ x 10 5 sec hold, heel slides on slider x 10 Long sitting: HS stretch with strap for calf stretch 2x 30 sec, quad sets 5 sec hold x 10, Manual: IASTM to left ant tib and peroneals     PATIENT EDUCATION:  Education details:  HEP Person educated: Patient Education method: Consulting civil engineer, Demonstration, Corporate treasurer cues, Verbal cues, and Handouts Education comprehension: verbalized understanding and returned demonstration     HOME EXERCISE PROGRAM: Access Code: [email or web address] URL: [email or web address] Date: 05/31/2021 Prepared by: Kearney Hard   Exercises Supine Quad Set - 3-4 x daily - 7 x weekly - 10 reps - 5 seconds hold Hooklying Hamstring Stretch with Strap - 3-4 x daily - 7 x weekly - 3 reps - 20 seocnds hold Seated Hamstring Stretch - 3-4 x daily - 7 x weekly - 3 reps - 20 seconds hold Seated Knee Flexion Extension AROM - 3-4 x daily - 7 x weekly - 10 reps - 3 seconds hold Seated Long Arc Quad - 3-4 x daily - 7 x weekly - 2 sets - 10 reps - 3 seconds hold     ASSESSMENT:   CLINICAL IMPRESSION: Antalgic gait noted c decreased WB on Lt leg c maintained knee flexion in stance.  Continued strengthening and balance control to improve WB stance/control.       REHAB POTENTIAL: Fair     CLINICAL DECISION MAKING: Evolving/moderate complexity   EVALUATION COMPLEXITY: Moderate     GOALS: Goals reviewed with patient? Yes   SHORT TERM GOALS: GOALS: Goals reviewed with patient? Yes   SHORT TERM GOALS:   STG Name Target Date Goal status  1 Independent with initial HEP Baseline: able to demonstrate indepdendence 06/21/2021 Met 06/27/2021  2 Pt will improve her 5  time sit to stand to </= 25 seconds with UE support Baseline: 17 seconds with UE support 06/14/2021 Met 06/27/2021  3           LONG TERM GOALS:    LTG Name Target  Date Goal status  1 Independent with final HEP Baseline: 07/12/2021 On going Assessed 06/29/2021  2 FOTO score improved to >/= 51% for improved function Baseline: 07/12/2021 On going Assessed 06/29/2021  3 Pt's left knee flexion improved to >/= 110 for improved function Baseline: 07/12/2021 On going Assessed 06/29/2021  4 Report pain < 3/10 with amb community surfaces with LRAD.   Baseline: 07/12/2021 On going Assessed 06/29/2021  5 Pt will be able to perform step up  on curb step modified independently.  Baseline: 07/12/2021 On going Assessed 06/29/2021        PLAN: PT FREQUENCY: 2x/week   PT DURATION: 6 weeks   PLANNED INTERVENTIONS: Therapeutic exercises, Therapeutic activity, Neuro Muscular re-education, Balance training, Gait training, Patient/Family education, Joint mobilization, Stair training, Dry Needling, Cryotherapy, Moist heat, Taping, Vasopneumatic device, and Manual therapy   PLAN FOR NEXT SESSION: Progress easily in strengthening and WB activity due to WB pressure pain complaints.    Scot Jun, PT, DPT, OCS, ATC 06/29/21  10:49 AM

## 2021-06-30 ENCOUNTER — Other Ambulatory Visit: Payer: Self-pay

## 2021-06-30 DIAGNOSIS — I429 Cardiomyopathy, unspecified: Secondary | ICD-10-CM

## 2021-07-04 ENCOUNTER — Encounter: Payer: Self-pay | Admitting: Physical Therapy

## 2021-07-04 ENCOUNTER — Other Ambulatory Visit: Payer: Self-pay

## 2021-07-04 ENCOUNTER — Ambulatory Visit (INDEPENDENT_AMBULATORY_CARE_PROVIDER_SITE_OTHER): Payer: Medicare Other | Admitting: Physical Therapy

## 2021-07-04 DIAGNOSIS — M25562 Pain in left knee: Secondary | ICD-10-CM | POA: Diagnosis not present

## 2021-07-04 DIAGNOSIS — R262 Difficulty in walking, not elsewhere classified: Secondary | ICD-10-CM | POA: Diagnosis not present

## 2021-07-04 DIAGNOSIS — R600 Localized edema: Secondary | ICD-10-CM | POA: Diagnosis not present

## 2021-07-04 DIAGNOSIS — M6281 Muscle weakness (generalized): Secondary | ICD-10-CM | POA: Diagnosis not present

## 2021-07-04 NOTE — Therapy (Addendum)
?OUTPATIENT PHYSICAL THERAPY TREATMENT NOTE ?Discharge ? ? ?Patient Name: Ashley Lindsey ?MRN: [id number] ?DOB:June 16, 1960, 61 y.o., female ?Today's Date: 07/04/2021 ? ?PCP: [name], FNP ?REFERRING PROVIDER: Garald Balding, MD ? ? PT End of Session - 07/04/21 1057   ? ? Visit Number 5   ? Number of Visits 13   ? Date for PT Re-Evaluation 07/14/21   ? PT Start Time 1033   ? PT Stop Time 1059   ? PT Time Calculation (min) 26 min   ? Activity Tolerance Patient limited by pain   ? Behavior During Therapy [facility] for tasks assessed/performed   ? ?  ?  ? ?  ? ? ? ? ? ?Past Medical History:  ?Diagnosis Date  ? Abdominal cramps   ? Arthritis   ? Cancer [facility])   ? s/p radiation- last dose 6/12//has been off Tamoxifen 8/12  ? Chronic headaches   ? Costochondritis   ? Fatigue   ? Fibromyalgia 03/06/2013  ? History of COVID-19   ? Hypertension   ? Lymphedema   ? RIGHT ARM-////  STATES USE LEFT ARM FOR BP'S  ? Migraines   ? Muscle spasms of head and/or neck   ? following to the breast  ? Passed out   ? 4th of july  ? Personal history of radiation therapy 2010  ? lt breast   ? S/P radiation therapy 08/19/07 - 10/10/07  ? Left Breast/5040 cGy/28 fractions with Boost for a toatl dose of 6300 dGy  ? S/P radiation therapy 08/14/10 -10/03/10  ? right breast  ? Sleep apnea   ? STOP BANG SCORE 5  ? ?Past Surgical History:  ?Procedure Laterality Date  ? ABDOMINAL HYSTERECTOMY    ? with left salpingooophorectomy  ? BREAST LUMPECTOMY  2009/2012  ? LEFT/RIGHT with lymph node dissection  ? KNEE ARTHROSCOPY    ? right  ? OOPHORECTOMY  2013  ? ?Patient Active Problem List  ? Diagnosis Date Noted  ? Avulsion fracture of middle phalanx of finger 04/18/2021  ? Tibial plateau fracture, left 04/18/2021  ? Asymmetrical sensorineural hearing loss 02/02/2021  ? Bilateral primary osteoarthritis of knee 02/09/2020  ? Chronic intermittent hypoxia with obstructive sleep apnea 08/24/2019  ? Chronic intractable headache 07/14/2019  ? Morning headache  07/14/2019  ? Loud snoring 07/14/2019  ? Super obesity 07/14/2019  ? Sleeps in sitting position due to orthopnea 07/14/2019  ? Chest pain due to GERD 07/14/2019  ? Nocturia more than twice per night 07/14/2019  ? Unilateral primary osteoarthritis, right knee 05/26/2019  ? Chronic migraine without aura without status migrainosus, not intractable 05/13/2019  ? Class 3 severe obesity due to excess calories without serious comorbidity with body mass index (BMI) of 40.0 to 44.9 in adult [facility]) 08/05/2018  ? Depression 08/05/2018  ? Chronic nonintractable headache 08/05/2018  ? Elevated blood-pressure reading without diagnosis of hypertension 06/06/2018  ? Chronic pain of right ankle 04/24/2018  ? Breast cancer of lower-outer quadrant of right female breast ([place]) 12/02/2014  ? Bronchospasm 09/21/2014  ? Dyspnea 09/21/2014  ? Chronic pain of right knee 09/21/2014  ? Fibromyalgia 03/06/2013  ? Dense breasts 03/06/2013  ? Mastalgia 03/06/2013  ? Back pain, lumbosacral 03/06/2013  ? Lymphedema of arm 03/06/2013  ? Atypical chest pain 01/06/2013  ? Breast cancer of upper-inner quadrant of left female breast ([place]) 09/08/2012  ? Family history of malignant neoplasm of breast 09/08/2012  ? S/P radiation therapy   ? History of breast cancer in  female 08/13/2011  ? ? ?REFERRING DIAG: S82.142D (ICD-10-CM) - Closed fracture of left tibial plateau with routine healing, subsequent encounter  ? ?THERAPY DIAG:  ?Acute pain of left knee ?  ?Difficulty in walking, not elsewhere classified ?  ?Muscle weakness (generalized) ?  ?Localized edema ? ?PERTINENT HISTORY: bilateral breast CA with radiation, h/o lymphadema, HTN, arthritis, fibromyalgia, Covid-19, migraines,  ?Left 4th finger fx ?  ? ?PRECAUTIONS: Lymphadema precautions ? ?SUBJECTIVE:  Pt indicated a little sore today.  Pt indicated 6/10 when hurting.  Pt indicated she tried to walk more at store and that led to increased symptoms.  ? ?PAIN:  ?Are you having pain? Yes ?NPRS scale:  6/10 ?Pain location: knee ?Pain orientation: Left  ?PAIN TYPE: aching ?Pain description: burning  ?Aggravating factors: standing ?Relieving factors: heat, elevate in recliner ? ? ? ? ?OBJECTIVE:  ?  ? ?PATIENT SURVEYS:  ?05/31/2021 ?FOTO 31% (predicted 51%) ?  ?MUSCLE LENGTH: ?05/31/21: Hamstrings: Right 60 deg; Left 48 deg ? ? ?  ? LE AROM/PROM: ?  ?AROM Right ?05/31/2021 Left ?05/31/2021 Rt ?06/27/2021 Left ?06/27/2021 ?  ?Hip flexion        ?Hip extension        ?Hip abduction        ?Hip adduction        ?Hip internal rotation        ?Hip external rotation        ?Knee flexion 70 ?  85 90 105  ?Knee extension -10 -18 -8 -12  ?Ankle dorsiflexion        ?Ankle plantarflexion        ?Ankle inversion        ?Ankle eversion        ? (Blank rows = not tested) ?  ?LE MMT: ?  ?MMT Right ?05/31/2021 Left ?05/31/2021  ?Hip flexion 4+/5 4/5  ?Hip extension      ?Hip abduction 4+/5 4/5  ?Hip adduction 4+/5 4/5  ?Hip internal rotation      ?Hip external rotation      ?Knee flexion 4/5 3/5 ?   ?Knee extension 4/5 3/5  ?Ankle dorsiflexion      ?Ankle plantarflexion      ?Ankle inversion      ?Ankle eversion      ? (Blank rows = not tested) ?  ?  ?  ?FUNCTIONAL TESTS:  ?05/31/21: 5 times sit to stand: 39 seconds with UE support ?06/27/2021: 5 time sit to stand: 17 seconds with UE support ?  ?  ?  ?TODAY'S TREATMENT: ?  07/04/2021:  ? Therex: Nustep: Lvl 4  LE only x 8 minutes ?   Calf stretch: x 2 holding 30 seconds on slant board ?   Leg Press: 75# bilateral LE's 2x10, single leg 50 lbs x 10 bilateral ?   Lt LAQ: 3# weight, x15 pause in flexion and extension  ?   Seated SLR x 10 c slow control focus (quad set c DF hold).   ?   Stepping on curb step x 3 with close supervision ? ?06/29/2021 ? Therex: Nustep: Lvl 4   LE only x 8 minutes ?   Calf stretch: x 2 holding 30 seconds on slant board ?   Leg Press: 50# bilateral LE's 2x15, single leg 25 lbs x 15 bilateral    ?   Feet together 30 seconds with UE support ?   Lt LAQ: 3# weight, x15 pause in  flexion and extension  ?  Bridges: 2x5 ?   Seated SLR x 10 c slow control focus (quad set c DF hold).  Performed bilateral ? ? ? ?06/27/2021 ?  Nustep: L3  LE only x 8 minutes ?  Calf stretch: x 2 holding 30 seconds on slant board ?  Leg Press: 50# bilateral LE's 2x15 ?  Airex: feet apart x 30 seconds no UE support ?   Feet together 30 seconds with UE support ?  LAQ: 3# weight,  x15 holding 3 seconds ?  Bridges: 2x5 ?  SLR: bilateral LE's x10 with quad set and DF  ? ?   ? ?  ?  ?PATIENT EDUCATION:  ?Education details:  HEP ?Person educated: Patient ?Education method: Explanation, Demonstration, Tactile cues, Verbal cues, and Handouts ?Education comprehension: verbalized understanding and returned demonstration ?  ?  ?HOME EXERCISE PROGRAM: ?Access Code: [email or web address] ?URL: [email or web address] ?Date: 05/31/2021 ?Prepared by: Kearney Hard ?  ?Exercises ?Supine Quad Set - 3-4 x daily - 7 x weekly - 10 reps - 5 seconds hold ?Hooklying Hamstring Stretch with Strap - 3-4 x daily - 7 x weekly - 3 reps - 20 seocnds hold ?Seated Hamstring Stretch - 3-4 x daily - 7 x weekly - 3 reps - 20 seconds hold ?Seated Knee Flexion Extension AROM - 3-4 x daily - 7 x weekly - 10 reps - 3 seconds hold ?Seated Long Arc Quad - 3-4 x daily - 7 x weekly - 2 sets - 10 reps - 3 seconds hold ?  ?  ?ASSESSMENT: ?  ?CLINICAL IMPRESSION: ?07/04/2021:  ?Pt arriving to therapy reporting 7/10 pain in left knee along lateral joint line. Pt arriving today 18 minutes late. Pt tolerating exercises for LE AROM and strengthening. Pt questioning weight loss centers. Pt was encouraged to follow up with PCP for consultation with nutritionist or weight loss center.  Continue skilled PT to maximize pt's function.  ? ? ? Antalgic gait noted c decreased WB on Lt leg c maintained knee flexion in stance.  Continued strengthening and balance control to improve WB stance/control.  ? ?  ?  ?REHAB POTENTIAL: Fair   ?  ?CLINICAL DECISION MAKING:  Evolving/moderate complexity ?  ?EVALUATION COMPLEXITY: Moderate ?  ?  ?GOALS: ?Goals reviewed with patient? Yes ?  ?SHORT TERM GOALS: ?GOALS: ?Goals reviewed with patient? Yes ?  ?SHORT TERM GOALS: ?  ?STG Name Target Date Goal

## 2021-07-06 ENCOUNTER — Encounter
Payer: Medicare Other | Attending: Physical Medicine and Rehabilitation | Admitting: Physical Medicine and Rehabilitation

## 2021-07-06 ENCOUNTER — Other Ambulatory Visit: Payer: Self-pay

## 2021-07-06 VITALS — BP 128/83 | HR 92 | Ht 61.0 in | Wt 282.0 lb

## 2021-07-06 DIAGNOSIS — I10 Essential (primary) hypertension: Secondary | ICD-10-CM | POA: Insufficient documentation

## 2021-07-06 DIAGNOSIS — M545 Low back pain, unspecified: Secondary | ICD-10-CM | POA: Diagnosis not present

## 2021-07-06 DIAGNOSIS — Z6841 Body Mass Index (BMI) 40.0 and over, adult: Secondary | ICD-10-CM | POA: Insufficient documentation

## 2021-07-06 DIAGNOSIS — M797 Fibromyalgia: Secondary | ICD-10-CM | POA: Insufficient documentation

## 2021-07-06 MED ORDER — TOPIRAMATE 25 MG PO TABS: 25.0000 mg | ORAL_TABLET | Freq: Every day | ORAL | 3 refills | Status: DC

## 2021-07-06 MED ORDER — DICLOFENAC SODIUM 1 % EX GEL: 2.0000 g | Freq: Four times a day (QID) | CUTANEOUS | 3 refills | Status: DC

## 2021-07-06 NOTE — Addendum Note (Signed)
Addended by: Izora Ribas on: 07/06/2021 10:51 AM ? ? Modules accepted: Orders, Level of Service ? ?
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
## 2021-07-06 NOTE — Patient Instructions (Signed)
Foods that may reduce pain: ?1) Ginger (especially studied for arthritis)- reduce leukotriene production to decrease inflammation ?2) Blueberries- high in phytonutrients that decrease inflammation ?3) Salmon- marine omega-3s reduce joint swelling and pain ?4) Pumpkin seeds- reduce inflammation ?5) dark chocolate- reduces inflammation ?6) turmeric- reduces inflammation ?7) tart cherries - reduce pain and stiffness ?8) extra virgin olive oil - its compound olecanthal helps to block prostaglandins  ?9) chili peppers- can be eaten or applied topically via capsaicin ?10) mint- helpful for headache, muscle aches, joint pain, and itching ?11) garlic- reduces inflammation ? ?Link to further information on diet for chronic pain: [email or web address] ? ?HTN: ?-Foods that can help lower blood pressure and provided with a list: ?1) citrus foods- high in vitamins and minerals ?2) salmon and other fatty fish - reduces inflammation and oxylipins ?3) swiss chard (leafy green)- high level of nitrates ?4) pumpkin seeds- one of the best natural sources of magnesium ?5) Beans and lentils- high in fiber, magnesium, and potassium ?6) Berries- high in flavonoids ?7) Amaranth (whole grain, can be cooked similarly to rice and oats)- high in magnesium and fiber ?8) Pistachios- even more effective at reducing BP than other nuts ?9) Carrots- high in phenolic compounds that relax blood vessels and reduce inflammation ?10) Celery- contain phthalides that relax tissues of arterial walls ?11) Tomatoes- can also improve cholesterol and reduce risk of heart disease ?12) Broccoli- good source of magnesium, calcium, and potassium ?13) Greek yogurt: high in potassium and calcium ?14) Herbs and spices: Celery seed, cilantro, saffron, lemongrass, black cumin, ginseng, cinnamon, cardamom, sweet basil, and ginger ?15) Chia and flax seeds- also help to lower cholesterol and blood  sugar ?16) Beets- high levels of nitrates that relax blood vessels  ?17) spinach and bananas- high in potassium ? ?-Provided lise of supplements that can help with hypertension:  ?1) magnesium: one high quality brand is Bioptemizers since it contains all 7 types of magnesium, otherwise over the counter magnesium gluconate '400mg'$  is a good option ?2) B vitamins ?3) vitamin D ?4) potassium ?5) CoQ10 ?6) L-arginine ?7) Vitamin C ?8) Beetroot ?-Educated that goal BP is 120/80. ?-Made goal to incorporate some of the above foods into diet.    ?

## 2021-07-06 NOTE — Addendum Note (Signed)
Addended by: Izora Ribas on: 07/06/2021 10:41 AM ? ? Modules accepted: Orders ? ?

## 2021-07-08 ENCOUNTER — Other Ambulatory Visit: Payer: Self-pay | Admitting: Cardiology

## 2021-07-08 ENCOUNTER — Other Ambulatory Visit: Payer: Self-pay | Admitting: Nurse Practitioner

## 2021-07-08 DIAGNOSIS — I429 Cardiomyopathy, unspecified: Secondary | ICD-10-CM

## 2021-07-08 DIAGNOSIS — R110 Nausea: Secondary | ICD-10-CM

## 2021-07-10 ENCOUNTER — Other Ambulatory Visit: Payer: Self-pay | Admitting: Nurse Practitioner

## 2021-07-10 DIAGNOSIS — C50419 Malignant neoplasm of upper-outer quadrant of unspecified female breast: Secondary | ICD-10-CM

## 2021-07-10 NOTE — Therapy (Incomplete)
?OUTPATIENT PHYSICAL THERAPY TREATMENT NOTE ? ? ?Patient Name: Ashley Lindsey ?MRN: [id number] ?DOB:1960/11/24, 61 y.o., female ?Today's Date: 07/10/2021 ? ?PCP: [name], FNP ?REFERRING PROVIDER: Garald Balding, MD ? ? ? ? ? ? ? ?Past Medical History:  ?Diagnosis Date  ? Abdominal cramps   ? Arthritis   ? Cancer [facility])   ? s/p radiation- last dose 6/12//has been off Tamoxifen 8/12  ? Chronic headaches   ? Costochondritis   ? Fatigue   ? Fibromyalgia 03/06/2013  ? History of COVID-19   ? Hypertension   ? Lymphedema   ? RIGHT ARM-////  STATES USE LEFT ARM FOR BP'S  ? Migraines   ? Muscle spasms of head and/or neck   ? following to the breast  ? Passed out   ? 4th of july  ? Personal history of radiation therapy 2010  ? lt breast   ? S/P radiation therapy 08/19/07 - 10/10/07  ? Left Breast/5040 cGy/28 fractions with Boost for a toatl dose of 6300 dGy  ? S/P radiation therapy 08/14/10 -10/03/10  ? right breast  ? Sleep apnea   ? STOP BANG SCORE 5  ? ?Past Surgical History:  ?Procedure Laterality Date  ? ABDOMINAL HYSTERECTOMY    ? with left salpingooophorectomy  ? BREAST LUMPECTOMY  2009/2012  ? LEFT/RIGHT with lymph node dissection  ? KNEE ARTHROSCOPY    ? right  ? OOPHORECTOMY  2013  ? ?Patient Active Problem List  ? Diagnosis Date Noted  ? Avulsion fracture of middle phalanx of finger 04/18/2021  ? Tibial plateau fracture, left 04/18/2021  ? Asymmetrical sensorineural hearing loss 02/02/2021  ? Bilateral primary osteoarthritis of knee 02/09/2020  ? Chronic intermittent hypoxia with obstructive sleep apnea 08/24/2019  ? Chronic intractable headache 07/14/2019  ? Morning headache 07/14/2019  ? Loud snoring 07/14/2019  ? Super obesity 07/14/2019  ? Sleeps in sitting position due to orthopnea 07/14/2019  ? Chest pain due to GERD 07/14/2019  ? Nocturia more than twice per night 07/14/2019  ? Unilateral primary osteoarthritis, right knee 05/26/2019  ? Chronic migraine without aura without status migrainosus, not  intractable 05/13/2019  ? Class 3 severe obesity due to excess calories without serious comorbidity with body mass index (BMI) of 40.0 to 44.9 in adult [facility]) 08/05/2018  ? Depression 08/05/2018  ? Chronic nonintractable headache 08/05/2018  ? Elevated blood-pressure reading without diagnosis of hypertension 06/06/2018  ? Chronic pain of right ankle 04/24/2018  ? Breast cancer of lower-outer quadrant of right female breast ([place]) 12/02/2014  ? Bronchospasm 09/21/2014  ? Dyspnea 09/21/2014  ? Chronic pain of right knee 09/21/2014  ? Fibromyalgia 03/06/2013  ? Dense breasts 03/06/2013  ? Mastalgia 03/06/2013  ? Back pain, lumbosacral 03/06/2013  ? Lymphedema of arm 03/06/2013  ? Atypical chest pain 01/06/2013  ? Breast cancer of upper-inner quadrant of left female breast ([place]) 09/08/2012  ? Family history of malignant neoplasm of breast 09/08/2012  ? S/P radiation therapy   ? History of breast cancer in female 08/13/2011  ? ? ?REFERRING DIAG: S82.142D (ICD-10-CM) - Closed fracture of left tibial plateau with routine healing, subsequent encounter  ? ?THERAPY DIAG:  ?Acute pain of left knee ?  ?Difficulty in walking, not elsewhere classified ?  ?Muscle weakness (generalized) ?  ?Localized edema ? ?PERTINENT HISTORY: bilateral breast CA with radiation, h/o lymphadema, HTN, arthritis, fibromyalgia, Covid-19, migraines,  ?Left 4th finger fx ?  ? ?PRECAUTIONS: Lymphadema precautions ? ?SUBJECTIVE:  Pt indicated a little sore  today.  Pt indicated 6/10 when hurting.  Pt indicated she tried to walk more at store and that led to increased symptoms.  ? ?PAIN:  ?Are you having pain? Yes ?NPRS scale: 6/10 ?Pain location: knee ?Pain orientation: Left  ?PAIN TYPE: aching ?Pain description: burning  ?Aggravating factors: standing ?Relieving factors: heat, elevate in recliner ? ? ? ? ?OBJECTIVE:  ?  ? ?PATIENT SURVEYS:  ? ?05/31/2021: FOTO 31% (predicted 51%) ? ?07/11/2021: FOTO *** ?  ?MUSCLE LENGTH: ?05/31/21: Hamstrings: Right 60 deg;  Left 48 deg ? ? ?  ? LE AROM/PROM: ?  ?AROM Right ?05/31/2021 Left ?05/31/2021 Rt ?06/27/2021 Left ?06/27/2021 ?  ?Hip flexion        ?Hip extension        ?Hip abduction        ?Hip adduction        ?Hip internal rotation        ?Hip external rotation        ?Knee flexion 70 ?  85 90 105  ?Knee extension -10 -18 -8 -12  ?Ankle dorsiflexion        ?Ankle plantarflexion        ?Ankle inversion        ?Ankle eversion        ? (Blank rows = not tested) ?  ?LE MMT: ?  ?MMT Right ?05/31/2021 Left ?05/31/2021  ?Hip flexion 4+/5 4/5  ?Hip extension      ?Hip abduction 4+/5 4/5  ?Hip adduction 4+/5 4/5  ?Hip internal rotation      ?Hip external rotation      ?Knee flexion 4/5 3/5 ?   ?Knee extension 4/5 3/5  ?Ankle dorsiflexion      ?Ankle plantarflexion      ?Ankle inversion      ?Ankle eversion      ? (Blank rows = not tested) ?  ?  ?  ?FUNCTIONAL TESTS:  ?05/31/21: 5 times sit to stand: 39 seconds with UE support ?06/27/2021: 5 time sit to stand: 17 seconds with UE support ?  ?  ?  ?TODAY'S TREATMENT: ?07/11/2021:  ?Therex: Nustep: Lvl 4  LE only x 8 minutes ?  Calf stretch: x 2 holding 30 seconds on slant board ?    Leg Press: 75# bilateral LE's 2x10, single leg 50 lbs x 10 bilateral ?    Lt LAQ: 3# weight, x15 pause in flexion and extension  ?    Seated SLR x 10 c slow control focus (quad set c DF hold).   ?     Stepping on curb step x 3 with close supervision ? ? ?  07/04/2021:  ? Therex: Nustep: Lvl 4  LE only x 8 minutes ?   Calf stretch: x 2 holding 30 seconds on slant board ?   Leg Press: 75# bilateral LE's 2x10, single leg 50 lbs x 10 bilateral ?   Lt LAQ: 3# weight, x15 pause in flexion and extension  ?   Seated SLR x 10 c slow control focus (quad set c DF hold).   ?   Stepping on curb step x 3 with close supervision ? ?06/29/2021 ? Therex: Nustep: Lvl 4   LE only x 8 minutes ?   Calf stretch: x 2 holding 30 seconds on slant board ?   Leg Press: 50# bilateral LE's 2x15, single leg 25 lbs x 15 bilateral    ?   Feet together 30  seconds with UE support ?  Lt LAQ: 3# weight, x15 pause in flexion and extension  ?   Bridges: 2x5 ?   Seated SLR x 10 c slow control focus (quad set c DF hold).  Performed bilateral ? ?  ?  ?PATIENT EDUCATION:  ?Education details:  HEP ?Person educated: Patient ?Education method: Explanation, Demonstration, Tactile cues, Verbal cues, and Handouts ?Education comprehension: verbalized understanding and returned demonstration ?  ?  ?HOME EXERCISE PROGRAM: ?Access Code: [email or web address] ?URL: [email or web address] ?Date: 05/31/2021 ?Prepared by: Kearney Hard ?  ?Exercises ?Supine Quad Set - 3-4 x daily - 7 x weekly - 10 reps - 5 seconds hold ?Hooklying Hamstring Stretch with Strap - 3-4 x daily - 7 x weekly - 3 reps - 20 seocnds hold ?Seated Hamstring Stretch - 3-4 x daily - 7 x weekly - 3 reps - 20 seconds hold ?Seated Knee Flexion Extension AROM - 3-4 x daily - 7 x weekly - 10 reps - 3 seconds hold ?Seated Long Arc Quad - 3-4 x daily - 7 x weekly - 2 sets - 10 reps - 3 seconds hold ?  ?  ?ASSESSMENT: ?  ?CLINICAL IMPRESSION: ?07/11/2021:  ? ? ? ? ?07/04/2021:  ?Pt arriving to therapy reporting 7/10 pain in left knee along lateral joint line. Pt arriving today 18 minutes late. Pt tolerating exercises for LE AROM and strengthening. Pt questioning weight loss centers. Pt was encouraged to follow up with PCP for consultation with nutritionist or weight loss center.  Continue skilled PT to maximize pt's function.  ? ? ? Antalgic gait noted c decreased WB on Lt leg c maintained knee flexion in stance.  Continued strengthening and balance control to improve WB stance/control.  ? ?  ?  ?REHAB POTENTIAL: Fair   ?  ?CLINICAL DECISION MAKING: Evolving/moderate complexity ?  ?EVALUATION COMPLEXITY: Moderate ?  ?  ?GOALS: ?Goals reviewed with patient? Yes ?  ?SHORT TERM GOALS: ?GOALS: ?Goals reviewed with patient? Yes ?  ?SHORT TERM GOALS: ?  ?STG Name Target Date Goal status  ?1 Independent with initial HEP ?Baseline:  able to demonstrate indepdendence 06/21/2021 Met ?06/27/2021  ?2 Pt will improve her 5  time sit to stand to </= 25 seconds with UE support ?Baseline: 17 seconds with UE support 06/14/2021 Met ?06/27/2021  ?3   ?      ?  ?L

## 2021-07-11 ENCOUNTER — Telehealth: Payer: Self-pay | Admitting: Physical Therapy

## 2021-07-11 ENCOUNTER — Encounter: Payer: Medicare Other | Admitting: Physical Therapy

## 2021-07-11 ENCOUNTER — Encounter: Payer: Self-pay | Admitting: Physical Medicine and Rehabilitation

## 2021-07-11 NOTE — Telephone Encounter (Signed)
I called to follow up with pt after she missed her PT appointment at 10:15 this morning. Pt stated she had called to cancel today's appointment due to migraine headache and she would see [place] Thursday at her next visit.  ? ?Kearney Hard, PT, MPT ?07/11/21 10:48 AM ? ? ?

## 2021-07-12 ENCOUNTER — Encounter: Payer: Self-pay | Admitting: Emergency Medicine

## 2021-07-12 ENCOUNTER — Other Ambulatory Visit: Payer: Self-pay | Admitting: Physical Medicine and Rehabilitation

## 2021-07-12 ENCOUNTER — Ambulatory Visit (INDEPENDENT_AMBULATORY_CARE_PROVIDER_SITE_OTHER): Payer: Medicare Other | Admitting: Emergency Medicine

## 2021-07-12 ENCOUNTER — Other Ambulatory Visit: Payer: Self-pay

## 2021-07-12 DIAGNOSIS — Z87898 Personal history of other specified conditions: Secondary | ICD-10-CM | POA: Insufficient documentation

## 2021-07-12 DIAGNOSIS — R0602 Shortness of breath: Secondary | ICD-10-CM

## 2021-07-12 DIAGNOSIS — R918 Other nonspecific abnormal finding of lung field: Secondary | ICD-10-CM | POA: Diagnosis not present

## 2021-07-12 DIAGNOSIS — R053 Chronic cough: Secondary | ICD-10-CM | POA: Diagnosis not present

## 2021-07-12 NOTE — Assessment & Plan Note (Signed)
Likely multiple factors.  She does have GERD that appears to be well controlled.  She is on Entresto which could be a contributor.  Also question obstructive lung disease.  Plan to continue her Symbicort, albuterol, Dexilant.  Could consider discontinuation ACE inhibitor if symptoms were concerning, although risk/benefit likely favors continuing her Entresto. ?

## 2021-07-12 NOTE — Assessment & Plan Note (Signed)
With presumed obstructive lung disease based on her improvement with Symbicort.  No pulmonary function testing available, we discussed doing this at some point in the future.  She will stay on the Symbicort for now, have albuterol as needed available. ?
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
## 2021-07-12 NOTE — Patient Instructions (Addendum)
We will plan to repeat your CT scan of the chest in December 2023 to compare with priors. ?Continue your Symbicort 2 puffs twice a day.  Rinse and gargle after using. ?Keep your albuterol available to use 2 puffs if needed for shortness of breath, chest tightness, wheezing. ?We may decide to consider pulmonary function testing at some point in the future. ?Continue your Dexilant as you have been taking it ?Follow with Dr Lamonte Sakai in December after your CT scan so we can review the results together.  Call sooner if you have any problems. ?

## 2021-07-12 NOTE — Assessment & Plan Note (Signed)
2 small 4 mm right upper lobe pulmonary nodules, peripheral and likely nodal tissue.  Looks like there is stability going back to 11/2017.  We discussed possibly deferring any follow-up.  Given her history of breast cancer we will repeat in December 2023.  That would give approximately 4.5 years of stability.  If no change at that time then we can stop following. ?

## 2021-07-12 NOTE — Addendum Note (Signed)
Addended by: Gavin Potters R on: 07/12/2021 11:52 AM ? ? Modules accepted: Orders ? ?

## 2021-07-13 ENCOUNTER — Encounter: Payer: Medicare Other | Admitting: Rehabilitative and Restorative Service Providers"

## 2021-07-18 ENCOUNTER — Encounter: Payer: Medicare Other | Admitting: Physical Therapy

## 2021-07-18 ENCOUNTER — Other Ambulatory Visit: Payer: Self-pay | Admitting: Nurse Practitioner

## 2021-07-19 ENCOUNTER — Encounter: Payer: Self-pay | Admitting: Orthopaedic Surgery

## 2021-07-19 ENCOUNTER — Other Ambulatory Visit: Payer: Self-pay

## 2021-07-19 ENCOUNTER — Ambulatory Visit (INDEPENDENT_AMBULATORY_CARE_PROVIDER_SITE_OTHER): Payer: Medicare Other

## 2021-07-19 ENCOUNTER — Ambulatory Visit (INDEPENDENT_AMBULATORY_CARE_PROVIDER_SITE_OTHER): Payer: Medicare Other | Admitting: Orthopaedic Surgery

## 2021-07-19 DIAGNOSIS — M25562 Pain in left knee: Secondary | ICD-10-CM

## 2021-07-19 DIAGNOSIS — M25561 Pain in right knee: Secondary | ICD-10-CM

## 2021-07-19 DIAGNOSIS — G8929 Other chronic pain: Secondary | ICD-10-CM

## 2021-07-19 DIAGNOSIS — M79642 Pain in left hand: Secondary | ICD-10-CM | POA: Diagnosis not present

## 2021-07-19 NOTE — Progress Notes (Signed)
? ?Office Visit Note ?  ?Patient: Ashley Lindsey           ?Date of Birth: 1960/09/13           ?MRN: [id number] ?Visit Date: 07/19/2021 ?             ?Requested by: [name], [place] ?[address] ?[address] ?[place],   [id number] ?PCP: [name], FNP ? ? ?Assessment & Plan: ?Visit Diagnoses:  ?1. Chronic pain of both knees   ?2. Pain in left hand   ? ? ?Plan: Patient follows up for her left lateral tibial plateau fracture as well as her bilateral knee pain and left base of thumb pain.  X-rays are reassuring that the lateral tibial plateau fracture on the left is healed.  She does have significant varus arthritis both of her knees.  With a varus of 12 to 13 degrees her BMI is currently 50 however she has recently lost 32 pounds and she is planning on losing 75 more.  She is also working on controlling her fibromyalgia.  She is working with a Microbiologist.  She does have arthritis at the base of her thumb which she can treat with Voltaren gel.  She is planning on moving to [place] at the end of the year.  We have recommended if she is getting closer to her goals [facility] would certainly be a good option considering where she is living. ? ?Follow-Up Instructions: No follow-ups on file.  ? ?Orders:  ?Orders Placed This Encounter  ?Procedures  ? XR KNEE 3 VIEW LEFT  ? XR KNEE 3 VIEW RIGHT  ? XR Hand Complete Left  ? ?No orders of the defined types were placed in this encounter. ? ? ? ? Procedures: ?No procedures performed ? ? ?Clinical Data: ?No additional findings. ? ? ?Subjective: ?Chief Complaint  ?Patient presents with  ? Left Knee - Pain  ? Right Knee - Pain  ? Left Hand - Pain  ?Patient presents today for a 7week follow up on her left knee tibial plateau fracture. She said that she is doing well, and has finished physical therapy. She is going to the gym and doing exercises there. She uses a cane to help with ambulation. She is wanting to have both knees x-rayed today to see how they look now. She is also  complaining of left thumb pain that starts at the base of her thumb and wraps into her left ring finger.  ? ?HPI ? ?Review of Systems  ?All other systems reviewed and are negative. ? ? ?Objective: ?Vital Signs: There were no vitals taken for this visit. ? ?Physical Exam ?Constitutional:   ?   Appearance: Normal appearance.  ?Pulmonary:  ?   Effort: Pulmonary effort is normal.  ?Skin: ?   General: Skin is warm and dry.  ?Neurological:  ?   General: No focal deficit present.  ?   Mental Status: She is alert.  ?Psychiatric:     ?   Mood and Affect: Mood normal.  ? ? ?Ortho Exam ?Examination of her knees she has bilateral crepitus with range of motion.  Global tenderness medial and laterally she does ambulate with a cane.  Compartments are soft.  Base of her left thumb is tender to deep palpation she does have good opposition to her lesser fingers.  Her tips of her fingers are warm.  Pulses intact. ?Specialty Comments:  ?No specialty comments available. ? ?Imaging: ?No results found. ? ? ?PMFS History: ?Patient  Active Problem List  ? Diagnosis Date Noted  ? Pulmonary nodules 07/12/2021  ? Chronic cough 07/12/2021  ? Avulsion fracture of middle phalanx of finger 04/18/2021  ? Tibial plateau fracture, left 04/18/2021  ? Asymmetrical sensorineural hearing loss 02/02/2021  ? Bilateral primary osteoarthritis of knee 02/09/2020  ? Chronic intermittent hypoxia with obstructive sleep apnea 08/24/2019  ? Chronic intractable headache 07/14/2019  ? Morning headache 07/14/2019  ? Loud snoring 07/14/2019  ? Super obesity 07/14/2019  ? Sleeps in sitting position due to orthopnea 07/14/2019  ? Chest pain due to GERD 07/14/2019  ? Nocturia more than twice per night 07/14/2019  ? Unilateral primary osteoarthritis, right knee 05/26/2019  ? Chronic migraine without aura without status migrainosus, not intractable 05/13/2019  ? Class 3 severe obesity due to excess calories without serious comorbidity with body mass index (BMI) of 40.0 to  44.9 in adult [facility]) 08/05/2018  ? Depression 08/05/2018  ? Chronic nonintractable headache 08/05/2018  ? Elevated blood-pressure reading without diagnosis of hypertension 06/06/2018  ? Chronic pain of right ankle 04/24/2018  ? Breast cancer of lower-outer quadrant of right female breast ([place]) 12/02/2014  ? Bronchospasm 09/21/2014  ? Dyspnea 09/21/2014  ? Chronic pain of right knee 09/21/2014  ? Fibromyalgia 03/06/2013  ? Dense breasts 03/06/2013  ? Mastalgia 03/06/2013  ? Back pain, lumbosacral 03/06/2013  ? Lymphedema of arm 03/06/2013  ? Atypical chest pain 01/06/2013  ? Breast cancer of upper-inner quadrant of left female breast ([place]) 09/08/2012  ? Family history of malignant neoplasm of breast 09/08/2012  ? S/P radiation therapy   ? History of breast cancer in female 08/13/2011  ? ?Past Medical History:  ?Diagnosis Date  ? Abdominal cramps   ? Arthritis   ? Cancer [facility])   ? s/p radiation- last dose 6/12//has been off Tamoxifen 8/12  ? Chronic headaches   ? Costochondritis   ? Fatigue   ? Fibromyalgia 03/06/2013  ? History of COVID-19   ? Hypertension   ? Lymphedema   ? RIGHT ARM-////  STATES USE LEFT ARM FOR BP'S  ? Migraines   ? Muscle spasms of head and/or neck   ? following to the breast  ? Passed out   ? 4th of july  ? Personal history of radiation therapy 2010  ? lt breast   ? S/P radiation therapy 08/19/07 - 10/10/07  ? Left Breast/5040 cGy/28 fractions with Boost for a toatl dose of 6300 dGy  ? S/P radiation therapy 08/14/10 -10/03/10  ? right breast  ? Sleep apnea   ? STOP BANG SCORE 5  ?  ?Family History  ?Problem Relation Age of Onset  ? Breast cancer Mother 33  ? Heart disease Father   ? Cancer Maternal Aunt 63  ?     d. from "female cancer" possibly cervical  ? Breast cancer Maternal Aunt 60  ? Prostate cancer Maternal Uncle 78  ? Prostate cancer Maternal Uncle 80  ? Prostate cancer Paternal Uncle   ? Prostate cancer Paternal Uncle   ? Prostate cancer Maternal Grandfather 69  ? Breast cancer Cousin 63  ?      mat 1st cousin  ?  ?Past Surgical History:  ?Procedure Laterality Date  ? ABDOMINAL HYSTERECTOMY    ? with left salpingooophorectomy  ? BREAST LUMPECTOMY  2009/2012  ? LEFT/RIGHT with lymph node dissection  ? KNEE ARTHROSCOPY    ? right  ? OOPHORECTOMY  2013  ? ?Social History  ? ?Occupational History  ?  Occupation: disability  ?Tobacco Use  ? Smoking status: Former  ?  Packs/day: 0.25  ?  Years: 1.00  ?  Pack years: 0.25  ?  Types: Cigarettes  ? Smokeless tobacco: Never  ?Vaping Use  ? Vaping Use: Never used  ?Substance and Sexual Activity  ? Alcohol use: Not Currently  ? Drug use: No  ? Sexual activity: Not Currently  ?  Birth control/protection: Surgical  ? ? ? ? ? ? ?

## 2021-07-20 ENCOUNTER — Encounter: Payer: Medicare Other | Admitting: Rehabilitative and Restorative Service Providers"

## 2021-07-20 ENCOUNTER — Encounter: Payer: Self-pay | Admitting: Nurse Practitioner

## 2021-07-21 ENCOUNTER — Encounter (HOSPITAL_COMMUNITY): Payer: Self-pay | Admitting: Emergency Medicine

## 2021-07-21 ENCOUNTER — Ambulatory Visit (HOSPITAL_COMMUNITY)
Admission: EM | Admit: 2021-07-21 | Discharge: 2021-07-21 | Disposition: A | Discharge status: Home / Self Care | Payer: Medicare Other | Attending: Family Medicine | Admitting: Family Medicine

## 2021-07-21 DIAGNOSIS — L03211 Cellulitis of face: Secondary | ICD-10-CM

## 2021-07-21 MED ORDER — MUPIROCIN 2 % EX OINT: 1.0000 "application " | TOPICAL_OINTMENT | Freq: Two times a day (BID) | CUTANEOUS | 0 refills | Status: DC

## 2021-07-21 MED ORDER — CLINDAMYCIN HCL 300 MG PO CAPS: 300.0000 mg | ORAL_CAPSULE | Freq: Three times a day (TID) | ORAL | 0 refills | Status: AC

## 2021-07-21 NOTE — Discharge Instructions (Addendum)
Take clindamycin 300 mg--1 capsule 3 times daily for 5 days ? ?Use mupirocin ointment on the affected area twice daily till its better ?

## 2021-07-21 NOTE — ED Provider Notes (Signed)
?[facility] ? ? ? ?CSN: [id number] ?Arrival date & time: 07/21/21  1041 ? ? ?  ? ?History   ?Chief Complaint ?Chief Complaint  ?Patient presents with  ? Skin Discoloration  ? ? ?HPI ?Ashley Lindsey is a 61 y.o. female.  ? ?HPI ?Here for some swelling and pain on her right chin just at the corner of her mouth.  About 1 week ago she noted some swelling there and discomfort.  Then it looked more like a pimple and she got it to pop.  Now the irritation has spread and is not healing.  She also feels a little irritated inside her mouth.  No vomiting or diarrhea or fever ? ?It has never looked like a blister ? ? ?Past Medical History:  ?Diagnosis Date  ? Abdominal cramps   ? Arthritis   ? Cancer [facility])   ? s/p radiation- last dose 6/12//has been off Tamoxifen 8/12  ? Chronic headaches   ? Costochondritis   ? Fatigue   ? Fibromyalgia 03/06/2013  ? History of COVID-19   ? Hypertension   ? Lymphedema   ? RIGHT ARM-////  STATES USE LEFT ARM FOR BP'S  ? Migraines   ? Muscle spasms of head and/or neck   ? following to the breast  ? Passed out   ? 4th of july  ? Personal history of radiation therapy 2010  ? lt breast   ? S/P radiation therapy 08/19/07 - 10/10/07  ? Left Breast/5040 cGy/28 fractions with Boost for a toatl dose of 6300 dGy  ? S/P radiation therapy 08/14/10 -10/03/10  ? right breast  ? Sleep apnea   ? STOP BANG SCORE 5  ? ? ?Patient Active Problem List  ? Diagnosis Date Noted  ? Pulmonary nodules 07/12/2021  ? Chronic cough 07/12/2021  ? Avulsion fracture of middle phalanx of finger 04/18/2021  ? Tibial plateau fracture, left 04/18/2021  ? Asymmetrical sensorineural hearing loss 02/02/2021  ? Bilateral primary osteoarthritis of knee 02/09/2020  ? Chronic intermittent hypoxia with obstructive sleep apnea 08/24/2019  ? Chronic intractable headache 07/14/2019  ? Morning headache 07/14/2019  ? Loud snoring 07/14/2019  ? Super obesity 07/14/2019  ? Sleeps in sitting position due to orthopnea 07/14/2019  ? Chest pain  due to GERD 07/14/2019  ? Nocturia more than twice per night 07/14/2019  ? Unilateral primary osteoarthritis, right knee 05/26/2019  ? Chronic migraine without aura without status migrainosus, not intractable 05/13/2019  ? Class 3 severe obesity due to excess calories without serious comorbidity with body mass index (BMI) of 40.0 to 44.9 in adult [facility]) 08/05/2018  ? Depression 08/05/2018  ? Chronic nonintractable headache 08/05/2018  ? Elevated blood-pressure reading without diagnosis of hypertension 06/06/2018  ? Chronic pain of right ankle 04/24/2018  ? Breast cancer of lower-outer quadrant of right female breast ([place]) 12/02/2014  ? Bronchospasm 09/21/2014  ? Dyspnea 09/21/2014  ? Chronic pain of right knee 09/21/2014  ? Fibromyalgia 03/06/2013  ? Dense breasts 03/06/2013  ? Mastalgia 03/06/2013  ? Back pain, lumbosacral 03/06/2013  ? Lymphedema of arm 03/06/2013  ? Atypical chest pain 01/06/2013  ? Breast cancer of upper-inner quadrant of left female breast ([place]) 09/08/2012  ? Family history of malignant neoplasm of breast 09/08/2012  ? S/P radiation therapy   ? History of breast cancer in female 08/13/2011  ? ? ?Past Surgical History:  ?Procedure Laterality Date  ? ABDOMINAL HYSTERECTOMY    ? with left salpingooophorectomy  ? BREAST LUMPECTOMY  2009/2012  ? LEFT/RIGHT with lymph node dissection  ? KNEE ARTHROSCOPY    ? right  ? OOPHORECTOMY  2013  ? ? ?OB History   ?No obstetric history on file. ?  ? ? ? ?Home Medications   ? ?Prior to Admission medications   ?Medication Sig Start Date End Date Taking? Authorizing Provider  ?clindamycin (CLEOCIN) 300 MG capsule Take 1 capsule (300 mg total) by mouth 3 (three) times daily for 5 days. 07/21/21 07/26/21 Yes Barrett Henle, MD  ?mupirocin ointment (BACTROBAN) 2 % Apply 1 application. topically 2 (two) times daily. To affected area till better 07/21/21  Yes Brieana Shimmin, Gwenlyn Perking, MD  ?acetaminophen (TYLENOL) 650 MG CR tablet Take 650 mg by mouth every 8 (eight) hours as  needed for pain.    [provider]  ?albuterol (VENTOLIN HFA) 108 (90 Base) MCG/ACT inhaler INHALE 1-2 PUFFS INTO THE LUNGS EVERY 6 (SIX) HOURS AS NEEDED FOR WHEEZING OR SHORTNESS OF BREATH. 05/03/20   Nicholas Lose, MD  ?aspirin-acetaminophen-caffeine (EXCEDRIN MIGRAINE) [phone] MG tablet Take 1 tablet by mouth every 6 (six) hours as needed for headache.    [provider]  ?Bisacodyl (DULCOLAX PO) Take 1 capsule by mouth daily as needed (constipation).     [provider]  ?cholecalciferol 5000 units TABS Take 1 tablet (5,000 Units total) by mouth 2 (two) times daily. ?Patient taking differently: Take 5,000 Units by mouth daily. 07/21/15   Nicholas Lose, MD  ?cyclobenzaprine (FLEXERIL) 10 MG tablet TAKE 1 TABLET BY MOUTH THREE TIMES A DAY AS NEEDED 07/10/21   [name], FNP  ?dexlansoprazole (DEXILANT) 60 MG capsule TAKE 1 CAPSULE BY MOUTH EVERY DAY 07/10/21   [name], FNP  ?diclofenac Sodium (VOLTAREN) 1 % GEL Apply 2 g topically 4 (four) times daily. 07/06/21   Raulkar, Clide Deutscher, MD  ?diphenhydrAMINE (BENADRYL) 25 MG tablet Take 1 tablet (25 mg total) by mouth every 6 (six) hours as needed for up to 20 doses (migraine). Take with reglan for migraine headache 09/11/20   Wyvonnia Dusky, MD  ?ELDERBERRY PO Take 1 tablet by mouth daily.    [provider]  ?fish oil-omega-3 fatty acids 1000 MG capsule Take 1 g by mouth daily.    [provider]  ?Flaxseed, Linseed, (FLAXSEED OIL MAX STR PO) Take 1 capsule by mouth daily.     [provider]  ?gabapentin (NEURONTIN) 300 MG capsule TAKE 1 CAPSULE BY MOUTH THREE TIMES A DAY 06/27/21   Raulkar, Clide Deutscher, MD  ?hydrochlorothiazide (HYDRODIURIL) 25 MG tablet Take 25 mg by mouth daily. 06/27/21   [provider]  ?Iron-FA-B Cmp-C-Biot-Probiotic (FUSION PLUS) CAPS Take 1 capsule by mouth daily. 06/23/18   [name], [place]  ?meloxicam (MOBIC) 7.5 MG tablet TAKE 1 TABLET BY MOUTH EVERY DAY 06/08/21   [name], FNP  ?Multiple Vitamins-Minerals (MULTIVITAMIN & MINERAL PO) Take 1 tablet by mouth daily.    [provider]  ?omeprazole (PRILOSEC) 20 MG capsule Take 20 mg by mouth daily.    [provider]  ?ondansetron (ZOFRAN) 4 MG tablet TAKE 1 TABLET BY MOUTH DAILY AS NEEDED FOR NAUSEA OR VOMITING 06/12/21   [name], FNP  ?OVER THE COUNTER MEDICATION Take 125 mg by mouth daily. [place]/Simethicone    [provider]  ?propranolol (INDERAL) 20 MG tablet Take 20 mg by mouth 2 (two) times daily.    [provider]  ?sacubitril-valsartan (ENTRESTO) 24-26 MG Take 1 tablet by mouth 2 (two)  times daily. 06/15/21 09/13/21  Tolia, Sunit, DO  ?spironolactone (ALDACTONE) 25 MG tablet TAKE 1 TABLET BY MOUTH EVERY DAY IN THE MORNING 06/26/21   Tolia, Sunit, DO  ?SYMBICORT 80-4.5 MCG/ACT inhaler INHALE 2 PUFFS INTO THE LUNGS IN THE MORNING AND AT BEDTIME. 04/21/21   [name], FNP  ?topiramate (TOPAMAX) 25 MG tablet Take 1 tablet (25 mg total) by mouth daily. 07/06/21   Raulkar, Clide Deutscher, MD  ?triamcinolone cream (KENALOG) 0.1 % Apply 1 application topically 2 (two) times daily. Apply for up to one week. ?Patient taking differently: Apply 1 application. topically 2 (two) times daily as needed (rash). 12/29/16   Vanessa Kick, MD  ?Turmeric 500 MG CAPS Take by mouth.    [provider]  ?venlafaxine XR (EFFEXOR-XR) 150 MG 24 hr capsule TAKE 1 CAPSULE BY MOUTH NIGHTLY WITH 75 MG CAPSULE (TOTAL OF 225 MG NIGHTLY) 06/27/21   Nicholas Lose, MD  ?venlafaxine XR (EFFEXOR-XR) 75 MG 24 hr capsule TAKE 1 CAPSULE BY MOUTH DAILY IN COMBINATION WITH XR 150 MG (TO EQUAL 225 MG TOTAL DAILY). 12/28/20   Nicholas Lose, MD  ?vitamin E 200 UNIT capsule Take 200 Units by mouth daily.    [provider]  ? ? ?Family History ?Family History  ?Problem Relation Age of Onset  ? Breast cancer Mother 27  ? Heart disease Father   ? Cancer Maternal Aunt 63  ?     d. from "female cancer" possibly cervical   ? Breast cancer Maternal Aunt 60  ? Prostate cancer Maternal Uncle 78  ? Prostate cancer Maternal Uncle 80  ? Prostate cancer Paternal Uncle   ? Prostate cancer Paternal Uncle   ? Prostate cancer M

## 2021-07-21 NOTE — ED Triage Notes (Signed)
Pt reports having facial discoloration to chin area. Reports that trying to tweeze her hairs around chin, dug for one on left of mouth. Then became a pimple like area that popped. Now area getting bigger and feels like spreading to her mouth. Been putting antibiotic cream and mouth wash.  ?

## 2021-07-31 ENCOUNTER — Other Ambulatory Visit: Payer: Self-pay | Admitting: Nurse Practitioner

## 2021-07-31 ENCOUNTER — Encounter: Payer: Self-pay | Admitting: Nurse Practitioner

## 2021-08-07 ENCOUNTER — Encounter: Payer: Self-pay | Admitting: Nurse Practitioner

## 2021-08-09 ENCOUNTER — Ambulatory Visit: Payer: Medicare Other | Admitting: Nurse Practitioner

## 2021-08-17 ENCOUNTER — Other Ambulatory Visit: Payer: Self-pay | Admitting: Nurse Practitioner

## 2021-08-17 DIAGNOSIS — R030 Elevated blood-pressure reading, without diagnosis of hypertension: Secondary | ICD-10-CM

## 2021-08-22 ENCOUNTER — Other Ambulatory Visit: Payer: Self-pay | Admitting: Nurse Practitioner

## 2021-09-11 ENCOUNTER — Telehealth: Payer: Self-pay | Admitting: Hematology and Oncology

## 2021-09-11 NOTE — Telephone Encounter (Signed)
Rescheduled appointment per provider request. Patient is aware of the changes made to her upcoming appointment. ?

## 2021-09-19 ENCOUNTER — Other Ambulatory Visit: Payer: Self-pay | Admitting: Cardiology

## 2021-09-19 DIAGNOSIS — I429 Cardiomyopathy, unspecified: Secondary | ICD-10-CM

## 2021-09-21 ENCOUNTER — Other Ambulatory Visit: Payer: Self-pay | Admitting: Nurse Practitioner

## 2021-09-21 DIAGNOSIS — R030 Elevated blood-pressure reading, without diagnosis of hypertension: Secondary | ICD-10-CM

## 2021-09-25 ENCOUNTER — Encounter: Payer: Self-pay | Admitting: Orthopaedic Surgery

## 2021-09-28 ENCOUNTER — Ambulatory Visit: Payer: Medicare Other | Admitting: Physician Assistant

## 2021-09-29 ENCOUNTER — Ambulatory Visit: Payer: Medicare Other | Admitting: Physician Assistant

## 2021-10-02 ENCOUNTER — Ambulatory Visit (INDEPENDENT_AMBULATORY_CARE_PROVIDER_SITE_OTHER): Payer: Medicare Other | Admitting: Physician Assistant

## 2021-10-02 ENCOUNTER — Encounter: Payer: Self-pay | Admitting: Nurse Practitioner

## 2021-10-02 ENCOUNTER — Encounter: Payer: Self-pay | Admitting: Physician Assistant

## 2021-10-02 DIAGNOSIS — M1711 Unilateral primary osteoarthritis, right knee: Secondary | ICD-10-CM | POA: Diagnosis not present

## 2021-10-02 DIAGNOSIS — M1712 Unilateral primary osteoarthritis, left knee: Secondary | ICD-10-CM | POA: Diagnosis not present

## 2021-10-02 MED ORDER — LIDOCAINE HCL 1 % IJ SOLN
2.0000 mL | INTRAMUSCULAR | Status: AC | PRN
  Administered 2021-10-02: 2 mL

## 2021-10-02 MED ORDER — METHYLPREDNISOLONE ACETATE 40 MG/ML IJ SUSP
80.0000 mg | INTRAMUSCULAR | Status: AC | PRN
  Administered 2021-10-02: 80 mg via INTRA_ARTICULAR
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
## 2021-10-03 ENCOUNTER — Other Ambulatory Visit: Payer: Self-pay | Admitting: Physical Medicine and Rehabilitation

## 2021-10-03 ENCOUNTER — Other Ambulatory Visit: Payer: Self-pay | Admitting: Cardiology

## 2021-10-03 ENCOUNTER — Other Ambulatory Visit: Payer: Self-pay | Admitting: Hematology and Oncology

## 2021-10-03 ENCOUNTER — Other Ambulatory Visit: Payer: Self-pay | Admitting: Nurse Practitioner

## 2021-10-03 DIAGNOSIS — M797 Fibromyalgia: Secondary | ICD-10-CM

## 2021-10-03 DIAGNOSIS — I429 Cardiomyopathy, unspecified: Secondary | ICD-10-CM

## 2021-10-05 ENCOUNTER — Ambulatory Visit: Payer: Medicare Other | Admitting: Hematology and Oncology

## 2021-10-10 ENCOUNTER — Other Ambulatory Visit: Payer: Self-pay | Admitting: Nurse Practitioner

## 2021-10-10 ENCOUNTER — Encounter: Payer: Self-pay | Admitting: Nurse Practitioner

## 2021-10-10 ENCOUNTER — Ambulatory Visit: Payer: Medicare Other | Admitting: Nurse Practitioner

## 2021-10-10 DIAGNOSIS — Z1231 Encounter for screening mammogram for malignant neoplasm of breast: Secondary | ICD-10-CM

## 2021-10-12 ENCOUNTER — Other Ambulatory Visit: Payer: Self-pay | Admitting: Cardiology

## 2021-10-12 DIAGNOSIS — I429 Cardiomyopathy, unspecified: Secondary | ICD-10-CM

## 2021-10-16 ENCOUNTER — Ambulatory Visit (INDEPENDENT_AMBULATORY_CARE_PROVIDER_SITE_OTHER): Payer: Medicare Other | Admitting: Nurse Practitioner

## 2021-10-16 ENCOUNTER — Other Ambulatory Visit: Payer: Self-pay | Admitting: Hematology and Oncology

## 2021-10-16 ENCOUNTER — Encounter: Payer: Self-pay | Admitting: Nurse Practitioner

## 2021-10-16 ENCOUNTER — Other Ambulatory Visit: Payer: Self-pay | Admitting: Nurse Practitioner

## 2021-10-16 VITALS — BP 122/78 | HR 89 | Temp 98.2°F | Ht 62.0 in | Wt 265.0 lb

## 2021-10-16 DIAGNOSIS — C50212 Malignant neoplasm of upper-inner quadrant of left female breast: Secondary | ICD-10-CM | POA: Diagnosis not present

## 2021-10-16 DIAGNOSIS — E7800 Pure hypercholesterolemia, unspecified: Secondary | ICD-10-CM

## 2021-10-16 DIAGNOSIS — E11620 Type 2 diabetes mellitus with diabetic dermatitis: Secondary | ICD-10-CM | POA: Diagnosis not present

## 2021-10-16 DIAGNOSIS — Z23 Encounter for immunization: Secondary | ICD-10-CM | POA: Diagnosis not present

## 2021-10-16 DIAGNOSIS — Z1211 Encounter for screening for malignant neoplasm of colon: Secondary | ICD-10-CM

## 2021-10-16 DIAGNOSIS — Z6841 Body Mass Index (BMI) 40.0 and over, adult: Secondary | ICD-10-CM

## 2021-10-16 DIAGNOSIS — Z170 Estrogen receptor positive status [ER+]: Secondary | ICD-10-CM

## 2021-10-16 DIAGNOSIS — N644 Mastodynia: Secondary | ICD-10-CM | POA: Diagnosis not present

## 2021-10-16 DIAGNOSIS — I10 Essential (primary) hypertension: Secondary | ICD-10-CM

## 2021-10-16 MED ORDER — TRIAMCINOLONE ACETONIDE 0.1 % EX CREA: 1.0000 | TOPICAL_CREAM | Freq: Two times a day (BID) | CUTANEOUS | 1 refills | Status: DC | PRN

## 2021-10-16 NOTE — Progress Notes (Signed)
I,Tianna Badgett,acting as a Education administrator for [employer], FNP.,have documented all relevant documentation on the behalf of [name], FNP,as directed by  [name], FNP while in the presence of [name], [place].  Subjective:     Patient ID: Ashley Lindsey , female    DOB: May 25, 1960 , 61 y.o.   MRN: [id number]   Chief Complaint  Patient presents with   Hypertension    HPI  Patient presents today for a blood pressure f/u. She had stopped taking propanolol but advised would help with her migraines. She is planning to relocate to [place], [place] in [place] to live in the same area as her daughter.   Wt Readings from Last 3 Encounters: 10/16/21 : 265 lb (120.2 kg) 07/12/21 : 277 lb 6.4 oz (125.8 kg) 07/06/21 : 282 lb (127.9 kg)    Hypertension This is a chronic problem. The current episode started more than 1 year ago. The problem has been gradually improving since onset. The problem is controlled. Pertinent negatives include no anxiety, chest pain, headaches or palpitations. Risk factors for coronary artery disease include sedentary lifestyle and obesity. There are no compliance problems.  There is no history of chronic renal disease.     Past Medical History:  Diagnosis Date   Abdominal cramps    Arthritis    Cancer ([place])    s/p radiation- last dose 6/12//has been off Tamoxifen 8/12   Chronic headaches    Costochondritis    Fatigue    Fibromyalgia 03/06/2013   History of COVID-19    Hypertension    Lymphedema    RIGHT ARM-////  STATES USE LEFT ARM FOR BP'S   Migraines    Muscle spasms of head and/or neck    following to the breast   Passed out    4th of july   Personal history of radiation therapy 2010   lt breast    S/P radiation therapy 08/19/07 - 10/10/07   Left Breast/5040 cGy/28 fractions with Boost for a toatl dose of 6300 dGy   S/P radiation therapy 08/14/10 -10/03/10   right breast   Sleep apnea    STOP BANG SCORE 5     Family History  Problem  Relation Age of Onset   Breast cancer Mother 6   Heart disease Father    Cancer Maternal Aunt 63       d. from "female cancer" possibly cervical   Breast cancer Maternal Aunt 60   Prostate cancer Maternal Uncle 78   Prostate cancer Maternal Uncle 80   Prostate cancer Paternal Uncle    Prostate cancer Paternal Uncle    Prostate cancer Maternal Grandfather 77   Breast cancer Cousin 64       mat 1st cousin     Current Outpatient Medications:    albuterol (VENTOLIN HFA) 108 (90 Base) MCG/ACT inhaler, INHALE 1-2 PUFFS INTO THE LUNGS EVERY 6 (SIX) HOURS AS NEEDED FOR WHEEZING OR SHORTNESS OF BREATH., Disp: 6.7 each, Rfl: 1   aspirin-acetaminophen-caffeine (EXCEDRIN MIGRAINE) 250-250-65 MG tablet, Take 1 tablet by mouth every 6 (six) hours as needed for headache., Disp: , Rfl:    Bisacodyl (DULCOLAX PO), Take 1 capsule by mouth daily as needed (constipation). , Disp: , Rfl:    cholecalciferol 5000 units TABS, Take 1 tablet (5,000 Units total) by mouth 2 (two) times daily. (Patient taking differently: Take 5,000 Units by mouth daily.), Disp: , Rfl:    cyclobenzaprine (FLEXERIL) 10 MG tablet, TAKE 1 TABLET BY MOUTH EVERY  DAY AS NEEDED, Disp: 90 tablet, Rfl: 2   dexlansoprazole (DEXILANT) 60 MG capsule, TAKE 1 CAPSULE BY MOUTH EVERY DAY, Disp: 90 capsule, Rfl: 1   diclofenac Sodium (VOLTAREN) 1 % GEL, Apply 2 g topically 4 (four) times daily., Disp: 2 g, Rfl: 3   diphenhydrAMINE (BENADRYL) 25 MG tablet, Take 1 tablet (25 mg total) by mouth every 6 (six) hours as needed for up to 20 doses (migraine). Take with reglan for migraine headache, Disp: 20 tablet, Rfl: 0   ELDERBERRY PO, Take 1 tablet by mouth daily., Disp: , Rfl:    ENTRESTO 24-26 MG, TAKE 1 TABLET BY MOUTH TWICE A DAY, Disp: 180 tablet, Rfl: 0   fish oil-omega-3 fatty acids 1000 MG capsule, Take 1 g by mouth daily., Disp: , Rfl:    Flaxseed, Linseed, (FLAXSEED OIL MAX STR PO), Take 1 capsule by mouth daily. , Disp: , Rfl:    gabapentin  (NEURONTIN) 300 MG capsule, TAKE 1 CAPSULE BY MOUTH THREE TIMES A DAY, Disp: 90 capsule, Rfl: 3   hydrochlorothiazide (HYDRODIURIL) 25 MG tablet, TAKE 1 TABLET BY MOUTH EVERY DAY, Disp: 90 tablet, Rfl: 1   Iron-FA-B Cmp-C-Biot-Probiotic (FUSION PLUS) CAPS, Take 1 capsule by mouth daily., Disp: 30 capsule, Rfl: 3   meloxicam (MOBIC) 7.5 MG tablet, TAKE 1 TABLET BY MOUTH EVERY DAY, Disp: 90 tablet, Rfl: 0   Multiple Vitamins-Minerals (MULTIVITAMIN & MINERAL PO), Take 1 tablet by mouth daily., Disp: , Rfl:    mupirocin ointment (BACTROBAN) 2 %, Apply 1 application. topically 2 (two) times daily. To affected area till better, Disp: 22 g, Rfl: 0   omeprazole (PRILOSEC) 20 MG capsule, Take 20 mg by mouth daily., Disp: , Rfl:    OVER THE COUNTER MEDICATION, Take 125 mg by mouth daily. [place]/Simethicone, Disp: , Rfl:    spironolactone (ALDACTONE) 25 MG tablet, TAKE 1 TABLET BY MOUTH EVERY DAY IN THE MORNING, Disp: 90 tablet, Rfl: 0   SYMBICORT 80-4.5 MCG/ACT inhaler, INHALE 2 PUFFS INTO THE LUNGS IN THE MORNING AND AT BEDTIME., Disp: 30.6 each, Rfl: 1   topiramate (TOPAMAX) 25 MG tablet, TAKE 1 TABLET (25 MG TOTAL) BY MOUTH DAILY., Disp: 90 tablet, Rfl: 1   Turmeric 500 MG CAPS, Take by mouth., Disp: , Rfl:    venlafaxine XR (EFFEXOR-XR) 75 MG 24 hr capsule, TAKE 1 CAPSULE BY MOUTH DAILY IN COMBINATION WITH XR 150 MG (TO EQUAL 225 MG TOTAL DAILY)., Disp: 90 capsule, Rfl: 3   vitamin E 200 UNIT capsule, Take 200 Units by mouth daily., Disp: , Rfl:    acetaminophen (TYLENOL) 650 MG CR tablet, Take 650 mg by mouth every 8 (eight) hours as needed for pain., Disp: , Rfl:    ondansetron (ZOFRAN) 4 MG tablet, TAKE 1 TABLET BY MOUTH DAILY AS NEEDED FOR NAUSEA OR VOMITING, Disp: 30 tablet, Rfl: 1   triamcinolone cream (KENALOG) 0.1 %, Apply 1 Application topically 2 (two) times daily as needed (rash)., Disp: 80 g, Rfl: 1   venlafaxine XR (EFFEXOR-XR) 150 MG 24 hr capsule, TAKE 1 CAPSULE BY MOUTH NIGHTLY WITH 75 MG  CAPSULE (TOTAL OF 225 MG NIGHTLY), Disp: 90 capsule, Rfl: 0   Allergies  Allergen Reactions   Aspirin     unknown   Penicillins Hives and Swelling    Has patient had a PCN reaction causing immediate rash, facial/tongue/throat swelling, SOB or lightheadedness with hypotension: Yes Has patient had a PCN reaction causing severe rash involving mucus membranes or skin necrosis: Yes  Has patient had a PCN reaction that required hospitalization Yes Has patient had a PCN reaction occurring within the last 10 years: No If all of the above answers are "NO", then may proceed with Cephalosporin use.      Review of Systems  Constitutional: Negative.   Respiratory: Negative.    Cardiovascular: Negative.  Negative for chest pain, palpitations and leg swelling.  Gastrointestinal: Negative.   Neurological: Negative.  Negative for headaches.  Psychiatric/Behavioral: Negative.       Today's Vitals   10/16/21 1135  BP: 122/78  Pulse: 89  Temp: 98.2 F (36.8 C)  TempSrc: Oral  Weight: 265 lb (120.2 kg)  Height: 5' 2"  (1.575 m)   Body mass index is 48.47 kg/m.  Wt Readings from Last 3 Encounters:  10/16/21 265 lb (120.2 kg)  07/12/21 277 lb 6.4 oz (125.8 kg)  07/06/21 282 lb (127.9 kg)    Objective:  Physical Exam Vitals reviewed.  Constitutional:      General: She is not in acute distress.    Appearance: Normal appearance. She is obese.  Cardiovascular:     Rate and Rhythm: Normal rate and regular rhythm.     Pulses: Normal pulses.     Heart sounds: Normal heart sounds. No murmur heard. Pulmonary:     Effort: Pulmonary effort is normal. No respiratory distress.     Breath sounds: Normal breath sounds. No wheezing.  Skin:    General: Skin is warm.     Comments: Dry skin from knee down to ankles.   Neurological:     General: No focal deficit present.     Mental Status: She is alert and oriented to person, place, and time.     Cranial Nerves: No cranial nerve deficit.     Motor:  No weakness.  Psychiatric:        Mood and Affect: Mood normal.        Behavior: Behavior normal.        Thought Content: Thought content normal.        Judgment: Judgment normal.         Assessment And Plan:     1. Essential hypertension Comments: Blood pressure is well controlled.  - BMP8+eGFR  2. Elevated cholesterol - BMP8+eGFR - Lipid panel  3. Type 2 diabetes mellitus with diabetic dermatitis, without long-term current use of insulin (HCC) Comments: Controlled at this time but pending labs may need to start medication - Hemoglobin A1c  4. Malignant neoplasm of upper-inner quadrant of left breast in female, estrogen receptor positive ([place]) Comments: She is not on any treatment and continue with f/u with Oncology  5. Breast tenderness Comments: Tenderness to bilateral breast just under axilla at crease of breast, this sounds like it is musculoskeletal but will check mammogram due history of breast CA - MM Digital Diagnostic Bilat; Future  6. Class 3 severe obesity due to excess calories with serious comorbidity and body mass index (BMI) of 45.0 to 49.9 in adult [facility]) Comments: Congratulated on her weight loss. Continue healthy diet and regular physical activity. She is encouraged to strive for BMI less than 30 to decrease cardiac risk. Advised to aim for at least 150 minutes of exercise per week.  7. Encounter for screening colonoscopy According to USPTF Colorectal cancer Screening guidelines. Colonoscopy is recommended every 10 years, starting at age 53 years. Will refer to GI for colon cancer screening. - Ambulatory referral to Gastroenterology  8. Encounter for immunization TransRx form completed and  received Shingrix #1 - Varicella-zoster vaccine IM (Shingrix)     Patient was given opportunity to ask questions. Patient verbalized understanding of the plan and was able to repeat key elements of the plan. All questions were answered to their satisfaction.  [name], FNP    I, [name], FNP, have reviewed all documentation for this visit. The documentation on 10/16/21 for the exam, diagnosis, procedures, and orders are all accurate and complete.   IF YOU HAVE BEEN REFERRED TO A SPECIALIST, IT MAY TAKE 1-2 WEEKS TO SCHEDULE/PROCESS THE REFERRAL. IF YOU HAVE NOT HEARD FROM US/SPECIALIST IN TWO WEEKS, PLEASE GIVE [place] A CALL AT [phone].   THE PATIENT IS ENCOURAGED TO PRACTICE SOCIAL DISTANCING DUE TO THE COVID-19 PANDEMIC.

## 2021-10-16 NOTE — Progress Notes (Signed)
Patient Care Team: [name], FNP as PCP - General (General Practice)  DIAGNOSIS:  Encounter Diagnosis  Name Primary?   Malignant neoplasm of upper-inner quadrant of left breast in female, estrogen receptor positive ([place])     SUMMARY OF ONCOLOGIC HISTORY: Oncology History  Breast cancer of upper-inner quadrant of left female breast ([place])  05/27/2007 Initial Diagnosis   Left breast biopsy: DCIS with calcifications ER/PR 100% positive, MRI revealed 1.7 x 1.4 x 0.9 cm DCIS at 12:00 position left breast   06/20/2007 Surgery   Left breast lumpectomy: T1 N0 M0 stage IA invasive ductal carcinoma 0.4-0.5 cm grade 1, ER 77%, PR 96%, Ki-67 less than 5%, HER-2 negative   08/21/2007 - 10/10/2007 Radiation Therapy   Adjuvant radiation therapy   01/14/2008 - 01/07/2017 Anti-estrogen oral therapy   Tamoxifen 20 mg daily   Breast cancer of lower-outer quadrant of right female breast ([place])  05/26/2010 Initial Diagnosis   DCIS ER/PR positive   06/16/2010 Surgery   No residual DCIS, extensive fibrocystic changes with focal atypical ductal hyperplasia   08/02/2010 - 09/22/2010 Radiation Therapy   Adjuvant radiation   10/26/2010 - 10/31/2015 Anti-estrogen oral therapy   Tamoxifen 20 mg daily     CHIEF COMPLIANT: Follow-up of bilateral DCIS    INTERVAL HISTORY: Ashley Lindsey is a 61 y.o. with above-mentioned history of bilateral DCIS She presents to the clinic today for a follow-up. She states that her health is up and down. She is going to have knee surgery after she loose some more weight. She has lost 30 pounds. She complains of dizzy spells. She also is going to be relocating to [place]. She will be seeing the nutritionist in  September. She had some concerns on the intensity breast. Every now then she gets shots for her knees for the pain and she is managing the pain in her breast.   ALLERGIES:  is allergic to aspirin and penicillins.  MEDICATIONS:  Current Outpatient Medications  Medication  Sig Dispense Refill   acetaminophen (TYLENOL) 650 MG CR tablet Take 650 mg by mouth every 8 (eight) hours as needed for pain.     albuterol (VENTOLIN HFA) 108 (90 Base) MCG/ACT inhaler INHALE 1-2 PUFFS INTO THE LUNGS EVERY 6 (SIX) HOURS AS NEEDED FOR WHEEZING OR SHORTNESS OF BREATH. 6.7 each 1   aspirin-acetaminophen-caffeine (EXCEDRIN MIGRAINE) 250-250-65 MG tablet Take 1 tablet by mouth every 6 (six) hours as needed for headache.     Bisacodyl (DULCOLAX PO) Take 1 capsule by mouth daily as needed (constipation).      cholecalciferol 5000 units TABS Take 1 tablet (5,000 Units total) by mouth 2 (two) times daily. (Patient taking differently: Take 5,000 Units by mouth daily.)     cyclobenzaprine (FLEXERIL) 10 MG tablet TAKE 1 TABLET BY MOUTH EVERY DAY AS NEEDED 90 tablet 2   dexlansoprazole (DEXILANT) 60 MG capsule TAKE 1 CAPSULE BY MOUTH EVERY DAY 90 capsule 1   diclofenac Sodium (VOLTAREN) 1 % GEL Apply 2 g topically 4 (four) times daily. 2 g 3   diphenhydrAMINE (BENADRYL) 25 MG tablet Take 1 tablet (25 mg total) by mouth every 6 (six) hours as needed for up to 20 doses (migraine). Take with reglan for migraine headache 20 tablet 0   ELDERBERRY PO Take 1 tablet by mouth daily.     ENTRESTO 24-26 MG TAKE 1 TABLET BY MOUTH TWICE A DAY 180 tablet 0   fish oil-omega-3 fatty acids 1000 MG capsule Take 1 g by  mouth daily.     Flaxseed, Linseed, (FLAXSEED OIL MAX STR PO) Take 1 capsule by mouth daily.      gabapentin (NEURONTIN) 300 MG capsule TAKE 1 CAPSULE BY MOUTH THREE TIMES A DAY 90 capsule 3   hydrochlorothiazide (HYDRODIURIL) 25 MG tablet TAKE 1 TABLET BY MOUTH EVERY DAY 90 tablet 1   Iron-FA-B Cmp-C-Biot-Probiotic (FUSION PLUS) CAPS Take 1 capsule by mouth daily. 30 capsule 3   meloxicam (MOBIC) 7.5 MG tablet TAKE 1 TABLET BY MOUTH EVERY DAY 90 tablet 0   Multiple Vitamins-Minerals (MULTIVITAMIN & MINERAL PO) Take 1 tablet by mouth daily.     mupirocin ointment (BACTROBAN) 2 % Apply 1  application. topically 2 (two) times daily. To affected area till better 22 g 0   omeprazole (PRILOSEC) 20 MG capsule Take 20 mg by mouth daily.     ondansetron (ZOFRAN) 4 MG tablet TAKE 1 TABLET BY MOUTH DAILY AS NEEDED FOR NAUSEA OR VOMITING 30 tablet 1   OVER THE COUNTER MEDICATION Take 125 mg by mouth daily. [place]/Simethicone     spironolactone (ALDACTONE) 25 MG tablet TAKE 1 TABLET BY MOUTH EVERY DAY IN THE MORNING 90 tablet 0   SYMBICORT 80-4.5 MCG/ACT inhaler INHALE 2 PUFFS INTO THE LUNGS IN THE MORNING AND AT BEDTIME. 30.6 each 1   topiramate (TOPAMAX) 25 MG tablet TAKE 1 TABLET (25 MG TOTAL) BY MOUTH DAILY. 90 tablet 1   triamcinolone cream (KENALOG) 0.1 % Apply 1 Application topically 2 (two) times daily as needed (rash). 80 g 1   Turmeric 500 MG CAPS Take by mouth.     venlafaxine XR (EFFEXOR-XR) 150 MG 24 hr capsule TAKE 1 CAPSULE BY MOUTH NIGHTLY WITH 75 MG CAPSULE (TOTAL OF 225 MG NIGHTLY) 90 capsule 0   venlafaxine XR (EFFEXOR-XR) 75 MG 24 hr capsule Total dose 225 mg 90 capsule 3   vitamin E 200 UNIT capsule Take 200 Units by mouth daily.     No current facility-administered medications for this visit.    PHYSICAL EXAMINATION: ECOG PERFORMANCE STATUS: 1 - Symptomatic but completely ambulatory  Vitals:   10/30/21 1203  BP: 138/80  Pulse: 97  Resp: 18  Temp: (!) 97.3 F (36.3 C)  SpO2: 100%   Filed Weights   10/30/21 1203  Weight: 270 lb 9.6 oz (122.7 kg)     LABORATORY DATA:  I have reviewed the data as listed    Latest Ref Rng & Units 10/16/2021   12:44 PM 04/06/2021    2:48 AM 02/08/2021   12:00 PM  CMP  Glucose 70 - 99 mg/dL 80  107  96   BUN 8 - 27 mg/dL 12  7  10    Creatinine 0.57 - 1.00 mg/dL 0.66  0.63  0.77   Sodium 134 - 144 mmol/L 138  135  139   Potassium 3.5 - 5.2 mmol/L 4.5  3.7  4.3   Chloride 96 - 106 mmol/L 98  99  97   CO2 20 - 29 mmol/L 24  29  24    Calcium 8.7 - 10.3 mg/dL 9.3  8.6  9.4   Total Protein 6.0 - 8.5 g/dL   7.5   Total  Bilirubin 0.0 - 1.2 mg/dL   <0.2   Alkaline Phos 44 - 121 IU/L   88   AST 0 - 40 IU/L   20   ALT 0 - 32 IU/L   18     Lab Results  Component Value Date  WBC 12.0 (H) 04/06/2021   HGB 9.1 (L) 04/06/2021   HCT 31.8 (L) 04/06/2021   MCV 78.5 (L) 04/06/2021   PLT 361 04/06/2021   NEUTROABS 8.4 (H) 04/06/2021    ASSESSMENT & PLAN:  Breast cancer of upper-inner quadrant of left female breast ([place]) Left breast invasive ductal carcinoma grade 1 ER 77% PR 96% gases on 5% HER-2 negative, T1 AN0M0 stage I A. status post lumpectomy 2.7 2009 followed by radiation therapy completed 10/10/2007, tamoxifen started September 2009 discontinued March 2012 followed by recurrence in the right breast as DCIS 05/26/2010 completed another lumpectomy and radiation therapy and she has been on tamoxifen ever since.   Insufficient tissue for BCI Completed Tamoxifen July 2017   Breast Cancer Surveillance: Mammogram: No mammograms are available to review   Encouraged her to lose weight with intermittent fasting: She lost 30 pounds through dieting and meditation   Severe fibromyalgia related pains    Patient has moved to [place] and will be following at the cancer center in [place] for her follow-ups and mammograms. She had a great experience in [place] and will follow with the medical oncology team over there.   No orders of the defined types were placed in this encounter.  The patient has a good understanding of the overall plan. she agrees with it. she will call with any problems that may develop before the next visit here. Total time spent: 30 mins including face to face time and time spent for planning, charting and co-ordination of care   Harriette Ohara, MD 10/30/21    I Gardiner Coins am scribing for Dr. Lindi Adie  I have reviewed the above documentation for accuracy and completeness, and I agree with the above.

## 2021-10-17 LAB — BMP8+EGFR
BUN/Creatinine Ratio: 18 (ref 12–28)
BUN: 12 mg/dL (ref 8–27)
CO2: 24 mmol/L (ref 20–29)
Calcium: 9.3 mg/dL (ref 8.7–10.3)
Chloride: 98 mmol/L (ref 96–106)
Creatinine, Ser: 0.66 mg/dL (ref 0.57–1.00)
Glucose: 80 mg/dL (ref 70–99)
Potassium: 4.5 mmol/L (ref 3.5–5.2)
Sodium: 138 mmol/L (ref 134–144)
eGFR: 99 mL/min/{1.73_m2} (ref >59)

## 2021-10-17 LAB — LIPID PANEL
Chol/HDL Ratio: 2.8 ratio (ref 0.0–4.4)
Cholesterol, Total: 183 mg/dL (ref 100–199)
HDL: 66 mg/dL (ref >39)
LDL Chol Calc (NIH): 91 mg/dL (ref 0–99)
Triglycerides: 151 mg/dL — ABNORMAL HIGH (ref 0–149)
VLDL Cholesterol Cal: 26 mg/dL (ref 5–40)

## 2021-10-17 LAB — HEMOGLOBIN A1C
Est. average glucose Bld gHb Est-mCnc: 123 mg/dL
Hgb A1c MFr Bld: 5.9 % — ABNORMAL HIGH (ref 4.8–5.6)

## 2021-10-30 ENCOUNTER — Other Ambulatory Visit: Payer: Self-pay

## 2021-10-30 ENCOUNTER — Inpatient Hospital Stay: Payer: Medicare Other | Attending: Hematology and Oncology | Admitting: Hematology and Oncology

## 2021-10-30 DIAGNOSIS — R42 Dizziness and giddiness: Secondary | ICD-10-CM | POA: Insufficient documentation

## 2021-10-30 DIAGNOSIS — Z791 Long term (current) use of non-steroidal anti-inflammatories (NSAID): Secondary | ICD-10-CM | POA: Diagnosis not present

## 2021-10-30 DIAGNOSIS — Z79899 Other long term (current) drug therapy: Secondary | ICD-10-CM | POA: Insufficient documentation

## 2021-10-30 DIAGNOSIS — Z170 Estrogen receptor positive status [ER+]: Secondary | ICD-10-CM

## 2021-10-30 DIAGNOSIS — Z7951 Long term (current) use of inhaled steroids: Secondary | ICD-10-CM | POA: Insufficient documentation

## 2021-10-30 DIAGNOSIS — C50212 Malignant neoplasm of upper-inner quadrant of left female breast: Secondary | ICD-10-CM

## 2021-10-30 DIAGNOSIS — Z923 Personal history of irradiation: Secondary | ICD-10-CM | POA: Diagnosis not present

## 2021-10-30 DIAGNOSIS — Z79810 Long term (current) use of selective estrogen receptor modulators (SERMs): Secondary | ICD-10-CM | POA: Diagnosis not present

## 2021-10-30 MED ORDER — VENLAFAXINE HCL ER 75 MG PO CP24: ORAL_CAPSULE | ORAL | 3 refills | Status: DC

## 2021-10-30 MED ORDER — VENLAFAXINE HCL ER 150 MG PO CP24: ORAL_CAPSULE | ORAL | 0 refills | Status: DC

## 2021-10-30 NOTE — Assessment & Plan Note (Signed)
Left breast invasive ductal carcinoma grade 1 ER 77% PR 96% gases on 5% HER-2 negative, T1 AN0M0 stage I A. status post lumpectomy 2.7 2009 followed by radiation therapy completed 10/10/2007, tamoxifen started September 2009 discontinued March 2012 followed by recurrence in the right breast as DCIS 05/26/2010 completed another lumpectomy and radiation therapy and she has been on tamoxifen ever since.  Insufficient tissue for BCI Completed Tamoxifen July 2017  Breast Cancer Surveillance: 1. Breast exam7/01/2022:Benign, fibromyalgia causing breast tenderness. Also tenderness because of exercises and swimming 2. Mammogram no mammograms are available to review  Encouraged her to lose weight with intermittent fasting.  Severe fibromyalgia related pains:Sent for gabapentin 300 TID, going to see neurology  Return to clinic in 1 year for follow-up

## 2021-11-01 ENCOUNTER — Other Ambulatory Visit: Payer: Self-pay | Admitting: Nurse Practitioner

## 2021-11-01 DIAGNOSIS — N644 Mastodynia: Secondary | ICD-10-CM

## 2021-11-03 DIAGNOSIS — C50911 Malignant neoplasm of unspecified site of right female breast: Secondary | ICD-10-CM | POA: Diagnosis not present

## 2021-11-03 DIAGNOSIS — C50912 Malignant neoplasm of unspecified site of left female breast: Secondary | ICD-10-CM | POA: Diagnosis not present

## 2021-11-07 ENCOUNTER — Ambulatory Visit: Payer: Medicare Other | Admitting: Orthopaedic Surgery

## 2021-11-07 DIAGNOSIS — C50912 Malignant neoplasm of unspecified site of left female breast: Secondary | ICD-10-CM | POA: Diagnosis not present

## 2021-11-07 DIAGNOSIS — C50911 Malignant neoplasm of unspecified site of right female breast: Secondary | ICD-10-CM | POA: Diagnosis not present

## 2021-11-15 ENCOUNTER — Ambulatory Visit: Payer: Medicare Other | Admitting: Orthopaedic Surgery

## 2021-11-20 ENCOUNTER — Encounter: Payer: Self-pay | Admitting: Nurse Practitioner

## 2021-11-22 ENCOUNTER — Other Ambulatory Visit: Payer: Self-pay | Admitting: Nurse Practitioner

## 2021-11-22 ENCOUNTER — Encounter: Payer: Self-pay | Admitting: Nurse Practitioner

## 2021-11-22 NOTE — Telephone Encounter (Signed)
Returned call to patient to confirm which medication she is wanting changed. She reports wanting her entresto changed to propanolol. Advised patient the medications are for different problems and advised to contact cardiology. I did stop her HCTZ as she is on both HCTZ and spironolactone. Return call to office with any additional questions.

## 2021-11-23 ENCOUNTER — Encounter: Payer: Self-pay | Admitting: Cardiology

## 2021-11-24 ENCOUNTER — Encounter: Payer: Self-pay | Admitting: Nurse Practitioner

## 2021-11-24 NOTE — Telephone Encounter (Signed)
From pt

## 2021-11-27 ENCOUNTER — Other Ambulatory Visit: Payer: Self-pay | Admitting: Nurse Practitioner

## 2021-11-27 DIAGNOSIS — M797 Fibromyalgia: Secondary | ICD-10-CM

## 2021-11-28 ENCOUNTER — Encounter: Payer: Self-pay | Admitting: Emergency Medicine

## 2021-11-28 ENCOUNTER — Ambulatory Visit
Admission: EM | Admit: 2021-11-28 | Discharge: 2021-11-28 | Disposition: A | Discharge status: Home / Self Care | Payer: Medicare Other | Attending: Physician Assistant | Admitting: Physician Assistant

## 2021-11-28 DIAGNOSIS — T148XXA Other injury of unspecified body region, initial encounter: Secondary | ICD-10-CM | POA: Diagnosis not present

## 2021-11-28 MED ORDER — METHYLPREDNISOLONE ACETATE 40 MG/ML IJ SUSP
40.0000 mg | Freq: Once | INTRAMUSCULAR | Status: AC
  Administered 2021-11-28: 40 mg via INTRAMUSCULAR

## 2021-11-28 NOTE — ED Provider Notes (Signed)
[facility]    CSN: [id number] Arrival date & time: 11/28/21  1607      History   Chief Complaint Chief Complaint  Patient presents with   Insect Bite    HPI Ashley Lindsey is a 61 y.o. female.   Patient here today for evaluation of possible bug bites on her arms that she first noticed this morning.  Bites are itchy. one bite to her right elbow is somewhat painful as well.  She has tried using hydrocortisone cream topically without significant relief.  She has not had fever.  She is not sure what bit her- she did not see any insects.  The history is provided by the patient.    Past Medical History:  Diagnosis Date   Abdominal cramps    Arthritis    Cancer ([place])    s/p radiation- last dose 6/12//has been off Tamoxifen 8/12   Chronic headaches    Costochondritis    Fatigue    Fibromyalgia 03/06/2013   History of COVID-19    Hypertension    Lymphedema    RIGHT ARM-////  STATES USE LEFT ARM FOR BP'S   Migraines    Muscle spasms of head and/or neck    following to the breast   Passed out    4th of july   Personal history of radiation therapy 2010   lt breast    S/P radiation therapy 08/19/07 - 10/10/07   Left Breast/5040 cGy/28 fractions with Boost for a toatl dose of 6300 dGy   S/P radiation therapy 08/14/10 -10/03/10   right breast   Sleep apnea    STOP BANG SCORE 5    Patient Active Problem List   Diagnosis Date Noted   Arthritis of left knee 10/02/2021   Pulmonary nodules 07/12/2021   Chronic cough 07/12/2021   Avulsion fracture of middle phalanx of finger 04/18/2021   Tibial plateau fracture, left 04/18/2021   Asymmetrical sensorineural hearing loss 02/02/2021   Bilateral primary osteoarthritis of knee 02/09/2020   Chronic intermittent hypoxia with obstructive sleep apnea 08/24/2019   Chronic intractable headache 07/14/2019   Morning headache 07/14/2019   Loud snoring 07/14/2019   Super obesity 07/14/2019   Sleeps in sitting position due to  orthopnea 07/14/2019   Chest pain due to GERD 07/14/2019   Nocturia more than twice per night 07/14/2019   Unilateral primary osteoarthritis, right knee 05/26/2019   Chronic migraine without aura without status migrainosus, not intractable 05/13/2019   Class 3 severe obesity due to excess calories without serious comorbidity with body mass index (BMI) of 40.0 to 44.9 in adult ([place]) 08/05/2018   Depression 08/05/2018   Chronic nonintractable headache 08/05/2018   Elevated blood-pressure reading without diagnosis of hypertension 06/06/2018   Chronic pain of right ankle 04/24/2018   Breast cancer of lower-outer quadrant of right female breast ([place]) 12/02/2014   Bronchospasm 09/21/2014   Dyspnea 09/21/2014   Chronic pain of right knee 09/21/2014   Fibromyalgia 03/06/2013   Dense breasts 03/06/2013   Mastalgia 03/06/2013   Back pain, lumbosacral 03/06/2013   Lymphedema of arm 03/06/2013   Atypical chest pain 01/06/2013   Breast cancer of upper-inner quadrant of left female breast ([place]) 09/08/2012   Family history of malignant neoplasm of breast 09/08/2012   S/P radiation therapy    History of breast cancer in female 08/13/2011    Past Surgical History:  Procedure Laterality Date   ABDOMINAL HYSTERECTOMY     with left salpingooophorectomy   BREAST  LUMPECTOMY  2009/2012   LEFT/RIGHT with lymph node dissection   KNEE ARTHROSCOPY     right   OOPHORECTOMY  2013    OB History   No obstetric history on file.      Home Medications    Prior to Admission medications   Medication Sig Start Date End Date Taking? Authorizing Provider  acetaminophen (TYLENOL) 650 MG CR tablet Take 650 mg by mouth every 8 (eight) hours as needed for pain.    [provider]  albuterol (VENTOLIN HFA) 108 (90 Base) MCG/ACT inhaler INHALE 1-2 PUFFS INTO THE LUNGS EVERY 6 (SIX) HOURS AS NEEDED FOR WHEEZING OR SHORTNESS OF BREATH. 05/03/20   Nicholas Lose, MD  aspirin-acetaminophen-caffeine (EXCEDRIN  MIGRAINE) [phone] MG tablet Take 1 tablet by mouth every 6 (six) hours as needed for headache.    [provider]  Bisacodyl (DULCOLAX PO) Take 1 capsule by mouth daily as needed (constipation).     [provider]  cholecalciferol 5000 units TABS Take 1 tablet (5,000 Units total) by mouth 2 (two) times daily. Patient taking differently: Take 5,000 Units by mouth daily. 07/21/15   Nicholas Lose, MD  cyclobenzaprine (FLEXERIL) 10 MG tablet TAKE 1 TABLET BY MOUTH EVERY DAY AS NEEDED 08/01/21   [name], FNP  dexlansoprazole (DEXILANT) 60 MG capsule TAKE 1 CAPSULE BY MOUTH EVERY DAY 07/10/21   [name], FNP  diclofenac Sodium (VOLTAREN) 1 % GEL Apply 2 g topically 4 (four) times daily. 07/06/21   Raulkar, Clide Deutscher, MD  diphenhydrAMINE (BENADRYL) 25 MG tablet Take 1 tablet (25 mg total) by mouth every 6 (six) hours as needed for up to 20 doses (migraine). Take with reglan for migraine headache 09/11/20   Wyvonnia Dusky, MD  ELDERBERRY PO Take 1 tablet by mouth daily.    [provider]  ENTRESTO 24-26 MG TAKE 1 TABLET BY MOUTH TWICE A DAY 09/19/21   Tolia, Sunit, DO  fish oil-omega-3 fatty acids 1000 MG capsule Take 1 g by mouth daily.    [provider]  Flaxseed, Linseed, (FLAXSEED OIL MAX STR PO) Take 1 capsule by mouth daily.     [provider]  gabapentin (NEURONTIN) 300 MG capsule TAKE 1 CAPSULE BY MOUTH THREE TIMES A DAY 06/27/21   Raulkar, Clide Deutscher, MD  Iron-FA-B Cmp-C-Biot-Probiotic (FUSION PLUS) CAPS Take 1 capsule by mouth daily. 06/23/18   [name], FNP  meloxicam (MOBIC) 7.5 MG tablet TAKE 1 TABLET BY MOUTH EVERY DAY 11/27/21   [name], FNP  Multiple Vitamins-Minerals (MULTIVITAMIN & MINERAL PO) Take 1 tablet by mouth daily.    [provider]  mupirocin ointment (BACTROBAN) 2 % Apply 1 application. topically 2 (two) times daily. To affected area till better 07/21/21   Barrett Henle, MD  omeprazole (PRILOSEC) 20 MG  capsule Take 20 mg by mouth daily.    [provider]  ondansetron (ZOFRAN) 4 MG tablet TAKE 1 TABLET BY MOUTH DAILY AS NEEDED FOR NAUSEA OR VOMITING 10/17/21   [name], FNP  OVER THE COUNTER MEDICATION Take 125 mg by mouth daily. [place]/Simethicone    [provider]  spironolactone (ALDACTONE) 25 MG tablet TAKE 1 TABLET BY MOUTH EVERY DAY IN THE MORNING 10/03/21   Tolia, Sunit, DO  SYMBICORT 80-4.5 MCG/ACT inhaler INHALE 2 PUFFS INTO THE LUNGS IN THE MORNING AND AT BEDTIME. 04/21/21   [name], FNP  topiramate (TOPAMAX) 25 MG tablet TAKE 1 TABLET (25 MG TOTAL) BY MOUTH DAILY. 10/03/21  Izora Ribas, MD  triamcinolone cream (KENALOG) 0.1 % Apply 1 Application topically 2 (two) times daily as needed (rash). 10/16/21   [name], FNP  Turmeric 500 MG CAPS Take by mouth.    [provider]  venlafaxine XR (EFFEXOR-XR) 150 MG 24 hr capsule TAKE 1 CAPSULE BY MOUTH NIGHTLY WITH 75 MG CAPSULE (TOTAL OF 225 MG NIGHTLY) 10/30/21   Nicholas Lose, MD  venlafaxine XR (EFFEXOR-XR) 75 MG 24 hr capsule Total dose 225 mg 10/30/21   Nicholas Lose, MD  vitamin E 200 UNIT capsule Take 200 Units by mouth daily.    [provider]    Family History Family History  Problem Relation Age of Onset   Breast cancer Mother 44   Heart disease Father    Cancer Maternal Aunt 15       d. from "female cancer" possibly cervical   Breast cancer Maternal Aunt 60   Prostate cancer Maternal Uncle 78   Prostate cancer Maternal Uncle 80   Prostate cancer Paternal Uncle    Prostate cancer Paternal Uncle    Prostate cancer Maternal Grandfather 18   Breast cancer Cousin 42       mat 1st cousin    Social History Social History   Tobacco Use   Smoking status: Former    Packs/day: 0.25    Years: 1.00    Total pack years: 0.25    Types: Cigarettes   Smokeless tobacco: Never  Vaping Use   Vaping Use: Never used  Substance Use Topics   Alcohol use: Not Currently    Drug use: No     Allergies   Aspirin and Penicillins   Review of Systems Review of Systems  Constitutional:  Negative for chills and fever.  Eyes:  Negative for discharge and redness.  Respiratory:  Negative for shortness of breath.   Gastrointestinal:  Negative for nausea and vomiting.  Skin:  Positive for color change. Negative for wound.     Physical Exam Triage Vital Signs ED Triage Vitals [11/28/21 1625]  Enc Vitals Group     BP (!) 145/96     Pulse Rate 99     Resp 18     Temp 98.2 F (36.8 C)     Temp src      SpO2 97 %     Weight      Height      Head Circumference      Peak Flow      Pain Score 0     Pain Loc      Pain Edu?      Excl. in [place]?    No data found.  Updated Vital Signs BP (!) 145/96   Pulse 99   Temp 98.2 F (36.8 C)   Resp 18   SpO2 97%       Physical Exam Vitals and nursing note reviewed.  Constitutional:      General: She is not in acute distress.    Appearance: Normal appearance. She is not ill-appearing.  HENT:     Head: Normocephalic and atraumatic.  Eyes:     Conjunctiva/sclera: Conjunctivae normal.  Cardiovascular:     Rate and Rhythm: Normal rate.  Pulmonary:     Effort: Pulmonary effort is normal.  Skin:    Comments: 6-8 total Scattered erythematous large papules to bilateral arms  Neurological:     Mental Status: She is alert.  Psychiatric:        Mood and Affect:  Mood normal.        Behavior: Behavior normal.        Thought Content: Thought content normal.      UC Treatments / Results  Labs (all labs ordered are listed, but only abnormal results are displayed) Labs Reviewed - No data to display  EKG   Radiology No results found.  Procedures Procedures (including critical care time)  Medications Ordered in UC Medications  methylPREDNISolone acetate (DEPO-MEDROL) injection 40 mg (40 mg Intramuscular Given 11/28/21 1646)    Initial Impression / Assessment and Plan / UC Course  I have reviewed the  triage vital signs and the nursing notes.  Pertinent labs & imaging results that were available during my care of the patient were reviewed by me and considered in my medical decision making (see chart for details).    Lesions do appear consistent with bites however unable to determine type of bite- discussed bed bugs vs mosquitos given exquisite itchiness.  Will treat with steroid injection and recommended follow-up with any further concerns.  Discussed that she can continue hydrocortisone topically to help with itching as well.  Final Clinical Impressions(s) / UC Diagnoses   Final diagnoses:  Bites   Discharge Instructions   None    ED Prescriptions   None    PDMP not reviewed this encounter.   Francene Finders, PA-C 11/28/21 [phone]

## 2021-11-28 NOTE — Telephone Encounter (Signed)
Spoke the patient over the phone. Her questions and concerns addressed.   Dr. Terri Skains

## 2021-11-28 NOTE — ED Triage Notes (Signed)
Pt is present today with possible bug bites on her arms that she noticed this morning.

## 2021-11-29 ENCOUNTER — Ambulatory Visit
Admission: EM | Admit: 2021-11-29 | Discharge: 2021-11-29 | Disposition: A | Discharge status: Home / Self Care | Payer: Medicare Other | Attending: Internal Medicine | Admitting: Internal Medicine

## 2021-11-29 DIAGNOSIS — R21 Rash and other nonspecific skin eruption: Secondary | ICD-10-CM | POA: Diagnosis not present

## 2021-11-29 DIAGNOSIS — L509 Urticaria, unspecified: Secondary | ICD-10-CM | POA: Diagnosis not present

## 2021-11-29 MED ORDER — MUPIROCIN 2 % EX OINT: 1.0000 | TOPICAL_OINTMENT | Freq: Two times a day (BID) | CUTANEOUS | 0 refills | Status: AC

## 2021-11-29 MED ORDER — PREDNISONE 20 MG PO TABS: 20.0000 mg | ORAL_TABLET | Freq: Every day | ORAL | 0 refills | Status: AC

## 2021-11-29 NOTE — ED Provider Notes (Signed)
[facility]    CSN: [id number] Arrival date & time: 11/29/21  1219      History   Chief Complaint Chief Complaint  Patient presents with   Pruritis    HPI Ashley Lindsey is a 61 y.o. female.   Patient presents for persistent itchiness and rash to bilateral arms that has been present for a few days.  Patient was seen yesterday in urgent care and given IM Solu-Medrol with no improvement.  Patient also reports that she has been using triamcinolone cream, hydrocortisone cream, Benadryl with no improvement.  Denies any changes in environment including lotions, soaps, detergents, foods, etc.  Patient denies that any insect or spider has bitten her that she is aware of.  Patient reports that she has an exterminator coming out to her house today to ensure that there is no infestation present. Denies feelings of throat closing or shortness of breath.      Past Medical History:  Diagnosis Date   Abdominal cramps    Arthritis    Cancer ([place])    s/p radiation- last dose 6/12//has been off Tamoxifen 8/12   Chronic headaches    Costochondritis    Fatigue    Fibromyalgia 03/06/2013   History of COVID-19    Hypertension    Lymphedema    RIGHT ARM-////  STATES USE LEFT ARM FOR BP'S   Migraines    Muscle spasms of head and/or neck    following to the breast   Passed out    4th of july   Personal history of radiation therapy 2010   lt breast    S/P radiation therapy 08/19/07 - 10/10/07   Left Breast/5040 cGy/28 fractions with Boost for a toatl dose of 6300 dGy   S/P radiation therapy 08/14/10 -10/03/10   right breast   Sleep apnea    STOP BANG SCORE 5    Patient Active Problem List   Diagnosis Date Noted   Arthritis of left knee 10/02/2021   Pulmonary nodules 07/12/2021   Chronic cough 07/12/2021   Avulsion fracture of middle phalanx of finger 04/18/2021   Tibial plateau fracture, left 04/18/2021   Asymmetrical sensorineural hearing loss 02/02/2021   Bilateral  primary osteoarthritis of knee 02/09/2020   Chronic intermittent hypoxia with obstructive sleep apnea 08/24/2019   Chronic intractable headache 07/14/2019   Morning headache 07/14/2019   Loud snoring 07/14/2019   Super obesity 07/14/2019   Sleeps in sitting position due to orthopnea 07/14/2019   Chest pain due to GERD 07/14/2019   Nocturia more than twice per night 07/14/2019   Unilateral primary osteoarthritis, right knee 05/26/2019   Chronic migraine without aura without status migrainosus, not intractable 05/13/2019   Class 3 severe obesity due to excess calories without serious comorbidity with body mass index (BMI) of 40.0 to 44.9 in adult ([place]) 08/05/2018   Depression 08/05/2018   Chronic nonintractable headache 08/05/2018   Elevated blood-pressure reading without diagnosis of hypertension 06/06/2018   Chronic pain of right ankle 04/24/2018   Breast cancer of lower-outer quadrant of right female breast ([place]) 12/02/2014   Bronchospasm 09/21/2014   Dyspnea 09/21/2014   Chronic pain of right knee 09/21/2014   Fibromyalgia 03/06/2013   Dense breasts 03/06/2013   Mastalgia 03/06/2013   Back pain, lumbosacral 03/06/2013   Lymphedema of arm 03/06/2013   Atypical chest pain 01/06/2013   Breast cancer of upper-inner quadrant of left female breast ([place]) 09/08/2012   Family history of malignant neoplasm of breast 09/08/2012  S/P radiation therapy    History of breast cancer in female 08/13/2011    Past Surgical History:  Procedure Laterality Date   ABDOMINAL HYSTERECTOMY     with left salpingooophorectomy   BREAST LUMPECTOMY  2009/2012   LEFT/RIGHT with lymph node dissection   KNEE ARTHROSCOPY     right   OOPHORECTOMY  2013    OB History   No obstetric history on file.      Home Medications    Prior to Admission medications   Medication Sig Start Date End Date Taking? Authorizing Provider  mupirocin ointment (BACTROBAN) 2 % Apply 1 Application topically 2 (two) times  daily. 11/29/21  Yes Haydyn Liddell, Hildred Alamin E, FNP  predniSONE (DELTASONE) 20 MG tablet Take 1 tablet (20 mg total) by mouth daily for 5 days. 11/29/21 12/04/21 Yes Holly Pring, Michele Rockers, FNP  acetaminophen (TYLENOL) 650 MG CR tablet Take 650 mg by mouth every 8 (eight) hours as needed for pain.    [provider]  albuterol (VENTOLIN HFA) 108 (90 Base) MCG/ACT inhaler INHALE 1-2 PUFFS INTO THE LUNGS EVERY 6 (SIX) HOURS AS NEEDED FOR WHEEZING OR SHORTNESS OF BREATH. 05/03/20   Nicholas Lose, MD  aspirin-acetaminophen-caffeine (EXCEDRIN MIGRAINE) [phone] MG tablet Take 1 tablet by mouth every 6 (six) hours as needed for headache.    [provider]  Bisacodyl (DULCOLAX PO) Take 1 capsule by mouth daily as needed (constipation).     [provider]  cholecalciferol 5000 units TABS Take 1 tablet (5,000 Units total) by mouth 2 (two) times daily. Patient taking differently: Take 5,000 Units by mouth daily. 07/21/15   Nicholas Lose, MD  cyclobenzaprine (FLEXERIL) 10 MG tablet TAKE 1 TABLET BY MOUTH EVERY DAY AS NEEDED 08/01/21   [name], FNP  dexlansoprazole (DEXILANT) 60 MG capsule TAKE 1 CAPSULE BY MOUTH EVERY DAY 07/10/21   [name], FNP  diclofenac Sodium (VOLTAREN) 1 % GEL Apply 2 g topically 4 (four) times daily. 07/06/21   Raulkar, Clide Deutscher, MD  diphenhydrAMINE (BENADRYL) 25 MG tablet Take 1 tablet (25 mg total) by mouth every 6 (six) hours as needed for up to 20 doses (migraine). Take with reglan for migraine headache 09/11/20   Wyvonnia Dusky, MD  ELDERBERRY PO Take 1 tablet by mouth daily.    [provider]  ENTRESTO 24-26 MG TAKE 1 TABLET BY MOUTH TWICE A DAY 09/19/21   Tolia, Sunit, DO  fish oil-omega-3 fatty acids 1000 MG capsule Take 1 g by mouth daily.    [provider]  Flaxseed, Linseed, (FLAXSEED OIL MAX STR PO) Take 1 capsule by mouth daily.     [provider]  gabapentin (NEURONTIN) 300 MG capsule TAKE 1 CAPSULE BY MOUTH THREE TIMES A DAY  06/27/21   Raulkar, Clide Deutscher, MD  Iron-FA-B Cmp-C-Biot-Probiotic (FUSION PLUS) CAPS Take 1 capsule by mouth daily. 06/23/18   [name], FNP  meloxicam (MOBIC) 7.5 MG tablet TAKE 1 TABLET BY MOUTH EVERY DAY 11/27/21   [name], FNP  Multiple Vitamins-Minerals (MULTIVITAMIN & MINERAL PO) Take 1 tablet by mouth daily.    [provider]  omeprazole (PRILOSEC) 20 MG capsule Take 20 mg by mouth daily.    [provider]  ondansetron (ZOFRAN) 4 MG tablet TAKE 1 TABLET BY MOUTH DAILY AS NEEDED FOR NAUSEA OR VOMITING 10/17/21   [name], FNP  OVER THE COUNTER MEDICATION Take 125 mg by mouth daily. [place]/Simethicone    [provider]  spironolactone (ALDACTONE) 25  MG tablet TAKE 1 TABLET BY MOUTH EVERY DAY IN THE MORNING 10/03/21   Tolia, Sunit, DO  SYMBICORT 80-4.5 MCG/ACT inhaler INHALE 2 PUFFS INTO THE LUNGS IN THE MORNING AND AT BEDTIME. 04/21/21   [name], FNP  topiramate (TOPAMAX) 25 MG tablet TAKE 1 TABLET (25 MG TOTAL) BY MOUTH DAILY. 10/03/21   Raulkar, Clide Deutscher, MD  triamcinolone cream (KENALOG) 0.1 % Apply 1 Application topically 2 (two) times daily as needed (rash). 10/16/21   [name], FNP  Turmeric 500 MG CAPS Take by mouth.    [provider]  venlafaxine XR (EFFEXOR-XR) 150 MG 24 hr capsule TAKE 1 CAPSULE BY MOUTH NIGHTLY WITH 75 MG CAPSULE (TOTAL OF 225 MG NIGHTLY) 10/30/21   Nicholas Lose, MD  venlafaxine XR (EFFEXOR-XR) 75 MG 24 hr capsule Total dose 225 mg 10/30/21   Nicholas Lose, MD  vitamin E 200 UNIT capsule Take 200 Units by mouth daily.    [provider]    Family History Family History  Problem Relation Age of Onset   Breast cancer Mother 48   Heart disease Father    Cancer Maternal Aunt 64       d. from "female cancer" possibly cervical   Breast cancer Maternal Aunt 60   Prostate cancer Maternal Uncle 78   Prostate cancer Maternal Uncle 80   Prostate cancer Paternal Uncle    Prostate cancer Paternal Uncle     Prostate cancer Maternal Grandfather 65   Breast cancer Cousin 70       mat 1st cousin    Social History Social History   Tobacco Use   Smoking status: Former    Packs/day: 0.25    Years: 1.00    Total pack years: 0.25    Types: Cigarettes   Smokeless tobacco: Never  Vaping Use   Vaping Use: Never used  Substance Use Topics   Alcohol use: Not Currently   Drug use: No     Allergies   Aspirin and Penicillins   Review of Systems Review of Systems Per HPI  Physical Exam Triage Vital Signs ED Triage Vitals  Enc Vitals Group     BP 11/29/21 1227 135/83     Pulse Rate 11/29/21 1227 98     Resp 11/29/21 1227 18     Temp 11/29/21 1227 98 F (36.7 C)     Temp Source 11/29/21 1227 Oral     SpO2 11/29/21 1227 94 %     Weight --      Height --      Head Circumference --      Peak Flow --      Pain Score 11/29/21 1229 0     Pain Loc --      Pain Edu? --      Excl. in [place]? --    No data found.  Updated Vital Signs BP 135/83 (BP Location: Left Arm)   Pulse 98   Temp 98 F (36.7 C) (Oral)   Resp 18   SpO2 97%   Visual Acuity Right Eye Distance:   Left Eye Distance:   Bilateral Distance:    Right Eye Near:   Left Eye Near:    Bilateral Near:     Physical Exam Constitutional:      General: She is not in acute distress.    Appearance: Normal appearance. She is not toxic-appearing or diaphoretic.  HENT:     Head: Normocephalic and atraumatic.  Eyes:  Extraocular Movements: Extraocular movements intact.     Conjunctiva/sclera: Conjunctivae normal.  Pulmonary:     Effort: Pulmonary effort is normal.  Skin:    Comments: Scattered, Singular erythematous papular lesions present throughout bilateral arms.  No drainage noted.  Neurological:     General: No focal deficit present.     Mental Status: She is alert and oriented to person, place, and time. Mental status is at baseline.  Psychiatric:        Mood and Affect: Mood normal.        Behavior:  Behavior normal.        Thought Content: Thought content normal.        Judgment: Judgment normal.      UC Treatments / Results  Labs (all labs ordered are listed, but only abnormal results are displayed) Labs Reviewed - No data to display  EKG   Radiology No results found.  Procedures Procedures (including critical care time)  Medications Ordered in UC Medications - No data to display  Initial Impression / Assessment and Plan / UC Course  I have reviewed the triage vital signs and the nursing notes.  Pertinent labs & imaging results that were available during my care of the patient were reviewed by me and considered in my medical decision making (see chart for details).     Rash appears consistent with possible insect bites.  Other differential diagnoses include contact dermatitis.  Symptoms have been refractory to IM steroids, antihistamine, topical steroids so will opt to treat with oral steroids.  Patient reports that she has taken oral steroids previously and has tolerated well.  There does not appear to be any obvious contraindications to prednisone in patient's history at this time.  Will do low-dose and short course of prednisone.  Advised patient to follow-up if symptoms persist or worsen.  Patient verbalized understanding and was agreeable with plan. Final Clinical Impressions(s) / UC Diagnoses   Final diagnoses:  Rash and nonspecific skin eruption  Urticaria     Discharge Instructions      You have been prescribed prednisone steroid to decrease inflammation, itching, rash.  A cream has also been prescribed to apply directly to area to prevent infection.  Please follow-up with primary care doctor if symptoms persist or worsen.    ED Prescriptions     Medication Sig Dispense Auth. Provider   predniSONE (DELTASONE) 20 MG tablet Take 1 tablet (20 mg total) by mouth daily for 5 days. 5 tablet [place], [place] E, [place]   mupirocin ointment (BACTROBAN) 2 % Apply 1  Application topically 2 (two) times daily. 22 g Teodora Medici, [place]      PDMP not reviewed this encounter.   Teodora Medici, [place] 11/29/21 1243

## 2021-11-29 NOTE — Discharge Instructions (Signed)
You have been prescribed prednisone steroid to decrease inflammation, itching, rash.  A cream has also been prescribed to apply directly to area to prevent infection.  Please follow-up with primary care doctor if symptoms persist or worsen.

## 2021-11-29 NOTE — ED Triage Notes (Signed)
Pt presents with itchiness all over both arms that is unrelieved with OTC medicines since yesterday.

## 2021-11-30 ENCOUNTER — Other Ambulatory Visit: Payer: Medicare Other

## 2021-12-05 ENCOUNTER — Other Ambulatory Visit: Payer: Self-pay | Admitting: Nurse Practitioner

## 2021-12-05 ENCOUNTER — Other Ambulatory Visit: Payer: Self-pay | Admitting: Cardiology

## 2021-12-05 DIAGNOSIS — I429 Cardiomyopathy, unspecified: Secondary | ICD-10-CM

## 2021-12-05 DIAGNOSIS — F32A Depression, unspecified: Secondary | ICD-10-CM

## 2021-12-05 DIAGNOSIS — R519 Headache, unspecified: Secondary | ICD-10-CM

## 2021-12-07 ENCOUNTER — Encounter: Payer: Self-pay | Admitting: Physical Medicine and Rehabilitation

## 2021-12-07 ENCOUNTER — Encounter: Payer: Self-pay | Admitting: Nurse Practitioner

## 2021-12-11 ENCOUNTER — Encounter: Payer: Self-pay | Admitting: Nurse Practitioner

## 2021-12-14 ENCOUNTER — Ambulatory Visit: Payer: Medicare Other | Admitting: Cardiology

## 2021-12-14 ENCOUNTER — Encounter: Payer: Self-pay | Admitting: Cardiology

## 2021-12-14 VITALS — BP 101/74 | HR 95 | Temp 97.7°F | Resp 16 | Ht 62.0 in | Wt 265.4 lb

## 2021-12-14 DIAGNOSIS — Z87898 Personal history of other specified conditions: Secondary | ICD-10-CM | POA: Diagnosis not present

## 2021-12-14 DIAGNOSIS — G4733 Obstructive sleep apnea (adult) (pediatric): Secondary | ICD-10-CM | POA: Diagnosis not present

## 2021-12-14 DIAGNOSIS — I10 Essential (primary) hypertension: Secondary | ICD-10-CM

## 2021-12-14 DIAGNOSIS — R911 Solitary pulmonary nodule: Secondary | ICD-10-CM

## 2021-12-14 DIAGNOSIS — I429 Cardiomyopathy, unspecified: Secondary | ICD-10-CM | POA: Diagnosis not present

## 2021-12-14 MED ORDER — PROPRANOLOL HCL 10 MG PO TABS: 10.0000 mg | ORAL_TABLET | Freq: Two times a day (BID) | ORAL | 0 refills | Status: DC

## 2021-12-14 NOTE — Progress Notes (Signed)
Date:  12/14/2021   ID:  Ashley Lindsey, DOB 10/09/1960, MRN [id number]  PCP:  [name], FNP  Cardiologist: Rex Kras, DO, [facility]  (established care 11/11/2020)  Date: 12/14/21 Last Office Visit: 06/15/2021  Chief Complaint  Patient presents with   Cardiomyopathy   Follow-up   HPI  Ashley Lindsey is a 61 y.o. female whose past medical history and cardiovascular risk factors include: Cardiomyopathy, history of COVID-19 infection, TIA, HTN, OSA (CPAP as Nov 2022), fibromyalgia, history of breast cancer bilaterally status post chemotherapy and radiation, former smoker, obesity due to excess calories.  In the past she had an episode of vasovagal syncope and was taken to the ED as code stroke.  During the work-up she was noted to have a mildly reduced LVEF likely secondary to her history of chemotherapy and radiation for bilateral breast cancer.  Given her syncopal event and mildly reduced LVEF she underwent nuclear stress test which was reported to be low risk study and a cardiac monitor was overall unremarkable.  She now presents for follow-up.  Doing well from a cardiovascular standpoint.  No anginal discomfort or heart failure symptoms.  Overall functional capacity remains relatively stable.  She has lost 16 pounds since last office visit due to lifestyle changes.  Unfortunately she recently lost her nephew and is still grieving provided my deepest condolences.  No recent hospitalizations for cardiovascular symptoms.  At the last office visit I had discontinued both propranolol and hydrochlorothiazide and started her on low-dose Entresto, spironolactone, and metoprolol.  However, patient did not tolerate metoprolol well and requested to transition back to propranolol.  She currently takes propranolol on as-needed basis approximately 3 times a week.  The same is true for hydrochlorothiazide.  ALLERGIES: Allergies  Allergen Reactions   Aspirin     unknown   Penicillins Hives and  Swelling    Has patient had a PCN reaction causing immediate rash, facial/tongue/throat swelling, SOB or lightheadedness with hypotension: Yes Has patient had a PCN reaction causing severe rash involving mucus membranes or skin necrosis: Yes Has patient had a PCN reaction that required hospitalization Yes Has patient had a PCN reaction occurring within the last 10 years: No If all of the above answers are "NO", then may proceed with Cephalosporin use.     MEDICATION LIST PRIOR TO VISIT: Current Meds  Medication Sig   acetaminophen (TYLENOL) 650 MG CR tablet Take 650 mg by mouth every 8 (eight) hours as needed for pain.   albuterol (VENTOLIN HFA) 108 (90 Base) MCG/ACT inhaler INHALE 1-2 PUFFS INTO THE LUNGS EVERY 6 (SIX) HOURS AS NEEDED FOR WHEEZING OR SHORTNESS OF BREATH.   aspirin-acetaminophen-caffeine (EXCEDRIN MIGRAINE) 250-250-65 MG tablet Take 1 tablet by mouth every 6 (six) hours as needed for headache.   Bisacodyl (DULCOLAX PO) Take 1 capsule by mouth daily as needed (constipation).    cholecalciferol 5000 units TABS Take 1 tablet (5,000 Units total) by mouth 2 (two) times daily. (Patient taking differently: Take 5,000 Units by mouth daily.)   cyclobenzaprine (FLEXERIL) 10 MG tablet TAKE 1 TABLET BY MOUTH EVERY DAY AS NEEDED   dexlansoprazole (DEXILANT) 60 MG capsule TAKE 1 CAPSULE BY MOUTH EVERY DAY   diclofenac Sodium (VOLTAREN) 1 % GEL Apply 2 g topically 4 (four) times daily.   diphenhydrAMINE (BENADRYL) 25 MG tablet Take 1 tablet (25 mg total) by mouth every 6 (six) hours as needed for up to 20 doses (migraine). Take with reglan for migraine headache   ELDERBERRY  PO Take 1 tablet by mouth daily.   ENTRESTO 24-26 MG TAKE 1 TABLET BY MOUTH TWICE A DAY   fish oil-omega-3 fatty acids 1000 MG capsule Take 1 g by mouth daily.   Flaxseed, Linseed, (FLAXSEED OIL MAX STR PO) Take 1 capsule by mouth daily.    gabapentin (NEURONTIN) 300 MG capsule TAKE 1 CAPSULE BY MOUTH THREE TIMES A DAY    Iron-FA-B Cmp-C-Biot-Probiotic (FUSION PLUS) CAPS Take 1 capsule by mouth daily.   meloxicam (MOBIC) 7.5 MG tablet TAKE 1 TABLET BY MOUTH EVERY DAY   Multiple Vitamins-Minerals (MULTIVITAMIN & MINERAL PO) Take 1 tablet by mouth daily.   mupirocin ointment (BACTROBAN) 2 % Apply 1 Application topically 2 (two) times daily.   omeprazole (PRILOSEC) 20 MG capsule Take 20 mg by mouth daily.   ondansetron (ZOFRAN) 4 MG tablet TAKE 1 TABLET BY MOUTH DAILY AS NEEDED FOR NAUSEA OR VOMITING   OVER THE COUNTER MEDICATION Take 125 mg by mouth daily. [place]/Simethicone   propranolol (INDERAL) 10 MG tablet Take 1 tablet (10 mg total) by mouth 2 (two) times daily. Hold if systolic blood pressure (top number) less than 100 mmHg or pulse less than 60 bpm.   spironolactone (ALDACTONE) 25 MG tablet TAKE 1 TABLET BY MOUTH EVERY DAY IN THE MORNING   SYMBICORT 80-4.5 MCG/ACT inhaler INHALE 2 PUFFS INTO THE LUNGS IN THE MORNING AND AT BEDTIME.   topiramate (TOPAMAX) 25 MG tablet TAKE 1 TABLET (25 MG TOTAL) BY MOUTH DAILY.   triamcinolone cream (KENALOG) 0.1 % Apply 1 Application topically 2 (two) times daily as needed (rash).   Turmeric 500 MG CAPS Take by mouth.   venlafaxine XR (EFFEXOR-XR) 150 MG 24 hr capsule TAKE 1 CAPSULE BY MOUTH NIGHTLY WITH 75 MG CAPSULE (TOTAL OF 225 MG NIGHTLY)   venlafaxine XR (EFFEXOR-XR) 75 MG 24 hr capsule Total dose 225 mg   vitamin E 200 UNIT capsule Take 200 Units by mouth daily.     PAST MEDICAL HISTORY: Past Medical History:  Diagnosis Date   Abdominal cramps    Arthritis    Cancer ([place])    s/p radiation- last dose 6/12//has been off Tamoxifen 8/12   Chronic headaches    Costochondritis    Fatigue    Fibromyalgia 03/06/2013   History of COVID-19    Hypertension    Lymphedema    RIGHT ARM-////  STATES USE LEFT ARM FOR BP'S   Migraines    Muscle spasms of head and/or neck    following to the breast   Passed out    4th of july   Personal history of radiation  therapy 2010   lt breast    S/P radiation therapy 08/19/07 - 10/10/07   Left Breast/5040 cGy/28 fractions with Boost for a toatl dose of 6300 dGy   S/P radiation therapy 08/14/10 -10/03/10   right breast   Sleep apnea    STOP BANG SCORE 5    PAST SURGICAL HISTORY: Past Surgical History:  Procedure Laterality Date   ABDOMINAL HYSTERECTOMY     with left salpingooophorectomy   BREAST LUMPECTOMY  2009/2012   LEFT/RIGHT with lymph node dissection   KNEE ARTHROSCOPY     right   OOPHORECTOMY  2013    FAMILY HISTORY: The patient family history includes Breast cancer (age of onset: 63) in her cousin; Breast cancer (age of onset: 79) in her maternal aunt; Breast cancer (age of onset: 46) in her mother; Cancer (age of onset: 51) in her maternal  aunt; Heart disease in her father; Prostate cancer in her paternal uncle and paternal uncle; Prostate cancer (age of onset: 63) in her maternal uncle; Prostate cancer (age of onset: 47) in her maternal uncle; Prostate cancer (age of onset: 72) in her maternal grandfather.  SOCIAL HISTORY:  The patient  reports that she has quit smoking. Her smoking use included cigarettes. She has a 0.25 pack-year smoking history. She has never used smokeless tobacco. She reports that she does not currently use alcohol. She reports that she does not use drugs.  REVIEW OF SYSTEMS: Review of Systems  Cardiovascular:  Negative for chest pain, dyspnea on exertion, leg swelling, orthopnea, palpitations, paroxysmal nocturnal dyspnea and syncope.    PHYSICAL EXAM:    12/14/2021   10:46 AM 11/29/2021   12:27 PM 11/28/2021    4:25 PM  Vitals with BMI  Height '5\' 2"'$     Weight 265 lbs 6 oz    BMI 09.73    Systolic 532 992 426  Diastolic 74 83 96  Pulse 95 98 99    CONSTITUTIONAL: Appears older than stated age, hemodynamically stable well-developed and well-nourished. No acute distress.  SKIN: Skin is warm and dry. No rash noted. No cyanosis. No pallor. No jaundice HEAD:  Normocephalic and atraumatic.  EYES: No scleral icterus MOUTH/THROAT: Moist oral membranes.  NECK: Unable to evaluate JVP due to short neck stature and adipose tissue, no thyromegaly noted. No carotid bruits  CHEST Normal respiratory effort. No intercostal retractions  LUNGS: Clear to auscultation bilaterally.  No stridor. No wheezes. No rales.  CARDIOVASCULAR: Regular rate and rhythm, positive S1-S2, no murmurs rubs or gallops appreciated ABDOMINAL: Obese, soft, nontender, nondistended, positive bowel sounds in all 4 quadrants no apparent ascites.  EXTREMITIES: Trace bilateral peripheral edema, palpable DP and PT pulses. HEMATOLOGIC: No significant bruising NEUROLOGIC: Oriented to person, place, and time. Nonfocal. Normal muscle tone.  PSYCHIATRIC: Normal mood and affect. Normal behavior. Cooperative No change in physical examination last office visit.  CARDIAC DATABASE: EKG: 12/14/2021: Sinus rhythm, 94 bpm, LVH per voltage criteria, without underlying injury pattern  Echo:  12/08/2020:  Endocardial definition is not well visualized. This may limit accuracy.  Consider alternate imaging studies, if clinically indicated.   Left  ventricle cavity is normal in size. Mild concentric hypertrophy of the left ventricle. Mild global hypokinesis. LVEF probably 45%. Doppler evidence of grade I (impaired) diastolic dysfunction, normal LAP.  Left atrial cavity is mildly dilated.  Mild (Grade I) mitral regurgitation.  Mild tricuspid regurgitation.  No evidence of pulmonary hypertension.  Stress Test: Lexiscan Tetrofosmin stress test 12/05/2020: Lexiscan nuclear stress test performed using 1-day protocol. Mildly decreased tracer uptake in inferior myocardium, likely due to gut attenuation. Stress LVEF 62%. Low risk study.   CTA Head and Neck w/ and w/o contrast: 09/11/2020: 1. CT perfusion negative for acute infarct or ischemia 2. Negative for intracranial large vessel occlusion 3. No  significant carotid or vertebral artery stenosis in the neck. No intracranial stenosis. 4. Code stroke imaging results were communicated on 09/11/2020 at 2:34 pm to provider Rory Percy via text page  CT Head w/o contrast: 09/11/2020 1. Negative CT head 2. ASPECTS is 10 3. Code stroke imaging results were communicated on 09/11/2020 at 1:59 pm to provider Rory Percy via text page  MRI brain with and without contrast: 10/04/2020 1. No specific cause for symptoms.  No acute or subacute infarct. 2. Mild white matter disease without specific demyelinating pattern.  Carotid artery duplex 12/08/2020:  The bifurcation,  internal, external and common carotid arteries reveal no  evidence of significant stenosis, bilaterally. No significant plaque  burden noted bilaterally.  Right vertebral artery flow is not visualized. Left vertebral artery flow  is not visualized.  14-day mobile cardiac ambulatory telemetry: Patch Wear Time: 12 days and 0 hours  Dominant rhythm normal sinus, followed by sinus tachycardia (15% burden).  Heart rate 68-203 bpm. Avg HR 91 bpm. No atrial fibrillation, ventricular tachycardia, high grade AV block, pauses (3 seconds or longer). One episode of SVT, 5 beat duration, max HR of 203 bpm, and average 191 bpm.  Total ventricular ectopic burden <1%. Total supraventricular ectopic burden <1%. Patient triggered events: 6. These did not correlate with any significant dysrhythmias.  LABORATORY DATA:    Latest Ref Rng & Units 04/06/2021    2:48 AM 02/08/2021   12:00 PM 10/05/2020   11:41 AM  CBC  WBC 4.0 - 10.5 K/uL 12.0  10.9  9.1   Hemoglobin 12.0 - 15.0 g/dL 9.1  10.2  11.7   Hematocrit 36.0 - 46.0 % 31.8  33.7  38.6   Platelets 150 - 400 K/uL 361  397  258        Latest Ref Rng & Units 10/16/2021   12:44 PM 04/06/2021    2:48 AM 02/08/2021   12:00 PM  CMP  Glucose 70 - 99 mg/dL 80  107  96   BUN 8 - 27 mg/dL '12  7  10   '$ Creatinine 0.57 - 1.00 mg/dL 0.66  0.63  0.77    Sodium 134 - 144 mmol/L 138  135  139   Potassium 3.5 - 5.2 mmol/L 4.5  3.7  4.3   Chloride 96 - 106 mmol/L 98  99  97   CO2 20 - 29 mmol/L '24  29  24   '$ Calcium 8.7 - 10.3 mg/dL 9.3  8.6  9.4   Total Protein 6.0 - 8.5 g/dL   7.5   Total Bilirubin 0.0 - 1.2 mg/dL   <0.2   Alkaline Phos 44 - 121 IU/L   88   AST 0 - 40 IU/L   20   ALT 0 - 32 IU/L   18     Lipid Panel     Component Value Date/Time   CHOL 183 10/16/2021 1244   TRIG 151 (H) 10/16/2021 1244   HDL 66 10/16/2021 1244   CHOLHDL 2.8 10/16/2021 1244   LDLCALC 91 10/16/2021 1244   LABVLDL 26 10/16/2021 1244    No components found for: "NTPROBNP" No results for input(s): "PROBNP" in the last 8760 hours. No results for input(s): "TSH" in the last 8760 hours.  BMP Recent Labs    02/08/21 1200 04/06/21 0248 10/16/21 1244  NA 139 135 138  K 4.3 3.7 4.5  CL 97 99 98  CO2 '24 29 24  '$ GLUCOSE 96 107* 80  BUN 10 7* 12  CREATININE 0.77 0.63 0.66  CALCIUM 9.4 8.6* 9.3  GFRNONAA  --  >60  --     HEMOGLOBIN A1C Lab Results  Component Value Date   HGBA1C 5.9 (H) 10/16/2021     IMPRESSION:    ICD-10-CM   1. Cardiomyopathy, unspecified type ([place])  I42.9 EKG 12-Lead    propranolol (INDERAL) 10 MG tablet    PCV ECHOCARDIOGRAM COMPLETE    2. OSA (obstructive sleep apnea)  G47.33     3. Benign hypertension  I10     4. Hx of syncope  Z87.898  5. Pulmonary nodule  R91.1        RECOMMENDATIONS: Ashley Lindsey is a 61 y.o. female whose past medical history and cardiac risk factors include: Cardiomyopathy, history of COVID-19 infection, TIA, HTN, OSA (not on CPAP yet dx in June 2022), fibromyalgia, history of breast cancer bilaterally status post chemotherapy and radiation, former smoker, obesity due to excess calories.  Cardiomyopathy, unspecified type ([place]) Mildly reduced LVEF likely secondary to prior history of chemotherapy/radiation for breast cancer. Stage B, NYHA class II. Echo: 99%, grade 1 diastolic  impairment, mild LAE, mild MR/TR. MPI: Low risk. Currently on Entresto and spironolactone. I have asked her to stop taking hydrochlorothiazide and propranolol which she takes on as needed basis. If she still does not wish to resume metoprolol and therefore the shared decision was to restart propranolol daily at a lower dose.  We will send a prescription for propranolol 10 mg p.o. twice daily. Repeat echocardiogram prior to the next office visit.  OSA (obstructive sleep apnea) Remains compliant with CPAP machine.  Benign hypertension Office blood pressures are very well controlled. Asymptomatic. Should improve as she is recommended to no longer take hydrochlorothiazide on as needed basis. We have importance of a low-salt diet and fluid restrictions  Hx of syncope Index event in May 2022 likely due to vasovagal episode. No reoccurrence of symptoms. Monitor for now.  Pulmonary nodule CT PE protocol in the past there is a 2 mm nodule in the left lower lobe.  Given her history of smoking I have recommended her to follow-up with pulmonary medicine.  FINAL MEDICATION LIST END OF ENCOUNTER: Meds ordered this encounter  Medications   propranolol (INDERAL) 10 MG tablet    Sig: Take 1 tablet (10 mg total) by mouth 2 (two) times daily. Hold if systolic blood pressure (top number) less than 100 mmHg or pulse less than 60 bpm.    Dispense:  60 tablet    Refill:  0     There are no discontinued medications.    Current Outpatient Medications:    acetaminophen (TYLENOL) 650 MG CR tablet, Take 650 mg by mouth every 8 (eight) hours as needed for pain., Disp: , Rfl:    albuterol (VENTOLIN HFA) 108 (90 Base) MCG/ACT inhaler, INHALE 1-2 PUFFS INTO THE LUNGS EVERY 6 (SIX) HOURS AS NEEDED FOR WHEEZING OR SHORTNESS OF BREATH., Disp: 6.7 each, Rfl: 1   aspirin-acetaminophen-caffeine (EXCEDRIN MIGRAINE) 250-250-65 MG tablet, Take 1 tablet by mouth every 6 (six) hours as needed for headache., Disp: , Rfl:     Bisacodyl (DULCOLAX PO), Take 1 capsule by mouth daily as needed (constipation). , Disp: , Rfl:    cholecalciferol 5000 units TABS, Take 1 tablet (5,000 Units total) by mouth 2 (two) times daily. (Patient taking differently: Take 5,000 Units by mouth daily.), Disp: , Rfl:    cyclobenzaprine (FLEXERIL) 10 MG tablet, TAKE 1 TABLET BY MOUTH EVERY DAY AS NEEDED, Disp: 90 tablet, Rfl: 2   dexlansoprazole (DEXILANT) 60 MG capsule, TAKE 1 CAPSULE BY MOUTH EVERY DAY, Disp: 90 capsule, Rfl: 1   diclofenac Sodium (VOLTAREN) 1 % GEL, Apply 2 g topically 4 (four) times daily., Disp: 2 g, Rfl: 3   diphenhydrAMINE (BENADRYL) 25 MG tablet, Take 1 tablet (25 mg total) by mouth every 6 (six) hours as needed for up to 20 doses (migraine). Take with reglan for migraine headache, Disp: 20 tablet, Rfl: 0   ELDERBERRY PO, Take 1 tablet by mouth daily., Disp: , Rfl:  ENTRESTO 24-26 MG, TAKE 1 TABLET BY MOUTH TWICE A DAY, Disp: 180 tablet, Rfl: 0   fish oil-omega-3 fatty acids 1000 MG capsule, Take 1 g by mouth daily., Disp: , Rfl:    Flaxseed, Linseed, (FLAXSEED OIL MAX STR PO), Take 1 capsule by mouth daily. , Disp: , Rfl:    gabapentin (NEURONTIN) 300 MG capsule, TAKE 1 CAPSULE BY MOUTH THREE TIMES A DAY, Disp: 90 capsule, Rfl: 3   Iron-FA-B Cmp-C-Biot-Probiotic (FUSION PLUS) CAPS, Take 1 capsule by mouth daily., Disp: 30 capsule, Rfl: 3   meloxicam (MOBIC) 7.5 MG tablet, TAKE 1 TABLET BY MOUTH EVERY DAY, Disp: 90 tablet, Rfl: 0   Multiple Vitamins-Minerals (MULTIVITAMIN & MINERAL PO), Take 1 tablet by mouth daily., Disp: , Rfl:    mupirocin ointment (BACTROBAN) 2 %, Apply 1 Application topically 2 (two) times daily., Disp: 22 g, Rfl: 0   omeprazole (PRILOSEC) 20 MG capsule, Take 20 mg by mouth daily., Disp: , Rfl:    ondansetron (ZOFRAN) 4 MG tablet, TAKE 1 TABLET BY MOUTH DAILY AS NEEDED FOR NAUSEA OR VOMITING, Disp: 30 tablet, Rfl: 1   OVER THE COUNTER MEDICATION, Take 125 mg by mouth daily.  [place]/Simethicone, Disp: , Rfl:    propranolol (INDERAL) 10 MG tablet, Take 1 tablet (10 mg total) by mouth 2 (two) times daily. Hold if systolic blood pressure (top number) less than 100 mmHg or pulse less than 60 bpm., Disp: 60 tablet, Rfl: 0   spironolactone (ALDACTONE) 25 MG tablet, TAKE 1 TABLET BY MOUTH EVERY DAY IN THE MORNING, Disp: 90 tablet, Rfl: 0   SYMBICORT 80-4.5 MCG/ACT inhaler, INHALE 2 PUFFS INTO THE LUNGS IN THE MORNING AND AT BEDTIME., Disp: 30.6 each, Rfl: 1   topiramate (TOPAMAX) 25 MG tablet, TAKE 1 TABLET (25 MG TOTAL) BY MOUTH DAILY., Disp: 90 tablet, Rfl: 1   triamcinolone cream (KENALOG) 0.1 %, Apply 1 Application topically 2 (two) times daily as needed (rash)., Disp: 80 g, Rfl: 1   Turmeric 500 MG CAPS, Take by mouth., Disp: , Rfl:    venlafaxine XR (EFFEXOR-XR) 150 MG 24 hr capsule, TAKE 1 CAPSULE BY MOUTH NIGHTLY WITH 75 MG CAPSULE (TOTAL OF 225 MG NIGHTLY), Disp: 90 capsule, Rfl: 0   venlafaxine XR (EFFEXOR-XR) 75 MG 24 hr capsule, Total dose 225 mg, Disp: 90 capsule, Rfl: 3   vitamin E 200 UNIT capsule, Take 200 Units by mouth daily., Disp: , Rfl:   Orders Placed This Encounter  Procedures   EKG 12-Lead   PCV ECHOCARDIOGRAM COMPLETE    There are no Patient Instructions on file for this visit.   --Continue cardiac medications as reconciled in final medication list. --Return in about 6 months (around 06/16/2022) for Follow up cardiomyopathy. . Or sooner if needed. --Continue follow-up with your primary care physician regarding the management of your other chronic comorbid conditions.  Patient's questions and concerns were addressed to her satisfaction. She voices understanding of the instructions provided during this encounter.   This note was created using a voice recognition software as a result there may be grammatical errors inadvertently enclosed that do not reflect the nature of this encounter. Every attempt is made to correct such errors.  Rex Kras,  [place], [facility]  Pager: [phone] Office: [phone]

## 2021-12-19 ENCOUNTER — Encounter: Payer: Self-pay | Admitting: Nurse Practitioner

## 2021-12-19 ENCOUNTER — Other Ambulatory Visit: Payer: Self-pay | Admitting: Nurse Practitioner

## 2021-12-19 ENCOUNTER — Ambulatory Visit (INDEPENDENT_AMBULATORY_CARE_PROVIDER_SITE_OTHER): Payer: Medicare Other | Admitting: Nurse Practitioner

## 2021-12-19 VITALS — BP 130/80 | HR 83 | Temp 98.2°F | Ht 62.0 in | Wt 267.2 lb

## 2021-12-19 DIAGNOSIS — R21 Rash and other nonspecific skin eruption: Secondary | ICD-10-CM | POA: Diagnosis not present

## 2021-12-19 DIAGNOSIS — M797 Fibromyalgia: Secondary | ICD-10-CM

## 2021-12-19 NOTE — Patient Instructions (Signed)

## 2021-12-19 NOTE — Progress Notes (Signed)
[name],acting as a Education administrator for [name], FNP.,have documented all relevant documentation on the behalf of [name], FNP,as directed by  [name], FNP while in the presence of [name], [name].    Subjective:     Patient ID: Ashley Lindsey , female    DOB: 05-Oct-1960 , 61 y.o.   MRN: [id number]   Chief Complaint  Patient presents with  . Referral    HPI  Patient presents today needing a referral to the dermatologist. She has chicken pots like spots she states it is itchy. She feels like she was bit by an insect and now has marks.   She went to urgent care on [facility], has been going on for one month. She went to urgent care at the beginning of the month given steroid injection and oral prednisone. She is using benadryl cream and hydrocortisone.   BP Readings from Last 3 Encounters: 12/19/21 : 130/80 12/14/21 : 101/74 11/29/21 : 135/83        Past Medical History:  Diagnosis Date  . Abdominal cramps   . Arthritis   . Cancer [facility])    s/p radiation- last dose 6/12//has been off Tamoxifen 8/12  . Chronic headaches   . Costochondritis   . Fatigue   . Fibromyalgia 03/06/2013  . History of COVID-19   . Hypertension   . Lymphedema    RIGHT ARM-////  STATES USE LEFT ARM FOR BP'S  . Migraines   . Muscle spasms of head and/or neck    following to the breast  . Passed out    4th of july  . Personal history of radiation therapy 2010   lt breast   . S/P radiation therapy 08/19/07 - 10/10/07   Left Breast/5040 cGy/28 fractions with Boost for a toatl dose of 6300 dGy  . S/P radiation therapy 08/14/10 -10/03/10   right breast  . Sleep apnea    STOP BANG SCORE 5     Family History  Problem Relation Age of Onset  . Breast cancer Mother 48  . Heart disease Father   . Cancer Maternal Aunt 63       d. from "female cancer" possibly cervical  . Breast cancer Maternal Aunt 60  . Prostate cancer Maternal Uncle 78  . Prostate cancer Maternal Uncle 80  .  Prostate cancer Paternal Uncle   . Prostate cancer Paternal Uncle   . Prostate cancer Maternal Grandfather 16  . Breast cancer Cousin 46       mat 1st cousin     Current Outpatient Medications:  .  acetaminophen (TYLENOL) 650 MG CR tablet, Take 650 mg by mouth every 8 (eight) hours as needed for pain., Disp: , Rfl:  .  albuterol (VENTOLIN HFA) 108 (90 Base) MCG/ACT inhaler, INHALE 1-2 PUFFS INTO THE LUNGS EVERY 6 (SIX) HOURS AS NEEDED FOR WHEEZING OR SHORTNESS OF BREATH., Disp: 6.7 each, Rfl: 1 .  aspirin-acetaminophen-caffeine (EXCEDRIN MIGRAINE) 250-250-65 MG tablet, Take 1 tablet by mouth every 6 (six) hours as needed for headache., Disp: , Rfl:  .  Bisacodyl (DULCOLAX PO), Take 1 capsule by mouth daily as needed (constipation). , Disp: , Rfl:  .  cholecalciferol 5000 units TABS, Take 1 tablet (5,000 Units total) by mouth 2 (two) times daily. (Patient taking differently: Take 5,000 Units by mouth daily.), Disp: , Rfl:  .  cyclobenzaprine (FLEXERIL) 10 MG tablet, TAKE 1 TABLET BY MOUTH EVERY DAY AS NEEDED, Disp: 90 tablet, Rfl: 2 .  dexlansoprazole (  DEXILANT) 60 MG capsule, TAKE 1 CAPSULE BY MOUTH EVERY DAY, Disp: 90 capsule, Rfl: 1 .  diclofenac Sodium (VOLTAREN) 1 % GEL, Apply 2 g topically 4 (four) times daily., Disp: 2 g, Rfl: 3 .  diphenhydrAMINE (BENADRYL) 25 MG tablet, Take 1 tablet (25 mg total) by mouth every 6 (six) hours as needed for up to 20 doses (migraine). Take with reglan for migraine headache, Disp: 20 tablet, Rfl: 0 .  ELDERBERRY PO, Take 1 tablet by mouth daily., Disp: , Rfl:  .  ENTRESTO 24-26 MG, TAKE 1 TABLET BY MOUTH TWICE A DAY, Disp: 180 tablet, Rfl: 0 .  fish oil-omega-3 fatty acids 1000 MG capsule, Take 1 g by mouth daily., Disp: , Rfl:  .  Flaxseed, Linseed, (FLAXSEED OIL MAX STR PO), Take 1 capsule by mouth daily. , Disp: , Rfl:  .  gabapentin (NEURONTIN) 300 MG capsule, TAKE 1 CAPSULE BY MOUTH THREE TIMES A DAY, Disp: 90 capsule, Rfl: 3 .  Iron-FA-B  Cmp-C-Biot-Probiotic (FUSION PLUS) CAPS, Take 1 capsule by mouth daily., Disp: 30 capsule, Rfl: 3 .  meloxicam (MOBIC) 7.5 MG tablet, TAKE 1 TABLET BY MOUTH EVERY DAY, Disp: 90 tablet, Rfl: 0 .  Multiple Vitamins-Minerals (MULTIVITAMIN & MINERAL PO), Take 1 tablet by mouth daily., Disp: , Rfl:  .  mupirocin ointment (BACTROBAN) 2 %, Apply 1 Application topically 2 (two) times daily., Disp: 22 g, Rfl: 0 .  omeprazole (PRILOSEC) 20 MG capsule, Take 20 mg by mouth daily., Disp: , Rfl:  .  ondansetron (ZOFRAN) 4 MG tablet, TAKE 1 TABLET BY MOUTH DAILY AS NEEDED FOR NAUSEA OR VOMITING, Disp: 30 tablet, Rfl: 1 .  OVER THE COUNTER MEDICATION, Take 125 mg by mouth daily. [place]/Simethicone, Disp: , Rfl:  .  propranolol (INDERAL) 10 MG tablet, Take 1 tablet (10 mg total) by mouth 2 (two) times daily. Hold if systolic blood pressure (top number) less than 100 mmHg or pulse less than 60 bpm., Disp: 60 tablet, Rfl: 0 .  spironolactone (ALDACTONE) 25 MG tablet, TAKE 1 TABLET BY MOUTH EVERY DAY IN THE MORNING, Disp: 90 tablet, Rfl: 0 .  SYMBICORT 80-4.5 MCG/ACT inhaler, INHALE 2 PUFFS INTO THE LUNGS IN THE MORNING AND AT BEDTIME., Disp: 30.6 each, Rfl: 1 .  topiramate (TOPAMAX) 25 MG tablet, TAKE 1 TABLET (25 MG TOTAL) BY MOUTH DAILY., Disp: 90 tablet, Rfl: 1 .  triamcinolone cream (KENALOG) 0.1 %, Apply 1 Application topically 2 (two) times daily as needed (rash)., Disp: 80 g, Rfl: 1 .  Turmeric 500 MG CAPS, Take by mouth., Disp: , Rfl:  .  venlafaxine XR (EFFEXOR-XR) 150 MG 24 hr capsule, TAKE 1 CAPSULE BY MOUTH NIGHTLY WITH 75 MG CAPSULE (TOTAL OF 225 MG NIGHTLY), Disp: 90 capsule, Rfl: 0 .  venlafaxine XR (EFFEXOR-XR) 75 MG 24 hr capsule, Total dose 225 mg, Disp: 90 capsule, Rfl: 3 .  vitamin E 200 UNIT capsule, Take 200 Units by mouth daily., Disp: , Rfl:    Allergies  Allergen Reactions  . Aspirin     unknown  . Penicillins Hives and Swelling    Has patient had a PCN reaction causing immediate rash,  facial/tongue/throat swelling, SOB or lightheadedness with hypotension: Yes Has patient had a PCN reaction causing severe rash involving mucus membranes or skin necrosis: Yes Has patient had a PCN reaction that required hospitalization Yes Has patient had a PCN reaction occurring within the last 10 years: No If all of the above answers are "NO", then may  proceed with Cephalosporin use.      Review of Systems  Constitutional: Negative.   HENT: Negative.    Eyes: Negative.   Respiratory: Negative.    Cardiovascular: Negative.   Gastrointestinal: Negative.   Skin:  Positive for rash (upper arms with papular areas and lower extremities with dry raised rash.).     Today's Vitals   12/19/21 1040  BP: 130/80  Pulse: 83  Temp: 98.2 F (36.8 C)  TempSrc: Oral  Weight: 267 lb 3.2 oz (121.2 kg)  Height: '5\' 2"'$  (1.575 m)  PainSc: 0-No pain   Body mass index is 48.87 kg/m.  Wt Readings from Last 3 Encounters:  12/19/21 267 lb 3.2 oz (121.2 kg)  12/14/21 265 lb 6.4 oz (120.4 kg)  10/30/21 270 lb 9.6 oz (122.7 kg)    Objective:  Physical Exam Vitals reviewed.  Constitutional:      General: She is not in acute distress.    Appearance: Normal appearance. She is obese.  Cardiovascular:     Rate and Rhythm: Normal rate and regular rhythm.     Pulses: Normal pulses.     Heart sounds: Normal heart sounds. No murmur heard. Pulmonary:     Effort: Pulmonary effort is normal. No respiratory distress.     Breath sounds: Normal breath sounds. No wheezing.  Skin:    General: Skin is warm.     Findings: Rash (has scattered vesicles to posterior right and left upper arms with some scarring noted to outer areas) present.     Comments: Dry skin from knee down to ankles.   Neurological:     General: No focal deficit present.     Mental Status: She is alert and oriented to person, place, and time.     Cranial Nerves: No cranial nerve deficit.     Motor: No weakness.  Psychiatric:        Mood  and Affect: Mood normal.        Behavior: Behavior normal.        Thought Content: Thought content normal.        Judgment: Judgment normal.        Assessment And Plan:     1. Rash and nonspecific skin eruption - Ambulatory referral to Dermatology     Patient was given opportunity to ask questions. Patient verbalized understanding of the plan and was able to repeat key elements of the plan. All questions were answered to their satisfaction.  [name], FNP   I, [name], FNP, have reviewed all documentation for this visit. The documentation on 12/19/21 for the exam, diagnosis, procedures, and orders are all accurate and complete.   IF YOU HAVE BEEN REFERRED TO A SPECIALIST, IT MAY TAKE 1-2 WEEKS TO SCHEDULE/PROCESS THE REFERRAL. IF YOU HAVE NOT HEARD FROM US/SPECIALIST IN TWO WEEKS, PLEASE GIVE [place] A CALL AT [phone].   THE PATIENT IS ENCOURAGED TO PRACTICE SOCIAL DISTANCING DUE TO THE COVID-19 PANDEMIC.

## 2021-12-20 MED ORDER — MELOXICAM 7.5 MG PO TABS: 7.5000 mg | ORAL_TABLET | Freq: Every day | ORAL | 0 refills | Status: DC

## 2021-12-27 ENCOUNTER — Other Ambulatory Visit: Payer: Self-pay | Admitting: Nurse Practitioner

## 2021-12-27 ENCOUNTER — Encounter: Payer: Self-pay | Admitting: Nurse Practitioner

## 2021-12-28 ENCOUNTER — Encounter: Payer: Self-pay | Admitting: Nurse Practitioner

## 2022-01-01 ENCOUNTER — Ambulatory Visit
Admission: EM | Admit: 2022-01-01 | Discharge: 2022-01-01 | Disposition: A | Discharge status: Home / Self Care | Payer: Medicare Other | Attending: Physician Assistant | Admitting: Physician Assistant

## 2022-01-01 ENCOUNTER — Other Ambulatory Visit: Payer: Medicare Other

## 2022-01-01 ENCOUNTER — Inpatient Hospital Stay: Admission: RE | Admit: 2022-01-01 | Payer: Medicare Other | Source: Ambulatory Visit

## 2022-01-01 DIAGNOSIS — Z1152 Encounter for screening for COVID-19: Secondary | ICD-10-CM

## 2022-01-01 DIAGNOSIS — J069 Acute upper respiratory infection, unspecified: Secondary | ICD-10-CM | POA: Diagnosis not present

## 2022-01-01 LAB — RESP PANEL BY RT-PCR (FLU A&B, COVID) ARPGX2
Influenza A by PCR: NEGATIVE
Influenza B by PCR: NEGATIVE
SARS Coronavirus 2 by RT PCR: POSITIVE — AB

## 2022-01-01 NOTE — ED Triage Notes (Signed)
Pt c/o cough, SOB, malaise, "sneezing attacks," fever at home   Onset ~ Thursday

## 2022-01-01 NOTE — ED Provider Notes (Signed)
[facility]    CSN: [id number] Arrival date & time: 01/01/22  1634      History   Chief Complaint Chief Complaint  Patient presents with   sneeze attacks    HPI Ashley Lindsey is a 61 y.o. female.   Patient here today for evaluation of sneezing, cough, shortness of breath and fatigue that started about 4-5 days ago. She has had fever at home. She has taken 2 covid tests that were expired that gave positive error as results. She has not had nausea or vomiting but has had some diarrhea. She has taken OTC meds without resolution.   The history is provided by the patient.    Past Medical History:  Diagnosis Date   Abdominal cramps    Arthritis    Cancer ([place])    s/p radiation- last dose 6/12//has been off Tamoxifen 8/12   Chronic headaches    Costochondritis    Fatigue    Fibromyalgia 03/06/2013   History of COVID-19    Hypertension    Lymphedema    RIGHT ARM-////  STATES USE LEFT ARM FOR BP'S   Migraines    Muscle spasms of head and/or neck    following to the breast   Passed out    4th of july   Personal history of radiation therapy 2010   lt breast    S/P radiation therapy 08/19/07 - 10/10/07   Left Breast/5040 cGy/28 fractions with Boost for a toatl dose of 6300 dGy   S/P radiation therapy 08/14/10 -10/03/10   right breast   Sleep apnea    STOP BANG SCORE 5    Patient Active Problem List   Diagnosis Date Noted   Arthritis of left knee 10/02/2021   Pulmonary nodules 07/12/2021   Chronic cough 07/12/2021   Avulsion fracture of middle phalanx of finger 04/18/2021   Tibial plateau fracture, left 04/18/2021   Asymmetrical sensorineural hearing loss 02/02/2021   Bilateral primary osteoarthritis of knee 02/09/2020   Chronic intermittent hypoxia with obstructive sleep apnea 08/24/2019   Chronic intractable headache 07/14/2019   Morning headache 07/14/2019   Loud snoring 07/14/2019   Super obesity 07/14/2019   Sleeps in sitting position due to  orthopnea 07/14/2019   Chest pain due to GERD 07/14/2019   Nocturia more than twice per night 07/14/2019   Unilateral primary osteoarthritis, right knee 05/26/2019   Chronic migraine without aura without status migrainosus, not intractable 05/13/2019   Class 3 severe obesity due to excess calories without serious comorbidity with body mass index (BMI) of 40.0 to 44.9 in adult ([place]) 08/05/2018   Depression 08/05/2018   Chronic nonintractable headache 08/05/2018   Elevated blood-pressure reading without diagnosis of hypertension 06/06/2018   Chronic pain of right ankle 04/24/2018   Breast cancer of lower-outer quadrant of right female breast ([place]) 12/02/2014   Bronchospasm 09/21/2014   Dyspnea 09/21/2014   Chronic pain of right knee 09/21/2014   Fibromyalgia 03/06/2013   Dense breasts 03/06/2013   Mastalgia 03/06/2013   Back pain, lumbosacral 03/06/2013   Lymphedema of arm 03/06/2013   Atypical chest pain 01/06/2013   Breast cancer of upper-inner quadrant of left female breast ([place]) 09/08/2012   Family history of malignant neoplasm of breast 09/08/2012   S/P radiation therapy    History of breast cancer in female 08/13/2011    Past Surgical History:  Procedure Laterality Date   ABDOMINAL HYSTERECTOMY     with left salpingooophorectomy   BREAST LUMPECTOMY  2009/2012  LEFT/RIGHT with lymph node dissection   KNEE ARTHROSCOPY     right   OOPHORECTOMY  2013    OB History   No obstetric history on file.      Home Medications    Prior to Admission medications   Medication Sig Start Date End Date Taking? Authorizing Provider  acetaminophen (TYLENOL) 650 MG CR tablet Take 650 mg by mouth every 8 (eight) hours as needed for pain.    [provider]  albuterol (VENTOLIN HFA) 108 (90 Base) MCG/ACT inhaler INHALE 1-2 PUFFS INTO THE LUNGS EVERY 6 (SIX) HOURS AS NEEDED FOR WHEEZING OR SHORTNESS OF BREATH. 05/03/20   Nicholas Lose, MD  aspirin-acetaminophen-caffeine (EXCEDRIN  MIGRAINE) [phone] MG tablet Take 1 tablet by mouth every 6 (six) hours as needed for headache.    [provider]  Bisacodyl (DULCOLAX PO) Take 1 capsule by mouth daily as needed (constipation).     [provider]  cholecalciferol 5000 units TABS Take 1 tablet (5,000 Units total) by mouth 2 (two) times daily. Patient taking differently: Take 5,000 Units by mouth daily. 07/21/15   Nicholas Lose, MD  cyclobenzaprine (FLEXERIL) 10 MG tablet TAKE 1 TABLET BY MOUTH EVERY DAY AS NEEDED 08/01/21   [name], FNP  dexlansoprazole (DEXILANT) 60 MG capsule TAKE 1 CAPSULE BY MOUTH EVERY DAY 07/10/21   [name], FNP  diclofenac Sodium (VOLTAREN) 1 % GEL Apply 2 g topically 4 (four) times daily. 07/06/21   Raulkar, Clide Deutscher, MD  diphenhydrAMINE (BENADRYL) 25 MG tablet Take 1 tablet (25 mg total) by mouth every 6 (six) hours as needed for up to 20 doses (migraine). Take with reglan for migraine headache 09/11/20   Wyvonnia Dusky, MD  ELDERBERRY PO Take 1 tablet by mouth daily.    [provider]  ENTRESTO 24-26 MG TAKE 1 TABLET BY MOUTH TWICE A DAY 12/06/21   Tolia, Sunit, DO  fish oil-omega-3 fatty acids 1000 MG capsule Take 1 g by mouth daily.    [provider]  Flaxseed, Linseed, (FLAXSEED OIL MAX STR PO) Take 1 capsule by mouth daily.     [provider]  gabapentin (NEURONTIN) 300 MG capsule TAKE 1 CAPSULE BY MOUTH THREE TIMES A DAY 06/27/21   Raulkar, Clide Deutscher, MD  Iron-FA-B Cmp-C-Biot-Probiotic (FUSION PLUS) CAPS Take 1 capsule by mouth daily. 06/23/18   [name], FNP  meloxicam (MOBIC) 7.5 MG tablet Take 1 tablet (7.5 mg total) by mouth daily. 12/20/21   [name], FNP  Multiple Vitamins-Minerals (MULTIVITAMIN & MINERAL PO) Take 1 tablet by mouth daily.    [provider]  mupirocin ointment (BACTROBAN) 2 % Apply 1 Application topically 2 (two) times daily. 11/29/21   Teodora Medici, FNP  omeprazole (PRILOSEC) 20 MG capsule Take 20 mg by  mouth daily.    [provider]  ondansetron (ZOFRAN) 4 MG tablet TAKE 1 TABLET BY MOUTH DAILY AS NEEDED FOR NAUSEA OR VOMITING 12/29/21   [name], FNP  OVER THE COUNTER MEDICATION Take 125 mg by mouth daily. [place]/Simethicone    [provider]  propranolol (INDERAL) 10 MG tablet Take 1 tablet (10 mg total) by mouth 2 (two) times daily. Hold if systolic blood pressure (top number) less than 100 mmHg or pulse less than 60 bpm. 12/14/21 01/13/22  Tolia, Sunit, DO  spironolactone (ALDACTONE) 25 MG tablet TAKE 1 TABLET BY MOUTH EVERY DAY IN THE MORNING 12/06/21   Tolia, Sunit, DO  SYMBICORT 80-4.5 MCG/ACT inhaler INHALE  2 PUFFS INTO THE LUNGS IN THE MORNING AND AT BEDTIME. 04/21/21   [name], FNP  topiramate (TOPAMAX) 25 MG tablet TAKE 1 TABLET (25 MG TOTAL) BY MOUTH DAILY. 10/03/21   Raulkar, Clide Deutscher, MD  triamcinolone cream (KENALOG) 0.1 % Apply 1 Application topically 2 (two) times daily as needed (rash). 10/16/21   [name], FNP  Turmeric 500 MG CAPS Take by mouth.    [provider]  venlafaxine XR (EFFEXOR-XR) 150 MG 24 hr capsule TAKE 1 CAPSULE BY MOUTH NIGHTLY WITH 75 MG CAPSULE (TOTAL OF 225 MG NIGHTLY) 10/30/21   Nicholas Lose, MD  venlafaxine XR (EFFEXOR-XR) 75 MG 24 hr capsule Total dose 225 mg 10/30/21   Nicholas Lose, MD  vitamin E 200 UNIT capsule Take 200 Units by mouth daily.    [provider]    Family History Family History  Problem Relation Age of Onset   Breast cancer Mother 45   Heart disease Father    Cancer Maternal Aunt 25       d. from "female cancer" possibly cervical   Breast cancer Maternal Aunt 60   Prostate cancer Maternal Uncle 78   Prostate cancer Maternal Uncle 80   Prostate cancer Paternal Uncle    Prostate cancer Paternal Uncle    Prostate cancer Maternal Grandfather 40   Breast cancer Cousin 78       mat 1st cousin    Social History Social History   Tobacco Use   Smoking status: Former    Packs/day:  0.25    Years: 1.00    Total pack years: 0.25    Types: Cigarettes   Smokeless tobacco: Never  Vaping Use   Vaping Use: Never used  Substance Use Topics   Alcohol use: Not Currently   Drug use: No     Allergies   Aspirin and Penicillins   Review of Systems Review of Systems  Constitutional:  Positive for fatigue and fever.  HENT:  Positive for congestion and sore throat. Negative for ear pain.   Eyes:  Negative for discharge and redness.  Respiratory:  Positive for cough and shortness of breath. Negative for wheezing.   Gastrointestinal:  Negative for abdominal pain, diarrhea, nausea and vomiting.     Physical Exam Triage Vital Signs ED Triage Vitals  Enc Vitals Group     BP      Pulse      Resp      Temp      Temp src      SpO2      Weight      Height      Head Circumference      Peak Flow      Pain Score      Pain Loc      Pain Edu?      Excl. in [place]?    No data found.  Updated Vital Signs BP 129/80 (BP Location: Left Arm)   Pulse 83   Temp 98.2 F (36.8 C) (Oral)   Resp 20   SpO2 95%      Physical Exam Vitals and nursing note reviewed.  Constitutional:      General: She is not in acute distress.    Appearance: Normal appearance. She is not ill-appearing.  HENT:     Head: Normocephalic and atraumatic.     Right Ear: Tympanic membrane normal.     Left Ear: Tympanic membrane normal.     Nose: Congestion present.  Mouth/Throat:     Mouth: Mucous membranes are moist.     Pharynx: No oropharyngeal exudate or posterior oropharyngeal erythema.  Eyes:     Conjunctiva/sclera: Conjunctivae normal.  Cardiovascular:     Rate and Rhythm: Normal rate and regular rhythm.     Heart sounds: Normal heart sounds. No murmur heard. Pulmonary:     Effort: Pulmonary effort is normal. No respiratory distress.     Breath sounds: Normal breath sounds. No wheezing, rhonchi or rales.  Skin:    General: Skin is warm and dry.  Neurological:     Mental Status:  She is alert.  Psychiatric:        Mood and Affect: Mood normal.        Thought Content: Thought content normal.      UC Treatments / Results  Labs (all labs ordered are listed, but only abnormal results are displayed) Labs Reviewed  RESP PANEL BY RT-PCR (FLU A&B, COVID) [id number]    EKG   Radiology No results found.  Procedures Procedures (including critical care time)  Medications Ordered in UC Medications - No data to display  Initial Impression / Assessment and Plan / UC Course  I have reviewed the triage vital signs and the nursing notes.  Pertinent labs & imaging results that were available during my care of the patient were reviewed by me and considered in my medical decision making (see chart for details).    Will screen for covid and flu. Will await results for further recommendation. Encouraged follow up with any further concerns.   Final Clinical Impressions(s) / UC Diagnoses   Final diagnoses:  Acute upper respiratory infection  Encounter for screening for COVID-19   Discharge Instructions   None    ED Prescriptions   None    PDMP not reviewed this encounter.   Francene Finders, PA-C 01/01/22 1824

## 2022-01-02 ENCOUNTER — Encounter: Payer: Self-pay | Admitting: Nurse Practitioner

## 2022-01-02 ENCOUNTER — Telehealth (INDEPENDENT_AMBULATORY_CARE_PROVIDER_SITE_OTHER): Payer: Medicare Other | Admitting: Nurse Practitioner

## 2022-01-02 VITALS — Temp 101.2°F

## 2022-01-02 DIAGNOSIS — U071 COVID-19: Secondary | ICD-10-CM

## 2022-01-02 DIAGNOSIS — R051 Acute cough: Secondary | ICD-10-CM

## 2022-01-02 MED ORDER — AZITHROMYCIN 250 MG PO TABS: ORAL_TABLET | ORAL | 0 refills | Status: AC

## 2022-01-02 MED ORDER — MOLNUPIRAVIR 200 MG PO CAPS: 4.0000 | ORAL_CAPSULE | Freq: Two times a day (BID) | ORAL | 0 refills | Status: AC

## 2022-01-02 MED ORDER — BENZONATATE 100 MG PO CAPS: 100.0000 mg | ORAL_CAPSULE | Freq: Four times a day (QID) | ORAL | 1 refills | Status: DC | PRN

## 2022-01-02 MED ORDER — MOMETASONE FUROATE 50 MCG/ACT NA SUSP: 2.0000 | Freq: Every day | NASAL | 2 refills | Status: DC

## 2022-01-02 NOTE — Progress Notes (Signed)
Virtual Visit via MyChart   This visit type was conducted due to national recommendations for restrictions regarding the COVID-19 Pandemic (e.g. social distancing) in an effort to limit this patient's exposure and mitigate transmission in our community.  Due to her co-morbid illnesses, this patient is at least at moderate risk for complications without adequate follow up.  This format is felt to be most appropriate for this patient at this time.  All issues noted in this document were discussed and addressed.  A limited physical exam was performed with this format.    This visit type was conducted due to national recommendations for restrictions regarding the COVID-19 Pandemic (e.g. social distancing) in an effort to limit this patient's exposure and mitigate transmission in our community.  Patients identity confirmed using two different identifiers.  This format is felt to be most appropriate for this patient at this time.  All issues noted in this document were discussed and addressed.  No physical exam was performed (except for noted visual exam findings with Video Visits).    Date:  01/17/2022   ID:  Ashley Lindsey, DOB 10-13-1960, MRN [id number]  Patient Location:  Home - spoke with Ashley Lindsey  Provider location:   Office    Chief Complaint:  positive for covid after visit to Urgent  History of Present Illness:    Ashley Lindsey is a 62 y.o. female who presents via video conferencing for a telehealth visit today.    The patient does have symptoms concerning for COVID-19 infection (fever, chills, cough, or new shortness of breath).   Tested positive for covid - she took 2 covid tests that said error. [facility], sneezing, coughing, runny nose and fever (up to 101.9).  The fever has improved.  Was up last night coughing - deep penetrating cough is causing her pain. She has been taking Nyquil.      Past Medical History:  Diagnosis Date   Abdominal cramps     Arthritis    Cancer ([place])    s/p radiation- last dose 6/12//has been off Tamoxifen 8/12   Chronic headaches    Costochondritis    Fatigue    Fibromyalgia 03/06/2013   History of COVID-19    Hypertension    Lymphedema    RIGHT ARM-////  STATES USE LEFT ARM FOR BP'S   Migraines    Muscle spasms of head and/or neck    following to the breast   Passed out    4th of july   Personal history of radiation therapy 2010   lt breast    S/P radiation therapy 08/19/07 - 10/10/07   Left Breast/5040 cGy/28 fractions with Boost for a toatl dose of 6300 dGy   S/P radiation therapy 08/14/10 -10/03/10   right breast   Sleep apnea    STOP BANG SCORE 5   Past Surgical History:  Procedure Laterality Date   ABDOMINAL HYSTERECTOMY     with left salpingooophorectomy   BREAST LUMPECTOMY  2009/2012   LEFT/RIGHT with lymph node dissection   KNEE ARTHROSCOPY     right   OOPHORECTOMY  2013     Current Meds  Medication Sig   [EXPIRED] azithromycin (ZITHROMAX) 250 MG tablet Take 2 tablets (500 mg) on  Day 1,  followed by 1 tablet (250 mg) once daily on Days 2 through 5.   benzonatate (TESSALON PERLES) 100 MG capsule Take 1 capsule (100 mg total) by mouth every 6 (six) hours as needed.   [  EXPIRED] molnupiravir EUA (LAGEVRIO) 200 MG CAPS capsule Take 4 capsules (800 mg total) by mouth 2 (two) times daily for 5 days.   mometasone (NASONEX) 50 MCG/ACT nasal spray Place 2 sprays into the nose daily.     Allergies:   Aspirin and Penicillins   Social History   Tobacco Use   Smoking status: Former    Packs/day: 0.25    Years: 1.00    Total pack years: 0.25    Types: Cigarettes   Smokeless tobacco: Never  Vaping Use   Vaping Use: Never used  Substance Use Topics   Alcohol use: Not Currently   Drug use: No     Family Hx: The patient's family history includes Breast cancer (age of onset: 40) in her cousin; Breast cancer (age of onset: 49) in her maternal aunt; Breast cancer (age of onset: 66) in her  mother; Cancer (age of onset: 87) in her maternal aunt; Heart disease in her father; Prostate cancer in her paternal uncle and paternal uncle; Prostate cancer (age of onset: 41) in her maternal uncle; Prostate cancer (age of onset: 60) in her maternal uncle; Prostate cancer (age of onset: 29) in her maternal grandfather.  ROS:   Please see the history of present illness.    Review of Systems  Constitutional:  Positive for fever and malaise/fatigue.  HENT:  Positive for congestion.   Respiratory:  Positive for cough. Negative for shortness of breath.   Cardiovascular:  Positive for chest pain (when coughing).  Musculoskeletal:  Positive for myalgias.  Neurological:  Positive for headaches. Negative for dizziness.    All other systems reviewed and are negative.   Labs/Other Tests and Data Reviewed:    Recent Labs: 02/08/2021: ALT 18 04/06/2021: Hemoglobin 9.1; Platelets 361 10/16/2021: BUN 12; Creatinine, Ser 0.66; Potassium 4.5; Sodium 138 01/16/2022: Magnesium 1.9   Recent Lipid Panel Lab Results  Component Value Date/Time   CHOL 183 10/16/2021 12:44 PM   TRIG 151 (H) 10/16/2021 12:44 PM   HDL 66 10/16/2021 12:44 PM   CHOLHDL 2.8 10/16/2021 12:44 PM   LDLCALC 91 10/16/2021 12:44 PM    Wt Readings from Last 3 Encounters:  01/16/22 270 lb (122.5 kg)  12/19/21 267 lb 3.2 oz (121.2 kg)  12/14/21 265 lb 6.4 oz (120.4 kg)     Exam:    Vital Signs:  Temp (!) 101.2 F (38.4 C) (Oral)     Physical Exam Vitals reviewed.  Constitutional:      General: She is not in acute distress.    Appearance: Normal appearance.  Pulmonary:     Effort: Pulmonary effort is normal. No respiratory distress.  Neurological:     General: No focal deficit present.     Mental Status: She is alert and oriented to person, place, and time.     Cranial Nerves: No cranial nerve deficit.     Motor: No weakness.  Psychiatric:        Mood and Affect: Mood normal.        Behavior: Behavior normal.         Thought Content: Thought content normal.        Judgment: Judgment normal.     ASSESSMENT & PLAN:    1. Positive self-administered antigen test for COVID-19 Advised patient to take Vitamin C, D, Zinc.  Keep yourself hydrated with a lot of water and rest. Take Delsym for cough and Mucinex as need. Take Tylenol or pain reliever every 4-6 hours as needed for  pain/fever/body ache. If you have elevated blood pressure, you can take OTC Corcidin. You can also take OTC oscillococcinum to help with your symptoms.  Educated patient if symptoms get worse or if she experiences any SOB, chest pain or pain in her legs to seek immediate emergency care. Continue to monitor your oxygen levels. Call us if you have any questions. Quarantine for 5 days if tested positive and no symptoms or 10 days if tested positive and have symptoms. Wear a mask around other people.  - mometasone (NASONEX) 50 MCG/ACT nasal spray; Place 2 sprays into the nose daily.  Dispense: 1 each; Refill: 2 - molnupiravir EUA (LAGEVRIO) 200 MG CAPS capsule; Take 4 capsules (800 mg total) by mouth 2 (two) times daily for 5 days.  Dispense: 40 capsule; Refill: 0  2. Acute cough  - benzonatate (TESSALON PERLES) 100 MG capsule; Take 1 capsule (100 mg total) by mouth every 6 (six) hours as needed.  Dispense: 30 capsule; Refill: 1 - azithromycin (ZITHROMAX) 250 MG tablet; Take 2 tablets (500 mg) on  Day 1,  followed by 1 tablet (250 mg) once daily on Days 2 through 5.  Dispense: 6 each; Refill: 0   COVID-19 Education: The signs and symptoms of COVID-19 were discussed with the patient and how to seek care for testing (follow up with PCP or arrange E-visit).  The importance of social distancing was discussed today.  Patient Risk:   After full review of this patients clinical status, I feel that they are at least moderate risk at this time.  Time:   Today, I have spent 12 minutes/ seconds with the patient with telehealth technology discussing  above diagnoses.     Medication Adjustments/Labs and Tests Ordered: Current medicines are reviewed at length with the patient today.  Concerns regarding medicines are outlined above.   Tests Ordered: No orders of the defined types were placed in this encounter.   Medication Changes: Meds ordered this encounter  Medications   mometasone (NASONEX) 50 MCG/ACT nasal spray    Sig: Place 2 sprays into the nose daily.    Dispense:  1 each    Refill:  2   benzonatate (TESSALON PERLES) 100 MG capsule    Sig: Take 1 capsule (100 mg total) by mouth every 6 (six) hours as needed.    Dispense:  30 capsule    Refill:  1   molnupiravir EUA (LAGEVRIO) 200 MG CAPS capsule    Sig: Take 4 capsules (800 mg total) by mouth 2 (two) times daily for 5 days.    Dispense:  40 capsule    Refill:  0   azithromycin (ZITHROMAX) 250 MG tablet    Sig: Take 2 tablets (500 mg) on  Day 1,  followed by 1 tablet (250 mg) once daily on Days 2 through 5.    Dispense:  6 each    Refill:  0    Disposition:  Follow up prn  Signed, [name], FNP

## 2022-01-02 NOTE — Patient Instructions (Signed)
    Person Under Monitoring Name: Ashley Lindsey  Location: [place] [address]   CORONAVIRUS DISEASE 2019 (COVID-19) Guidance for Persons Under Investigation You are being tested for the virus that causes coronavirus disease 2019 (COVID-19). Public health actions are necessary to ensure protection of your health and the health of others, and to prevent further spread of infection. COVID-19 is caused by a virus that can cause symptoms, such as fever, cough, and shortness of breath. The primary transmission from person to person is by coughing or sneezing. On May 22, 2018, the [place] announced a [employer] Emergency of International Concern and on May 23, 2018 the U.S. Department of Health and Human Services declared a public health emergency. If the virus that causesCOVID-19 spreads in the community, it could have severe public health consequences.  As a person under investigation for COVID-19, the [facility] advises you to adhere to the following guidance until your test results are reported to you. If your test result is positive, you will receive additional information from your provider and your local health department at that time.  Remain at home until you are cleared by your health provider or public health authorities.  Keep a log of visitors to your home using the form provided. Any visitors to your home must be aware of your isolation status. If you plan to move to a new address or leave the county, notify the local health department in your county. Call a doctor or seek care if you have an urgent medical need. Before seeking medical care, call ahead and get instructions from the provider before arriving at the medical office, clinic or hospital. Notify them that you are being tested for the virus that causes COVID-19 so arrangements can be made, as necessary, to  prevent transmission to others in the healthcare setting. Next, notify the local health department in your county. If a medical emergency arises and you need to call 911, inform the first responders that you are being tested for the virus that causes COVID-19. Next, notify the local health department in your county. Adhere to all guidance set forth by the [place] for [facility] of patients that is based on guidance from the Center for Disease Control and Prevention with suspected or confirmed COVID-19. It is provided with this guidance for Persons Under Investigation.  Your health and the health of our community are our top priorities. Public Health officials remain available to provide assistance and counseling to you about COVID-19 and compliance with this guidance.  Provider: ____________________________________________________________ Date: ______/_____/_________  By signing below, you acknowledge that you have read and agree to comply with this Guidance for Persons Under Investigation. ______________________________________________________________ Date: ______/_____/_________  WHO DO I CALL? You can find a list of local health departments here: [email or web address] Health Department: ____________________________________________________________________ Contact Name: ________________________________________________________________________ Telephone: ___________________________________________________________________________  Marice Potter, [place], Communicable Disease Branch COVID-19 Guidance for Persons Under Investigation June 28, 2018

## 2022-01-03 ENCOUNTER — Encounter: Payer: Self-pay | Admitting: Physical Medicine and Rehabilitation

## 2022-01-03 ENCOUNTER — Other Ambulatory Visit: Payer: Self-pay | Admitting: Nurse Practitioner

## 2022-01-03 DIAGNOSIS — R110 Nausea: Secondary | ICD-10-CM

## 2022-01-04 ENCOUNTER — Other Ambulatory Visit: Payer: Self-pay

## 2022-01-04 MED ORDER — TOPIRAMATE 25 MG PO TABS: 25.0000 mg | ORAL_TABLET | Freq: Every day | ORAL | 1 refills | Status: DC

## 2022-01-04 NOTE — Telephone Encounter (Signed)
Pharmacy is sending a request for Topiramate 25 mg

## 2022-01-05 ENCOUNTER — Encounter: Payer: Self-pay | Admitting: Nurse Practitioner

## 2022-01-05 ENCOUNTER — Encounter: Payer: Self-pay | Admitting: Orthopaedic Surgery

## 2022-01-09 ENCOUNTER — Other Ambulatory Visit: Payer: Self-pay | Admitting: Cardiology

## 2022-01-09 DIAGNOSIS — I429 Cardiomyopathy, unspecified: Secondary | ICD-10-CM

## 2022-01-10 NOTE — Telephone Encounter (Signed)
Have her bring the forms to the office-need to re-evaluate knee problems prior to signing forms

## 2022-01-15 ENCOUNTER — Ambulatory Visit: Payer: Medicare Other

## 2022-01-15 ENCOUNTER — Other Ambulatory Visit: Payer: Medicare Other

## 2022-01-15 ENCOUNTER — Ambulatory Visit
Admission: RE | Admit: 2022-01-15 | Discharge: 2022-01-15 | Disposition: A | Discharge status: Home / Self Care | Payer: Medicare Other | Source: Ambulatory Visit | Attending: Nurse Practitioner | Admitting: Nurse Practitioner

## 2022-01-15 DIAGNOSIS — N644 Mastodynia: Secondary | ICD-10-CM | POA: Diagnosis not present

## 2022-01-16 ENCOUNTER — Encounter: Payer: Self-pay | Admitting: Physical Medicine and Rehabilitation

## 2022-01-16 ENCOUNTER — Encounter
Payer: Medicare Other | Attending: Physical Medicine and Rehabilitation | Admitting: Physical Medicine and Rehabilitation

## 2022-01-16 VITALS — BP 108/75 | HR 83 | Ht 62.0 in | Wt 270.0 lb

## 2022-01-16 DIAGNOSIS — I890 Lymphedema, not elsewhere classified: Secondary | ICD-10-CM | POA: Diagnosis not present

## 2022-01-16 DIAGNOSIS — M797 Fibromyalgia: Secondary | ICD-10-CM | POA: Insufficient documentation

## 2022-01-16 DIAGNOSIS — M1711 Unilateral primary osteoarthritis, right knee: Secondary | ICD-10-CM | POA: Insufficient documentation

## 2022-01-16 DIAGNOSIS — E46 Unspecified protein-calorie malnutrition: Secondary | ICD-10-CM | POA: Diagnosis not present

## 2022-01-16 MED ORDER — TOPIRAMATE 25 MG PO TABS: 25.0000 mg | ORAL_TABLET | Freq: Two times a day (BID) | ORAL | 3 refills | Status: DC

## 2022-01-16 NOTE — Patient Instructions (Addendum)
Foods that may reduce pain: 1) Ginger (especially studied for arthritis)- reduce leukotriene production to decrease inflammation 2) Blueberries- high in phytonutrients that decrease inflammation 3) Salmon- marine omega-3s reduce joint swelling and pain 4) Pumpkin seeds- reduce inflammation 5) dark chocolate- reduces inflammation 6) turmeric- reduces inflammation 7) tart cherries - reduce pain and stiffness 8) extra virgin olive oil - its compound olecanthal helps to block prostaglandins  9) chili peppers- can be eaten or applied topically via capsaicin 10) mint- helpful for headache, muscle aches, joint pain, and itching 11) garlic- reduces inflammation  Link to further information on diet for chronic pain: [email or web address]    Infrared lamp [place]  Insomnia: -Try to go outside near sunrise -Get exercise during the day.  -Turn off all devices an hour before bedtime.  -Teas that can benefit: chamomile, valerian root, Brahmi (Bacopa) -Can consider over the counter melatonin, magnesium, and/or L-theanine. Melatonin is an anti-oxidant with multiple health benefits. Magnesium is involved in greater than 300 enzymatic reactions in the body and most of [place] are deficient as our soil is often depleted. There are 7 different types of magnesium- Bioptemizer's is a supplement with all 7 types, and each has unique benefits. Magnesium can also help with constipation and anxiety.  -Pistachios naturally increase the production of melatonin -Cozy Earth bamboo bed sheets are free from toxic chemicals.  -Tart cherry juice or a tart cherry supplement can improve sleep and soreness post-workout   HTN: -BP is 108/75 today.  -Advised regarding healthy foods that can help lower blood pressure and provided with a list: 1) citrus foods- high in vitamins and minerals 2) salmon and other fatty fish - reduces inflammation and oxylipins 3)  swiss chard (leafy green)- high level of nitrates 4) pumpkin seeds- one of the best natural sources of magnesium 5) Beans and lentils- high in fiber, magnesium, and potassium 6) Berries- high in flavonoids 7) Amaranth (whole grain, can be cooked similarly to rice and oats)- high in magnesium and fiber 8) Pistachios- even more effective at reducing BP than other nuts 9) Carrots- high in phenolic compounds that relax blood vessels and reduce inflammation 10) Celery- contain phthalides that relax tissues of arterial walls 11) Tomatoes- can also improve cholesterol and reduce risk of heart disease 12) Broccoli- good source of magnesium, calcium, and potassium 13) Greek yogurt: high in potassium and calcium 14) Herbs and spices: Celery seed, cilantro, saffron, lemongrass, black cumin, ginseng, cinnamon, cardamom, sweet basil, and ginger 15) Chia and flax seeds- also help to lower cholesterol and blood sugar 16) Beets- high levels of nitrates that relax blood vessels  17) spinach and bananas- high in potassium  -Provided lise of supplements that can help with hypertension:  1) magnesium: one high quality brand is Bioptemizers since it contains all 7 types of magnesium, otherwise over the counter magnesium gluconate '400mg'$  is a good option 2) B vitamins 3) vitamin D 4) potassium 5) CoQ10 6) L-arginine 7) Vitamin C 8) Beetroot -Educated that goal BP is 120/80. -Made goal to incorporate some of the above foods into diet.    Foods that have been show to reduce anxiety: 1) [place] nuts, mushrooms, soy beans due to their high selenium content. Upper limit of toxicity of selenium is 457mg/day so no more than 3-4 bBolivianuts per day.  2) Fatty fish such as salmon, mackerel, sardines, trout, and herring- high in omega-3 fatty acids 3) Eggs- increases serotonin and dopamine 4) Pumpkin seeds- high in omega-3 fatty acids 5) dark chocolate-  high in flavanols that increase blood flow to brain 6)  turmeric- take with black pepper to increase absorption 7) chamomile tea- antioxidant and anti-inflammatory properties 8) yogurt without sugar- supports gut-brain axis 9) green tea- contains L- theanine 10) blueberries- high in vitamin C and antioxidants 11) [place]- high in tryptophan which gets converted to serotonin 12) bell peppers- rich in vitamin C and antioxidants 13) citrus fruits- rich in vitamin C and antioxidants 14) almonds- high in vitamin E and healthy fats 15) chia seeds- high in omega-3 fatty acids
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
## 2022-01-17 LAB — MAGNESIUM: Magnesium: 1.9 mg/dL (ref 1.6–2.3)

## 2022-01-17 LAB — VITAMIN D 25 HYDROXY (VIT D DEFICIENCY, FRACTURES): Vit D, 25-Hydroxy: 41.6 ng/mL (ref 30.0–100.0)

## 2022-01-18 ENCOUNTER — Encounter: Payer: Self-pay | Admitting: Physical Medicine and Rehabilitation

## 2022-01-18 ENCOUNTER — Encounter (HOSPITAL_BASED_OUTPATIENT_CLINIC_OR_DEPARTMENT_OTHER): Payer: Medicare Other | Admitting: Physical Medicine and Rehabilitation

## 2022-01-18 DIAGNOSIS — E46 Unspecified protein-calorie malnutrition: Secondary | ICD-10-CM

## 2022-01-19 MED ORDER — VITAMIN D (ERGOCALCIFEROL) 1.25 MG (50000 UNIT) PO CAPS: 50000.0000 [IU] | ORAL_CAPSULE | ORAL | 0 refills | Status: DC
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
## 2022-01-20 ENCOUNTER — Telehealth: Payer: Self-pay

## 2022-01-20 NOTE — Telephone Encounter (Signed)
Called patient to complete awv today. She declined, wanting to keep original appt.

## 2022-01-22 ENCOUNTER — Telehealth: Payer: Self-pay | Admitting: Gastroenterology

## 2022-01-22 NOTE — Telephone Encounter (Signed)
Good afternoon Dr. Loletha Carrow,   We have received a referral for patient to have a colonoscopy as well as records from her last procedure. Patient is requesting to become a patient of yours. Patient had her last office visit with Dr. Collene Mares in 2016 and also had a colonoscopy in 2017 with Dr. Collene Mares as well. I will be sending records for you to review, will you please review and advise on scheduling?  Thank you.

## 2022-01-25 ENCOUNTER — Encounter: Payer: Self-pay | Admitting: Gastroenterology

## 2022-01-25 NOTE — Telephone Encounter (Signed)
Ashley Lindsey,   This patient had a screening colonoscopy with Dr. Collene Mares in January 2017.  It was a complete exam with good preparation and no polyps.  Dr. Collene Mares recommended a 10-year recall.  So this patient is not due for screening colonoscopy until approximately January 2027.  - HD  Ashley Lindsey on your primary care patient

## 2022-01-25 NOTE — Telephone Encounter (Signed)
Thank you for the update, we will f/u with the patient to find out why she is trying to transfer care and make her aware she is up to date on her colonoscopy.   Kindest Regards,   [name], DNP, FNP-BC

## 2022-01-25 NOTE — Telephone Encounter (Signed)
[name],    You sent [place] a referral for [name] screening in June (? Perhaps not realizing she had a prior colonoscopy with Dr. Collene Mares?).    The report we have from Dr Collene Mares will be scanned into the chart soon.  Wilfrid Lund, [facility]

## 2022-01-29 NOTE — Telephone Encounter (Signed)
Okay thank you, I noticed this afterwards. My apologies.  Kindest Regards,   [name], DNP, FNP-BC

## 2022-01-30 ENCOUNTER — Encounter: Payer: Self-pay | Admitting: Orthopaedic Surgery

## 2022-02-01 ENCOUNTER — Ambulatory Visit (INDEPENDENT_AMBULATORY_CARE_PROVIDER_SITE_OTHER): Payer: Medicare Other | Admitting: Orthopaedic Surgery

## 2022-02-01 ENCOUNTER — Encounter: Payer: Self-pay | Admitting: Orthopaedic Surgery

## 2022-02-01 DIAGNOSIS — M1712 Unilateral primary osteoarthritis, left knee: Secondary | ICD-10-CM

## 2022-02-01 DIAGNOSIS — M170 Bilateral primary osteoarthritis of knee: Secondary | ICD-10-CM

## 2022-02-01 MED ORDER — LIDOCAINE HCL 1 % IJ SOLN
2.0000 mL | INTRAMUSCULAR | Status: AC | PRN
  Administered 2022-02-01: 2 mL

## 2022-02-01 MED ORDER — METHYLPREDNISOLONE ACETATE 40 MG/ML IJ SUSP
80.0000 mg | INTRAMUSCULAR | Status: AC | PRN
  Administered 2022-02-01: 80 mg via INTRA_ARTICULAR

## 2022-02-01 MED ORDER — BUPIVACAINE HCL 0.25 % IJ SOLN
2.0000 mL | INTRAMUSCULAR | Status: AC | PRN
  Administered 2022-02-01: 2 mL via INTRA_ARTICULAR

## 2022-02-01 NOTE — Progress Notes (Signed)
Office Visit Note   Patient: Ashley Lindsey           Date of Birth: 02-27-1961           MRN: [id number] Visit Date: 02/01/2022              Requested by: Ashley Lindsey, [address] [place],  [place] [address] PCP: Ashley Brine, FNP   Assessment & Plan: Visit Diagnoses:  1. Bilateral primary osteoarthritis of knee     Plan: Ashley Lindsey is a pleasant 61 year old woman with end-stage tricompartmental varus arthritis.  She is working on getting her BMI down.  She comes in today for an injection into her left knee.  Denies any new injury.  She would like to come in 1 final time to see Dr. Gerhard Lindsey in a month to review neck steps.  She understands and knows that he is retiring but would like to have her knees looked at 1 more time.  She will follow-up in a month.  Given the severity of her varus I did elect to inject her laterally to ensure good entrance into the joint  Follow-Up Instructions: Return in about 1 month (around 03/04/2022).   Orders:  No orders of the defined types were placed in this encounter.  No orders of the defined types were placed in this encounter.     Procedures: Large Joint Inj: L knee on 02/01/2022 3:53 PM Indications: pain and diagnostic evaluation Details: 25 G 1.5 in needle, anterolateral approach  Arthrogram: No  Medications: 80 mg methylPREDNISolone acetate 40 MG/ML; 2 mL lidocaine 1 %; 2 mL bupivacaine 0.25 % Outcome: tolerated well, no immediate complications  After obtaining verbal consent the anterior lateral knee was prepped with alcohol followed by Betadine.  80 cc of Depo-Medrol 2 cc of bupivacaine and 2 cc of lidocaine were injected.  Patient tolerated the procedure well Procedure, treatment alternatives, risks and benefits explained, specific risks discussed. Consent was given by the patient. Immediately prior to procedure a time out was called to verify the correct patient, procedure, equipment, support staff and site/side  marked as required. Patient was prepped and draped in the usual sterile fashion.     Clinical Data: No additional findings.   Subjective: Chief Complaint  Patient presents with   Left Knee - Pain  Patient presents today for left knee pain. She is wanting a cortisone injection.     Review of Systems   Objective: Vital Signs: There were no vitals taken for this visit.  Physical Exam Constitutional:      Appearance: Normal appearance.  Pulmonary:     Effort: Pulmonary effort is normal.  Neurological:     Mental Status: She is alert.     Ortho Exam Examination of her left knee no effusion no warmth no erythema.  Compartments of the lower leg are soft nontender.  She does have a significant clinical varus alignment Specialty Comments:  No specialty comments available.  Imaging: No results found.   PMFS History: Patient Active Problem List   Diagnosis Date Noted   Arthritis of left knee 10/02/2021   Pulmonary nodules 07/12/2021   Chronic cough 07/12/2021   Avulsion fracture of middle phalanx of finger 04/18/2021   Tibial plateau fracture, left 04/18/2021   Asymmetrical sensorineural hearing loss 02/02/2021   Bilateral primary osteoarthritis of knee 02/09/2020   Chronic intermittent hypoxia with obstructive sleep apnea 08/24/2019   Chronic intractable headache 07/14/2019   Morning headache 07/14/2019   Loud  snoring 07/14/2019   Super obesity 07/14/2019   Sleeps in sitting position due to orthopnea 07/14/2019   Chest pain due to GERD 07/14/2019   Nocturia more than twice per night 07/14/2019   Unilateral primary osteoarthritis, right knee 05/26/2019   Chronic migraine without aura without status migrainosus, not intractable 05/13/2019   Class 3 severe obesity due to excess calories without serious comorbidity with body mass index (BMI) of 40.0 to 44.9 in adult ([place]) 08/05/2018   Depression 08/05/2018   Chronic nonintractable headache 08/05/2018   Elevated  blood-pressure reading without diagnosis of hypertension 06/06/2018   Chronic pain of right ankle 04/24/2018   Breast cancer of lower-outer quadrant of right female breast ([place]) 12/02/2014   Bronchospasm 09/21/2014   Dyspnea 09/21/2014   Chronic pain of right knee 09/21/2014   Fibromyalgia 03/06/2013   Dense breasts 03/06/2013   Mastalgia 03/06/2013   Back pain, lumbosacral 03/06/2013   Lymphedema of arm 03/06/2013   Atypical chest pain 01/06/2013   Breast cancer of upper-inner quadrant of left female breast ([place]) 09/08/2012   Family history of malignant neoplasm of breast 09/08/2012   S/P radiation therapy    History of breast cancer in female 08/13/2011   Past Medical History:  Diagnosis Date   Abdominal cramps    Arthritis    Cancer ([place])    s/p radiation- last dose 6/12//has been off Tamoxifen 8/12   Chronic headaches    Costochondritis    Fatigue    Fibromyalgia 03/06/2013   History of COVID-19    Hypertension    Lymphedema    RIGHT ARM-////  STATES USE LEFT ARM FOR BP'S   Migraines    Muscle spasms of head and/or neck    following to the breast   Passed out    4th of july   Personal history of radiation therapy 2010   lt breast    S/P radiation therapy 08/19/07 - 10/10/07   Left Breast/5040 cGy/28 fractions with Boost for a toatl dose of 6300 dGy   S/P radiation therapy 08/14/10 -10/03/10   right breast   Sleep apnea    STOP BANG SCORE 5    Family History  Problem Relation Age of Onset   Breast cancer Mother 6   Heart disease Father    Cancer Maternal Aunt 63       d. from "female cancer" possibly cervical   Breast cancer Maternal Aunt 60   Prostate cancer Maternal Uncle 78   Prostate cancer Maternal Uncle 80   Prostate cancer Paternal Uncle    Prostate cancer Paternal Uncle    Prostate cancer Maternal Grandfather 2   Breast cancer Cousin 69       mat 1st cousin    Past Surgical History:  Procedure Laterality Date   ABDOMINAL HYSTERECTOMY     with  left salpingooophorectomy   BREAST LUMPECTOMY  2009/2012   LEFT/RIGHT with lymph node dissection   KNEE ARTHROSCOPY     right   OOPHORECTOMY  2013   Social History   Occupational History   Occupation: disability  Tobacco Use   Smoking status: Former    Packs/day: 0.25    Years: 1.00    Total pack years: 0.25    Types: Cigarettes   Smokeless tobacco: Never  Vaping Use   Vaping Use: Never used  Substance and Sexual Activity   Alcohol use: Not Currently   Drug use: No   Sexual activity: Not Currently    Birth control/protection: Surgical

## 2022-02-06 ENCOUNTER — Other Ambulatory Visit: Payer: Self-pay | Admitting: Cardiology

## 2022-02-06 DIAGNOSIS — I429 Cardiomyopathy, unspecified: Secondary | ICD-10-CM

## 2022-02-08 ENCOUNTER — Ambulatory Visit: Payer: Medicare Other | Admitting: Nurse Practitioner

## 2022-02-08 ENCOUNTER — Ambulatory Visit (INDEPENDENT_AMBULATORY_CARE_PROVIDER_SITE_OTHER): Payer: Medicare Other

## 2022-02-08 VITALS — BP 110/60 | HR 99 | Temp 98.3°F | Ht 60.0 in | Wt 264.0 lb

## 2022-02-08 DIAGNOSIS — Z0000 Encounter for general adult medical examination without abnormal findings: Secondary | ICD-10-CM | POA: Diagnosis not present

## 2022-02-08 DIAGNOSIS — Z23 Encounter for immunization: Secondary | ICD-10-CM

## 2022-02-08 NOTE — Progress Notes (Signed)
Subjective:   Ashley Lindsey is a 61 y.o. female who presents for Medicare Annual (Subsequent) preventive examination.  Review of Systems     Cardiac Risk Factors include: obesity (BMI >30kg/m2)     Objective:    Today's Vitals   02/08/22 0956 02/08/22 1003  BP: 110/60   Pulse: 99   Temp: 98.3 F (36.8 C)   TempSrc: Oral   SpO2: 95%   Weight: 264 lb (119.7 kg)   Height: 5' (1.524 m)   PainSc:  8    Body mass index is 51.56 kg/m.     02/08/2022   10:10 AM 10/30/2021   11:52 AM 07/06/2021   10:18 AM 05/31/2021    9:46 AM 04/05/2021    6:26 PM 01/11/2021    2:05 PM 09/11/2020    2:33 PM  Advanced Directives  Does Patient Have a Medical Advance Directive? No No No No No Yes No  Type of Teacher, early years/pre;Living will   Copy of [place] in Chart?      No - copy requested   Would patient like information on creating a medical advance directive? No - Patient declined No - Patient declined  No - Patient declined   No - Patient declined    Current Medications (verified) Outpatient Encounter Medications as of 02/08/2022  Medication Sig   acetaminophen (TYLENOL) 650 MG CR tablet Take 650 mg by mouth every 8 (eight) hours as needed for pain.   albuterol (VENTOLIN HFA) 108 (90 Base) MCG/ACT inhaler INHALE 1-2 PUFFS INTO THE LUNGS EVERY 6 (SIX) HOURS AS NEEDED FOR WHEEZING OR SHORTNESS OF BREATH.   aspirin-acetaminophen-caffeine (EXCEDRIN MIGRAINE) 250-250-65 MG tablet Take 1 tablet by mouth every 6 (six) hours as needed for headache.   benzonatate (TESSALON PERLES) 100 MG capsule Take 1 capsule (100 mg total) by mouth every 6 (six) hours as needed.   Bisacodyl (DULCOLAX PO) Take 1 capsule by mouth daily as needed (constipation).    cholecalciferol 5000 units TABS Take 1 tablet (5,000 Units total) by mouth 2 (two) times daily. (Patient taking differently: Take 5,000 Units by mouth daily.)   cyclobenzaprine (FLEXERIL) 10 MG tablet  TAKE 1 TABLET BY MOUTH EVERY DAY AS NEEDED   dexlansoprazole (DEXILANT) 60 MG capsule TAKE 1 CAPSULE BY MOUTH EVERY DAY   diclofenac Sodium (VOLTAREN) 1 % GEL Apply 2 g topically 4 (four) times daily.   diphenhydrAMINE (BENADRYL) 25 MG tablet Take 1 tablet (25 mg total) by mouth every 6 (six) hours as needed for up to 20 doses (migraine). Take with reglan for migraine headache   ELDERBERRY PO Take 1 tablet by mouth daily.   ENTRESTO 24-26 MG TAKE 1 TABLET BY MOUTH TWICE A DAY   fish oil-omega-3 fatty acids 1000 MG capsule Take 1 g by mouth daily.   Flaxseed, Linseed, (FLAXSEED OIL MAX STR PO) Take 1 capsule by mouth daily.    gabapentin (NEURONTIN) 300 MG capsule TAKE 1 CAPSULE BY MOUTH THREE TIMES A DAY   Iron-FA-B Cmp-C-Biot-Probiotic (FUSION PLUS) CAPS Take 1 capsule by mouth daily.   Magnesium 100 MG TABS Take 1 tablet by mouth daily. Takes 50 mg   meloxicam (MOBIC) 7.5 MG tablet Take 1 tablet (7.5 mg total) by mouth daily.   mometasone (NASONEX) 50 MCG/ACT nasal spray Place 2 sprays into the nose daily.   Multiple Vitamins-Minerals (MULTIVITAMIN & MINERAL PO) Take 1 tablet by mouth daily.  mupirocin ointment (BACTROBAN) 2 % Apply 1 Application topically 2 (two) times daily.   omeprazole (PRILOSEC) 20 MG capsule Take 20 mg by mouth daily.   ondansetron (ZOFRAN) 4 MG tablet TAKE 1 TABLET BY MOUTH DAILY AS NEEDED FOR NAUSEA OR VOMITING   OVER THE COUNTER MEDICATION Take 125 mg by mouth daily. [place]/Simethicone   propranolol (INDERAL) 10 MG tablet TAKE 1 TABLET BY MOUTH TWICE A DAY *HOLD IF SYSTOLIC BP (TOP #) IS <865HQIO OR PULSE IS <60BPM   spironolactone (ALDACTONE) 25 MG tablet TAKE 1 TABLET BY MOUTH EVERY DAY IN THE MORNING   SYMBICORT 80-4.5 MCG/ACT inhaler INHALE 2 PUFFS INTO THE LUNGS IN THE MORNING AND AT BEDTIME.   topiramate (TOPAMAX) 25 MG tablet Take 1 tablet (25 mg total) by mouth 2 (two) times daily.   triamcinolone cream (KENALOG) 0.1 % APPLY 1 APPLICATION TOPICALLY 2 (TWO)  TIMES DAILY AS NEEDED (RASH).   Turmeric 500 MG CAPS Take by mouth.   venlafaxine XR (EFFEXOR-XR) 150 MG 24 hr capsule TAKE 1 CAPSULE BY MOUTH NIGHTLY WITH 75 MG CAPSULE (TOTAL OF 225 MG NIGHTLY)   venlafaxine XR (EFFEXOR-XR) 75 MG 24 hr capsule Total dose 225 mg   Vitamin D, Ergocalciferol, (DRISDOL) 1.25 MG (50000 UNIT) CAPS capsule Take 1 capsule (50,000 Units total) by mouth every 7 (seven) days.   vitamin E 200 UNIT capsule Take 200 Units by mouth daily.   No facility-administered encounter medications on file as of 02/08/2022.    Allergies (verified) Aspirin and Penicillins   History: Past Medical History:  Diagnosis Date   Abdominal cramps    Arthritis    Cancer ([place])    s/p radiation- last dose 6/12//has been off Tamoxifen 8/12   Chronic headaches    Costochondritis    Fatigue    Fibromyalgia 03/06/2013   History of COVID-19    Hypertension    Lymphedema    RIGHT ARM-////  STATES USE LEFT ARM FOR BP'S   Migraines    Muscle spasms of head and/or neck    following to the breast   Passed out    4th of july   Personal history of radiation therapy 2010   lt breast    S/P radiation therapy 08/19/07 - 10/10/07   Left Breast/5040 cGy/28 fractions with Boost for a toatl dose of 6300 dGy   S/P radiation therapy 08/14/10 -10/03/10   right breast   Sleep apnea    STOP BANG SCORE 5   Past Surgical History:  Procedure Laterality Date   ABDOMINAL HYSTERECTOMY     with left salpingooophorectomy   BREAST LUMPECTOMY  2009/2012   LEFT/RIGHT with lymph node dissection   KNEE ARTHROSCOPY     right   OOPHORECTOMY  2013   Family History  Problem Relation Age of Onset   Breast cancer Mother 90   Heart disease Father    Cancer Maternal Aunt 63       d. from "female cancer" possibly cervical   Breast cancer Maternal Aunt 60   Prostate cancer Maternal Uncle 78   Prostate cancer Maternal Uncle 80   Prostate cancer Paternal Uncle    Prostate cancer Paternal Uncle    Prostate  cancer Maternal Grandfather 26   Breast cancer Cousin 41       mat 1st cousin   Social History   Socioeconomic History   Marital status: Divorced    Spouse name: Not on file   Number of children: Not on file  Years of education: Not on file   Highest education level: Not on file  Occupational History   Occupation: disability  Tobacco Use   Smoking status: Former    Packs/day: 0.25    Years: 1.00    Total pack years: 0.25    Types: Cigarettes   Smokeless tobacco: Never  Vaping Use   Vaping Use: Never used  Substance and Sexual Activity   Alcohol use: Not Currently   Drug use: No   Sexual activity: Not Currently    Birth control/protection: Surgical  Other Topics Concern   Not on file  Social History Narrative   Right handed    Coffee daily, sometimes Ginger ale    Social Determinants of Health   Financial Resource Strain: Low Risk  (02/08/2022)   Overall Financial Resource Strain (CARDIA)    Difficulty of Paying Living Expenses: Not hard at all  Food Insecurity: No Food Insecurity (02/08/2022)   Hunger Vital Sign    Worried About Running Out of Food in the Last Year: Never true    Ran Out of Food in the Last Year: Never true  Transportation Needs: No Transportation Needs (02/08/2022)   PRAPARE - Hydrologist (Medical): No    Lack of Transportation (Non-Medical): No  Physical Activity: Inactive (02/08/2022)   Exercise Vital Sign    Days of Exercise per Week: 0 days    Minutes of Exercise per Session: 0 min  Stress: No Stress Concern Present (02/08/2022)   [place]    Feeling of Stress : Not at all  Social Connections: Not on file    Tobacco Counseling Counseling given: Not Answered   Clinical Intake:  Pre-visit preparation completed: Yes  Pain : 0-10 Pain Score: 8  Pain Type: Chronic pain Pain Location: Knee Pain Orientation: Left Pain Descriptors /  Indicators: Aching Pain Onset: More than a month ago Pain Frequency: Constant     Nutritional Status: BMI > 30  Obese Nutritional Risks: None Diabetes: No  How often do you need to have someone help you when you read instructions, pamphlets, or other written materials from your doctor or pharmacy?: 1 - Never What is the last grade level you completed in school?: 12th grade  Diabetic? no  Interpreter Needed?: No  Information entered by :: NAllen LPN   Activities of Daily Living    02/08/2022   10:11 AM  In your present state of health, do you have any difficulty performing the following activities:  Hearing? 1  Comment has hearing aid  Vision? 0  Difficulty concentrating or making decisions? 1  Comment some trouble with memory  Walking or climbing stairs? 1  Dressing or bathing? 0  Doing errands, shopping? 0  Preparing Food and eating ? N  Using the Toilet? N  In the past six months, have you accidently leaked urine? Y  Do you have problems with loss of bowel control? N  Managing your Medications? N  Managing your Finances? N  Housekeeping or managing your Housekeeping? N    Patient Care Team: [name], FNP as PCP - General (General Practice)  Indicate any recent Medical Services you may have received from other than [facility] providers in the past year (date may be approximate).     Assessment:   This is a routine wellness examination for Semiyah.  Hearing/Vision screen Vision Screening - Comments:: Regular eye exams, [facility], Dr. Nicoletta Dress  Dietary issues and exercise  activities discussed: Current Exercise Habits: The patient does not participate in regular exercise at present   Goals Addressed             This Visit's Progress    Patient Stated       02/08/2022, wants to lose weight       Depression Screen    02/08/2022   10:11 AM 01/16/2022    9:14 AM 12/19/2021   10:39 AM 07/06/2021   10:17 AM 02/02/2021    9:45 AM 01/11/2021    2:05 PM 12/02/2019     2:55 PM  PHQ 2/9 Scores  PHQ - 2 Score 0 0 0 2 2 0 2  PHQ- 9 Score       10    Fall Risk    02/08/2022   10:11 AM 01/16/2022    9:14 AM 12/19/2021   10:39 AM 07/06/2021   10:17 AM 02/02/2021    9:44 AM  Fall Risk   Falls in the past year? 0 0 0 0 1  Comment     Last fall in Sept 2022. No major injury.  Number falls in past yr: 0  0  0  Injury with Fall? 0  0  0  Risk for fall due to : Medication side effect  Impaired mobility;Impaired balance/gait    Follow up Falls prevention discussed;Education provided;Falls evaluation completed  Falls evaluation completed      FALL RISK PREVENTION PERTAINING TO THE HOME:  Any stairs in or around the home? Yes  If so, are there any without handrails? No  Home free of loose throw rugs in walkways, pet beds, electrical cords, etc? Yes  Adequate lighting in your home to reduce risk of falls? Yes   ASSISTIVE DEVICES UTILIZED TO PREVENT FALLS:  Life alert? No  Use of a cane, walker or w/c? Yes  Grab bars in the bathroom? Yes  Shower chair or bench in shower? Yes  Elevated toilet seat or a handicapped toilet? No   TIMED UP AND GO:  Was the test performed? Yes .  Length of time to ambulate 10 feet: 6 sec.   Gait slow and steady without use of assistive device  Cognitive Function:        02/08/2022   10:13 AM 01/11/2021    2:07 PM 12/02/2019    2:59 PM 10/09/2018   11:51 AM  6CIT Screen  What Year? 0 points 0 points 0 points 0 points  What month? 0 points 0 points 0 points 0 points  What time? 0 points 0 points 0 points 0 points  Count back from 20 0 points 4 points 0 points 0 points  Months in reverse 2 points 0 points 0 points 2 points  Repeat phrase 2 points 10 points 4 points 0 points  Total Score 4 points 14 points 4 points 2 points    Immunizations Immunization History  Administered Date(s) Administered   Influenza Inj Mdck Quad Pf 05/20/2017   Influenza Split 04/21/2012   Influenza,inj,Quad PF,6+ Mos 05/19/2013,  05/16/2015, 01/26/2016, 02/27/2018, 01/20/2019, 02/03/2020, 02/08/2021, 02/08/2022   Influenza-Unspecified 02/24/2018   PFIZER(Purple Top)SARS-COV-2 Vaccination 07/23/2019, 08/17/2019, 04/04/2020   Pneumococcal Polysaccharide-23 02/08/2021   Tdap 11/05/2012   Zoster Recombinat (Shingrix) 10/16/2021    TDAP status: Up to date  Flu Vaccine status: Completed at today's visit  Pneumococcal vaccine status: Up to date  Covid-19 vaccine status: Completed vaccines  Qualifies for Shingles Vaccine? Yes   Zostavax completed No   Shingrix Completed?:  needs second dose  Screening Tests Health Maintenance  Topic Date Due   COVID-19 Vaccine (4 - Pfizer risk series) 05/30/2020   Zoster Vaccines- Shingrix (2 of 2) 12/11/2021   Diabetic kidney evaluation - Urine ACR  02/08/2022   Diabetic kidney evaluation - GFR measurement  10/17/2022   TETANUS/TDAP  11/06/2022   MAMMOGRAM  01/16/2024   COLONOSCOPY (Pts 45-33yr Insurance coverage will need to be confirmed)  05/22/2025   INFLUENZA VACCINE  Completed   Hepatitis C Screening  Completed   HIV Screening  Completed   HPV VACCINES  Aged Out   PAP SMEAR-Modifier  Discontinued    Health Maintenance  Health Maintenance Due  Topic Date Due   COVID-19 Vaccine (4 - Pfizer risk series) 05/30/2020   Zoster Vaccines- Shingrix (2 of 2) 12/11/2021   Diabetic kidney evaluation - Urine ACR  02/08/2022    Colorectal cancer screening: Type of screening: Colonoscopy. Completed 05/23/2015. Repeat every 10 years  Mammogram status: Completed 01/15/2022. Repeat every year  Bone Density status: n/a  Lung Cancer Screening: (Low Dose CT Chest recommended if Age 63-80years, 30 pack-year currently smoking OR have quit w/in 15years.) does not qualify.   Lung Cancer Screening Referral: no  Additional Screening:  Hepatitis C Screening: does qualify; Completed 10/09/2018  Vision Screening: Recommended annual ophthalmology exams for early detection of glaucoma  and other disorders of the eye. Is the patient up to date with their annual eye exam?  Yes  Who is the provider or what is the name of the office in which the patient attends annual eye exams? Dr. LNicoletta DressIf pt is not established with a provider, would they like to be referred to a provider to establish care? No .   Dental Screening: Recommended annual dental exams for proper oral hygiene  Community Resource Referral / Chronic Care Management: CRR required this visit?  No   CCM required this visit?  No      Plan:     I have personally reviewed and noted the following in the patient's chart:   Medical and social history Use of alcohol, tobacco or illicit drugs  Current medications and supplements including opioid prescriptions. Patient is not currently taking opioid prescriptions. Functional ability and status Nutritional status Physical activity Advanced directives List of other physicians Hospitalizations, surgeries, and ER visits in previous 12 months Vitals Screenings to include cognitive, depression, and falls Referrals and appointments  In addition, I have reviewed and discussed with patient certain preventive protocols, quality metrics, and best practice recommendations. A written personalized care plan for preventive services as well as general preventive health recommendations were provided to patient.     NKellie Simmering LPN   [id number]  Nurse Notes: none

## 2022-02-08 NOTE — Patient Instructions (Signed)
[name] , Thank you for taking time to come for your Medicare Wellness Visit. I appreciate your ongoing commitment to your health goals. Please review the following plan we discussed and let me know if I can assist you in the future.   Screening recommendations/referrals: Colonoscopy: completed 05/23/2015, due 05/22/2025 Mammogram: completed 01/15/2022, due 01/17/2023 Bone Density: n/a Recommended yearly ophthalmology/optometry visit for glaucoma screening and checkup Recommended yearly dental visit for hygiene and checkup  Vaccinations: Influenza vaccine: today Pneumococcal vaccine: n/a Tdap vaccine: completed 11/05/2012, 11/06/2022 Shingles vaccine: needs second dose can get on 11/13 appointment Covid-19:  04/04/2020, 08/17/2019, 07/23/2019  Advanced directives: Advance directive discussed with you today. Even though you declined this today please call our office should you change your mind and we can give you the proper paperwork for you to fill out.  Conditions/risks identified: none  Next appointment: Follow up in one year for your annual wellness visit.   Preventive Care 40-64 Years, Female Preventive care refers to lifestyle choices and visits with your health care provider that can promote health and wellness. What does preventive care include? A yearly physical exam. This is also called an annual well check. Dental exams once or twice a year. Routine eye exams. Ask your health care provider how often you should have your eyes checked. Personal lifestyle choices, including: Daily care of your teeth and gums. Regular physical activity. Eating a healthy diet. Avoiding tobacco and drug use. Limiting alcohol use. Practicing safe sex. Taking low-dose aspirin daily starting at age 83. Taking vitamin and mineral supplements as recommended by your health care provider. What happens during an annual well check? The services and screenings done by your health care provider during your  annual well check will depend on your age, overall health, lifestyle risk factors, and family history of disease. Counseling  Your health care provider may ask you questions about your: Alcohol use. Tobacco use. Drug use. Emotional well-being. Home and relationship well-being. Sexual activity. Eating habits. Work and work Statistician. Method of birth control. Menstrual cycle. Pregnancy history. Screening  You may have the following tests or measurements: Height, weight, and BMI. Blood pressure. Lipid and cholesterol levels. These may be checked every 5 years, or more frequently if you are over 47 years old. Skin check. Lung cancer screening. You may have this screening every year starting at age 85 if you have a 30-pack-year history of smoking and currently smoke or have quit within the past 15 years. Fecal occult blood test (FOBT) of the stool. You may have this test every year starting at age 88. Flexible sigmoidoscopy or colonoscopy. You may have a sigmoidoscopy every 5 years or a colonoscopy every 10 years starting at age 49. Hepatitis C blood test. Hepatitis B blood test. Sexually transmitted disease (STD) testing. Diabetes screening. This is done by checking your blood sugar (glucose) after you have not eaten for a while (fasting). You may have this done every 1-3 years. Mammogram. This may be done every 1-2 years. Talk to your health care provider about when you should start having regular mammograms. This may depend on whether you have a family history of breast cancer. BRCA-related cancer screening. This may be done if you have a family history of breast, ovarian, tubal, or peritoneal cancers. Pelvic exam and Pap test. This may be done every 3 years starting at age 4. Starting at age 59, this may be done every 5 years if you have a Pap test in combination with an HPV test.  Bone density scan. This is done to screen for osteoporosis. You may have this scan if you are at high  risk for osteoporosis. Discuss your test results, treatment options, and if necessary, the need for more tests with your health care provider. Vaccines  Your health care provider may recommend certain vaccines, such as: Influenza vaccine. This is recommended every year. Tetanus, diphtheria, and acellular pertussis (Tdap, Td) vaccine. You may need a Td booster every 10 years. Zoster vaccine. You may need this after age 49. Pneumococcal 13-valent conjugate (PCV13) vaccine. You may need this if you have certain conditions and were not previously vaccinated. Pneumococcal polysaccharide (PPSV23) vaccine. You may need one or two doses if you smoke cigarettes or if you have certain conditions. Talk to your health care provider about which screenings and vaccines you need and how often you need them. This information is not intended to replace advice given to you by your health care provider. Make sure you discuss any questions you have with your health care provider. Document Released: 05/06/2015 Document Revised: 12/28/2015 Document Reviewed: 02/08/2015 Elsevier Interactive Patient Education  2017 [employer] Prevention in the Home Falls can cause injuries. They can happen to people of all ages. There are many things you can do to make your home safe and to help prevent falls. What can I do on the outside of my home? Regularly fix the edges of walkways and driveways and fix any cracks. Remove anything that might make you trip as you walk through a door, such as a raised step or threshold. Trim any bushes or trees on the path to your home. Use bright outdoor lighting. Clear any walking paths of anything that might make someone trip, such as rocks or tools. Regularly check to see if handrails are loose or broken. Make sure that both sides of any steps have handrails. Any raised decks and porches should have guardrails on the edges. Have any leaves, snow, or ice cleared regularly. Use  sand or salt on walking paths during winter. Clean up any spills in your garage right away. This includes oil or grease spills. What can I do in the bathroom? Use night lights. Install grab bars by the toilet and in the tub and shower. Do not use towel bars as grab bars. Use non-skid mats or decals in the tub or shower. If you need to sit down in the shower, use a plastic, non-slip stool. Keep the floor dry. Clean up any water that spills on the floor as soon as it happens. Remove soap buildup in the tub or shower regularly. Attach bath mats securely with double-sided non-slip rug tape. Do not have throw rugs and other things on the floor that can make you trip. What can I do in the bedroom? Use night lights. Make sure that you have a light by your bed that is easy to reach. Do not use any sheets or blankets that are too big for your bed. They should not hang down onto the floor. Have a firm chair that has side arms. You can use this for support while you get dressed. Do not have throw rugs and other things on the floor that can make you trip. What can I do in the kitchen? Clean up any spills right away. Avoid walking on wet floors. Keep items that you use a lot in easy-to-reach places. If you need to reach something above you, use a strong step stool that has a grab bar.  Keep electrical cords out of the way. Do not use floor polish or wax that makes floors slippery. If you must use wax, use non-skid floor wax. Do not have throw rugs and other things on the floor that can make you trip. What can I do with my stairs? Do not leave any items on the stairs. Make sure that there are handrails on both sides of the stairs and use them. Fix handrails that are broken or loose. Make sure that handrails are as long as the stairways. Check any carpeting to make sure that it is firmly attached to the stairs. Fix any carpet that is loose or worn. Avoid having throw rugs at the top or bottom of the  stairs. If you do have throw rugs, attach them to the floor with carpet tape. Make sure that you have a light switch at the top of the stairs and the bottom of the stairs. If you do not have them, ask someone to add them for you. What else can I do to help prevent falls? Wear shoes that: Do not have high heels. Have rubber bottoms. Are comfortable and fit you well. Are closed at the toe. Do not wear sandals. If you use a stepladder: Make sure that it is fully opened. Do not climb a closed stepladder. Make sure that both sides of the stepladder are locked into place. Ask someone to hold it for you, if possible. Clearly mark and make sure that you can see: Any grab bars or handrails. First and last steps. Where the edge of each step is. Use tools that help you move around (mobility aids) if they are needed. These include: Canes. Walkers. Scooters. Crutches. Turn on the lights when you go into a dark area. Replace any light bulbs as soon as they burn out. Set up your furniture so you have a clear path. Avoid moving your furniture around. If any of your floors are uneven, fix them. If there are any pets around you, be aware of where they are. Review your medicines with your doctor. Some medicines can make you feel dizzy. This can increase your chance of falling. Ask your doctor what other things that you can do to help prevent falls. This information is not intended to replace advice given to you by your health care provider. Make sure you discuss any questions you have with your health care provider. Document Released: 02/03/2009 Document Revised: 09/15/2015 Document Reviewed: 05/14/2014 Elsevier Interactive Patient Education  2017 [employer].

## 2022-02-20 ENCOUNTER — Encounter: Payer: Self-pay | Admitting: Nurse Practitioner

## 2022-02-26 ENCOUNTER — Other Ambulatory Visit: Payer: Self-pay | Admitting: Physical Medicine and Rehabilitation

## 2022-02-26 ENCOUNTER — Other Ambulatory Visit: Payer: Self-pay | Admitting: Nurse Practitioner

## 2022-03-04 ENCOUNTER — Other Ambulatory Visit: Payer: Self-pay | Admitting: Nurse Practitioner

## 2022-03-05 ENCOUNTER — Encounter: Payer: Self-pay | Admitting: Nurse Practitioner

## 2022-03-05 ENCOUNTER — Other Ambulatory Visit: Payer: Self-pay | Admitting: Nurse Practitioner

## 2022-03-05 ENCOUNTER — Ambulatory Visit (INDEPENDENT_AMBULATORY_CARE_PROVIDER_SITE_OTHER): Payer: Medicare Other | Admitting: Nurse Practitioner

## 2022-03-05 ENCOUNTER — Ambulatory Visit: Admission: RE | Admit: 2022-03-05 | Payer: Medicare Other | Source: Ambulatory Visit

## 2022-03-05 VITALS — BP 112/64 | HR 79 | Temp 98.5°F | Ht 60.0 in | Wt 268.0 lb

## 2022-03-05 DIAGNOSIS — Z6841 Body Mass Index (BMI) 40.0 and over, adult: Secondary | ICD-10-CM

## 2022-03-05 DIAGNOSIS — R1900 Intra-abdominal and pelvic swelling, mass and lump, unspecified site: Secondary | ICD-10-CM

## 2022-03-05 DIAGNOSIS — E66813 Obesity, class 3: Secondary | ICD-10-CM

## 2022-03-05 DIAGNOSIS — D508 Other iron deficiency anemias: Secondary | ICD-10-CM

## 2022-03-05 DIAGNOSIS — Z79899 Other long term (current) drug therapy: Secondary | ICD-10-CM

## 2022-03-05 DIAGNOSIS — R82998 Other abnormal findings in urine: Secondary | ICD-10-CM | POA: Diagnosis not present

## 2022-03-05 DIAGNOSIS — Z23 Encounter for immunization: Secondary | ICD-10-CM

## 2022-03-05 DIAGNOSIS — Z636 Dependent relative needing care at home: Secondary | ICD-10-CM | POA: Diagnosis not present

## 2022-03-05 DIAGNOSIS — E11620 Type 2 diabetes mellitus with diabetic dermatitis: Secondary | ICD-10-CM

## 2022-03-05 DIAGNOSIS — Z0000 Encounter for general adult medical examination without abnormal findings: Secondary | ICD-10-CM | POA: Diagnosis not present

## 2022-03-05 DIAGNOSIS — I10 Essential (primary) hypertension: Secondary | ICD-10-CM

## 2022-03-05 DIAGNOSIS — M170 Bilateral primary osteoarthritis of knee: Secondary | ICD-10-CM

## 2022-03-05 LAB — POCT URINALYSIS DIPSTICK
Bilirubin, UA: NEGATIVE
Blood, UA: NEGATIVE
Glucose, UA: NEGATIVE
Ketones, UA: NEGATIVE
Nitrite, UA: POSITIVE
Protein, UA: POSITIVE — AB
Spec Grav, UA: 1.015 (ref 1.010–1.025)
Urobilinogen, UA: 0.2 E.U./dL
pH, UA: 8.5 — AB (ref 5.0–8.0)

## 2022-03-05 NOTE — Progress Notes (Signed)
I,Tianna Badgett,acting as a Education administrator for [employer], FNP.,have documented all relevant documentation on the behalf of [name], FNP,as directed by  [name], FNP while in the presence of [name], [place].  Subjective:     Patient ID: Ashley Lindsey , female    DOB: [birth year] , 61 y.o.   MRN: [id number]   Chief Complaint  Patient presents with   Annual Exam    HPI  Here for HM. She has been to the Cardiologist since her last visit. She has also been to Dr. Durward Fortes (osteoarthritis). She has also seen Dr. Adam Phenix for her fibromyalgia. She is no longer planning to move to [place].      Past Medical History:  Diagnosis Date   Abdominal cramps    Anxiety    Arthritis    Cancer ([facility])    s/p radiation- last dose 6/12//has been off Tamoxifen 8/12   Chronic headaches    Costochondritis    Fatigue    Fibromyalgia 03/06/2013   GERD (gastroesophageal reflux disease)    History of COVID-19    Hypertension    Lymphedema    RIGHT ARM-////  STATES USE LEFT ARM FOR BP'S   Migraines    Muscle spasms of head and/or neck    following to the breast   Passed out    4th of july   Personal history of radiation therapy 2010   lt breast    S/P radiation therapy 08/19/07 - 10/10/07   Left Breast/5040 cGy/28 fractions with Boost for a toatl dose of 6300 dGy   S/P radiation therapy 08/14/10 -10/03/10   right breast   Sleep apnea    STOP BANG SCORE 5     Family History  Problem Relation Age of Onset   Breast cancer Mother 33   Heart disease Father    Cancer Maternal Aunt 63       d. from "female cancer" possibly cervical   Breast cancer Maternal Aunt 60   Prostate cancer Maternal Uncle 78   Prostate cancer Maternal Uncle 80   Prostate cancer Paternal Uncle    Prostate cancer Paternal Uncle    Prostate cancer Maternal Grandfather 45   Breast cancer Cousin 43       mat 1st cousin     Current Outpatient Medications:    acetaminophen (TYLENOL) 650 MG CR tablet, Take 650 mg  by mouth every 8 (eight) hours as needed for pain., Disp: , Rfl:    albuterol (VENTOLIN HFA) 108 (90 Base) MCG/ACT inhaler, INHALE 1-2 PUFFS INTO THE LUNGS EVERY 6 (SIX) HOURS AS NEEDED FOR WHEEZING OR SHORTNESS OF BREATH., Disp: 6.7 each, Rfl: 1   aspirin-acetaminophen-caffeine (EXCEDRIN MIGRAINE) 250-250-65 MG tablet, Take 1 tablet by mouth every 6 (six) hours as needed for headache., Disp: , Rfl:    atorvastatin (LIPITOR) 10 MG tablet, Take 1 tablet (10 mg total) by mouth Nightly., Disp: 30 tablet, Rfl: 3   benzonatate (TESSALON PERLES) 100 MG capsule, Take 1 capsule (100 mg total) by mouth every 6 (six) hours as needed., Disp: 30 capsule, Rfl: 1   Bisacodyl (DULCOLAX PO), Take 1 capsule by mouth daily as needed (constipation). , Disp: , Rfl:    cholecalciferol 5000 units TABS, Take 1 tablet (5,000 Units total) by mouth 2 (two) times daily. (Patient taking differently: Take 5,000 Units by mouth daily.), Disp: , Rfl:    cyclobenzaprine (FLEXERIL) 10 MG tablet, TAKE 1 TABLET BY MOUTH EVERY DAY AS NEEDED, Disp: 90 tablet, Rfl:  1   dexlansoprazole (DEXILANT) 60 MG capsule, TAKE 1 CAPSULE BY MOUTH EVERY DAY, Disp: 90 capsule, Rfl: 1   diclofenac Sodium (VOLTAREN) 1 % GEL, Apply 2 g topically 4 (four) times daily., Disp: 2 g, Rfl: 3   diphenhydrAMINE (BENADRYL) 25 MG tablet, Take 1 tablet (25 mg total) by mouth every 6 (six) hours as needed for up to 20 doses (migraine). Take with reglan for migraine headache, Disp: 20 tablet, Rfl: 0   ELDERBERRY PO, Take 1 tablet by mouth daily., Disp: , Rfl:    ENTRESTO 24-26 MG, TAKE 1 TABLET BY MOUTH TWICE A DAY, Disp: 180 tablet, Rfl: 0   fish oil-omega-3 fatty acids 1000 MG capsule, Take 1 g by mouth daily., Disp: , Rfl:    Flaxseed, Linseed, (FLAXSEED OIL MAX STR PO), Take 1 capsule by mouth daily. , Disp: , Rfl:    gabapentin (NEURONTIN) 300 MG capsule, TAKE 1 CAPSULE BY MOUTH THREE TIMES A DAY, Disp: 90 capsule, Rfl: 3   Magnesium 100 MG TABS, Take 1 tablet by  mouth daily. Takes 50 mg, Disp: , Rfl:    meloxicam (MOBIC) 7.5 MG tablet, Take 1 tablet (7.5 mg total) by mouth daily., Disp: 90 tablet, Rfl: 0   mometasone (NASONEX) 50 MCG/ACT nasal spray, Place 2 sprays into the nose daily., Disp: 1 each, Rfl: 2   Multiple Vitamins-Minerals (MULTIVITAMIN & MINERAL PO), Take 1 tablet by mouth daily., Disp: , Rfl:    mupirocin ointment (BACTROBAN) 2 %, Apply 1 Application topically 2 (two) times daily., Disp: 22 g, Rfl: 0   nitrofurantoin, macrocrystal-monohydrate, (MACROBID) 100 MG capsule, Take 1 capsule (100 mg total) by mouth 2 (two) times daily., Disp: 10 capsule, Rfl: 0   omeprazole (PRILOSEC) 20 MG capsule, Take 20 mg by mouth daily., Disp: , Rfl:    ondansetron (ZOFRAN) 4 MG tablet, TAKE 1 TABLET BY MOUTH DAILY AS NEEDED FOR NAUSEA OR VOMITING, Disp: 30 tablet, Rfl: 1   OVER THE COUNTER MEDICATION, Take 125 mg by mouth daily. [place]/Simethicone, Disp: , Rfl:    propranolol (INDERAL) 10 MG tablet, TAKE 1 TABLET BY MOUTH TWICE A DAY *HOLD IF SYSTOLIC BP (TOP #) IS <875IEPP OR PULSE IS <60BPM, Disp: 180 tablet, Rfl: 1   spironolactone (ALDACTONE) 25 MG tablet, TAKE 1 TABLET BY MOUTH EVERY DAY IN THE MORNING, Disp: 90 tablet, Rfl: 0   SYMBICORT 80-4.5 MCG/ACT inhaler, INHALE 2 PUFFS INTO THE LUNGS IN THE MORNING AND AT BEDTIME., Disp: 30.6 each, Rfl: 1   topiramate (TOPAMAX) 25 MG tablet, Take 1 tablet (25 mg total) by mouth 2 (two) times daily., Disp: 180 tablet, Rfl: 3   triamcinolone cream (KENALOG) 0.1 %, APPLY 1 APPLICATION TOPICALLY 2 (TWO) TIMES DAILY AS NEEDED (RASH)., Disp: 60 g, Rfl: 1   Turmeric 500 MG CAPS, Take by mouth., Disp: , Rfl:    venlafaxine XR (EFFEXOR-XR) 150 MG 24 hr capsule, TAKE 1 CAPSULE BY MOUTH NIGHTLY WITH 75 MG CAPSULE (TOTAL OF 225 MG NIGHTLY), Disp: 90 capsule, Rfl: 0   venlafaxine XR (EFFEXOR-XR) 75 MG 24 hr capsule, Total dose 225 mg, Disp: 90 capsule, Rfl: 3   Vitamin D, Ergocalciferol, (DRISDOL) 1.25 MG (50000 UNIT) CAPS  capsule, Take 1 capsule (50,000 Units total) by mouth every 7 (seven) days., Disp: 20 capsule, Rfl: 0   vitamin E 200 UNIT capsule, Take 200 Units by mouth daily., Disp: , Rfl:    Allergies  Allergen Reactions   Aspirin     unknown  Penicillins Hives and Swelling    Has patient had a PCN reaction causing immediate rash, facial/tongue/throat swelling, SOB or lightheadedness with hypotension: Yes Has patient had a PCN reaction causing severe rash involving mucus membranes or skin necrosis: Yes Has patient had a PCN reaction that required hospitalization Yes Has patient had a PCN reaction occurring within the last 10 years: No If all of the above answers are "NO", then may proceed with Cephalosporin use.       The patient states she is status post hysterectomy.  Negative for: breast discharge, breast lump(s), breast pain and breast self exam. Associated symptoms include abnormal vaginal bleeding. Pertinent negatives include abnormal bleeding (hematology), anxiety, decreased libido, depression, difficulty falling sleep, dyspareunia, history of infertility, nocturia, sexual dysfunction, sleep disturbances, urinary incontinence, urinary urgency, vaginal discharge and vaginal itching. Diet regular.The patient states her exercise level is    The patient's tobacco use is:  Social History   Tobacco Use  Smoking Status Former   Packs/day: 0.25   Years: 1.00   Total pack years: 0.25   Types: Cigarettes  Smokeless Tobacco Never  She has been exposed to passive smoke. The patient's alcohol use is:  Social History   Substance and Sexual Activity  Alcohol Use Not Currently     Review of Systems  Constitutional: Negative.   HENT: Negative.    Eyes: Negative.   Respiratory: Negative.    Cardiovascular: Negative.   Gastrointestinal: Negative.   Endocrine: Negative.   Genitourinary: Negative.   Musculoskeletal:  Positive for arthralgias.       Bilateral knee pain  Skin: Negative.    Allergic/Immunologic: Negative.   Neurological: Negative.   Hematological: Negative.   Psychiatric/Behavioral: Negative.       Today's Vitals   03/05/22 1015  BP: 112/64  Pulse: 79  Temp: 98.5 F (36.9 C)  TempSrc: Oral  Weight: 268 lb (121.6 kg)  Height: 5' (1.524 m)   Body mass index is 52.34 kg/m.  Wt Readings from Last 3 Encounters:  03/12/22 269 lb 6.4 oz (122.2 kg)  03/06/22 269 lb 4 oz (122.1 kg)  03/05/22 268 lb (121.6 kg)    Objective:  Physical Exam Vitals reviewed.  Constitutional:      General: She is not in acute distress.    Appearance: Normal appearance. She is well-developed. She is obese.  HENT:     Head: Normocephalic and atraumatic.     Right Ear: Hearing, tympanic membrane, ear canal and external ear normal. There is no impacted cerumen.     Left Ear: Hearing, tympanic membrane, ear canal and external ear normal. There is no impacted cerumen.     Nose:     Comments: Deferred - masked    Mouth/Throat:     Comments: Deferred - masked Eyes:     General: Lids are normal.     Extraocular Movements: Extraocular movements intact.     Conjunctiva/sclera: Conjunctivae normal.     Pupils: Pupils are equal, round, and reactive to light.     Funduscopic exam:    Right eye: No papilledema.        Left eye: No papilledema.  Neck:     Thyroid: No thyroid mass.     Vascular: No carotid bruit.  Cardiovascular:     Rate and Rhythm: Normal rate and regular rhythm.     Pulses: Normal pulses.     Heart sounds: Normal heart sounds. No murmur heard. Pulmonary:     Effort: Pulmonary effort is  normal. No respiratory distress.     Breath sounds: Normal breath sounds. No wheezing.  Chest:     Chest wall: No mass or swelling.  Breasts:    Tanner Score is 5.     Right: Normal. No swelling, mass or tenderness.     Left: Normal. No swelling, mass or tenderness.  Abdominal:     General: Bowel sounds are normal. There is distension.     Palpations: Abdomen is  soft.     Tenderness: There is abdominal tenderness (right lower abdomen area).     Hernia: A hernia is present.  Musculoskeletal:        General: No swelling or tenderness. Normal range of motion.     Cervical back: Full passive range of motion without pain, normal range of motion and neck supple.     Right lower leg: No edema.     Left lower leg: No edema.  Lymphadenopathy:     Upper Body:     Right upper body: No supraclavicular, axillary or pectoral adenopathy.     Left upper body: No supraclavicular, axillary or pectoral adenopathy.  Skin:    General: Skin is warm and dry.     Capillary Refill: Capillary refill takes less than 2 seconds.     Coloration: Skin is not jaundiced.     Findings: No bruising.  Neurological:     General: No focal deficit present.     Mental Status: She is alert and oriented to person, place, and time.     Cranial Nerves: No cranial nerve deficit.     Sensory: No sensory deficit.     Motor: No weakness.  Psychiatric:        Mood and Affect: Mood normal.        Behavior: Behavior normal.        Thought Content: Thought content normal.        Judgment: Judgment normal.      Assessment And Plan:     1. Encounter for annual physical exam Behavior modifications discussed and diet history reviewed.   Pt will continue to exercise regularly and modify diet with low GI, plant based foods and decrease intake of processed foods.  Recommend intake of daily multivitamin, Vitamin D, and calcium.  Recommend mammogram and colonoscopy for preventive screenings, as well as recommend immunizations that include influenza, TDAP, and Shingles  2. Need for shingles vaccine Shingrix #2 done with TransRx signed - Zoster Recombinant (Shingrix )  3. Essential hypertension Comments: Blood pressure is well controlled. Continue current medications - Amb Referral To Provider Referral Exercise Program (P.R.E.P)  4. Type 2 diabetes mellitus with diabetic dermatitis,  without long-term current use of insulin (HCC) Comments: Continue medications. HgbA1c is stable. - Hemoglobin A1c - Lipid panel - Renal function panel with eGFR - Amb Referral To Provider Referral Exercise Program (P.R.E.P) - Microalbumin / Creatinine Urine Ratio - POCT Urinalysis Dipstick (81002)  5. Iron deficiency anemia secondary to inadequate dietary iron intake Comments: Will check her iron studies, pending results will add iron supplement - Iron, TIBC and Ferritin Panel  6. Bilateral primary osteoarthritis of knee Comments: She has to lose a total of 100 lbs before having knee surgery.  She will discuss with her Ortho provider about the scooter. I feel like this may limit her  7. Other long term (current) drug therapy - CBC  8. Class 3 severe obesity due to excess calories with serious comorbidity and body mass index (BMI) of 50.0  to 59.9 in adult [facility]) She is encouraged to strive for BMI less than 30 to decrease cardiac risk. Advised to aim for at least 150 minutes of exercise per week. - Amb Referral To Provider Referral Exercise Program (P.R.E.P)  9. Abdominal wall bulge Comments: she is scheduled to hae a CT scan this evening and to f/u with [facility] tomorrow.  10. Caregiver stress Comments: Will refer to therapist continues to care for her mother and uncle. - Ambulatory referral to Psychology  11. Leukocytes in urine Comments: Will send Rx for nitrofuratoin and send for culture. - Culture, Urine  Patient was given opportunity to ask questions. Patient verbalized understanding of the plan and was able to repeat key elements of the plan. All questions were answered to their satisfaction.   [name], FNP   I, [name], FNP, have reviewed all documentation for this visit. The documentation on 03/05/22 for the exam, diagnosis, procedures, and orders are all accurate and complete.   THE PATIENT IS ENCOURAGED TO PRACTICE SOCIAL DISTANCING DUE TO THE COVID-19  PANDEMIC.

## 2022-03-05 NOTE — Patient Instructions (Signed)

## 2022-03-06 ENCOUNTER — Encounter: Payer: Self-pay | Admitting: Gastroenterology

## 2022-03-06 ENCOUNTER — Ambulatory Visit (INDEPENDENT_AMBULATORY_CARE_PROVIDER_SITE_OTHER): Payer: Medicare Other | Admitting: Gastroenterology

## 2022-03-06 ENCOUNTER — Telehealth: Payer: Self-pay | Admitting: Pharmacy Technician

## 2022-03-06 ENCOUNTER — Other Ambulatory Visit: Payer: Self-pay | Admitting: Nurse Practitioner

## 2022-03-06 VITALS — BP 126/68 | HR 92 | Ht 61.0 in | Wt 269.2 lb

## 2022-03-06 DIAGNOSIS — D509 Iron deficiency anemia, unspecified: Secondary | ICD-10-CM | POA: Diagnosis not present

## 2022-03-06 DIAGNOSIS — K5909 Other constipation: Secondary | ICD-10-CM | POA: Diagnosis not present

## 2022-03-06 DIAGNOSIS — R112 Nausea with vomiting, unspecified: Secondary | ICD-10-CM

## 2022-03-06 DIAGNOSIS — N3000 Acute cystitis without hematuria: Secondary | ICD-10-CM

## 2022-03-06 DIAGNOSIS — R1013 Epigastric pain: Secondary | ICD-10-CM

## 2022-03-06 DIAGNOSIS — R1084 Generalized abdominal pain: Secondary | ICD-10-CM

## 2022-03-06 HISTORY — DX: Iron deficiency anemia, unspecified: D50.9

## 2022-03-06 LAB — HEMOGLOBIN A1C
Est. average glucose Bld gHb Est-mCnc: 120 mg/dL
Hgb A1c MFr Bld: 5.8 % — ABNORMAL HIGH (ref 4.8–5.6)

## 2022-03-06 LAB — RENAL FUNCTION PANEL
Albumin: 4.2 g/dL (ref 3.9–4.9)
BUN/Creatinine Ratio: 13 (ref 12–28)
BUN: 9 mg/dL (ref 8–27)
CO2: 26 mmol/L (ref 20–29)
Calcium: 9.4 mg/dL (ref 8.7–10.3)
Chloride: 103 mmol/L (ref 96–106)
Creatinine, Ser: 0.71 mg/dL (ref 0.57–1.00)
Glucose: 95 mg/dL (ref 70–99)
Phosphorus: 3.6 mg/dL (ref 3.0–4.3)
Potassium: 4.6 mmol/L (ref 3.5–5.2)
Sodium: 140 mmol/L (ref 134–144)
eGFR: 95 mL/min/{1.73_m2} (ref >59)

## 2022-03-06 LAB — CBC
Hematocrit: 29.2 % — ABNORMAL LOW (ref 34.0–46.6)
Hemoglobin: 8.2 g/dL — ABNORMAL LOW (ref 11.1–15.9)
MCH: 19.2 pg — ABNORMAL LOW (ref 26.6–33.0)
MCHC: 28.1 g/dL — ABNORMAL LOW (ref 31.5–35.7)
MCV: 69 fL — ABNORMAL LOW (ref 79–97)
Platelets: 259 10*3/uL (ref 150–450)
RBC: 4.26 x10E6/uL (ref 3.77–5.28)
RDW: 18.3 % — ABNORMAL HIGH (ref 11.7–15.4)
WBC: 10 10*3/uL (ref 3.4–10.8)

## 2022-03-06 LAB — MICROALBUMIN / CREATININE URINE RATIO
Creatinine, Urine: 148.8 mg/dL
Microalb/Creat Ratio: <2 mg/g creat (ref 0–29)
Microalbumin, Urine: <3 ug/mL

## 2022-03-06 LAB — IRON,TIBC AND FERRITIN PANEL
Ferritin: 11 ng/mL — ABNORMAL LOW (ref 15–150)
Iron Saturation: 7 % — CL (ref 15–55)
Iron: 26 ug/dL — ABNORMAL LOW (ref 27–139)
Total Iron Binding Capacity: 395 ug/dL (ref 250–450)
UIBC: 369 ug/dL (ref 118–369)

## 2022-03-06 LAB — LIPID PANEL
Chol/HDL Ratio: 2.7 ratio (ref 0.0–4.4)
Cholesterol, Total: 213 mg/dL — ABNORMAL HIGH (ref 100–199)
HDL: 78 mg/dL (ref >39)
LDL Chol Calc (NIH): 116 mg/dL — ABNORMAL HIGH (ref 0–99)
Triglycerides: 111 mg/dL (ref 0–149)
VLDL Cholesterol Cal: 19 mg/dL (ref 5–40)

## 2022-03-06 MED ORDER — NITROFURANTOIN MONOHYD MACRO 100 MG PO CAPS: 100.0000 mg | ORAL_CAPSULE | Freq: Two times a day (BID) | ORAL | 0 refills | Status: DC

## 2022-03-06 NOTE — Addendum Note (Signed)
Addended by: Nelida Meuse on: 03/06/2022 12:26 PM   Modules accepted: Orders

## 2022-03-06 NOTE — Telephone Encounter (Signed)
Auth Submission: NO AUTH NEEDED Payer: [employer] Medication & CPT/J Code(s) submitted: Feraheme (ferumoxytol) [id number] Route of submission (phone, fax, portal):  Phone # Fax # Auth type: Buy/Bill Units/visits requested: X2 Reference number: [id number] Approval from: 03/06/22 to 04/22/22

## 2022-03-06 NOTE — Patient Instructions (Addendum)
_______________________________________________________  If you are age 63 or older, your body mass index should be between 23-30. Your Body mass index is 50.87 kg/m. If this is out of the aforementioned range listed, please consider follow up with your Primary Care Provider.  If you are age 31 or younger, your body mass index should be between 19-25. Your Body mass index is 50.87 kg/m. If this is out of the aformentioned range listed, please consider follow up with your Primary Care Provider.   ________________________________________________________  The [facility] providers would like to encourage you to use [facility] to communicate with providers for non-urgent requests or questions.  Due to long hold times on the telephone, sending your provider a message by [facility] may be a faster and more efficient way to get a response.  Please allow 48 business hours for a response.  Please remember that this is for non-urgent requests.  _______________________________________________________  Please start Miralax 1 capful a day, increase to twice a day if needed.  Stop Excedrin   We will contact you about scheduling a procedure at the hospital.  It was a pleasure to see you today!  Thank you for trusting me with your gastrointestinal care!
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
## 2022-03-07 ENCOUNTER — Encounter: Payer: Self-pay | Admitting: Nurse Practitioner

## 2022-03-08 ENCOUNTER — Telehealth: Payer: Self-pay

## 2022-03-08 ENCOUNTER — Telehealth: Payer: Self-pay | Admitting: Gastroenterology

## 2022-03-08 DIAGNOSIS — D509 Iron deficiency anemia, unspecified: Secondary | ICD-10-CM

## 2022-03-08 LAB — URINE CULTURE

## 2022-03-08 NOTE — Telephone Encounter (Signed)
Patient seen for office consult 03/06/2022.  Needs EGD and colonoscopy for iron deficiency anemia in the hospital endoscopy lab due to elevated BMI.  Please contact her and put her on my next available date, which is 04/30/2022  Split dose GoLytely prep  I also placed orders for her to get iron infusions.  - The infusion center got the approval and plans to contact her to arrange that soon.  CBC and iron studies in 4 weeks.  - H. Loletha Carrow, MD

## 2022-03-08 NOTE — Telephone Encounter (Signed)
VMT pt requesting call back reference PREP referral.   

## 2022-03-10 ENCOUNTER — Other Ambulatory Visit: Payer: Self-pay | Admitting: Hematology and Oncology

## 2022-03-12 ENCOUNTER — Other Ambulatory Visit: Payer: Self-pay

## 2022-03-12 ENCOUNTER — Ambulatory Visit (INDEPENDENT_AMBULATORY_CARE_PROVIDER_SITE_OTHER): Payer: Medicare Other

## 2022-03-12 VITALS — BP 117/76 | HR 81 | Temp 97.8°F | Resp 18 | Ht 62.0 in | Wt 269.4 lb

## 2022-03-12 DIAGNOSIS — D509 Iron deficiency anemia, unspecified: Secondary | ICD-10-CM

## 2022-03-12 DIAGNOSIS — E7800 Pure hypercholesterolemia, unspecified: Secondary | ICD-10-CM

## 2022-03-12 MED ORDER — ATORVASTATIN CALCIUM 10 MG PO TABS: 10.0000 mg | ORAL_TABLET | Freq: Every evening | ORAL | 3 refills | Status: DC

## 2022-03-12 MED ORDER — ACETAMINOPHEN 325 MG PO TABS
650.0000 mg | ORAL_TABLET | Freq: Once | ORAL | Status: AC
  Administered 2022-03-12: 650 mg via ORAL
  Filled 2022-03-12: qty 2

## 2022-03-12 MED ORDER — SODIUM CHLORIDE 0.9 % IV SOLN
510.0000 mg | Freq: Once | INTRAVENOUS | Status: AC
  Administered 2022-03-12: 510 mg via INTRAVENOUS
  Filled 2022-03-12: qty 17

## 2022-03-12 MED ORDER — DIPHENHYDRAMINE HCL 25 MG PO CAPS
25.0000 mg | ORAL_CAPSULE | Freq: Once | ORAL | Status: AC
  Administered 2022-03-12: 25 mg via ORAL
  Filled 2022-03-12: qty 1

## 2022-03-12 NOTE — Progress Notes (Signed)
Diagnosis: Iron Deficiency Anemia  Provider:  Marshell Garfinkel MD  Procedure: Infusion  IV Type: Peripheral, IV Location: L Hand  Feraheme (Ferumoxytol), Dose: 510 mg  Infusion Start Time: 4174  Infusion Stop Time: 0814  Post Infusion IV Care: Observation period completed and Peripheral IV Discontinued  Discharge: Condition: Good, Destination: Home . AVS provided to patient.   Performed by:  Arnoldo Morale, RN

## 2022-03-12 NOTE — Progress Notes (Signed)
We need to start her on atorvastatin 10 mg daily at bedtime #30 tabs with 3 refills and have her to f/u in 8 weeks for liver functions.

## 2022-03-14 ENCOUNTER — Other Ambulatory Visit: Payer: Self-pay

## 2022-03-14 DIAGNOSIS — D509 Iron deficiency anemia, unspecified: Secondary | ICD-10-CM

## 2022-03-14 MED ORDER — GOLYTELY 236 G PO SOLR: 4000.0000 mL | Freq: Once | ORAL | 0 refills | Status: AC

## 2022-03-14 NOTE — Telephone Encounter (Signed)
Patient has been scheduled for ECL at [facility] on 04/30/22 at 8:30 am. Pt will need to arrive with a care partner by 7 am.  Ambulatory referral to GI in epic. Golytely prep sent to patient's pharmacy. Lab reminder in epic.  Lm on vm for patient to return call to discuss. Will send instructions once appt is confirmed.

## 2022-03-14 NOTE — Addendum Note (Signed)
Addended by: Yevette Edwards on: 03/14/2022 01:55 PM   Modules accepted: Orders

## 2022-03-15 ENCOUNTER — Other Ambulatory Visit: Payer: Self-pay | Admitting: Cardiology

## 2022-03-15 DIAGNOSIS — I429 Cardiomyopathy, unspecified: Secondary | ICD-10-CM

## 2022-03-19 NOTE — Telephone Encounter (Signed)
Called and spoke with patient regarding recommendations and appt information for ECL. Pt is aware that she will need to pick up her prep from the pharmacy this week. Pt knows that I will mail and send her instructions to her via [place]. Pt is aware that I will remind her about repeat labs in a couple of weeks. Pt still has some ongoing concerns. Pt reports that her last BM was about 2 days ago. Pt continuously feels nauseated and takes Zofran BID, in the AM and PM. Pt states that she was told to cut back on Zofran, I told her that I did not see that in her chart but she was advised to stop Excedrin. Pt still has not stopped Excedrin because that is the only thing that helps her migraines. Pt states that she is getting headaches because she is constipated. Pt took 3 doses of Miralax yesterday but she is now out of the samples. Pt asked for more samples until she can purchase some on Friday. We only have enough for a few days. I told pt to stop by the 2nd floor receptionist desk to pick up samples. Pt wants to know if she can continue Zofran, 2-3 x daily? Pt states that this is the only thing that helps her get some food down. Pt states that she is also looking for a new PCP within [place] and wanted to know if you had any recommendations. Please advise, thanks.   ECL instructions mailed and sent to patient via MyChart.

## 2022-03-20 NOTE — Telephone Encounter (Signed)
I understand why she takes Excedrin, but I will reiterate my concerns that it is possibly causing ulcers and therefore anemia.  Alternate therapy for headaches should be addressed with her PCP.  It is her decision whether or not to continue that medicine.  Generic MiraLAX is available over-the-counter.  I had only suggested cutting back the Zofran because it may be worsening her constipation, but I will leave it to her discretion.  HD

## 2022-03-20 NOTE — Telephone Encounter (Signed)
Called and spoke with patient regarding recommendations as outlined below. Spent 15 minutes on the phone with patient. Pt states that her PCP has tried several medications and none of them work. I told pt that she may need to see a neurologist, she states that she has one but has not seen her in over a year. I encouraged pt to reach out to them for recommendations and alternative therapy for migraines. Pt states that she finally had a good BM so the Miralax must be working. Pt is going to try and cut back on the Zofran. Pt states that she will try some Dramamine OTC to see if that helps. I told pt that if she does have an ulcer that could be causing all of her symptoms but we can't confirm that until her procedures. Pt questioned what type of diet she should be following, I explained to her that the can follow a diet similar to antireflux diet. Pt also wanted to know of any recommended PCP's within [place]. I told pt that there are several practices and she can go online and see which practice is accepting new patients. Pt states that she had dark stools, I told her that this could be related to the iron infusions. Pt states that she is very anxious but will try to follow the recommendations as outlined. I answered all of the patient's questions to the best of my ability. Pt had no concerns at the end of the call.

## 2022-03-23 ENCOUNTER — Ambulatory Visit (INDEPENDENT_AMBULATORY_CARE_PROVIDER_SITE_OTHER): Payer: Medicare Other

## 2022-03-23 VITALS — BP 147/66 | HR 88 | Temp 97.9°F | Resp 18 | Ht 62.0 in | Wt 266.6 lb

## 2022-03-23 DIAGNOSIS — D509 Iron deficiency anemia, unspecified: Secondary | ICD-10-CM

## 2022-03-23 MED ORDER — DIPHENHYDRAMINE HCL 25 MG PO CAPS
25.0000 mg | ORAL_CAPSULE | Freq: Once | ORAL | Status: AC
  Administered 2022-03-23: 25 mg via ORAL
  Filled 2022-03-23: qty 1

## 2022-03-23 MED ORDER — ACETAMINOPHEN 325 MG PO TABS
650.0000 mg | ORAL_TABLET | Freq: Once | ORAL | Status: AC
  Administered 2022-03-23: 650 mg via ORAL
  Filled 2022-03-23: qty 2

## 2022-03-23 MED ORDER — SODIUM CHLORIDE 0.9 % IV SOLN
510.0000 mg | Freq: Once | INTRAVENOUS | Status: AC
  Administered 2022-03-23: 510 mg via INTRAVENOUS
  Filled 2022-03-23: qty 17

## 2022-03-23 NOTE — Progress Notes (Addendum)
Diagnosis: Iron Deficiency Anemia  Provider:  Marshell Garfinkel MD  Procedure: Infusion  IV Type: Peripheral, IV Location: R Hand  Ferumoxytol, Dose: 510 mg  Infusion Start Time: 9597  Infusion Stop Time: 4718  Post Infusion IV Care: Peripheral IV Discontinued  Discharge: Condition: Good, Destination: Home . AVS provided to patient.   Performed by:  Koren Shiver, RN

## 2022-03-28 ENCOUNTER — Encounter: Payer: Self-pay | Admitting: Orthopaedic Surgery

## 2022-03-28 ENCOUNTER — Encounter: Payer: Self-pay | Admitting: Nurse Practitioner

## 2022-03-29 ENCOUNTER — Other Ambulatory Visit: Payer: Medicare Other

## 2022-03-29 ENCOUNTER — Ambulatory Visit (INDEPENDENT_AMBULATORY_CARE_PROVIDER_SITE_OTHER): Payer: Medicare Other | Admitting: Orthopaedic Surgery

## 2022-03-29 ENCOUNTER — Encounter: Payer: Self-pay | Admitting: Orthopaedic Surgery

## 2022-03-29 DIAGNOSIS — E7800 Pure hypercholesterolemia, unspecified: Secondary | ICD-10-CM | POA: Diagnosis not present

## 2022-03-29 DIAGNOSIS — M1712 Unilateral primary osteoarthritis, left knee: Secondary | ICD-10-CM | POA: Diagnosis not present

## 2022-03-29 MED ORDER — LIDOCAINE HCL 1 % IJ SOLN
2.0000 mL | INTRAMUSCULAR | Status: AC | PRN
  Administered 2022-03-29: 2 mL

## 2022-03-29 MED ORDER — METHYLPREDNISOLONE ACETATE 40 MG/ML IJ SUSP
80.0000 mg | INTRAMUSCULAR | Status: AC | PRN
  Administered 2022-03-29: 80 mg via INTRA_ARTICULAR

## 2022-03-29 MED ORDER — BUPIVACAINE HCL 0.25 % IJ SOLN
2.0000 mL | INTRAMUSCULAR | Status: AC | PRN
  Administered 2022-03-29: 2 mL via INTRA_ARTICULAR

## 2022-03-29 NOTE — Progress Notes (Signed)
Office Visit Note   Patient: Ashley Lindsey           Date of Birth: [birth year]           MRN: [id number] Visit Date: 03/29/2022              Requested by: [name], [address] [place],  [place] [address] PCP: [name], FNP   Assessment & Plan: Visit Diagnoses: Osteoarthritis left knee  Plan: Ms. Clopper comes in today requesting an injection into her left knee.  She has a history of osteoarthritis of this left knee as well as a history in the past of a tibial plateau fracture.  She was doing well until she aggravated her knee when she hit it over Thanksgiving.  She denies any other pain she denies any paresthesias.  Her last injection was in October and this helped her.  Will go forward with an injection today.  She also has recently lost 70 pounds.  She now weighs 259 pounds at 5 2.  To get down to an appropriate BMI she would need to weigh about 210 pounds or more ideally 200 pounds.  She is going to continue exercising and at some point would like to go forward with knee replacement when her BMI is 38 or less.  Would refer her to Dr. Ninfa Linden for discussion of knee replacement surgery.  Follow-Up Instructions: Return if symptoms worsen or fail to improve.   Orders:  Orders Placed This Encounter  Procedures   Large Joint Inj   No orders of the defined types were placed in this encounter.     Procedures: Large Joint Inj on 03/29/2022 2:59 PM Indications: pain and diagnostic evaluation Details: 25 G 1.5 in needle, anterolateral approach  Arthrogram: No  Medications: 80 mg methylPREDNISolone acetate 40 MG/ML; 2 mL lidocaine 1 %; 2 mL bupivacaine 0.25 % Outcome: tolerated well, no immediate complications Procedure, treatment alternatives, risks and benefits explained, specific risks discussed. Consent was given by the patient.      Clinical Data: No additional findings.   Subjective: Chief Complaint  Patient presents with   Left Knee -  Follow-up  Patient presents today for follow up on her left knee. She received a cortisone injection into her left knee on 02/01/22. She said that the injection helped, but the pain has returned.     Review of Systems  All other systems reviewed and are negative.    Objective: Vital Signs: There were no vitals taken for this visit.  Physical Exam Constitutional:      Appearance: Normal appearance.  Pulmonary:     Effort: Pulmonary effort is normal.  Skin:    General: Skin is warm and dry.  Neurological:     Mental Status: She is alert.     Ortho Exam Examination of left knee no ecchymosis mild soft tissue swelling no effusion.  Mild warmth.  She has full range of motion.  Tender over the medial and lateral joint line.  Neurovascular intact compartments are soft and nontender Specialty Comments:  No specialty comments available.  Imaging: No results found.   PMFS History: Patient Active Problem List   Diagnosis Date Noted   Iron deficiency anemia 03/06/2022   Unilateral primary osteoarthritis, left knee 10/02/2021   Pulmonary nodules 07/12/2021   Chronic cough 07/12/2021   Avulsion fracture of middle phalanx of finger 04/18/2021   Tibial plateau fracture, left 04/18/2021   Asymmetrical sensorineural hearing loss 02/02/2021  Bilateral primary osteoarthritis of knee 02/09/2020   Chronic intermittent hypoxia with obstructive sleep apnea 08/24/2019   Chronic intractable headache 07/14/2019   Morning headache 07/14/2019   Loud snoring 07/14/2019   Super obesity 07/14/2019   Sleeps in sitting position due to orthopnea 07/14/2019   Chest pain due to GERD 07/14/2019   Nocturia more than twice per night 07/14/2019   Unilateral primary osteoarthritis, right knee 05/26/2019   Chronic migraine without aura without status migrainosus, not intractable 05/13/2019   Class 3 severe obesity due to excess calories without serious comorbidity with body mass index (BMI) of 40.0 to  44.9 in adult ([place]) 08/05/2018   Depression 08/05/2018   Chronic nonintractable headache 08/05/2018   Elevated blood-pressure reading without diagnosis of hypertension 06/06/2018   Chronic pain of right ankle 04/24/2018   Breast cancer of lower-outer quadrant of right female breast ([place]) 12/02/2014   Bronchospasm 09/21/2014   Dyspnea 09/21/2014   Chronic pain of right knee 09/21/2014   Fibromyalgia 03/06/2013   Dense breasts 03/06/2013   Mastalgia 03/06/2013   Back pain, lumbosacral 03/06/2013   Lymphedema of arm 03/06/2013   Atypical chest pain 01/06/2013   Breast cancer of upper-inner quadrant of left female breast ([place]) 09/08/2012   Family history of malignant neoplasm of breast 09/08/2012   S/P radiation therapy    History of breast cancer in female 08/13/2011   Past Medical History:  Diagnosis Date   Abdominal cramps    Anxiety    Arthritis    Cancer ([place])    s/p radiation- last dose 6/12//has been off Tamoxifen 8/12   Chronic headaches    Costochondritis    Fatigue    Fibromyalgia 03/06/2013   GERD (gastroesophageal reflux disease)    History of COVID-19    Hypertension    Lymphedema    RIGHT ARM-////  STATES USE LEFT ARM FOR BP'S   Migraines    Muscle spasms of head and/or neck    following to the breast   Passed out    4th of july   Personal history of radiation therapy 2010   lt breast    S/P radiation therapy 08/19/07 - 10/10/07   Left Breast/5040 cGy/28 fractions with Boost for a toatl dose of 6300 dGy   S/P radiation therapy 08/14/10 -10/03/10   right breast   Sleep apnea    STOP BANG SCORE 5    Family History  Problem Relation Age of Onset   Breast cancer Mother 59   Heart disease Father    Cancer Maternal Aunt 63       d. from "female cancer" possibly cervical   Breast cancer Maternal Aunt 60   Prostate cancer Maternal Uncle 78   Prostate cancer Maternal Uncle 80   Prostate cancer Paternal Uncle    Prostate cancer Paternal Uncle    Prostate  cancer Maternal Grandfather 54   Breast cancer Cousin 26       mat 1st cousin    Past Surgical History:  Procedure Laterality Date   ABDOMINAL HYSTERECTOMY     with left salpingooophorectomy   BREAST LUMPECTOMY  2009/2012   LEFT/RIGHT with lymph node dissection   KNEE ARTHROSCOPY     right   OOPHORECTOMY  2013   Social History   Occupational History   Occupation: disability  Tobacco Use   Smoking status: Former    Packs/day: 0.25    Years: 1.00    Total pack years: 0.25    Types: Cigarettes  Smokeless tobacco: Never  Vaping Use   Vaping Use: Never used  Substance and Sexual Activity   Alcohol use: Not Currently   Drug use: No   Sexual activity: Not Currently    Birth control/protection: Surgical

## 2022-03-30 LAB — HEPATIC FUNCTION PANEL
ALT: 13 IU/L (ref 0–32)
AST: 16 IU/L (ref 0–40)
Albumin: 4.3 g/dL (ref 3.9–4.9)
Alkaline Phosphatase: 76 IU/L (ref 44–121)
Bilirubin Total: <0.2 mg/dL (ref 0.0–1.2)
Bilirubin, Direct: <0.1 mg/dL (ref 0.00–0.40)
Total Protein: 7.4 g/dL (ref 6.0–8.5)

## 2022-04-02 ENCOUNTER — Encounter: Payer: Self-pay | Admitting: Nurse Practitioner

## 2022-04-04 ENCOUNTER — Other Ambulatory Visit: Payer: Medicare Other

## 2022-04-05 ENCOUNTER — Encounter: Payer: Self-pay | Admitting: Nurse Practitioner

## 2022-04-05 ENCOUNTER — Telehealth: Payer: Self-pay

## 2022-04-05 DIAGNOSIS — D509 Iron deficiency anemia, unspecified: Secondary | ICD-10-CM

## 2022-04-05 NOTE — Telephone Encounter (Signed)
MyChart message sent to patient with lab reminder. Lab orders in epic.  

## 2022-04-05 NOTE — Telephone Encounter (Signed)
-----   Message from Yevette Edwards, RN sent at 03/14/2022  1:44 PM EST ----- Regarding: Labs CBC and IBC + Ferritin due - need to enter orders

## 2022-04-05 NOTE — Telephone Encounter (Signed)
MyChart message reviewed by patient. Last read by Hart Carwin at 10:21 AM on 04/05/2022.

## 2022-04-09 ENCOUNTER — Telehealth: Payer: Self-pay

## 2022-04-09 ENCOUNTER — Other Ambulatory Visit: Payer: Self-pay

## 2022-04-09 DIAGNOSIS — R918 Other nonspecific abnormal finding of lung field: Secondary | ICD-10-CM

## 2022-04-09 MED ORDER — GABAPENTIN 300 MG PO CAPS: ORAL_CAPSULE | ORAL | 3 refills | Status: DC

## 2022-04-09 NOTE — Telephone Encounter (Signed)
ATC pt LVMTCB. It appears pt has not had CT completed that Dr. Lamonte Sakai wanted prior to OV. CT order placed and pt asked to call and reschedule OV after CT is completed. Routing to front desk staff to schedule and to Dr. Lamonte Sakai as Juluis Rainier

## 2022-04-09 NOTE — Telephone Encounter (Signed)
Thank you :)

## 2022-04-09 NOTE — Telephone Encounter (Signed)
Please send in Gabapentin 300 MG refill. (Dr. Ranell Patrick is out of the office). Thank you.

## 2022-04-09 NOTE — Telephone Encounter (Signed)
Reviewed last note by Dr. Ranell Patrick

## 2022-04-10 ENCOUNTER — Ambulatory Visit: Payer: Medicare Other | Admitting: Emergency Medicine

## 2022-04-12 ENCOUNTER — Other Ambulatory Visit (INDEPENDENT_AMBULATORY_CARE_PROVIDER_SITE_OTHER): Payer: Medicare Other

## 2022-04-12 DIAGNOSIS — D509 Iron deficiency anemia, unspecified: Secondary | ICD-10-CM | POA: Diagnosis not present

## 2022-04-12 LAB — CBC WITH DIFFERENTIAL/PLATELET
Basophils Absolute: 0.1 10*3/uL (ref 0.0–0.1)
Basophils Relative: 0.6 % (ref 0.0–3.0)
Eosinophils Absolute: 0.2 10*3/uL (ref 0.0–0.7)
Eosinophils Relative: 1.7 % (ref 0.0–5.0)
HCT: 36.7 % (ref 36.0–46.0)
Hemoglobin: 11 g/dL — ABNORMAL LOW (ref 12.0–15.0)
Lymphocytes Relative: 27.4 % (ref 12.0–46.0)
Lymphs Abs: 2.6 10*3/uL (ref 0.7–4.0)
MCHC: 30.1 g/dL (ref 30.0–36.0)
MCV: 76 fl — ABNORMAL LOW (ref 78.0–100.0)
Monocytes Absolute: 0.6 10*3/uL (ref 0.1–1.0)
Monocytes Relative: 6.3 % (ref 3.0–12.0)
Neutro Abs: 6.2 10*3/uL (ref 1.4–7.7)
Neutrophils Relative %: 64 % (ref 43.0–77.0)
Platelets: 293 10*3/uL (ref 150.0–400.0)
RBC: 4.83 Mil/uL (ref 3.87–5.11)
RDW: 29.6 % — ABNORMAL HIGH (ref 11.5–15.5)
WBC: 9.6 10*3/uL (ref 4.0–10.5)

## 2022-04-12 LAB — IBC + FERRITIN
Ferritin: 138.1 ng/mL (ref 10.0–291.0)
Iron: 61 ug/dL (ref 42–145)
Saturation Ratios: 19.8 % — ABNORMAL LOW (ref 20.0–50.0)
TIBC: 308 ug/dL (ref 250.0–450.0)
Transferrin: 220 mg/dL (ref 212.0–360.0)

## 2022-04-18 ENCOUNTER — Telehealth: Payer: Self-pay

## 2022-04-18 NOTE — Telephone Encounter (Signed)
VMT pt reference PREP class starting on 04/30/22. Requested call back to discuss.

## 2022-04-24 NOTE — Progress Notes (Signed)
Attempted to obtain medical history. Unable to reach pt. At this time. HIPAA complaint voicemail left with pre-surgical testing number. 

## 2022-04-26 ENCOUNTER — Encounter: Payer: Self-pay | Admitting: Gastroenterology

## 2022-04-26 ENCOUNTER — Telehealth: Payer: Self-pay

## 2022-04-26 NOTE — Telephone Encounter (Signed)
Called and spoke with patient to confirm ECL procedure appt at [facility] with Dr. Loletha Carrow on Monday, 04/30/22 at 8:30 am. Pt confirmed that she has her prep and instructions. Pt is aware that she will need to arrive at [facility] by 7:00 am with a care partner. Pt verbalized understanding and had no concerns at the end of the call.

## 2022-04-28 NOTE — Anesthesia Preprocedure Evaluation (Signed)
Anesthesia Evaluation  Patient identified by MRN, date of birth, ID band Patient awake    Reviewed: Allergy & Precautions, NPO status , Patient's Chart, lab work & pertinent test results, reviewed documented beta blocker date and time   History of Anesthesia Complications Negative for: history of anesthetic complications  Airway Mallampati: II  TM Distance: >3 FB Neck ROM: Full    Dental no notable dental hx. (+) Dental Advisory Given   Pulmonary sleep apnea , former smoker   Pulmonary exam normal        Cardiovascular hypertension, Pt. on home beta blockers and Pt. on medications Normal cardiovascular exam     Neuro/Psych  Headaches PSYCHIATRIC DISORDERS Anxiety Depression       GI/Hepatic Neg liver ROS,GERD  ,,  Endo/Other  negative endocrine ROS    Renal/GU negative Renal ROS     Musculoskeletal negative musculoskeletal ROS (+)    Abdominal   Peds  Hematology  (+) Blood dyscrasia, anemia   Anesthesia Other Findings   Reproductive/Obstetrics                             Anesthesia Physical Anesthesia Plan  ASA: 3  Anesthesia Plan: MAC   Post-op Pain Management: Minimal or no pain anticipated   Induction:   PONV Risk Score and Plan: 2 and Ondansetron and Propofol infusion  Airway Management Planned: Nasal CPAP and Simple Face Mask  Additional Equipment:   Intra-op Plan:   Post-operative Plan:   Informed Consent: I have reviewed the patients History and Physical, chart, labs and discussed the procedure including the risks, benefits and alternatives for the proposed anesthesia with the patient or authorized representative who has indicated his/her understanding and acceptance.     Dental advisory given  Plan Discussed with: Anesthesiologist and CRNA  Anesthesia Plan Comments: (MAC with Supernova)       Anesthesia Quick Evaluation

## 2022-04-30 ENCOUNTER — Ambulatory Visit (HOSPITAL_COMMUNITY): Payer: Medicare Other | Admitting: Anesthesiology

## 2022-04-30 ENCOUNTER — Ambulatory Visit: Payer: Medicare Other | Admitting: Adult Health

## 2022-04-30 ENCOUNTER — Encounter (HOSPITAL_COMMUNITY)
Admission: RE | Disposition: A | Discharge status: Home / Self Care | Payer: Self-pay | Source: Home / Self Care | Attending: Gastroenterology

## 2022-04-30 ENCOUNTER — Ambulatory Visit (HOSPITAL_BASED_OUTPATIENT_CLINIC_OR_DEPARTMENT_OTHER): Payer: Medicare Other | Admitting: Anesthesiology

## 2022-04-30 ENCOUNTER — Encounter (HOSPITAL_COMMUNITY): Payer: Self-pay | Admitting: Gastroenterology

## 2022-04-30 ENCOUNTER — Ambulatory Visit (HOSPITAL_COMMUNITY)
Admission: RE | Admit: 2022-04-30 | Discharge: 2022-04-30 | Disposition: A | Discharge status: Home / Self Care | Payer: Medicare Other | Attending: Gastroenterology | Admitting: Gastroenterology

## 2022-04-30 DIAGNOSIS — Q438 Other specified congenital malformations of intestine: Secondary | ICD-10-CM | POA: Diagnosis not present

## 2022-04-30 DIAGNOSIS — Z791 Long term (current) use of non-steroidal anti-inflammatories (NSAID): Secondary | ICD-10-CM | POA: Insufficient documentation

## 2022-04-30 DIAGNOSIS — K319 Disease of stomach and duodenum, unspecified: Secondary | ICD-10-CM | POA: Diagnosis not present

## 2022-04-30 DIAGNOSIS — K259 Gastric ulcer, unspecified as acute or chronic, without hemorrhage or perforation: Secondary | ICD-10-CM | POA: Insufficient documentation

## 2022-04-30 DIAGNOSIS — K5730 Diverticulosis of large intestine without perforation or abscess without bleeding: Secondary | ICD-10-CM | POA: Diagnosis not present

## 2022-04-30 DIAGNOSIS — M7918 Myalgia, other site: Secondary | ICD-10-CM | POA: Insufficient documentation

## 2022-04-30 DIAGNOSIS — I10 Essential (primary) hypertension: Secondary | ICD-10-CM | POA: Diagnosis not present

## 2022-04-30 DIAGNOSIS — K2950 Unspecified chronic gastritis without bleeding: Secondary | ICD-10-CM | POA: Diagnosis not present

## 2022-04-30 DIAGNOSIS — K5909 Other constipation: Secondary | ICD-10-CM | POA: Diagnosis not present

## 2022-04-30 DIAGNOSIS — K219 Gastro-esophageal reflux disease without esophagitis: Secondary | ICD-10-CM | POA: Diagnosis not present

## 2022-04-30 DIAGNOSIS — D500 Iron deficiency anemia secondary to blood loss (chronic): Secondary | ICD-10-CM

## 2022-04-30 DIAGNOSIS — G4730 Sleep apnea, unspecified: Secondary | ICD-10-CM | POA: Insufficient documentation

## 2022-04-30 DIAGNOSIS — D509 Iron deficiency anemia, unspecified: Secondary | ICD-10-CM

## 2022-04-30 DIAGNOSIS — K3189 Other diseases of stomach and duodenum: Secondary | ICD-10-CM | POA: Diagnosis not present

## 2022-04-30 DIAGNOSIS — K31819 Angiodysplasia of stomach and duodenum without bleeding: Secondary | ICD-10-CM

## 2022-04-30 DIAGNOSIS — Z87891 Personal history of nicotine dependence: Secondary | ICD-10-CM | POA: Insufficient documentation

## 2022-04-30 DIAGNOSIS — K257 Chronic gastric ulcer without hemorrhage or perforation: Secondary | ICD-10-CM | POA: Diagnosis not present

## 2022-04-30 HISTORY — PX: HOT HEMOSTASIS: SHX5433

## 2022-04-30 HISTORY — PX: BIOPSY: SHX5522

## 2022-04-30 HISTORY — PX: COLONOSCOPY WITH PROPOFOL: SHX5780

## 2022-04-30 HISTORY — PX: ESOPHAGOGASTRODUODENOSCOPY (EGD) WITH PROPOFOL: SHX5813

## 2022-04-30 SURGERY — COLONOSCOPY WITH PROPOFOL
Anesthesia: Monitor Anesthesia Care

## 2022-04-30 MED ORDER — PROPOFOL 1000 MG/100ML IV EMUL
INTRAVENOUS | Status: AC
  Filled 2022-04-30: qty 100

## 2022-04-30 MED ORDER — PROPOFOL 10 MG/ML IV BOLUS
INTRAVENOUS | Status: DC | PRN
  Administered 2022-04-30 (×3): 20 mg via INTRAVENOUS

## 2022-04-30 MED ORDER — LIDOCAINE 2% (20 MG/ML) 5 ML SYRINGE
INTRAMUSCULAR | Status: DC | PRN
  Administered 2022-04-30: 100 mg via INTRAVENOUS

## 2022-04-30 MED ORDER — LACTATED RINGERS IV SOLN: INTRAVENOUS | Status: DC

## 2022-04-30 MED ORDER — SODIUM CHLORIDE 0.9 % IV SOLN: INTRAVENOUS | Status: DC

## 2022-04-30 MED ORDER — PROPOFOL 500 MG/50ML IV EMUL
INTRAVENOUS | Status: DC | PRN
  Administered 2022-04-30: 125 ug/kg/min via INTRAVENOUS

## 2022-04-30 SURGICAL SUPPLY — 25 items

## 2022-04-30 NOTE — Anesthesia Postprocedure Evaluation (Signed)
Anesthesia Post Note  Patient: Ashley Lindsey  Procedure(s) Performed: COLONOSCOPY WITH PROPOFOL ESOPHAGOGASTRODUODENOSCOPY (EGD) WITH PROPOFOL BIOPSY HOT HEMOSTASIS (ARGON PLASMA COAGULATION/BICAP)     Patient location during evaluation: Endoscopy Anesthesia Type: MAC Level of consciousness: awake and alert Pain management: pain level controlled Vital Signs Assessment: post-procedure vital signs reviewed and stable Respiratory status: spontaneous breathing, nonlabored ventilation, respiratory function stable and patient connected to nasal cannula oxygen Cardiovascular status: blood pressure returned to baseline and stable Postop Assessment: no apparent nausea or vomiting Anesthetic complications: no   No notable events documented.  Last Vitals:  Vitals:   04/30/22 0930 04/30/22 0940  BP: (!) 144/81 129/74  Pulse: 88 84  Resp: (!) 22 20  Temp:    SpO2: 100% 95%    Last Pain:  Vitals:   04/30/22 0940  TempSrc:   PainSc: 7                  Lawsen Arnott DANIEL

## 2022-04-30 NOTE — Discharge Instructions (Signed)
YOU HAD AN ENDOSCOPIC PROCEDURE TODAY: Refer to the procedure report and other information in the discharge instructions given to you for any specific questions about what was found during the examination. If this information does not answer your questions, please call [place] office at [phone] to clarify.   YOU SHOULD EXPECT: Some feelings of bloating in the abdomen. Passage of more gas than usual. Walking can help get rid of the air that was put into your GI tract during the procedure and reduce the bloating. If you had a lower endoscopy (such as a colonoscopy or flexible sigmoidoscopy) you may notice spotting of blood in your stool or on the toilet paper. Some abdominal soreness may be present for a day or two, also.  DIET: Your first meal following the procedure should be a light meal and then it is ok to progress to your normal diet. A half-sandwich or bowl of soup is an example of a good first meal. Heavy or fried foods are harder to digest and may make you feel nauseous or bloated. Drink plenty of fluids but you should avoid alcoholic beverages for 24 hours. If you had a esophageal dilation, please see attached instructions for diet.    ACTIVITY: Your care partner should take you home directly after the procedure. You should plan to take it easy, moving slowly for the rest of the day. You can resume normal activity the day after the procedure however YOU SHOULD NOT DRIVE, use power tools, machinery or perform tasks that involve climbing or major physical exertion for 24 hours (because of the sedation medicines used during the test).   SYMPTOMS TO REPORT IMMEDIATELY: A gastroenterologist can be reached at any hour. Please call [phone]  for any of the following symptoms:  Following lower endoscopy (colonoscopy, flexible sigmoidoscopy) Excessive amounts of blood in the stool  Significant tenderness, worsening of abdominal pains  Swelling of the abdomen that is new, acute  Fever of 100 or  higher  Following upper endoscopy (EGD, EUS, ERCP, esophageal dilation) Vomiting of blood or coffee ground material  New, significant abdominal pain  New, significant chest pain or pain under the shoulder blades  Painful or persistently difficult swallowing  New shortness of breath  Black, tarry-looking or red, bloody stools  FOLLOW UP:  If any biopsies were taken you will be contacted by phone or by letter within the next 1-3 weeks. Call [phone]  if you have not heard about the biopsies in 3 weeks.  Please also call with any specific questions about appointments or follow up tests.YOU HAD AN ENDOSCOPIC PROCEDURE TODAY: Refer to the procedure report and other information in the discharge instructions given to you for any specific questions about what was found during the examination. If this information does not answer your questions, please call [place] office at [phone] to clarify.   YOU SHOULD EXPECT: Some feelings of bloating in the abdomen. Passage of more gas than usual. Walking can help get rid of the air that was put into your GI tract during the procedure and reduce the bloating. If you had a lower endoscopy (such as a colonoscopy or flexible sigmoidoscopy) you may notice spotting of blood in your stool or on the toilet paper. Some abdominal soreness may be present for a day or two, also.  DIET: Your first meal following the procedure should be a light meal and then it is ok to progress to your normal diet. A half-sandwich or bowl of soup is an example of a   good first meal. Heavy or fried foods are harder to digest and may make you feel nauseous or bloated. Drink plenty of fluids but you should avoid alcoholic beverages for 24 hours. If you had a esophageal dilation, please see attached instructions for diet.    ACTIVITY: Your care partner should take you home directly after the procedure. You should plan to take it easy, moving slowly for the rest of the day. You can resume  normal activity the day after the procedure however YOU SHOULD NOT DRIVE, use power tools, machinery or perform tasks that involve climbing or major physical exertion for 24 hours (because of the sedation medicines used during the test).   SYMPTOMS TO REPORT IMMEDIATELY: A gastroenterologist can be reached at any hour. Please call [phone]  for any of the following symptoms:  Following lower endoscopy (colonoscopy, flexible sigmoidoscopy) Excessive amounts of blood in the stool  Significant tenderness, worsening of abdominal pains  Swelling of the abdomen that is new, acute  Fever of 100 or higher  Following upper endoscopy (EGD, EUS, ERCP, esophageal dilation) Vomiting of blood or coffee ground material  New, significant abdominal pain  New, significant chest pain or pain under the shoulder blades  Painful or persistently difficult swallowing  New shortness of breath  Black, tarry-looking or red, bloody stools  FOLLOW UP:  If any biopsies were taken you will be contacted by phone or by letter within the next 1-3 weeks. Call [phone]  if you have not heard about the biopsies in 3 weeks.  Please also call with any specific questions about appointments or follow up tests. 

## 2022-04-30 NOTE — H&P (Signed)
History and Physical:  This patient presents for endoscopic testing for:  Epigastric pain and iron deficiency anemia.  Clinical details in office consult note dated 03/06/2022.  Noted in there is that patient had no polyps found on a screening colonoscopy with Dr. Penne Lash in January 2017.  Chronic NSAID use for musculoskeletal pain. Chronic constipation worsened by oral iron. Patient subsequently had IV iron with improvement in hemoglobin. She reports constipation persists despite discontinuation of oral iron, nausea has improved, epigastric pain has subsided.  She is still using NSAIDs but has tried to cut down her intake of Excedrin. Patient is otherwise without complaints or active issues today.   Past Medical History: Past Medical History:  Diagnosis Date   Abdominal cramps    Anxiety    Arthritis    Cancer ([place])    s/p radiation- last dose 6/12//has been off Tamoxifen 8/12   Chronic headaches    Costochondritis    Fatigue    Fibromyalgia 03/06/2013   GERD (gastroesophageal reflux disease)    History of COVID-19    Hypertension    Lymphedema    RIGHT ARM-////  STATES USE LEFT ARM FOR BP'S   Migraines    Muscle spasms of head and/or neck    following to the breast   Passed out    4th of july   Personal history of radiation therapy 2010   lt breast    S/P radiation therapy 08/19/07 - 10/10/07   Left Breast/5040 cGy/28 fractions with Boost for a toatl dose of 6300 dGy   S/P radiation therapy 08/14/10 -10/03/10   right breast   Sleep apnea    STOP BANG SCORE 5     Past Surgical History: Past Surgical History:  Procedure Laterality Date   ABDOMINAL HYSTERECTOMY     with left salpingooophorectomy   BREAST LUMPECTOMY  2009/2012   LEFT/RIGHT with lymph node dissection   KNEE ARTHROSCOPY     right   OOPHORECTOMY  2013    Allergies: Allergies  Allergen Reactions   Aspirin     unknown   Penicillins Hives and Swelling    Has patient had a PCN reaction causing  immediate rash, facial/tongue/throat swelling, SOB or lightheadedness with hypotension: Yes Has patient had a PCN reaction causing severe rash involving mucus membranes or skin necrosis: Yes Has patient had a PCN reaction that required hospitalization Yes Has patient had a PCN reaction occurring within the last 10 years: No If all of the above answers are "NO", then may proceed with Cephalosporin use.     Outpatient Meds: Current Facility-Administered Medications  Medication Dose Route Frequency Provider Last Rate Last Admin   0.9 %  sodium chloride infusion   Intravenous Continuous Danis, Estill Cotta III, MD       lactated ringers infusion   Intravenous Continuous Mansouraty, Telford Nab., MD 50 mL/hr at 04/30/22 0749 New Bag at 04/30/22 0749      ___________________________________________________________________ Objective   Exam:  BP (!) 143/54   Pulse 86   Temp (!) 97.1 F (36.2 C) (Temporal)   Resp 16   Ht '5\' 2"'$  (1.575 m)   Wt 121.1 kg   SpO2 96%   BMI 48.83 kg/m   CV: regular , S1/S2 Resp: clear to auscultation bilaterally, normal RR and effort noted GI: soft, no tenderness, with active bowel sounds.   Assessment: Epigastric pain Chronic constipation Iron deficiency anemia   Plan: Colonoscopy EGD  The benefits and risks of the planned procedure were  described in detail with the patient or (when appropriate) their health care proxy.  Risks were outlined as including, but not limited to, bleeding, infection, perforation, adverse medication reaction leading to cardiac or pulmonary decompensation, pancreatitis (if ERCP).  The limitation of incomplete mucosal visualization was also discussed.  No guarantees or warranties were given.    The patient is appropriate for an endoscopic procedure in the ambulatory setting.   - Wilfrid Lund, MD

## 2022-04-30 NOTE — Op Note (Signed)
[facility] Patient Name: Ashley Lindsey Procedure Date: 04/30/2022 MRN: [id number] Attending MD: Estill Cotta. Loletha Carrow , MD, [id number] Date of Birth: [birth year] CSN: [id number] Age: 62 Admit Type: Outpatient Procedure:                Upper GI endoscopy Indications:              Epigastric abdominal pain, Iron deficiency anemia                            secondary to chronic blood loss Providers:                Mallie Mussel L. Loletha Carrow, MD, Helane Rima, RN, Luan Moore, Technician, Danley Danker, CRNA Referring MD:              Medicines:                Monitored Anesthesia Care Complications:            No immediate complications. Estimated Blood Loss:     Estimated blood loss was minimal. Procedure:                Pre-Anesthesia Assessment:                           - Prior to the procedure, a History and Physical                            was performed, and patient medications and                            allergies were reviewed. The patient's tolerance of                            previous anesthesia was also reviewed. The risks                            and benefits of the procedure and the sedation                            options and risks were discussed with the patient.                            All questions were answered, and informed consent                            was obtained. Prior Anticoagulants: The patient has                            taken no anticoagulant or antiplatelet agents. ASA                            Grade Assessment: III - A patient with severe  systemic disease. After reviewing the risks and                            benefits, the patient was deemed in satisfactory                            condition to undergo the procedure.                           After obtaining informed consent, the endoscope was                            passed under direct vision. Throughout the                             procedure, the patient's blood pressure, pulse, and                            oxygen saturations were monitored continuously. The                            [id number]-H190 ([id number]) Olympus endoscope was introduced                            through the mouth, and advanced to the second part                            of duodenum. The upper GI endoscopy was somewhat                            difficult due to coughing and respiratory motion.                            The patient tolerated the procedure fairly well. Scope In: Scope Out: Findings:      The larynx was normal.      The esophagus was normal.      A single 3 mm angioectasia with no bleeding was found on the greater       curvature of the gastric body. Coagulation for hemostasis using argon       plasma at 1 liter/minute and 20 watts was successful.      One non-bleeding linear and superficial gastric ulcer with no stigmata       of bleeding was found in the prepyloric region of the stomach. The       lesion was 8 mm in largest dimension. Several biopsies were obtained in       the gastric body and in the gastric antrum with cold forceps for       histology.      The exam of the stomach was otherwise normal.      The cardia and gastric fundus were normal on retroflexion.      The examined duodenum was normal. Impression:               - Normal larynx.                           -  Normal esophagus.                           - A single non-bleeding angioectasia in the                            stomach. Treated with argon plasma coagulation                            (APC).                           - Non-bleeding gastric ulcer with no stigmata of                            bleeding.                           - Normal examined duodenum.                           - Several biopsies were obtained in the gastric                            body and in the gastric antrum. Moderate Sedation:      MAC sedation  used Recommendation:           - Patient has a contact number available for                            emergencies. The signs and symptoms of potential                            delayed complications were discussed with the                            patient. Return to normal activities tomorrow.                            Written discharge instructions were provided to the                            patient.                           - Resume previous diet.                           - Use Prilosec (omeprazole) 40 mg PO BID. (this is                            an increase from the current dose of 20 mg once                            daily) Procedure Code(s):        --- Professional ---  43255, 59, Esophagogastroduodenoscopy, flexible,                            transoral; with control of bleeding, any method                           43239, Esophagogastroduodenoscopy, flexible,                            transoral; with biopsy, single or multiple Diagnosis Code(s):        --- Professional ---                           K31.819, Angiodysplasia of stomach and duodenum                            without bleeding                           K25.9, Gastric ulcer, unspecified as acute or                            chronic, without hemorrhage or perforation                           R10.13, Epigastric pain                           D50.0, Iron deficiency anemia secondary to blood                            loss (chronic) CPT copyright 2022 American [employer] Association. All rights reserved. The codes documented in this report are preliminary and upon coder review may  be revised to meet current compliance requirements. Diksha Tagliaferro L. Loletha Carrow, MD 04/30/2022 9:30:33 AM This report has been signed electronically. Number of Addenda: 0

## 2022-04-30 NOTE — Anesthesia Procedure Notes (Signed)
Date/Time: 04/30/2022 8:31 AM  Performed by: Sharlette Dense, CRNAOxygen Delivery Method: Simple face mask

## 2022-04-30 NOTE — Op Note (Signed)
[facility] Patient Name: Ashley Lindsey Procedure Date: 04/30/2022 MRN: [id number] Attending MD: Estill Cotta. Loletha Carrow , MD, [id number] Date of Birth: 05/22/1960 CSN: [id number] Age: 62 Admit Type: Outpatient Procedure:                Colonoscopy Indications:              Iron deficiency anemia secondary to chronic blood                            loss                           (no polyps Jan 2017 colonoscopy at outside practice) Providers:                Estill Cotta. Loletha Carrow, MD, Helane Rima, RN, Luan Moore, Technician, Danley Danker, CRNA Referring MD:              Medicines:                Monitored Anesthesia Care Complications:            No immediate complications. Estimated Blood Loss:     Estimated blood loss: none. Procedure:                Pre-Anesthesia Assessment:                           - Prior to the procedure, a History and Physical                            was performed, and patient medications and                            allergies were reviewed. The patient's tolerance of                            previous anesthesia was also reviewed. The risks                            and benefits of the procedure and the sedation                            options and risks were discussed with the patient.                            All questions were answered, and informed consent                            was obtained. Prior Anticoagulants: The patient has                            taken no anticoagulant or antiplatelet agents. ASA  Grade Assessment: III - A patient with severe                            systemic disease. After reviewing the risks and                            benefits, the patient was deemed in satisfactory                            condition to undergo the procedure.                           After obtaining informed consent, the colonoscope                            was passed  under direct vision. Throughout the                            procedure, the patient's blood pressure, pulse, and                            oxygen saturations were monitored continuously. The                            CF-HQ190L ([id number]) Olympus colonoscope was                            introduced through the anus and advanced to the the                            cecum, identified by appendiceal orifice and                            ileocecal valve. The colonoscopy was performed with                            difficulty due to a redundant colon, significant                            looping and the patient's body habitus. Successful                            completion of the procedure was aided by changing                            the patient's position (semi-prone, semi-supine),                            using manual pressure and straightening and                            shortening the scope to obtain bowel loop  reduction. The patient tolerated the procedure                            fairly well. The quality of the bowel preparation                            was generally good, with scattered fibrous debirs                            that could not be completely cleared. The ileocecal                            valve, appendiceal orifice, and rectum were                            photographed. TI could not be intubated due to                            redundant scope anatomy and looping. Scope In: 8:51:27 AM Scope Out: 9:17:06 AM Scope Withdrawal Time: 0 hours 10 minutes 2 seconds  Total Procedure Duration: 0 hours 25 minutes 39 seconds  Findings:      The perianal and digital rectal examinations were normal.      The colon (entire examined portion but especially left) was       significantly redundant.      Repeat examination of right colon under NBI performed.      A few diverticula were found in the sigmoid colon.      The exam was  otherwise without abnormality on direct and retroflexion       views. Impression:               - Redundant colon.                           - Diverticulosis in the sigmoid colon.                           - The examination was otherwise normal on direct                            and retroflexion views.                           - No specimens collected. Moderate Sedation:      MAC sedation used Recommendation:           - Patient has a contact number available for                            emergencies. The signs and symptoms of potential                            delayed complications were discussed with the                            patient. Return to normal activities tomorrow.  Written discharge instructions were provided to the                            patient.                           - Resume previous diet.                           - Continue present medications.                           - Repeat colonoscopy in 5 years for screening                            purposes (see prep quality noted above).                           - See the other procedure note for documentation of                            additional recommendations. Procedure Code(s):        --- Professional ---                           [phone], Colonoscopy, flexible; diagnostic, including                            collection of specimen(s) by brushing or washing,                            when performed (separate procedure) Diagnosis Code(s):        --- Professional ---                           D50.0, Iron deficiency anemia secondary to blood                            loss (chronic)                           K57.30, Diverticulosis of large intestine without                            perforation or abscess without bleeding                           Q43.8, Other specified congenital malformations of                            intestine CPT copyright 2022 American Medical  Association. All rights reserved. The codes documented in this report are preliminary and upon coder review may  be revised to meet current compliance requirements. [name] L. Loletha Carrow, MD 04/30/2022 9:36:54 AM This report has been signed electronically. Number of Addenda: 0

## 2022-04-30 NOTE — Interval H&P Note (Signed)
History and Physical Interval Note:  04/30/2022 8:24 AM  Ashley Lindsey  has presented today for surgery, with the diagnosis of IDA, elevated BMI.  The various methods of treatment have been discussed with the patient and family. After consideration of risks, benefits and other options for treatment, the patient has consented to  Procedure(s): COLONOSCOPY WITH PROPOFOL (N/A) ESOPHAGOGASTRODUODENOSCOPY (EGD) WITH PROPOFOL (N/A) as a surgical intervention.  The patient's history has been reviewed, patient examined, no change in status, stable for surgery.  I have reviewed the patient's chart and labs.  Questions were answered to the patient's satisfaction.     Nelida Meuse III

## 2022-04-30 NOTE — Transfer of Care (Signed)
Immediate Anesthesia Transfer of Care Note  Patient: Ashley Lindsey  Procedure(s) Performed: COLONOSCOPY WITH PROPOFOL ESOPHAGOGASTRODUODENOSCOPY (EGD) WITH PROPOFOL  Patient Location: PACU  Anesthesia Type:MAC  Level of Consciousness: awake, alert , and oriented  Airway & Oxygen Therapy: Patient Spontanous Breathing and Patient connected to face mask oxygen  Post-op Assessment: Report given to RN, Post -op Vital signs reviewed and stable, and Patient moving all extremities X 4  Post vital signs: Reviewed and stable  Last Vitals:  Vitals Value Taken Time  BP 145/89   Temp    Pulse 84 04/30/22 0922  Resp 12   SpO2 100 % 04/30/22 0922  Vitals shown include unvalidated device data.  Last Pain:  Vitals:   04/30/22 0743  TempSrc: Temporal  PainSc:          Complications: No notable events documented.

## 2022-05-01 ENCOUNTER — Encounter: Payer: Self-pay | Admitting: Gastroenterology

## 2022-05-01 ENCOUNTER — Telehealth: Payer: Self-pay

## 2022-05-01 ENCOUNTER — Encounter: Payer: Self-pay | Admitting: Emergency Medicine

## 2022-05-01 ENCOUNTER — Other Ambulatory Visit: Payer: Self-pay | Admitting: Nurse Practitioner

## 2022-05-01 NOTE — Telephone Encounter (Signed)
Received message from pt requesting call back.  Call to pt. Clarified class times needed.  Could do MW 230p-345p or TTH 6p-715p Both will likely begin early Feb.  Will call her back when starting classes and see which she prefers and schedule intake then.

## 2022-05-02 ENCOUNTER — Telehealth: Payer: Self-pay

## 2022-05-02 DIAGNOSIS — R1013 Epigastric pain: Secondary | ICD-10-CM

## 2022-05-02 DIAGNOSIS — R1084 Generalized abdominal pain: Secondary | ICD-10-CM

## 2022-05-02 DIAGNOSIS — D509 Iron deficiency anemia, unspecified: Secondary | ICD-10-CM

## 2022-05-02 LAB — SURGICAL PATHOLOGY

## 2022-05-02 NOTE — Telephone Encounter (Signed)
That was my misunderstanding, since her medication list seems to have both Dexilant and omeprazole on it.  Dexilant 60 mg once daily is sufficient.  Incidentally, her stomach biopsies have returned and they are negative for H. pylori bacteria.  As we discussed after her procedure, the ulcer to completely heal she needs to decrease intake of Excedrin and other NSAIDs while speaking with primary care about alternative pain control medicines.  Please arrange a CBC and iron studies by the end of next week.   - HD

## 2022-05-02 NOTE — Telephone Encounter (Signed)
Patient contacted. She states that she is on Dexilant 60 mg once a day and was told to double up on her omeprazole while in hospital. She wants to clarify her dosing for the PPI, should she continue the [place] or start the omperazole. Please advise.

## 2022-05-03 ENCOUNTER — Encounter (HOSPITAL_COMMUNITY): Payer: Self-pay | Admitting: Gastroenterology

## 2022-05-03 NOTE — Telephone Encounter (Signed)
Patient has been notified and aware to take the Dexilant 60 mg daily. And not use the omeprazole. She agrees to have the lab work completed next week. Patient will discuss alternative treatment other than Mobic.

## 2022-05-07 ENCOUNTER — Other Ambulatory Visit: Payer: 59

## 2022-05-10 ENCOUNTER — Other Ambulatory Visit: Payer: Medicare Other

## 2022-05-14 ENCOUNTER — Ambulatory Visit: Payer: Medicare Other | Admitting: Adult Health

## 2022-05-16 ENCOUNTER — Ambulatory Visit: Payer: Medicare Other | Admitting: Emergency Medicine

## 2022-05-21 ENCOUNTER — Ambulatory Visit (INDEPENDENT_AMBULATORY_CARE_PROVIDER_SITE_OTHER): Payer: 59

## 2022-05-21 ENCOUNTER — Ambulatory Visit (INDEPENDENT_AMBULATORY_CARE_PROVIDER_SITE_OTHER): Payer: 59 | Admitting: Physician Assistant

## 2022-05-21 ENCOUNTER — Encounter: Payer: Self-pay | Admitting: Physical Medicine and Rehabilitation

## 2022-05-21 ENCOUNTER — Encounter: Payer: Self-pay | Admitting: Physician Assistant

## 2022-05-21 DIAGNOSIS — M25512 Pain in left shoulder: Secondary | ICD-10-CM

## 2022-05-21 NOTE — Progress Notes (Signed)
Office Visit Note   Patient: Ashley Lindsey           Date of Birth: [birth year]           MRN: [id number] Visit Date: 05/21/2022              Requested by: [name], [address] [place],  [place] [address] PCP: [name], FNP  Chief Complaint  Patient presents with   Left Leg - Pain   Left Arm - Pain      HPI: Ashley Lindsey is a pleasant 62 year old woman who is a former patient of Dr. Durward Fortes.  She has a history of osteoarthritis especially in her left knee.  Unfortunately her BMI is too high for surgery.  She though has lost about 75 pounds and she knows she has to lose about 100.  She comes in today asking if perhaps she could get another injection into her knee.  She is also complaining of nonfocal pain in her volar left forearm and her left hand and in her left shoulder.  Denies any particular neck pain.  Thinks she "slept on it wrong.  She does have a history of mastectomies and fibromyalgia.  Assessment & Plan: Visit Diagnoses:  1. Acute pain of left shoulder     Plan: With regards to her knee I did ask if she could wait at least a couple more weeks and she is willing to do this.  Her arm pain is a little bit more difficult to define.  She does have a history of fibromyalgia.  I do not see any redness erythema or significant of swelling compared to the other side.  She has had no ecchymosis.  She is got a strong radial pulse.  She is focally tender even to light palpation in the volar surface of the left forearm.  Also tenderness in her fingers.  They also tender over the [facility] joint.  I would consider giving her Depo-Medrol Dosepak however she has just been diagnosed with a peptic ulcer.  She does think she like to try and get a compression sleeve for her arm that she has ordered with a glove.  This may be a good way to start.  We also talked about topical CBD oil and Voltaren gel.  If he gets worse should come in right away.  Also reevaluate her in 2 weeks.   This is now been going on about 2 weeks total  Follow-Up Instructions: No follow-ups on file.   Ortho Exam  Patient is alert, oriented, no adenopathy, well-dressed, normal affect, normal respiratory effort. Neck she has 37-year-old forward flexion a little stiffness on extension and side-to-side turning.  She has good strength with biceps extension and flexion though it does cause her quite a bit of pain.  She is tender even to light palpation over her volar forearm..  Her radial pulses are intact she has brisk capillary refill she is able to oppose all of her fingers.  Negative Tinel's sign.  She has forward elevation of her arm with some pain in her shoulder.  Internal rotation behind her back is fairly good.  No significant swelling all of her compartments are soft  Imaging: XR Cervical Spine 2 or 3 views  Result Date: 05/21/2022 2 views of her cervical spine were obtained today.  She does have degenerative changes with loss of joint space and sclerotic changes.  No acute fractures  XR Shoulder Left  Result Date: 05/21/2022 Radiographs of  her left shoulder were obtained today.  Overall well-maintained joint spacing.  No evidence of fracture.  She does have some hypertrophic bone around the [facility] joint.  No images are attached to the encounter.  Labs: Lab Results  Component Value Date   HGBA1C 5.8 (H) 03/05/2022   HGBA1C 5.9 (H) 10/16/2021   HGBA1C 6.6 (H) 02/08/2021   ESRSEDRATE 48 (H) 08/26/2019   LABURIC 4.8 08/26/2019   REPTSTATUS 06/12/2017 FINAL 06/10/2017   CULT >=100,000 COLONIES/mL ESCHERICHIA COLI (A) 06/10/2017   LABORGA ESCHERICHIA COLI (A) 06/10/2017     Lab Results  Component Value Date   ALBUMIN 4.3 03/29/2022   ALBUMIN 4.2 03/05/2022   ALBUMIN 4.6 02/08/2021    Lab Results  Component Value Date   MG 1.9 01/16/2022   MG 2.0 02/17/2020   Lab Results  Component Value Date   VD25OH 41.6 01/16/2022   VD25OH 32 12/26/2011   VD25OH 55 06/23/2010    No  results found for: "PREALBUMIN"    Latest Ref Rng & Units 04/12/2022    4:13 PM 03/05/2022   11:10 AM 04/06/2021    2:48 AM  CBC EXTENDED  WBC 4.0 - 10.5 K/uL 9.6  10.0  12.0   RBC 3.87 - 5.11 Mil/uL 4.83  4.26  4.05   Hemoglobin 12.0 - 15.0 g/dL 11.0  8.2  9.1   HCT 36.0 - 46.0 % 36.7  29.2  31.8   Platelets 150.0 - 400.0 K/uL 293.0  259  361   NEUT# 1.4 - 7.7 K/uL 6.2   8.4   Lymph# 0.7 - 4.0 K/uL 2.6   2.6      There is no height or weight on file to calculate BMI.  Orders:  Orders Placed This Encounter  Procedures   XR Cervical Spine 2 or 3 views   XR Shoulder Left   No orders of the defined types were placed in this encounter.    Procedures: No procedures performed  Clinical Data: No additional findings.  ROS:  All other systems negative, except as noted in the HPI. Review of Systems  Objective: Vital Signs: There were no vitals taken for this visit.  Specialty Comments:  No specialty comments available.  PMFS History: Patient Active Problem List   Diagnosis Date Noted   Chronic gastric ulcer without hemorrhage and without perforation 04/30/2022   Gastric AVM 04/30/2022   Iron deficiency anemia 03/06/2022   Unilateral primary osteoarthritis, left knee 10/02/2021   Pulmonary nodules 07/12/2021   Chronic cough 07/12/2021   Avulsion fracture of middle phalanx of finger 04/18/2021   Tibial plateau fracture, left 04/18/2021   Asymmetrical sensorineural hearing loss 02/02/2021   Bilateral primary osteoarthritis of knee 02/09/2020   Chronic intermittent hypoxia with obstructive sleep apnea 08/24/2019   Chronic intractable headache 07/14/2019   Morning headache 07/14/2019   Loud snoring 07/14/2019   Super obesity 07/14/2019   Sleeps in sitting position due to orthopnea 07/14/2019   Chest pain due to GERD 07/14/2019   Nocturia more than twice per night 07/14/2019   Unilateral primary osteoarthritis, right knee 05/26/2019   Chronic migraine without aura  without status migrainosus, not intractable 05/13/2019   Class 3 severe obesity due to excess calories without serious comorbidity with body mass index (BMI) of 40.0 to 44.9 in adult [facility]) 08/05/2018   Depression 08/05/2018   Chronic nonintractable headache 08/05/2018   Elevated blood-pressure reading without diagnosis of hypertension 06/06/2018   Chronic pain of right ankle 04/24/2018   Breast  cancer of lower-outer quadrant of right female breast ([place]) 12/02/2014   Bronchospasm 09/21/2014   Dyspnea 09/21/2014   Chronic pain of right knee 09/21/2014   Fibromyalgia 03/06/2013   Dense breasts 03/06/2013   Mastalgia 03/06/2013   Back pain, lumbosacral 03/06/2013   Lymphedema of arm 03/06/2013   Atypical chest pain 01/06/2013   Breast cancer of upper-inner quadrant of left female breast ([place]) 09/08/2012   Family history of malignant neoplasm of breast 09/08/2012   S/P radiation therapy    History of breast cancer in female 08/13/2011   Past Medical History:  Diagnosis Date   Abdominal cramps    Anxiety    Arthritis    Cancer ([place])    s/p radiation- last dose 6/12//has been off Tamoxifen 8/12   Chronic headaches    Costochondritis    Fatigue    Fibromyalgia 03/06/2013   GERD (gastroesophageal reflux disease)    History of COVID-19    Hypertension    Lymphedema    RIGHT ARM-////  STATES USE LEFT ARM FOR BP'S   Migraines    Muscle spasms of head and/or neck    following to the breast   Passed out    4th of july   Personal history of radiation therapy 2010   lt breast    S/P radiation therapy 08/19/07 - 10/10/07   Left Breast/5040 cGy/28 fractions with Boost for a toatl dose of 6300 dGy   S/P radiation therapy 08/14/10 -10/03/10   right breast   Sleep apnea    STOP BANG SCORE 5    Family History  Problem Relation Age of Onset   Breast cancer Mother 47   Heart disease Father    Cancer Maternal Aunt 63       d. from "female cancer" possibly cervical   Breast cancer Maternal  Aunt 60   Prostate cancer Maternal Uncle 78   Prostate cancer Maternal Uncle 80   Prostate cancer Paternal Uncle    Prostate cancer Paternal Uncle    Prostate cancer Maternal Grandfather 3   Breast cancer Cousin 42       mat 1st cousin    Past Surgical History:  Procedure Laterality Date   ABDOMINAL HYSTERECTOMY     with left salpingooophorectomy   BIOPSY  04/30/2022   Procedure: BIOPSY;  Surgeon: Doran Stabler, MD;  Location: [facility];  Service: Gastroenterology;;   BREAST LUMPECTOMY  2009/2012   LEFT/RIGHT with lymph node dissection   COLONOSCOPY WITH PROPOFOL N/A 04/30/2022   Procedure: COLONOSCOPY WITH PROPOFOL;  Surgeon: Doran Stabler, MD;  Location: [facility];  Service: Gastroenterology;  Laterality: N/A;   ESOPHAGOGASTRODUODENOSCOPY (EGD) WITH PROPOFOL N/A 04/30/2022   Procedure: ESOPHAGOGASTRODUODENOSCOPY (EGD) WITH PROPOFOL;  Surgeon: Doran Stabler, MD;  Location: [facility];  Service: Gastroenterology;  Laterality: N/A;   HOT HEMOSTASIS N/A 04/30/2022   Procedure: HOT HEMOSTASIS (ARGON PLASMA COAGULATION/BICAP);  Surgeon: Doran Stabler, MD;  Location: [facility];  Service: Gastroenterology;  Laterality: N/A;   KNEE ARTHROSCOPY     right   OOPHORECTOMY  2013   Social History   Occupational History   Occupation: disability  Tobacco Use   Smoking status: Former    Packs/day: 0.25    Years: 1.00    Total pack years: 0.25    Types: Cigarettes   Smokeless tobacco: Never  Vaping Use   Vaping Use: Never used  Substance and Sexual Activity   Alcohol use: Not Currently  Drug use: No   Sexual activity: Not Currently    Birth control/protection: Surgical

## 2022-05-24 ENCOUNTER — Telehealth: Payer: Self-pay | Admitting: Physician Assistant

## 2022-05-24 ENCOUNTER — Other Ambulatory Visit: Payer: Self-pay | Admitting: Physician Assistant

## 2022-05-24 NOTE — Telephone Encounter (Signed)
Patient wants to know if Velta Addison can call in a muscle relaxer for her.

## 2022-05-25 ENCOUNTER — Telehealth: Payer: Self-pay

## 2022-05-25 NOTE — Telephone Encounter (Signed)
Call to pt reference next PREP class starting on 06/04/22 MW 230p-345p at [facility] she can do that time/date Intake scheduled for 05/28/22 at 4pm. Will meet pt in lobby of [facility]

## 2022-05-30 ENCOUNTER — Other Ambulatory Visit: Payer: Medicare Other

## 2022-05-30 ENCOUNTER — Ambulatory Visit: Payer: Medicare Other | Admitting: Gastroenterology

## 2022-05-30 NOTE — Progress Notes (Deleted)
[facility] Progress Note  Chief Complaint: Abdominal pain, iron deficiency anemia, gastric AVM gastric ulcer  Subjective  History: Summary of GI issues: Initial clinic consultation November 2023 for abdominal pain and iron deficiency anemia setting of chronic Excedrin use. Patient treated with two IV iron treatments afterward January 2023: EGD with nonbleeding gastric AVM ablated with APC, 8 mm linear prepyloric gastric ulcer, gastric biopsies (with IHC) negative for H. pylori. Colonoscopy challenging with markedly redundant colon, though complete to cecum.  Fair prep in some areas, diverticulosis.  ***  ROS: Cardiovascular:  no chest pain Respiratory: no dyspnea  The patient's Past Medical, Family and Social History were reviewed and are on file in the EMR.  Objective:  Med list reviewed  Current Outpatient Medications:    acetaminophen (TYLENOL) 650 MG CR tablet, Take 650 mg by mouth every 8 (eight) hours as needed for pain., Disp: , Rfl:    albuterol (VENTOLIN HFA) 108 (90 Base) MCG/ACT inhaler, INHALE 1-2 PUFFS INTO THE LUNGS EVERY 6 (SIX) HOURS AS NEEDED FOR WHEEZING OR SHORTNESS OF BREATH., Disp: 6.7 each, Rfl: 1   aspirin-acetaminophen-caffeine (EXCEDRIN MIGRAINE) 250-250-65 MG tablet, Take 1 tablet by mouth every 6 (six) hours as needed for headache., Disp: , Rfl:    atorvastatin (LIPITOR) 10 MG tablet, Take 1 tablet (10 mg total) by mouth Nightly., Disp: 30 tablet, Rfl: 3   benzonatate (TESSALON PERLES) 100 MG capsule, Take 1 capsule (100 mg total) by mouth every 6 (six) hours as needed., Disp: 30 capsule, Rfl: 1   Bisacodyl (DULCOLAX PO), Take 1 capsule by mouth daily as needed (constipation). , Disp: , Rfl:    cholecalciferol 5000 units TABS, Take 1 tablet (5,000 Units total) by mouth 2 (two) times daily. (Patient taking differently: Take 5,000 Units by mouth daily.), Disp: , Rfl:    cyclobenzaprine (FLEXERIL) 10 MG tablet, TAKE 1 TABLET BY MOUTH EVERY DAY AS  NEEDED, Disp: 90 tablet, Rfl: 1   dexlansoprazole (DEXILANT) 60 MG capsule, TAKE 1 CAPSULE BY MOUTH EVERY DAY, Disp: 90 capsule, Rfl: 1   diclofenac Sodium (VOLTAREN) 1 % GEL, Apply 2 g topically 4 (four) times daily., Disp: 2 g, Rfl: 3   diphenhydrAMINE (BENADRYL) 25 MG tablet, Take 1 tablet (25 mg total) by mouth every 6 (six) hours as needed for up to 20 doses (migraine). Take with reglan for migraine headache, Disp: 20 tablet, Rfl: 0   ELDERBERRY PO, Take 1 tablet by mouth daily., Disp: , Rfl:    ENTRESTO 24-26 MG, TAKE 1 TABLET BY MOUTH TWICE A DAY, Disp: 180 tablet, Rfl: 0   fish oil-omega-3 fatty acids 1000 MG capsule, Take 1 g by mouth daily., Disp: , Rfl:    Flaxseed, Linseed, (FLAXSEED OIL MAX STR PO), Take 1 capsule by mouth daily. , Disp: , Rfl:    gabapentin (NEURONTIN) 300 MG capsule, TAKE 1 CAPSULE BY MOUTH THREE TIMES A DAY, Disp: 90 capsule, Rfl: 3   Magnesium 100 MG TABS, Take 1 tablet by mouth daily. Takes 50 mg, Disp: , Rfl:    meloxicam (MOBIC) 7.5 MG tablet, Take 1 tablet (7.5 mg total) by mouth daily., Disp: 90 tablet, Rfl: 0   mometasone (NASONEX) 50 MCG/ACT nasal spray, Place 2 sprays into the nose daily., Disp: 1 each, Rfl: 2   Multiple Vitamins-Minerals (MULTIVITAMIN & MINERAL PO), Take 1 tablet by mouth daily., Disp: , Rfl:    mupirocin ointment (BACTROBAN) 2 %, Apply 1 Application topically 2 (two) times daily.,  Disp: 22 g, Rfl: 0   nitrofurantoin, macrocrystal-monohydrate, (MACROBID) 100 MG capsule, Take 1 capsule (100 mg total) by mouth 2 (two) times daily., Disp: 10 capsule, Rfl: 0   ondansetron (ZOFRAN) 4 MG tablet, TAKE 1 TABLET BY MOUTH DAILY AS NEEDED FOR NAUSEA OR VOMITING, Disp: 30 tablet, Rfl: 1   OVER THE COUNTER MEDICATION, Take 125 mg by mouth daily. [place]/Simethicone, Disp: , Rfl:    propranolol (INDERAL) 10 MG tablet, TAKE 1 TABLET BY MOUTH TWICE A DAY *HOLD IF SYSTOLIC BP (TOP #) IS <643PIRJ OR PULSE IS <60BPM, Disp: 180 tablet, Rfl: 1   spironolactone  (ALDACTONE) 25 MG tablet, TAKE 1 TABLET BY MOUTH EVERY DAY IN THE MORNING, Disp: 90 tablet, Rfl: 0   SYMBICORT 80-4.5 MCG/ACT inhaler, INHALE 2 PUFFS INTO THE LUNGS IN THE MORNING AND AT BEDTIME., Disp: 30.6 each, Rfl: 1   topiramate (TOPAMAX) 25 MG tablet, Take 1 tablet (25 mg total) by mouth 2 (two) times daily., Disp: 180 tablet, Rfl: 3   triamcinolone cream (KENALOG) 0.1 %, APPLY 1 APPLICATION TOPICALLY 2 (TWO) TIMES DAILY AS NEEDED (RASH)., Disp: 60 g, Rfl: 1   Turmeric 500 MG CAPS, Take by mouth., Disp: , Rfl:    venlafaxine XR (EFFEXOR-XR) 75 MG 24 hr capsule, Total dose 225 mg, Disp: 90 capsule, Rfl: 3   Vitamin D, Ergocalciferol, (DRISDOL) 1.25 MG (50000 UNIT) CAPS capsule, Take 1 capsule (50,000 Units total) by mouth every 7 (seven) days., Disp: 20 capsule, Rfl: 0   vitamin E 200 UNIT capsule, Take 200 Units by mouth daily., Disp: , Rfl:    Vital signs in last 24 hrs: There were no vitals filed for this visit. Wt Readings from Last 3 Encounters:  04/30/22 267 lb (121.1 kg)  03/23/22 266 lb 9.6 oz (120.9 kg)  03/12/22 269 lb 6.4 oz (122.2 kg)    Physical Exam  *** HEENT: sclera anicteric, oral mucosa moist without lesions Neck: supple, no thyromegaly, JVD or lymphadenopathy Cardiac: ***,  no peripheral edema Pulm: clear to auscultation bilaterally, normal RR and effort noted Abdomen: soft, *** tenderness, with active bowel sounds. No guarding or palpable hepatosplenomegaly. Skin; warm and dry, no jaundice or rash  Labs:     Latest Ref Rng & Units 04/12/2022    4:13 PM 03/05/2022   11:10 AM 04/06/2021    2:48 AM  CBC  WBC 4.0 - 10.5 K/uL 9.6  10.0  12.0   Hemoglobin 12.0 - 15.0 g/dL 11.0  8.2  9.1   Hematocrit 36.0 - 46.0 % 36.7  29.2  31.8   Platelets 150.0 - 400.0 K/uL 293.0  259  361    On 04/12/2022, iron 61, TIBC 308, ferritin 138  On 03/05/2022, iron 26, ferritin 11  ___________________________________________ Radiologic  studies:   ____________________________________________ Other:   _____________________________________________ Assessment & Plan  Assessment: No diagnosis found.    Plan: CBC and iron studies  *** minutes were spent on this encounter (including chart review, history/exam, counseling/coordination of care, and documentation) > 50% of that time was spent on counseling and coordination of care.   Ashley Lindsey

## 2022-06-01 ENCOUNTER — Ambulatory Visit: Payer: Medicare Other | Admitting: Adult Health

## 2022-06-04 ENCOUNTER — Telehealth: Payer: Self-pay

## 2022-06-04 ENCOUNTER — Other Ambulatory Visit: Payer: Self-pay | Admitting: Physician Assistant

## 2022-06-04 ENCOUNTER — Other Ambulatory Visit: Payer: Self-pay | Admitting: Hematology and Oncology

## 2022-06-04 MED ORDER — CYCLOBENZAPRINE HCL 10 MG PO TABS: 10.0000 mg | ORAL_TABLET | Freq: Two times a day (BID) | ORAL | 0 refills | Status: DC | PRN

## 2022-06-04 NOTE — Telephone Encounter (Signed)
Patient called triage. She wants to know if you are able to send in something for pain. She has ran out of muscle relaxers. She wants you to call her before. [facility] [phone].

## 2022-06-04 NOTE — Telephone Encounter (Signed)
Attempted to call pt in regards to refill request for Venlafaxine. Per MD last note, pt was relocating to [place]. LVM for pt to return call to confirm.

## 2022-06-05 ENCOUNTER — Encounter: Payer: Self-pay | Admitting: *Deleted

## 2022-06-05 ENCOUNTER — Ambulatory Visit: Payer: 59 | Admitting: Gastroenterology

## 2022-06-05 NOTE — Progress Notes (Signed)
Per pt request RN successfully faxed order for sleeve to a special place [phone]).

## 2022-06-05 NOTE — Progress Notes (Deleted)
[facility] Progress Note  Chief Complaint: ***  Subjective  History: Follow-up for iron deficiency anemia and NSAID induced shallow gastric ulcer discovered on 04/30/2022.  Patient had been decreasing her intake of Excedrin Migraine OTC medicine prior to that.  She received 2 IV iron treatments between the November 2023 office consult and the procedures. Colonoscopy same day January 2024, markedly redundant colon, diverticulosis, no polyps, fair prep in some areas, 5-year recall recommended. ____________________________  ***  ROS: Cardiovascular:  no chest pain Respiratory: no dyspnea  The patient's Past Medical, Family and Social History were reviewed and are on file in the EMR.  Objective:  Med list reviewed  Current Outpatient Medications:    acetaminophen (TYLENOL) 650 MG CR tablet, Take 650 mg by mouth every 8 (eight) hours as needed for pain., Disp: , Rfl:    albuterol (VENTOLIN HFA) 108 (90 Base) MCG/ACT inhaler, INHALE 1-2 PUFFS INTO THE LUNGS EVERY 6 (SIX) HOURS AS NEEDED FOR WHEEZING OR SHORTNESS OF BREATH., Disp: 6.7 each, Rfl: 1   aspirin-acetaminophen-caffeine (EXCEDRIN MIGRAINE) 250-250-65 MG tablet, Take 1 tablet by mouth every 6 (six) hours as needed for headache., Disp: , Rfl:    atorvastatin (LIPITOR) 10 MG tablet, Take 1 tablet (10 mg total) by mouth Nightly., Disp: 30 tablet, Rfl: 3   benzonatate (TESSALON PERLES) 100 MG capsule, Take 1 capsule (100 mg total) by mouth every 6 (six) hours as needed., Disp: 30 capsule, Rfl: 1   Bisacodyl (DULCOLAX PO), Take 1 capsule by mouth daily as needed (constipation). , Disp: , Rfl:    cholecalciferol 5000 units TABS, Take 1 tablet (5,000 Units total) by mouth 2 (two) times daily. (Patient taking differently: Take 5,000 Units by mouth daily.), Disp: , Rfl:    cyclobenzaprine (FLEXERIL) 10 MG tablet, Take 1 tablet (10 mg total) by mouth 2 (two) times daily as needed for muscle spasms., Disp: 30 tablet, Rfl: 0    dexlansoprazole (DEXILANT) 60 MG capsule, TAKE 1 CAPSULE BY MOUTH EVERY DAY, Disp: 90 capsule, Rfl: 1   diclofenac Sodium (VOLTAREN) 1 % GEL, Apply 2 g topically 4 (four) times daily., Disp: 2 g, Rfl: 3   diphenhydrAMINE (BENADRYL) 25 MG tablet, Take 1 tablet (25 mg total) by mouth every 6 (six) hours as needed for up to 20 doses (migraine). Take with reglan for migraine headache, Disp: 20 tablet, Rfl: 0   ELDERBERRY PO, Take 1 tablet by mouth daily., Disp: , Rfl:    ENTRESTO 24-26 MG, TAKE 1 TABLET BY MOUTH TWICE A DAY, Disp: 180 tablet, Rfl: 0   fish oil-omega-3 fatty acids 1000 MG capsule, Take 1 g by mouth daily., Disp: , Rfl:    Flaxseed, Linseed, (FLAXSEED OIL MAX STR PO), Take 1 capsule by mouth daily. , Disp: , Rfl:    gabapentin (NEURONTIN) 300 MG capsule, TAKE 1 CAPSULE BY MOUTH THREE TIMES A DAY, Disp: 90 capsule, Rfl: 3   Magnesium 100 MG TABS, Take 1 tablet by mouth daily. Takes 50 mg, Disp: , Rfl:    meloxicam (MOBIC) 7.5 MG tablet, Take 1 tablet (7.5 mg total) by mouth daily., Disp: 90 tablet, Rfl: 0   mometasone (NASONEX) 50 MCG/ACT nasal spray, Place 2 sprays into the nose daily., Disp: 1 each, Rfl: 2   Multiple Vitamins-Minerals (MULTIVITAMIN & MINERAL PO), Take 1 tablet by mouth daily., Disp: , Rfl:    mupirocin ointment (BACTROBAN) 2 %, Apply 1 Application topically 2 (two) times daily., Disp: 22 g, Rfl: 0  nitrofurantoin, macrocrystal-monohydrate, (MACROBID) 100 MG capsule, Take 1 capsule (100 mg total) by mouth 2 (two) times daily., Disp: 10 capsule, Rfl: 0   ondansetron (ZOFRAN) 4 MG tablet, TAKE 1 TABLET BY MOUTH DAILY AS NEEDED FOR NAUSEA OR VOMITING, Disp: 30 tablet, Rfl: 1   OVER THE COUNTER MEDICATION, Take 125 mg by mouth daily. [place]/Simethicone, Disp: , Rfl:    propranolol (INDERAL) 10 MG tablet, TAKE 1 TABLET BY MOUTH TWICE A DAY *HOLD IF SYSTOLIC BP (TOP #) IS 123456 OR PULSE IS <60BPM, Disp: 180 tablet, Rfl: 1   spironolactone (ALDACTONE) 25 MG tablet, TAKE 1  TABLET BY MOUTH EVERY DAY IN THE MORNING, Disp: 90 tablet, Rfl: 0   SYMBICORT 80-4.5 MCG/ACT inhaler, INHALE 2 PUFFS INTO THE LUNGS IN THE MORNING AND AT BEDTIME., Disp: 30.6 each, Rfl: 1   topiramate (TOPAMAX) 25 MG tablet, Take 1 tablet (25 mg total) by mouth 2 (two) times daily., Disp: 180 tablet, Rfl: 3   triamcinolone cream (KENALOG) 0.1 %, APPLY 1 APPLICATION TOPICALLY 2 (TWO) TIMES DAILY AS NEEDED (RASH)., Disp: 60 g, Rfl: 1   Turmeric 500 MG CAPS, Take by mouth., Disp: , Rfl:    venlafaxine XR (EFFEXOR-XR) 150 MG 24 hr capsule, TAKE 1 CAPSULE BY MOUTH NIGHTLY WITH 75MG CAPSULE FOR A TOTAL OF 225MG NIGHTLY, Disp: 90 capsule, Rfl: 3   venlafaxine XR (EFFEXOR-XR) 75 MG 24 hr capsule, Total dose 225 mg, Disp: 90 capsule, Rfl: 3   Vitamin D, Ergocalciferol, (DRISDOL) 1.25 MG (50000 UNIT) CAPS capsule, Take 1 capsule (50,000 Units total) by mouth every 7 (seven) days., Disp: 20 capsule, Rfl: 0   vitamin E 200 UNIT capsule, Take 200 Units by mouth daily., Disp: , Rfl:    Vital signs in last 24 hrs: There were no vitals filed for this visit. Wt Readings from Last 3 Encounters:  04/30/22 267 lb (121.1 kg)  03/23/22 266 lb 9.6 oz (120.9 kg)  03/12/22 269 lb 6.4 oz (122.2 kg)    Physical Exam  *** HEENT: sclera anicteric, oral mucosa moist without lesions Neck: supple, no thyromegaly, JVD or lymphadenopathy Cardiac: ***,  no peripheral edema Pulm: clear to auscultation bilaterally, normal RR and effort noted Abdomen: soft, *** tenderness, with active bowel sounds. No guarding or palpable hepatosplenomegaly. Skin; warm and dry, no jaundice or rash  Labs:     Latest Ref Rng & Units 04/12/2022    4:13 PM 03/05/2022   11:10 AM 04/06/2021    2:48 AM  CBC  WBC 4.0 - 10.5 K/uL 9.6  10.0  12.0   Hemoglobin 12.0 - 15.0 g/dL 11.0  8.2  9.1   Hematocrit 36.0 - 46.0 % 36.7  29.2  31.8   Platelets 150.0 - 400.0 K/uL 293.0  259  361    Iron/TIBC/Ferritin/ %Sat    Component Value Date/Time    IRON 61 04/12/2022 1613   IRON 26 (L) 03/05/2022 1110   TIBC 308.0 04/12/2022 1613   TIBC 395 03/05/2022 1110   FERRITIN 138.1 04/12/2022 1613   FERRITIN 11 (L) 03/05/2022 1110   IRONPCTSAT 19.8 (L) 04/12/2022 1613   IRONPCTSAT 7 (LL) 03/05/2022 1110    ___________________________________________ Radiologic studies:   ____________________________________________ Other:   _____________________________________________ Assessment & Plan  Assessment: No diagnosis found.    Plan:   *** minutes were spent on this encounter (including chart review, history/exam, counseling/coordination of care, and documentation) > 50% of that time was spent on counseling and coordination of care.   Mallie Mussel  L Danis III

## 2022-06-08 ENCOUNTER — Other Ambulatory Visit: Payer: 59

## 2022-06-09 ENCOUNTER — Other Ambulatory Visit: Payer: Self-pay | Admitting: Nurse Practitioner

## 2022-06-13 ENCOUNTER — Encounter: Payer: Self-pay | Admitting: Gastroenterology

## 2022-06-15 ENCOUNTER — Ambulatory Visit: Payer: 59 | Admitting: Cardiology

## 2022-06-18 ENCOUNTER — Telehealth: Payer: Self-pay

## 2022-06-18 IMAGING — MG DIGITAL DIAGNOSTIC BILAT W/ TOMO W/ CAD
8 series · 8 of 24 positions shown · non-contrast
Comparison: Previous exam(s).

CLINICAL DATA: Patient complaining of intermittent areas of skin
puckering in different areas the right breast. No symptoms today.
Patient has history of bilateral lumpectomies for breast carcinoma,
0334 the left and 1601 on the right.

EXAM:
DIGITAL DIAGNOSTIC BILATERAL MAMMOGRAM WITH TOMOSYNTHESIS AND CAD
TECHNIQUE: Bilateral digital diagnostic mammography and breast tomosynthesis
was performed. The images were evaluated with computer-aided
detection.

[L CC synth-2D]
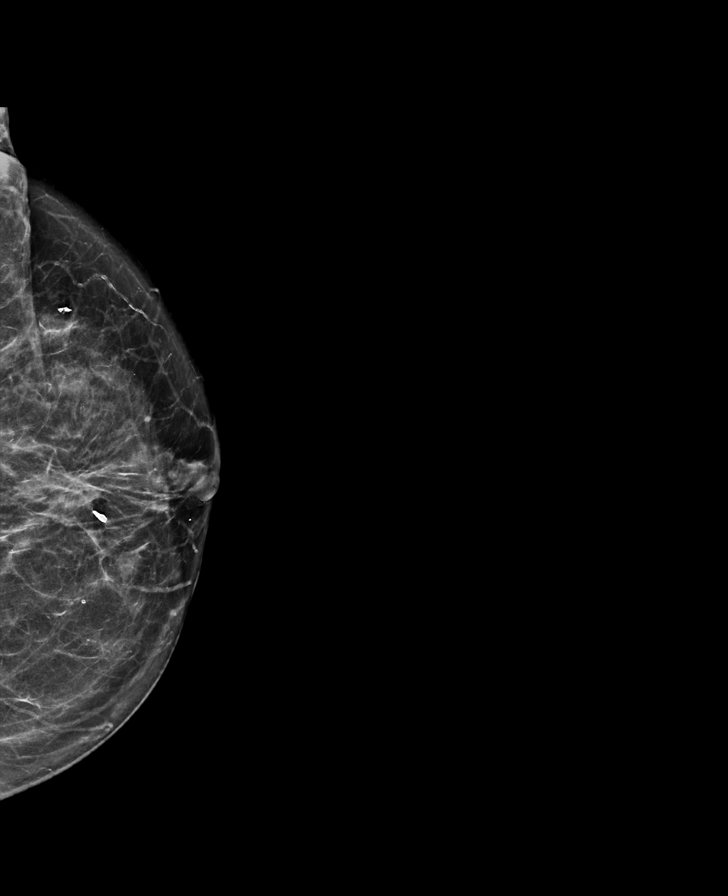

[L MLO synth-2D]
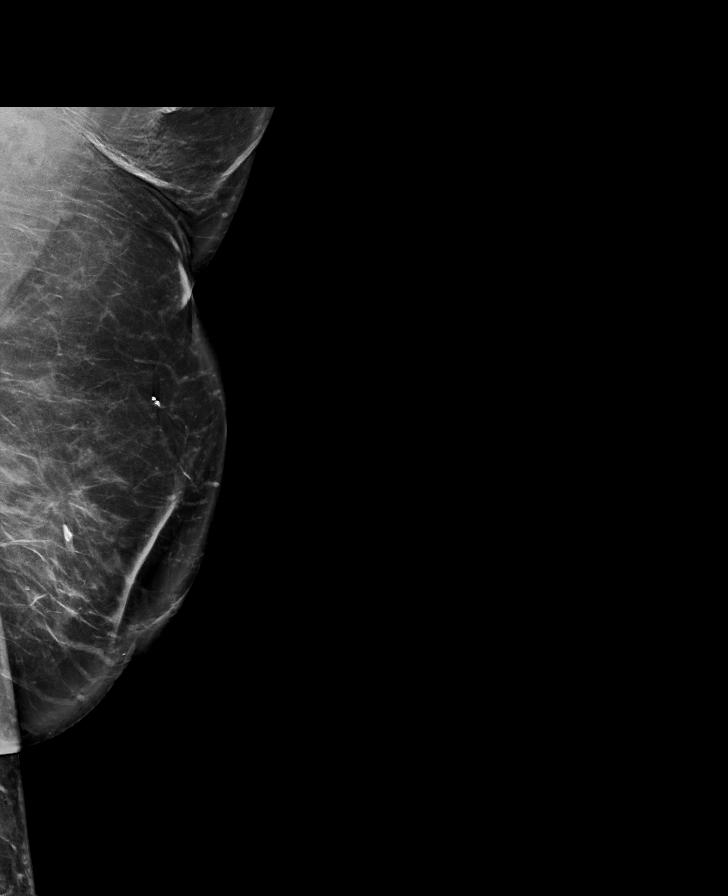

[R MLO synth-2D]
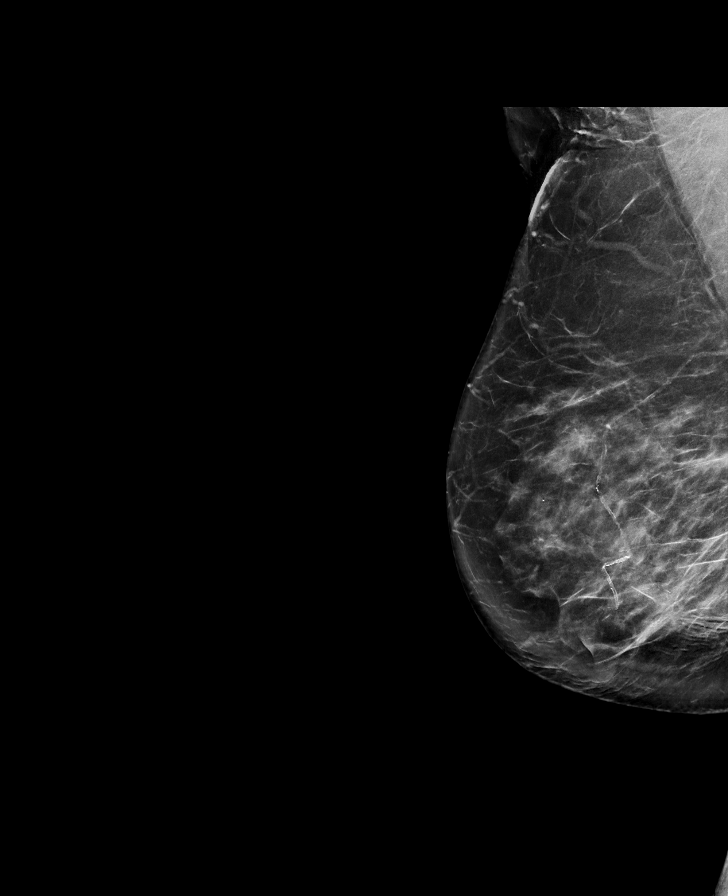

[R CC synth-2D]
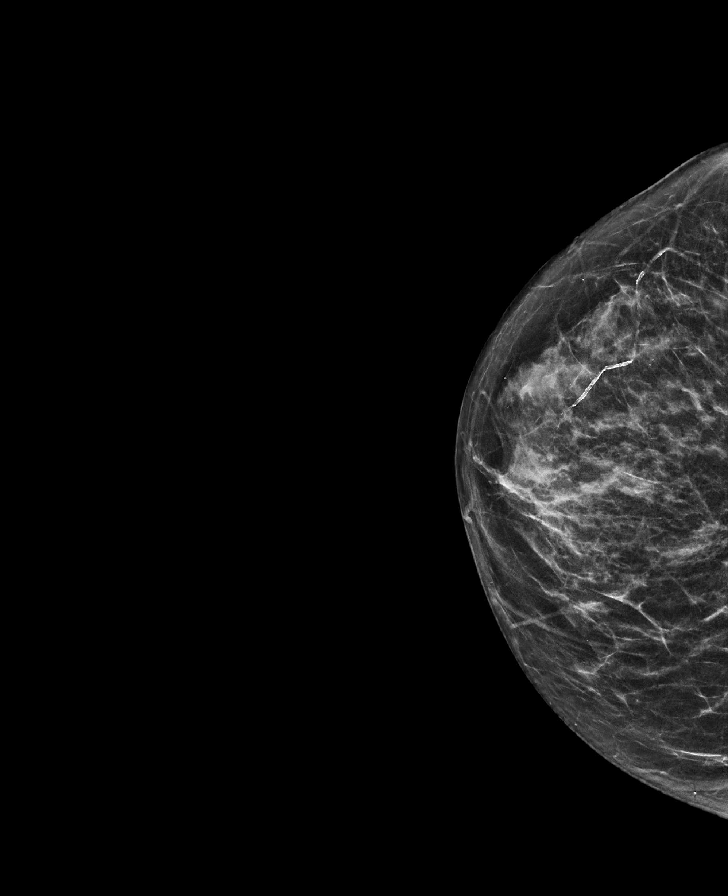

[R CC tomo · tomo slice 28/55.0]
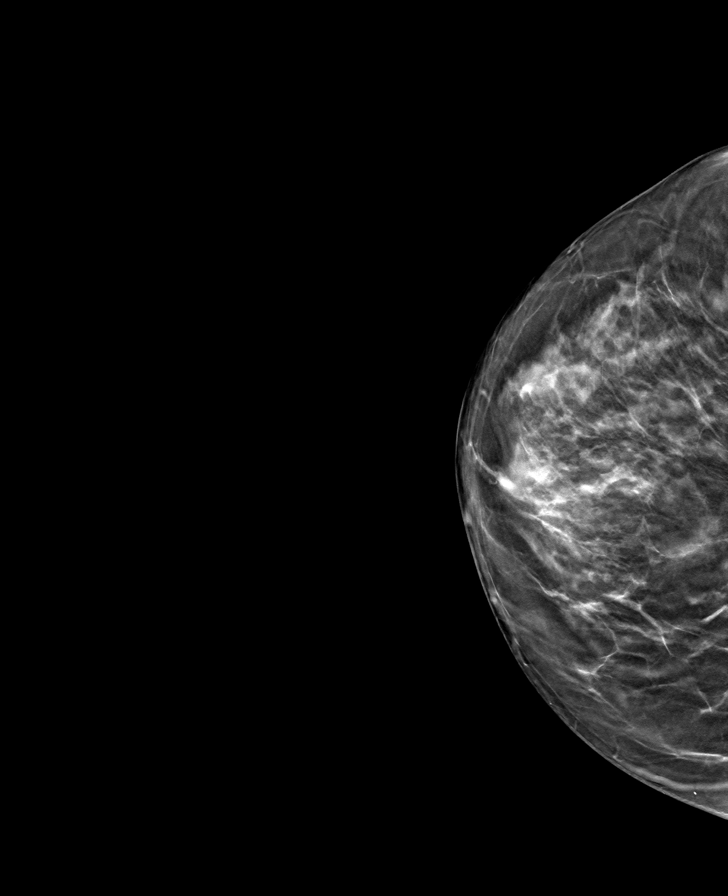

[L CC tomo · tomo slice 29/56.0]
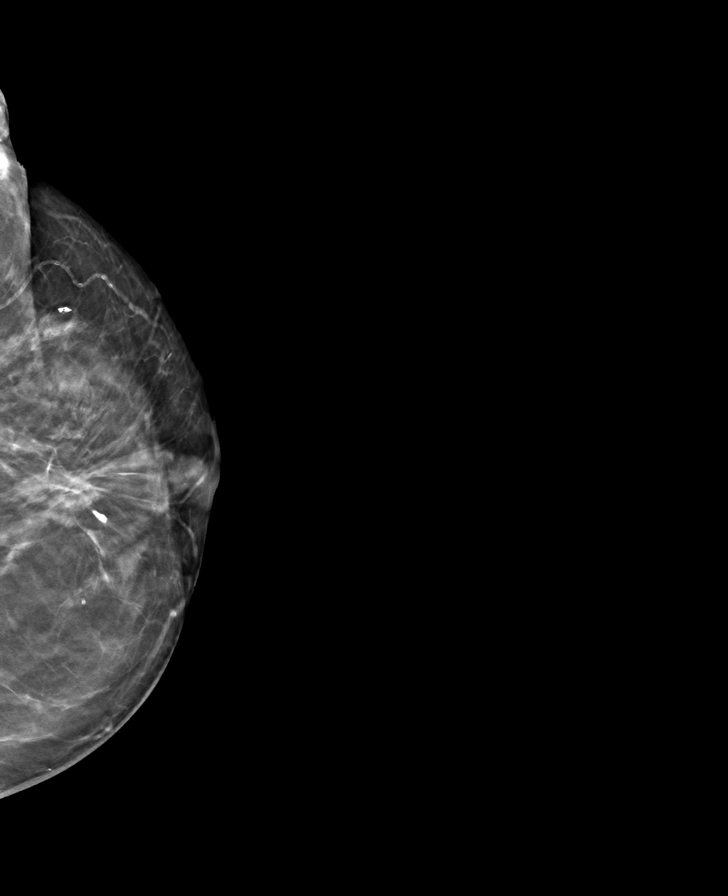

[R MLO tomo · tomo slice 43/84.0]
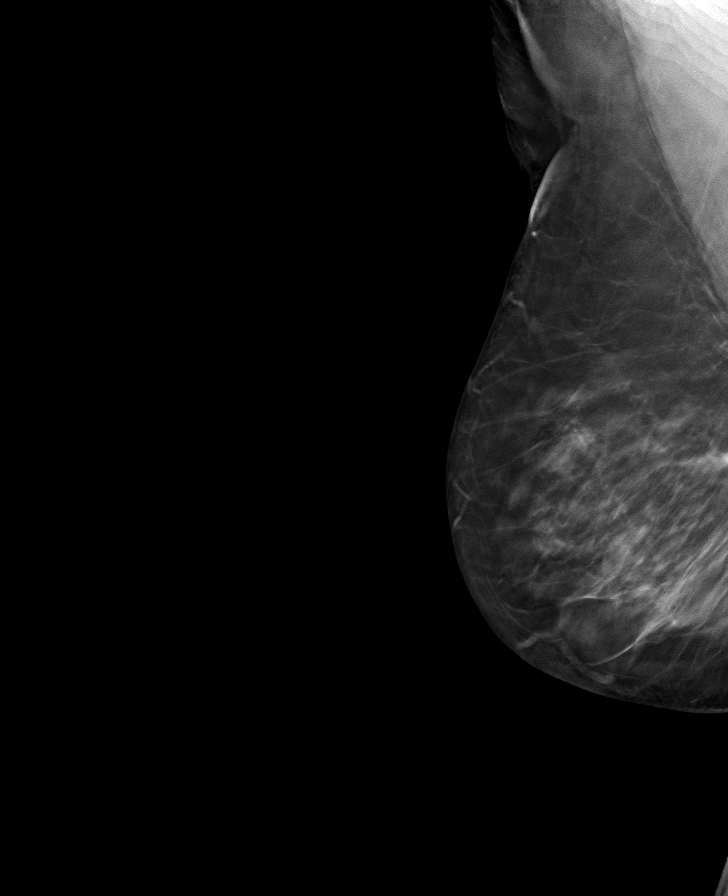

[L MLO tomo · tomo slice 45/89.0]
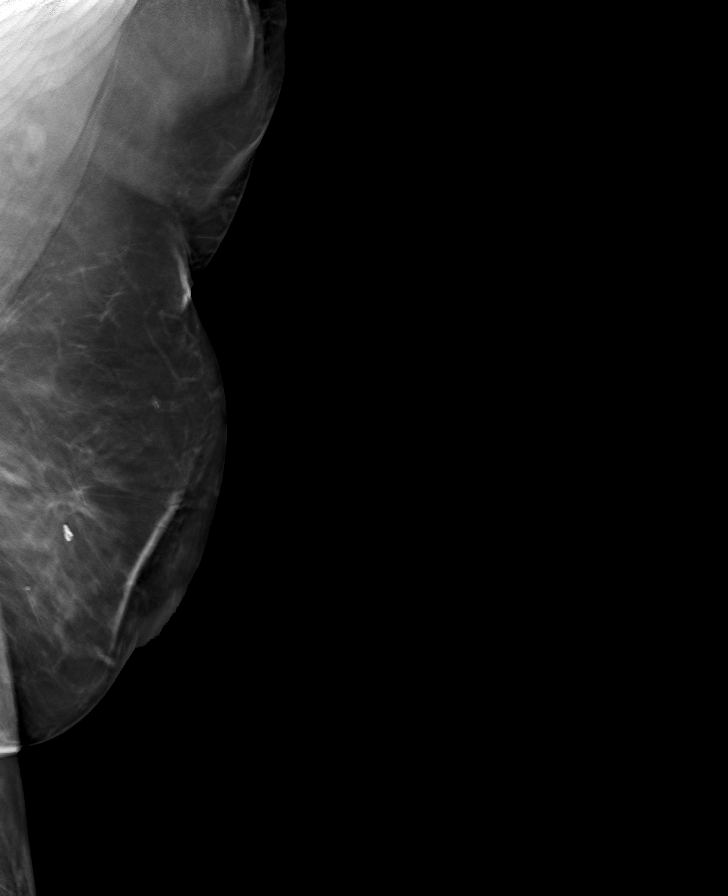

[8 of 24 positions shown; findings below may reference images not displayed]

ACR Breast Density Category c: The breast tissue is heterogeneously
dense, which may obscure small masses.
FINDINGS: Bilateral post lumpectomy changes are stable.

There are no masses or areas of nonsurgical architectural
distortion. There are no suspicious calcifications. No mammographic
change.
IMPRESSION: 1. No evidence of breast malignancy.
2. Benign post lumpectomy changes bilaterally.

RECOMMENDATION:
Screening mammogram in one year.(Code:RP-[id number])

I have discussed the findings and recommendations with the patient.
If applicable, a reminder letter will be sent to the patient
regarding the next appointment.

BI-RADS CATEGORY  2: Benign.

## 2022-06-18 NOTE — Telephone Encounter (Signed)
Pt called to ask where she could go to be fitted for lymphedema sleeve aside from [facility]. Advised pt to try [facility] supply. She verbalized thanks and understanding.

## 2022-06-21 ENCOUNTER — Ambulatory Visit (INDEPENDENT_AMBULATORY_CARE_PROVIDER_SITE_OTHER): Payer: 59 | Admitting: Physician Assistant

## 2022-06-21 ENCOUNTER — Encounter: Payer: Self-pay | Admitting: Physician Assistant

## 2022-06-21 ENCOUNTER — Other Ambulatory Visit: Payer: 59

## 2022-06-21 DIAGNOSIS — M79622 Pain in left upper arm: Secondary | ICD-10-CM

## 2022-06-21 DIAGNOSIS — E7800 Pure hypercholesterolemia, unspecified: Secondary | ICD-10-CM | POA: Diagnosis not present

## 2022-06-21 NOTE — Progress Notes (Signed)
Office Visit Note   Patient: Ashley Lindsey           Date of Birth: [birth year]           MRN: [id number] Visit Date: 06/21/2022              Requested by: [name], [place],   [id number] PCP: [name], FNP  No chief complaint on file.     HPI: Ashley Lindsey is a pleasant 62 year old woman who is a former patient of Dr. Rudene Anda.  She has had ongoing difficulty with left biceps pain.  She denies any injury.  She has gotten significant relief from wearing a compression sleeve.  She is status postmastectomy and lymph node dissection bilaterally in 2015 for the left.  Unfortunately she has no longer appropriate fitting sleeve.  Denies much volar lower arm pain today but mostly in her biceps is tender enough that she does not like to extend her arm.  She also complains of swelling  Assessment & Plan: Visit Diagnoses: Left arm swelling.  Plan: Findings consistent with lymphedema.  Also could have underlying biceps tendinitis.  Her tendon is intact.  I offered her occupational therapy she declined it at this time.  I do think this compression sleeve is appropriate as she says this gets rid of 100% of her pain.  She is going to follow-up on having her knee when made.  As a precaution would also get a venous Doppler study of her left upper extremity to ensure that she has no kind of vascular issue though this would be highly unlikely since she is getting good relief from a sleeve  Follow-Up Instructions: Return if symptoms worsen or fail to improve.   Ortho Exam  Patient is alert, oriented, no adenopathy, well-dressed, normal affect, normal respiratory effort. Examination of her left arm she does have some mild soft tissue swelling but no associated cellulitis in the arm compared to the unaffected side.  She has a strong radial pulse.  Sensation is intact.  She does have pain with extension and her biceps.  She is also very tender in the biceps muscle  itself.  No real shoulder complaints today compartments are soft  Imaging: No results found. No images are attached to the encounter.  Labs: Lab Results  Component Value Date   HGBA1C 5.8 (H) 03/05/2022   HGBA1C 5.9 (H) 10/16/2021   HGBA1C 6.6 (H) 02/08/2021   ESRSEDRATE 48 (H) 08/26/2019   LABURIC 4.8 08/26/2019   REPTSTATUS 06/12/2017 FINAL 06/10/2017   CULT >=100,000 COLONIES/mL ESCHERICHIA COLI (A) 06/10/2017   LABORGA ESCHERICHIA COLI (A) 06/10/2017     Lab Results  Component Value Date   ALBUMIN 4.3 03/29/2022   ALBUMIN 4.2 03/05/2022   ALBUMIN 4.6 02/08/2021    Lab Results  Component Value Date   MG 1.9 01/16/2022   MG 2.0 02/17/2020   Lab Results  Component Value Date   VD25OH 41.6 01/16/2022   VD25OH 32 12/26/2011   VD25OH 55 06/23/2010    No results found for: "PREALBUMIN"    Latest Ref Rng & Units 04/12/2022    4:13 PM 03/05/2022   11:10 AM 04/06/2021    2:48 AM  CBC EXTENDED  WBC 4.0 - 10.5 K/uL 9.6  10.0  12.0   RBC 3.87 - 5.11 Mil/uL 4.83  4.26  4.05   Hemoglobin 12.0 - 15.0 g/dL 11.0  8.2  9.1   HCT 36.0 - 46.0 % 36.7  29.2  31.8   Platelets 150.0 - 400.0 K/uL 293.0  259  361   NEUT# 1.4 - 7.7 K/uL 6.2   8.4   Lymph# 0.7 - 4.0 K/uL 2.6   2.6      There is no height or weight on file to calculate BMI.  Orders:  No orders of the defined types were placed in this encounter.  No orders of the defined types were placed in this encounter.    Procedures: No procedures performed  Clinical Data: No additional findings.  ROS:  All other systems negative, except as noted in the HPI. Review of Systems  Objective: Vital Signs: There were no vitals taken for this visit.  Specialty Comments:  No specialty comments available.  PMFS History: Patient Active Problem List   Diagnosis Date Noted   Chronic gastric ulcer without hemorrhage and without perforation 04/30/2022   Gastric AVM 04/30/2022   Iron deficiency anemia 03/06/2022    Unilateral primary osteoarthritis, left knee 10/02/2021   Pulmonary nodules 07/12/2021   Chronic cough 07/12/2021   Avulsion fracture of middle phalanx of finger 04/18/2021   Tibial plateau fracture, left 04/18/2021   Asymmetrical sensorineural hearing loss 02/02/2021   Bilateral primary osteoarthritis of knee 02/09/2020   Chronic intermittent hypoxia with obstructive sleep apnea 08/24/2019   Chronic intractable headache 07/14/2019   Morning headache 07/14/2019   Loud snoring 07/14/2019   Super obesity 07/14/2019   Sleeps in sitting position due to orthopnea 07/14/2019   Chest pain due to GERD 07/14/2019   Nocturia more than twice per night 07/14/2019   Unilateral primary osteoarthritis, right knee 05/26/2019   Chronic migraine without aura without status migrainosus, not intractable 05/13/2019   Class 3 severe obesity due to excess calories without serious comorbidity with body mass index (BMI) of 40.0 to 44.9 in adult ([place]) 08/05/2018   Depression 08/05/2018   Chronic nonintractable headache 08/05/2018   Elevated blood-pressure reading without diagnosis of hypertension 06/06/2018   Chronic pain of right ankle 04/24/2018   Breast cancer of lower-outer quadrant of right female breast ([place]) 12/02/2014   Bronchospasm 09/21/2014   Dyspnea 09/21/2014   Chronic pain of right knee 09/21/2014   Fibromyalgia 03/06/2013   Dense breasts 03/06/2013   Mastalgia 03/06/2013   Back pain, lumbosacral 03/06/2013   Lymphedema of arm 03/06/2013   Atypical chest pain 01/06/2013   Breast cancer of upper-inner quadrant of left female breast ([place]) 09/08/2012   Family history of malignant neoplasm of breast 09/08/2012   S/P radiation therapy    History of breast cancer in female 08/13/2011   Past Medical History:  Diagnosis Date   Abdominal cramps    Anxiety    Arthritis    Cancer ([place])    s/p radiation- last dose 6/12//has been off Tamoxifen 8/12   Chronic headaches    Costochondritis     Fatigue    Fibromyalgia 03/06/2013   GERD (gastroesophageal reflux disease)    History of COVID-19    Hypertension    Lymphedema    RIGHT ARM-////  STATES USE LEFT ARM FOR BP'S   Migraines    Muscle spasms of head and/or neck    following to the breast   Passed out    4th of july   Personal history of radiation therapy 2010   lt breast    S/P radiation therapy 08/19/07 - 10/10/07   Left Breast/5040 cGy/28 fractions with Boost for a toatl dose of 6300 dGy   S/P radiation therapy  08/14/10 -10/03/10   right breast   Sleep apnea    STOP BANG SCORE 5    Family History  Problem Relation Age of Onset   Breast cancer Mother 38   Heart disease Father    Cancer Maternal Aunt 63       d. from "female cancer" possibly cervical   Breast cancer Maternal Aunt 60   Prostate cancer Maternal Uncle 78   Prostate cancer Maternal Uncle 80   Prostate cancer Paternal Uncle    Prostate cancer Paternal Uncle    Prostate cancer Maternal Grandfather 37   Breast cancer Cousin 41       mat 1st cousin    Past Surgical History:  Procedure Laterality Date   ABDOMINAL HYSTERECTOMY     with left salpingooophorectomy   BIOPSY  04/30/2022   Procedure: BIOPSY;  Surgeon: Doran Stabler, MD;  Location: [facility];  Service: Gastroenterology;;   BREAST LUMPECTOMY  2009/2012   LEFT/RIGHT with lymph node dissection   COLONOSCOPY WITH PROPOFOL N/A 04/30/2022   Procedure: COLONOSCOPY WITH PROPOFOL;  Surgeon: Doran Stabler, MD;  Location: [facility];  Service: Gastroenterology;  Laterality: N/A;   ESOPHAGOGASTRODUODENOSCOPY (EGD) WITH PROPOFOL N/A 04/30/2022   Procedure: ESOPHAGOGASTRODUODENOSCOPY (EGD) WITH PROPOFOL;  Surgeon: Doran Stabler, MD;  Location: [facility];  Service: Gastroenterology;  Laterality: N/A;   HOT HEMOSTASIS N/A 04/30/2022   Procedure: HOT HEMOSTASIS (ARGON PLASMA COAGULATION/BICAP);  Surgeon: Doran Stabler, MD;  Location: [facility];  Service: Gastroenterology;  Laterality:  N/A;   KNEE ARTHROSCOPY     right   OOPHORECTOMY  2013   Social History   Occupational History   Occupation: disability  Tobacco Use   Smoking status: Former    Packs/day: 0.25    Years: 1.00    Total pack years: 0.25    Types: Cigarettes   Smokeless tobacco: Never  Vaping Use   Vaping Use: Never used  Substance and Sexual Activity   Alcohol use: Not Currently   Drug use: No   Sexual activity: Not Currently    Birth control/protection: Surgical

## 2022-06-22 LAB — LIPID PANEL
Chol/HDL Ratio: 3.2 ratio (ref 0.0–4.4)
Cholesterol, Total: 203 mg/dL — ABNORMAL HIGH (ref 100–199)
HDL: 63 mg/dL (ref >39)
LDL Chol Calc (NIH): 107 mg/dL — ABNORMAL HIGH (ref 0–99)
Triglycerides: 193 mg/dL — ABNORMAL HIGH (ref 0–149)
VLDL Cholesterol Cal: 33 mg/dL (ref 5–40)

## 2022-06-25 ENCOUNTER — Other Ambulatory Visit: Payer: Self-pay | Admitting: Nurse Practitioner

## 2022-06-25 ENCOUNTER — Telehealth: Payer: Self-pay | Admitting: *Deleted

## 2022-06-25 DIAGNOSIS — R110 Nausea: Secondary | ICD-10-CM

## 2022-06-25 NOTE — Telephone Encounter (Signed)
Received call from pt stating she was taking three 75 mg Effexors a day instead of one 150 mg and one 75 mg.  Pt states she is now out of the 75 mg and the pharmacy will not refill until April 16th.  RN educated pt on importance of taking Effexor as prescribed.  RN also educated pt on paying cash for Effexor 75 mg and using a [place] savings card.  Pt verbalized understanding.

## 2022-06-26 ENCOUNTER — Ambulatory Visit: Payer: 59 | Admitting: Cardiology

## 2022-06-27 ENCOUNTER — Other Ambulatory Visit: Payer: 59

## 2022-06-29 ENCOUNTER — Telehealth: Payer: Self-pay | Admitting: Gastroenterology

## 2022-06-29 NOTE — Telephone Encounter (Signed)
Left message for pt that she can come for labs and the orders are in epic.

## 2022-06-29 NOTE — Telephone Encounter (Signed)
Inbound call from patient stating that she missed her appointment with Dr. Loletha Carrow back in February and was supposed to have labs drawn. Patient rescheduled her appointment with Dr. Loletha Carrow on 4/4 at 9:20 and is requesting a call back to discuss if she needs to have her labs drawn before then and if the orders are still good. Please advise.

## 2022-07-03 ENCOUNTER — Ambulatory Visit: Payer: 59 | Admitting: Cardiology

## 2022-07-05 ENCOUNTER — Encounter: Payer: Self-pay | Admitting: Nurse Practitioner

## 2022-07-05 ENCOUNTER — Telehealth: Payer: Self-pay

## 2022-07-05 ENCOUNTER — Ambulatory Visit (INDEPENDENT_AMBULATORY_CARE_PROVIDER_SITE_OTHER): Payer: 59 | Admitting: Nurse Practitioner

## 2022-07-05 ENCOUNTER — Other Ambulatory Visit (INDEPENDENT_AMBULATORY_CARE_PROVIDER_SITE_OTHER): Payer: 59

## 2022-07-05 VITALS — BP 140/80 | HR 100 | Temp 98.6°F | Ht 62.0 in | Wt 271.8 lb

## 2022-07-05 DIAGNOSIS — D509 Iron deficiency anemia, unspecified: Secondary | ICD-10-CM

## 2022-07-05 DIAGNOSIS — R1013 Epigastric pain: Secondary | ICD-10-CM

## 2022-07-05 DIAGNOSIS — I10 Essential (primary) hypertension: Secondary | ICD-10-CM | POA: Diagnosis not present

## 2022-07-05 DIAGNOSIS — R1084 Generalized abdominal pain: Secondary | ICD-10-CM

## 2022-07-05 DIAGNOSIS — C50212 Malignant neoplasm of upper-inner quadrant of left female breast: Secondary | ICD-10-CM

## 2022-07-05 DIAGNOSIS — E11620 Type 2 diabetes mellitus with diabetic dermatitis: Secondary | ICD-10-CM | POA: Diagnosis not present

## 2022-07-05 DIAGNOSIS — G4733 Obstructive sleep apnea (adult) (pediatric): Secondary | ICD-10-CM

## 2022-07-05 DIAGNOSIS — M25562 Pain in left knee: Secondary | ICD-10-CM | POA: Diagnosis not present

## 2022-07-05 DIAGNOSIS — Z6841 Body Mass Index (BMI) 40.0 and over, adult: Secondary | ICD-10-CM

## 2022-07-05 DIAGNOSIS — I429 Cardiomyopathy, unspecified: Secondary | ICD-10-CM

## 2022-07-05 DIAGNOSIS — E46 Unspecified protein-calorie malnutrition: Secondary | ICD-10-CM | POA: Insufficient documentation

## 2022-07-05 DIAGNOSIS — Z170 Estrogen receptor positive status [ER+]: Secondary | ICD-10-CM | POA: Diagnosis not present

## 2022-07-05 LAB — CBC WITH DIFFERENTIAL/PLATELET
Basophils Absolute: 0.1 10*3/uL (ref 0.0–0.1)
Basophils Relative: 1.3 % (ref 0.0–3.0)
Eosinophils Absolute: 0.1 10*3/uL (ref 0.0–0.7)
Eosinophils Relative: 1.2 % (ref 0.0–5.0)
HCT: 39.3 % (ref 36.0–46.0)
Hemoglobin: 12.6 g/dL (ref 12.0–15.0)
Lymphocytes Relative: 30.7 % (ref 12.0–46.0)
Lymphs Abs: 2.7 10*3/uL (ref 0.7–4.0)
MCHC: 31.9 g/dL (ref 30.0–36.0)
MCV: 81.8 fl (ref 78.0–100.0)
Monocytes Absolute: 0.5 10*3/uL (ref 0.1–1.0)
Monocytes Relative: 5.8 % (ref 3.0–12.0)
Neutro Abs: 5.4 10*3/uL (ref 1.4–7.7)
Neutrophils Relative %: 61 % (ref 43.0–77.0)
Platelets: 351 10*3/uL (ref 150.0–400.0)
RBC: 4.81 Mil/uL (ref 3.87–5.11)
RDW: 16.6 % — ABNORMAL HIGH (ref 11.5–15.5)
WBC: 8.9 10*3/uL (ref 4.0–10.5)

## 2022-07-05 LAB — IBC + FERRITIN
Ferritin: 19.3 ng/mL (ref 10.0–291.0)
Iron: 38 ug/dL — ABNORMAL LOW (ref 42–145)
Saturation Ratios: 10.5 % — ABNORMAL LOW (ref 20.0–50.0)
TIBC: 362.6 ug/dL (ref 250.0–450.0)
Transferrin: 259 mg/dL (ref 212.0–360.0)

## 2022-07-05 MED ORDER — CYCLOBENZAPRINE HCL 10 MG PO TABS: 10.0000 mg | ORAL_TABLET | Freq: Three times a day (TID) | ORAL | 1 refills | Status: DC

## 2022-07-05 NOTE — Telephone Encounter (Signed)
Provider requested update on status of referral and if patient was attending PREP as scheduled from 06/04/22. Pt withdrew from that class never completing intake.  Retried to reach patient today to offer next PREP class 07/10/22 as well as new classes starting in April for her to chose from.  No call back after text sent on 07/03/22 about interest.

## 2022-07-05 NOTE — Patient Instructions (Signed)

## 2022-07-05 NOTE — Telephone Encounter (Signed)
Pt returned call.  Confirmed she wants to do the 07/10/22 class Tues/thur 230p-345p Intake scheduled for 07/09/22 at 430p at [facility]. will meet her at the front desk.

## 2022-07-05 NOTE — Progress Notes (Signed)
[name],acting as a Education administrator for [name], FNP.,have documented all relevant documentation on the behalf of [name], FNP,as directed by  [name], FNP while in the presence of [name], [place].    Subjective:     Patient ID: Ashley Lindsey , female    DOB: Aug 05, 1960 , 62 y.o.   MRN: [id number]   Chief Complaint  Patient presents with   Hypertension   Diabetes    HPI  Patient presents today for BP and DM check. Patient states compliance with medication. She has not taken her blood pressure medications due to fasting. She is fasting today. She had zucchini, squash and shrimp and fried rice. She did not use any soy sauce. She is cooking in olive and canola oil.   Patient states she thinks the magnesium is giving her a headache, she's states she is going to stop the for a while to see if her headache goes away. Patient hasn't taking BP medication today. Patient states her knee is really hurting today. She started with PREP today and reports she aggravated her left knee.   She had her colonoscopy and is feeling better.  She is going to the ophthalmologist due to drooping to her right eye since October. This is more noticeable to her and others.   BP Readings from Last 3 Encounters: 07/05/22 : (!) 140/80 04/30/22 : 129/74 03/23/22 : (!) 147/66    Hypertension This is a chronic problem. The current episode started more than 1 year ago. The problem has been gradually improving since onset. The problem is controlled. Pertinent negatives include no anxiety, chest pain, headaches or palpitations. Risk factors for coronary artery disease include sedentary lifestyle and obesity. There are no compliance problems.  There is no history of chronic renal disease.  Diabetes Pertinent negatives for hypoglycemia include no headaches. Pertinent negatives for diabetes include no chest pain.     Past Medical History:  Diagnosis Date   Abdominal cramps    Anxiety    Arthritis     Cancer ([place])    s/p radiation- last dose 6/12//has been off Tamoxifen 8/12   Chronic headaches    Costochondritis    Fatigue    Fibromyalgia 03/06/2013   GERD (gastroesophageal reflux disease)    History of COVID-19    Hypertension    Lymphedema    RIGHT ARM-////  STATES USE LEFT ARM FOR BP'S   Migraines    Muscle spasms of head and/or neck    following to the breast   Passed out    4th of july   Personal history of radiation therapy 2010   lt breast    S/P radiation therapy 08/19/07 - 10/10/07   Left Breast/5040 cGy/28 fractions with Boost for a toatl dose of 6300 dGy   S/P radiation therapy 08/14/10 -10/03/10   right breast   Sleep apnea    STOP BANG SCORE 5     Family History  Problem Relation Age of Onset   Breast cancer Mother 85   Heart disease Father    Cancer Maternal Aunt 63       d. from "female cancer" possibly cervical   Breast cancer Maternal Aunt 60   Prostate cancer Maternal Uncle 78   Prostate cancer Maternal Uncle 80   Prostate cancer Paternal Uncle    Prostate cancer Paternal Uncle    Prostate cancer Maternal Grandfather 51   Breast cancer Cousin 37       mat 1st cousin  Current Outpatient Medications:    acetaminophen (TYLENOL) 650 MG CR tablet, Take 650 mg by mouth every 8 (eight) hours as needed for pain., Disp: , Rfl:    albuterol (VENTOLIN HFA) 108 (90 Base) MCG/ACT inhaler, INHALE 1-2 PUFFS INTO THE LUNGS EVERY 6 (SIX) HOURS AS NEEDED FOR WHEEZING OR SHORTNESS OF BREATH., Disp: 6.7 each, Rfl: 1   aspirin-acetaminophen-caffeine (EXCEDRIN MIGRAINE) 250-250-65 MG tablet, Take 1 tablet by mouth every 6 (six) hours as needed for headache., Disp: , Rfl:    atorvastatin (LIPITOR) 10 MG tablet, TAKE 1 TABLET BY MOUTH EVERY DAY AT NIGHT, Disp: 90 tablet, Rfl: 1   benzonatate (TESSALON PERLES) 100 MG capsule, Take 1 capsule (100 mg total) by mouth every 6 (six) hours as needed., Disp: 30 capsule, Rfl: 1   Bisacodyl (DULCOLAX PO), Take 1 capsule by  mouth daily as needed (constipation). , Disp: , Rfl:    cholecalciferol 5000 units TABS, Take 1 tablet (5,000 Units total) by mouth 2 (two) times daily. (Patient taking differently: Take 5,000 Units by mouth daily.), Disp: , Rfl:    cyclobenzaprine (FLEXERIL) 10 MG tablet, Take 1 tablet (10 mg total) by mouth 2 (two) times daily as needed for muscle spasms., Disp: 30 tablet, Rfl: 0   dexlansoprazole (DEXILANT) 60 MG capsule, TAKE 1 CAPSULE BY MOUTH EVERY DAY, Disp: 90 capsule, Rfl: 1   diclofenac Sodium (VOLTAREN) 1 % GEL, Apply 2 g topically 4 (four) times daily., Disp: 2 g, Rfl: 3   diphenhydrAMINE (BENADRYL) 25 MG tablet, Take 1 tablet (25 mg total) by mouth every 6 (six) hours as needed for up to 20 doses (migraine). Take with reglan for migraine headache, Disp: 20 tablet, Rfl: 0   ELDERBERRY PO, Take 1 tablet by mouth daily., Disp: , Rfl:    ENTRESTO 24-26 MG, TAKE 1 TABLET BY MOUTH TWICE A DAY, Disp: 180 tablet, Rfl: 0   fish oil-omega-3 fatty acids 1000 MG capsule, Take 1 g by mouth daily., Disp: , Rfl:    Flaxseed, Linseed, (FLAXSEED OIL MAX STR PO), Take 1 capsule by mouth daily. , Disp: , Rfl:    gabapentin (NEURONTIN) 300 MG capsule, TAKE 1 CAPSULE BY MOUTH THREE TIMES A DAY, Disp: 90 capsule, Rfl: 3   Magnesium 100 MG TABS, Take 1 tablet by mouth daily. Takes 50 mg, Disp: , Rfl:    meloxicam (MOBIC) 7.5 MG tablet, Take 1 tablet (7.5 mg total) by mouth daily., Disp: 90 tablet, Rfl: 0   mometasone (NASONEX) 50 MCG/ACT nasal spray, Place 2 sprays into the nose daily., Disp: 1 each, Rfl: 2   Multiple Vitamins-Minerals (MULTIVITAMIN & MINERAL PO), Take 1 tablet by mouth daily., Disp: , Rfl:    mupirocin ointment (BACTROBAN) 2 %, Apply 1 Application topically 2 (two) times daily., Disp: 22 g, Rfl: 0   ondansetron (ZOFRAN) 4 MG tablet, TAKE 1 TABLET BY MOUTH DAILY AS NEEDED FOR NAUSEA OR VOMITING, Disp: 30 tablet, Rfl: 1   OVER THE COUNTER MEDICATION, Take 125 mg by mouth daily.  [place]/Simethicone, Disp: , Rfl:    propranolol (INDERAL) 10 MG tablet, TAKE 1 TABLET BY MOUTH TWICE A DAY *HOLD IF SYSTOLIC BP (TOP #) IS 123456 OR PULSE IS <60BPM, Disp: 180 tablet, Rfl: 1   spironolactone (ALDACTONE) 25 MG tablet, TAKE 1 TABLET BY MOUTH EVERY DAY IN THE MORNING, Disp: 90 tablet, Rfl: 0   SYMBICORT 80-4.5 MCG/ACT inhaler, INHALE 2 PUFFS INTO THE LUNGS IN THE MORNING AND AT BEDTIME., Disp: 30.6 each,  Rfl: 1   topiramate (TOPAMAX) 25 MG tablet, Take 1 tablet (25 mg total) by mouth 2 (two) times daily., Disp: 180 tablet, Rfl: 3   triamcinolone cream (KENALOG) 0.1 %, APPLY 1 APPLICATION TOPICALLY 2 (TWO) TIMES DAILY AS NEEDED (RASH)., Disp: 60 g, Rfl: 1   Turmeric 500 MG CAPS, Take by mouth., Disp: , Rfl:    venlafaxine XR (EFFEXOR-XR) 150 MG 24 hr capsule, TAKE 1 CAPSULE BY MOUTH NIGHTLY WITH 75MG  CAPSULE FOR A TOTAL OF 225MG  NIGHTLY, Disp: 90 capsule, Rfl: 3   venlafaxine XR (EFFEXOR-XR) 75 MG 24 hr capsule, Total dose 225 mg, Disp: 90 capsule, Rfl: 3   Vitamin D, Ergocalciferol, (DRISDOL) 1.25 MG (50000 UNIT) CAPS capsule, Take 1 capsule (50,000 Units total) by mouth every 7 (seven) days., Disp: 20 capsule, Rfl: 0   vitamin E 200 UNIT capsule, Take 200 Units by mouth daily., Disp: , Rfl:    cyclobenzaprine (FLEXERIL) 10 MG tablet, Take 1 tablet (10 mg total) by mouth 3 (three) times daily., Disp: 90 tablet, Rfl: 1   Allergies  Allergen Reactions   Aspirin     unknown   Penicillins Hives and Swelling    Has patient had a PCN reaction causing immediate rash, facial/tongue/throat swelling, SOB or lightheadedness with hypotension: Yes Has patient had a PCN reaction causing severe rash involving mucus membranes or skin necrosis: Yes Has patient had a PCN reaction that required hospitalization Yes Has patient had a PCN reaction occurring within the last 10 years: No If all of the above answers are "NO", then may proceed with Cephalosporin use.      Review of Systems   Constitutional: Negative.   Respiratory: Negative.    Cardiovascular: Negative.  Negative for chest pain, palpitations and leg swelling.  Gastrointestinal: Negative.   Neurological: Negative.  Negative for headaches.  Psychiatric/Behavioral: Negative.       Today's Vitals   07/05/22 1017  BP: (!) 140/80  Pulse: 100  Temp: 98.6 F (37 C)  TempSrc: Oral  Weight: 271 lb 12.8 oz (123.3 kg)  Height: 5\' 2"  (1.575 m)  PainSc: 10-Worst pain ever  PainLoc: Knee   Body mass index is 49.71 kg/m.  Wt Readings from Last 3 Encounters:  07/10/22 274 lb 6.4 oz (124.5 kg)  07/05/22 271 lb 12.8 oz (123.3 kg)  04/30/22 267 lb (121.1 kg)    Objective:  Physical Exam Vitals reviewed.  Constitutional:      General: She is not in acute distress.    Appearance: Normal appearance. She is obese.  Cardiovascular:     Rate and Rhythm: Normal rate and regular rhythm.     Pulses: Normal pulses.     Heart sounds: Normal heart sounds. No murmur heard. Pulmonary:     Effort: Pulmonary effort is normal. No respiratory distress.     Breath sounds: Normal breath sounds. No wheezing.  Musculoskeletal:        General: Tenderness (left knee lateral) and signs of injury (continued to use exercise machine even though having pain) present. No swelling.  Skin:    General: Skin is warm.  Neurological:     General: No focal deficit present.     Mental Status: She is alert and oriented to person, place, and time.     Cranial Nerves: No cranial nerve deficit.     Motor: No weakness.  Psychiatric:        Mood and Affect: Mood normal.        Behavior: Behavior  normal.        Thought Content: Thought content normal.        Judgment: Judgment normal.         Assessment And Plan:     1. Essential hypertension Comments: Blood pressure is fairly controlled.  Continue follow-up with cardiology and taking your medications as directed. - BMP8+eGFR  2. Type 2 diabetes mellitus with diabetic dermatitis,  without long-term current use of insulin (HCC) Comments: Hemoglobin A1c is stable.  Continue current medications. Diabetic foot exam done no abnormal findings. - Hemoglobin A1c  3. Cardiomyopathy, unspecified type [facility]) Comments: Continue f/u with Cardiology  4. Malignant neoplasm of upper-inner quadrant of left breast in female, estrogen receptor positive ([place]) Comments: Continue follow-up with oncology.  5. Obstructive sleep apnea Comments: Will refer for sleep study.  She was to be on CPAP several years ago. - Ambulatory referral to Sleep Studies  6. Acute pain of left knee Comments: Swelling and tenderness to left knee at care working out at gym.  Advised if has pain during x-ray size to stop.  7. Class 3 severe obesity due to excess calories without serious comorbidity with body mass index (BMI) of 40.0 to 44.9 in adult [facility])  She is encouraged to strive for BMI less than 30 to decrease cardiac risk. Advised to aim for at least 150 minutes of exercise per week. Continue exercising with PREP as tolerated  Patient was given opportunity to ask questions. Patient verbalized understanding of the plan and was able to repeat key elements of the plan. All questions were answered to their satisfaction.  [name], FNP   I, [name], FNP, have reviewed all documentation for this visit. The documentation on 07/05/22 for the exam, diagnosis, procedures, and orders are all accurate and complete.   IF YOU HAVE BEEN REFERRED TO A SPECIALIST, IT MAY TAKE 1-2 WEEKS TO SCHEDULE/PROCESS THE REFERRAL. IF YOU HAVE NOT HEARD FROM US/SPECIALIST IN TWO WEEKS, PLEASE GIVE [place] A CALL AT [phone] X [phone].   THE PATIENT IS ENCOURAGED TO PRACTICE SOCIAL DISTANCING DUE TO THE COVID-19 PANDEMIC.

## 2022-07-09 ENCOUNTER — Telehealth: Payer: Self-pay

## 2022-07-09 ENCOUNTER — Ambulatory Visit (HOSPITAL_COMMUNITY)
Admission: RE | Admit: 2022-07-09 | Discharge: 2022-07-09 | Disposition: A | Discharge status: Home / Self Care | Payer: 59 | Source: Ambulatory Visit | Attending: Physician Assistant | Admitting: Physician Assistant

## 2022-07-09 DIAGNOSIS — M79622 Pain in left upper arm: Secondary | ICD-10-CM | POA: Diagnosis not present

## 2022-07-09 NOTE — Telephone Encounter (Signed)
FYI-  Candace with [facility] wanted to let Bevely Palmer know that patient is Negative for DVT, left UE.  Please advise.  Thank you.

## 2022-07-09 NOTE — Progress Notes (Signed)
VASCULAR LAB    Left upper extremity venous duplex has been performed.  See CV proc for preliminary results.  Called negative results to April  Korver Graybeal, [facility], RVT 07/09/2022, 10:37 AM

## 2022-07-09 NOTE — Telephone Encounter (Signed)
Call to pt. Was scheduled for intake today at 4pm.  Forgot to come to meet with me. Still wants to come to start tomorrow.  Agreeable to doing intake at end of class.  Reminded of time of class start tomorrow at 230p

## 2022-07-10 NOTE — Progress Notes (Signed)
[facility] PRE[facility] Evaluation  Patient Details  Name: Ashley Lindsey MRN: [id number] Date of Birth: [birth year] Age: 62 y.o. PCP: [name], FNP  Vitals:   07/10/22 1615  BP: 118/80  Pulse: 89  SpO2: 98%  Weight: 274 lb 6.4 oz (124.5 kg)     [facility] Eval - 07/10/22 1600       [facility] "PREP" Location   [facility] "PREP" Location [facility]      Referral    Referring Provider Moore    Reason for referral Hypertension;Inactivity;Obesitity/Overweight    Program Start Date 07/10/22   T/TH 230p-345p x 12 wks     Measurement   Waist Circumference 54.5 inches    Hip Circumference 63 inches    Body fat --   not able to be measured     Information for Trainer   Goals Lose weight, get stronger, avoid additional surgery    Current Exercise none    Orthopedic Concerns Bil knees    Pertinent Medical History OSA,GERD, HTN, migraines, hx of stroke and breast cancer x 2    Current Barriers family obilgations    Restrictions/Precautions Fall risk   knee pain, knee bowing   Medications that affect exercise Medication causing dizziness/drowsiness      Timed Up and Go (TUGS)   Timed Up and Go High risk >13 seconds      Mobility and Daily Activities   I find it easy to walk up or down two or more flights of stairs. 1    I have no trouble taking out the trash. 4    I do housework such as vacuuming and dusting on my own without difficulty. 2    I can easily lift a gallon of milk (8lbs). 4    I can easily walk a mile. 1    I have no trouble reaching into high cupboards or reaching down to pick up something from the floor. 2    I do not have trouble doing out-door work such as Armed forces logistics/support/administrative officer, raking leaves, or gardening. 1      Mobility and Daily Activities   I feel younger than my age. 1    I feel independent. 4    I feel energetic. 2    I live an active life.  2    I feel strong. 2    I feel healthy. 2    I feel active as other people my age. 1      How fit and strong are you.   Fit and  Strong Total Score 29            Past Medical History:  Diagnosis Date   Abdominal cramps    Anxiety    Arthritis    Cancer ([place])    s/p radiation- last dose 6/12//has been off Tamoxifen 8/12   Chronic headaches    Costochondritis    Fatigue    Fibromyalgia 03/06/2013   GERD (gastroesophageal reflux disease)    History of COVID-19    Hypertension    Lymphedema    RIGHT ARM-////  STATES USE LEFT ARM FOR BP'S   Migraines    Muscle spasms of head and/or neck    following to the breast   Passed out    4th of july   Personal history of radiation therapy 2010   lt breast    S/P radiation therapy 08/19/07 - 10/10/07   Left Breast/5040 cGy/28 fractions with Boost for a toatl dose of  18 dGy   S/P radiation therapy 08/14/10 -10/03/10   right breast   Sleep apnea    STOP BANG SCORE 5   Past Surgical History:  Procedure Laterality Date   ABDOMINAL HYSTERECTOMY     with left salpingooophorectomy   BIOPSY  04/30/2022   Procedure: BIOPSY;  Surgeon: Doran Stabler, MD;  Location: [facility];  Service: Gastroenterology;;   BREAST LUMPECTOMY  2009/2012   LEFT/RIGHT with lymph node dissection   COLONOSCOPY WITH PROPOFOL N/A 04/30/2022   Procedure: COLONOSCOPY WITH PROPOFOL;  Surgeon: Doran Stabler, MD;  Location: [facility];  Service: Gastroenterology;  Laterality: N/A;   ESOPHAGOGASTRODUODENOSCOPY (EGD) WITH PROPOFOL N/A 04/30/2022   Procedure: ESOPHAGOGASTRODUODENOSCOPY (EGD) WITH PROPOFOL;  Surgeon: Doran Stabler, MD;  [facility]: [facility];  Service: Gastroenterology;  Laterality: N/A;   HOT HEMOSTASIS N/A 04/30/2022   Procedure: HOT HEMOSTASIS (ARGON PLASMA COAGULATION/BICAP);  Surgeon: Doran Stabler, MD;  Location: [facility];  Service: Gastroenterology;  Laterality: N/A;   KNEE ARTHROSCOPY     right   OOPHORECTOMY  2013   Social History   Tobacco Use  Smoking Status Former   Packs/day: 0.25   Years: 1.00   Additional pack years: 0.00   Total pack  years: 0.25   Types: Cigarettes  Smokeless Tobacco Never    Barnett Hatter 07/10/2022, 4:23 PM

## 2022-07-12 NOTE — Progress Notes (Signed)
[facility] PRE[facility] Weekly Session  Patient Details  Name: Ashley Lindsey MRN: [id number] Date of Birth: [birth year] Age: 62 y.o. PCP: [name], FNP  There were no vitals filed for this visit.   [facility] Weekly seesion - 07/12/22 1500       [facility] "PREP" Location   [facility] "PREP" Location [facility]      Weekly Session   Topic Discussed Goal setting and welcome to the program   Fit testing, stretch   Classes attended to date 2             Barnett Hatter 07/12/2022, 3:47 PM

## 2022-07-16 ENCOUNTER — Encounter: Payer: 59 | Attending: Physical Medicine and Rehabilitation | Admitting: Physical Medicine and Rehabilitation

## 2022-07-16 ENCOUNTER — Other Ambulatory Visit: Payer: Self-pay | Admitting: Physical Medicine and Rehabilitation

## 2022-07-16 VITALS — BP 106/64 | HR 94 | Ht 62.0 in | Wt 274.0 lb

## 2022-07-16 DIAGNOSIS — M79641 Pain in right hand: Secondary | ICD-10-CM | POA: Diagnosis not present

## 2022-07-16 DIAGNOSIS — M25561 Pain in right knee: Secondary | ICD-10-CM | POA: Insufficient documentation

## 2022-07-16 DIAGNOSIS — M797 Fibromyalgia: Secondary | ICD-10-CM | POA: Diagnosis not present

## 2022-07-16 DIAGNOSIS — I890 Lymphedema, not elsewhere classified: Secondary | ICD-10-CM | POA: Insufficient documentation

## 2022-07-16 DIAGNOSIS — G8929 Other chronic pain: Secondary | ICD-10-CM | POA: Diagnosis not present

## 2022-07-16 DIAGNOSIS — D509 Iron deficiency anemia, unspecified: Secondary | ICD-10-CM | POA: Diagnosis not present

## 2022-07-16 DIAGNOSIS — M25562 Pain in left knee: Secondary | ICD-10-CM | POA: Diagnosis not present

## 2022-07-16 MED ORDER — SEMAGLUTIDE-WEIGHT MANAGEMENT 1.7 MG/0.75ML ~~LOC~~ SOAJ: 1.7000 mg | SUBCUTANEOUS | 0 refills | Status: DC

## 2022-07-16 MED ORDER — SEMAGLUTIDE-WEIGHT MANAGEMENT 2.4 MG/0.75ML ~~LOC~~ SOAJ: 2.4000 mg | SUBCUTANEOUS | 0 refills | Status: DC

## 2022-07-16 MED ORDER — SEMAGLUTIDE-WEIGHT MANAGEMENT 0.5 MG/0.5ML ~~LOC~~ SOAJ: 0.5000 mg | SUBCUTANEOUS | 0 refills | Status: DC

## 2022-07-16 MED ORDER — SEMAGLUTIDE-WEIGHT MANAGEMENT 1 MG/0.5ML ~~LOC~~ SOAJ: 1.0000 mg | SUBCUTANEOUS | 0 refills | Status: DC

## 2022-07-16 MED ORDER — SEMAGLUTIDE-WEIGHT MANAGEMENT 0.25 MG/0.5ML ~~LOC~~ SOAJ: 0.2500 mg | SUBCUTANEOUS | 0 refills | Status: DC

## 2022-07-16 NOTE — Patient Instructions (Addendum)
Foods that may reduce pain: 1) Ginger (especially studied for arthritis)- reduce leukotriene production to decrease inflammation 2) Blueberries- high in phytonutrients that decrease inflammation 3) Salmon- marine omega-3s reduce joint swelling and pain 4) Pumpkin seeds- reduce inflammation 5) dark chocolate- reduces inflammation 6) turmeric- reduces inflammation 7) tart cherries - reduce pain and stiffness 8) extra virgin olive oil - its compound olecanthal helps to block prostaglandins  9) chili peppers- can be eaten or applied topically via capsaicin 10) mint- helpful for headache, muscle aches, joint pain, and itching 11) garlic- reduces inflammation  Link to further information on diet for chronic pain: [email or web address]    Obesity: -Consider Roobois tea daily.  -Discussed the benefits of intermittent fasting. Foods that can assist in weight loss: 1) leafy greens- high in fiber and nutrients 2) dark chocolate- improves metabolism (if prefer sweetened, best to sweeten with honey instead of sugar).  3) cruciferous vegetables- high in fiber and protein 4) full fat yogurt: high in healthy fat, protein, calcium, and probiotics 5) apples- high in a variety of phytochemicals 6) nuts- high in fiber and protein that increase feelings of fullness 7) grapefruit: rich in nutrients, antioxidants, and fiber (not to be taken with anticoagulation) 8) beans- high in protein and fiber 9) salmon- has high quality protein and healthy fats 10) green tea- rich in polyphenols 11) eggs- rich in choline and vitamin D 12) tuna- high protein, boosts metabolism 13) avocado- decreases visceral abdominal fat 14) chicken (pasture raised): high in protein and iron 15) blueberries- reduce abdominal fat and cholesterol 16) whole grains- decreases calories retained during digestion, speeds metabolism 17) chia seeds- curb appetite 18)  chilies- increases fat metabolism  -Discussed supplements that can be used:  1) Metatrim 400mg  BID 30 minutes before breakfast and dinner  2) Sphaeranthus indicus and Garcinia mangostana (combinations of these and #1 can be found in capsicum and zychrome  3) green coffee bean extract 400mg  twice per day or Irvingia (african mango) 150 to 300mg  twice per day.

## 2022-07-16 NOTE — Progress Notes (Signed)
Subjective:    Patient ID: Ashley Lindsey, female    DOB: 10-02-1960, 62 y.o.   MRN: [id number]  HPI Ashley Lindsey is a 62 year old woman who presents for follow-up of lymphedema and fibromyalgia  1) LUE lymphedema -garment was too expensive -when it flares it gives her no warning -she received a booklet and she does the therapy and sometimes it works and sometimes it doesn't -worse in the biceps -aches and wakes her up  2) Ailene Ards -working out in [employer] -up and down with emotions -had to put her uncle in a nursing home and this circles her -she is cleaning out her uncle's room -she has had more flares recently.   Pain Inventory Average Pain 8 Pain Right Now 8 My pain is constant, dull, and aching  In the last 24 hours, has pain interfered with the following? General activity 9 Relation with others 9 Enjoyment of life 9 What TIME of day is your pain at its worst? varies Sleep (in general) Fair  Pain is worse with: walking, sitting, inactivity, standing, and some activites Pain improves with: rest, heat/ice, therapy/exercise, pacing activities, medication, and injections Relief from Meds: 9  Family History  Problem Relation Age of Onset   Breast cancer Mother 68   Heart disease Father    Cancer Maternal Aunt 27       d. from "female cancer" possibly cervical   Breast cancer Maternal Aunt 60   Prostate cancer Maternal Uncle 78   Prostate cancer Maternal Uncle 80   Prostate cancer Paternal Uncle    Prostate cancer Paternal Uncle    Prostate cancer Maternal Grandfather 83   Breast cancer Cousin 36       mat 1st cousin   Social History   Socioeconomic History   Marital status: Divorced    Spouse name: Not on file   Number of children: Not on file   Years of education: Not on file   Highest education level: Not on file  Occupational History   Occupation: disability  Tobacco Use   Smoking status: Former    Packs/day: 0.25    Years:  1.00    Additional pack years: 0.00    Total pack years: 0.25    Types: Cigarettes   Smokeless tobacco: Never  Vaping Use   Vaping Use: Never used  Substance and Sexual Activity   Alcohol use: Not Currently   Drug use: No   Sexual activity: Not Currently    Birth control/protection: Surgical  Other Topics Concern   Not on file  Social History Narrative   Right handed    Coffee daily, sometimes Ginger ale    Social Determinants of Health   Financial Resource Strain: Low Risk  (02/08/2022)   Overall Financial Resource Strain (CARDIA)    Difficulty of Paying Living Expenses: Not hard at all  Food Insecurity: No Food Insecurity (02/08/2022)   Hunger Vital Sign    Worried About Running Out of Food in the Last Year: Never true    Ran Out of Food in the Last Year: Never true  Transportation Needs: No Transportation Needs (02/08/2022)   PRAPARE - Hydrologist (Medical): No    Lack of Transportation (Non-Medical): No  Physical Activity: Inactive (02/08/2022)   Exercise Vital Sign    Days of Exercise per Week: 0 days    Minutes of Exercise per Session: 0 min  Stress: No Stress Concern Present (02/08/2022)  [place] Stress Questionnaire    Feeling of Stress : Not at all  Social Connections: Not on file   Past Surgical History:  Procedure Laterality Date   ABDOMINAL HYSTERECTOMY     with left salpingooophorectomy   BIOPSY  04/30/2022   Procedure: BIOPSY;  Surgeon: Doran Stabler, MD;  [facility]: [facility];  Service: Gastroenterology;;   BREAST LUMPECTOMY  2009/2012   LEFT/RIGHT with lymph node dissection   COLONOSCOPY WITH PROPOFOL N/A 04/30/2022   Procedure: COLONOSCOPY WITH PROPOFOL;  Surgeon: Doran Stabler, MD;  [facility]: [facility];  Service: Gastroenterology;  Laterality: N/A;   ESOPHAGOGASTRODUODENOSCOPY (EGD) WITH PROPOFOL N/A 04/30/2022   Procedure: ESOPHAGOGASTRODUODENOSCOPY (EGD) WITH  PROPOFOL;  Surgeon: Doran Stabler, MD;  [facility];  Service: Gastroenterology;  Laterality: N/A;   HOT HEMOSTASIS N/A 04/30/2022   Procedure: HOT HEMOSTASIS (ARGON PLASMA COAGULATION/BICAP);  Surgeon: Doran Stabler, MD;  Location: [facility];  Service: Gastroenterology;  Laterality: N/A;   KNEE ARTHROSCOPY     right   OOPHORECTOMY  2013   Past Surgical History:  Procedure Laterality Date   ABDOMINAL HYSTERECTOMY     with left salpingooophorectomy   BIOPSY  04/30/2022   Procedure: BIOPSY;  Surgeon: Doran Stabler, MD;  [facility];  Service: Gastroenterology;;   BREAST LUMPECTOMY  2009/2012   LEFT/RIGHT with lymph node dissection   COLONOSCOPY WITH PROPOFOL N/A 04/30/2022   Procedure: COLONOSCOPY WITH PROPOFOL;  Surgeon: Doran Stabler, MD;  [facility];  Service: Gastroenterology;  Laterality: N/A;   ESOPHAGOGASTRODUODENOSCOPY (EGD) WITH PROPOFOL N/A 04/30/2022   Procedure: ESOPHAGOGASTRODUODENOSCOPY (EGD) WITH PROPOFOL;  Surgeon: Doran Stabler, MD;  [facility];  Service: Gastroenterology;  Laterality: N/A;   HOT HEMOSTASIS N/A 04/30/2022   Procedure: HOT HEMOSTASIS (ARGON PLASMA COAGULATION/BICAP);  Surgeon: Doran Stabler, MD;  Location: [facility];  Service: Gastroenterology;  Laterality: N/A;   KNEE ARTHROSCOPY     right   OOPHORECTOMY  2013   Past Medical History:  Diagnosis Date   Abdominal cramps    Anxiety    Arthritis    Cancer ([place])    s/p radiation- last dose 6/12//has been off Tamoxifen 8/12   Chronic headaches    Costochondritis    Fatigue    Fibromyalgia 03/06/2013   GERD (gastroesophageal reflux disease)    History of COVID-19    Hypertension    Lymphedema    RIGHT ARM-////  STATES USE LEFT ARM FOR BP'S   Migraines    Muscle spasms of head and/or neck    following to the breast   Passed out    4th of july   Personal history of radiation therapy 2010   lt breast    S/P radiation therapy  08/19/07 - 10/10/07   Left Breast/5040 cGy/28 fractions with Boost for a toatl dose of 6300 dGy   S/P radiation therapy 08/14/10 -10/03/10   right breast   Sleep apnea    STOP BANG SCORE 5   Ht 5\' 2"  (1.575 m)   Wt 274 lb (124.3 kg)   BMI 50.12 kg/m   Opioid Risk Score:   Fall Risk Score:  `1  Depression screen [facility] 2/9     02/08/2022   10:11 AM 01/16/2022    9:14 AM 12/19/2021   10:39 AM 07/06/2021   10:17 AM 02/02/2021    9:45 AM 01/11/2021    2:05 PM 12/02/2019  2:55 PM  Depression screen PHQ 2/9  Decreased Interest 0 0 0 1 1 0 1  Down, Depressed, Hopeless 0 0 0 1 1 0 1  PHQ - 2 Score 0 0 0 2 2 0 2  Altered sleeping       1  Tired, decreased energy       3  Change in appetite       1  Feeling bad or failure about yourself        0  Trouble concentrating       3  Moving slowly or fidgety/restless       0  Suicidal thoughts       0  PHQ-9 Score       10  Difficult doing work/chores       Somewhat difficult     Review of Systems  Musculoskeletal:  Positive for neck pain.       B/L hips knee finger tip pain RT shoulder/arm  All other systems reviewed and are negative.      Objective:   Physical Exam Gen: no distress, normal appearing HEENT: oral mucosa pink and moist, NCAT Cardio: Reg rate Chest: normal effort, normal rate of breathing Abd: soft, non-distended Ext: no edema Psych: pleasant, normal affect Skin: intact Neuro: Alert and oriented x3, diffuse tenderness on right and left side of body       Assessment & Plan:   1) LUE lymphedema -will give script for garment -commended her on doing her lymphedema exercises -continue tiger balm  2) Fibromyalgia -discussed her family stressors -continue tylenol -continue gabapentin, discussed can double of dose for severe flares.  -Discussed current symptoms of pain and history of pain.  -Discussed benefits of exercise in reducing pain. -Discussed following foods that may reduce pain: 1) Ginger (especially  studied for arthritis)- reduce leukotriene production to decrease inflammation 2) Blueberries- high in phytonutrients that decrease inflammation 3) Salmon- marine omega-3s reduce joint swelling and pain 4) Pumpkin seeds- reduce inflammation 5) dark chocolate- reduces inflammation 6) turmeric- reduces inflammation 7) tart cherries - reduce pain and stiffness 8) extra virgin olive oil - its compound olecanthal helps to block prostaglandins  9) chili peppers- can be eaten or applied topically via capsaicin 10) mint- helpful for headache, muscle aches, joint pain, and itching 11) garlic- reduces inflammation  Link to further information on diet for chronic pain: [email or web address]   3) Right 5th digit DIP OA: -XR ordered  4) Bilateral knee pain -RF and ANA ordered.   5) Obesity: -Educated that current weight is 274 lbs and current BMI is 50.12 Discussed following criteria for Wegovy: 1) Patient has diagnosis of obesity; 2) Patient must be 64 years of age or older; 3) The patient has been involved in a physician or dietitian monitored weight loss program, consisting of both low-calorie diet, increased physical activity and behavioral counseling for a minimum of 6 months without a 3% loss from baseline; 4) Patients BMI is one of the following: a) 30kg/m or greater; b) 27 29.99 kg/m in the presence of at least one weight related comorbidity such as coronary heart disease, dyslipidemia, hypertension, symptomatic arthritis of the lower extremities, type 2 diabetes mellitus, obstructive sleep apnea; c) 25-29.9 kg/m2 and waist circumference is &gt; 40 inches for males or &gt; 35 inches for females;5) Not Met - Must have tried and failed at least one preferred oral agent such as Phentermine; 6) Patient has no labeled contraindication; 7) Patient is not taking another weight loss  medication; 8) The dose is within approved product  labeling guidelines; 10) The patient must not be taking another GLP-1 receptor agonist AND the patient must not be taking insulin concurrently.  -prescribed [id number] -recommended resistance training and prioritizing protein intake to avoid muscle loss while taking this medication -discussed risks of thyroid cancer and gastroparesis  -Educated regarding health benefits of weight loss- for pain, general health, chronic disease prevention, immune health, mental health.  -Will monitor weight every visit.  -Consider Roobois tea daily.  -Discussed the benefits of intermittent fasting. -Discussed foods that can assist in weight loss: 1) leafy greens- high in fiber and nutrients 2) dark chocolate- improves metabolism (if prefer sweetened, best to sweeten with honey instead of sugar).  3) cruciferous vegetables- high in fiber and protein 4) full fat yogurt: high in healthy fat, protein, calcium, and probiotics 5) apples- high in a variety of phytochemicals 6) nuts- high in fiber and protein that increase feelings of fullness 7) grapefruit: rich in nutrients, antioxidants, and fiber (not to be taken with anticoagulation) 8) beans- high in protein and fiber 9) salmon- has high quality protein and healthy fats 10) green tea- rich in polyphenols 11) eggs- rich in choline and vitamin D 12) tuna- high protein, boosts metabolism 13) avocado- decreases visceral abdominal fat 14) chicken (pasture raised): high in protein and iron 15) blueberries- reduce abdominal fat and cholesterol 16) whole grains- decreases calories retained during digestion, speeds metabolism 17) chia seeds- curb appetite 18) chilies- increases fat metabolism  -Discussed supplements that can be used:  1) Metatrim 400mg  BID 30 minutes before breakfast and dinner  2) Sphaeranthus indicus and Garcinia mangostana (combinations of these and #1 can be found in capsicum and zychrome  3) green coffee bean extract 400mg  twice per day or  Irvingia (african mango) 150 to 300mg  twice per day.  6) Iron deficiency anemia: -discussed that when she supplemented she felt improved energy.   >40 minutes spent in discussion of her fibromyalgia, iron deficiency anemia, doubling up on gabapentin to help for fibromyalgia, lymphedema garment, discussed that she has already done lymphedema therapy, discussed medications that can help with chronic pain and diabetes, discussed Wegovy.

## 2022-07-17 ENCOUNTER — Encounter: Payer: Self-pay | Admitting: Physical Medicine and Rehabilitation

## 2022-07-17 LAB — ANA: Anti Nuclear Antibody (ANA): NEGATIVE

## 2022-07-17 LAB — RHEUMATOID FACTOR: Rheumatoid fact SerPl-aCnc: 11.3 IU/mL (ref <14.0)

## 2022-07-20 ENCOUNTER — Other Ambulatory Visit: Payer: Self-pay | Admitting: Cardiology

## 2022-07-20 ENCOUNTER — Other Ambulatory Visit: Payer: Self-pay | Admitting: Nurse Practitioner

## 2022-07-20 ENCOUNTER — Other Ambulatory Visit: Payer: Self-pay | Admitting: Physical Medicine and Rehabilitation

## 2022-07-20 DIAGNOSIS — I429 Cardiomyopathy, unspecified: Secondary | ICD-10-CM

## 2022-07-20 DIAGNOSIS — M797 Fibromyalgia: Secondary | ICD-10-CM

## 2022-07-26 ENCOUNTER — Encounter: Payer: Self-pay | Admitting: Gastroenterology

## 2022-07-26 ENCOUNTER — Ambulatory Visit (INDEPENDENT_AMBULATORY_CARE_PROVIDER_SITE_OTHER): Payer: 59 | Admitting: Gastroenterology

## 2022-07-26 VITALS — BP 110/68 | HR 68 | Ht 62.0 in | Wt 279.0 lb

## 2022-07-26 DIAGNOSIS — K5909 Other constipation: Secondary | ICD-10-CM | POA: Diagnosis not present

## 2022-07-26 DIAGNOSIS — K257 Chronic gastric ulcer without hemorrhage or perforation: Secondary | ICD-10-CM | POA: Diagnosis not present

## 2022-07-26 DIAGNOSIS — D509 Iron deficiency anemia, unspecified: Secondary | ICD-10-CM

## 2022-07-26 NOTE — Patient Instructions (Signed)
_______________________________________________________  If your blood pressure at your visit was 140/90 or greater, please contact your primary care physician to follow up on this.  _______________________________________________________  If you are age 64 or older, your body mass index should be between 23-30. Your Body mass index is 51.03 kg/m. If this is out of the aforementioned range listed, please consider follow up with your Primary Care Provider.  If you are age 3 or younger, your body mass index should be between 19-25. Your Body mass index is 51.03 kg/m. If this is out of the aformentioned range listed, please consider follow up with your Primary Care Provider.   ________________________________________________________  The [facility] providers would like to encourage you to use [facility] to communicate with providers for non-urgent requests or questions.  Due to long hold times on the telephone, sending your provider a message by [facility] may be a faster and more efficient way to get a response.  Please allow 48 business hours for a response.  Please remember that this is for non-urgent requests.  _______________________________________________________  It was a pleasure to see you today!  Thank you for trusting me with your gastrointestinal care!

## 2022-07-26 NOTE — Progress Notes (Signed)
[facility] Progress Note  Chief Complaint: Iron deficiency anemia, history of gastric ulcer  Subjective  History: Follow-up for iron deficiency anemia and NSAID induced shallow gastric ulcer discovered on 04/30/2022.  Patient had been decreasing her intake of Excedrin Migraine OTC medicine prior to that.  She received 2 IV iron treatments between the November 2023 office consult and the procedures. Colonoscopy same day January 2024, markedly redundant colon, diverticulosis, no polyps, fair prep in some areas, 5-year recall recommended. ____________  Vivien Rota says she is mainly having problems with arthritic and back pain as well as lymphedema in the left arm for which she has been seeing her primary care provider.  She takes some iron Gummies because other iron tablets worsen her constipation.  She has been taking MiraLAX and stool softeners as needed.  No show for clinic visits Jan and Feb 2024 (late arrival today) ROS: Cardiovascular:  no chest pain Respiratory: no dyspnea  The patient's Past Medical, Family and Social History were reviewed and are on file in the EMR.  Objective:  Med list reviewed  Current Outpatient Medications:    acetaminophen (TYLENOL) 650 MG CR tablet, Take 650 mg by mouth every 8 (eight) hours as needed for pain., Disp: , Rfl:    albuterol (VENTOLIN HFA) 108 (90 Base) MCG/ACT inhaler, INHALE 1-2 PUFFS INTO THE LUNGS EVERY 6 (SIX) HOURS AS NEEDED FOR WHEEZING OR SHORTNESS OF BREATH., Disp: 6.7 each, Rfl: 1   atorvastatin (LIPITOR) 10 MG tablet, TAKE 1 TABLET BY MOUTH EVERY DAY AT NIGHT, Disp: 90 tablet, Rfl: 1   benzonatate (TESSALON PERLES) 100 MG capsule, Take 1 capsule (100 mg total) by mouth every 6 (six) hours as needed., Disp: 30 capsule, Rfl: 1   Bisacodyl (DULCOLAX PO), Take 1 capsule by mouth daily as needed (constipation). , Disp: , Rfl:    cholecalciferol 5000 units TABS, Take 1 tablet (5,000 Units total) by mouth 2 (two) times daily. (Patient  taking differently: Take 5,000 Units by mouth daily.), Disp: , Rfl:    cyclobenzaprine (FLEXERIL) 10 MG tablet, Take 1 tablet (10 mg total) by mouth 3 (three) times daily., Disp: 90 tablet, Rfl: 1   dexlansoprazole (DEXILANT) 60 MG capsule, TAKE 1 CAPSULE BY MOUTH EVERY DAY, Disp: 90 capsule, Rfl: 1   diclofenac Sodium (VOLTAREN) 1 % GEL, Apply 2 g topically 4 (four) times daily. (Patient taking differently: Apply 2 g topically in the morning and at bedtime.), Disp: 2 g, Rfl: 3   diphenhydrAMINE (BENADRYL) 25 MG tablet, Take 1 tablet (25 mg total) by mouth every 6 (six) hours as needed for up to 20 doses (migraine). Take with reglan for migraine headache, Disp: 20 tablet, Rfl: 0   ELDERBERRY PO, Take 1 tablet by mouth daily., Disp: , Rfl:    ENTRESTO 24-26 MG, TAKE 1 TABLET BY MOUTH TWICE A DAY, Disp: 180 tablet, Rfl: 0   ferrous sulfate 324 (65 Fe) MG TBEC, Take 325 mg by mouth 2 (two) times daily., Disp: , Rfl:    fish oil-omega-3 fatty acids 1000 MG capsule, Take 1 g by mouth daily., Disp: , Rfl:    Flaxseed, Linseed, (FLAXSEED OIL MAX STR PO), Take 1 capsule by mouth daily. , Disp: , Rfl:    gabapentin (NEURONTIN) 300 MG capsule, TAKE 1 CAPSULE BY MOUTH THREE TIMES A DAY, Disp: 90 capsule, Rfl: 3   losartan (COZAAR) 25 MG tablet, Take by mouth daily., Disp: , Rfl:    Magnesium 100 MG TABS, Take  1 tablet by mouth daily. Takes 50 mg, Disp: , Rfl:    meloxicam (MOBIC) 7.5 MG tablet, TAKE 1 TABLET BY MOUTH EVERY DAY, Disp: 90 tablet, Rfl: 0   Multiple Vitamins-Minerals (MULTIVITAMIN & MINERAL PO), Take 1 tablet by mouth daily., Disp: , Rfl:    mupirocin ointment (BACTROBAN) 2 %, Apply 1 Application topically 2 (two) times daily. (Patient taking differently: Apply 1 Application topically as needed.), Disp: 22 g, Rfl: 0   ondansetron (ZOFRAN) 4 MG tablet, TAKE 1 TABLET BY MOUTH DAILY AS NEEDED FOR NAUSEA OR VOMITING, Disp: 30 tablet, Rfl: 1   OVER THE COUNTER MEDICATION, Take 125 mg by mouth daily.  [place]/Simethicone, Disp: , Rfl:    polyethylene glycol (MIRALAX / GLYCOLAX) 17 g packet, Take 17 g by mouth daily as needed., Disp: , Rfl:    propranolol (INDERAL) 10 MG tablet, TAKE 1 TABLET BY MOUTH TWICE A DAY *HOLD IF SYSTOLIC BP (TOP #) IS 123456 OR PULSE IS <60BPM (Patient taking differently: Take 10 mg by mouth at bedtime.), Disp: 180 tablet, Rfl: 1   spironolactone (ALDACTONE) 25 MG tablet, TAKE 1 TABLET BY MOUTH EVERY DAY IN THE MORNING, Disp: 90 tablet, Rfl: 0   SYMBICORT 80-4.5 MCG/ACT inhaler, INHALE 2 PUFFS INTO THE LUNGS IN THE MORNING AND AT BEDTIME., Disp: 30.6 each, Rfl: 1   topiramate (TOPAMAX) 25 MG tablet, Take 1 tablet (25 mg total) by mouth 2 (two) times daily., Disp: 180 tablet, Rfl: 3   triamcinolone cream (KENALOG) 0.1 %, APPLY 1 APPLICATION TOPICALLY 2 (TWO) TIMES DAILY AS NEEDED (RASH)., Disp: 60 g, Rfl: 1   Turmeric 500 MG CAPS, Take by mouth., Disp: , Rfl:    venlafaxine XR (EFFEXOR-XR) 150 MG 24 hr capsule, TAKE 1 CAPSULE BY MOUTH NIGHTLY WITH 75MG  CAPSULE FOR A TOTAL OF 225MG  NIGHTLY, Disp: 90 capsule, Rfl: 3   venlafaxine XR (EFFEXOR-XR) 75 MG 24 hr capsule, Total dose 225 mg, Disp: 90 capsule, Rfl: 3   Vitamin D, Ergocalciferol, (DRISDOL) 1.25 MG (50000 UNIT) CAPS capsule, TAKE 1 CAPSULE BY MOUTH EVERY 7 DAYS, Disp: 20 capsule, Rfl: 0   vitamin E 200 UNIT capsule, Take 200 Units by mouth daily., Disp: , Rfl:    [START ON 08/14/2022] Semaglutide-Weight Management 0.5 MG/0.5ML SOAJ, Inject 0.5 mg into the skin once a week for 28 days. (Patient not taking: Reported on 07/26/2022), Disp: 2 mL, Rfl: 0   [START ON 09/12/2022] Semaglutide-Weight Management 1 MG/0.5ML SOAJ, Inject 1 mg into the skin once a week for 28 days. (Patient not taking: Reported on 07/26/2022), Disp: 2 mL, Rfl: 0   [START ON 10/11/2022] Semaglutide-Weight Management 1.7 MG/0.75ML SOAJ, Inject 1.7 mg into the skin once a week for 28 days. (Patient not taking: Reported on 07/26/2022), Disp: 3 mL, Rfl: 0    [START ON 11/09/2022] Semaglutide-Weight Management 2.4 MG/0.75ML SOAJ, Inject 2.4 mg into the skin once a week for 28 days. (Patient not taking: Reported on 07/26/2022), Disp: 3 mL, Rfl: 0   WEGOVY 0.25 MG/0.5ML SOAJ, INJECT 0.25 MG INTO THE SKIN ONCE A WEEK FOR 28 DAYS (Patient not taking: Reported on 07/26/2022), Disp: 2 mL, Rfl: 0   Vital signs in last 24 hrs: Vitals:   07/26/22 0935  BP: 110/68  Pulse: 68  SpO2: 98%   Wt Readings from Last 3 Encounters:  07/26/22 279 lb (126.6 kg)  07/16/22 274 lb (124.3 kg)  07/10/22 274 lb 6.4 oz (124.5 kg)    Physical Exam   Cardiac:  Regular without appreciable murmur,  no peripheral edema Pulm: clear to auscultation bilaterally, normal RR and effort noted Abdomen: soft, obese, no tenderness, with active bowel sounds.  Rectus diastasis or abdominal wall hernia (difficult to determine body habitus) Skin; warm and dry, no jaundice or rash  Labs:  Iron/TIBC/Ferritin/ %Sat    Component Value Date/Time   IRON 38 (L) 07/05/2022 1215   IRON 26 (L) 03/05/2022 1110   TIBC 362.6 07/05/2022 1215   TIBC 395 03/05/2022 1110   FERRITIN 19.3 07/05/2022 1215   FERRITIN 11 (L) 03/05/2022 1110   IRONPCTSAT 10.5 (L) 07/05/2022 1215   IRONPCTSAT 7 (LL) 03/05/2022 1110      Latest Ref Rng & Units 07/05/2022   12:15 PM 04/12/2022    4:13 PM 03/05/2022   11:10 AM  CBC  WBC 4.0 - 10.5 K/uL 8.9  9.6  10.0   Hemoglobin 12.0 - 15.0 g/dL 12.6  11.0  8.2   Hematocrit 36.0 - 46.0 % 39.3  36.7  29.2   Platelets 150.0 - 400.0 K/uL 351.0  293.0  259     ___________________________________________ Radiologic studies:   ____________________________________________ Other:   _____________________________________________ Assessment & Plan  Assessment: Encounter Diagnoses  Name Primary?   Iron deficiency anemia, unspecified iron deficiency anemia type Yes   Chronic constipation    Chronic gastric ulcer without hemorrhage and without perforation    IDA  from aspirin induced gastric ulcer, suspect that is completely healed at this point as patient no longer uses aspirin or NSAIDs.  (She had been taking Excedrin regularly for arthritic pain)  Chronic functional constipation, worsened when she takes iron supplements.   Plan: Iron level nearly normalized and CBC normal 2 weeks ago. Follow-up with primary care for iron studies and CBC in 2 to 3 months.  If things are normal at that point, I think she can stop iron supplements.  She will continue to use stool softeners and MiraLAX as needed for constipation.  See me as needed    Nelida Meuse III

## 2022-07-27 ENCOUNTER — Encounter: Payer: Self-pay | Admitting: Physician Assistant

## 2022-07-27 ENCOUNTER — Telehealth: Payer: Self-pay

## 2022-07-27 ENCOUNTER — Ambulatory Visit (INDEPENDENT_AMBULATORY_CARE_PROVIDER_SITE_OTHER): Payer: 59 | Admitting: Physician Assistant

## 2022-07-27 DIAGNOSIS — M1712 Unilateral primary osteoarthritis, left knee: Secondary | ICD-10-CM | POA: Diagnosis not present

## 2022-07-27 DIAGNOSIS — M1711 Unilateral primary osteoarthritis, right knee: Secondary | ICD-10-CM | POA: Diagnosis not present

## 2022-07-27 DIAGNOSIS — M170 Bilateral primary osteoarthritis of knee: Secondary | ICD-10-CM

## 2022-07-27 MED ORDER — BUPIVACAINE HCL 0.25 % IJ SOLN
2.0000 mL | INTRAMUSCULAR | Status: AC | PRN
  Administered 2022-07-27: 2 mL via INTRA_ARTICULAR

## 2022-07-27 MED ORDER — LIDOCAINE HCL 1 % IJ SOLN
2.0000 mL | INTRAMUSCULAR | Status: AC | PRN
Start: 2022-07-27 — End: 2022-07-27
  Administered 2022-07-27: 2 mL

## 2022-07-27 MED ORDER — METHYLPREDNISOLONE ACETATE 40 MG/ML IJ SUSP
80.0000 mg | INTRAMUSCULAR | Status: AC | PRN
Start: 2022-07-27 — End: 2022-07-27
  Administered 2022-07-27: 80 mg via INTRA_ARTICULAR

## 2022-07-27 NOTE — Telephone Encounter (Signed)
Received [facility] pt 07/26/22 reference missed class and that she was going to miss more days due to being out of town (up to 7 days).  Call to patient and left a message for her I can put her in the next class and call me when she is ready to do the program and I'll let her know that classes available and she can chose which class she thinks she can do.

## 2022-07-27 NOTE — Progress Notes (Signed)
Office Visit Note   Patient: Ashley Lindsey           Date of Birth: 05/12/1960           MRN: [id number] Visit Date: 07/27/2022              Requested by: [name], FNP [address] [place],  [place] [address] PCP: [name], FNP  Chief Complaint  Patient presents with  . Left Knee - Pain      HPI: Patient comes in to requesting bilateral steroid injections into her knees.  She has a history of arthritis and has been helped with the injections before.  No new injuries.  Assessment & Plan: Visit Diagnoses: Osteoarthritis bilateral knees  Plan: Bilateral knees injected today she may follow-up as needed  Follow-Up Instructions: No follow-ups on file.   Ortho Exam  Patient is alert, oriented, no adenopathy, well-dressed, normal affect, normal respiratory effort. Examination of her knees no effusion no redness compartments are soft and nontender pulses are intact  Imaging: No results found. No images are attached to the encounter.  Labs: Lab Results  Component Value Date   HGBA1C 5.8 (H) 03/05/2022   HGBA1C 5.9 (H) 10/16/2021   HGBA1C 6.6 (H) 02/08/2021   ESRSEDRATE 48 (H) 08/26/2019   LABURIC 4.8 08/26/2019   REPTSTATUS 06/12/2017 FINAL 06/10/2017   CULT >=100,000 COLONIES/mL ESCHERICHIA COLI (A) 06/10/2017   LABORGA ESCHERICHIA COLI (A) 06/10/2017     Lab Results  Component Value Date   ALBUMIN 4.3 03/29/2022   ALBUMIN 4.2 03/05/2022   ALBUMIN 4.6 02/08/2021    Lab Results  Component Value Date   MG 1.9 01/16/2022   MG 2.0 02/17/2020   Lab Results  Component Value Date   VD25OH 41.6 01/16/2022   VD25OH 32 12/26/2011   VD25OH 55 06/23/2010    No results found for: "PREALBUMIN"    Latest Ref Rng & Units 07/05/2022   12:15 PM 04/12/2022    4:13 PM 03/05/2022   11:10 AM  CBC EXTENDED  WBC 4.0 - 10.5 K/uL 8.9  9.6  10.0   RBC 3.87 - 5.11 Mil/uL 4.81  4.83  4.26   Hemoglobin 12.0 - 15.0 g/dL 95.7  47.3  8.2   HCT 40.3 -  46.0 % 39.3  36.7  29.2   Platelets 150.0 - 400.0 K/uL 351.0  293.0  259   NEUT# 1.4 - 7.7 K/uL 5.4  6.2    Lymph# 0.7 - 4.0 K/uL 2.7  2.6       There is no height or weight on file to calculate BMI.  Orders:  No orders of the defined types were placed in this encounter.  No orders of the defined types were placed in this encounter.    Procedures: Large Joint Inj: bilateral knee on 07/27/2022 3:45 PM Indications: pain and diagnostic evaluation Details: 25 G 1.5 in needle, anteromedial approach  Arthrogram: No  Medications (Right): 2 mL lidocaine 1 %; 2 mL bupivacaine 0.25 %; 80 mg methylPREDNISolone acetate 40 MG/ML Medications (Left): 2 mL lidocaine 1 %; 2 mL bupivacaine 0.25 %; 80 mg methylPREDNISolone acetate 40 MG/ML Outcome: tolerated well, no immediate complications Procedure, treatment alternatives, risks and benefits explained, specific risks discussed. Consent was given by the patient. Immediately prior to procedure a time out was called to verify the correct patient, procedure, equipment, support staff and site/side marked as required. Patient was prepped and draped in the usual sterile fashion.  Clinical Data: No additional findings.  ROS:  All other systems negative, except as noted in the HPI. Review of Systems  Objective: Vital Signs: There were no vitals taken for this visit.  Specialty Comments:  No specialty comments available.  PMFS History: Patient Active Problem List   Diagnosis Date Noted  . Suboptimal nutrition 07/05/2022  . Cardiomyopathy, unspecified type 07/05/2022  . Essential hypertension 07/05/2022  . Type 2 diabetes mellitus with diabetic dermatitis, without long-term current use of insulin 07/05/2022  . Chronic gastric ulcer without hemorrhage and without perforation 04/30/2022  . Gastric AVM 04/30/2022  . Iron deficiency anemia 03/06/2022  . Unilateral primary osteoarthritis, left knee 10/02/2021  . Pulmonary nodules 07/12/2021  .  Chronic cough 07/12/2021  . Avulsion fracture of middle phalanx of finger 04/18/2021  . Tibial plateau fracture, left 04/18/2021  . Asymmetrical sensorineural hearing loss 02/02/2021  . Bilateral primary osteoarthritis of knee 02/09/2020  . Chronic intermittent hypoxia with obstructive sleep apnea 08/24/2019  . Chronic intractable headache 07/14/2019  . Morning headache 07/14/2019  . Loud snoring 07/14/2019  . Super obesity 07/14/2019  . Sleeps in sitting position due to orthopnea 07/14/2019  . Chest pain due to GERD 07/14/2019  . Nocturia more than twice per night 07/14/2019  . Unilateral primary osteoarthritis, right knee 05/26/2019  . Chronic migraine without aura without status migrainosus, not intractable 05/13/2019  . Class 3 severe obesity due to excess calories without serious comorbidity with body mass index (BMI) of 40.0 to 44.9 in adult 08/05/2018  . Depression 08/05/2018  . Chronic nonintractable headache 08/05/2018  . Elevated blood-pressure reading without diagnosis of hypertension 06/06/2018  . Chronic pain of right ankle 04/24/2018  . Breast cancer of lower-outer quadrant of right female breast (HCC) 12/02/2014  . Bronchospasm 09/21/2014  . Dyspnea 09/21/2014  . Chronic pain of right knee 09/21/2014  . Fibromyalgia 03/06/2013  . Dense breasts 03/06/2013  . Mastalgia 03/06/2013  . Back pain, lumbosacral 03/06/2013  . Lymphedema of arm 03/06/2013  . Atypical chest pain 01/06/2013  . Breast cancer of upper-inner quadrant of left female breast (HCC) 09/08/2012  . Family history of malignant neoplasm of breast 09/08/2012  . S/P radiation therapy   . History of breast cancer in female 08/13/2011   Past Medical History:  Diagnosis Date  . Abdominal cramps   . Anxiety   . Arthritis   . breast cancer    s/p radiation- last dose 6/12//has been off Tamoxifen 8/12  . Chronic headaches   . Costochondritis   . Fatigue   . Fibromyalgia 03/06/2013  . GERD  (gastroesophageal reflux disease)   . History of COVID-19   . Hypertension   . Lymphedema    RIGHT ARM-////  STATES USE LEFT ARM FOR BP'S  . Migraines   . Muscle spasms of head and/or neck    following to the breast  . Passed out    4th of july  . Personal history of radiation therapy 2010   lt breast   . S/P radiation therapy 08/19/07 - 10/10/07   Left Breast/5040 cGy/28 fractions with Boost for a toatl dose of 6300 dGy  . S/P radiation therapy 08/14/10 -10/03/10   right breast  . Sleep apnea    STOP BANG SCORE 5    Family History  Problem Relation Age of Onset  . Breast cancer Mother 55  . Heart disease Father   . Diabetes Sister   . Prostate cancer Maternal Grandfather 56  . Cancer Maternal  Aunt 63       d. from "female cancer" possibly cervical  . Breast cancer Maternal Aunt 60  . Prostate cancer Maternal Uncle 78  . Prostate cancer Maternal Uncle 80  . Prostate cancer Paternal Uncle   . Prostate cancer Paternal Uncle   . Breast cancer Cousin 30       mat 1st cousin    Past Surgical History:  Procedure Laterality Date  . ABDOMINAL HYSTERECTOMY     with left salpingooophorectomy  . BIOPSY  04/30/2022   Procedure: BIOPSY;  Surgeon: Sherrilyn Ristanis, Henry L III, MD;  Location: [facility];  Service: Gastroenterology;;  . BREAST LUMPECTOMY  2009/2012   LEFT/RIGHT with lymph node dissection  . COLONOSCOPY WITH PROPOFOL N/A 04/30/2022   Procedure: COLONOSCOPY WITH PROPOFOL;  Surgeon: Sherrilyn Ristanis, Henry L III, MD;  Location: [facility];  Service: Gastroenterology;  Laterality: N/A;  . ESOPHAGOGASTRODUODENOSCOPY (EGD) WITH PROPOFOL N/A 04/30/2022   Procedure: ESOPHAGOGASTRODUODENOSCOPY (EGD) WITH PROPOFOL;  Surgeon: Sherrilyn Ristanis, Henry L III, MD;  Location: [facility];  Service: Gastroenterology;  Laterality: N/A;  . HOT HEMOSTASIS N/A 04/30/2022   Procedure: HOT HEMOSTASIS (ARGON PLASMA COAGULATION/BICAP);  Surgeon: Sherrilyn Ristanis, Henry L III, MD;  [facility];  Service: Gastroenterology;   Laterality: N/A;  . KNEE ARTHROSCOPY     right  . OOPHORECTOMY  2013   Social History   Occupational History  . Occupation: disability  Tobacco Use  . Smoking status: Former    Packs/day: 0.25    Years: 1.00    Additional pack years: 0.00    Total pack years: 0.25    Types: Cigarettes  . Smokeless tobacco: Never  Vaping Use  . Vaping Use: Never used  Substance and Sexual Activity  . Alcohol use: Not Currently  . Drug use: No  . Sexual activity: Not Currently    Birth control/protection: Surgical

## 2022-07-30 ENCOUNTER — Other Ambulatory Visit: Payer: 59

## 2022-08-01 ENCOUNTER — Other Ambulatory Visit: Payer: Self-pay | Admitting: *Deleted

## 2022-08-01 MED ORDER — GABAPENTIN 300 MG PO CAPS: ORAL_CAPSULE | ORAL | 3 refills | Status: DC

## 2022-08-06 ENCOUNTER — Telehealth: Payer: Self-pay | Admitting: *Deleted

## 2022-08-06 ENCOUNTER — Ambulatory Visit: Payer: 59 | Admitting: Cardiology

## 2022-08-06 NOTE — Telephone Encounter (Signed)
Ashley Lindsey (Key: [facility]) - [id number] PRXYVO 1MG /0.5ML auto-injectors Status: PA RequestCreated: April 15th, 2024Sent: April 15th, 2024

## 2022-08-07 NOTE — Telephone Encounter (Signed)
Ashley Lindsey (Key: [facility]) - [id number] 1MG /0.5ML auto-injectors Status: PA Response - DeniedCreated: April 15th, 2024Sent: April 15th, 2024

## 2022-08-13 ENCOUNTER — Encounter: Payer: 59 | Attending: Physical Medicine and Rehabilitation | Admitting: Physical Medicine and Rehabilitation

## 2022-08-13 DIAGNOSIS — G8929 Other chronic pain: Secondary | ICD-10-CM

## 2022-08-13 DIAGNOSIS — M5417 Radiculopathy, lumbosacral region: Secondary | ICD-10-CM | POA: Diagnosis not present

## 2022-08-13 DIAGNOSIS — M25561 Pain in right knee: Secondary | ICD-10-CM | POA: Diagnosis not present

## 2022-08-13 DIAGNOSIS — Z6841 Body Mass Index (BMI) 40.0 and over, adult: Secondary | ICD-10-CM

## 2022-08-13 DIAGNOSIS — M25562 Pain in left knee: Secondary | ICD-10-CM

## 2022-08-13 MED ORDER — TOPIRAMATE 50 MG PO TABS: 50.0000 mg | ORAL_TABLET | Freq: Every day | ORAL | 3 refills | Status: DC

## 2022-08-13 NOTE — Progress Notes (Signed)
Subjective:    Patient ID: Ashley Lindsey, female    DOB: 03/08/1961, 62 y.o.   MRN: [id number]  HPI Ashley Lindsey is a 62 year old woman who presents for follow-up of lymphedema, lower extremity pain, and fibromyalgia  1) LUE lymphedema -garment was too expensive -when it flares it gives her no warning -she received a booklet and she does the therapy and sometimes it works and sometimes it doesn't -worse in the biceps -aches and wakes her up  2) Ashley Lindsey -working out in [employer] and [employer] -up and down with emotions -had to put her uncle in a nursing home and this circles her -she is cleaning out her uncle's room -she has had more flares recently.   3) Lower extremity pain -she sometimes feels that it radiates from her back -has not had any recent spinal imaging -she would be interested in increasing her dose of topamax  4) Obesity -she asks for alternatives to wegivy  Pain Inventory Average Pain 8 Pain Right Now 8 My pain is constant, dull, and aching  In the last 24 hours, has pain interfered with the following? General activity 9 Relation with others 9 Enjoyment of life 9 What TIME of day is your pain at its worst? varies Sleep (in general) Fair  Pain is worse with: walking, sitting, inactivity, standing, and some activites Pain improves with: rest, heat/ice, therapy/exercise, pacing activities, medication, and injections Relief from Meds: 9  Family History  Problem Relation Age of Onset   Breast cancer Mother 32   Heart disease Father    Diabetes Sister    Prostate cancer Maternal Grandfather 51   Cancer Maternal Aunt 63       d. from "female cancer" possibly cervical   Breast cancer Maternal Aunt 60   Prostate cancer Maternal Uncle 78   Prostate cancer Maternal Uncle 80   Prostate cancer Paternal Uncle    Prostate cancer Paternal Uncle    Breast cancer Cousin 30       mat 1st cousin   Social History   Socioeconomic History   Marital  status: Divorced    Spouse name: Not on file   Number of children: Not on file   Years of education: Not on file   Highest education level: Not on file  Occupational History   Occupation: disability  Tobacco Use   Smoking status: Former    Packs/day: 0.25    Years: 1.00    Additional pack years: 0.00    Total pack years: 0.25    Types: Cigarettes   Smokeless tobacco: Never  Vaping Use   Vaping Use: Never used  Substance and Sexual Activity   Alcohol use: Not Currently   Drug use: No   Sexual activity: Not Currently    Birth control/protection: Surgical  Other Topics Concern   Not on file  Social History Narrative   Right handed    Coffee daily, sometimes Ginger ale    Social Determinants of Health   Financial Resource Strain: Low Risk  (02/08/2022)   Overall Financial Resource Strain (CARDIA)    Difficulty of Paying Living Expenses: Not hard at all  Food Insecurity: No Food Insecurity (02/08/2022)   Hunger Vital Sign    Worried About Running Out of Food in the Last Year: Never true    Ran Out of Food in the Last Year: Never true  Transportation Needs: No Transportation Needs (02/08/2022)   PRAPARE - Transportation    Lack of  Transportation (Medical): No    Lack of Transportation (Non-Medical): No  Physical Activity: Inactive (02/08/2022)   Exercise Vital Sign    Days of Exercise per Week: 0 days    Minutes of Exercise per Session: 0 min  Stress: No Stress Concern Present (02/08/2022)   [facility] [employer] - Occupational Stress Questionnaire    Feeling of Stress : Not at all  Social Connections: Not on file   Past Surgical History:  Procedure Laterality Date   ABDOMINAL HYSTERECTOMY     with left salpingooophorectomy   BIOPSY  04/30/2022   Procedure: BIOPSY;  Surgeon: Sherrilyn Rist, MD;  Location: [facility];  Service: Gastroenterology;;   BREAST LUMPECTOMY  2009/2012   LEFT/RIGHT with lymph node dissection   COLONOSCOPY WITH PROPOFOL  N/A 04/30/2022   Procedure: COLONOSCOPY WITH PROPOFOL;  Surgeon: Sherrilyn Rist, MD;  Location: [facility];  Service: Gastroenterology;  Laterality: N/A;   ESOPHAGOGASTRODUODENOSCOPY (EGD) WITH PROPOFOL N/A 04/30/2022   Procedure: ESOPHAGOGASTRODUODENOSCOPY (EGD) WITH PROPOFOL;  Surgeon: Sherrilyn Rist, MD;  Location: [facility];  Service: Gastroenterology;  Laterality: N/A;   HOT HEMOSTASIS N/A 04/30/2022   Procedure: HOT HEMOSTASIS (ARGON PLASMA COAGULATION/BICAP);  Surgeon: Sherrilyn Rist, MD;  Location: [facility];  Service: Gastroenterology;  Laterality: N/A;   KNEE ARTHROSCOPY     right   OOPHORECTOMY  2013   Past Surgical History:  Procedure Laterality Date   ABDOMINAL HYSTERECTOMY     with left salpingooophorectomy   BIOPSY  04/30/2022   Procedure: BIOPSY;  Surgeon: Sherrilyn Rist, MD;  Location: [facility];  Service: Gastroenterology;;   BREAST LUMPECTOMY  2009/2012   LEFT/RIGHT with lymph node dissection   COLONOSCOPY WITH PROPOFOL N/A 04/30/2022   Procedure: COLONOSCOPY WITH PROPOFOL;  Surgeon: Sherrilyn Rist, MD;  Location: [facility];  Service: Gastroenterology;  Laterality: N/A;   ESOPHAGOGASTRODUODENOSCOPY (EGD) WITH PROPOFOL N/A 04/30/2022   Procedure: ESOPHAGOGASTRODUODENOSCOPY (EGD) WITH PROPOFOL;  Surgeon: Sherrilyn Rist, MD;  Location: [facility];  Service: Gastroenterology;  Laterality: N/A;   HOT HEMOSTASIS N/A 04/30/2022   Procedure: HOT HEMOSTASIS (ARGON PLASMA COAGULATION/BICAP);  Surgeon: Sherrilyn Rist, MD;  Location: [facility];  Service: Gastroenterology;  Laterality: N/A;   KNEE ARTHROSCOPY     right   OOPHORECTOMY  2013   Past Medical History:  Diagnosis Date   Abdominal cramps    Anxiety    Arthritis    breast cancer    s/p radiation- last dose 6/12//has been off Tamoxifen 8/12   Chronic headaches    Costochondritis    Fatigue    Fibromyalgia 03/06/2013   GERD (gastroesophageal reflux disease)    History of COVID-19     Hypertension    Lymphedema    RIGHT ARM-////  STATES USE LEFT ARM FOR BP'S   Migraines    Muscle spasms of head and/or neck    following to the breast   Passed out    4th of july   Personal history of radiation therapy 2010   lt breast    S/P radiation therapy 08/19/07 - 10/10/07   Left Breast/5040 cGy/28 fractions with Boost for a toatl dose of 6300 dGy   S/P radiation therapy 08/14/10 -10/03/10   right breast   Sleep apnea    STOP BANG SCORE 5   There were no vitals taken for this visit.  Opioid Risk Score:   Fall Risk Score:  `1  Depression screen  PHQ 2/9     02/08/2022   10:11 AM 01/16/2022    9:14 AM 12/19/2021   10:39 AM 07/06/2021   10:17 AM 02/02/2021    9:45 AM 01/11/2021    2:05 PM 12/02/2019    2:55 PM  Depression screen PHQ 2/9  Decreased Interest 0 0 0 1 1 0 1  Down, Depressed, Hopeless 0 0 0 1 1 0 1  PHQ - 2 Score 0 0 0 2 2 0 2  Altered sleeping       1  Tired, decreased energy       3  Change in appetite       1  Feeling bad or failure about yourself        0  Trouble concentrating       3  Moving slowly or fidgety/restless       0  Suicidal thoughts       0  PHQ-9 Score       10  Difficult doing work/chores       Somewhat difficult     Review of Systems  Musculoskeletal:  Positive for neck pain.       B/L hips knee finger tip pain RT shoulder/arm  All other systems reviewed and are negative.      Objective:   Physical Exam Not performed      Assessment & Plan:   1) LUE lymphedema -will give script for garment -commended her on doing her lymphedema exercises -continue tiger balm  2) Fibromyalgia -discussed her family stressors -continue tylenol -continue gabapentin, discussed can double of dose for severe flares.  -Discussed current symptoms of pain and history of pain.  -Discussed benefits of exercise in reducing pain. -Discussed following foods that may reduce pain: 1) Ginger (especially studied for arthritis)- reduce leukotriene  production to decrease inflammation 2) Blueberries- high in phytonutrients that decrease inflammation 3) Salmon- marine omega-3s reduce joint swelling and pain 4) Pumpkin seeds- reduce inflammation 5) dark chocolate- reduces inflammation 6) turmeric- reduces inflammation 7) tart cherries - reduce pain and stiffness 8) extra virgin olive oil - its compound olecanthal helps to block prostaglandins  9) chili peppers- can be eaten or applied topically via capsaicin 10) mint- helpful for headache, muscle aches, joint pain, and itching 11) garlic- reduces inflammation  Link to further information on diet for chronic pain: [email or web address]   3) Right 5th digit DIP OA: -XR ordered  4) Bilateral knee pain -RF and ANA ordered.   5) Obesity: -Educated that current weight is 274 lbs and current BMI is 50.12 Discussed following criteria for Wegovy: 1) Patient has diagnosis of obesity; 2) Patient must be 25 years of age or older; 3) The patient has been involved in a physician or dietitian monitored weight loss program, consisting of both low-calorie diet, increased physical activity and behavioral counseling for a minimum of 6 months without a 3% loss from baseline; 4) Patients BMI is one of the following: a) 30kg/m or greater; b) 27 29.99 kg/m in the presence of at least one weight related comorbidity such as coronary heart disease, dyslipidemia, hypertension, symptomatic arthritis of the lower extremities, type 2 diabetes mellitus, obstructive sleep apnea; c) 25-29.9 kg/m2 and waist circumference is &gt; 40 inches for males or &gt; 35 inches for females;5) Not Met - Must have tried and failed at least one preferred oral agent such as Phentermine; 6) Patient has no labeled contraindication; 7) Patient is not taking another weight loss medication; 8) The dose  is within approved product labeling guidelines; 10) The patient must not be  taking another GLP-1 receptor agonist AND the patient must not be taking insulin concurrently.  -prescribed [id number] but discussed that this was denied -increase topamax HS to  -referred to nutrition -recommended resistance training and prioritizing protein intake to avoid muscle loss while taking this medication -discussed risks of thyroid cancer and gastroparesis  -Educated regarding health benefits of weight loss- for pain, general health, chronic disease prevention, immune health, mental health.  -Will monitor weight every visit.  -Consider Roobois tea daily.  -Discussed the benefits of intermittent fasting. -Discussed foods that can assist in weight loss: 1) leafy greens- high in fiber and nutrients 2) dark chocolate- improves metabolism (if prefer sweetened, best to sweeten with honey instead of sugar).  3) cruciferous vegetables- high in fiber and protein 4) full fat yogurt: high in healthy fat, protein, calcium, and probiotics 5) apples- high in a variety of phytochemicals 6) nuts- high in fiber and protein that increase feelings of fullness 7) grapefruit: rich in nutrients, antioxidants, and fiber (not to be taken with anticoagulation) 8) beans- high in protein and fiber 9) salmon- has high quality protein and healthy fats 10) green tea- rich in polyphenols 11) eggs- rich in choline and vitamin D 12) tuna- high protein, boosts metabolism 13) avocado- decreases visceral abdominal fat 14) chicken (pasture raised): high in protein and iron 15) blueberries- reduce abdominal fat and cholesterol 16) whole grains- decreases calories retained during digestion, speeds metabolism 17) chia seeds- curb appetite 18) chilies- increases fat metabolism  -Discussed supplements that can be used:  1) Metatrim  BID 30 minutes before breakfast and dinner  2) Sphaeranthus indicus and Garcinia mangostana (combinations of these and #1 can be found in capsicum and zychrome  3) green coffee  bean extract  twice per day or Irvingia (african mango) 150 to  twice per day.  6) Iron deficiency anemia: -discussed that when she supplemented she felt improved energy.   7) Radicular lumbar pain: -lumbar XR ordered -increase topamax HS to   12 minutes spent in discussion of her radicular lumbar pain, obesity, increasing topamax HS  to , referral to medical nutrition therapy

## 2022-08-16 ENCOUNTER — Institutional Professional Consult (permissible substitution): Payer: 59 | Admitting: Neurology

## 2022-08-28 ENCOUNTER — Ambulatory Visit: Payer: 59

## 2022-08-28 DIAGNOSIS — I429 Cardiomyopathy, unspecified: Secondary | ICD-10-CM

## 2022-08-28 DIAGNOSIS — I10 Essential (primary) hypertension: Secondary | ICD-10-CM | POA: Diagnosis not present

## 2022-08-29 ENCOUNTER — Other Ambulatory Visit: Payer: Self-pay

## 2022-08-29 DIAGNOSIS — I429 Cardiomyopathy, unspecified: Secondary | ICD-10-CM

## 2022-08-29 NOTE — Progress Notes (Signed)
Call to remind patient of 09/04/22 appt. Reminded her to bring medication bottles with her. Labs ordered and released.

## 2022-09-04 ENCOUNTER — Encounter: Payer: Self-pay | Admitting: Neurology

## 2022-09-04 ENCOUNTER — Telehealth: Payer: Self-pay

## 2022-09-04 ENCOUNTER — Ambulatory Visit: Payer: 59 | Admitting: Cardiology

## 2022-09-04 ENCOUNTER — Encounter: Payer: Self-pay | Admitting: Physician Assistant

## 2022-09-04 ENCOUNTER — Ambulatory Visit (INDEPENDENT_AMBULATORY_CARE_PROVIDER_SITE_OTHER): Payer: 59 | Admitting: Physician Assistant

## 2022-09-04 ENCOUNTER — Ambulatory Visit (INDEPENDENT_AMBULATORY_CARE_PROVIDER_SITE_OTHER): Payer: 59 | Admitting: Neurology

## 2022-09-04 VITALS — BP 121/75 | HR 94 | Ht 62.0 in | Wt 288.4 lb

## 2022-09-04 DIAGNOSIS — G4733 Obstructive sleep apnea (adult) (pediatric): Secondary | ICD-10-CM | POA: Diagnosis not present

## 2022-09-04 DIAGNOSIS — G4734 Idiopathic sleep related nonobstructive alveolar hypoventilation: Secondary | ICD-10-CM | POA: Diagnosis not present

## 2022-09-04 DIAGNOSIS — G43709 Chronic migraine without aura, not intractable, without status migrainosus: Secondary | ICD-10-CM | POA: Diagnosis not present

## 2022-09-04 DIAGNOSIS — R0601 Orthopnea: Secondary | ICD-10-CM | POA: Diagnosis not present

## 2022-09-04 DIAGNOSIS — M1712 Unilateral primary osteoarthritis, left knee: Secondary | ICD-10-CM | POA: Diagnosis not present

## 2022-09-04 DIAGNOSIS — F40240 Claustrophobia: Secondary | ICD-10-CM | POA: Diagnosis not present

## 2022-09-04 DIAGNOSIS — R519 Headache, unspecified: Secondary | ICD-10-CM

## 2022-09-04 NOTE — Patient Instructions (Signed)
Safe Surgery and Sleep Apnea Sleep apnea is a condition in which breathing pauses or becomes shallow during sleep. Most people with the condition are not aware that they have it. Your health care providers need to know whether or not you have sleep apnea, especially if you are having surgery. Sleep apnea can increase your risk of complications during and after surgery. Tell a health care provider about: Any medical conditions you have, especially if you have sleep apnea. Any allergies you have. All medicines you are taking, including vitamins, herbs, eye drops, creams, and over-the-counter medicines. Any blood disorders you have. Any problems you or family members have had with anesthetic medicines. Any surgeries you have had. Whether you are pregnant or may be pregnant. What are the risks of having surgery? Untreated sleep apnea increases the risk for certain complications during and after surgery. This is because when you have sleep apnea, your airways are more sensitive to medicines used during surgery. The airways can collapse and block the flow of air.  Having untreated sleep apnea can increase your risk for: A longer stay in the recovery room or hospital. Breathing difficulties such as low oxygen levels after surgery. Increased pain after surgery. Irregular heart rhythms. Stroke. Heart attack. You and your health care provider can take steps to help prevent these and other complications. What happens before the surgery? Sleep apnea screening Sleep apnea screening is a series of questions that determine if you are at risk for sleep apnea. Before you have surgery, get screened for sleep apnea and talk with your surgeon and primary health care provider about your results. Screening usually involves answering questions about your sleep quality. Ask your health care provider if you can be screened, or take a screening test yourself. You can find these tests online at the American Sleep Apnea  Association website. Some questions you may be asked include: Do you snore? Is your sleep restless? Do you have daytime sleepiness? Has a partner or spouse told you that you stop breathing during sleep? Have you had trouble concentrating or memory loss? Answer these questions honestly. If a screening test is positive, this means you are at risk for the condition. Further testing may be needed to confirm a diagnosis of sleep apnea. General instructions Talk to your health care provider about your individual risks based on your screening results and the type of surgery you will be having. If you have a sleep apnea device (positive airway pressure device), wear it as prescribed. If you have not been wearing your device, talk with your health care provider about why you have not been wearing it. There are ways to improve your use of the device, such as: Adjusting the mask. Adding humidified air. Getting treatment for nasal congestion. Do not use any products that contain nicotine or tobacco. These products include cigarettes, chewing tobacco, and vaping devices, such as e-cigarettes. If you need help quitting, ask your health care provider. Do not drink alcohol the day before or after your surgery because it can worsen your sleep apnea. What happens on the day of surgery? If told by your health care provider, bring your sleep apnea device with you. Wear your sleep apnea device when you are sleeping during your hospital stay, or as told by your health care provider. Ask your health care provider what special considerations will be taken during and after your surgery. What can I expect after the surgery? You may need to be given extra oxygen and wear a continuous   oxygen monitor (pulse oximetry). For your safety, you may need to stay in the recovery room or hospital for longer than usual. Follow these instructions at home: Medicines Avoid using sleep medicines unless they are prescribed by a health  care provider who is aware of the results of your sleep apnea screening. Avoid using sleep medicines while taking opioid pain medicine. Limit your use of opioid pain medicines as much as possible. Ask your health care provider what is a safe amount to use. Ask about using pain medicines that do not affect your breathing, such as NSAIDs or acetaminophen. General instructions  If your health care provider approves, raise the head of your bed or lie on your side. Do not lie flat on your back. Follow instructions from your health care provider about wearing your sleep device: Anytime you are sleeping, including during daytime naps. While taking prescription pain medicines, sleeping medicines, or medicines that make you drowsy. If you will be going home right after the procedure, plan to have a responsible adult care for you for the time you are told. This is important. Where to find more information For more information about sleep apnea screening and healthy sleep, visit these websites: Centers for Disease Control and Prevention: www.cdc.gov American Sleep Apnea Association: www.sleepapnea.org Contact a health care provider if: You have sleep apnea or think you may be at risk for sleep apnea, and you are scheduled for surgery. Get help right away if: You have trouble breathing. You are very drowsy and cannot stay awake. You are told that you have pauses in your breathing during sleep after surgery. You have chest pain. You have a fast heartbeat. These symptoms may represent a serious problem that is an emergency. Do not wait to see if the symptoms will go away. Get medical help right away. Call your local emergency services (911 in the [place] Do not drive yourself to the hospital. Summary Your health care providers need to know whether or not you have sleep apnea, especially if you are having surgery. If you have sleep apnea, you are at an increased risk for complications during surgery. You  and your health care provider can take precautions to help prevent complications. If you have sleep apnea, make sure to tell your health care provider and anesthesia specialist. This information is not intended to replace advice given to you by your health care provider. Make sure you discuss any questions you have with your health care provider. Document Revised: 03/18/2020 Document Reviewed: 03/18/2020 Elsevier Patient Education  2023 Elsevier Inc. Quality Sleep Information, Adult Quality sleep is important for your mental and physical health. It also improves your quality of life. Quality sleep means you: Are asleep for most of the time you are in bed. Fall asleep within 30 minutes. Wake up no more than once a night. Are awake for no longer than 20 minutes if you do wake up during the night. Most adults need 7-8 hours of quality sleep each night. How can poor sleep affect me? If you do not get enough quality sleep, you may have: Mood swings. Daytime sleepiness. Decreased alertness, reaction time, and concentration. Sleep disorders, such as insomnia and sleep apnea. Difficulty with: Solving problems. Coping with stress. Paying attention. These issues may affect your performance and productivity at work, school, and home. Lack of sleep may also put you at higher risk for accidents, suicide, and risky behaviors. If you do not get quality sleep, you may also be at higher risk for several   health problems, including: Infections. Type 2 diabetes. Heart disease. High blood pressure. Obesity. Worsening of long-term conditions, like arthritis, kidney disease, depression, Parkinson's disease, and epilepsy. What actions can I take to get more quality sleep? Sleep schedule and routine Stick to a sleep schedule. Go to sleep and wake up at about the same time each day. Do not try to sleep less on weekdays and make up for lost sleep on weekends. This does not work. Limit naps during the day to 30  minutes or less. Do not take naps in the late afternoon. Make time to relax before bed. Reading, listening to music, or taking a hot bath promotes quality sleep. Make your bedroom a place that promotes quality sleep. Keep your bedroom dark, quiet, and at a comfortable room temperature. Make sure your bed is comfortable. Avoid using electronic devices that give off bright blue light for 30 minutes before bedtime. Your brain perceives bright blue light as sunlight. This includes television, phones, and computers. If you are lying awake in bed for longer than 20 minutes, get up and do a relaxing activity until you feel sleepy. Lifestyle     Try to get at least 30 minutes of exercise on most days. Do not exercise 2-3 hours before going to bed. Do not use any products that contain nicotine or tobacco. These products include cigarettes, chewing tobacco, and vaping devices, such as e-cigarettes. If you need help quitting, ask your health care provider. Do not drink caffeinated beverages for at least 8 hours before going to bed. Coffee, tea, and some sodas contain caffeine. Do not drink alcohol or eat large meals close to bedtime. Try to get at least 30 minutes of sunlight every day. Morning sunlight is best. Medical concerns Work with your health care provider to treat medical conditions that may affect sleeping, such as: Nasal obstruction. Snoring. Sleep apnea and other sleep disorders. Talk to your health care provider if you think any of your prescription medicines may cause you to have difficulty falling or staying asleep. If you have sleep problems, talk with a sleep consultant. If you think you have a sleep disorder, talk with your health care provider about getting evaluated by a specialist. Where to find more information Sleep Foundation: sleepfoundation.org American Academy of Sleep Medicine: aasm.org Centers for Disease Control and Prevention (CDC): cdc.gov Contact a health care provider  if: You have trouble getting to sleep or staying asleep. You often wake up very early in the morning and cannot get back to sleep. You have daytime sleepiness. You have daytime sleep attacks of suddenly falling asleep and sudden muscle weakness (narcolepsy). You have a tingling sensation in your legs with a strong urge to move your legs (restless legs syndrome). You stop breathing briefly during sleep (sleep apnea). You think you have a sleep disorder or are taking a medicine that is affecting your quality of sleep. Summary Most adults need 7-8 hours of quality sleep each night. Getting enough quality sleep is important for your mental and physical health. Make your bedroom a place that promotes quality sleep, and avoid things that may cause you to have poor sleep, such as alcohol, caffeine, smoking, or large meals. Talk to your health care provider if you have trouble falling asleep or staying asleep. This information is not intended to replace advice given to you by your health care provider. Make sure you discuss any questions you have with your health care provider. Document Revised: 08/02/2021 Document Reviewed: 08/02/2021 Elsevier Patient Education    2023 Elsevier Inc.  

## 2022-09-04 NOTE — Progress Notes (Signed)
Provider:  Melvyn Novas, MD  Primary Care Physician:  [name], FNP [address] [place] [address]     Referring Provider: originally  Dr Lucia Gaskins, now PCP.        Chief Complaint according to patient   Patient presents with:     New Patient (Initial Visit)           HISTORY OF PRESENT ILLNESS:  Ashley Lindsey is a 62 y.o. female patient who is here for revisit 09/04/2022 for  sleep apnea, wants a new evaluation: The patient was referred by Dr Lucia Gaskins, MD .   09-04-2022: Chief concern according to patient :  " my headaches have worsened, and I need to consider CPAP now -BTW: " I never got my BiPAP "- see note from NP [place] below. The patient didn't pick up her prescribed machine and never corresponded with DME about her mask preferences, etc.   She has taken Excedrin in excess over years, and was taken off by GI after a Colonoscopy confirmed in march 2024 as she has ulcers, lost blood, became anemic.   She is awaiting TK replacement on the left nee, but is too heavy to get surgery.  She has meanwhile gained weight, and while she was recently on vacation  her daughter had mentioned how very loud she snores.  In order to work her up again I will have to order a home sleep test this time this will be a WatchPAT device.  My goal is to place her on an auto titration CPAP machine and I do not care how well the mask will prevent air leaks I want her to start in the name of comfort with some interface  of her comfort. .  She has taken Excedrin in excess over years, and was taken off by GI after a Colonoscopy confirmed in march 2024 as she has ulcers, lost blood, became anemic.   Fatigue severity scale was today endorsed at 46.63.  Epworth Sleepiness Scale was endorsed at 15 points, the geriatric depression score was endorsed at 5 out of 15 points.     Split study was not performed, she had only mild apnea, but hypoxia, returned for PAP titration.  Orthopnea.  The patient had undergone an in lab sleep study with CPAP titration on 08-12-2019 so more than 3 years ago.  She was brought into the room with an adjustable bed because she could not sleep flat.  She was again given a fullface mask which she tolerated initially well but she had 2 trips to the bathroom and had to take Tylenol at night for leg and hip pain.  She also used her albuterol inhaler twice for audible wheezing.  An expiratory pressure relief of 3 cm was implied.  CPAP pressures of 10 and 11 helped to maintain her oxygen saturation above 89% and so she did not have to be placed on oxygen and her heart rate showed normal sinus rhythm.  Her attending was [employer], RPSGT.     Butch Penny, NP 09/22/20: Ms. Demello is a 62 year old female with a history of obstructive sleep apnea and migraine headaches.  She returns today for follow-up.  She has not started on CPAP therapy reports that she has been nervous about wearing a mask at night.  She states this past Sunday she woke up with a severe headache.  She ended up going to church but passed out and was taken  to the emergency room.  She is not reporting a right-sided symptoms that she thought was consistent with a stroke.  She was diagnosed with complicated migraine.  CT scan of the head  was normal.  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  The patient currently just uses Excedrin Migraine if she gets a headache.  Patient reports that she wakes up almost daily with a headache.  Some mornings are more severe than others.   HISTORY (Copied from Dr.Tayna Smethurst's note) Ashley Lindsey is a 62 y.o. year old African American female patient seen here as a referral on 05/25/2019 referred by Dr. Naomie Dean, MD.Chief concern according to patient :   Mrs. Kerzner has suffered from sleep related headaches, often woke up with headaches, the headaches have been  chronic intractable.  She reports to them as migraines also Dr. Lucia Gaskins seem to qualify them as unspecified headache type.  There is a possibility that they are related to medication overuse.  There is also possibility that they are related to at this time undetected could-untreated sleep apnea.  Previous sleep test 2013 did not reveal sleep apnea, was performed at [facility] sleep center.  The patient was diagnosed with breast cancer in the year 2013 and again 2015 first left and right breast affected, this interval history is very important.   I have the pleasure of seeing Ashley Lindsey today, a right-handed [place] American female with a possible sleep disorder.  She has a  has a past medical history of Abdominal cramps, Arthritis, Breast Cancer (HCC), Chronic headaches, Costochondritis, Fatigue,Lymphedema, Migraines, Muscle spasms of head and/or neck, Passed out, Personal history of radiation therapy (2010), S/P radiation therapy (08/19/07 - 10/10/07), S/P radiation therapy (08/14/10 -10/03/10), and rebound migraine headaches.    The patient's previous sleep study is available here in copy.  She underwent a baseline polysomnography on 22 November 2011 and she was diagnosed with obstructive sleep apnea.  So on the contrary she actually had an AHI of 16.1 and RDI of 18.7/h significant for mild sleep apnea there were a lot of spontaneous arousals at night.  Two thirds of her events were hypopneas and only one third in apneas all of them were obstructive in nature the longest hypopnea duration was 53.8 seconds.  The apnea was strictly bound to REM sleep.  REM dependent apnea would usually require CPAP titration for which she returned to the [facility] left on 10 June 2012 still has a significant number of spontaneous arousals 47 for the night but her AHI has been reduced to 0.0 she was tachypneic her overnight respiratory rate was 79 minutes she was titrated to only 5 cm water pressure under  which her sleep efficiency was very much reduced and then to 6 cm water pressure where she also reached a low sleep efficiency but this time 77%.  Total sleep time was 230 minutes of which over 70 minutes were in rem sleep.  She did not have oxygen desaturation and she did not have apneas or hypopneas.  Dr. Jetty Duhamel was the treating physician.  So she was ordered to use a small ResMed medium Mirage fullface mask and optimal CPAP pressure was determined to be 6 cm water.  Room air. She was "never given a CPAP ".    In the meantime the patient continues to wake up fatigued she  has loud and continuous snoring, she has a lot of body aching and pain sometimes shortness of breath trouble concentrating hypersomnia and daytime indigestion and acid reflux, continued weight gain, continued headaches especially in the morning she wakes up with headaches she also reports night sleep sweats sleeps better when she is slightly propped up, she takes almost daily naps and she has a dry mouth.  In addition chest pain when exercising, leg pain when exercising and she has swollen ankles and lower extremities.       Review of Systems:Review of Systems: Out of a complete 14 system review, the patient complains of only the following symptoms, and all other reviewed systems are negative.  Epworth Sleepiness score: see above.  Fatigue, sleepiness , snoring, fragmented sleep, NOCTURIA 4-6 times !!! Orthopnea and GERD related sleep interruptions. Dry mouth, dry eyes.   Super obesity.   Headaches wake her form sleep, full achy , pounding, top of the head,  And waking up with pain in temples and forehead.   My goal is to place her on an auto titration CPAP machine and I do not care how well the mask will prevent air leaks I want her to start in the name of comfort with some interface that she cannot sleep worse.  Fatigue severity scale was today endorsed at 46.63.  Epworth Sleepiness Scale was endorsed at 15 points, the  geriatric depression score was endorsed at 5 out of 15 points.  Social History   Socioeconomic History   Marital status: Divorced    Spouse name: Not on file   Number of children: Not on file   Years of education: Not on file   Highest education level: Not on file  Occupational History   Occupation: disability  Tobacco Use   Smoking status: Former    Packs/day: 0.25    Years: 1.00    Additional pack years: 0.00    Total pack years: 0.25    Types: Cigarettes   Smokeless tobacco: Never  Vaping Use   Vaping Use: Never used  Substance and Sexual Activity   Alcohol use: Not Currently   Drug use: No   Sexual activity: Not Currently    Birth control/protection: Surgical  Other Topics Concern   Not on file  Social History Narrative   Right handed    Coffee daily, sometimes Ginger ale    Social Determinants of Health   Financial Resource Strain: Low Risk  (02/08/2022)   Overall Financial Resource Strain (CARDIA)    Difficulty of Paying Living Expenses: Not hard at all  Food Insecurity: No Food Insecurity (02/08/2022)   Hunger Vital Sign    Worried About Running Out of Food in the Last Year: Never true    Ran Out of Food in the Last Year: Never true  Transportation Needs: No Transportation Needs (02/08/2022)   PRAPARE - Administrator, Civil Service (Medical): No    Lack of Transportation (Non-Medical): No  Physical Activity: Inactive (02/08/2022)   Exercise Vital Sign    Days of Exercise per Week: 0 days    Minutes of Exercise per Session: 0 min  Stress: No Stress Concern Present (02/08/2022)   [name] of Occupational Health - Occupational Stress Questionnaire    Feeling of Stress : Not at all  Social Connections: Not on file    Family History  Problem Relation Age of Onset   Breast cancer Mother 52   Heart disease Father    Diabetes Sister  Prostate cancer Maternal Grandfather 75   Cancer Maternal Aunt 63       d. from "female cancer"  possibly cervical   Breast cancer Maternal Aunt 60   Prostate cancer Maternal Uncle 78   Prostate cancer Maternal Uncle 80   Prostate cancer Paternal Uncle    Prostate cancer Paternal Uncle    Breast cancer Cousin 30       mat 1st cousin    Past Medical History:  Diagnosis Date   Abdominal cramps    Anxiety    Arthritis    breast cancer    s/p radiation- last dose 6/12//has been off Tamoxifen 8/12   Chronic headaches    Costochondritis    Fatigue    Fibromyalgia 03/06/2013   GERD (gastroesophageal reflux disease)    History of COVID-19    Hypertension    Lymphedema    RIGHT ARM-////  STATES USE LEFT ARM FOR BP'S   Migraines    Muscle spasms of head and/or neck    following to the breast   Passed out    4th of july   Personal history of radiation therapy 2010   lt breast    S/P radiation therapy 08/19/07 - 10/10/07   Left Breast/5040 cGy/28 fractions with Boost for a toatl dose of 6300 dGy   S/P radiation therapy 08/14/10 -10/03/10   right breast   Sleep apnea    STOP BANG SCORE 5    Past Surgical History:  Procedure Laterality Date   ABDOMINAL HYSTERECTOMY     with left salpingooophorectomy   BIOPSY  04/30/2022   Procedure: BIOPSY;  Surgeon: Sherrilyn Rist, MD;  Location: [facility];  Service: Gastroenterology;;   BREAST LUMPECTOMY  2009/2012   LEFT/RIGHT with lymph node dissection   COLONOSCOPY WITH PROPOFOL N/A 04/30/2022   Procedure: COLONOSCOPY WITH PROPOFOL;  Surgeon: Sherrilyn Rist, MD;  Location: [facility];  Service: Gastroenterology;  Laterality: N/A;   ESOPHAGOGASTRODUODENOSCOPY (EGD) WITH PROPOFOL N/A 04/30/2022   Procedure: ESOPHAGOGASTRODUODENOSCOPY (EGD) WITH PROPOFOL;  Surgeon: Sherrilyn Rist, MD;  Location: [facility];  Service: Gastroenterology;  Laterality: N/A;   HOT HEMOSTASIS N/A 04/30/2022   Procedure: HOT HEMOSTASIS (ARGON PLASMA COAGULATION/BICAP);  Surgeon: Sherrilyn Rist, MD;  Location: [facility];  Service: Gastroenterology;   Laterality: N/A;   KNEE ARTHROSCOPY     right   OOPHORECTOMY  2013     Current Outpatient Medications on File Prior to Visit  Medication Sig Dispense Refill   acetaminophen (TYLENOL) 650 MG CR tablet Take 650 mg by mouth every 8 (eight) hours as needed for pain.     albuterol (VENTOLIN HFA) 108 (90 Base) MCG/ACT inhaler INHALE 1-2 PUFFS INTO THE LUNGS EVERY 6 (SIX) HOURS AS NEEDED FOR WHEEZING OR SHORTNESS OF BREATH. 6.7 each 1   atorvastatin (LIPITOR) 10 MG tablet TAKE 1 TABLET BY MOUTH EVERY DAY AT NIGHT 90 tablet 1   benzonatate (TESSALON PERLES) 100 MG capsule Take 1 capsule (100 mg total) by mouth every 6 (six) hours as needed. 30 capsule 1   Bisacodyl (DULCOLAX PO) Take 1 capsule by mouth daily as needed (constipation).      cholecalciferol 5000 units TABS Take 1 tablet (5,000 Units total) by mouth 2 (two) times daily. (Patient taking differently: Take 5,000 Units by mouth daily.)     cyclobenzaprine (FLEXERIL) 10 MG tablet Take 1 tablet (10 mg total) by mouth 3 (three) times daily. 90 tablet 1   dexlansoprazole (DEXILANT)  60 MG capsule TAKE 1 CAPSULE BY MOUTH EVERY DAY 90 capsule 1   diclofenac Sodium (VOLTAREN) 1 % GEL Apply 2 g topically 4 (four) times daily. (Patient taking differently: Apply 2 g topically in the morning and at bedtime.) 2 g 3   diphenhydrAMINE (BENADRYL) 25 MG tablet Take 1 tablet (25 mg total) by mouth every 6 (six) hours as needed for up to 20 doses (migraine). Take with reglan for migraine headache 20 tablet 0   ELDERBERRY PO Take 1 tablet by mouth daily.     ENTRESTO 24-26 MG TAKE 1 TABLET BY MOUTH TWICE A DAY 180 tablet 0   ferrous sulfate 324 (65 Fe) MG TBEC Take 325 mg by mouth 2 (two) times daily.     fish oil-omega-3 fatty acids 1000 MG capsule Take 1 g by mouth daily.     Flaxseed, Linseed, (FLAXSEED OIL MAX STR PO) Take 1 capsule by mouth daily.      gabapentin (NEURONTIN) 300 MG capsule TAKE 1 CAPSULE BY MOUTH THREE TIMES A DAY 90 capsule 3   losartan  (COZAAR) 25 MG tablet Take by mouth daily.     Magnesium 100 MG TABS Take 1 tablet by mouth daily. Takes 50 mg     meloxicam (MOBIC) 7.5 MG tablet TAKE 1 TABLET BY MOUTH EVERY DAY 90 tablet 0   Multiple Vitamins-Minerals (MULTIVITAMIN & MINERAL PO) Take 1 tablet by mouth daily.     mupirocin ointment (BACTROBAN) 2 % Apply 1 Application topically 2 (two) times daily. (Patient taking differently: Apply 1 Application topically as needed.) 22 g 0   ondansetron (ZOFRAN) 4 MG tablet TAKE 1 TABLET BY MOUTH DAILY AS NEEDED FOR NAUSEA OR VOMITING 30 tablet 1   OVER THE COUNTER MEDICATION Take 125 mg by mouth daily. [place]/Simethicone     polyethylene glycol (MIRALAX / GLYCOLAX) 17 g packet Take 17 g by mouth daily as needed.     propranolol (INDERAL) 10 MG tablet TAKE 1 TABLET BY MOUTH TWICE A DAY *HOLD IF SYSTOLIC BP (TOP #) IS <100MMHG OR PULSE IS <60BPM (Patient taking differently: Take 10 mg by mouth at bedtime.) 180 tablet 1   spironolactone (ALDACTONE) 25 MG tablet TAKE 1 TABLET BY MOUTH EVERY DAY IN THE MORNING 90 tablet 0   SYMBICORT 80-4.5 MCG/ACT inhaler INHALE 2 PUFFS INTO THE LUNGS IN THE MORNING AND AT BEDTIME. 30.6 each 1   topiramate (TOPAMAX) 25 MG tablet Take 1 tablet (25 mg total) by mouth 2 (two) times daily. 180 tablet 3   topiramate (TOPAMAX) 50 MG tablet Take 1 tablet (50 mg total) by mouth at bedtime. 90 tablet 3   triamcinolone cream (KENALOG) 0.1 % APPLY 1 APPLICATION TOPICALLY 2 (TWO) TIMES DAILY AS NEEDED (RASH). 60 g 1   Turmeric 500 MG CAPS Take by mouth.     venlafaxine XR (EFFEXOR-XR) 150 MG 24 hr capsule TAKE 1 CAPSULE BY MOUTH NIGHTLY WITH 75MG  CAPSULE FOR A TOTAL OF 225MG  NIGHTLY 90 capsule 3   venlafaxine XR (EFFEXOR-XR) 75 MG 24 hr capsule Total dose 225 mg 90 capsule 3   Vitamin D, Ergocalciferol, (DRISDOL) 1.25 MG (50000 UNIT) CAPS capsule TAKE 1 CAPSULE BY MOUTH EVERY 7 DAYS 20 capsule 0   vitamin E 200 UNIT capsule Take 200 Units by mouth daily.      Semaglutide-Weight Management 0.5 MG/0.5ML SOAJ Inject 0.5 mg into the skin once a week for 28 days. (Patient not taking: Reported on 07/26/2022) 2 mL 0   [  START ON 09/12/2022] Semaglutide-Weight Management 1 MG/0.5ML SOAJ Inject 1 mg into the skin once a week for 28 days. (Patient not taking: Reported on 07/26/2022) 2 mL 0   [START ON 10/11/2022] Semaglutide-Weight Management 1.7 MG/0.75ML SOAJ Inject 1.7 mg into the skin once a week for 28 days. (Patient not taking: Reported on 07/26/2022) 3 mL 0   [START ON 11/09/2022] Semaglutide-Weight Management 2.4 MG/0.75ML SOAJ Inject 2.4 mg into the skin once a week for 28 days. (Patient not taking: Reported on 07/26/2022) 3 mL 0   WEGOVY 0.25 MG/0.5ML SOAJ INJECT 0.25 MG INTO THE SKIN ONCE A WEEK FOR 28 DAYS (Patient not taking: Reported on 07/26/2022) 2 mL 0   No current facility-administered medications on file prior to visit.    Allergies  Allergen Reactions   Aspirin     unknown   Penicillins Hives and Swelling    Has patient had a PCN reaction causing immediate rash, facial/tongue/throat swelling, SOB or lightheadedness with hypotension: Yes Has patient had a PCN reaction causing severe rash involving mucus membranes or skin necrosis: Yes Has patient had a PCN reaction that required hospitalization Yes Has patient had a PCN reaction occurring within the last 10 years: No If all of the above answers are "NO", then may proceed with Cephalosporin use.      DIAGNOSTIC DATA (LABS, IMAGING, TESTING) - I reviewed patient records, labs, notes, testing and imaging myself where available.  Lab Results  Component Value Date   WBC 8.9 07/05/2022   HGB 12.6 07/05/2022   HCT 39.3 07/05/2022   MCV 81.8 07/05/2022   PLT 351.0 07/05/2022      Component Value Date/Time   NA 140 03/05/2022 1110   NA 139 01/07/2017 0920   K 4.6 03/05/2022 1110   K 4.1 01/07/2017 0920   CL 103 03/05/2022 1110   CL 105 12/26/2011 1538   CO2 26 03/05/2022 1110   CO2 25  01/07/2017 0920   GLUCOSE 95 03/05/2022 1110   GLUCOSE 107 (H) 04/06/2021 0248   GLUCOSE 111 01/07/2017 0920   GLUCOSE 103 (H) 12/26/2011 1538   BUN 9 03/05/2022 1110   BUN 8.7 01/07/2017 0920   CREATININE 0.71 03/05/2022 1110   CREATININE 0.72 10/05/2020 1141   CREATININE 0.8 01/07/2017 0920   CALCIUM 9.4 03/05/2022 1110   CALCIUM 8.9 01/07/2017 0920   PROT 7.4 03/29/2022 1134   PROT 7.0 01/07/2017 0920   ALBUMIN 4.3 03/29/2022 1134   ALBUMIN 3.5 01/07/2017 0920   AST 16 03/29/2022 1134   AST 14 (L) 10/05/2020 1141   AST 47 (H) 01/07/2017 0920   ALT 13 03/29/2022 1134   ALT 16 10/05/2020 1141   ALT 54 01/07/2017 0920   ALKPHOS 76 03/29/2022 1134   ALKPHOS 73 01/07/2017 0920   BILITOT <0.2 03/29/2022 1134   BILITOT <0.2 (L) 10/05/2020 1141   BILITOT 0.22 01/07/2017 0920   GFRNONAA >60 04/06/2021 0248   GFRNONAA >60 10/05/2020 1141   GFRAA 110 02/03/2020 1243   GFRAA >60 03/03/2018 1445   Lab Results  Component Value Date   CHOL 203 (H) 06/21/2022   HDL 63 06/21/2022   LDLCALC 107 (H) 06/21/2022   TRIG 193 (H) 06/21/2022   CHOLHDL 3.2 06/21/2022   Lab Results  Component Value Date   HGBA1C 5.8 (H) 03/05/2022   No results found for: "VITAMINB12" No results found for: "TSH"  PHYSICAL EXAM:  Today's Vitals   09/04/22 1102  BP: 121/75  Pulse: 94  Weight:  288 lb 6.4 oz (130.8 kg)  Height: 5\' 2"  (1.575 m)   Body mass index is 52.75 kg/m.   Wt Readings from Last 3 Encounters:  09/04/22 288 lb 6.4 oz (130.8 kg)  07/26/22 279 lb (126.6 kg)  07/16/22 274 lb (124.3 kg)     Ht Readings from Last 3 Encounters:  09/04/22 5\' 2"  (1.575 m)  07/26/22 5\' 2"  (1.575 m)  07/16/22 5\' 2"  (1.575 m)      General: The patient is awake, alert and appears not in acute distress. The patient is well groomed. Head: Normocephalic, atraumatic. Neck is supple. Mallampati 3 plus ,  neck circumference:16.5  inches . Nasal airflow  patent.  Cardiovascular:  Regular rate and cardiac  rhythm by pulse,  without distended neck veins. Respiratory: Lungs are clear to auscultation. No wheezing.  Skin:  With evidence of ankle edema, or rash. Trunk: The patient's posture is erect.   NEUROLOGIC EXAM: The patient is awake and alert, oriented to place and time.   Memory subjective described as intact.  Attention span & concentration ability appears normal.  Speech is fluent,  without  dysphonia or aphasia.  Mood and affect are appropriate.   Cranial nerves: no loss of smell or taste reported  Pupils are equal and briskly reactive to light. Extraocular movements in vertical and horizontal planes were intact and without nystagmus. No Diplopia. Visual fields by finger perimetry are intact. Hearing was intact to soft voice and finger rubbing.    Facial sensation intact to fine touch.  Facial motor strength is symmetric and tongue and uvula move midline.  Neck ROM : rotation, tilt and flexion extension were normal for age and shoulder shrug was symmetrical.    Motor exam:  Symmetric bulk, tone and ROM.   Normal tone without cog wheeling, symmetric grip strength .    ASSESSMENT AND PLAN 62 y.o. year old female  here with:  In order to work her up again I will have to order a home sleep test this time this will be a WatchPAT device.  My goal is to place her on an auto titration CPAP machine and I do not care how well the mask will prevent air leaks I want her to start in the name of comfort with some interface she is comfortable with .    1) untreated sleep hypoxia, sleep apnea, loud snoring, hypoxia can be cause of her morning headaches and cluster headaches, untreated apnea can cause nocturia.  She has orthopnea and this is clearly related to obesity.   Plan : HST will be ordered this time, she will be fitted to a small interface, a nasal cradle or pillow, even if suboptimal.   She is awaiting knee surgery, total knee replacement.  Anesthesiologist would like her to be  treated for OSA before surgery.  She is expected to undergo surgery for the knee once she is treated. She has to loose weight, BMI is 52.   3) OHV and OSA, hypoxia: risk factors are  : super obesity.   Will repeat ONO when the patient is on CPAP.   4) HA : she has not followed with Dr Lucia Gaskins, MD    I plan to follow up either personally or through our NP within 3-4 months.   After spending a total time of  45  minutes face to face and additional time for physical and neurologic examination, review of laboratory studies,  personal review of imaging studies, reports and results of other  testing and review of referral information / records as far as provided in visit,   Electronically signed by: Melvyn Novas, MD 09/04/2022 11:20 AM  [facility] and [facility] Board certified by The [employer] of Sleep Medicine and Diplomate of the [employer] of [employer] Medicine. Board certified In Neurology through the ABPN, Fellow of the [employer] of Neu[employer]. Medical Director of [facility].

## 2022-09-04 NOTE — Progress Notes (Signed)
Office Visit Note   Patient: Ashley Lindsey           Date of Birth: [birth year]           MRN: [id number] Visit Date: 09/04/2022              Requested by: [name], FNP [address] [place],  [place] [address] PCP: [name], FNP  Chief Complaint  Patient presents with   Left Knee - Follow-up      HPI: Patient is a pleasant 62 year old woman who I been following for bilateral arthritis of her knees left greater than right.  She also has a history of fibromyalgia.  Her x-rays show advanced tricompartmental arthritis with bone-on-bone changes and varus collapse especially on her left knee.  She has tried cortisone injections in the past but they have not lasting quite as long as she like.  She is actively working on trying to lose weight so that she can qualify for knee replacement.  Rates her pain as moderate  Assessment & Plan: Visit Diagnoses: Osteoarthritis bilateral knees  Plan: Spoke with the patient today at great length.  She is not eligible for another cortisone injection as she just had 1 less than 6 weeks ago.  I do think she would possibly benefit from viscosupplementation.  She does not want to do chronic pain management as she does not want to have impairment that may come along with that with regards to living her everyday life.  A follow-up when [facility] approved she just wants to try in her left knee for now. This patient is diagnosed with osteoarthritis of the knee(s).    Radiographs show evidence of joint space narrowing, osteophytes, subchondral sclerosis and/or subchondral cysts.  This patient has knee pain which interferes with functional and activities of daily living.    This patient has experienced inadequate response, adverse effects and/or intolerance with conservative treatments such as acetaminophen, NSAIDS, topical creams, physical therapy or regular exercise, knee bracing and/or weight loss.   This patient has experienced inadequate  response or has a contraindication to intra articular steroid injections for at least 3 months.   This patient is not scheduled to have a total knee replacement within 6 months of starting treatment with viscosupplementation.   Follow-Up Instructions: No follow-ups on file.   Ortho Exam  Patient is alert, oriented, no adenopathy, well-dressed, normal affect, normal respiratory effort. Examination of her left knee she has significant varus alignment.  No effusion no redness no erythema she is neurovascular intact compartments are soft and compressible more tenderness over the medial than lateral joint line  Imaging: No results found. No images are attached to the encounter.  Labs: Lab Results  Component Value Date   HGBA1C 5.8 (H) 03/05/2022   HGBA1C 5.9 (H) 10/16/2021   HGBA1C 6.6 (H) 02/08/2021   ESRSEDRATE 48 (H) 08/26/2019   LABURIC 4.8 08/26/2019   REPTSTATUS 06/12/2017 FINAL 06/10/2017   CULT >=100,000 COLONIES/mL ESCHERICHIA COLI (A) 06/10/2017   LABORGA ESCHERICHIA COLI (A) 06/10/2017     Lab Results  Component Value Date   ALBUMIN 4.3 03/29/2022   ALBUMIN 4.2 03/05/2022   ALBUMIN 4.6 02/08/2021    Lab Results  Component Value Date   MG 1.9 01/16/2022   MG 2.0 02/17/2020   Lab Results  Component Value Date   VD25OH 41.6 01/16/2022   VD25OH 32 12/26/2011   VD25OH 55 06/23/2010    No results found for: "PREALBUMIN"  Latest Ref Rng & Units 07/05/2022   12:15 PM 04/12/2022    4:13 PM 03/05/2022   11:10 AM  CBC EXTENDED  WBC 4.0 - 10.5 K/uL 8.9  9.6  10.0   RBC 3.87 - 5.11 Mil/uL 4.81  4.83  4.26   Hemoglobin 12.0 - 15.0 g/dL 16.1  09.6  8.2   HCT 04.5 - 46.0 % 39.3  36.7  29.2   Platelets 150.0 - 400.0 K/uL 351.0  293.0  259   NEUT# 1.4 - 7.7 K/uL 5.4  6.2    Lymph# 0.7 - 4.0 K/uL 2.7  2.6       There is no height or weight on file to calculate BMI.  Orders:  No orders of the defined types were placed in this encounter.  No orders of the  defined types were placed in this encounter.    Procedures: No procedures performed  Clinical Data: No additional findings.  ROS:  All other systems negative, except as noted in the HPI. Review of Systems  Objective: Vital Signs: There were no vitals taken for this visit.  Specialty Comments:  No specialty comments available.  PMFS History: Patient Active Problem List   Diagnosis Date Noted   Suboptimal nutrition (HCC) 07/05/2022   Cardiomyopathy, unspecified type (HCC) 07/05/2022   Essential hypertension 07/05/2022   Type 2 diabetes mellitus with diabetic dermatitis, without long-term current use of insulin (HCC) 07/05/2022   Chronic gastric ulcer without hemorrhage and without perforation 04/30/2022   Gastric AVM 04/30/2022   Iron deficiency anemia 03/06/2022   Unilateral primary osteoarthritis, left knee 10/02/2021   Pulmonary nodules 07/12/2021   Chronic cough 07/12/2021   Avulsion fracture of middle phalanx of finger 04/18/2021   Tibial plateau fracture, left 04/18/2021   Asymmetrical sensorineural hearing loss 02/02/2021   Bilateral primary osteoarthritis of knee 02/09/2020   Chronic intermittent hypoxia with obstructive sleep apnea 08/24/2019   Chronic intractable headache 07/14/2019   Morning headache 07/14/2019   Loud snoring 07/14/2019   Super obesity 07/14/2019   Sleeps in sitting position due to orthopnea 07/14/2019   Chest pain due to GERD 07/14/2019   Nocturia more than twice per night 07/14/2019   Unilateral primary osteoarthritis, right knee 05/26/2019   Chronic migraine without aura without status migrainosus, not intractable 05/13/2019   Class 3 severe obesity due to excess calories without serious comorbidity with body mass index (BMI) of 40.0 to 44.9 in adult (HCC) 08/05/2018   Depression 08/05/2018   Chronic nonintractable headache 08/05/2018   Elevated blood-pressure reading without diagnosis of hypertension 06/06/2018   Chronic pain of right  ankle 04/24/2018   Breast cancer of lower-outer quadrant of right female breast (HCC) 12/02/2014   Bronchospasm 09/21/2014   Dyspnea 09/21/2014   Chronic pain of right knee 09/21/2014   Fibromyalgia 03/06/2013   Dense breasts 03/06/2013   Mastalgia 03/06/2013   Back pain, lumbosacral 03/06/2013   Lymphedema of arm 03/06/2013   Atypical chest pain 01/06/2013   Breast cancer of upper-inner quadrant of left female breast (HCC) 09/08/2012   Family history of malignant neoplasm of breast 09/08/2012   S/P radiation therapy    History of breast cancer in female 08/13/2011   Past Medical History:  Diagnosis Date   Abdominal cramps    Anxiety    Arthritis    breast cancer    s/p radiation- last dose 6/12//has been off Tamoxifen 8/12   Chronic headaches    Costochondritis    Fatigue    Fibromyalgia  03/06/2013   GERD (gastroesophageal reflux disease)    History of COVID-19    Hypertension    Lymphedema    RIGHT ARM-////  STATES USE LEFT ARM FOR BP'S   Migraines    Muscle spasms of head and/or neck    following to the breast   Passed out    4th of july   Personal history of radiation therapy 2010   lt breast    S/P radiation therapy 08/19/07 - 10/10/07   Left Breast/5040 cGy/28 fractions with Boost for a toatl dose of 6300 dGy   S/P radiation therapy 08/14/10 -10/03/10   right breast   Sleep apnea    STOP BANG SCORE 5    Family History  Problem Relation Age of Onset   Breast cancer Mother 62   Heart disease Father    Diabetes Sister    Prostate cancer Maternal Grandfather 42   Cancer Maternal Aunt 63       d. from "female cancer" possibly cervical   Breast cancer Maternal Aunt 60   Prostate cancer Maternal Uncle 78   Prostate cancer Maternal Uncle 80   Prostate cancer Paternal Uncle    Prostate cancer Paternal Uncle    Breast cancer Cousin 30       mat 1st cousin    Past Surgical History:  Procedure Laterality Date   ABDOMINAL HYSTERECTOMY     with left  salpingooophorectomy   BIOPSY  04/30/2022   Procedure: BIOPSY;  Surgeon: Sherrilyn Rist, MD;  Location: [facility];  Service: Gastroenterology;;   BREAST LUMPECTOMY  2009/2012   LEFT/RIGHT with lymph node dissection   COLONOSCOPY WITH PROPOFOL N/A 04/30/2022   Procedure: COLONOSCOPY WITH PROPOFOL;  Surgeon: Sherrilyn Rist, MD;  Location: [facility];  Service: Gastroenterology;  Laterality: N/A;   ESOPHAGOGASTRODUODENOSCOPY (EGD) WITH PROPOFOL N/A 04/30/2022   Procedure: ESOPHAGOGASTRODUODENOSCOPY (EGD) WITH PROPOFOL;  Surgeon: Sherrilyn Rist, MD;  Location: [facility];  Service: Gastroenterology;  Laterality: N/A;   HOT HEMOSTASIS N/A 04/30/2022   Procedure: HOT HEMOSTASIS (ARGON PLASMA COAGULATION/BICAP);  Surgeon: Sherrilyn Rist, MD;  Location: [facility];  Service: Gastroenterology;  Laterality: N/A;   KNEE ARTHROSCOPY     right   OOPHORECTOMY  2013   Social History   Occupational History   Occupation: disability  Tobacco Use   Smoking status: Former    Packs/day: 0.25    Years: 1.00    Additional pack years: 0.00    Total pack years: 0.25    Types: Cigarettes   Smokeless tobacco: Never  Vaping Use   Vaping Use: Never used  Substance and Sexual Activity   Alcohol use: Not Currently   Drug use: No   Sexual activity: Not Currently    Birth control/protection: Surgical

## 2022-09-04 NOTE — Telephone Encounter (Signed)
Left knee gel injection ?

## 2022-09-06 ENCOUNTER — Encounter: Payer: Self-pay | Admitting: Physical Medicine and Rehabilitation

## 2022-09-06 ENCOUNTER — Encounter: Payer: Self-pay | Admitting: Nurse Practitioner

## 2022-09-06 DIAGNOSIS — F32A Depression, unspecified: Secondary | ICD-10-CM

## 2022-09-07 NOTE — Telephone Encounter (Signed)
VOB submitted for durolane, left knee 

## 2022-09-10 ENCOUNTER — Ambulatory Visit: Payer: 59 | Admitting: Cardiology

## 2022-09-10 ENCOUNTER — Other Ambulatory Visit: Payer: Self-pay | Admitting: Nurse Practitioner

## 2022-09-10 ENCOUNTER — Other Ambulatory Visit: Payer: Self-pay | Admitting: Hematology and Oncology

## 2022-09-10 ENCOUNTER — Encounter: Payer: Self-pay | Admitting: Cardiology

## 2022-09-10 VITALS — BP 120/86 | HR 94 | Resp 18 | Ht 62.0 in | Wt 281.8 lb

## 2022-09-10 DIAGNOSIS — I10 Essential (primary) hypertension: Secondary | ICD-10-CM

## 2022-09-10 DIAGNOSIS — Z8673 Personal history of transient ischemic attack (TIA), and cerebral infarction without residual deficits: Secondary | ICD-10-CM

## 2022-09-10 DIAGNOSIS — I429 Cardiomyopathy, unspecified: Secondary | ICD-10-CM | POA: Diagnosis not present

## 2022-09-10 DIAGNOSIS — E782 Mixed hyperlipidemia: Secondary | ICD-10-CM

## 2022-09-10 DIAGNOSIS — Z87891 Personal history of nicotine dependence: Secondary | ICD-10-CM | POA: Diagnosis not present

## 2022-09-10 DIAGNOSIS — G4733 Obstructive sleep apnea (adult) (pediatric): Secondary | ICD-10-CM

## 2022-09-10 DIAGNOSIS — M797 Fibromyalgia: Secondary | ICD-10-CM

## 2022-09-10 MED ORDER — WEGOVY 0.25 MG/0.5ML ~~LOC~~ SOAJ
0.2500 mg | SUBCUTANEOUS | 0 refills | Status: DC
Start: 2022-09-10 — End: 2022-09-18

## 2022-09-10 NOTE — Progress Notes (Signed)
Date:  09/10/2022   ID:  Ashley Lindsey, DOB 09-Apr-1961, MRN [id number]  PCP:  [name], FNP  Cardiologist: Tessa Lerner, DO, [facility]  (established care 11/11/2020)  Date: 09/10/22 Last Office Visit: 12/14/2021  Chief Complaint  Patient presents with   Cardiomyopathy   Follow-up   HPI  Ashley Lindsey is a 62 y.o. female whose past medical history and cardiovascular risk factors include: Cardiomyopathy, history of COVID-19 infection, TIA, HTN, OSA (not on CPAP as Nov 2022), fibromyalgia, history of breast cancer bilaterally status post chemotherapy and radiation, former smoker, obesity due to excess calories.  In the past during her workup for syncope/stroke echocardiogram was noted to have mildly reduced LVEF likely secondary to history of chemotherapy and radiation for bilateral breast cancer.  Prior MPI noted low risk study and cardiac monitor was overall unremarkable.  Patient presents today for 43-month follow-up visit.  In the past patient was started on Entresto, spironolactone, and metoprolol.  However, she was unable to tolerate metoprolol and transition back to propranolol.  Patient was recently started on Topamax by her rheumatologist for weight loss.  However she has been experiencing headaches and palpitations and therefore started taking Entresto every other day.  She is currently in the process of being evaluated for sleep apnea with Dr. Vickey Huger.  Unfortunately she has gained 16 pounds in the last 6 months due to dietary indiscretion.   ALLERGIES: Allergies  Allergen Reactions   Aspirin     unknown   Penicillins Hives and Swelling    Has patient had a PCN reaction causing immediate rash, facial/tongue/throat swelling, SOB or lightheadedness with hypotension: Yes Has patient had a PCN reaction causing severe rash involving mucus membranes or skin necrosis: Yes Has patient had a PCN reaction that required hospitalization Yes Has patient had a PCN reaction occurring  within the last 10 years: No If all of the above answers are "NO", then may proceed with Cephalosporin use.     MEDICATION LIST PRIOR TO VISIT: Current Meds  Medication Sig   acetaminophen (TYLENOL) 650 MG CR tablet Take 650 mg by mouth every 8 (eight) hours as needed for pain.   atorvastatin (LIPITOR) 10 MG tablet TAKE 1 TABLET BY MOUTH EVERY DAY AT NIGHT   Bisacodyl (DULCOLAX PO) Take 1 capsule by mouth daily as needed (constipation).    cholecalciferol 5000 units TABS Take 1 tablet (5,000 Units total) by mouth 2 (two) times daily. (Patient taking differently: Take 5,000 Units by mouth daily.)   cyclobenzaprine (FLEXERIL) 10 MG tablet Take 1 tablet (10 mg total) by mouth 3 (three) times daily.   dexlansoprazole (DEXILANT) 60 MG capsule TAKE 1 CAPSULE BY MOUTH EVERY DAY   diclofenac Sodium (VOLTAREN) 1 % GEL Apply 2 g topically 4 (four) times daily. (Patient taking differently: Apply 2 g topically in the morning and at bedtime.)   diphenhydrAMINE (BENADRYL) 25 MG tablet Take 1 tablet (25 mg total) by mouth every 6 (six) hours as needed for up to 20 doses (migraine). Take with reglan for migraine headache   ELDERBERRY PO Take 1 tablet by mouth daily.   ENTRESTO 24-26 MG TAKE 1 TABLET BY MOUTH TWICE A DAY (Patient taking differently: Take 1 tablet by mouth every other day.)   ferrous sulfate 324 (65 Fe) MG TBEC Take 325 mg by mouth 2 (two) times daily.   fish oil-omega-3 fatty acids 1000 MG capsule Take 1 g by mouth daily.   Flaxseed, Linseed, (FLAXSEED OIL MAX STR PO)  Take 1 capsule by mouth daily.    gabapentin (NEURONTIN) 300 MG capsule TAKE 1 CAPSULE BY MOUTH THREE TIMES A DAY   Magnesium 100 MG TABS Take 1 tablet by mouth daily. Takes 50 mg   meloxicam (MOBIC) 7.5 MG tablet TAKE 1 TABLET BY MOUTH EVERY DAY   Multiple Vitamins-Minerals (MULTIVITAMIN & MINERAL PO) Take 1 tablet by mouth daily.   mupirocin ointment (BACTROBAN) 2 % Apply 1 Application topically 2 (two) times daily. (Patient  taking differently: Apply 1 Application topically as needed.)   ondansetron (ZOFRAN) 4 MG tablet TAKE 1 TABLET BY MOUTH DAILY AS NEEDED FOR NAUSEA OR VOMITING   OVER THE COUNTER MEDICATION Take 125 mg by mouth daily. Antigas/Simethicone   polyethylene glycol (MIRALAX / GLYCOLAX) 17 g packet Take 17 g by mouth daily as needed.   propranolol (INDERAL) 10 MG tablet TAKE 1 TABLET BY MOUTH TWICE A DAY *HOLD IF SYSTOLIC BP (TOP #) IS <100MMHG OR PULSE IS <60BPM (Patient taking differently: Take 10 mg by mouth at bedtime.)   Semaglutide-Weight Management (WEGOVY) 0.25 MG/0.5ML SOAJ Inject 0.25 mg into the skin once a week for 4 doses.   spironolactone (ALDACTONE) 25 MG tablet TAKE 1 TABLET BY MOUTH EVERY DAY IN THE MORNING   SYMBICORT 80-4.5 MCG/ACT inhaler INHALE 2 PUFFS INTO THE LUNGS IN THE MORNING AND AT BEDTIME.   topiramate (TOPAMAX) 50 MG tablet Take 1 tablet (50 mg total) by mouth at bedtime.   triamcinolone cream (KENALOG) 0.1 % APPLY 1 APPLICATION TOPICALLY 2 (TWO) TIMES DAILY AS NEEDED (RASH).   Turmeric 500 MG CAPS Take by mouth.   venlafaxine XR (EFFEXOR-XR) 150 MG 24 hr capsule TAKE 1 CAPSULE BY MOUTH NIGHTLY WITH 75MG  CAPSULE FOR A TOTAL OF 225MG  NIGHTLY   venlafaxine XR (EFFEXOR-XR) 75 MG 24 hr capsule TAKE 1 CAPSULE BY MOUTH DAILY IN COMBINATION WITH XR 150 MG (TO EQUAL 225 MG TOTAL DAILY).   Vitamin D, Ergocalciferol, (DRISDOL) 1.25 MG (50000 UNIT) CAPS capsule TAKE 1 CAPSULE BY MOUTH EVERY 7 DAYS   vitamin E 200 UNIT capsule Take 200 Units by mouth daily.   [DISCONTINUED] losartan (COZAAR) 25 MG tablet Take by mouth daily.     PAST MEDICAL HISTORY: Past Medical History:  Diagnosis Date   Abdominal cramps    Anxiety    Arthritis    breast cancer    s/p radiation- last dose 6/12//has been off Tamoxifen 8/12   Chronic headaches    Costochondritis    Fatigue    Fibromyalgia 03/06/2013   GERD (gastroesophageal reflux disease)    History of COVID-19    Hypertension     Lymphedema    RIGHT ARM-////  STATES USE LEFT ARM FOR BP'S   Migraines    Muscle spasms of head and/or neck    following to the breast   Passed out    4th of july   Personal history of radiation therapy 2010   lt breast    S/P radiation therapy 08/19/07 - 10/10/07   Left Breast/5040 cGy/28 fractions with Boost for a toatl dose of 6300 dGy   S/P radiation therapy 08/14/10 -10/03/10   right breast   Sleep apnea    STOP BANG SCORE 5    PAST SURGICAL HISTORY: Past Surgical History:  Procedure Laterality Date   ABDOMINAL HYSTERECTOMY     with left salpingooophorectomy   BIOPSY  04/30/2022   Procedure: BIOPSY;  Surgeon: Sherrilyn Rist, MD;  Location: [facility];  Service:  Gastroenterology;;   BREAST LUMPECTOMY  2009/2012   LEFT/RIGHT with lymph node dissection   COLONOSCOPY WITH PROPOFOL N/A 04/30/2022   Procedure: COLONOSCOPY WITH PROPOFOL;  Surgeon: Sherrilyn Rist, MD;  Location: [facility];  Service: Gastroenterology;  Laterality: N/A;   ESOPHAGOGASTRODUODENOSCOPY (EGD) WITH PROPOFOL N/A 04/30/2022   Procedure: ESOPHAGOGASTRODUODENOSCOPY (EGD) WITH PROPOFOL;  Surgeon: Sherrilyn Rist, MD;  Location: [facility];  Service: Gastroenterology;  Laterality: N/A;   HOT HEMOSTASIS N/A 04/30/2022   Procedure: HOT HEMOSTASIS (ARGON PLASMA COAGULATION/BICAP);  Surgeon: Sherrilyn Rist, MD;  Location: [facility];  Service: Gastroenterology;  Laterality: N/A;   KNEE ARTHROSCOPY     right   OOPHORECTOMY  2013    FAMILY HISTORY: The patient family history includes Breast cancer (age of onset: 30) in her cousin; Breast cancer (age of onset: 54) in her maternal aunt; Breast cancer (age of onset: 71) in her mother; Cancer (age of onset: 25) in her maternal aunt; Diabetes in her sister; Heart disease in her father; Prostate cancer in her paternal uncle and paternal uncle; Prostate cancer (age of onset: 68) in her maternal uncle; Prostate cancer (age of onset: 37) in her maternal uncle;  Prostate cancer (age of onset: 11) in her maternal grandfather.  SOCIAL HISTORY:  The patient  reports that she has quit smoking. Her smoking use included cigarettes. She has a 0.25 pack-year smoking history. She has never used smokeless tobacco. She reports that she does not currently use alcohol. She reports that she does not use drugs.  REVIEW OF SYSTEMS: Review of Systems  Constitutional: Positive for weight gain.  Cardiovascular:  Positive for dyspnea on exertion, orthopnea (chronic) and palpitations. Negative for chest pain, claudication, irregular heartbeat, leg swelling, near-syncope, paroxysmal nocturnal dyspnea and syncope.  Respiratory:  Negative for shortness of breath.   Hematologic/Lymphatic: Negative for bleeding problem.  Musculoskeletal:  Negative for muscle cramps and myalgias.  Neurological:  Negative for dizziness and light-headedness.    PHYSICAL EXAM:    09/10/2022    2:58 PM 09/04/2022   11:02 AM 07/26/2022    9:35 AM  Vitals with BMI  Height 5\' 2"  5\' 2"  5\' 2"   Weight 281 lbs 13 oz 288 lbs 6 oz 279 lbs  BMI 51.53 52.74 51.02  Systolic 120 121 528  Diastolic 86 75 68  Pulse 94 94 68   Physical Exam  Constitutional: No distress.  Appears older than stated age, hemodynamically stable.   Neck:  Unable to evaluate JVP due to short neck stature and adipose tissue  Cardiovascular: Normal rate, regular rhythm, S1 normal, S2 normal, intact distal pulses and normal pulses. Exam reveals no gallop, no S3 and no S4.  No murmur heard. Pulmonary/Chest: Effort normal and breath sounds normal. No stridor. She has no wheezes. She has no rales.  Abdominal: Soft. Bowel sounds are normal. She exhibits no distension. There is no abdominal tenderness.  Obese  Musculoskeletal:        General: No edema.     Cervical back: Neck supple.  Neurological: She is alert and oriented to person, place, and time. She has intact cranial nerves (2-12).  Skin: Skin is warm and moist.    CARDIAC DATABASE: EKG: Sep 10, 2022: Normal sinus rhythm, 91 bpm, LAE, LVH per voltage criteria, without underlying injury pattern.  Echo:  12/08/2020:  Endocardial definition is not well visualized. This may limit accuracy.  Consider alternate imaging studies, if clinically indicated.   Left  ventricle cavity is  normal in size. Mild concentric hypertrophy of the left ventricle. Mild global hypokinesis. LVEF probably 45%. Doppler evidence of grade I (impaired) diastolic dysfunction, normal LAP.  Left atrial cavity is mildly dilated.  Mild (Grade I) mitral regurgitation.  Mild tricuspid regurgitation.  No evidence of pulmonary hypertension.  Stress Test: Lexiscan Tetrofosmin stress test 12/05/2020: Lexiscan nuclear stress test performed using 1-day protocol. Mildly decreased tracer uptake in inferior myocardium, likely due to gut attenuation. Stress LVEF 62%. Low risk study.   CTA Head and Neck w/ and w/o contrast: 09/11/2020: 1. CT perfusion negative for acute infarct or ischemia 2. Negative for intracranial large vessel occlusion 3. No significant carotid or vertebral artery stenosis in the neck. No intracranial stenosis. 4. Code stroke imaging results were communicated on 09/11/2020 at 2:34 pm to provider [place] via text page  CT Head w/o contrast: 09/11/2020 1. Negative CT head 2. ASPECTS is 10 3. Code stroke imaging results were communicated on 09/11/2020 at 1:59 pm to provider Wilford Corner via text page  MRI brain with and without contrast: 10/04/2020 1. No specific cause for symptoms.  No acute or subacute infarct. 2. Mild white matter disease without specific demyelinating pattern.  Carotid artery duplex 12/08/2020:  The bifurcation, internal, external and common carotid arteries reveal no  evidence of significant stenosis, bilaterally. No significant plaque  burden noted bilaterally.  Right vertebral artery flow is not visualized. Left vertebral artery flow  is not  visualized.  14-day mobile cardiac ambulatory telemetry: Patch Wear Time: 12 days and 0 hours  Dominant rhythm normal sinus, followed by sinus tachycardia (15% burden).  Heart rate 68-203 bpm. Avg HR 91 bpm. No atrial fibrillation, ventricular tachycardia, high grade AV block, pauses (3 seconds or longer). One episode of SVT, 5 beat duration, max HR of 203 bpm, and average 191 bpm.  Total ventricular ectopic burden <1%. Total supraventricular ectopic burden <1%. Patient triggered events: 6. These did not correlate with any significant dysrhythmias.  LABORATORY DATA:    Latest Ref Rng & Units 07/05/2022   12:15 PM 04/12/2022    4:13 PM 03/05/2022   11:10 AM  CBC  WBC 4.0 - 10.5 K/uL 8.9  9.6  10.0   Hemoglobin 12.0 - 15.0 g/dL 16.1  09.6  8.2   Hematocrit 36.0 - 46.0 % 39.3  36.7  29.2   Platelets 150.0 - 400.0 K/uL 351.0  293.0  259        Latest Ref Rng & Units 03/29/2022   11:34 AM 03/05/2022   11:10 AM 10/16/2021   12:44 PM  CMP  Glucose 70 - 99 mg/dL  95  80   BUN 8 - 27 mg/dL  9  12   Creatinine 0.45 - 1.00 mg/dL  4.09  8.11   Sodium 914 - 144 mmol/L  140  138   Potassium 3.5 - 5.2 mmol/L  4.6  4.5   Chloride 96 - 106 mmol/L  103  98   CO2 20 - 29 mmol/L  26  24   Calcium 8.7 - 10.3 mg/dL  9.4  9.3   Total Protein 6.0 - 8.5 g/dL 7.4     Total Bilirubin 0.0 - 1.2 mg/dL <7.8     Alkaline Phos 44 - 121 IU/L 76     AST 0 - 40 IU/L 16     ALT 0 - 32 IU/L 13       Lipid Panel     Component Value Date/Time   CHOL 203 (H) 06/21/2022  1229   TRIG 193 (H) 06/21/2022 1229   HDL 63 06/21/2022 1229   CHOLHDL 3.2 06/21/2022 1229   LDLCALC 107 (H) 06/21/2022 1229   LABVLDL 33 06/21/2022 1229    No components found for: "NTPROBNP" No results for input(s): "PROBNP" in the last 8760 hours. No results for input(s): "TSH" in the last 8760 hours.  BMP Recent Labs    10/16/21 1244 03/05/22 1110  NA 138 140  K 4.5 4.6  CL 98 103  CO2 24 26  GLUCOSE 80 95  BUN 12 9   CREATININE 0.66 0.71  CALCIUM 9.3 9.4    HEMOGLOBIN A1C Lab Results  Component Value Date   HGBA1C 5.8 (H) 03/05/2022     IMPRESSION:    ICD-10-CM   1. Cardiomyopathy, unspecified type (HCC)  I42.9 EKG 12-Lead    MR CARDIAC MORPHOLOGY W WO CONTRAST    Semaglutide-Weight Management (WEGOVY) 0.25 MG/0.5ML SOAJ    CMP14+EGFR    Pro b natriuretic peptide (BNP)    Hemoglobin and hematocrit, blood    2. OSA (obstructive sleep apnea)  G47.33     3. Benign hypertension  I10 Semaglutide-Weight Management (WEGOVY) 0.25 MG/0.5ML SOAJ    4. Mixed hyperlipidemia  E78.2     5. Class 3 severe obesity due to excess calories with serious comorbidity and body mass index (BMI) of 50.0 to 59.9 in adult (HCC)  E66.01 Semaglutide-Weight Management (WEGOVY) 0.25 MG/0.5ML SOAJ   Z68.43     6. History of TIA (transient ischemic attack)  Z86.73     7. Former smoker  Z87.891        RECOMMENDATIONS: Ashley Lindsey is a 62 y.o. female whose past medical history and cardiac risk factors include: Cardiomyopathy, history of COVID-19 infection, TIA, HTN, OSA (not on CPAP yet dx in June 2022), fibromyalgia, history of breast cancer bilaterally status post chemotherapy and radiation, former smoker, obesity due to excess calories.  Cardiomyopathy, unspecified type (HCC) Mildly reduced LVEF likely secondary to prior history of chemotherapy/radiation for breast cancer. Stage B, NYHA class II. Echo: 45%, grade 1 diastolic impairment, mild LAE, mild MR/TR. MPI: Low risk. Medications reconciled. Unable to tolerate metoprolol and therefore propranolol. Recommended to hold Topamax given her headaches and given her cardiomyopathy We will [facility] a [facility] for [facility] if filled - she is asked to come to in for medication administration.  cMRI to reevaluate LVEF and other possibilities for her underlying cardiomyopathy. Check BNP and H&H Lipids are followed / managed by PCP.   OSA (obstructive sleep  apnea) Currently seeing Dr. Vickey Huger and awaiting device therapy.   Benign hypertension Office blood pressures are very well controlled. No changes for now.   Pulmonary nodule CT PE protocol in the past there is a 2 mm nodule in the left lower lobe.  Given her history of smoking I have recommended her to follow-up with pulmonary medicine.  FINAL MEDICATION LIST END OF ENCOUNTER: Meds ordered this encounter  Medications   Semaglutide-Weight Management (WEGOVY) 0.25 MG/0.5ML SOAJ    Sig: Inject 0.25 mg into the skin once a week for 4 doses.    Dispense:  2 mL    Refill:  0     Medications Discontinued During This Encounter  Medication Reason   albuterol (VENTOLIN HFA) 108 (90 Base) MCG/ACT inhaler    benzonatate (TESSALON PERLES) 100 MG capsule    Semaglutide-Weight Management 0.5 MG/0.5ML SOAJ    Semaglutide-Weight Management 1 MG/0.5ML SOAJ    Semaglutide-Weight Management 1.7  MG/0.75ML SOAJ    Semaglutide-Weight Management 2.4 MG/0.75ML SOAJ    topiramate (TOPAMAX) 25 MG tablet    WEGOVY 0.25 MG/0.5ML SOAJ    losartan (COZAAR) 25 MG tablet       Current Outpatient Medications:    acetaminophen (TYLENOL) 650 MG CR tablet, Take 650 mg by mouth every 8 (eight) hours as needed for pain., Disp: , Rfl:    atorvastatin (LIPITOR) 10 MG tablet, TAKE 1 TABLET BY MOUTH EVERY DAY AT NIGHT, Disp: 90 tablet, Rfl: 1   Bisacodyl (DULCOLAX PO), Take 1 capsule by mouth daily as needed (constipation). , Disp: , Rfl:    cholecalciferol 5000 units TABS, Take 1 tablet (5,000 Units total) by mouth 2 (two) times daily. (Patient taking differently: Take 5,000 Units by mouth daily.), Disp: , Rfl:    cyclobenzaprine (FLEXERIL) 10 MG tablet, Take 1 tablet (10 mg total) by mouth 3 (three) times daily., Disp: 90 tablet, Rfl: 1   dexlansoprazole (DEXILANT) 60 MG capsule, TAKE 1 CAPSULE BY MOUTH EVERY DAY, Disp: 90 capsule, Rfl: 1   diclofenac Sodium (VOLTAREN) 1 % GEL, Apply 2 g topically 4 (four) times  daily. (Patient taking differently: Apply 2 g topically in the morning and at bedtime.), Disp: 2 g, Rfl: 3   diphenhydrAMINE (BENADRYL) 25 MG tablet, Take 1 tablet (25 mg total) by mouth every 6 (six) hours as needed for up to 20 doses (migraine). Take with reglan for migraine headache, Disp: 20 tablet, Rfl: 0   ELDERBERRY PO, Take 1 tablet by mouth daily., Disp: , Rfl:    ENTRESTO 24-26 MG, TAKE 1 TABLET BY MOUTH TWICE A DAY (Patient taking differently: Take 1 tablet by mouth every other day.), Disp: 180 tablet, Rfl: 0   ferrous sulfate 324 (65 Fe) MG TBEC, Take 325 mg by mouth 2 (two) times daily., Disp: , Rfl:    fish oil-omega-3 fatty acids 1000 MG capsule, Take 1 g by mouth daily., Disp: , Rfl:    Flaxseed, Linseed, (FLAXSEED OIL MAX STR PO), Take 1 capsule by mouth daily. , Disp: , Rfl:    gabapentin (NEURONTIN) 300 MG capsule, TAKE 1 CAPSULE BY MOUTH THREE TIMES A DAY, Disp: 90 capsule, Rfl: 3   Magnesium 100 MG TABS, Take 1 tablet by mouth daily. Takes 50 mg, Disp: , Rfl:    meloxicam (MOBIC) 7.5 MG tablet, TAKE 1 TABLET BY MOUTH EVERY DAY, Disp: 90 tablet, Rfl: 0   Multiple Vitamins-Minerals (MULTIVITAMIN & MINERAL PO), Take 1 tablet by mouth daily., Disp: , Rfl:    mupirocin ointment (BACTROBAN) 2 %, Apply 1 Application topically 2 (two) times daily. (Patient taking differently: Apply 1 Application topically as needed.), Disp: 22 g, Rfl: 0   ondansetron (ZOFRAN) 4 MG tablet, TAKE 1 TABLET BY MOUTH DAILY AS NEEDED FOR NAUSEA OR VOMITING, Disp: 30 tablet, Rfl: 1   OVER THE COUNTER MEDICATION, Take 125 mg by mouth daily. [place]ig[place]/Simethicone, Disp: , Rfl:    polyethylene glycol (MIRALAX / GLYCOLAX) 17 g packet, Take 17 g by mouth daily as needed., Disp: , Rfl:    propranolol (INDERAL) 10 MG tablet, TAKE 1 TABLET BY MOUTH TWICE A DAY *HOLD IF SYSTOLIC BP (TOP #) IS <100MMHG OR PULSE IS <60BPM (Patient taking differently: Take 10 mg by mouth at bedtime.), Disp: 180 tablet, Rfl: 1    Semaglutide-Weight Management (WEGOVY) 0.25 MG/0.5ML SOAJ, Inject 0.25 mg into the skin once a week for 4 doses., Disp: 2 mL, Rfl: 0   spironolactone (ALDACTONE)  25 MG tablet, TAKE 1 TABLET BY MOUTH EVERY DAY IN THE MORNING, Disp: 90 tablet, Rfl: 0   SYMBICORT 80-4.5 MCG/ACT inhaler, INHALE 2 PUFFS INTO THE LUNGS IN THE MORNING AND AT BEDTIME., Disp: 30.6 each, Rfl: 1   topiramate (TOPAMAX) 50 MG tablet, Take 1 tablet (50 mg total) by mouth at bedtime., Disp: 90 tablet, Rfl: 3   triamcinolone cream (KENALOG) 0.1 %, APPLY 1 APPLICATION TOPICALLY 2 (TWO) TIMES DAILY AS NEEDED (RASH)., Disp: 60 g, Rfl: 1   Turmeric 500 MG CAPS, Take by mouth., Disp: , Rfl:    venlafaxine XR (EFFEXOR-XR) 150 MG 24 hr capsule, TAKE 1 CAPSULE BY MOUTH NIGHTLY WITH 75MG  CAPSULE FOR A TOTAL OF 225MG  NIGHTLY, Disp: 90 capsule, Rfl: 3   venlafaxine XR (EFFEXOR-XR) 75 MG 24 hr capsule, TAKE 1 CAPSULE BY MOUTH DAILY IN COMBINATION WITH XR 150 MG (TO EQUAL 225 MG TOTAL DAILY)., Disp: 90 capsule, Rfl: 2   Vitamin D, Ergocalciferol, (DRISDOL) 1.25 MG (50000 UNIT) CAPS capsule, TAKE 1 CAPSULE BY MOUTH EVERY 7 DAYS, Disp: 20 capsule, Rfl: 0   vitamin E 200 UNIT capsule, Take 200 Units by mouth daily., Disp: , Rfl:   Orders Placed This Encounter  Procedures   MR CARDIAC MORPHOLOGY W WO CONTRAST   CMP14+EGFR   Pro b natriuretic peptide (BNP)   Hemoglobin and hematocrit, blood   EKG 12-Lead    There are no Patient Instructions on file for this visit.   --Continue cardiac medications as reconciled in final medication list. --Return in about 6 weeks (around 10/22/2022) for Follow up [facility] initiation, cardiomyopathy, review test results. Or sooner if needed. --Continue follow-up with your primary care physician regarding the management of your other chronic comorbid conditions.  Patient's questions and concerns were addressed to her satisfaction. She voices understanding of the instructions provided during this encounter.   This  note was created using a voice recognition software as a result there may be grammatical errors inadvertently enclosed that do not reflect the nature of this encounter. Every attempt is made to correct such errors.  Tessa Lerner, [place], [facility]  Pager:  [phone] Office: [phone]

## 2022-09-11 ENCOUNTER — Telehealth: Payer: Self-pay | Admitting: *Deleted

## 2022-09-11 ENCOUNTER — Encounter: Payer: Self-pay | Admitting: Physical Medicine and Rehabilitation

## 2022-09-11 NOTE — Telephone Encounter (Signed)
Ashley Lindsey is not a covered medication. PA attempted and denied in April. Pharmacy requesting alternative.

## 2022-09-12 ENCOUNTER — Telehealth: Payer: Self-pay

## 2022-09-12 NOTE — Telephone Encounter (Signed)
Pharmacy sent fax stating that [facility] non formulary. Requesting alternative

## 2022-09-17 DIAGNOSIS — C50912 Malignant neoplasm of unspecified site of left female breast: Secondary | ICD-10-CM | POA: Diagnosis not present

## 2022-09-18 ENCOUNTER — Telehealth: Payer: Self-pay | Admitting: Neurology

## 2022-09-18 ENCOUNTER — Encounter: Payer: 59 | Attending: Physical Medicine and Rehabilitation | Admitting: Physical Medicine and Rehabilitation

## 2022-09-18 DIAGNOSIS — R519 Headache, unspecified: Secondary | ICD-10-CM | POA: Diagnosis not present

## 2022-09-18 DIAGNOSIS — G8929 Other chronic pain: Secondary | ICD-10-CM | POA: Diagnosis not present

## 2022-09-18 DIAGNOSIS — H9191 Unspecified hearing loss, right ear: Secondary | ICD-10-CM

## 2022-09-18 DIAGNOSIS — H9192 Unspecified hearing loss, left ear: Secondary | ICD-10-CM

## 2022-09-18 MED ORDER — RYBELSUS 3 MG PO TABS: 1.0000 | ORAL_TABLET | Freq: Every day | ORAL | 3 refills | Status: DC

## 2022-09-18 NOTE — Addendum Note (Signed)
Addended by: Horton Chin on: 09/18/2022 12:15 PM   Modules accepted: Orders, Level of Service

## 2022-09-18 NOTE — Telephone Encounter (Signed)
HST- [employer] medicare no auth req   Patient is scheduled at [facility] for 10/10/22 at 11 AM.  Mailed packet to the patient.

## 2022-09-18 NOTE — Progress Notes (Addendum)
Subjective:    Patient ID: Ashley Lindsey, female    DOB: August 18, 1960, 62 y.o.   MRN: [id number]  HPI An audio/video tele-health visit is felt to be the most appropriate encounter for this patient at this time. This is a follow up tele-visit via phone. The patient is at home. MD is at office. Prior to scheduling this appointment, our staff discussed the limitations of evaluation and management by telemedicine and the availability of in-person appointments. The patient expressed understanding and agreed to proceed.   Ashley Lindsey is a 62 year old woman who presents for f/u of lymphedema, lower extremity pain, and fibromyalgia  1) LUE lymphedema -garment was too expensive -when it flares it gives her no warning -she received a booklet and she does the therapy and sometimes it works and sometimes it doesn't -worse in the biceps -aches and wakes her up  2) Ashley Lindsey -working out in [employer] and [employer] -up and down with emotions -had to put her uncle in a nursing home and this circles her -she is cleaning out her uncle's room -she has had more flares recently.   3) Lower extremity pain -she sometimes feels that it radiates from her back -has not had any recent spinal imaging -she would be interested in increasing her dose of topamax  4) Obesity -she asks for alternatives to wegivy  5) Headaches: -taking tylenol but is wary of taking too much  Pain Inventory Average Pain 8 Pain Right Now 8 My pain is constant, dull, and aching  In the last 24 hours, has pain interfered with the following? General activity 9 Relation with others 9 Enjoyment of life 9 What TIME of day is your pain at its worst? varies Sleep (in general) Fair  Pain is worse with: walking, sitting, inactivity, standing, and some activites Pain improves with: rest, heat/ice, therapy/exercise, pacing activities, medication, and injections Relief from Meds: 9  Family History  Problem Relation Age of  Onset   Breast cancer Mother 18   Heart disease Father    Diabetes Sister    Prostate cancer Maternal Grandfather 31   Cancer Maternal Aunt 63       d. from "female cancer" possibly cervical   Breast cancer Maternal Aunt 60   Prostate cancer Maternal Uncle 78   Prostate cancer Maternal Uncle 80   Prostate cancer Paternal Uncle    Prostate cancer Paternal Uncle    Breast cancer Cousin 30       mat 1st cousin   Social History   Socioeconomic History   Marital status: Divorced    Spouse name: Not on file   Number of children: 1   Years of education: Not on file   Highest education level: Not on file  Occupational History   Occupation: disability  Tobacco Use   Smoking status: Former    Packs/day: 0.25    Years: 1.00    Additional pack years: 0.00    Total pack years: 0.25    Types: Cigarettes   Smokeless tobacco: Never  Vaping Use   Vaping Use: Never used  Substance and Sexual Activity   Alcohol use: Not Currently   Drug use: No   Sexual activity: Not Currently    Birth control/protection: Surgical  Other Topics Concern   Not on file  Social History Narrative   Right handed    Coffee daily, sometimes Ginger ale    Social Determinants of Health   Financial Resource Strain: Low Risk  (  02/08/2022)   Overall Financial Resource Strain (CARDIA)    Difficulty of Paying Living Expenses: Not hard at all  Food Insecurity: No Food Insecurity (02/08/2022)   Hunger Vital Sign    Worried About Running Out of Food in the Last Year: Never true    Ran Out of Food in the Last Year: Never true  Transportation Needs: No Transportation Needs (02/08/2022)   PRAPARE - Administrator, Civil Service (Medical): No    Lack of Transportation (Non-Medical): No  Physical Activity: Inactive (02/08/2022)   Exercise Vital Sign    Days of Exercise per Week: 0 days    Minutes of Exercise per Session: 0 min  Stress: No Stress Concern Present (02/08/2022)   [name] of  Occupational Health - Occupational Stress Questionnaire    Feeling of Stress : Not at all  Social Connections: Not on file   Past Surgical History:  Procedure Laterality Date   ABDOMINAL HYSTERECTOMY     with left salpingooophorectomy   BIOPSY  04/30/2022   Procedure: BIOPSY;  Surgeon: Sherrilyn Rist, MD;  Location: [facility];  Service: Gastroenterology;;   BREAST LUMPECTOMY  2009/2012   LEFT/RIGHT with lymph node dissection   COLONOSCOPY WITH PROPOFOL N/A 04/30/2022   Procedure: COLONOSCOPY WITH PROPOFOL;  Surgeon: Sherrilyn Rist, MD;  Location: [facility];  Service: Gastroenterology;  Laterality: N/A;   ESOPHAGOGASTRODUODENOSCOPY (EGD) WITH PROPOFOL N/A 04/30/2022   Procedure: ESOPHAGOGASTRODUODENOSCOPY (EGD) WITH PROPOFOL;  Surgeon: Sherrilyn Rist, MD;  Location: [facility];  Service: Gastroenterology;  Laterality: N/A;   HOT HEMOSTASIS N/A 04/30/2022   Procedure: HOT HEMOSTASIS (ARGON PLASMA COAGULATION/BICAP);  Surgeon: Sherrilyn Rist, MD;  Location: [facility];  Service: Gastroenterology;  Laterality: N/A;   KNEE ARTHROSCOPY     right   OOPHORECTOMY  2013   Past Surgical History:  Procedure Laterality Date   ABDOMINAL HYSTERECTOMY     with left salpingooophorectomy   BIOPSY  04/30/2022   Procedure: BIOPSY;  Surgeon: Sherrilyn Rist, MD;  Location: [facility];  Service: Gastroenterology;;   BREAST LUMPECTOMY  2009/2012   LEFT/RIGHT with lymph node dissection   COLONOSCOPY WITH PROPOFOL N/A 04/30/2022   Procedure: COLONOSCOPY WITH PROPOFOL;  Surgeon: Sherrilyn Rist, MD;  Location: [facility];  Service: Gastroenterology;  Laterality: N/A;   ESOPHAGOGASTRODUODENOSCOPY (EGD) WITH PROPOFOL N/A 04/30/2022   Procedure: ESOPHAGOGASTRODUODENOSCOPY (EGD) WITH PROPOFOL;  Surgeon: Sherrilyn Rist, MD;  Location: [facility];  Service: Gastroenterology;  Laterality: N/A;   HOT HEMOSTASIS N/A 04/30/2022   Procedure: HOT HEMOSTASIS (ARGON PLASMA COAGULATION/BICAP);   Surgeon: Sherrilyn Rist, MD;  Location: [facility];  Service: Gastroenterology;  Laterality: N/A;   KNEE ARTHROSCOPY     right   OOPHORECTOMY  2013   Past Medical History:  Diagnosis Date   Abdominal cramps    Anxiety    Arthritis    breast cancer    s/p radiation- last dose 6/12//has been off Tamoxifen 8/12   Chronic headaches    Costochondritis    Fatigue    Fibromyalgia 03/06/2013   GERD (gastroesophageal reflux disease)    History of COVID-19    Hypertension    Lymphedema    RIGHT ARM-////  STATES USE LEFT ARM FOR BP'S   Migraines    Muscle spasms of head and/or neck    following to the breast   Passed out    4th of july   Personal history of  radiation therapy 2010   lt breast    S/P radiation therapy 08/19/07 - 10/10/07   Left Breast/5040 cGy/28 fractions with Boost for a toatl dose of 6300 dGy   S/P radiation therapy 08/14/10 -10/03/10   right breast   Sleep apnea    STOP BANG SCORE 5   There were no vitals taken for this visit.  Opioid Risk Score:   Fall Risk Score:  `1  Depression screen [facility] 2/9     02/08/2022   10:11 AM 01/16/2022    9:14 AM 12/19/2021   10:39 AM 07/06/2021   10:17 AM 02/02/2021    9:45 AM 01/11/2021    2:05 PM 12/02/2019    2:55 PM  Depression screen PHQ 2/9  Decreased Interest 0 0 0 1 1 0 1  Down, Depressed, Hopeless 0 0 0 1 1 0 1  PHQ - 2 Score 0 0 0 2 2 0 2  Altered sleeping       1  Tired, decreased energy       3  Change in appetite       1  Feeling bad or failure about yourself        0  Trouble concentrating       3  Moving slowly or fidgety/restless       0  Suicidal thoughts       0  PHQ-9 Score       10  Difficult doing work/chores       Somewhat difficult     Review of Systems  Musculoskeletal:  Positive for neck pain.       B/L hips knee finger tip pain RT shoulder/arm  All other systems reviewed and are negative.      Objective:   Physical Exam Not performed      Assessment & Plan:   1) LUE  lymphedema -will give script for garment -commended her on doing her lymphedema exercises -continue tiger balm  2) Fibromyalgia -discussed her family stressors -continue tylenol -continue gabapentin, discussed can double of dose for severe flares.  -Discussed current symptoms of pain and history of pain.  -Discussed benefits of exercise in reducing pain. -Discussed following foods that may reduce pain: 1) Ginger (especially studied for arthritis)- reduce leukotriene production to decrease inflammation 2) Blueberries- high in phytonutrients that decrease inflammation 3) Salmon- marine omega-3s reduce joint swelling and pain 4) Pumpkin seeds- reduce inflammation 5) dark chocolate- reduces inflammation 6) turmeric- reduces inflammation 7) tart cherries - reduce pain and stiffness 8) extra virgin olive oil - its compound olecanthal helps to block prostaglandins  9) chili peppers- can be eaten or applied topically via capsaicin 10) mint- helpful for headache, muscle aches, joint pain, and itching 11) garlic- reduces inflammation  Link to further information on diet for chronic pain: [email or web address].[email or web address]   3) Right 5th digit DIP OA: -XR ordered  4) Bilateral knee pain -RF and ANA ordered.   5) Obesity: -prescribed oral semglutide for weight loss -Educated that current weight is 274 lbs and current BMI is 50.12 Discussed following criteria for Wegovy: 1) Patient has diagnosis of obesity; 2) Patient must be 68 years of age or older; 3) The patient has been involved in a physician or dietitian monitored weight loss program, consisting of both low-calorie diet, increased physical activity and behavioral counseling for a minimum of 6 months without a 3% loss from baseline; 4) Patients BMI is one of the following: a) 30kg/m or greater; b) 27 29.99 kg/m  in the presence of at least one weight related comorbidity such as  coronary heart disease, dyslipidemia, hypertension, symptomatic arthritis of the lower extremities, type 2 diabetes mellitus, obstructive sleep apnea; c) 25-29.9 kg/m2 and waist circumference is &gt; 40 inches for males or &gt; 35 inches for females;5) Not Met - Must have tried and failed at least one preferred oral agent such as Phentermine; 6) Patient has no labeled contraindication; 7) Patient is not taking another weight loss medication; 8) The dose is within approved product labeling guidelines; 10) The patient must not be taking another GLP-1 receptor agonist AND the patient must not be taking insulin concurrently.  -prescribed ZOXWRU but discussed that this was denied -increase topamax HS to 50mg  -referred to nutrition -recommended resistance training and prioritizing protein intake to avoid muscle loss while taking this medication -discussed risks of thyroid cancer and gastroparesis  -Educated regarding health benefits of weight loss- for pain, general health, chronic disease prevention, immune health, mental health.  -Will monitor weight every visit.  -Consider Roobois tea daily.  -Discussed the benefits of intermittent fasting. -Discussed foods that can assist in weight loss: 1) leafy greens- high in fiber and nutrients 2) dark chocolate- improves metabolism (if prefer sweetened, best to sweeten with honey instead of sugar).  3) cruciferous vegetables- high in fiber and protein 4) full fat yogurt: high in healthy fat, protein, calcium, and probiotics 5) apples- high in a variety of phytochemicals 6) nuts- high in fiber and protein that increase feelings of fullness 7) grapefruit: rich in nutrients, antioxidants, and fiber (not to be taken with anticoagulation) 8) beans- high in protein and fiber 9) salmon- has high quality protein and healthy fats 10) green tea- rich in polyphenols 11) eggs- rich in choline and vitamin D 12) tuna- high protein, boosts metabolism 13) avocado-  decreases visceral abdominal fat 14) chicken (pasture raised): high in protein and iron 15) blueberries- reduce abdominal fat and cholesterol 16) whole grains- decreases calories retained during digestion, speeds metabolism 17) chia seeds- curb appetite 18) chilies- increases fat metabolism  -Discussed supplements that can be used:  1) Metatrim 400mg  BID 30 minutes before breakfast and dinner  2) Sphaeranthus indicus and Garcinia mangostana (combinations of these and #1 can be found in capsicum and zychrome  3) green coffee bean extract 400mg  twice per day or Irvingia (african mango) 150 to 300mg  twice per day.  6) Iron deficiency anemia: -discussed that when she supplemented she felt improved energy.   7) Radicular lumbar pain: -lumbar XR ordered -increase topamax HS to 50mg   8) Headaches: -discussed that topamax is usually beneficial for headaches -discussed home sleep apnea testing  9) depression: -discussed that it embarrassed her to have to give up her job  22 minutes spent in discussion of her headaches, that topamax can help for headaches but that her cardiologist preferred the [facility] for her, encouraged home sleep apnea testing

## 2022-09-19 ENCOUNTER — Other Ambulatory Visit: Payer: Self-pay

## 2022-09-19 ENCOUNTER — Ambulatory Visit (INDEPENDENT_AMBULATORY_CARE_PROVIDER_SITE_OTHER): Payer: 59 | Admitting: Physician Assistant

## 2022-09-19 ENCOUNTER — Telehealth: Payer: Self-pay

## 2022-09-19 DIAGNOSIS — M1712 Unilateral primary osteoarthritis, left knee: Secondary | ICD-10-CM

## 2022-09-19 DIAGNOSIS — M1711 Unilateral primary osteoarthritis, right knee: Secondary | ICD-10-CM

## 2022-09-19 MED ORDER — SODIUM HYALURONATE 60 MG/3ML IX PRSY
60.0000 mg | PREFILLED_SYRINGE | INTRA_ARTICULAR | Status: AC | PRN
Start: 2022-09-19 — End: 2022-09-19
  Administered 2022-09-19: 60 mg via INTRA_ARTICULAR

## 2022-09-19 MED ORDER — DICLOFENAC SODIUM 1 % EX GEL: 2.0000 g | Freq: Four times a day (QID) | CUTANEOUS | 1 refills | Status: DC | PRN

## 2022-09-19 NOTE — Progress Notes (Addendum)
Office Visit Note   Patient: Ashley Lindsey           Date of Birth: 1960-11-15           MRN: [id number] Visit Date: 09/19/2022              Requested by: [name], FNP [address] [place],  [place] [address] PCP: [name], FNP  Chief Complaint  Patient presents with  . Left Knee - Pain  . Right Knee - Pain      HPI: Alinda Money is a pleasant 62 year old woman with a history of bilateral osteoarthritis of her knees.  She has had steroid injections but they have not been long-lasting.  Most recent were in April.  She comes in today requesting an injection.  She denies any recent injury fever or chills.  She has been approved for Durolane injection into her left knee.  She would also like to get this approved for her right knee as she has failed conservative treatment on this knee as well  Assessment & Plan: Visit Diagnoses:  1. Unilateral primary osteoarthritis, right knee   2. Unilateral primary osteoarthritis, left knee     Plan: Will go forward with Durolane injection into her left knee without difficulty.  Will authorize for same on right knee and may follow-up with me once this is approved This patient is diagnosed with osteoarthritis of the knee(s).    Radiographs show evidence of joint space narrowing, osteophytes, subchondral sclerosis and/or subchondral cysts.  This patient has knee pain which interferes with functional and activities of daily living.    This patient has experienced inadequate response, adverse effects and/or intolerance with conservative treatments such as acetaminophen, NSAIDS, topical creams, physical therapy or regular exercise, knee bracing and/or weight loss.   This patient has experienced inadequate response or has a contraindication to intra articular steroid injections for at least 3 months.   This patient is not scheduled to have a total knee replacement within 6 months of starting treatment with viscosupplementation.    Follow-Up Instructions: For viscosupplementation on right knee when approved  Ortho Exam  Patient is alert, oriented, no adenopathy, well-dressed, normal affect, normal respiratory effort. No effusion of the left knee no erythema no redness compartments are soft and nontender she is neurovascular intact  Imaging: No results found. No images are attached to the encounter.  Labs: Lab Results  Component Value Date   HGBA1C 5.8 (H) 03/05/2022   HGBA1C 5.9 (H) 10/16/2021   HGBA1C 6.6 (H) 02/08/2021   ESRSEDRATE 48 (H) 08/26/2019   LABURIC 4.8 08/26/2019   REPTSTATUS 06/12/2017 FINAL 06/10/2017   CULT >=100,000 COLONIES/mL ESCHERICHIA COLI (A) 06/10/2017   LABORGA ESCHERICHIA COLI (A) 06/10/2017     Lab Results  Component Value Date   ALBUMIN 4.3 03/29/2022   ALBUMIN 4.2 03/05/2022   ALBUMIN 4.6 02/08/2021    Lab Results  Component Value Date   MG 1.9 01/16/2022   MG 2.0 02/17/2020   Lab Results  Component Value Date   VD25OH 41.6 01/16/2022   VD25OH 32 12/26/2011   VD25OH 55 06/23/2010    No results found for: "PREALBUMIN"    Latest Ref Rng & Units 07/05/2022   12:15 PM 04/12/2022    4:13 PM 03/05/2022   11:10 AM  CBC EXTENDED  WBC 4.0 - 10.5 K/uL 8.9  9.6  10.0   RBC 3.87 - 5.11 Mil/uL 4.81  4.83  4.26   Hemoglobin 12.0 - 15.0  g/dL 40.9  81.1  8.2   HCT 91.4 - 46.0 % 39.3  36.7  29.2   Platelets 150.0 - 400.0 K/uL 351.0  293.0  259   NEUT# 1.4 - 7.7 K/uL 5.4  6.2    Lymph# 0.7 - 4.0 K/uL 2.7  2.6       There is no height or weight on file to calculate BMI.  Orders:  No orders of the defined types were placed in this encounter.  Meds ordered this encounter  Medications  . diclofenac Sodium (VOLTAREN) 1 % GEL    Sig: Apply 2 g topically 4 (four) times daily as needed.    Dispense:  100 g    Refill:  1     Procedures: Large Joint Inj: L knee on 09/19/2022 11:30 AM Indications: pain and diagnostic evaluation Details: 1.5 in anteromedial  approach  Arthrogram: No  Medications: 60 mg Sodium Hyaluronate 60 MG/3ML Outcome: tolerated well, no immediate complications Procedure, treatment alternatives, risks and benefits explained, specific risks discussed. Consent was given by the patient.    Clinical Data: No additional findings.  ROS:  All other systems negative, except as noted in the HPI. Review of Systems  Objective: Vital Signs: There were no vitals taken for this visit.  Specialty Comments:  No specialty comments available.  PMFS History: Patient Active Problem List   Diagnosis Date Noted  . OSA (obstructive sleep apnea) 09/04/2022  . Claustrophobia 09/04/2022  . Suboptimal nutrition (HCC) 07/05/2022  . Cardiomyopathy, unspecified type (HCC) 07/05/2022  . Essential hypertension 07/05/2022  . Type 2 diabetes mellitus with diabetic dermatitis, without long-term current use of insulin (HCC) 07/05/2022  . Chronic gastric ulcer without hemorrhage and without perforation 04/30/2022  . Gastric AVM 04/30/2022  . Iron deficiency anemia 03/06/2022  . Unilateral primary osteoarthritis, left knee 10/02/2021  . Pulmonary nodules 07/12/2021  . Chronic cough 07/12/2021  . Avulsion fracture of middle phalanx of finger 04/18/2021  . Tibial plateau fracture, left 04/18/2021  . Asymmetrical sensorineural hearing loss 02/02/2021  . Bilateral primary osteoarthritis of knee 02/09/2020  . Chronic intermittent hypoxia with obstructive sleep apnea 08/24/2019  . Chronic intractable headache 07/14/2019  . Morning headache 07/14/2019  . Loud snoring 07/14/2019  . Super obesity 07/14/2019  . Sleeps in sitting position due to orthopnea 07/14/2019  . Chest pain due to GERD 07/14/2019  . Nocturia more than twice per night 07/14/2019  . Unilateral primary osteoarthritis, right knee 05/26/2019  . Chronic migraine without aura without status migrainosus, not intractable 05/13/2019  . Class 3 severe obesity due to excess calories  without serious comorbidity with body mass index (BMI) of 40.0 to 44.9 in adult (HCC) 08/05/2018  . Depression 08/05/2018  . Chronic nonintractable headache 08/05/2018  . Elevated blood-pressure reading without diagnosis of hypertension 06/06/2018  . Chronic pain of right ankle 04/24/2018  . Breast cancer of lower-outer quadrant of right female breast (HCC) 12/02/2014  . Bronchospasm 09/21/2014  . Dyspnea 09/21/2014  . Chronic pain of right knee 09/21/2014  . Fibromyalgia 03/06/2013  . Dense breasts 03/06/2013  . Mastalgia 03/06/2013  . Back pain, lumbosacral 03/06/2013  . Lymphedema of arm 03/06/2013  . Atypical chest pain 01/06/2013  . Breast cancer of upper-inner quadrant of left female breast (HCC) 09/08/2012  . Family history of malignant neoplasm of breast 09/08/2012  . S/P radiation therapy   . History of breast cancer in female 08/13/2011   Past Medical History:  Diagnosis Date  . Abdominal cramps   .  Anxiety   . Arthritis   . breast cancer    s/p radiation- last dose 6/12//has been off Tamoxifen 8/12  . Chronic headaches   . Costochondritis   . Fatigue   . Fibromyalgia 03/06/2013  . GERD (gastroesophageal reflux disease)   . History of COVID-19   . Hypertension   . Lymphedema    RIGHT ARM-////  STATES USE LEFT ARM FOR BP'S  . Migraines   . Muscle spasms of head and/or neck    following to the breast  . Passed out    4th of july  . Personal history of radiation therapy 2010   lt breast   . S/P radiation therapy 08/19/07 - 10/10/07   Left Breast/5040 cGy/28 fractions with Boost for a toatl dose of 6300 dGy  . S/P radiation therapy 08/14/10 -10/03/10   right breast  . Sleep apnea    STOP BANG SCORE 5    Family History  Problem Relation Age of Onset  . Breast cancer Mother 75  . Heart disease Father   . Diabetes Sister   . Prostate cancer Maternal Grandfather 96  . Cancer Maternal Aunt 63       d. from "female cancer" possibly cervical  . Breast cancer  Maternal Aunt 60  . Prostate cancer Maternal Uncle 78  . Prostate cancer Maternal Uncle 80  . Prostate cancer Paternal Uncle   . Prostate cancer Paternal Uncle   . Breast cancer Cousin 30       mat 1st cousin    Past Surgical History:  Procedure Laterality Date  . ABDOMINAL HYSTERECTOMY     with left salpingooophorectomy  . BIOPSY  04/30/2022   Procedure: BIOPSY;  Surgeon: Sherrilyn Rist, MD;  Location: [facility];  Service: Gastroenterology;;  . BREAST LUMPECTOMY  2009/2012   LEFT/RIGHT with lymph node dissection  . COLONOSCOPY WITH PROPOFOL N/A 04/30/2022   Procedure: COLONOSCOPY WITH PROPOFOL;  Surgeon: Sherrilyn Rist, MD;  Location: [facility];  Service: Gastroenterology;  Laterality: N/A;  . ESOPHAGOGASTRODUODENOSCOPY (EGD) WITH PROPOFOL N/A 04/30/2022   Procedure: ESOPHAGOGASTRODUODENOSCOPY (EGD) WITH PROPOFOL;  Surgeon: Sherrilyn Rist, MD;  Location: [facility];  Service: Gastroenterology;  Laterality: N/A;  . HOT HEMOSTASIS N/A 04/30/2022   Procedure: HOT HEMOSTASIS (ARGON PLASMA COAGULATION/BICAP);  Surgeon: Sherrilyn Rist, MD;  Location: [facility];  Service: Gastroenterology;  Laterality: N/A;  . KNEE ARTHROSCOPY     right  . OOPHORECTOMY  2013   Social History   Occupational History  . Occupation: disability  Tobacco Use  . Smoking status: Former    Packs/day: 0.25    Years: 1.00    Additional pack years: 0.00    Total pack years: 0.25    Types: Cigarettes  . Smokeless tobacco: Never  Vaping Use  . Vaping Use: Never used  Substance and Sexual Activity  . Alcohol use: Not Currently  . Drug use: No  . Sexual activity: Not Currently    Birth control/protection: Surgical

## 2022-09-19 NOTE — Telephone Encounter (Signed)
VOB submitted for Durolane, right knee.  

## 2022-09-20 ENCOUNTER — Telehealth: Payer: Self-pay

## 2022-09-20 NOTE — Telephone Encounter (Signed)
Patient called stating that she can't walk and that she is in a lot of pain from receiving the gel injection on 09/19/2022.  Would like a call back.  Cb# [phone].  Please advise.

## 2022-09-23 ENCOUNTER — Other Ambulatory Visit: Payer: Self-pay | Admitting: Cardiology

## 2022-09-23 ENCOUNTER — Other Ambulatory Visit: Payer: Self-pay | Admitting: Nurse Practitioner

## 2022-09-23 DIAGNOSIS — I429 Cardiomyopathy, unspecified: Secondary | ICD-10-CM

## 2022-09-25 ENCOUNTER — Other Ambulatory Visit: Payer: Self-pay | Admitting: Physical Medicine and Rehabilitation

## 2022-09-25 DIAGNOSIS — H9191 Unspecified hearing loss, right ear: Secondary | ICD-10-CM

## 2022-09-26 DIAGNOSIS — C50912 Malignant neoplasm of unspecified site of left female breast: Secondary | ICD-10-CM | POA: Diagnosis not present

## 2022-09-26 DIAGNOSIS — C50911 Malignant neoplasm of unspecified site of right female breast: Secondary | ICD-10-CM | POA: Diagnosis not present

## 2022-09-30 ENCOUNTER — Encounter: Payer: Self-pay | Admitting: Physical Medicine and Rehabilitation

## 2022-10-01 ENCOUNTER — Other Ambulatory Visit: Payer: Self-pay

## 2022-10-01 ENCOUNTER — Encounter: Payer: Self-pay | Admitting: *Deleted

## 2022-10-01 ENCOUNTER — Encounter: Payer: Self-pay | Admitting: Physical Medicine and Rehabilitation

## 2022-10-01 ENCOUNTER — Ambulatory Visit
Admission: EM | Admit: 2022-10-01 | Discharge: 2022-10-01 | Disposition: A | Discharge status: Home / Self Care | Payer: 59 | Attending: Internal Medicine | Admitting: Internal Medicine

## 2022-10-01 ENCOUNTER — Encounter: Payer: Self-pay | Admitting: Emergency Medicine

## 2022-10-01 ENCOUNTER — Ambulatory Visit (INDEPENDENT_AMBULATORY_CARE_PROVIDER_SITE_OTHER): Payer: 59

## 2022-10-01 DIAGNOSIS — M19071 Primary osteoarthritis, right ankle and foot: Secondary | ICD-10-CM | POA: Diagnosis not present

## 2022-10-01 DIAGNOSIS — M79671 Pain in right foot: Secondary | ICD-10-CM

## 2022-10-01 DIAGNOSIS — M25471 Effusion, right ankle: Secondary | ICD-10-CM

## 2022-10-01 DIAGNOSIS — M25571 Pain in right ankle and joints of right foot: Secondary | ICD-10-CM

## 2022-10-01 DIAGNOSIS — R2241 Localized swelling, mass and lump, right lower limb: Secondary | ICD-10-CM

## 2022-10-01 DIAGNOSIS — M7989 Other specified soft tissue disorders: Secondary | ICD-10-CM | POA: Diagnosis not present

## 2022-10-01 NOTE — ED Triage Notes (Signed)
Pt started with medial right foot/ankle pain after gel injection in left knee last week.  Swelling noted to medial foot.  Describes as burning. Swelling worse at end of day or when foot has not been elevated. Tried multiple OTC meds

## 2022-10-01 NOTE — ED Provider Notes (Signed)
[facility]    CSN: [id number] Arrival date & time: 10/01/22  1431      History   Chief Complaint Chief Complaint  Patient presents with   Foot Pain   Ankle Pain    HPI Ashley Lindsey is a 62 y.o. female.   Patient presents with right ankle and foot swelling and pain that started around 5/29.  Reports that it started after she got a left knee gel injection at orthopedist.  She denies any obvious injury to the area but reports that she thinks that she was favoring the right side given that she had significant pain in the left knee after the injection.  She has taken Tylenol and applied ice and heat with minimal improvement.  Denies any recent long distance travel in a car or plane.   Foot Pain  Ankle Pain   Past Medical History:  Diagnosis Date   Abdominal cramps    Anxiety    Arthritis    breast cancer    s/p radiation- last dose 6/12//has been off Tamoxifen 8/12   Chronic headaches    Costochondritis    Fatigue    Fibromyalgia 03/06/2013   GERD (gastroesophageal reflux disease)    History of COVID-19    Hypertension    Lymphedema    RIGHT ARM-////  STATES USE LEFT ARM FOR BP'S   Migraines    Muscle spasms of head and/or neck    following to the breast   Passed out    4th of july   Personal history of radiation therapy 2010   lt breast    S/P radiation therapy 08/19/07 - 10/10/07   Left Breast/5040 cGy/28 fractions with Boost for a toatl dose of 6300 dGy   S/P radiation therapy 08/14/10 -10/03/10   right breast   Sleep apnea    STOP BANG SCORE 5    Patient Active Problem List   Diagnosis Date Noted   OSA (obstructive sleep apnea) 09/04/2022   Claustrophobia 09/04/2022   Suboptimal nutrition (HCC) 07/05/2022   Cardiomyopathy, unspecified type (HCC) 07/05/2022   Essential hypertension 07/05/2022   Type 2 diabetes mellitus with diabetic dermatitis, without long-term current use of insulin (HCC) 07/05/2022   Chronic gastric ulcer without  hemorrhage and without perforation 04/30/2022   Gastric AVM 04/30/2022   Iron deficiency anemia 03/06/2022   Unilateral primary osteoarthritis, left knee 10/02/2021   Pulmonary nodules 07/12/2021   Chronic cough 07/12/2021   Avulsion fracture of middle phalanx of finger 04/18/2021   Tibial plateau fracture, left 04/18/2021   Asymmetrical sensorineural hearing loss 02/02/2021   Bilateral primary osteoarthritis of knee 02/09/2020   Chronic intermittent hypoxia with obstructive sleep apnea 08/24/2019   Chronic intractable headache 07/14/2019   Morning headache 07/14/2019   Loud snoring 07/14/2019   Super obesity 07/14/2019   Sleeps in sitting position due to orthopnea 07/14/2019   Chest pain due to GERD 07/14/2019   Nocturia more than twice per night 07/14/2019   Unilateral primary osteoarthritis, right knee 05/26/2019   Chronic migraine without aura without status migrainosus, not intractable 05/13/2019   Class 3 severe obesity due to excess calories without serious comorbidity with body mass index (BMI) of 40.0 to 44.9 in adult [facility]) 08/05/2018   Depression 08/05/2018   Chronic nonintractable headache 08/05/2018   Elevated blood-pressure reading without diagnosis of hypertension 06/06/2018   Chronic pain of right ankle 04/24/2018   Breast cancer of lower-outer quadrant of right female breast (HCC) 12/02/2014   Bronchospasm  09/21/2014   Dyspnea 09/21/2014   Chronic pain of right knee 09/21/2014   Fibromyalgia 03/06/2013   Dense breasts 03/06/2013   Mastalgia 03/06/2013   Back pain, lumbosacral 03/06/2013   Lymphedema of arm 03/06/2013   Atypical chest pain 01/06/2013   Breast cancer of upper-inner quadrant of left female breast (HCC) 09/08/2012   Family history of malignant neoplasm of breast 09/08/2012   S/P radiation therapy    History of breast cancer in female 08/13/2011    Past Surgical History:  Procedure Laterality Date   ABDOMINAL HYSTERECTOMY     with left  salpingooophorectomy   BIOPSY  04/30/2022   Procedure: BIOPSY;  Surgeon: Sherrilyn Rist, MD;  Location: [facility];  Service: Gastroenterology;;   BREAST LUMPECTOMY  2009/2012   LEFT/RIGHT with lymph node dissection   COLONOSCOPY WITH PROPOFOL N/A 04/30/2022   Procedure: COLONOSCOPY WITH PROPOFOL;  Surgeon: Sherrilyn Rist, MD;  Location: [facility];  Service: Gastroenterology;  Laterality: N/A;   ESOPHAGOGASTRODUODENOSCOPY (EGD) WITH PROPOFOL N/A 04/30/2022   Procedure: ESOPHAGOGASTRODUODENOSCOPY (EGD) WITH PROPOFOL;  Surgeon: Sherrilyn Rist, MD;  Location: [facility];  Service: Gastroenterology;  Laterality: N/A;   HOT HEMOSTASIS N/A 04/30/2022   Procedure: HOT HEMOSTASIS (ARGON PLASMA COAGULATION/BICAP);  Surgeon: Sherrilyn Rist, MD;  Location: [facility];  Service: Gastroenterology;  Laterality: N/A;   KNEE ARTHROSCOPY     right   OOPHORECTOMY  2013    OB History   No obstetric history on file.      Home Medications    Prior to Admission medications   Medication Sig Start Date End Date Taking? Authorizing Provider  acetaminophen (TYLENOL) 650 MG CR tablet Take 650 mg by mouth every 8 (eight) hours as needed for pain.    [provider]  atorvastatin (LIPITOR) 10 MG tablet TAKE 1 TABLET BY MOUTH EVERY DAY AT NIGHT 09/24/22   [name], FNP  Bisacodyl (DULCOLAX PO) Take 1 capsule by mouth daily as needed (constipation).     [provider]  cholecalciferol 5000 units TABS Take 1 tablet (5,000 Units total) by mouth 2 (two) times daily. Patient taking differently: Take 5,000 Units by mouth daily. 07/21/15   Serena Croissant, MD  cyclobenzaprine (FLEXERIL) 10 MG tablet Take 1 tablet (10 mg total) by mouth 3 (three) times daily. 07/05/22   [name], FNP  dexlansoprazole (DEXILANT) 60 MG capsule TAKE 1 CAPSULE BY MOUTH EVERY DAY 06/26/22   [name], FNP  diclofenac Sodium (VOLTAREN) 1 % GEL Apply 2 g topically 4 (four) times daily as needed. 09/19/22  10/19/22  Persons, [place], PA  diphenhydrAMINE (BENADRYL) 25 MG tablet Take 1 tablet (25 mg total) by mouth every 6 (six) hours as needed for up to 20 doses (migraine). Take with reglan for migraine headache 09/11/20   Terald Sleeper, MD  ELDERBERRY PO Take 1 tablet by mouth daily.    [provider]  ENTRESTO 24-26 MG TAKE 1 TABLET BY MOUTH TWICE A DAY 09/25/22   Tolia, Sunit, DO  ferrous sulfate 324 (65 Fe) MG TBEC Take 325 mg by mouth 2 (two) times daily.    [provider]  fish oil-omega-3 fatty acids 1000 MG capsule Take 1 g by mouth daily.    [provider]  Flaxseed, Linseed, (FLAXSEED OIL MAX STR PO) Take 1 capsule by mouth daily.     [provider]  gabapentin (NEURONTIN) 300 MG capsule TAKE 1 CAPSULE BY MOUTH THREE TIMES  A DAY 08/01/22   Raulkar, Drema Pry, MD  Magnesium 100 MG TABS Take 1 tablet by mouth daily. Takes 50 mg    [provider]  meloxicam (MOBIC) 7.5 MG tablet TAKE 1 TABLET BY MOUTH EVERY DAY 09/11/22   [name], FNP  Multiple Vitamins-Minerals (MULTIVITAMIN & MINERAL PO) Take 1 tablet by mouth daily.    [provider]  mupirocin ointment (BACTROBAN) 2 % Apply 1 Application topically 2 (two) times daily. Patient taking differently: Apply 1 Application topically as needed. 11/29/21   Gustavus Bryant, FNP  ondansetron (ZOFRAN) 4 MG tablet TAKE 1 TABLET BY MOUTH DAILY AS NEEDED FOR NAUSEA OR VOMITING 09/24/22   [name], FNP  OVER THE COUNTER MEDICATION Take 125 mg by mouth daily. [place]/Simethicone    [provider]  polyethylene glycol (MIRALAX / GLYCOLAX) 17 g packet Take 17 g by mouth daily as needed.    [provider]  propranolol (INDERAL) 10 MG tablet TAKE 1 TABLET BY MOUTH TWICE A DAY *HOLD IF SYSTOLIC BP (TOP #) IS <100MMHG OR PULSE IS <60BPM Patient taking differently: Take 10 mg by mouth at bedtime. 07/23/22   Tolia, Sunit, DO  Semaglutide (RYBELSUS) 3 MG TABS Take 1 tablet (3 mg  total) by mouth daily. 09/18/22   Raulkar, Drema Pry, MD  spironolactone (ALDACTONE) 25 MG tablet TAKE 1 TABLET BY MOUTH EVERY DAY IN THE MORNING 09/25/22   Tolia, Sunit, DO  SYMBICORT 80-4.5 MCG/ACT inhaler INHALE 2 PUFFS INTO THE LUNGS IN THE MORNING AND AT BEDTIME. 09/24/22   [name], FNP  topiramate (TOPAMAX) 50 MG tablet Take 1 tablet (50 mg total) by mouth at bedtime. 08/13/22   Raulkar, Drema Pry, MD  triamcinolone cream (KENALOG) 0.1 % APPLY 1 APPLICATION TOPICALLY 2 (TWO) TIMES DAILY AS NEEDED (RASH). 05/01/22   [name], FNP  Turmeric 500 MG CAPS Take by mouth.    [provider]  venlafaxine XR (EFFEXOR-XR) 150 MG 24 hr capsule TAKE 1 CAPSULE BY MOUTH NIGHTLY WITH 75MG  CAPSULE FOR A TOTAL OF 225MG  NIGHTLY 06/05/22   Serena Croissant, MD  venlafaxine XR (EFFEXOR-XR) 75 MG 24 hr capsule TAKE 1 CAPSULE BY MOUTH DAILY IN COMBINATION WITH XR 150 MG (TO EQUAL 225 MG TOTAL DAILY). 09/10/22   Serena Croissant, MD  Vitamin D, Ergocalciferol, (DRISDOL) 1.25 MG (50000 UNIT) CAPS capsule TAKE 1 CAPSULE BY MOUTH EVERY 7 DAYS 07/24/22   Raulkar, Drema Pry, MD  vitamin E 200 UNIT capsule Take 200 Units by mouth daily.    [provider]    Family History Family History  Problem Relation Age of Onset   Breast cancer Mother 69   Heart disease Father    Diabetes Sister    Prostate cancer Maternal Grandfather 106   Cancer Maternal Aunt 63       d. from "female cancer" possibly cervical   Breast cancer Maternal Aunt 60   Prostate cancer Maternal Uncle 78   Prostate cancer Maternal Uncle 80   Prostate cancer Paternal Uncle    Prostate cancer Paternal Uncle    Breast cancer Cousin 30       mat 1st cousin    Social History Social History   Tobacco Use   Smoking status: Former    Packs/day: 0.25    Years: 1.00    Additional pack years: 0.00    Total pack years: 0.25    Types: Cigarettes   Smokeless tobacco: Never  Vaping Use   Vaping  Use: Never used  Substance Use Topics    Alcohol use: Not Currently   Drug use: No     Allergies   Aspirin and Penicillins   Review of Systems Review of Systems Per HPI  Physical Exam Triage Vital Signs ED Triage Vitals [10/01/22 1611]  Enc Vitals Group     BP 127/84     Pulse Rate (!) 105     Resp 20     Temp 98.5 F (36.9 C)     Temp Source Oral     SpO2 95 %     Weight 280 lb (127 kg)     Height 5\' 2"  (1.575 m)     Head Circumference      Peak Flow      Pain Score 10     Pain Loc      Pain Edu?      Excl. in GC?    No data found.  Updated Vital Signs BP 127/84   Pulse (!) 105   Temp 98.5 F (36.9 C) (Oral)   Resp 20   Ht 5\' 2"  (1.575 m)   Wt 280 lb (127 kg)   SpO2 95%   BMI 51.21 kg/m   Visual Acuity Right Eye Distance:   Left Eye Distance:   Bilateral Distance:    Right Eye Near:   Left Eye Near:    Bilateral Near:     Physical Exam Constitutional:      General: She is not in acute distress.    Appearance: Normal appearance. She is not toxic-appearing.  HENT:     Head: Normocephalic and atraumatic.  Eyes:     Extraocular Movements: Extraocular movements intact.     Conjunctiva/sclera: Conjunctivae normal.  Pulmonary:     Effort: Pulmonary effort is normal.  Musculoskeletal:     Comments: Patient has tenderness to palpation to right medial ankle with swelling that is circumferential.  Swelling extends into the dorsal surface of the foot as well.  No discoloration or warmth noted.  No abrasions or lacerations.  Patient can wiggle toes.  Patient appears neurovascularly intact.  Neurological:     General: No focal deficit present.     Mental Status: She is alert and oriented to person, place, and time. Mental status is at baseline.  Psychiatric:        Mood and Affect: Mood normal.        Behavior: Behavior normal.        Thought Content: Thought content normal.        Judgment: Judgment normal.      UC Treatments / Results  Labs (all labs ordered are listed, but only  abnormal results are displayed) Labs Reviewed - No data to display  EKG   Radiology DG Foot Complete Right  Result Date: 10/01/2022 CLINICAL DATA:  Medial right foot and ankle pain after gel injection left knee last week. Medial foot swelling. EXAM: RIGHT ANKLE - COMPLETE 3+ VIEW; RIGHT FOOT COMPLETE - 3+ VIEW COMPARISON:  Right ankle radiographs 04/24/2018 FINDINGS: Right ankle: Mild distal medial and lateral malleolar and adjacent talar degenerative spurring. The ankle mortise is symmetric and intact. Moderate plantar calcaneal heel spur is unchanged from prior. Mild chronic enthesopathic change at the Achilles insertion on the calcaneus. Mild dorsal talonavicular degenerative osteophytosis. No acute fracture or dislocation. Right foot: Mild hallux valgus. Mild great toe metatarsophalangeal joint space narrowing, subchondral sclerosis/cystic change, and peripheral osteophytosis. Type II os naviculare. Mild to moderate dorsal tarsometatarsal  degenerative osteophytes on lateral view. No acute fracture or dislocation. IMPRESSION: 1. No acute fracture. 2. Mild hallux valgus and mild great toe metatarsophalangeal joint osteoarthritis. 3. Mild-to-moderate dorsal tarsometatarsal osteoarthritis. Electronically Signed   By: Neita Garnet M.D.   On: 10/01/2022 17:41   DG Ankle Complete Right  Result Date: 10/01/2022 CLINICAL DATA:  Medial right foot and ankle pain after gel injection left knee last week. Medial foot swelling. EXAM: RIGHT ANKLE - COMPLETE 3+ VIEW; RIGHT FOOT COMPLETE - 3+ VIEW COMPARISON:  Right ankle radiographs 04/24/2018 FINDINGS: Right ankle: Mild distal medial and lateral malleolar and adjacent talar degenerative spurring. The ankle mortise is symmetric and intact. Moderate plantar calcaneal heel spur is unchanged from prior. Mild chronic enthesopathic change at the Achilles insertion on the calcaneus. Mild dorsal talonavicular degenerative osteophytosis. No acute fracture or dislocation.  Right foot: Mild hallux valgus. Mild great toe metatarsophalangeal joint space narrowing, subchondral sclerosis/cystic change, and peripheral osteophytosis. Type II os naviculare. Mild to moderate dorsal tarsometatarsal degenerative osteophytes on lateral view. No acute fracture or dislocation. IMPRESSION: 1. No acute fracture. 2. Mild hallux valgus and mild great toe metatarsophalangeal joint osteoarthritis. 3. Mild-to-moderate dorsal tarsometatarsal osteoarthritis. Electronically Signed   By: Neita Garnet M.D.   On: 10/01/2022 17:41    Procedures Procedures (including critical care time)  Medications Ordered in UC Medications - No data to display  Initial Impression / Assessment and Plan / UC Course  I have reviewed the triage vital signs and the nursing notes.  Pertinent labs & imaging results that were available during my care of the patient were reviewed by me and considered in my medical decision making (see chart for details).     No obvious acute abnormalities on patient's x-rays but there does appear to be some degenerative changes.  This could be contributing to patient's pain versus acute injury.  Will treat with lace up ankle brace and advised ice application and elevation of extremity.  Patient already has meloxicam so encouraged her to take this as needed for pain.  She has established orthopedist so encouraged her to follow-up with them for further evaluation and management.  Discussed patient's clinical course with supervising physician Dr. Leonides Grills.  Patient verbalized understanding and was agreeable with plan. Final Clinical Impressions(s) / UC Diagnoses   Final diagnoses:  Right foot pain  Right ankle swelling  Acute right ankle pain  Localized swelling of right foot     Discharge Instructions      You have been given an ankle brace.  Do not sleep in this.  Elevate extremity and apply ice.  Follow-up with your orthopedist.     ED Prescriptions   None    PDMP  not reviewed this encounter.   Gustavus Bryant, [place] 10/01/22 [phone]

## 2022-10-01 NOTE — Discharge Instructions (Signed)
You have been given an ankle brace.  Do not sleep in this.  Elevate extremity and apply ice.  Follow-up with your orthopedist.

## 2022-10-01 NOTE — Progress Notes (Signed)
[facility] Quality Team Note  Name: Ashley Lindsey Date of Birth: 1960/11/29 MRN: [id number] Date: 10/01/2022  [facility] Quality Team has reviewed this patient's chart, please see recommendations below:  [facility] Quality Other; Pt has open gap for A1C.  Has ov 11/05/22.  Would provider be able to address at ov?

## 2022-10-02 ENCOUNTER — Encounter: Payer: 59 | Attending: Physical Medicine and Rehabilitation | Admitting: Physical Medicine and Rehabilitation

## 2022-10-02 VITALS — BP 148/80 | HR 106 | Ht 62.0 in | Wt 287.0 lb

## 2022-10-02 DIAGNOSIS — M25561 Pain in right knee: Secondary | ICD-10-CM | POA: Diagnosis not present

## 2022-10-02 DIAGNOSIS — F32A Depression, unspecified: Secondary | ICD-10-CM | POA: Diagnosis not present

## 2022-10-02 DIAGNOSIS — M797 Fibromyalgia: Secondary | ICD-10-CM | POA: Insufficient documentation

## 2022-10-02 DIAGNOSIS — G8929 Other chronic pain: Secondary | ICD-10-CM | POA: Insufficient documentation

## 2022-10-02 MED ORDER — GABAPENTIN 600 MG PO TABS: 600.0000 mg | ORAL_TABLET | Freq: Three times a day (TID) | ORAL | 3 refills | Status: DC

## 2022-10-02 NOTE — Progress Notes (Signed)
Subjective:    Patient ID: Ashley Lindsey, female    DOB: 15-Aug-1960, 62 y.o.   MRN: [id number]  HPI  Ashley Lindsey is a 62 year old woman who presents for f/u of lymphedema, lower extremity pain, and fibromyalgia  1) LUE lymphedema -garment was too expensive -when it flares it gives her no warning -she received a booklet and she does the therapy and sometimes it works and sometimes it doesn't -worse in the biceps -aches and wakes her up  2) Fibromyalgia -ran out of her gabapentin and she really felt this -working out in [employer] and [employer] -up and down with emotions -had to put her uncle in a nursing home and this circles her -she is cleaning out her uncle's room -she has had more flares recently.   3) Lower extremity pain -she sometimes feels that it radiates from her back -has not had any recent spinal imaging -she would be interested in increasing her dose of topamax  4) Obesity -she asks for alternatives to wegivy  5) Headaches: -taking tylenol but is wary of taking too much  6) Knee pain: -got viscosupplementation -has been in excruciating pain ever since  Pain Inventory Average Pain 9 Pain Right Now 9 My pain is constant, dull, and aching  In the last 24 hours, has pain interfered with the following? General activity 9 Relation with others 9 Enjoyment of life 9 What TIME of day is your pain at its worst? varies Sleep (in general) Fair  Pain is worse with: walking, sitting, inactivity, standing, and some activites Pain improves with: rest, heat/ice, therapy/exercise, pacing activities, medication, and injections Relief from Meds: 9  Family History  Problem Relation Age of Onset   Breast cancer Mother 73   Heart disease Father    Diabetes Sister    Prostate cancer Maternal Grandfather 108   Cancer Maternal Aunt 63       d. from "female cancer" possibly cervical   Breast cancer Maternal Aunt 60   Prostate cancer Maternal Uncle 78   Prostate  cancer Maternal Uncle 80   Prostate cancer Paternal Uncle    Prostate cancer Paternal Uncle    Breast cancer Cousin 30       mat 1st cousin   Social History   Socioeconomic History   Marital status: Divorced    Spouse name: Not on file   Number of children: 1   Years of education: Not on file   Highest education level: Not on file  Occupational History   Occupation: disability  Tobacco Use   Smoking status: Former    Packs/day: 0.25    Years: 1.00    Additional pack years: 0.00    Total pack years: 0.25    Types: Cigarettes   Smokeless tobacco: Never  Vaping Use   Vaping Use: Never used  Substance and Sexual Activity   Alcohol use: Not Currently   Drug use: No   Sexual activity: Not Currently    Birth control/protection: Surgical  Other Topics Concern   Not on file  Social History Narrative   Right handed    Coffee daily, sometimes Ginger ale    Social Determinants of Health   Financial Resource Strain: Low Risk  (02/08/2022)   Overall Financial Resource Strain (CARDIA)    Difficulty of Paying Living Expenses: Not hard at all  Food Insecurity: No Food Insecurity (02/08/2022)   Hunger Vital Sign    Worried About Running Out of Food in the  Last Year: Never true    Ran Out of Food in the Last Year: Never true  Transportation Needs: No Transportation Needs (02/08/2022)   PRAPARE - Administrator, Civil Service (Medical): No    Lack of Transportation (Non-Medical): No  Physical Activity: Inactive (02/08/2022)   Exercise Vital Sign    Days of Exercise per Week: 0 days    Minutes of Exercise per Session: 0 min  Stress: No Stress Concern Present (02/08/2022)   [name] of Occupational Health - Occupational Stress Questionnaire    Feeling of Stress : Not at all  Social Connections: Not on file   Past Surgical History:  Procedure Laterality Date   ABDOMINAL HYSTERECTOMY     with left salpingooophorectomy   BIOPSY  04/30/2022   Procedure: BIOPSY;   Surgeon: Sherrilyn Rist, MD;  Location: [facility];  Service: Gastroenterology;;   BREAST LUMPECTOMY  2009/2012   LEFT/RIGHT with lymph node dissection   COLONOSCOPY WITH PROPOFOL N/A 04/30/2022   Procedure: COLONOSCOPY WITH PROPOFOL;  Surgeon: Sherrilyn Rist, MD;  Location: [facility];  Service: Gastroenterology;  Laterality: N/A;   ESOPHAGOGASTRODUODENOSCOPY (EGD) WITH PROPOFOL N/A 04/30/2022   Procedure: ESOPHAGOGASTRODUODENOSCOPY (EGD) WITH PROPOFOL;  Surgeon: Sherrilyn Rist, MD;  Location: [facility];  Service: Gastroenterology;  Laterality: N/A;   HOT HEMOSTASIS N/A 04/30/2022   Procedure: HOT HEMOSTASIS (ARGON PLASMA COAGULATION/BICAP);  Surgeon: Sherrilyn Rist, MD;  Location: [facility];  Service: Gastroenterology;  Laterality: N/A;   KNEE ARTHROSCOPY     right   OOPHORECTOMY  2013   Past Surgical History:  Procedure Laterality Date   ABDOMINAL HYSTERECTOMY     with left salpingooophorectomy   BIOPSY  04/30/2022   Procedure: BIOPSY;  Surgeon: Sherrilyn Rist, MD;  Location: [facility];  Service: Gastroenterology;;   BREAST LUMPECTOMY  2009/2012   LEFT/RIGHT with lymph node dissection   COLONOSCOPY WITH PROPOFOL N/A 04/30/2022   Procedure: COLONOSCOPY WITH PROPOFOL;  Surgeon: Sherrilyn Rist, MD;  Location: [facility];  Service: Gastroenterology;  Laterality: N/A;   ESOPHAGOGASTRODUODENOSCOPY (EGD) WITH PROPOFOL N/A 04/30/2022   Procedure: ESOPHAGOGASTRODUODENOSCOPY (EGD) WITH PROPOFOL;  Surgeon: Sherrilyn Rist, MD;  Location: [facility];  Service: Gastroenterology;  Laterality: N/A;   HOT HEMOSTASIS N/A 04/30/2022   Procedure: HOT HEMOSTASIS (ARGON PLASMA COAGULATION/BICAP);  Surgeon: Sherrilyn Rist, MD;  Location: [facility];  Service: Gastroenterology;  Laterality: N/A;   KNEE ARTHROSCOPY     right   OOPHORECTOMY  2013   Past Medical History:  Diagnosis Date   Abdominal cramps    Anxiety    Arthritis    breast cancer    s/p radiation-  last dose 6/12//has been off Tamoxifen 8/12   Chronic headaches    Costochondritis    Fatigue    Fibromyalgia 03/06/2013   GERD (gastroesophageal reflux disease)    History of COVID-19    Hypertension    Lymphedema    RIGHT ARM-////  STATES USE LEFT ARM FOR BP'S   Migraines    Muscle spasms of head and/or neck    following to the breast   Passed out    4th of july   Personal history of radiation therapy 2010   lt breast    S/P radiation therapy 08/19/07 - 10/10/07   Left Breast/5040 cGy/28 fractions with Boost for a toatl dose of 6300 dGy   S/P radiation therapy 08/14/10 -10/03/10   right breast  Sleep apnea    STOP BANG SCORE 5   There were no vitals taken for this visit.  Opioid Risk Score:   Fall Risk Score:  `1  Depression screen [facility] 2/9     02/08/2022   10:11 AM 01/16/2022    9:14 AM 12/19/2021   10:39 AM 07/06/2021   10:17 AM 02/02/2021    9:45 AM 01/11/2021    2:05 PM 12/02/2019    2:55 PM  Depression screen PHQ 2/9  Decreased Interest 0 0 0 1 1 0 1  Down, Depressed, Hopeless 0 0 0 1 1 0 1  PHQ - 2 Score 0 0 0 2 2 0 2  Altered sleeping       1  Tired, decreased energy       3  Change in appetite       1  Feeling bad or failure about yourself        0  Trouble concentrating       3  Moving slowly or fidgety/restless       0  Suicidal thoughts       0  PHQ-9 Score       10  Difficult doing work/chores       Somewhat difficult     Review of Systems  Musculoskeletal:  Positive for neck pain.       B/L hips knee finger tip pain RT shoulder/arm  All other systems reviewed and are negative.      Objective:   Physical Exam Gen: no distress, normal appearing HEENT: oral mucosa pink and moist, NCAT Cardio: Reg rate Chest: normal effort, normal rate of breathing Abd: soft, non-distended Ext: no edema Psych: pleasant, normal affect Skin: intact Neuro: Alert and oriented x3 MSK; multiple trigger points     Assessment & Plan:   1) LUE lymphedema -will  give script for garment -commended her on doing her lymphedema exercises -continue tiger balm  2) Fibromyalgia -increase gabapentin to 600mg  TID, provided refill -discussed her family stressors -continue tylenol -continue gabapentin, discussed can double of dose for severe flares.  -Discussed current symptoms of pain and history of pain.  -Discussed benefits of exercise in reducing pain. -Discussed following foods that may reduce pain: 1) Ginger (especially studied for arthritis)- reduce leukotriene production to decrease inflammation 2) Blueberries- high in phytonutrients that decrease inflammation 3) Salmon- marine omega-3s reduce joint swelling and pain 4) Pumpkin seeds- reduce inflammation 5) dark chocolate- reduces inflammation 6) turmeric- reduces inflammation 7) tart cherries - reduce pain and stiffness 8) extra virgin olive oil - its compound olecanthal helps to block prostaglandins  9) chili peppers- can be eaten or applied topically via capsaicin 10) mint- helpful for headache, muscle aches, joint pain, and itching 11) garlic- reduces inflammation  Link to further information on diet for chronic pain: [email or web address].[email or web address]   3) Right 5th digit DIP OA: -XR ordered  4) Bilateral knee pain -RF and ANA ordered.   5) Obesity: -prescribed oral semglutide for weight loss -Educated that current weight is 274 lbs and current BMI is 50.12 Discussed following criteria for Wegovy: 1) Patient has diagnosis of obesity; 2) Patient must be 64 years of age or older; 3) The patient has been involved in a physician or dietitian monitored weight loss program, consisting of both low-calorie diet, increased physical activity and behavioral counseling for a minimum of 6 months without a 3% loss from baseline; 4) Patients BMI is one of the following: a) 30kg/m or greater; b)  27 29.99 kg/m in the presence of at least one weight  related comorbidity such as coronary heart disease, dyslipidemia, hypertension, symptomatic arthritis of the lower extremities, type 2 diabetes mellitus, obstructive sleep apnea; c) 25-29.9 kg/m2 and waist circumference is &gt; 40 inches for males or &gt; 35 inches for females;5) Not Met - Must have tried and failed at least one preferred oral agent such as Phentermine; 6) Patient has no labeled contraindication; 7) Patient is not taking another weight loss medication; 8) The dose is within approved product labeling guidelines; 10) The patient must not be taking another GLP-1 receptor agonist AND the patient must not be taking insulin concurrently.  -prescribed ZOXWRU but discussed that this was denied -increase topamax HS to 50mg  -referred to nutrition -recommended resistance training and prioritizing protein intake to avoid muscle loss while taking this medication -discussed risks of thyroid cancer and gastroparesis  -Educated regarding health benefits of weight loss- for pain, general health, chronic disease prevention, immune health, mental health.  -Will monitor weight every visit.  -Consider Roobois tea daily.  -Discussed the benefits of intermittent fasting. -Discussed foods that can assist in weight loss: 1) leafy greens- high in fiber and nutrients 2) dark chocolate- improves metabolism (if prefer sweetened, best to sweeten with honey instead of sugar).  3) cruciferous vegetables- high in fiber and protein 4) full fat yogurt: high in healthy fat, protein, calcium, and probiotics 5) apples- high in a variety of phytochemicals 6) nuts- high in fiber and protein that increase feelings of fullness 7) grapefruit: rich in nutrients, antioxidants, and fiber (not to be taken with anticoagulation) 8) beans- high in protein and fiber 9) salmon- has high quality protein and healthy fats 10) green tea- rich in polyphenols 11) eggs- rich in choline and vitamin D 12) tuna- high protein, boosts  metabolism 13) avocado- decreases visceral abdominal fat 14) chicken (pasture raised): high in protein and iron 15) blueberries- reduce abdominal fat and cholesterol 16) whole grains- decreases calories retained during digestion, speeds metabolism 17) chia seeds- curb appetite 18) chilies- increases fat metabolism  -Discussed supplements that can be used:  1) Metatrim 400mg  BID 30 minutes before breakfast and dinner  2) Sphaeranthus indicus and Garcinia mangostana (combinations of these and #1 can be found in capsicum and zychrome  3) green coffee bean extract 400mg  twice per day or Irvingia (african mango) 150 to 300mg  twice per day.  6) Iron deficiency anemia: -discussed that when she supplemented she felt improved energy.   7) Radicular lumbar pain: -lumbar XR ordered -increase topamax HS to 50mg   8) Headaches: -discussed that topamax is usually beneficial for headaches -discussed home sleep apnea testing  9) depression: -discussed that it embarrassed her to have to give up her job -discussed that this continues -discussed that she has to make time for herself -discussed that her mom needs time from her and she may have mild cognitive impairment -discussed that on Sunday she didn't even come out of her room -discussed ketogenic diet  10) Knee pain: -discussed increased pain after viscosupplementation -discussed ketogenic diet -tried to get [facility] approved but it was denied

## 2022-10-04 ENCOUNTER — Ambulatory Visit: Payer: 59 | Admitting: Audiology

## 2022-10-05 ENCOUNTER — Encounter: Payer: Self-pay | Admitting: Cardiology

## 2022-10-08 ENCOUNTER — Other Ambulatory Visit: Payer: Self-pay | Admitting: Nurse Practitioner

## 2022-10-08 ENCOUNTER — Ambulatory Visit (INDEPENDENT_AMBULATORY_CARE_PROVIDER_SITE_OTHER): Payer: 59 | Admitting: Physician Assistant

## 2022-10-08 DIAGNOSIS — M76829 Posterior tibial tendinitis, unspecified leg: Secondary | ICD-10-CM | POA: Insufficient documentation

## 2022-10-08 DIAGNOSIS — M76821 Posterior tibial tendinitis, right leg: Secondary | ICD-10-CM | POA: Diagnosis not present

## 2022-10-08 MED ORDER — DICLOFENAC SODIUM 1 % EX GEL: 2.0000 g | Freq: Four times a day (QID) | CUTANEOUS | 1 refills | Status: AC | PRN

## 2022-10-08 NOTE — Telephone Encounter (Signed)
From pt

## 2022-10-08 NOTE — Progress Notes (Signed)
Office Visit Note   Patient: Ashley Lindsey           Date of Birth: 08/31/1960           MRN: [id number] Visit Date: 10/08/2022              Requested by: [name], FNP [address] [place],  [place] [address] PCP: [name], FNP   Assessment & Plan: Visit Diagnoses:  1. Posterior tibial tendinitis of right lower extremity     Plan: Patient is a 62 year old woman who have treated in the past for arthritis of her bilateral knees.  Several weeks ago she underwent a Durolane injection of her left knee.  She did have quite a bit of pain after the injection and she thinks from limping she began to notice swelling and pain in her right foot.  Does not have a history of this prior to that.  She also has noticed that her foot seems to be collapsing in on the right.  No other injury.  She did present to the urgent care because of this pain several days ago.  She was given a brace but cannot tolerate it.  Exam today does not show any infective process.  She does have swelling and pain isolated to the medial side of her ankle.  She is tender acutely along the posterior tibial tendon and does have some planovalgus collapse.  No lateral impingement findings.  I do think she has findings consistent with posterior tibial tendon dysfunction versus tendinitis.  We discussed possibility of doing an MRI but since this is subacute we are going to try some conservative treatment consisting of an arch support inside a short cam walker boot.  She will follow-up with Dr. Lajoyce Corners in a few weeks to see how she is doing certainly if this was still a problem could consider an MRI  Follow-Up Instructions: Return in about 3 weeks (around 10/29/2022).   Orders:  No orders of the defined types were placed in this encounter.  Meds ordered this encounter  Medications   diclofenac Sodium (VOLTAREN) 1 % GEL    Sig: Apply 2 g topically 4 (four) times daily as needed.    Dispense:  100 g    Refill:  1       Procedures: No procedures performed   Clinical Data: No additional findings.   Subjective: Chief Complaint  Patient presents with   Right Ankle - Pain    HPI patient is a pleasant 62 year old woman who presents with a chief complaint of right medial ankle pain.  Denies any injury.  She said this began when her gait was awkward after receiving a Durolane injection into the left knee after which she had quite a bit of pain.  She now has some relief from the injection but her right foot is her biggest problem.  She did go to an urgent care and x-rays were done other than showing some arthritis no acute fractures were noted  Review of Systems  All other systems reviewed and are negative.   Objective: Vital Signs: There were no vitals taken for this visit.  Physical Exam Constitutional:      Appearance: Normal appearance.  Pulmonary:     Effort: Pulmonary effort is normal.  Skin:    General: Skin is warm and dry.  Neurological:     General: No focal deficit present.     Mental Status: She is alert and oriented to person, place,  and time.   Ortho Exam Examination of her right foot she does have some mild to moderate soft tissue swelling focused on the medial side of her ankle.  No erythema no cellulitis her foot is warm and pulses are palpable.  She has good plantarflexion and eversion.  She has very poor inversion and hurts when she tries to do this.  She is tender along the posterior tibial tendon compartments are soft and nontender Specialty Comments:  No specialty comments available.  Imaging: No results found.   PMFS History: Patient Active Problem List   Diagnosis Date Noted   Tibialis posterior tendinitis 10/08/2022   OSA (obstructive sleep apnea) 09/04/2022   Claustrophobia 09/04/2022   Suboptimal nutrition (HCC) 07/05/2022   Cardiomyopathy, unspecified type (HCC) 07/05/2022   Essential hypertension 07/05/2022   Type 2 diabetes mellitus with diabetic  dermatitis, without long-term current use of insulin (HCC) 07/05/2022   Chronic gastric ulcer without hemorrhage and without perforation 04/30/2022   Gastric AVM 04/30/2022   Iron deficiency anemia 03/06/2022   Unilateral primary osteoarthritis, left knee 10/02/2021   Pulmonary nodules 07/12/2021   Chronic cough 07/12/2021   Avulsion fracture of middle phalanx of finger 04/18/2021   Tibial plateau fracture, left 04/18/2021   Asymmetrical sensorineural hearing loss 02/02/2021   Bilateral primary osteoarthritis of knee 02/09/2020   Chronic intermittent hypoxia with obstructive sleep apnea 08/24/2019   Chronic intractable headache 07/14/2019   Morning headache 07/14/2019   Loud snoring 07/14/2019   Super obesity 07/14/2019   Sleeps in sitting position due to orthopnea 07/14/2019   Chest pain due to GERD 07/14/2019   Nocturia more than twice per night 07/14/2019   Unilateral primary osteoarthritis, right knee 05/26/2019   Chronic migraine without aura without status migrainosus, not intractable 05/13/2019   Class 3 severe obesity due to excess calories without serious comorbidity with body mass index (BMI) of 40.0 to 44.9 in adult (HCC) 08/05/2018   Depression 08/05/2018   Chronic nonintractable headache 08/05/2018   Elevated blood-pressure reading without diagnosis of hypertension 06/06/2018   Chronic pain of right ankle 04/24/2018   Breast cancer of lower-outer quadrant of right female breast (HCC) 12/02/2014   Bronchospasm 09/21/2014   Dyspnea 09/21/2014   Chronic pain of right knee 09/21/2014   Fibromyalgia 03/06/2013   Dense breasts 03/06/2013   Mastalgia 03/06/2013   Back pain, lumbosacral 03/06/2013   Lymphedema of arm 03/06/2013   Atypical chest pain 01/06/2013   Breast cancer of upper-inner quadrant of left female breast (HCC) 09/08/2012   Family history of malignant neoplasm of breast 09/08/2012   S/P radiation therapy    History of breast cancer in female 08/13/2011    Past Medical History:  Diagnosis Date   Abdominal cramps    Anxiety    Arthritis    breast cancer    s/p radiation- last dose 6/12//has been off Tamoxifen 8/12   Chronic headaches    Costochondritis    Fatigue    Fibromyalgia 03/06/2013   GERD (gastroesophageal reflux disease)    History of COVID-19    Hypertension    Lymphedema    RIGHT ARM-////  STATES USE LEFT ARM FOR BP'S   Migraines    Muscle spasms of head and/or neck    following to the breast   Passed out    4th of july   Personal history of radiation therapy 2010   lt breast    S/P radiation therapy 08/19/07 - 10/10/07   Left Breast/5040 cGy/28 fractions  with Boost for a toatl dose of 6300 dGy   S/P radiation therapy 08/14/10 -10/03/10   right breast   Sleep apnea    STOP BANG SCORE 5    Family History  Problem Relation Age of Onset   Breast cancer Mother 81   Heart disease Father    Diabetes Sister    Prostate cancer Maternal Grandfather 74   Cancer Maternal Aunt 63       d. from "female cancer" possibly cervical   Breast cancer Maternal Aunt 60   Prostate cancer Maternal Uncle 78   Prostate cancer Maternal Uncle 80   Prostate cancer Paternal Uncle    Prostate cancer Paternal Uncle    Breast cancer Cousin 30       mat 1st cousin    Past Surgical History:  Procedure Laterality Date   ABDOMINAL HYSTERECTOMY     with left salpingooophorectomy   BIOPSY  04/30/2022   Procedure: BIOPSY;  Surgeon: Sherrilyn Rist, MD;  Location: [facility];  Service: Gastroenterology;;   BREAST LUMPECTOMY  2009/2012   LEFT/RIGHT with lymph node dissection   COLONOSCOPY WITH PROPOFOL N/A 04/30/2022   Procedure: COLONOSCOPY WITH PROPOFOL;  Surgeon: Sherrilyn Rist, MD;  Location: [facility];  Service: Gastroenterology;  Laterality: N/A;   ESOPHAGOGASTRODUODENOSCOPY (EGD) WITH PROPOFOL N/A 04/30/2022   Procedure: ESOPHAGOGASTRODUODENOSCOPY (EGD) WITH PROPOFOL;  Surgeon: Sherrilyn Rist, MD;  Location: [facility];   Service: Gastroenterology;  Laterality: N/A;   HOT HEMOSTASIS N/A 04/30/2022   Procedure: HOT HEMOSTASIS (ARGON PLASMA COAGULATION/BICAP);  Surgeon: Sherrilyn Rist, MD;  Location: [facility];  Service: Gastroenterology;  Laterality: N/A;   KNEE ARTHROSCOPY     right   OOPHORECTOMY  2013   Social History   Occupational History   Occupation: disability  Tobacco Use   Smoking status: Former    Packs/day: 0.25    Years: 1.00    Additional pack years: 0.00    Total pack years: 0.25    Types: Cigarettes   Smokeless tobacco: Never  Vaping Use   Vaping Use: Never used  Substance and Sexual Activity   Alcohol use: Not Currently   Drug use: No   Sexual activity: Not Currently    Birth control/protection: Surgical

## 2022-10-10 ENCOUNTER — Ambulatory Visit (INDEPENDENT_AMBULATORY_CARE_PROVIDER_SITE_OTHER): Payer: 59 | Admitting: Neurology

## 2022-10-10 DIAGNOSIS — R519 Headache, unspecified: Secondary | ICD-10-CM

## 2022-10-10 DIAGNOSIS — G4733 Obstructive sleep apnea (adult) (pediatric): Secondary | ICD-10-CM

## 2022-10-10 DIAGNOSIS — E669 Obesity, unspecified: Secondary | ICD-10-CM

## 2022-10-10 DIAGNOSIS — F40240 Claustrophobia: Secondary | ICD-10-CM

## 2022-10-10 DIAGNOSIS — G43709 Chronic migraine without aura, not intractable, without status migrainosus: Secondary | ICD-10-CM

## 2022-10-10 DIAGNOSIS — R0601 Orthopnea: Secondary | ICD-10-CM

## 2022-10-10 NOTE — Telephone Encounter (Signed)
I started her on Wegovy at the last office visit.  And asked her to stop Topamax for weight loss.  Please see if she had her Wegovy filled and if she is taking it.  After she has been on it for 4 weeks we will uptitrate the dose.  Briggs Edelen Norway, DO, [facility]

## 2022-10-11 NOTE — Telephone Encounter (Signed)
From pt

## 2022-10-11 NOTE — Progress Notes (Signed)
[facility]   HOME SLEEP TEST REPORT ( by Watch PAT)   STUDY DATE:  10-11-2022 Ashley Lindsey 62 year old female January 02, 1961   ORDERING CLINICIAN: Melvyn Novas, MD  REFERRING CLINICIAN: Dr Ashley Dean, MD  Ashley Galas, NP and Ashley Ashley Maple, DO in cardiology   CLINICAL INFORMATION/HISTORY: Chronic intractable headaches, cardiomyopathy, morbid obesity, Orthopnea.   09-04-2022 referral by Ashley Lindsey, 09-04-2022: Chief concern according to patient :  " my headaches have worsened, and I need to consider CPAP now -BTW: " I never got my BiPAP "- see note from NP Ashley Lindsey below. The patient didn't pick up her prescribed machine and never corresponded with DME about her mask preferences, etc. This has been a pattern since 2013 when she was followed by Ashley Ashley Lindsey.    She has taken Excedrin in excess over years, and was taken off by GI after a Colonoscopy confirmed in march 2024 as she has ulcers, lost blood, became anemic.    She is awaiting TK replacement on the left nee, but is too heavy to get surgery.   She has meanwhile gained weight, and while she was recently on vacation  her daughter had mentioned how very loud she snores. Nocturia times  3-5 .   CD : In order to work her up again, I will have to order a home sleep test . This time this will be a WatchPAT device.  My goal is to place her on an auto titration CPAP machine and I do not care how well the mask will prevent air leaks I want her to start in the name of comfort with some interface  of her comfort. Formerly a patient of Ashley Lindsey - She underwent a baseline polysomnography on 22 November 2011 and she was diagnosed with obstructive sleep apnea.  So on the contrary to her recall, she actually had an AHI of 16.1 and RDI of 18.7/h= significant for mild sleep apnea.  There were a lot of spontaneous arousals at night.  Two thirds of her events were hypopneas and only one third in apneas, yet  all of them were  obstructive in nature. The longest hypopnea duration was 53.8 seconds. CPAP was prescribed but she claimed to have never received a diagnosis nor the device.      Epworth sleepiness score: 15 /24. FSS at 46/ 63 points. GDS at 5/ 15 points.    BMI: 53 kg/m   Neck Circumference: 16.5   FINDINGS/ Summary:   Total Recording Time (hours, min): 3 hours 28 minutes (!) .  The device no longer recorded after 1 AM.   Total Sleep Time (hours, min):   2 hours 47 minutes              Percent REM (%):    9.5%                                    Respiratory Indices by AASM and by CMS :   Calculated pAHI (per hour):  39.7/h , severe apnea.    (CMS scoring criteria lead to an AHI of 24.4/h)                     REM pAHI and NREM pAHI cannot be differentiated when the overall REM sleep proportion is less than 10%  Positional AHI: 164 minutes were recorded in supine sleep position and only 3.5 minutes on the left side.   This leaves the overall AASM scoring AHI of just under 40/h and the CMS criteria with an AHI of 26.4/h.                                                 Snoring was present for 31% of the limited recording time.  Mean volume was 41 dB. Oxygen Saturation Statistics:        O2 Saturation Range (%):      Between the nadir at 84% of the maximum saturation of 96% with a mean saturation of 91%.                                 O2 Saturation (minutes) <89%: 5.1 minutes         Pulse Rate Statistics:   Pulse Mean (bpm):    90 bpm             Pulse Range: Between 80 to 106 bpm, clearly an elevated heart rate.                IMPRESSION:  This HST is of limited validity as the recorded sleep time is below 4 hours however it is very likely that it may not have captured the worst of the night given that there was not much REM sleep recorded and that a 4-hour or more recording which just have shown more of the same or worse.  This is severe sleep apnea even by CMS  criteria this is still moderate severe sleep apnea.    RECOMMENDATION: This patient needs to start positive airway pressure therapy and will be ordered a CPAP between 7 and 20 cm of water pressure with 3 cm expiratory pressure relief, humidifier and interface of her choice and comfort.  She should be advised that any problems with the CPAP operation or the mask fit have to be addressed with the durable medical equipment company immediately so that she does not become noncompliant. Noncompliance with CPAP therapy will lead to an insurance recall of the machine and the patient would not be able to get supplies. Follow-up with nurse practitioner Ashley Lindsey will be taking place between the 60th and 90th day of CPAP use. Compliance consists of daily use of CPAP for a minimum of 4 hours each night.     INTERPRETING PHYSICIAN:   Melvyn Novas, MD   [facility].

## 2022-10-11 NOTE — Telephone Encounter (Signed)
I was trying to get her on [facility].  Looks like she is on Rybelsus which is similar to oral formulation.  It is being prescribed by different provider please have her follow-up with that provider for up titration of medical therapy.  Mickayla Trouten [place], DO, [facility]

## 2022-10-13 NOTE — Procedures (Signed)
    [facility]   HOME SLEEP TEST REPORT ( by Watch PAT)   STUDY DATE:  10-11-2022 Ashley Lindsey 62 year old female 12/22/1960   ORDERING CLINICIAN: Namiyah Grantham, MD  REFERRING CLINICIAN: Dr Antonia Ahern, MD  Janice Moore, NP and Dr Tolia Sunti, DO in cardiology   CLINICAL INFORMATION/HISTORY: Chronic intractable headaches, cardiomyopathy, morbid obesity, Orthopnea.   09-04-2022 referral by Dr Ahern, 09-04-2022: Chief concern according to patient :  " my headaches have worsened, and I need to consider CPAP now -BTW: " I never got my BiPAP "- see note from NP [place] below. The patient didn't pick up her prescribed machine and never corresponded with DME about her mask preferences, etc. This has been a pattern since 2013 when she was followed by Dr Young.    She has taken Excedrin in excess over years, and was taken off by GI after a Colonoscopy confirmed in march 2024 as she has ulcers, lost blood, became anemic.    She is awaiting TK replacement on the left nee, but is too heavy to get surgery.   She has meanwhile gained weight, and while she was recently on vacation  her daughter had mentioned how very loud she snores. Nocturia times  3-5 .   CD : In order to work her up again, I will have to order a home sleep test . This time this will be a WatchPAT device.  My goal is to place her on an auto titration CPAP machine and I do not care how well the mask will prevent air leaks I want her to start in the name of comfort with some interface  of her comfort. Formerly a patient of dr Clinton Young - She underwent a baseline polysomnography on 22 November 2011 and she was diagnosed with obstructive sleep apnea.  So on the contrary to her recall, she actually had an AHI of 16.1 and RDI of 18.7/h= significant for mild sleep apnea.  There were a lot of spontaneous arousals at night.  Two thirds of her events were hypopneas and only one third in apneas, yet  all of them were  obstructive in nature. The longest hypopnea duration was 53.8 seconds. CPAP was prescribed but she claimed to have never received a diagnosis nor the device.      Epworth sleepiness score: 15 /24. FSS at 46/ 63 points. GDS at 5/ 15 points.    BMI: 53 kg/m   Neck Circumference: 16.5   FINDINGS/ Summary:   Total Recording Time (hours, min): 3 hours 28 minutes (!) .  The device no longer recorded after 1 AM.   Total Sleep Time (hours, min):   2 hours 47 minutes              Percent REM (%):    9.5%                                    Respiratory Indices by AASM and by CMS :   Calculated pAHI (per hour):  39.7/h , severe apnea.    (CMS scoring criteria lead to an AHI of 24.4/h)                     REM pAHI and NREM pAHI cannot be differentiated when the overall REM sleep proportion is less than 10%                               Positional AHI: 164 minutes were recorded in supine sleep position and only 3.5 minutes on the left side.   This leaves the overall AASM scoring AHI of just under 40/h and the CMS criteria with an AHI of 26.4/h.                                                 Snoring was present for 31% of the limited recording time.  Mean volume was 41 dB. Oxygen Saturation Statistics:        O2 Saturation Range (%):      Between the nadir at 84% of the maximum saturation of 96% with a mean saturation of 91%.                                 O2 Saturation (minutes) <89%: 5.1 minutes         Pulse Rate Statistics:   Pulse Mean (bpm):    90 bpm             Pulse Range: Between 80 to 106 bpm, clearly an elevated heart rate.                IMPRESSION:  This HST is of limited validity as the recorded sleep time is below 4 hours however it is very likely that it may not have captured the worst of the night given that there was not much REM sleep recorded and that a 4-hour or more recording which just have shown more of the same or worse.  This is severe sleep apnea even by CMS  criteria this is still moderate severe sleep apnea.    RECOMMENDATION: This patient needs to start positive airway pressure therapy and will be ordered a CPAP between 7 and 20 cm of water pressure with 3 cm expiratory pressure relief, humidifier and interface of her choice and comfort.  She should be advised that any problems with the CPAP operation or the mask fit have to be addressed with the durable medical equipment company immediately so that she does not become noncompliant. Noncompliance with CPAP therapy will lead to an insurance recall of the machine and the patient would not be able to get supplies. Follow-up with nurse practitioner Megan Milliken will be taking place between the 60th and 90th day of CPAP use. Compliance consists of daily use of CPAP for a minimum of 4 hours each night.     INTERPRETING PHYSICIAN:   Kanye Depree, MD   [facility].        

## 2022-10-15 ENCOUNTER — Telehealth: Payer: Self-pay

## 2022-10-15 MED ORDER — TOPIRAMATE 50 MG PO TABS: 50.0000 mg | ORAL_TABLET | Freq: Every day | ORAL | 3 refills | Status: DC

## 2022-10-15 NOTE — Telephone Encounter (Signed)
-----   Message from Melvyn Novas, MD sent at 10/13/2022  1:52 PM EDT ----- This HST is of limited validity as the recorded sleep time is below 4 hours. However,  it is very likely that it may not have captured the worst of the night, given that there was not much REM sleep recorded- and that a 4-hour or more recording would just have shown more of the same or worse.   This is severe sleep apnea even by CMS criteria this is still moderate severe sleep apnea.

## 2022-10-15 NOTE — Telephone Encounter (Signed)
Pt called back. Requesting a call back from nurse.  

## 2022-10-15 NOTE — Telephone Encounter (Signed)
Refill request

## 2022-10-15 NOTE — Telephone Encounter (Signed)
Contacted pt regarding HST, LVM rq call back.  

## 2022-10-15 NOTE — Telephone Encounter (Signed)
Contacted pt back, LVM rq call back  

## 2022-10-15 NOTE — Telephone Encounter (Signed)
Called pt back. LVM advising we will be returning from lunch after 1 is she would like to call back.

## 2022-10-16 ENCOUNTER — Encounter: Payer: Self-pay | Admitting: Neurology

## 2022-10-20 ENCOUNTER — Other Ambulatory Visit: Payer: Self-pay | Admitting: Cardiology

## 2022-10-20 ENCOUNTER — Other Ambulatory Visit: Payer: Self-pay | Admitting: Nurse Practitioner

## 2022-10-20 DIAGNOSIS — I429 Cardiomyopathy, unspecified: Secondary | ICD-10-CM

## 2022-10-22 ENCOUNTER — Other Ambulatory Visit: Payer: Self-pay | Admitting: Cardiology

## 2022-10-22 DIAGNOSIS — I429 Cardiomyopathy, unspecified: Secondary | ICD-10-CM

## 2022-10-23 ENCOUNTER — Other Ambulatory Visit: Payer: Self-pay | Admitting: Physical Medicine and Rehabilitation

## 2022-10-23 ENCOUNTER — Encounter: Payer: Self-pay | Admitting: Physical Medicine and Rehabilitation

## 2022-10-23 ENCOUNTER — Other Ambulatory Visit: Payer: Self-pay | Admitting: Nurse Practitioner

## 2022-10-23 DIAGNOSIS — M797 Fibromyalgia: Secondary | ICD-10-CM

## 2022-10-23 MED ORDER — MELOXICAM 7.5 MG PO TABS
7.5000 mg | ORAL_TABLET | Freq: Every day | ORAL | 0 refills | Status: DC
Start: 2022-10-23 — End: 2023-01-29

## 2022-10-23 MED ORDER — GABAPENTIN 600 MG PO TABS: 600.0000 mg | ORAL_TABLET | Freq: Four times a day (QID) | ORAL | 3 refills | Status: DC | PRN

## 2022-10-24 ENCOUNTER — Other Ambulatory Visit: Payer: Self-pay | Admitting: Neurology

## 2022-10-24 DIAGNOSIS — R0601 Orthopnea: Secondary | ICD-10-CM

## 2022-10-24 DIAGNOSIS — G4733 Obstructive sleep apnea (adult) (pediatric): Secondary | ICD-10-CM

## 2022-10-30 ENCOUNTER — Ambulatory Visit: Payer: 59 | Admitting: Cardiology

## 2022-10-31 ENCOUNTER — Other Ambulatory Visit: Payer: Self-pay | Admitting: Physical Medicine and Rehabilitation

## 2022-10-31 MED ORDER — RYBELSUS 7 MG PO TABS: 1.0000 | ORAL_TABLET | Freq: Every day | ORAL | 3 refills | Status: DC

## 2022-11-01 ENCOUNTER — Ambulatory Visit (INDEPENDENT_AMBULATORY_CARE_PROVIDER_SITE_OTHER): Payer: 59 | Admitting: Orthopedic Surgery

## 2022-11-01 DIAGNOSIS — M76821 Posterior tibial tendinitis, right leg: Secondary | ICD-10-CM

## 2022-11-05 ENCOUNTER — Ambulatory Visit: Payer: 59 | Admitting: Nurse Practitioner

## 2022-11-05 ENCOUNTER — Encounter: Payer: Self-pay | Admitting: Nurse Practitioner

## 2022-11-05 NOTE — Progress Notes (Deleted)
[name], CMA,acting as a Neurosurgeon for [name], FNP.,have documented all relevant documentation on the behalf of [name], FNP,as directed by  [name], FNP while in the presence of [name], FNP.  Subjective:  Patient ID: Ashley Lindsey , female    DOB: 1961/02/16 , 62 y.o.   MRN: [id number]  No chief complaint on file.   HPI  Patient presents today for BP and DM follow up, Patient reports compliance with medications. Patient denies any chest pain, SOB, or headache. Patient has no other concerns today.     Past Medical History:  Diagnosis Date  . Abdominal cramps   . Anxiety   . Arthritis   . breast cancer    s/p radiation- last dose 6/12//has been off Tamoxifen 8/12  . Chronic headaches   . Costochondritis   . Fatigue   . Fibromyalgia 03/06/2013  . GERD (gastroesophageal reflux disease)   . History of COVID-19   . Hypertension   . Lymphedema    RIGHT ARM-////  STATES USE LEFT ARM FOR BP'S  . Migraines   . Muscle spasms of head and/or neck    following to the breast  . Passed out    4th of july  . Personal history of radiation therapy 2010   lt breast   . S/P radiation therapy 08/19/07 - 10/10/07   Left Breast/5040 cGy/28 fractions with Boost for a toatl dose of 6300 dGy  . S/P radiation therapy 08/14/10 -10/03/10   right breast  . Sleep apnea    STOP BANG SCORE 5     Family History  Problem Relation Age of Onset  . Breast cancer Mother 31  . Heart disease Father   . Diabetes Sister   . Prostate cancer Maternal Grandfather 84  . Cancer Maternal Aunt 63       d. from "female cancer" possibly cervical  . Breast cancer Maternal Aunt 60  . Prostate cancer Maternal Uncle 78  . Prostate cancer Maternal Uncle 80  . Prostate cancer Paternal Uncle   . Prostate cancer Paternal Uncle   . Breast cancer Cousin 30       mat 1st cousin     Current Outpatient Medications:  .  acetaminophen (TYLENOL) 650 MG CR tablet, Take 650 mg by mouth every 8  (eight) hours as needed for pain., Disp: , Rfl:  .  atorvastatin (LIPITOR) 10 MG tablet, TAKE 1 TABLET BY MOUTH EVERY DAY AT NIGHT, Disp: 90 tablet, Rfl: 1 .  Bisacodyl (DULCOLAX PO), Take 1 capsule by mouth daily as needed (constipation). , Disp: , Rfl:  .  cholecalciferol 5000 units TABS, Take 1 tablet (5,000 Units total) by mouth 2 (two) times daily. (Patient taking differently: Take 5,000 Units by mouth daily.), Disp: , Rfl:  .  cyclobenzaprine (FLEXERIL) 10 MG tablet, TAKE 1 TABLET BY MOUTH THREE TIMES A DAY, Disp: 90 tablet, Rfl: 1 .  dexlansoprazole (DEXILANT) 60 MG capsule, TAKE 1 CAPSULE BY MOUTH EVERY DAY, Disp: 90 capsule, Rfl: 1 .  diclofenac Sodium (VOLTAREN) 1 % GEL, Apply 2 g topically 4 (four) times daily as needed., Disp: 100 g, Rfl: 1 .  diphenhydrAMINE (BENADRYL) 25 MG tablet, Take 1 tablet (25 mg total) by mouth every 6 (six) hours as needed for up to 20 doses (migraine). Take with reglan for migraine headache, Disp: 20 tablet, Rfl: 0 .  ELDERBERRY PO, Take 1 tablet by mouth daily., Disp: , Rfl:  .  ENTRESTO 24-26 MG,  TAKE 1 TABLET BY MOUTH TWICE A DAY, Disp: 180 tablet, Rfl: 0 .  ferrous sulfate 324 (65 Fe) MG TBEC, Take 325 mg by mouth 2 (two) times daily., Disp: , Rfl:  .  fish oil-omega-3 fatty acids 1000 MG capsule, Take 1 g by mouth daily., Disp: , Rfl:  .  Flaxseed, Linseed, (FLAXSEED OIL MAX STR PO), Take 1 capsule by mouth daily. , Disp: , Rfl:  .  gabapentin (NEURONTIN) 600 MG tablet, Take 1 tablet (600 mg total) by mouth 4 (four) times daily as needed., Disp: 120 tablet, Rfl: 3 .  Magnesium 100 MG TABS, Take 1 tablet by mouth daily. Takes 50 mg, Disp: , Rfl:  .  meloxicam (MOBIC) 7.5 MG tablet, Take 1 tablet (7.5 mg total) by mouth daily., Disp: 90 tablet, Rfl: 0 .  Multiple Vitamins-Minerals (MULTIVITAMIN & MINERAL PO), Take 1 tablet by mouth daily., Disp: , Rfl:  .  mupirocin ointment (BACTROBAN) 2 %, Apply 1 Application topically 2 (two) times daily. (Patient taking  differently: Apply 1 Application topically as needed.), Disp: 22 g, Rfl: 0 .  ondansetron (ZOFRAN) 4 MG tablet, TAKE 1 TABLET BY MOUTH DAILY AS NEEDED FOR NAUSEA OR VOMITING, Disp: 30 tablet, Rfl: 1 .  OVER THE COUNTER MEDICATION, Take 125 mg by mouth daily. [place]/Simethicone, Disp: , Rfl:  .  polyethylene glycol (MIRALAX / GLYCOLAX) 17 g packet, Take 17 g by mouth daily as needed., Disp: , Rfl:  .  propranolol (INDERAL) 10 MG tablet, TAKE 1 TABLET BY MOUTH TWICE A DAY *HOLD IF SYSTOLIC BP (TOP #) IS <100MMHG OR PULSE IS <60BPM (Patient taking differently: Take 10 mg by mouth at bedtime.), Disp: 180 tablet, Rfl: 1 .  Semaglutide (RYBELSUS) 7 MG TABS, Take 1 tablet (7 mg total) by mouth daily., Disp: 30 tablet, Rfl: 3 .  spironolactone (ALDACTONE) 25 MG tablet, TAKE 1 TABLET BY MOUTH EVERY DAY IN THE MORNING, Disp: 90 tablet, Rfl: 0 .  SYMBICORT 80-4.5 MCG/ACT inhaler, INHALE 2 PUFFS INTO THE LUNGS IN THE MORNING AND AT BEDTIME., Disp: 30.6 each, Rfl: 1 .  topiramate (TOPAMAX) 50 MG tablet, Take 1 tablet (50 mg total) by mouth at bedtime., Disp: 90 tablet, Rfl: 3 .  triamcinolone cream (KENALOG) 0.1 %, APPLY TO AFFECTED AREA (RASH) TWICE A DAY AS NEEDED, Disp: 60 g, Rfl: 1 .  Turmeric 500 MG CAPS, Take by mouth., Disp: , Rfl:  .  venlafaxine XR (EFFEXOR-XR) 150 MG 24 hr capsule, TAKE 1 CAPSULE BY MOUTH NIGHTLY WITH 75MG  CAPSULE FOR A TOTAL OF 225MG  NIGHTLY, Disp: 90 capsule, Rfl: 3 .  venlafaxine XR (EFFEXOR-XR) 75 MG 24 hr capsule, TAKE 1 CAPSULE BY MOUTH DAILY IN COMBINATION WITH XR 150 MG (TO EQUAL 225 MG TOTAL DAILY)., Disp: 90 capsule, Rfl: 2 .  Vitamin D, Ergocalciferol, (DRISDOL) 1.25 MG (50000 UNIT) CAPS capsule, TAKE 1 CAPSULE BY MOUTH EVERY 7 DAYS, Disp: 20 capsule, Rfl: 0 .  vitamin E 200 UNIT capsule, Take 200 Units by mouth daily., Disp: , Rfl:    Allergies  Allergen Reactions  . Aspirin     unknown  . Penicillins Hives and Swelling    Has patient had a PCN reaction causing  immediate rash, facial/tongue/throat swelling, SOB or lightheadedness with hypotension: Yes Has patient had a PCN reaction causing severe rash involving mucus membranes or skin necrosis: Yes Has patient had a PCN reaction that required hospitalization Yes Has patient had a PCN reaction occurring within the last 10 years:  No If all of the above answers are "NO", then may proceed with Cephalosporin use.      Review of Systems   There were no vitals filed for this visit. There is no height or weight on file to calculate BMI.  Wt Readings from Last 3 Encounters:  10/02/22 287 lb (130.2 kg)  10/01/22 280 lb (127 kg)  09/10/22 281 lb 12.8 oz (127.8 kg)    The 10-year ASCVD risk score (Arnett DK, et al., 2019) is: 25%   Values used to calculate the score:     Age: 13 years     Sex: Female     Is Non-Hispanic African American: Yes     Diabetic: Yes     Tobacco smoker: No     Systolic Blood Pressure: 148 mmHg     Is BP treated: Yes     HDL Cholesterol: 63 mg/dL     Total Cholesterol: 203 mg/dL  Objective:  Physical Exam      Assessment And Plan:  Essential hypertension  Type 2 diabetes mellitus with diabetic dermatitis, without long-term current use of insulin (HCC)    No follow-ups on file.  Patient was given opportunity to ask questions. Patient verbalized understanding of the plan and was able to repeat key elements of the plan. All questions were answered to their satisfaction.    Jeanell Sparrow, FNP, have reviewed all documentation for this visit. The documentation on 11/05/22 for the exam, diagnosis, procedures, and orders are all accurate and complete.   IF YOU HAVE BEEN REFERRED TO A SPECIALIST, IT MAY TAKE 1-2 WEEKS TO SCHEDULE/PROCESS THE REFERRAL. IF YOU HAVE NOT HEARD FROM US/SPECIALIST IN TWO WEEKS, PLEASE GIVE [place] A CALL AT [phone].

## 2022-11-09 ENCOUNTER — Encounter: Payer: Self-pay | Admitting: Orthopedic Surgery

## 2022-11-09 NOTE — Progress Notes (Signed)
Office Visit Note   Patient: Ashley Lindsey           Date of Birth: 1960-11-17           MRN: [id number] Visit Date: 11/01/2022              Requested by: [name], FNP [address] [place],  [place] [address] PCP: [name], FNP  Chief Complaint  Patient presents with   Right Leg - Follow-up    PTTD RLE      HPI: Posterior tibial tendon insufficiency, with pronated valgus foot, pain worse with cam boot  Assessment & Plan: Visit Diagnoses:  1. Posterior tibial tendinitis of right lower extremity     Plan: recommended ASO and voltaren gel, she is going on a cruise in October, possible surgery after return  Follow-Up Instructions: No follow-ups on file.   Ortho Exam  Patient is alert, oriented, no adenopathy, well-dressed, normal affect, normal respiratory effort. Good DP pulse, history of fibromyalgia, swelling and edema ocer PTT, pain in sinus tarsi, cannot do a heel raise, ganglion cyst PIP fifth toe  Imaging: No results found. No images are attached to the encounter.  Labs: Lab Results  Component Value Date   HGBA1C 5.8 (H) 03/05/2022   HGBA1C 5.9 (H) 10/16/2021   HGBA1C 6.6 (H) 02/08/2021   ESRSEDRATE 48 (H) 08/26/2019   LABURIC 4.8 08/26/2019   REPTSTATUS 06/12/2017 FINAL 06/10/2017   CULT >=100,000 COLONIES/mL ESCHERICHIA COLI (A) 06/10/2017   LABORGA ESCHERICHIA COLI (A) 06/10/2017     Lab Results  Component Value Date   ALBUMIN 4.3 03/29/2022   ALBUMIN 4.2 03/05/2022   ALBUMIN 4.6 02/08/2021    Lab Results  Component Value Date   MG 1.9 01/16/2022   MG 2.0 02/17/2020   Lab Results  Component Value Date   VD25OH 41.6 01/16/2022   VD25OH 32 12/26/2011   VD25OH 55 06/23/2010    No results found for: "PREALBUMIN"    Latest Ref Rng & Units 07/05/2022   12:15 PM 04/12/2022    4:13 PM 03/05/2022   11:10 AM  CBC EXTENDED  WBC 4.0 - 10.5 K/uL 8.9  9.6  10.0   RBC 3.87 - 5.11 Mil/uL 4.81  4.83  4.26   Hemoglobin  12.0 - 15.0 g/dL 19.1  47.8  8.2   HCT 29.5 - 46.0 % 39.3  36.7  29.2   Platelets 150.0 - 400.0 K/uL 351.0  293.0  259   NEUT# 1.4 - 7.7 K/uL 5.4  6.2    Lymph# 0.7 - 4.0 K/uL 2.7  2.6       There is no height or weight on file to calculate BMI.  Orders:  No orders of the defined types were placed in this encounter.  No orders of the defined types were placed in this encounter.    Procedures: No procedures performed  Clinical Data: No additional findings.  ROS:  All other systems negative, except as noted in the HPI. Review of Systems  Objective: Vital Signs: There were no vitals taken for this visit.  Specialty Comments:  No specialty comments available.  PMFS History: Patient Active Problem List   Diagnosis Date Noted   Tibialis posterior tendinitis 10/08/2022   OSA (obstructive sleep apnea) 09/04/2022   Claustrophobia 09/04/2022   Suboptimal nutrition (HCC) 07/05/2022   Cardiomyopathy, unspecified type (HCC) 07/05/2022   Essential hypertension 07/05/2022   Type 2 diabetes mellitus with diabetic dermatitis, without long-term current use of  insulin (HCC) 07/05/2022   Chronic gastric ulcer without hemorrhage and without perforation 04/30/2022   Gastric AVM 04/30/2022   Iron deficiency anemia 03/06/2022   Unilateral primary osteoarthritis, left knee 10/02/2021   Pulmonary nodules 07/12/2021   Chronic cough 07/12/2021   Avulsion fracture of middle phalanx of finger 04/18/2021   Tibial plateau fracture, left 04/18/2021   Asymmetrical sensorineural hearing loss 02/02/2021   Bilateral primary osteoarthritis of knee 02/09/2020   Chronic intermittent hypoxia with obstructive sleep apnea 08/24/2019   Chronic intractable headache 07/14/2019   Morning headache 07/14/2019   Loud snoring 07/14/2019   Super obesity 07/14/2019   Sleeps in sitting position due to orthopnea 07/14/2019   Chest pain due to GERD 07/14/2019   Nocturia more than twice per night 07/14/2019    Unilateral primary osteoarthritis, right knee 05/26/2019   Chronic migraine without aura without status migrainosus, not intractable 05/13/2019   Class 3 severe obesity due to excess calories without serious comorbidity with body mass index (BMI) of 40.0 to 44.9 in adult (HCC) 08/05/2018   Depression 08/05/2018   Chronic nonintractable headache 08/05/2018   Elevated blood-pressure reading without diagnosis of hypertension 06/06/2018   Chronic pain of right ankle 04/24/2018   Breast cancer of lower-outer quadrant of right female breast (HCC) 12/02/2014   Bronchospasm 09/21/2014   Dyspnea 09/21/2014   Chronic pain of right knee 09/21/2014   Fibromyalgia 03/06/2013   Dense breasts 03/06/2013   Mastalgia 03/06/2013   Back pain, lumbosacral 03/06/2013   Lymphedema of arm 03/06/2013   Atypical chest pain 01/06/2013   Breast cancer of upper-inner quadrant of left female breast (HCC) 09/08/2012   Family history of malignant neoplasm of breast 09/08/2012   S/P radiation therapy    History of breast cancer in female 08/13/2011   Past Medical History:  Diagnosis Date   Abdominal cramps    Anxiety    Arthritis    breast cancer    s/p radiation- last dose 6/12//has been off Tamoxifen 8/12   Chronic headaches    Costochondritis    Fatigue    Fibromyalgia 03/06/2013   GERD (gastroesophageal reflux disease)    History of COVID-19    Hypertension    Lymphedema    RIGHT ARM-////  STATES USE LEFT ARM FOR BP'S   Migraines    Muscle spasms of head and/or neck    following to the breast   Passed out    4th of july   Personal history of radiation therapy 2010   lt breast    S/P radiation therapy 08/19/07 - 10/10/07   Left Breast/5040 cGy/28 fractions with Boost for a toatl dose of 6300 dGy   S/P radiation therapy 08/14/10 -10/03/10   right breast   Sleep apnea    STOP BANG SCORE 5    Family History  Problem Relation Age of Onset   Breast cancer Mother 87   Heart disease Father     Diabetes Sister    Prostate cancer Maternal Grandfather 30   Cancer Maternal Aunt 63       d. from "female cancer" possibly cervical   Breast cancer Maternal Aunt 60   Prostate cancer Maternal Uncle 78   Prostate cancer Maternal Uncle 80   Prostate cancer Paternal Uncle    Prostate cancer Paternal Uncle    Breast cancer Cousin 30       mat 1st cousin    Past Surgical History:  Procedure Laterality Date   ABDOMINAL HYSTERECTOMY  with left salpingooophorectomy   BIOPSY  04/30/2022   Procedure: BIOPSY;  Surgeon: Sherrilyn Rist, MD;  Location: [facility];  Service: Gastroenterology;;   BREAST LUMPECTOMY  2009/2012   LEFT/RIGHT with lymph node dissection   COLONOSCOPY WITH PROPOFOL N/A 04/30/2022   Procedure: COLONOSCOPY WITH PROPOFOL;  Surgeon: Sherrilyn Rist, MD;  Location: [facility];  Service: Gastroenterology;  Laterality: N/A;   ESOPHAGOGASTRODUODENOSCOPY (EGD) WITH PROPOFOL N/A 04/30/2022   Procedure: ESOPHAGOGASTRODUODENOSCOPY (EGD) WITH PROPOFOL;  Surgeon: Sherrilyn Rist, MD;  Location: [facility];  Service: Gastroenterology;  Laterality: N/A;   HOT HEMOSTASIS N/A 04/30/2022   Procedure: HOT HEMOSTASIS (ARGON PLASMA COAGULATION/BICAP);  Surgeon: Sherrilyn Rist, MD;  Location: [facility];  Service: Gastroenterology;  Laterality: N/A;   KNEE ARTHROSCOPY     right   OOPHORECTOMY  2013   Social History   Occupational History   Occupation: disability  Tobacco Use   Smoking status: Former    Current packs/day: 0.25    Average packs/day: 0.3 packs/day for 1 year (0.3 ttl pk-yrs)    Types: Cigarettes   Smokeless tobacco: Never  Vaping Use   Vaping status: Never Used  Substance and Sexual Activity   Alcohol use: Not Currently   Drug use: No   Sexual activity: Not Currently    Birth control/protection: Surgical

## 2022-11-16 ENCOUNTER — Ambulatory Visit: Payer: 59 | Admitting: Cardiology

## 2022-11-16 ENCOUNTER — Encounter: Payer: Self-pay | Admitting: Cardiology

## 2022-11-16 VITALS — BP 126/68 | HR 93 | Resp 17 | Ht 62.0 in | Wt 283.0 lb

## 2022-11-16 DIAGNOSIS — R911 Solitary pulmonary nodule: Secondary | ICD-10-CM | POA: Diagnosis not present

## 2022-11-16 DIAGNOSIS — G4733 Obstructive sleep apnea (adult) (pediatric): Secondary | ICD-10-CM

## 2022-11-16 DIAGNOSIS — I10 Essential (primary) hypertension: Secondary | ICD-10-CM | POA: Diagnosis not present

## 2022-11-16 DIAGNOSIS — Z87891 Personal history of nicotine dependence: Secondary | ICD-10-CM | POA: Diagnosis not present

## 2022-11-16 DIAGNOSIS — I429 Cardiomyopathy, unspecified: Secondary | ICD-10-CM

## 2022-11-16 DIAGNOSIS — E782 Mixed hyperlipidemia: Secondary | ICD-10-CM | POA: Diagnosis not present

## 2022-11-16 NOTE — Progress Notes (Signed)
Date:  11/16/2022   ID:  Ashley Lindsey, DOB [birth year], MRN [id number]  PCP:  [name], FNP  Cardiologist: Tessa Lerner, DO, [facility]  (established care 11/11/2020)  Date: 11/16/22 Last Office Visit: 09/10/2022  Chief Complaint  Patient presents with   Cardiomyopathy, unspecified type   Dizziness   Follow-up    6 week   HPI  Ashley Lindsey is a 62 y.o. female whose past medical history and cardiovascular risk factors include: Cardiomyopathy, history of COVID-19 infection, TIA, HTN, OSA (not on CPAP as Nov 2022), fibromyalgia, history of breast cancer bilaterally status post chemotherapy and radiation, former smoker, obesity due to excess calories.  In the past during her workup for syncope/stroke echocardiogram was noted to have mildly reduced LVEF likely secondary to history of chemotherapy and radiation for bilateral breast cancer.  Prior MPI noted low risk study and cardiac monitor was overall unremarkable.  When she presented for 10-month follow-up visit in May 2024 patient had gained approximately 16 pounds over the last 6 months.  Shared decision was to start [facility] and discontinue Topamax.  Cardiac MRI was also recommended to reevaluate LVEF and to see if there is another cause for her underlying cardiomyopathy.  At the last office visit this a decision was to start [facility] to help facilitate weight loss.  Patient states that she was not approved.  But has started on Rybelsus under the care of her primary.  She also has been diagnosed with sleep apnea and is awaiting device delivery.  I am very hopeful that once she corrects her sleep apnea she will be more energetic able to move around increasing physical activity and hopefully continue her weight loss journey.  Clinically denies anginal chest pain or heart failure symptoms.   ALLERGIES: Allergies  Allergen Reactions   Aspirin     unknown   Penicillins Hives and Swelling    Has patient had a PCN reaction causing immediate  rash, facial/tongue/throat swelling, SOB or lightheadedness with hypotension: Yes Has patient had a PCN reaction causing severe rash involving mucus membranes or skin necrosis: Yes Has patient had a PCN reaction that required hospitalization Yes Has patient had a PCN reaction occurring within the last 10 years: No If all of the above answers are "NO", then may proceed with Cephalosporin use.     MEDICATION LIST PRIOR TO VISIT: Current Meds  Medication Sig   acetaminophen (TYLENOL) 650 MG CR tablet Take 650 mg by mouth every 8 (eight) hours as needed for pain.   atorvastatin (LIPITOR) 10 MG tablet TAKE 1 TABLET BY MOUTH EVERY DAY AT NIGHT   Bisacodyl (DULCOLAX PO) Take 1 capsule by mouth daily as needed (constipation).    cholecalciferol 5000 units TABS Take 1 tablet (5,000 Units total) by mouth 2 (two) times daily. (Patient taking differently: Take 5,000 Units by mouth daily.)   cyclobenzaprine (FLEXERIL) 10 MG tablet TAKE 1 TABLET BY MOUTH THREE TIMES A DAY (Patient taking differently: Take 10 mg by mouth as needed.)   dexlansoprazole (DEXILANT) 60 MG capsule TAKE 1 CAPSULE BY MOUTH EVERY DAY   ELDERBERRY PO Take 1 tablet by mouth daily.   ENTRESTO 24-26 MG TAKE 1 TABLET BY MOUTH TWICE A DAY   ferrous sulfate 324 (65 Fe) MG TBEC Take 325 mg by mouth 2 (two) times daily.   fish oil-omega-3 fatty acids 1000 MG capsule Take 1 g by mouth daily.   Flaxseed, Linseed, (FLAXSEED OIL MAX STR PO) Take 1 capsule by mouth  daily.    gabapentin (NEURONTIN) 600 MG tablet Take 1 tablet (600 mg total) by mouth 4 (four) times daily as needed.   meloxicam (MOBIC) 7.5 MG tablet Take 1 tablet (7.5 mg total) by mouth daily.   Multiple Vitamins-Minerals (MULTIVITAMIN & MINERAL PO) Take 1 tablet by mouth daily.   ondansetron (ZOFRAN) 4 MG tablet TAKE 1 TABLET BY MOUTH DAILY AS NEEDED FOR NAUSEA OR VOMITING   polyethylene glycol (MIRALAX / GLYCOLAX) 17 g packet Take 17 g by mouth daily as needed.   propranolol  (INDERAL) 10 MG tablet TAKE 1 TABLET BY MOUTH TWICE A DAY *HOLD IF SYSTOLIC BP (TOP #) IS <100MMHG OR PULSE IS <60BPM (Patient taking differently: Take 10 mg by mouth at bedtime.)   Semaglutide (RYBELSUS) 7 MG TABS Take 1 tablet (7 mg total) by mouth daily.   spironolactone (ALDACTONE) 25 MG tablet TAKE 1 TABLET BY MOUTH EVERY DAY IN THE MORNING   SYMBICORT 80-4.5 MCG/ACT inhaler INHALE 2 PUFFS INTO THE LUNGS IN THE MORNING AND AT BEDTIME.   topiramate (TOPAMAX) 50 MG tablet Take 1 tablet (50 mg total) by mouth at bedtime.   triamcinolone cream (KENALOG) 0.1 % APPLY TO AFFECTED AREA (RASH) TWICE A DAY AS NEEDED   Turmeric 500 MG CAPS Take by mouth.   venlafaxine XR (EFFEXOR-XR) 150 MG 24 hr capsule TAKE 1 CAPSULE BY MOUTH NIGHTLY WITH 75MG  CAPSULE FOR A TOTAL OF 225MG  NIGHTLY   venlafaxine XR (EFFEXOR-XR) 75 MG 24 hr capsule TAKE 1 CAPSULE BY MOUTH DAILY IN COMBINATION WITH XR 150 MG (TO EQUAL 225 MG TOTAL DAILY).   Vitamin D, Ergocalciferol, (DRISDOL) 1.25 MG (50000 UNIT) CAPS capsule TAKE 1 CAPSULE BY MOUTH EVERY 7 DAYS   vitamin E 200 UNIT capsule Take 200 Units by mouth daily.     PAST MEDICAL HISTORY: Past Medical History:  Diagnosis Date   Abdominal cramps    Anxiety    Arthritis    breast cancer    s/p radiation- last dose 6/12//has been off Tamoxifen 8/12   Chronic headaches    Costochondritis    Fatigue    Fibromyalgia 03/06/2013   GERD (gastroesophageal reflux disease)    History of COVID-19    Hypertension    Lymphedema    RIGHT ARM-////  STATES USE LEFT ARM FOR BP'S   Migraines    Muscle spasms of head and/or neck    following to the breast   Passed out    4th of july   Personal history of radiation therapy 2010   lt breast    S/P radiation therapy 08/19/07 - 10/10/07   Left Breast/5040 cGy/28 fractions with Boost for a toatl dose of 6300 dGy   S/P radiation therapy 08/14/10 -10/03/10   right breast   Sleep apnea    STOP BANG SCORE 5    PAST SURGICAL  HISTORY: Past Surgical History:  Procedure Laterality Date   ABDOMINAL HYSTERECTOMY     with left salpingooophorectomy   BIOPSY  04/30/2022   Procedure: BIOPSY;  Surgeon: Sherrilyn Rist, MD;  Location: [facility];  Service: Gastroenterology;;   BREAST LUMPECTOMY  2009/2012   LEFT/RIGHT with lymph node dissection   COLONOSCOPY WITH PROPOFOL N/A 04/30/2022   Procedure: COLONOSCOPY WITH PROPOFOL;  Surgeon: Sherrilyn Rist, MD;  Location: [facility];  Service: Gastroenterology;  Laterality: N/A;   ESOPHAGOGASTRODUODENOSCOPY (EGD) WITH PROPOFOL N/A 04/30/2022   Procedure: ESOPHAGOGASTRODUODENOSCOPY (EGD) WITH PROPOFOL;  Surgeon: Sherrilyn Rist, MD;  Location: [facility];  Service: Gastroenterology;  Laterality: N/A;   HOT HEMOSTASIS N/A 04/30/2022   Procedure: HOT HEMOSTASIS (ARGON PLASMA COAGULATION/BICAP);  Surgeon: Sherrilyn Rist, MD;  Location: [facility];  Service: Gastroenterology;  Laterality: N/A;   KNEE ARTHROSCOPY     right   OOPHORECTOMY  2013    FAMILY HISTORY: The patient family history includes Breast cancer (age of onset: 82) in her cousin; Breast cancer (age of onset: 3) in her maternal aunt; Breast cancer (age of onset: 20) in her mother; Cancer (age of onset: 41) in her maternal aunt; Diabetes in her sister; Heart disease in her father; Prostate cancer in her paternal uncle and paternal uncle; Prostate cancer (age of onset: 44) in her maternal uncle; Prostate cancer (age of onset: 50) in her maternal uncle; Prostate cancer (age of onset: 16) in her maternal grandfather.  SOCIAL HISTORY:  The patient  reports that she has quit smoking. Her smoking use included cigarettes. She has a 0.3 pack-year smoking history. She has never used smokeless tobacco. She reports that she does not currently use alcohol. She reports that she does not use drugs.  REVIEW OF SYSTEMS: Review of Systems  Constitutional: Positive for weight gain.  Cardiovascular:  Positive for dyspnea  on exertion, orthopnea (chronic) and palpitations. Negative for chest pain, claudication, irregular heartbeat, leg swelling, near-syncope, paroxysmal nocturnal dyspnea and syncope.  Respiratory:  Negative for shortness of breath.   Hematologic/Lymphatic: Negative for bleeding problem.  Musculoskeletal:  Negative for muscle cramps and myalgias.  Neurological:  Negative for dizziness and light-headedness.    PHYSICAL EXAM:    11/16/2022    2:49 PM 10/02/2022    1:43 PM 10/01/2022    4:11 PM  Vitals with BMI  Height 5\' 2"  5\' 2"  5\' 2"   Weight 283 lbs 287 lbs 280 lbs  BMI 51.75 52.48 51.2  Systolic 126 148 109  Diastolic 68 80 84  Pulse 93 106 105   Physical Exam  Constitutional: No distress.  Appears older than stated age, hemodynamically stable.   Neck:  Unable to evaluate JVP due to short neck stature and adipose tissue  Cardiovascular: Normal rate, regular rhythm, S1 normal, S2 normal, intact distal pulses and normal pulses. Exam reveals no gallop, no S3 and no S4.  No murmur heard. Pulmonary/Chest: Effort normal and breath sounds normal. No stridor. She has no wheezes. She has no rales.  Abdominal: Soft. Bowel sounds are normal. She exhibits no distension. There is no abdominal tenderness.  Obese  Musculoskeletal:        General: No edema.     Cervical back: Neck supple.  Neurological: She is alert and oriented to person, place, and time. She has intact cranial nerves (2-12).  Skin: Skin is warm and moist.   CARDIAC DATABASE: EKG: Sep 10, 2022: Normal sinus rhythm, 91 bpm, LAE, LVH per voltage criteria, without underlying injury pattern.  Echo:  12/08/2020:  Endocardial definition is not well visualized. This may limit accuracy.  Consider alternate imaging studies, if clinically indicated.   Left  ventricle cavity is normal in size. Mild concentric hypertrophy of the left ventricle. Mild global hypokinesis. LVEF probably 45%. Doppler evidence of grade I (impaired) diastolic  dysfunction, normal LAP.  Left atrial cavity is mildly dilated.  Mild (Grade I) mitral regurgitation.  Mild tricuspid regurgitation.  No evidence of pulmonary hypertension.  Stress Test: Lexiscan Tetrofosmin stress test 12/05/2020: Lexiscan nuclear stress test performed using 1-day protocol. Mildly decreased tracer uptake in  inferior myocardium, likely due to gut attenuation. Stress LVEF 62%. Low risk study.   CTA Head and Neck w/ and w/o contrast: 09/11/2020: 1. CT perfusion negative for acute infarct or ischemia 2. Negative for intracranial large vessel occlusion 3. No significant carotid or vertebral artery stenosis in the neck. No intracranial stenosis. 4. Code stroke imaging results were communicated on 09/11/2020 at 2:34 pm to provider Wilford Corner via text page  CT Head w/o contrast: 09/11/2020 1. Negative CT head 2. ASPECTS is 10 3. Code stroke imaging results were communicated on 09/11/2020 at 1:59 pm to provider Wilford Corner via text page  MRI brain with and without contrast: 10/04/2020 1. No specific cause for symptoms.  No acute or subacute infarct. 2. Mild white matter disease without specific demyelinating pattern.  Carotid artery duplex 12/08/2020:  The bifurcation, internal, external and common carotid arteries reveal no  evidence of significant stenosis, bilaterally. No significant plaque  burden noted bilaterally.  Right vertebral artery flow is not visualized. Left vertebral artery flow  is not visualized.  14-day mobile cardiac ambulatory telemetry: Patch Wear Time: 12 days and 0 hours  Dominant rhythm normal sinus, followed by sinus tachycardia (15% burden).  Heart rate 68-203 bpm. Avg HR 91 bpm. No atrial fibrillation, ventricular tachycardia, high grade AV block, pauses (3 seconds or longer). One episode of SVT, 5 beat duration, max HR of 203 bpm, and average 191 bpm.  Total ventricular ectopic burden <1%. Total supraventricular ectopic burden <1%. Patient triggered  events: 6. These did not correlate with any significant dysrhythmias.  LABORATORY DATA:    Latest Ref Rng & Units 07/05/2022   12:15 PM 04/12/2022    4:13 PM 03/05/2022   11:10 AM  CBC  WBC 4.0 - 10.5 K/uL 8.9  9.6  10.0   Hemoglobin 12.0 - 15.0 g/dL 16.1  09.6  8.2   Hematocrit 36.0 - 46.0 % 39.3  36.7  29.2   Platelets 150.0 - 400.0 K/uL 351.0  293.0  259        Latest Ref Rng & Units 03/29/2022   11:34 AM 03/05/2022   11:10 AM 10/16/2021   12:44 PM  CMP  Glucose 70 - 99 mg/dL  95  80   BUN 8 - 27 mg/dL  9  12   Creatinine 0.45 - 1.00 mg/dL  4.09  8.11   Sodium 914 - 144 mmol/L  140  138   Potassium 3.5 - 5.2 mmol/L  4.6  4.5   Chloride 96 - 106 mmol/L  103  98   CO2 20 - 29 mmol/L  26  24   Calcium 8.7 - 10.3 mg/dL  9.4  9.3   Total Protein 6.0 - 8.5 g/dL 7.4     Total Bilirubin 0.0 - 1.2 mg/dL <7.8     Alkaline Phos 44 - 121 IU/L 76     AST 0 - 40 IU/L 16     ALT 0 - 32 IU/L 13       Lipid Panel     Component Value Date/Time   CHOL 203 (H) 06/21/2022 1229   TRIG 193 (H) 06/21/2022 1229   HDL 63 06/21/2022 1229   CHOLHDL 3.2 06/21/2022 1229   LDLCALC 107 (H) 06/21/2022 1229   LABVLDL 33 06/21/2022 1229    No components found for: "NTPROBNP" No results for input(s): "PROBNP" in the last 8760 hours. No results for input(s): "TSH" in the last 8760 hours.  BMP Recent Labs    03/05/22 1110  NA 140  K 4.6  CL 103  CO2 26  GLUCOSE 95  BUN 9  CREATININE 0.71  CALCIUM 9.4    HEMOGLOBIN A1C Lab Results  Component Value Date   HGBA1C 5.8 (H) 03/05/2022     IMPRESSION:    ICD-10-CM   1. Cardiomyopathy, unspecified type (HCC)  I42.9     2. OSA (obstructive sleep apnea)  G47.33     3. Benign hypertension  I10     4. Mixed hyperlipidemia  E78.2     5. Class 3 severe obesity due to excess calories with serious comorbidity and body mass index (BMI) of 50.0 to 59.9 in adult (HCC)  E66.01    Z68.43     6. Former smoker  Z87.891     7. Pulmonary  nodule  R91.1         RECOMMENDATIONS: Ashley Lindsey is a 63 y.o. female whose past medical history and cardiac risk factors include: Cardiomyopathy, history of COVID-19 infection, TIA, HTN, OSA (not on CPAP yet dx in June 2022), fibromyalgia, history of breast cancer bilaterally status post chemotherapy and radiation, former smoker, obesity due to excess calories.  Cardiomyopathy, unspecified type (HCC) Mildly reduced LVEF likely secondary to prior history of chemotherapy/radiation for breast cancer. Stage B, NYHA class II. Echo: 45%, grade 1 diastolic impairment, mild LAE, mild MR/TR. MPI: Low risk. Medications reconciled. Unable to tolerate metoprolol and therefore propranolol. Recommended to hold Topamax given her headaches and cardiomyopathy. Patient did not prefer Wegovy and currently on Rybelsus tolerating the medication well.  Rybelsus is currently being filled by PCP. cMRI to reevaluate LVEF and other possibilities for her underlying cardiomyopathy.  Tentatively scheduled for September 2024. Lipids are followed / managed by PCP.   OSA (obstructive sleep apnea) Follows up with Dr. Vickey Huger. Awaiting device delivery. Reemphasized importance of compliance.  Benign hypertension Office blood pressures are very well controlled. Continue current medical therapy.  Pulmonary nodule CT PE protocol in the past there is a 2 mm nodule in the left lower lobe.  Given her history of smoking I have recommended her to follow-up with pulmonary medicine.  FINAL MEDICATION LIST END OF ENCOUNTER: No orders of the defined types were placed in this encounter.    Medications Discontinued During This Encounter  Medication Reason   Magnesium 100 MG TABS        Current Outpatient Medications:    acetaminophen (TYLENOL) 650 MG CR tablet, Take 650 mg by mouth every 8 (eight) hours as needed for pain., Disp: , Rfl:    atorvastatin (LIPITOR) 10 MG tablet, TAKE 1 TABLET BY MOUTH EVERY DAY AT  NIGHT, Disp: 90 tablet, Rfl: 1   Bisacodyl (DULCOLAX PO), Take 1 capsule by mouth daily as needed (constipation). , Disp: , Rfl:    cholecalciferol 5000 units TABS, Take 1 tablet (5,000 Units total) by mouth 2 (two) times daily. (Patient taking differently: Take 5,000 Units by mouth daily.), Disp: , Rfl:    cyclobenzaprine (FLEXERIL) 10 MG tablet, TAKE 1 TABLET BY MOUTH THREE TIMES A DAY (Patient taking differently: Take 10 mg by mouth as needed.), Disp: 90 tablet, Rfl: 1   dexlansoprazole (DEXILANT) 60 MG capsule, TAKE 1 CAPSULE BY MOUTH EVERY DAY, Disp: 90 capsule, Rfl: 1   ELDERBERRY PO, Take 1 tablet by mouth daily., Disp: , Rfl:    ENTRESTO 24-26 MG, TAKE 1 TABLET BY MOUTH TWICE A DAY, Disp: 180 tablet, Rfl: 0   ferrous sulfate 324 (65 Fe) MG TBEC,  Take 325 mg by mouth 2 (two) times daily., Disp: , Rfl:    fish oil-omega-3 fatty acids 1000 MG capsule, Take 1 g by mouth daily., Disp: , Rfl:    Flaxseed, Linseed, (FLAXSEED OIL MAX STR PO), Take 1 capsule by mouth daily. , Disp: , Rfl:    gabapentin (NEURONTIN) 600 MG tablet, Take 1 tablet (600 mg total) by mouth 4 (four) times daily as needed., Disp: 120 tablet, Rfl: 3   meloxicam (MOBIC) 7.5 MG tablet, Take 1 tablet (7.5 mg total) by mouth daily., Disp: 90 tablet, Rfl: 0   Multiple Vitamins-Minerals (MULTIVITAMIN & MINERAL PO), Take 1 tablet by mouth daily., Disp: , Rfl:    ondansetron (ZOFRAN) 4 MG tablet, TAKE 1 TABLET BY MOUTH DAILY AS NEEDED FOR NAUSEA OR VOMITING, Disp: 30 tablet, Rfl: 1   polyethylene glycol (MIRALAX / GLYCOLAX) 17 g packet, Take 17 g by mouth daily as needed., Disp: , Rfl:    propranolol (INDERAL) 10 MG tablet, TAKE 1 TABLET BY MOUTH TWICE A DAY *HOLD IF SYSTOLIC BP (TOP #) IS <100MMHG OR PULSE IS <60BPM (Patient taking differently: Take 10 mg by mouth at bedtime.), Disp: 180 tablet, Rfl: 1   Semaglutide (RYBELSUS) 7 MG TABS, Take 1 tablet (7 mg total) by mouth daily., Disp: 30 tablet, Rfl: 3   spironolactone (ALDACTONE)  25 MG tablet, TAKE 1 TABLET BY MOUTH EVERY DAY IN THE MORNING, Disp: 90 tablet, Rfl: 0   SYMBICORT 80-4.5 MCG/ACT inhaler, INHALE 2 PUFFS INTO THE LUNGS IN THE MORNING AND AT BEDTIME., Disp: 30.6 each, Rfl: 1   topiramate (TOPAMAX) 50 MG tablet, Take 1 tablet (50 mg total) by mouth at bedtime., Disp: 90 tablet, Rfl: 3   triamcinolone cream (KENALOG) 0.1 %, APPLY TO AFFECTED AREA (RASH) TWICE A DAY AS NEEDED, Disp: 60 g, Rfl: 1   Turmeric 500 MG CAPS, Take by mouth., Disp: , Rfl:    venlafaxine XR (EFFEXOR-XR) 150 MG 24 hr capsule, TAKE 1 CAPSULE BY MOUTH NIGHTLY WITH 75MG  CAPSULE FOR A TOTAL OF 225MG  NIGHTLY, Disp: 90 capsule, Rfl: 3   venlafaxine XR (EFFEXOR-XR) 75 MG 24 hr capsule, TAKE 1 CAPSULE BY MOUTH DAILY IN COMBINATION WITH XR 150 MG (TO EQUAL 225 MG TOTAL DAILY)., Disp: 90 capsule, Rfl: 2   Vitamin D, Ergocalciferol, (DRISDOL) 1.25 MG (50000 UNIT) CAPS capsule, TAKE 1 CAPSULE BY MOUTH EVERY 7 DAYS, Disp: 20 capsule, Rfl: 0   vitamin E 200 UNIT capsule, Take 200 Units by mouth daily., Disp: , Rfl:    diphenhydrAMINE (BENADRYL) 25 MG tablet, Take 1 tablet (25 mg total) by mouth every 6 (six) hours as needed for up to 20 doses (migraine). Take with reglan for migraine headache, Disp: 20 tablet, Rfl: 0   mupirocin ointment (BACTROBAN) 2 %, Apply 1 Application topically 2 (two) times daily. (Patient taking differently: Apply 1 Application topically as needed.), Disp: 22 g, Rfl: 0   OVER THE COUNTER MEDICATION, Take 125 mg by mouth daily. [place]/Simethicone, Disp: , Rfl:   No orders of the defined types were placed in this encounter.   There are no Patient Instructions on file for this visit.   --Continue cardiac medications as reconciled in final medication list. --Return in about 3 months (around 02/16/2023) for Follow up cardiomyopathy . Or sooner if needed. --Continue follow-up with your primary care physician regarding the management of your other chronic comorbid  conditions.  Patient's questions and concerns were addressed to her satisfaction. She voices understanding of  the instructions provided during this encounter.   This note was created using a voice recognition software as a result there may be grammatical errors inadvertently enclosed that do not reflect the nature of this encounter. Every attempt is made to correct such errors.  Tessa Lerner, [place], [facility]  Pager:  [phone] Office: [phone]

## 2022-11-19 ENCOUNTER — Telehealth: Payer: Self-pay

## 2022-11-19 MED ORDER — VENLAFAXINE HCL ER 75 MG PO CP24: 75.0000 mg | ORAL_CAPSULE | Freq: Two times a day (BID) | ORAL | 0 refills | Status: DC

## 2022-11-19 NOTE — Telephone Encounter (Signed)
Pt called with questions regarding her Venlafaxine. Attempted to return pt's call. LVM for call back.

## 2022-11-19 NOTE — Telephone Encounter (Signed)
S/w pt for 17 mins regarding her need for venlafezine. She stopped taking 150 mg and 75 mg and is now only taking 75 mg BID. She is asking for a renewed RX. She reports she never moved to [facility] and is asking for an appt with Lillard Anes, NP. Advised the pt per MD we have sent in a refill for effexor 75 mg BID. She will take this for one month and if she finds she would like to decrease more, we will advise her how to taper properly. She verbalized thanks and understanding

## 2022-11-20 ENCOUNTER — Ambulatory Visit (INDEPENDENT_AMBULATORY_CARE_PROVIDER_SITE_OTHER): Payer: 59 | Admitting: Nurse Practitioner

## 2022-11-20 ENCOUNTER — Encounter: Payer: Self-pay | Admitting: Nurse Practitioner

## 2022-11-20 VITALS — BP 128/70 | HR 86 | Temp 98.5°F | Ht 62.0 in | Wt 286.2 lb

## 2022-11-20 DIAGNOSIS — I10 Essential (primary) hypertension: Secondary | ICD-10-CM

## 2022-11-20 DIAGNOSIS — I429 Cardiomyopathy, unspecified: Secondary | ICD-10-CM

## 2022-11-20 DIAGNOSIS — C50212 Malignant neoplasm of upper-inner quadrant of left female breast: Secondary | ICD-10-CM | POA: Diagnosis not present

## 2022-11-20 DIAGNOSIS — Z170 Estrogen receptor positive status [ER+]: Secondary | ICD-10-CM | POA: Diagnosis not present

## 2022-11-20 DIAGNOSIS — R350 Frequency of micturition: Secondary | ICD-10-CM | POA: Diagnosis not present

## 2022-11-20 DIAGNOSIS — E7800 Pure hypercholesterolemia, unspecified: Secondary | ICD-10-CM | POA: Insufficient documentation

## 2022-11-20 DIAGNOSIS — E11620 Type 2 diabetes mellitus with diabetic dermatitis: Secondary | ICD-10-CM | POA: Diagnosis not present

## 2022-11-20 DIAGNOSIS — E559 Vitamin D deficiency, unspecified: Secondary | ICD-10-CM

## 2022-11-20 DIAGNOSIS — N390 Urinary tract infection, site not specified: Secondary | ICD-10-CM

## 2022-11-20 DIAGNOSIS — Z23 Encounter for immunization: Secondary | ICD-10-CM

## 2022-11-20 LAB — POCT URINALYSIS DIP (CLINITEK)
Bilirubin, UA: NEGATIVE
Glucose, UA: NEGATIVE mg/dL
Ketones, POC UA: NEGATIVE mg/dL
Nitrite, UA: POSITIVE — AB
Spec Grav, UA: 1.02 (ref 1.010–1.025)
Urobilinogen, UA: 0.2 E.U./dL
pH, UA: 7 (ref 5.0–8.0)

## 2022-11-20 MED ORDER — NITROFURANTOIN MONOHYD MACRO 100 MG PO CAPS
100.0000 mg | ORAL_CAPSULE | Freq: Two times a day (BID) | ORAL | 0 refills | Status: AC
Start: 2022-11-20 — End: 2022-11-25

## 2022-11-20 NOTE — Progress Notes (Signed)
[name], CMA,acting as a Neurosurgeon for [name], FNP.,have documented all relevant documentation on the behalf of [name], FNP,as directed by  [name], FNP while in the presence of [name], FNP.  Subjective:  Patient ID: Ashley Lindsey , female    DOB: [birth year] , 62 y.o.   MRN: [id number]  Chief Complaint  Patient presents with   Hypertension   Diabetes    HPI  Patient presents today for a dm and bp follow up, patient reports compliance with medications. Patient denies any chest pain, SOB, or headaches. Patient reports she is having urinary frequency but when she goes its only a drop. She has been taking 75 mg daily and stopped the 150 mg. She is now 75 mg twice a day by the oncology nurse. Rheumatologist feels the fibromyalgia may be getting more aggressive and has increased her gabapentin, sometimes can a change and sometimes not. She is scheduled to see [facility] tomorrow and has seen Cardiology she has a CT scan of the heart in September. She is to get her hearing aid to her left ear on August 7th and her CPAP on August 8th. She has been having more sleepless nights. She reports she is definitely getting her CPAP this time. She is going to the gym after she leaves the office.   She is currently on Rybelsus 7mg  given by her Rheumatologist. She does feel   BP Readings from Last 3 Encounters: 11/20/22 : 128/70 11/16/22 : 126/68 10/02/22 : (!) 148/80   Wt Readings from Last 3 Encounters: 11/20/22 : 256 lb 3.2 oz (116.2 kg) 11/16/22 : 283 lb (128.4 kg) 10/02/22 : 287 lb (130.2 kg)        Past Medical History:  Diagnosis Date   Abdominal cramps    Anxiety    Arthritis    breast cancer    s/p radiation- last dose 6/12//has been off Tamoxifen 8/12   Chronic headaches    Costochondritis    Fatigue    Fibromyalgia 03/06/2013   GERD (gastroesophageal reflux disease)    History of COVID-19    Hypertension    Lymphedema    RIGHT ARM-////   STATES USE LEFT ARM FOR BP'S   Migraines    Muscle spasms of head and/or neck    following to the breast   Passed out    4th of july   Personal history of radiation therapy 2010   lt breast    S/P radiation therapy 08/19/07 - 10/10/07   Left Breast/5040 cGy/28 fractions with Boost for a toatl dose of 6300 dGy   S/P radiation therapy 08/14/10 -10/03/10   right breast   Sleep apnea    STOP BANG SCORE 5     Family History  Problem Relation Age of Onset   Breast cancer Mother 69   Heart disease Father    Diabetes Sister    Prostate cancer Maternal Grandfather 53   Cancer Maternal Aunt 63       d. from "female cancer" possibly cervical   Breast cancer Maternal Aunt 60   Prostate cancer Maternal Uncle 78   Prostate cancer Maternal Uncle 80   Prostate cancer Paternal Uncle    Prostate cancer Paternal Uncle    Breast cancer Cousin 30       mat 1st cousin     Current Outpatient Medications:    acetaminophen (TYLENOL) 650 MG CR tablet, Take 650 mg by mouth every 8 (eight) hours as  needed for pain., Disp: , Rfl:    atorvastatin (LIPITOR) 10 MG tablet, TAKE 1 TABLET BY MOUTH EVERY DAY AT NIGHT, Disp: 90 tablet, Rfl: 1   Bisacodyl (DULCOLAX PO), Take 1 capsule by mouth daily as needed (constipation). , Disp: , Rfl:    cholecalciferol 5000 units TABS, Take 1 tablet (5,000 Units total) by mouth 2 (two) times daily. (Patient taking differently: Take 5,000 Units by mouth daily.), Disp: , Rfl:    cyclobenzaprine (FLEXERIL) 10 MG tablet, TAKE 1 TABLET BY MOUTH THREE TIMES A DAY (Patient taking differently: Take 10 mg by mouth as needed.), Disp: 90 tablet, Rfl: 1   dexlansoprazole (DEXILANT) 60 MG capsule, TAKE 1 CAPSULE BY MOUTH EVERY DAY, Disp: 90 capsule, Rfl: 1   diphenhydrAMINE (BENADRYL) 25 MG tablet, Take 1 tablet (25 mg total) by mouth every 6 (six) hours as needed for up to 20 doses (migraine). Take with reglan for migraine headache, Disp: 20 tablet, Rfl: 0   ELDERBERRY PO, Take 1 tablet  by mouth daily., Disp: , Rfl:    ENTRESTO 24-26 MG, TAKE 1 TABLET BY MOUTH TWICE A DAY, Disp: 180 tablet, Rfl: 0   ferrous sulfate 324 (65 Fe) MG TBEC, Take 325 mg by mouth 2 (two) times daily., Disp: , Rfl:    fish oil-omega-3 fatty acids 1000 MG capsule, Take 1 g by mouth daily., Disp: , Rfl:    Flaxseed, Linseed, (FLAXSEED OIL MAX STR PO), Take 1 capsule by mouth daily. , Disp: , Rfl:    gabapentin (NEURONTIN) 600 MG tablet, Take 1 tablet (600 mg total) by mouth 4 (four) times daily as needed., Disp: 120 tablet, Rfl: 3   meloxicam (MOBIC) 7.5 MG tablet, Take 1 tablet (7.5 mg total) by mouth daily., Disp: 90 tablet, Rfl: 0   Multiple Vitamins-Minerals (MULTIVITAMIN & MINERAL PO), Take 1 tablet by mouth daily., Disp: , Rfl:    mupirocin ointment (BACTROBAN) 2 %, Apply 1 Application topically 2 (two) times daily. (Patient taking differently: Apply 1 Application topically as needed.), Disp: 22 g, Rfl: 0   ondansetron (ZOFRAN) 4 MG tablet, TAKE 1 TABLET BY MOUTH DAILY AS NEEDED FOR NAUSEA OR VOMITING, Disp: 30 tablet, Rfl: 1   OVER THE COUNTER MEDICATION, Take 125 mg by mouth daily. [place]/Simethicone, Disp: , Rfl:    polyethylene glycol (MIRALAX / GLYCOLAX) 17 g packet, Take 17 g by mouth daily as needed., Disp: , Rfl:    propranolol (INDERAL) 10 MG tablet, TAKE 1 TABLET BY MOUTH TWICE A DAY *HOLD IF SYSTOLIC BP (TOP #) IS <100MMHG OR PULSE IS <60BPM (Patient taking differently: Take 10 mg by mouth at bedtime.), Disp: 180 tablet, Rfl: 1   Semaglutide (RYBELSUS) 7 MG TABS, Take 1 tablet (7 mg total) by mouth daily., Disp: 30 tablet, Rfl: 3   spironolactone (ALDACTONE) 25 MG tablet, TAKE 1 TABLET BY MOUTH EVERY DAY IN THE MORNING, Disp: 90 tablet, Rfl: 0   SYMBICORT 80-4.5 MCG/ACT inhaler, INHALE 2 PUFFS INTO THE LUNGS IN THE MORNING AND AT BEDTIME., Disp: 30.6 each, Rfl: 1   topiramate (TOPAMAX) 50 MG tablet, Take 1 tablet (50 mg total) by mouth at bedtime., Disp: 90 tablet, Rfl: 3   triamcinolone  cream (KENALOG) 0.1 %, APPLY TO AFFECTED AREA (RASH) TWICE A DAY AS NEEDED, Disp: 60 g, Rfl: 1   Turmeric 500 MG CAPS, Take by mouth., Disp: , Rfl:    venlafaxine XR (EFFEXOR-XR) 75 MG 24 hr capsule, Take 1 capsule (75 mg total) by  mouth in the morning and at bedtime., Disp: 60 capsule, Rfl: 0   Vitamin D, Ergocalciferol, (DRISDOL) 1.25 MG (50000 UNIT) CAPS capsule, TAKE 1 CAPSULE BY MOUTH EVERY 7 DAYS, Disp: 20 capsule, Rfl: 0   vitamin E 200 UNIT capsule, Take 200 Units by mouth daily., Disp: , Rfl:    Allergies  Allergen Reactions   Aspirin     unknown   Penicillins Hives and Swelling    Has patient had a PCN reaction causing immediate rash, facial/tongue/throat swelling, SOB or lightheadedness with hypotension: Yes Has patient had a PCN reaction causing severe rash involving mucus membranes or skin necrosis: Yes Has patient had a PCN reaction that required hospitalization Yes Has patient had a PCN reaction occurring within the last 10 years: No If all of the above answers are "NO", then may proceed with Cephalosporin use.      Review of Systems  Constitutional: Negative.   HENT: Negative.    Eyes: Negative.   Respiratory: Negative.    Cardiovascular: Negative.   Gastrointestinal: Negative.   Neurological: Negative.   Psychiatric/Behavioral: Negative.       Today's Vitals   11/20/22 1022  BP: 128/70  Pulse: 86  Temp: 98.5 F (36.9 C)  TempSrc: Oral  Weight: 286 lb 3.2 oz (129.8 kg)  Height: 5\' 2"  (1.575 m)  PainSc: 7   PainLoc: Back   Body mass index is 52.35 kg/m.  Wt Readings from Last 3 Encounters:  11/20/22 286 lb 3.2 oz (129.8 kg)  11/16/22 283 lb (128.4 kg)  10/02/22 287 lb (130.2 kg)     Objective:  Physical Exam Vitals reviewed.  Constitutional:      General: She is not in acute distress.    Appearance: Normal appearance. She is obese.  Cardiovascular:     Rate and Rhythm: Normal rate and regular rhythm.     Pulses: Normal pulses.     Heart  sounds: Normal heart sounds. No murmur heard. Pulmonary:     Effort: Pulmonary effort is normal. No respiratory distress.     Breath sounds: Normal breath sounds. No wheezing.  Musculoskeletal:        General: No swelling or signs of injury.  Skin:    General: Skin is warm.  Neurological:     General: No focal deficit present.     Mental Status: She is alert and oriented to person, place, and time.     Cranial Nerves: No cranial nerve deficit.     Motor: No weakness.  Psychiatric:        Attention and Perception: Attention normal.        Mood and Affect: Mood is depressed.        Behavior: Behavior normal.        Thought Content: Thought content normal.        Cognition and Memory: Cognition normal.        Judgment: Judgment normal.         Assessment And Plan:  Essential hypertension Assessment & Plan: Blood pressure is well-controlled.  Continue current medications.  Orders: -     Basic metabolic panel  Type 2 diabetes mellitus with diabetic dermatitis, without long-term current use of insulin (HCC) Assessment & Plan: Hemoglobin A1c is stable.  Continue current medications, tolerating well.  Orders: -     Hemoglobin A1c  Cardiomyopathy, unspecified type [facility]) Assessment & Plan: Continue follow-up with cardiology.  Continue current medications   Elevated cholesterol Assessment & Plan: Cholesterol levels have  slightly improved at last visit however triglycerides were increased.  Will recheck levels today.  Discussed importance of limiting intake of breads and sweets.   Vitamin D deficiency Assessment & Plan: Will check vitamin D level and supplement as needed.    Also encouraged to spend 15 minutes in the sun daily.    Orders: -     VITAMIN D 25 Hydroxy (Vit-D Deficiency, Fractures)  Malignant neoplasm of upper-inner quadrant of left breast in female, estrogen receptor positive (HCC) Assessment & Plan: Stable.  Continue follow-up with oncology.   Urinary  frequency -     POCT URINALYSIS DIP (CLINITEK)  Urinary tract infection without hematuria, site unspecified Assessment & Plan: Positive nitrates in her urine.  Will treat with nitrofurantoin and send urine for culture.  Encouraged to stay well-hydrated with water.  Orders: -     Nitrofurantoin Monohyd Macro; Take 1 capsule (100 mg total) by mouth 2 (two) times daily for 5 days.  Dispense: 10 capsule; Refill: 0 -     Urine Culture  Need for Tdap vaccination Assessment & Plan: Will give tetanus vaccine today while in office. Refer to order management. TDAP will be administered to adults 78-63 years old every 10 years.   Orders: -     Tdap vaccine greater than or equal to 7yo IM   Return for controlled DM check 4 months.   Patient was given opportunity to ask questions. Patient verbalized understanding of the plan and was able to repeat key elements of the plan. All questions were answered to their satisfaction.    Jeanell Sparrow, FNP, have reviewed all documentation for this visit. The documentation on 11/20/22 for the exam, diagnosis, procedures, and orders are all accurate and complete.   IF YOU HAVE BEEN REFERRED TO A SPECIALIST, IT MAY TAKE 1-2 WEEKS TO SCHEDULE/PROCESS THE REFERRAL. IF YOU HAVE NOT HEARD FROM US/SPECIALIST IN TWO WEEKS, PLEASE GIVE [place] A CALL AT [phone].

## 2022-11-21 ENCOUNTER — Ambulatory Visit (INDEPENDENT_AMBULATORY_CARE_PROVIDER_SITE_OTHER): Payer: 59 | Admitting: Clinical

## 2022-11-21 ENCOUNTER — Encounter: Payer: Self-pay | Admitting: Gastroenterology

## 2022-11-21 DIAGNOSIS — F321 Major depressive disorder, single episode, moderate: Secondary | ICD-10-CM | POA: Diagnosis not present

## 2022-11-21 NOTE — Progress Notes (Signed)
Time: 9:00am-10:00am CPT Code: 16109U Diagnosis: F32.1 (Major Depression, Single Episode, Moderate)  Ashley Lindsey was seen in person for individual therapy. Follow verbal review of consent forms, she completed an intake for therapy. For homework, she will reflect upon her goals for treatment in order to fine-tune her treatment plan. Suicidal ideation and intent were denied. She is scheduled to be seen again in two weeks.   Intake Presenting Problem Ashley Lindsey lives with her 26 year-old mother, whom she described as highly active from 5am-11/12pm. Ashley Lindsey was diagnosed with breast cancer in 2013 and underwent chemotherapy and radiation. She was deemed cancer 2016, and continued in remission at intake. She described her life as "spiralling out of control" due to her anxiety. She reported that she has taken a variety of anxiety medications. She reported that she had been actively maintaining her health by going to the gym until the past 6 months, but has not been going since then due to fatigue, which she attributes to her medications. She has been working on tapering off of her medications as much as possible. She described herself as highly spiritual. Ashley Lindsey reported that she would like to lose weight. She had previously been going to the gym regularly, and had lost 72 pounds. She has since regained 68 lbs. She shared that she feels as though she is depressed. Her rheumatologist has recommended that she take up walking. She reports that she gets ready to go to the gym, but "everytime I get ready to go, something comes up." To relax, Ashley Lindsey watches movies on television, listens to TV, reads scriptures, and meditates. Symptoms Ashley Lindsey endorsed symptoms including feelings of frustration, depressed mood, lack of motivation, difficulty initiating tasks, withdrawal from social situations. She described herself as normally talkative, and reported that she has been more quiet of late. Feelings of resentment that she is solely responsible  for her mother's care. She expressed fear of having depression, including stopping doing things that are important to her, and stopping communicating with important people in her life.  History of Problem Ashley Lindsey and her mother took care of her now-21 year old uncle. Ashley Lindsey has not worked for the past 6 years. She retired from [employer] in 2015. She has been in the medical field since high school. She reported that she has been a caregiver for much of her life in various forms. She reported that she has things that she wants to do, but obstacles tend to arise, be they medical, financial, or related to caring for her mother. Her mother fell ill with shingles.   Recent Trigger   Marital and Family Information: [name] has been divorced for 11 years.   Present family concerns/problems:  Strengths/resources in the family/friends: Ashley Lindsey has a 28 year-old daughter who lives in [place]. She is very close with her daughter, and they talk daily. Ashley Lindsey has a very close friend who is a Warden/ranger in [place].     Marital/sexual history patterns: has been divorced 11 years   Problems in family of origin: [name] has a 29 year old sister and two older brothers, in [place] and [place]. She reported that family members tend to tell her how to care for her mother.  Family background / ethnic factors: none  No needs/concerns related to ethnicity reported when asked: No  Education/Vocation  Interpersonal concerns/problems:  Personal strengths:  Military/work problems/concerns: Leisure Warden/ranger Status  No Legal Problems: No Medical/Nutritional Concerns: She described herself having high blood pressure. She has been in remission from breast cancer  since 2016. She was identified as pre-diabetic in July of 2024. At the time, she was told that she had been a pre-diabetic since 2021. She was diagnosed with fibromyalgia during chemotherapy. She went into remission after 6-9 months  of treatment, but cancer returned in 2015. She reported taking several psychotropic medications (venlafaxine 150mg  at night, 75 mg in daytime). She has previously taken Xanax and reported that she abused it at the time and experienced withdrawals when she went off of it in 2016. She reported that the venlafaxine makes her tired and promotes fatigued. She stopped taking it cold [place] two weeks prior to her initial session. She had resumed taking her venlafaxine at time of intake, 75mg  in morning, 75mg  at night. She had had difficulty sleeping in the weeks leading to intake. She is prescribed a CPAP machine and was planning on picking it up at time of intake. She was also planning on picking up a hearing aid at time of intake. [name] has lost hearing in her left ear, which she attributes to sinus related issues she has experienced in recent years. She shared a sentiment that she "retired to go to doctors." However, she reported good relationships with her doctors, especially her cardiologist and rheumatologist.   Substance use/abuse/dependence: none reported  Comments:   Religion/Spirituality: Christian, described self as religious  General Behavior: WNL Attire: WNL Gait: WNL Motor Activity: WNL Stream of Thought - Productivity: WNL Stream of thought - Progression: WNL Stream of thought - Language:  WNL Emotional tone and reactions - Mood: WNL  Emotional tone and reactions - Affect: WNL Mental trend/Content of thoughts - Perception: WNL  Mental trend/Content of thoughts - Orientation: WNL Mental trend/Content of thoughts - Memory: WNL Mental trend/Content of thoughts - General knowledge: WNL  Insight: WNL Judgment: WNL Intelligence: WNL Mental Status Comment: Client appears oriented to time and space. Overall functioning WNL Diagnostic Summary: Major Depressive Episode, single episode moderate              [name], PhD

## 2022-11-22 DIAGNOSIS — G4733 Obstructive sleep apnea (adult) (pediatric): Secondary | ICD-10-CM | POA: Diagnosis not present

## 2022-11-27 NOTE — Telephone Encounter (Signed)
Thank you for the note, I am just seeing it after being out of the office for 10 days.  These are different symptoms than she has previously reported, and I cannot determine the cause based on the description.  If she is still having the symptoms and does not feel as if she is constipated, then I recommend she go to an urgent care center or her primary care provider to be evaluated.  Unfortunately, we do not have clinic availability anytime soon.  HD

## 2022-11-28 DIAGNOSIS — Z23 Encounter for immunization: Secondary | ICD-10-CM | POA: Insufficient documentation

## 2022-11-28 DIAGNOSIS — N390 Urinary tract infection, site not specified: Secondary | ICD-10-CM | POA: Insufficient documentation

## 2022-11-28 DIAGNOSIS — R350 Frequency of micturition: Secondary | ICD-10-CM | POA: Insufficient documentation

## 2022-11-28 NOTE — Assessment & Plan Note (Signed)
Stable   Continue follow up with oncology

## 2022-11-28 NOTE — Assessment & Plan Note (Signed)
Cholesterol levels have slightly improved at last visit however triglycerides were increased.  Will recheck levels today.  Discussed importance of limiting intake of breads and sweets.

## 2022-11-28 NOTE — Assessment & Plan Note (Signed)
Continue follow-up with cardiology.  Continue current medications

## 2022-11-28 NOTE — Assessment & Plan Note (Signed)
Will check vitamin D level and supplement as needed.    Also encouraged to spend 15 minutes in the sun daily.   

## 2022-11-28 NOTE — Assessment & Plan Note (Signed)
Positive nitrates in her urine.  Will treat with nitrofurantoin and send urine for culture.  Encouraged to stay well-hydrated with water.

## 2022-11-28 NOTE — Assessment & Plan Note (Signed)
Hemoglobin A1c is stable.  Continue current medications, tolerating well.

## 2022-11-28 NOTE — Assessment & Plan Note (Signed)
Blood pressure is well controlled. Continue current medications. 

## 2022-11-28 NOTE — Assessment & Plan Note (Signed)
Will give tetanus vaccine today while in office. Refer to order management. TDAP will be administered to adults 79-64 years old every 10 years.

## 2022-11-29 ENCOUNTER — Ambulatory Visit: Payer: 59 | Admitting: Clinical

## 2022-11-30 ENCOUNTER — Encounter: Payer: Self-pay | Admitting: Nurse Practitioner

## 2022-12-10 ENCOUNTER — Telehealth: Payer: Self-pay | Admitting: Physician Assistant

## 2022-12-10 DIAGNOSIS — M76821 Posterior tibial tendinitis, right leg: Secondary | ICD-10-CM

## 2022-12-10 MED ORDER — DICLOFENAC SODIUM 1 % EX GEL: 2.0000 g | Freq: Four times a day (QID) | CUTANEOUS | 1 refills | Status: DC

## 2022-12-10 MED ORDER — DICLOFENAC SODIUM 1 % EX GEL
2.0000 g | Freq: Four times a day (QID) | CUTANEOUS | Status: DC
Start: 2022-12-10 — End: 2022-12-10

## 2022-12-10 NOTE — Telephone Encounter (Signed)
Patient called asked if she can get a Rx for the Voltaren Gel sent to her pharmacy? The number to contact patient is [phone]

## 2022-12-10 NOTE — Telephone Encounter (Signed)
Sent to pharmacy 

## 2022-12-10 NOTE — Addendum Note (Signed)
Addended by: Fredda Hammed L on: 12/10/2022 04:00 PM   Modules accepted: Orders

## 2022-12-13 ENCOUNTER — Other Ambulatory Visit: Payer: Self-pay | Admitting: Hematology and Oncology

## 2022-12-13 ENCOUNTER — Telehealth: Payer: Self-pay

## 2022-12-13 NOTE — Telephone Encounter (Signed)
LVM to schedule appt and f/u about Effexor 75 mg taper.

## 2022-12-14 ENCOUNTER — Encounter: Payer: Self-pay | Admitting: Physician Assistant

## 2022-12-14 ENCOUNTER — Ambulatory Visit: Payer: 59 | Admitting: Physician Assistant

## 2022-12-14 ENCOUNTER — Telehealth: Payer: Self-pay

## 2022-12-14 DIAGNOSIS — M1711 Unilateral primary osteoarthritis, right knee: Secondary | ICD-10-CM

## 2022-12-14 MED ORDER — DICLOFENAC SODIUM 1 % EX GEL: 2.0000 g | Freq: Four times a day (QID) | CUTANEOUS | 1 refills | Status: DC

## 2022-12-14 NOTE — Telephone Encounter (Signed)
Attempted to call pt in regards to venlafaxine refill. Pt is in need of refill but we need to find out if she is tolerating the taper as per previous conversation. LVM for call back,

## 2022-12-14 NOTE — Progress Notes (Signed)
Office Visit Note   Patient: Ashley Lindsey           Date of Birth: 01-12-1961           MRN: [id number] Visit Date: 12/14/2022              Requested by: [name], FNP [address] [place],  [place] [address] PCP: [name], FNP  Chief Complaint  Patient presents with   Right Knee - Pain      HPI: Patient is a pleasant 62 year old woman with a known history of osteoarthritis of her knees.  She is working on weight loss and exercise.  She comes in today requesting an injection into her right knee of steroid.  She did have a Durolane injection into her left knee a few months ago but did not find that it was helpful and more painful than what she expected no new injury.  Describes her pain is moderate  Assessment & Plan: Visit Diagnoses: Osteoarthritis right knee  Plan: Steroid injection given today into the right knee without difficulty.  May follow-up as needed.  Follow-Up Instructions: No follow-ups on file.   Ortho Exam  Patient is alert, oriented, no adenopathy, well-dressed, normal affect, normal respiratory effort. Right knee no erythema no effusion compartments are soft and compressible negative Denna Haggard' sign she is neurovascular intact  Imaging: No results found. No images are attached to the encounter.  Labs: Lab Results  Component Value Date   HGBA1C 6.3 (H) 11/20/2022   HGBA1C 5.8 (H) 03/05/2022   HGBA1C 5.9 (H) 10/16/2021   ESRSEDRATE 48 (H) 08/26/2019   LABURIC 4.8 08/26/2019   REPTSTATUS 06/12/2017 FINAL 06/10/2017   CULT >=100,000 COLONIES/mL ESCHERICHIA COLI (A) 06/10/2017   LABORGA ESCHERICHIA COLI (A) 06/10/2017     Lab Results  Component Value Date   ALBUMIN 4.3 03/29/2022   ALBUMIN 4.2 03/05/2022   ALBUMIN 4.6 02/08/2021    Lab Results  Component Value Date   MG 1.9 01/16/2022   MG 2.0 02/17/2020   Lab Results  Component Value Date   VD25OH 51.5 11/20/2022   VD25OH 41.6 01/16/2022   VD25OH 32 12/26/2011     No results found for: "PREALBUMIN"    Latest Ref Rng & Units 07/05/2022   12:15 PM 04/12/2022    4:13 PM 03/05/2022   11:10 AM  CBC EXTENDED  WBC 4.0 - 10.5 K/uL 8.9  9.6  10.0   RBC 3.87 - 5.11 Mil/uL 4.81  4.83  4.26   Hemoglobin 12.0 - 15.0 g/dL 78.2  95.6  8.2   HCT 21.3 - 46.0 % 39.3  36.7  29.2   Platelets 150.0 - 400.0 K/uL 351.0  293.0  259   NEUT# 1.4 - 7.7 K/uL 5.4  6.2    Lymph# 0.7 - 4.0 K/uL 2.7  2.6       There is no height or weight on file to calculate BMI.  Orders:  No orders of the defined types were placed in this encounter.  Meds ordered this encounter  Medications   diclofenac Sodium (VOLTAREN ARTHRITIS PAIN) 1 % GEL    Sig: Apply 2 g topically 4 (four) times daily.    Dispense:  100 g    Refill:  1     Procedures: No procedures performed  Clinical Data: No additional findings.  ROS:  All other systems negative, except as noted in the HPI. Review of Systems  Objective: Vital Signs: There were no vitals taken  for this visit.  Specialty Comments:  No specialty comments available.  PMFS History: Patient Active Problem List   Diagnosis Date Noted   Urinary frequency 11/28/2022   Urinary tract infection without hematuria 11/28/2022   Need for Tdap vaccination 11/28/2022   Elevated cholesterol 11/20/2022   Vitamin D deficiency 11/20/2022   Tibialis posterior tendinitis 10/08/2022   OSA (obstructive sleep apnea) 09/04/2022   Claustrophobia 09/04/2022   Suboptimal nutrition (HCC) 07/05/2022   Cardiomyopathy, unspecified type (HCC) 07/05/2022   Essential hypertension 07/05/2022   Type 2 diabetes mellitus with diabetic dermatitis, without long-term current use of insulin (HCC) 07/05/2022   Chronic gastric ulcer without hemorrhage and without perforation 04/30/2022   Gastric AVM 04/30/2022   Iron deficiency anemia 03/06/2022   Unilateral primary osteoarthritis, left knee 10/02/2021   Pulmonary nodules 07/12/2021   Chronic cough  07/12/2021   Avulsion fracture of middle phalanx of finger 04/18/2021   Tibial plateau fracture, left 04/18/2021   Asymmetrical sensorineural hearing loss 02/02/2021   Bilateral primary osteoarthritis of knee 02/09/2020   Chronic intermittent hypoxia with obstructive sleep apnea 08/24/2019   Chronic intractable headache 07/14/2019   Morning headache 07/14/2019   Loud snoring 07/14/2019   Super obesity 07/14/2019   Sleeps in sitting position due to orthopnea 07/14/2019   Chest pain due to GERD 07/14/2019   Nocturia more than twice per night 07/14/2019   Unilateral primary osteoarthritis, right knee 05/26/2019   Chronic migraine without aura without status migrainosus, not intractable 05/13/2019   Class 3 severe obesity due to excess calories without serious comorbidity with body mass index (BMI) of 40.0 to 44.9 in adult (HCC) 08/05/2018   Depression 08/05/2018   Chronic nonintractable headache 08/05/2018   Elevated blood-pressure reading without diagnosis of hypertension 06/06/2018   Chronic pain of right ankle 04/24/2018   Breast cancer of lower-outer quadrant of right female breast (HCC) 12/02/2014   Bronchospasm 09/21/2014   Dyspnea 09/21/2014   Chronic pain of right knee 09/21/2014   Fibromyalgia 03/06/2013   Dense breasts 03/06/2013   Mastalgia 03/06/2013   Back pain, lumbosacral 03/06/2013   Lymphedema of arm 03/06/2013   Atypical chest pain 01/06/2013   Breast cancer of upper-inner quadrant of left female breast (HCC) 09/08/2012   Family history of malignant neoplasm of breast 09/08/2012   S/P radiation therapy    History of breast cancer in female 08/13/2011   Past Medical History:  Diagnosis Date   Abdominal cramps    Anxiety    Arthritis    breast cancer    s/p radiation- last dose 6/12//has been off Tamoxifen 8/12   Chronic headaches    Costochondritis    Fatigue    Fibromyalgia 03/06/2013   GERD (gastroesophageal reflux disease)    History of COVID-19     Hypertension    Lymphedema    RIGHT ARM-////  STATES USE LEFT ARM FOR BP'S   Migraines    Muscle spasms of head and/or neck    following to the breast   Passed out    4th of july   Personal history of radiation therapy 2010   lt breast    S/P radiation therapy 08/19/07 - 10/10/07   Left Breast/5040 cGy/28 fractions with Boost for a toatl dose of 6300 dGy   S/P radiation therapy 08/14/10 -10/03/10   right breast   Sleep apnea    STOP BANG SCORE 5    Family History  Problem Relation Age of Onset   Breast cancer Mother 3  Heart disease Father    Diabetes Sister    Prostate cancer Maternal Grandfather 34   Cancer Maternal Aunt 63       d. from "female cancer" possibly cervical   Breast cancer Maternal Aunt 60   Prostate cancer Maternal Uncle 78   Prostate cancer Maternal Uncle 80   Prostate cancer Paternal Uncle    Prostate cancer Paternal Uncle    Breast cancer Cousin 30       mat 1st cousin    Past Surgical History:  Procedure Laterality Date   ABDOMINAL HYSTERECTOMY     with left salpingooophorectomy   BIOPSY  04/30/2022   Procedure: BIOPSY;  Surgeon: Sherrilyn Rist, MD;  Location: [facility];  Service: Gastroenterology;;   BREAST LUMPECTOMY  2009/2012   LEFT/RIGHT with lymph node dissection   COLONOSCOPY WITH PROPOFOL N/A 04/30/2022   Procedure: COLONOSCOPY WITH PROPOFOL;  Surgeon: Sherrilyn Rist, MD;  Location: [facility];  Service: Gastroenterology;  Laterality: N/A;   ESOPHAGOGASTRODUODENOSCOPY (EGD) WITH PROPOFOL N/A 04/30/2022   Procedure: ESOPHAGOGASTRODUODENOSCOPY (EGD) WITH PROPOFOL;  Surgeon: Sherrilyn Rist, MD;  Location: [facility];  Service: Gastroenterology;  Laterality: N/A;   HOT HEMOSTASIS N/A 04/30/2022   Procedure: HOT HEMOSTASIS (ARGON PLASMA COAGULATION/BICAP);  Surgeon: Sherrilyn Rist, MD;  Location: [facility];  Service: Gastroenterology;  Laterality: N/A;   KNEE ARTHROSCOPY     right   OOPHORECTOMY  2013   Social History    Occupational History   Occupation: disability  Tobacco Use   Smoking status: Former    Current packs/day: 0.25    Average packs/day: 0.3 packs/day for 1 year (0.3 ttl pk-yrs)    Types: Cigarettes   Smokeless tobacco: Never  Vaping Use   Vaping status: Never Used  Substance and Sexual Activity   Alcohol use: Not Currently   Drug use: No   Sexual activity: Not Currently    Birth control/protection: Surgical

## 2022-12-20 ENCOUNTER — Other Ambulatory Visit: Payer: Self-pay | Admitting: Nurse Practitioner

## 2022-12-20 DIAGNOSIS — R110 Nausea: Secondary | ICD-10-CM

## 2022-12-23 DIAGNOSIS — G4733 Obstructive sleep apnea (adult) (pediatric): Secondary | ICD-10-CM | POA: Diagnosis not present

## 2022-12-31 ENCOUNTER — Ambulatory Visit (INDEPENDENT_AMBULATORY_CARE_PROVIDER_SITE_OTHER): Payer: 59 | Admitting: Nurse Practitioner

## 2022-12-31 ENCOUNTER — Encounter: Payer: Self-pay | Admitting: Nurse Practitioner

## 2022-12-31 VITALS — BP 120/72 | HR 92 | Temp 97.8°F | Ht 62.0 in | Wt 285.8 lb

## 2022-12-31 DIAGNOSIS — G4486 Cervicogenic headache: Secondary | ICD-10-CM | POA: Diagnosis not present

## 2022-12-31 DIAGNOSIS — J392 Other diseases of pharynx: Secondary | ICD-10-CM | POA: Diagnosis not present

## 2022-12-31 DIAGNOSIS — N3000 Acute cystitis without hematuria: Secondary | ICD-10-CM

## 2022-12-31 DIAGNOSIS — R8290 Unspecified abnormal findings in urine: Secondary | ICD-10-CM | POA: Diagnosis not present

## 2022-12-31 DIAGNOSIS — N390 Urinary tract infection, site not specified: Secondary | ICD-10-CM | POA: Insufficient documentation

## 2022-12-31 DIAGNOSIS — Z2821 Immunization not carried out because of patient refusal: Secondary | ICD-10-CM

## 2022-12-31 LAB — POCT URINALYSIS DIP (CLINITEK)
Bilirubin, UA: NEGATIVE
Glucose, UA: NEGATIVE mg/dL
Ketones, POC UA: NEGATIVE mg/dL
Nitrite, UA: POSITIVE — AB
Spec Grav, UA: 1.025 (ref 1.010–1.025)
Urobilinogen, UA: 0.2 U/dL
pH, UA: 6 (ref 5.0–8.0)

## 2022-12-31 MED ORDER — CIPROFLOXACIN HCL 500 MG PO TABS
500.0000 mg | ORAL_TABLET | Freq: Two times a day (BID) | ORAL | 0 refills | Status: AC
Start: 2022-12-31 — End: 2023-01-03

## 2022-12-31 MED ORDER — KETOROLAC TROMETHAMINE 60 MG/2ML IM SOLN
60.0000 mg | Freq: Once | INTRAMUSCULAR | Status: AC
Start: 2022-12-31 — End: 2022-12-31
  Administered 2022-12-31: 60 mg via INTRAMUSCULAR

## 2022-12-31 NOTE — Progress Notes (Signed)
[name], CMA,acting as a Neurosurgeon for [name], FNP.,have documented all relevant documentation on the behalf of [name], FNP,as directed by  [name], FNP while in the presence of [name], FNP.  Subjective:  Patient ID: Ashley Lindsey , female    DOB: Jan 07, 1961 , 62 y.o.   MRN: [id number]  Chief Complaint  Patient presents with   urine smell    HPI  Patient presents today for headache, cloudy urine, poor appetite. Patient reports she has had a headache since Friday; she has taken tylenol as needed. Patient reports her urine is cloudy and has had a odor since last 02/09/2023.  Patient reports hot flashes recently.   She is unsure if the headache is related to stress. Patient also has a scratchy throat for a few days.   Her uncle passed away on 02/09/2023. She had been going to see him.   BP Readings from Last 3 Encounters: 12/31/22 : 120/72 11/20/22 : 128/70 11/16/22 : 126/68       Past Medical History:  Diagnosis Date   Abdominal cramps    Anxiety    Arthritis    breast cancer    s/p radiation- last dose 6/12//has been off Tamoxifen 8/12   Chronic headaches    Costochondritis    Fatigue    Fibromyalgia 03/06/2013   GERD (gastroesophageal reflux disease)    History of COVID-19    Hypertension    Lymphedema    RIGHT ARM-////  STATES USE LEFT ARM FOR BP'S   Migraines    Muscle spasms of head and/or neck    following to the breast   Passed out    4th of july   Personal history of radiation therapy 2010   lt breast    S/P radiation therapy 08/19/07 - 10/10/07   Left Breast/5040 cGy/28 fractions with Boost for a toatl dose of 6300 dGy   S/P radiation therapy 08/14/10 -10/03/10   right breast   Sleep apnea    STOP BANG SCORE 5     Family History  Problem Relation Age of Onset   Breast cancer Mother 63   Heart disease Father    Diabetes Sister    Prostate cancer Maternal Grandfather 13   Cancer Maternal Aunt 63       d. from "female cancer"  possibly cervical   Breast cancer Maternal Aunt 60   Prostate cancer Maternal Uncle 78   Prostate cancer Maternal Uncle 80   Prostate cancer Paternal Uncle    Prostate cancer Paternal Uncle    Breast cancer Cousin 30       mat 1st cousin     Current Outpatient Medications:    acetaminophen (TYLENOL) 650 MG CR tablet, Take 650 mg by mouth every 8 (eight) hours as needed for pain., Disp: , Rfl:    atorvastatin (LIPITOR) 10 MG tablet, TAKE 1 TABLET BY MOUTH EVERY DAY AT NIGHT, Disp: 90 tablet, Rfl: 1   Bisacodyl (DULCOLAX PO), Take 1 capsule by mouth daily as needed (constipation). , Disp: , Rfl:    cholecalciferol 5000 units TABS, Take 1 tablet (5,000 Units total) by mouth 2 (two) times daily. (Patient taking differently: Take 5,000 Units by mouth daily.), Disp: , Rfl:    ciprofloxacin (CIPRO) 500 MG tablet, Take 1 tablet (500 mg total) by mouth 2 (two) times daily for 3 days., Disp: 6 tablet, Rfl: 0   cyclobenzaprine (FLEXERIL) 10 MG tablet, Take 1 tablet (10 mg total) by mouth  as needed., Disp: 30 tablet, Rfl: 1   dexlansoprazole (DEXILANT) 60 MG capsule, TAKE 1 CAPSULE BY MOUTH EVERY DAY, Disp: 90 capsule, Rfl: 1   diclofenac Sodium (VOLTAREN ARTHRITIS PAIN) 1 % GEL, Apply 2 g topically 4 (four) times daily., Disp: 100 g, Rfl: 1   diphenhydrAMINE (BENADRYL) 25 MG tablet, Take 1 tablet (25 mg total) by mouth every 6 (six) hours as needed for up to 20 doses (migraine). Take with reglan for migraine headache, Disp: 20 tablet, Rfl: 0   ELDERBERRY PO, Take 1 tablet by mouth daily., Disp: , Rfl:    ENTRESTO 24-26 MG, TAKE 1 TABLET BY MOUTH TWICE A DAY, Disp: 180 tablet, Rfl: 0   ferrous sulfate 324 (65 Fe) MG TBEC, Take 325 mg by mouth 2 (two) times daily., Disp: , Rfl:    fish oil-omega-3 fatty acids 1000 MG capsule, Take 1 g by mouth daily., Disp: , Rfl:    Flaxseed, Linseed, (FLAXSEED OIL MAX STR PO), Take 1 capsule by mouth daily. , Disp: , Rfl:    gabapentin (NEURONTIN) 600 MG tablet, Take  1 tablet (600 mg total) by mouth 4 (four) times daily as needed., Disp: 120 tablet, Rfl: 3   meloxicam (MOBIC) 7.5 MG tablet, Take 1 tablet (7.5 mg total) by mouth daily., Disp: 90 tablet, Rfl: 0   Multiple Vitamins-Minerals (MULTIVITAMIN & MINERAL PO), Take 1 tablet by mouth daily., Disp: , Rfl:    mupirocin ointment (BACTROBAN) 2 %, Apply 1 Application topically 2 (two) times daily. (Patient taking differently: Apply 1 Application topically as needed.), Disp: 22 g, Rfl: 0   ondansetron (ZOFRAN) 4 MG tablet, TAKE 1 TABLET BY MOUTH DAILY AS NEEDED FOR NAUSEA OR VOMITING, Disp: 30 tablet, Rfl: 1   OVER THE COUNTER MEDICATION, Take 125 mg by mouth daily. [place]/Simethicone, Disp: , Rfl:    polyethylene glycol (MIRALAX / GLYCOLAX) 17 g packet, Take 17 g by mouth daily as needed., Disp: , Rfl:    propranolol (INDERAL) 10 MG tablet, TAKE 1 TABLET BY MOUTH TWICE A DAY *HOLD IF SYSTOLIC BP (TOP #) IS <100MMHG OR PULSE IS <60BPM (Patient taking differently: Take 10 mg by mouth at bedtime.), Disp: 180 tablet, Rfl: 1   Semaglutide (RYBELSUS) 7 MG TABS, Take 1 tablet (7 mg total) by mouth daily., Disp: 30 tablet, Rfl: 3   spironolactone (ALDACTONE) 25 MG tablet, TAKE 1 TABLET BY MOUTH EVERY DAY IN THE MORNING, Disp: 90 tablet, Rfl: 0   SYMBICORT 80-4.5 MCG/ACT inhaler, INHALE 2 PUFFS INTO THE LUNGS IN THE MORNING AND AT BEDTIME., Disp: 30.6 each, Rfl: 1   topiramate (TOPAMAX) 50 MG tablet, Take 1 tablet (50 mg total) by mouth at bedtime., Disp: 90 tablet, Rfl: 3   triamcinolone cream (KENALOG) 0.1 %, APPLY TO AFFECTED AREA (RASH) TWICE A DAY AS NEEDED, Disp: 60 g, Rfl: 1   Turmeric 500 MG CAPS, Take by mouth., Disp: , Rfl:    venlafaxine XR (EFFEXOR-XR) 75 MG 24 hr capsule, Take 1 capsule (75 mg total) by mouth in the morning and at bedtime., Disp: 180 capsule, Rfl: 1   Vitamin D, Ergocalciferol, (DRISDOL) 1.25 MG (50000 UNIT) CAPS capsule, TAKE 1 CAPSULE BY MOUTH EVERY 7 DAYS, Disp: 20 capsule, Rfl: 0    vitamin E 200 UNIT capsule, Take 200 Units by mouth daily., Disp: , Rfl:    Allergies  Allergen Reactions   Aspirin     unknown   Penicillins Hives and Swelling    Has patient  had a PCN reaction causing immediate rash, facial/tongue/throat swelling, SOB or lightheadedness with hypotension: Yes Has patient had a PCN reaction causing severe rash involving mucus membranes or skin necrosis: Yes Has patient had a PCN reaction that required hospitalization Yes Has patient had a PCN reaction occurring within the last 10 years: No If all of the above answers are "NO", then may proceed with Cephalosporin use.      Review of Systems  Constitutional: Negative.   HENT: Negative.    Eyes: Negative.   Respiratory: Negative.    Cardiovascular: Negative.   Gastrointestinal: Negative.  Negative for nausea and vomiting.  Genitourinary:  Negative for frequency.       Urine odor and cloudy urine  Neurological: Negative.   Psychiatric/Behavioral: Negative.       Today's Vitals   12/31/22 1132  BP: 120/72  Pulse: 92  Temp: 97.8 F (36.6 C)  TempSrc: Oral  Weight: 285 lb 12.8 oz (129.6 kg)  Height: 5\' 2"  (1.575 m)  PainSc: 5   PainLoc: Head   Body mass index is 52.27 kg/m.  Wt Readings from Last 3 Encounters:  12/31/22 285 lb 12.8 oz (129.6 kg)  11/20/22 286 lb 3.2 oz (129.8 kg)  11/16/22 283 lb (128.4 kg)      Objective:  Physical Exam Vitals reviewed.  Constitutional:      General: She is not in acute distress.    Appearance: Normal appearance. She is obese.  Cardiovascular:     Rate and Rhythm: Normal rate and regular rhythm.     Pulses: Normal pulses.     Heart sounds: Normal heart sounds. No murmur heard. Pulmonary:     Effort: Pulmonary effort is normal. No respiratory distress.     Breath sounds: Normal breath sounds. No wheezing.  Abdominal:     General: Abdomen is flat. Bowel sounds are normal. There is no distension.     Palpations: Abdomen is soft.   Musculoskeletal:        General: No swelling or signs of injury.  Skin:    General: Skin is warm.  Neurological:     General: No focal deficit present.     Mental Status: She is alert and oriented to person, place, and time.     Cranial Nerves: No cranial nerve deficit.     Motor: No weakness.  Psychiatric:        Attention and Perception: Attention normal.        Mood and Affect: Mood and affect normal. Mood is not depressed.        Behavior: Behavior normal.        Thought Content: Thought content normal.        Cognition and Memory: Cognition normal.        Judgment: Judgment normal.         Assessment And Plan:  Abnormal urine odor -     POCT URINALYSIS DIP (CLINITEK)  Acute cystitis without hematuria Assessment & Plan: Positive nitrates in her urine.  Will treat with cipro as her most recent UTI was in July and treated with nitrofuratoin and send urine for culture.  Encouraged to stay well-hydrated with water.  Orders: -     POCT URINALYSIS DIP (CLINITEK) -     Urine Culture -     Ciprofloxacin HCl; Take 1 tablet (500 mg total) by mouth 2 (two) times daily for 3 days.  Dispense: 6 tablet; Refill: 0  Cervicogenic headache Assessment & Plan: Likely related to  stress as her uncle passed last week. Will treat with Toradol 60 mg IM. Continue with tylenol as needed  Orders: -     Ketorolac Tromethamine  Throat irritation Assessment & Plan: Negative for rapid covid. Stay well hydrated with water.    Influenza vaccination declined Assessment & Plan: Patient declined influenza vaccination at this time. Patient is aware that influenza vaccine prevents illness in 70% of healthy people, and reduces hospitalizations to 30-70% in elderly. This vaccine is recommended annually. Education has been provided regarding the importance of this vaccine but patient still declined. Advised may receive this vaccine at local pharmacy or Health Dept.or vaccine clinic. Aware to provide a  copy of the vaccination record if obtained from local pharmacy or Health Dept.  Pt is willing to accept risk associated with refusing vaccination.      No follow-ups on file.  Patient was given opportunity to ask questions. Patient verbalized understanding of the plan and was able to repeat key elements of the plan. All questions were answered to their satisfaction.    Jeanell Sparrow, FNP, have reviewed all documentation for this visit. The documentation on 12/31/22 for the exam, diagnosis, procedures, and orders are all accurate and complete.   IF YOU HAVE BEEN REFERRED TO A SPECIALIST, IT MAY TAKE 1-2 WEEKS TO SCHEDULE/PROCESS THE REFERRAL. IF YOU HAVE NOT HEARD FROM US/SPECIALIST IN TWO WEEKS, PLEASE GIVE [place] A CALL AT [phone].

## 2022-12-31 NOTE — Assessment & Plan Note (Signed)
Positive nitrates in her urine.  Will treat with cipro as her most recent UTI was in July and treated with nitrofuratoin and send urine for culture.  Encouraged to stay well-hydrated with water.

## 2022-12-31 NOTE — Assessment & Plan Note (Signed)
Negative for rapid covid. Stay well hydrated with water.

## 2022-12-31 NOTE — Assessment & Plan Note (Signed)
Likely related to stress as her uncle passed last week. Will treat with Toradol 60 mg IM. Continue with tylenol as needed

## 2022-12-31 NOTE — Assessment & Plan Note (Signed)

## 2023-01-01 ENCOUNTER — Encounter: Payer: 59 | Attending: Physical Medicine and Rehabilitation | Admitting: Physical Medicine and Rehabilitation

## 2023-01-01 DIAGNOSIS — Z6379 Other stressful life events affecting family and household: Secondary | ICD-10-CM | POA: Insufficient documentation

## 2023-01-01 DIAGNOSIS — Z730 Burn-out: Secondary | ICD-10-CM | POA: Insufficient documentation

## 2023-01-01 DIAGNOSIS — Z8701 Personal history of pneumonia (recurrent): Secondary | ICD-10-CM | POA: Insufficient documentation

## 2023-01-01 DIAGNOSIS — Z6841 Body Mass Index (BMI) 40.0 and over, adult: Secondary | ICD-10-CM | POA: Insufficient documentation

## 2023-01-03 DIAGNOSIS — G4733 Obstructive sleep apnea (adult) (pediatric): Secondary | ICD-10-CM | POA: Diagnosis not present

## 2023-01-07 ENCOUNTER — Ambulatory Visit: Payer: 59 | Admitting: Clinical

## 2023-01-07 NOTE — Progress Notes (Unsigned)
[name], PhD

## 2023-01-11 ENCOUNTER — Ambulatory Visit (HOSPITAL_COMMUNITY)
Admission: EM | Admit: 2023-01-11 | Discharge: 2023-01-11 | Disposition: A | Discharge status: Home / Self Care | Payer: 59 | Attending: Family Medicine | Admitting: Family Medicine

## 2023-01-11 ENCOUNTER — Ambulatory Visit (INDEPENDENT_AMBULATORY_CARE_PROVIDER_SITE_OTHER): Payer: 59

## 2023-01-11 ENCOUNTER — Encounter (HOSPITAL_COMMUNITY): Payer: Self-pay | Admitting: Emergency Medicine

## 2023-01-11 DIAGNOSIS — R0602 Shortness of breath: Secondary | ICD-10-CM | POA: Insufficient documentation

## 2023-01-11 DIAGNOSIS — Z1152 Encounter for screening for COVID-19: Secondary | ICD-10-CM | POA: Diagnosis not present

## 2023-01-11 DIAGNOSIS — J189 Pneumonia, unspecified organism: Secondary | ICD-10-CM

## 2023-01-11 DIAGNOSIS — R918 Other nonspecific abnormal finding of lung field: Secondary | ICD-10-CM | POA: Diagnosis not present

## 2023-01-11 DIAGNOSIS — R059 Cough, unspecified: Secondary | ICD-10-CM | POA: Diagnosis not present

## 2023-01-11 DIAGNOSIS — J4521 Mild intermittent asthma with (acute) exacerbation: Secondary | ICD-10-CM | POA: Insufficient documentation

## 2023-01-11 DIAGNOSIS — R0989 Other specified symptoms and signs involving the circulatory and respiratory systems: Secondary | ICD-10-CM | POA: Diagnosis not present

## 2023-01-11 MED ORDER — DOXYCYCLINE HYCLATE 100 MG PO CAPS: 100.0000 mg | ORAL_CAPSULE | Freq: Two times a day (BID) | ORAL | 0 refills | Status: AC

## 2023-01-11 MED ORDER — PREDNISONE 20 MG PO TABS: 40.0000 mg | ORAL_TABLET | Freq: Every day | ORAL | 0 refills | Status: AC

## 2023-01-11 MED ORDER — BENZONATATE 100 MG PO CAPS: 100.0000 mg | ORAL_CAPSULE | Freq: Three times a day (TID) | ORAL | 0 refills | Status: DC | PRN

## 2023-01-11 MED ORDER — ALBUTEROL SULFATE HFA 108 (90 BASE) MCG/ACT IN AERS: 2.0000 | INHALATION_SPRAY | RESPIRATORY_TRACT | 0 refills | Status: AC | PRN

## 2023-01-11 NOTE — ED Triage Notes (Signed)
Pt has cough and sob that started Sunday night. Pt states she has done inhaler more frequently.

## 2023-01-11 NOTE — Discharge Instructions (Addendum)
Your chest x-ray shows some possible developing pneumonia in the right lower side.  The radiologist will also read your x-ray, and if their interpretation differs significantly from mine, we will call you.  Albuterol inhaler--do 2 puffs every 4 hours as needed for shortness of breath or wheezing  Take prednisone 20 mg--2 daily for 5 days  Take doxycycline 100 mg --1 capsule 2 times daily for 7 days; this is your antibiotic.  Take benzonatate 100 mg, 1 tab every 8 hours as needed for cough.   Use the Symbicort inhaler 2 puffs 2 times daily for the next 2 weeks.  You have been swabbed for COVID, and the test will result in the next 24 hours. Our staff will call you if positive. If the COVID test is positive, you should quarantine until you are fever free for 24 hours and you are starting to feel better, and then take added precautions for the next 5 days, such as physical distancing/wearing a mask and good hand hygiene/washing.

## 2023-01-11 NOTE — ED Provider Notes (Signed)
[facility]    CSN: [id number] Arrival date & time: 01/11/23  1411      History   Chief Complaint Chief Complaint  Patient presents with   Cough    HPI Ashley Lindsey is a 62 y.o. female.    Cough Here for cough and congestion that has been bothering her since the evening of September 15.  She also notes shortness of breath and has been using her Symbicort inhaler more often.  She states she does not have asthma and uses Symbicort as needed.  No fever and no nasal congestion since this started.  No vomiting or diarrhea.    Past Medical History:  Diagnosis Date   Abdominal cramps    Anxiety    Arthritis    breast cancer    s/p radiation- last dose 6/12//has been off Tamoxifen 8/12   Chronic headaches    Costochondritis    Fatigue    Fibromyalgia 03/06/2013   GERD (gastroesophageal reflux disease)    History of COVID-19    Hypertension    Lymphedema    RIGHT ARM-////  STATES USE LEFT ARM FOR BP'S   Migraines    Muscle spasms of head and/or neck    following to the breast   Passed out    4th of july   Personal history of radiation therapy 2010   lt breast    S/P radiation therapy 08/19/07 - 10/10/07   Left Breast/5040 cGy/28 fractions with Boost for a toatl dose of 6300 dGy   S/P radiation therapy 08/14/10 -10/03/10   right breast   Sleep apnea    STOP BANG SCORE 5    Patient Active Problem List   Diagnosis Date Noted   Abnormal urine odor 12/31/2022   Throat irritation 12/31/2022   Cervicogenic headache 12/31/2022   Influenza vaccination declined 12/31/2022   Urinary frequency 11/28/2022   Urinary tract infection without hematuria 11/28/2022   Need for Tdap vaccination 11/28/2022   Elevated cholesterol 11/20/2022   Vitamin D deficiency 11/20/2022   Tibialis posterior tendinitis 10/08/2022   OSA (obstructive sleep apnea) 09/04/2022   Claustrophobia 09/04/2022   Suboptimal nutrition (HCC) 07/05/2022   Cardiomyopathy, unspecified type (HCC)  07/05/2022   Essential hypertension 07/05/2022   Type 2 diabetes mellitus with diabetic dermatitis, without long-term current use of insulin (HCC) 07/05/2022   Chronic gastric ulcer without hemorrhage and without perforation 04/30/2022   Gastric AVM 04/30/2022   Iron deficiency anemia 03/06/2022   Unilateral primary osteoarthritis, left knee 10/02/2021   Pulmonary nodules 07/12/2021   Chronic cough 07/12/2021   Avulsion fracture of middle phalanx of finger 04/18/2021   Tibial plateau fracture, left 04/18/2021   Asymmetrical sensorineural hearing loss 02/02/2021   Bilateral primary osteoarthritis of knee 02/09/2020   Chronic intermittent hypoxia with obstructive sleep apnea 08/24/2019   Chronic intractable headache 07/14/2019   Morning headache 07/14/2019   Loud snoring 07/14/2019   Super obesity 07/14/2019   Sleeps in sitting position due to orthopnea 07/14/2019   Chest pain due to GERD 07/14/2019   Nocturia more than twice per night 07/14/2019   Unilateral primary osteoarthritis, right knee 05/26/2019   Chronic migraine without aura without status migrainosus, not intractable 05/13/2019   Class 3 severe obesity due to excess calories without serious comorbidity with body mass index (BMI) of 40.0 to 44.9 in adult [facility]) 08/05/2018   Depression 08/05/2018   Chronic nonintractable headache 08/05/2018   Elevated blood-pressure reading without diagnosis of hypertension 06/06/2018  Chronic pain of right ankle 04/24/2018   Breast cancer of lower-outer quadrant of right female breast (HCC) 12/02/2014   Bronchospasm 09/21/2014   Dyspnea 09/21/2014   Chronic pain of right knee 09/21/2014   Fibromyalgia 03/06/2013   Dense breasts 03/06/2013   Mastalgia 03/06/2013   Back pain, lumbosacral 03/06/2013   Lymphedema of arm 03/06/2013   Atypical chest pain 01/06/2013   Breast cancer of upper-inner quadrant of left female breast (HCC) 09/08/2012   Family history of malignant neoplasm of breast  09/08/2012   S/P radiation therapy    History of breast cancer in female 08/13/2011    Past Surgical History:  Procedure Laterality Date   ABDOMINAL HYSTERECTOMY     with left salpingooophorectomy   BIOPSY  04/30/2022   Procedure: BIOPSY;  Surgeon: Sherrilyn Rist, MD;  Location: [facility];  Service: Gastroenterology;;   BREAST LUMPECTOMY  2009/2012   LEFT/RIGHT with lymph node dissection   COLONOSCOPY WITH PROPOFOL N/A 04/30/2022   Procedure: COLONOSCOPY WITH PROPOFOL;  Surgeon: Sherrilyn Rist, MD;  Location: [facility];  Service: Gastroenterology;  Laterality: N/A;   ESOPHAGOGASTRODUODENOSCOPY (EGD) WITH PROPOFOL N/A 04/30/2022   Procedure: ESOPHAGOGASTRODUODENOSCOPY (EGD) WITH PROPOFOL;  Surgeon: Sherrilyn Rist, MD;  Location: [facility];  Service: Gastroenterology;  Laterality: N/A;   HOT HEMOSTASIS N/A 04/30/2022   Procedure: HOT HEMOSTASIS (ARGON PLASMA COAGULATION/BICAP);  Surgeon: Sherrilyn Rist, MD;  Location: [facility];  Service: Gastroenterology;  Laterality: N/A;   KNEE ARTHROSCOPY     right   OOPHORECTOMY  2013    OB History   No obstetric history on file.      Home Medications    Prior to Admission medications   Medication Sig Start Date End Date Taking? Authorizing Provider  albuterol (VENTOLIN HFA) 108 (90 Base) MCG/ACT inhaler Inhale 2 puffs into the lungs every 4 (four) hours as needed for wheezing or shortness of breath. 01/11/23  Yes Zenia Resides, MD  benzonatate (TESSALON) 100 MG capsule Take 1 capsule (100 mg total) by mouth 3 (three) times daily as needed for cough. 01/11/23  Yes Zenia Resides, MD  doxycycline (VIBRAMYCIN) 100 MG capsule Take 1 capsule (100 mg total) by mouth 2 (two) times daily for 7 days. 01/11/23 01/18/23 Yes Verley Pariseau, Janace Aris, MD  predniSONE (DELTASONE) 20 MG tablet Take 2 tablets (40 mg total) by mouth daily with breakfast for 5 days. 01/11/23 01/16/23 Yes Zenia Resides, MD  acetaminophen (TYLENOL) 650 MG  CR tablet Take 650 mg by mouth every 8 (eight) hours as needed for pain.    [provider]  atorvastatin (LIPITOR) 10 MG tablet TAKE 1 TABLET BY MOUTH EVERY DAY AT NIGHT 09/24/22   [name], FNP  Bisacodyl (DULCOLAX PO) Take 1 capsule by mouth daily as needed (constipation).     [provider]  cholecalciferol 5000 units TABS Take 1 tablet (5,000 Units total) by mouth 2 (two) times daily. Patient taking differently: Take 5,000 Units by mouth daily. 07/21/15   Serena Croissant, MD  cyclobenzaprine (FLEXERIL) 10 MG tablet Take 1 tablet (10 mg total) by mouth as needed. 12/21/22   [name], FNP  dexlansoprazole (DEXILANT) 60 MG capsule TAKE 1 CAPSULE BY MOUTH EVERY DAY 12/21/22   [name], FNP  diclofenac Sodium (VOLTAREN ARTHRITIS PAIN) 1 % GEL Apply 2 g topically 4 (four) times daily. 12/14/22   Persons, [place], PA  diphenhydrAMINE (BENADRYL) 25 MG tablet Take 1 tablet (25 mg  total) by mouth every 6 (six) hours as needed for up to 20 doses (migraine). Take with reglan for migraine headache 09/11/20   Terald Sleeper, MD  ELDERBERRY PO Take 1 tablet by mouth daily.    [provider]  ENTRESTO 24-26 MG TAKE 1 TABLET BY MOUTH TWICE A DAY 10/22/22   Tolia, Sunit, DO  ferrous sulfate 324 (65 Fe) MG TBEC Take 325 mg by mouth 2 (two) times daily.    [provider]  fish oil-omega-3 fatty acids 1000 MG capsule Take 1 g by mouth daily.    [provider]  Flaxseed, Linseed, (FLAXSEED OIL MAX STR PO) Take 1 capsule by mouth daily.     [provider]  gabapentin (NEURONTIN) 600 MG tablet Take 1 tablet (600 mg total) by mouth 4 (four) times daily as needed. 10/23/22   Raulkar, Drema Pry, MD  meloxicam (MOBIC) 7.5 MG tablet Take 1 tablet (7.5 mg total) by mouth daily. 10/23/22   [name], FNP  Multiple Vitamins-Minerals (MULTIVITAMIN & MINERAL PO) Take 1 tablet by mouth daily.    [provider]  mupirocin ointment (BACTROBAN) 2 % Apply  1 Application topically 2 (two) times daily. Patient taking differently: Apply 1 Application topically as needed. 11/29/21   Gustavus Bryant, FNP  ondansetron (ZOFRAN) 4 MG tablet TAKE 1 TABLET BY MOUTH DAILY AS NEEDED FOR NAUSEA OR VOMITING 12/21/22   [name], FNP  OVER THE COUNTER MEDICATION Take 125 mg by mouth daily. [place]/Simethicone    [provider]  polyethylene glycol (MIRALAX / GLYCOLAX) 17 g packet Take 17 g by mouth daily as needed.    [provider]  propranolol (INDERAL) 10 MG tablet TAKE 1 TABLET BY MOUTH TWICE A DAY *HOLD IF SYSTOLIC BP (TOP #) IS <100MMHG OR PULSE IS <60BPM Patient taking differently: Take 10 mg by mouth at bedtime. 07/23/22   Tolia, Sunit, DO  Semaglutide (RYBELSUS) 7 MG TABS Take 1 tablet (7 mg total) by mouth daily. 10/31/22   Raulkar, Drema Pry, MD  spironolactone (ALDACTONE) 25 MG tablet TAKE 1 TABLET BY MOUTH EVERY DAY IN THE MORNING 10/22/22   Tolia, Sunit, DO  SYMBICORT 80-4.5 MCG/ACT inhaler INHALE 2 PUFFS INTO THE LUNGS IN THE MORNING AND AT BEDTIME. 09/24/22   [name], FNP  topiramate (TOPAMAX) 50 MG tablet Take 1 tablet (50 mg total) by mouth at bedtime. 10/15/22   Raulkar, Drema Pry, MD  triamcinolone cream (KENALOG) 0.1 % APPLY TO AFFECTED AREA (RASH) TWICE A DAY AS NEEDED 10/23/22   [name], FNP  Turmeric 500 MG CAPS Take by mouth.    [provider]  venlafaxine XR (EFFEXOR-XR) 75 MG 24 hr capsule Take 1 capsule (75 mg total) by mouth in the morning and at bedtime. 12/17/22   Serena Croissant, MD  Vitamin D, Ergocalciferol, (DRISDOL) 1.25 MG (50000 UNIT) CAPS capsule TAKE 1 CAPSULE BY MOUTH EVERY 7 DAYS 07/24/22   Raulkar, Drema Pry, MD  vitamin E 200 UNIT capsule Take 200 Units by mouth daily.    [provider]    Family History Family History  Problem Relation Age of Onset   Breast cancer Mother 62   Heart disease Father    Diabetes Sister    Prostate cancer Maternal Grandfather 76   Cancer Maternal  Aunt 63       d. from "female cancer" possibly cervical   Breast cancer Maternal Aunt 60   Prostate cancer Maternal Uncle 78  Prostate cancer Maternal Uncle 80   Prostate cancer Paternal Uncle    Prostate cancer Paternal Uncle    Breast cancer Cousin 30       mat 1st cousin    Social History Social History   Tobacco Use   Smoking status: Former    Current packs/day: 0.25    Average packs/day: 0.3 packs/day for 1 year (0.3 ttl pk-yrs)    Types: Cigarettes   Smokeless tobacco: Never  Vaping Use   Vaping status: Never Used  Substance Use Topics   Alcohol use: Not Currently   Drug use: No     Allergies   Aspirin and Penicillins   Review of Systems Review of Systems  Respiratory:  Positive for cough.      Physical Exam Triage Vital Signs ED Triage Vitals  Encounter Vitals Group     BP 01/11/23 1429 131/82     Systolic BP Percentile --      Diastolic BP Percentile --      Pulse Rate 01/11/23 1429 96     Resp --      Temp 01/11/23 1429 98.9 F (37.2 C)     Temp Source 01/11/23 1429 Oral     SpO2 01/11/23 1429 93 %     Weight --      Height --      Head Circumference --      Peak Flow --      Pain Score 01/11/23 1424 0     Pain Loc --      Pain Education --      Exclude from Growth Chart --    No data found.  Updated Vital Signs BP 131/82 (BP Location: Right Arm)   Pulse 96   Temp 98.9 F (37.2 C) (Oral)   SpO2 93%   Visual Acuity Right Eye Distance:   Left Eye Distance:   Bilateral Distance:    Right Eye Near:   Left Eye Near:    Bilateral Near:     Physical Exam Vitals reviewed.  Constitutional:      General: She is not in acute distress.    Appearance: She is not ill-appearing, toxic-appearing or diaphoretic.  HENT:     Right Ear: Tympanic membrane and ear canal normal.     Left Ear: Tympanic membrane and ear canal normal.     Nose: Nose normal. No congestion.     Mouth/Throat:     Mouth: Mucous membranes are moist.     Pharynx: No  oropharyngeal exudate or posterior oropharyngeal erythema.  Eyes:     Extraocular Movements: Extraocular movements intact.     Conjunctiva/sclera: Conjunctivae normal.     Pupils: Pupils are equal, round, and reactive to light.  Cardiovascular:     Rate and Rhythm: Normal rate and regular rhythm.     Heart sounds: No murmur heard. Pulmonary:     Effort: No respiratory distress.     Breath sounds: No stridor. No wheezing, rhonchi or rales.     Comments: Expiratory breath sounds are diminished bilaterally.  Overall air movement is still good Musculoskeletal:     Cervical back: Neck supple.  Lymphadenopathy:     Cervical: No cervical adenopathy.  Skin:    Capillary Refill: Capillary refill takes less than 2 seconds.     Coloration: Skin is not jaundiced or pale.  Neurological:     General: No focal deficit present.     Mental Status: She is alert and oriented to person,  place, and time.  Psychiatric:        Behavior: Behavior normal.      UC Treatments / Results  Labs (all labs ordered are listed, but only abnormal results are displayed) Labs Reviewed  SARS CORONAVIRUS 2 (TAT 6-24 HRS)    EKG   Radiology No results found.  Procedures Procedures (including critical care time)  Medications Ordered in UC Medications - No data to display  Initial Impression / Assessment and Plan / UC Course  I have reviewed the triage vital signs and the nursing notes.  Pertinent labs & imaging results that were available during my care of the patient were reviewed by me and considered in my medical decision making (see chart for details).       By my review there is some possible consolidation in the right lower lobe, compared to chest x-rays done previously in our system.  Patient is advised of radiology over read.  Prednisone is sent in for asthma exacerbation as his albuterol.  I have instructed her also to use her Symbicort 2 times daily instead of as needed. Doxycycline is  sent in for potential pneumonia.  COVID swab is done today.  I discussed with her that this is day 5 and so her results will be unlikely to come for the 5-day mark.  I still think with her health problems that include heart failure, that is important for her to know if COVID is part of this picture.   Final Clinical Impressions(s) / UC Diagnoses   Final diagnoses:  Community acquired pneumonia of right lower lobe of lung  Mild intermittent asthma with exacerbation     Discharge Instructions      Your chest x-ray shows some possible developing pneumonia in the right lower side.  The radiologist will also read your x-ray, and if their interpretation differs significantly from mine, we will call you.  Albuterol inhaler--do 2 puffs every 4 hours as needed for shortness of breath or wheezing  Take prednisone 20 mg--2 daily for 5 days  Take doxycycline 100 mg --1 capsule 2 times daily for 7 days; this is your antibiotic.  Take benzonatate 100 mg, 1 tab every 8 hours as needed for cough.   Use the Symbicort inhaler 2 puffs 2 times daily for the next 2 weeks.  You have been swabbed for COVID, and the test will result in the next 24 hours. Our staff will call you if positive. If the COVID test is positive, you should quarantine until you are fever free for 24 hours and you are starting to feel better, and then take added precautions for the next 5 days, such as physical distancing/wearing a mask and good hand hygiene/washing.       ED Prescriptions     Medication Sig Dispense Auth. Provider   albuterol (VENTOLIN HFA) 108 (90 Base) MCG/ACT inhaler Inhale 2 puffs into the lungs every 4 (four) hours as needed for wheezing or shortness of breath. 1 each Zenia Resides, MD   predniSONE (DELTASONE) 20 MG tablet Take 2 tablets (40 mg total) by mouth daily with breakfast for 5 days. 10 tablet Zenia Resides, MD   doxycycline (VIBRAMYCIN) 100 MG capsule Take 1 capsule (100 mg total)  by mouth 2 (two) times daily for 7 days. 14 capsule Kevona Lupinacci, Janace Aris, MD   benzonatate (TESSALON) 100 MG capsule Take 1 capsule (100 mg total) by mouth 3 (three) times daily as needed for cough. 21 capsule Zenia Resides,  MD      PDMP not reviewed this encounter.   Zenia Resides, MD 01/11/23 [phone]

## 2023-01-12 ENCOUNTER — Encounter: Payer: Self-pay | Admitting: Nurse Practitioner

## 2023-01-12 LAB — SARS CORONAVIRUS 2 (TAT 6-24 HRS): SARS Coronavirus 2: NEGATIVE

## 2023-01-15 ENCOUNTER — Encounter (HOSPITAL_BASED_OUTPATIENT_CLINIC_OR_DEPARTMENT_OTHER): Payer: 59 | Admitting: Physical Medicine and Rehabilitation

## 2023-01-15 ENCOUNTER — Encounter (HOSPITAL_COMMUNITY): Payer: Self-pay

## 2023-01-15 ENCOUNTER — Encounter: Payer: Self-pay | Admitting: Nurse Practitioner

## 2023-01-15 ENCOUNTER — Telehealth: Payer: Self-pay

## 2023-01-15 VITALS — BP 136/91 | HR 96 | Ht 62.0 in | Wt 293.0 lb

## 2023-01-15 DIAGNOSIS — Z6379 Other stressful life events affecting family and household: Secondary | ICD-10-CM | POA: Diagnosis not present

## 2023-01-15 DIAGNOSIS — Z8701 Personal history of pneumonia (recurrent): Secondary | ICD-10-CM | POA: Diagnosis not present

## 2023-01-15 DIAGNOSIS — Z730 Burn-out: Secondary | ICD-10-CM | POA: Diagnosis not present

## 2023-01-15 DIAGNOSIS — Z6841 Body Mass Index (BMI) 40.0 and over, adult: Secondary | ICD-10-CM

## 2023-01-15 DIAGNOSIS — Z0000 Encounter for general adult medical examination without abnormal findings: Secondary | ICD-10-CM | POA: Diagnosis not present

## 2023-01-15 MED ORDER — RYBELSUS 14 MG PO TABS: 1.0000 | ORAL_TABLET | Freq: Every day | ORAL | 3 refills | Status: DC

## 2023-01-15 NOTE — Progress Notes (Signed)
Subjective:    Patient ID: Ashley Lindsey, female    DOB: 1960/06/12, 62 y.o.   MRN: [id number]  HPI  Mrs. Molinaro is a 62 year old woman who presents for f/u of lymphedema, lower extremity pain, and fibromyalgia  1) LUE lymphedema -garment was too expensive -when it flares it gives her no warning -she received a booklet and she does the therapy and sometimes it works and sometimes it doesn't -worse in the biceps -aches and wakes her up  2) Fibromyalgia -ran out of her gabapentin and she really felt this -working out in [employer] and [employer] -up and down with emotions -had to put her uncle in a nursing home and this circles her -she is cleaning out her uncle's room -she has had more flares recently.   3) Lower extremity pain -she sometimes feels that it radiates from her back -has not had any recent spinal imaging -she would be interested in increasing her dose of topamax  4) Obesity -she asks for alternatives to wegivy  5) Headaches: -taking tylenol but is wary of taking too much  6) Knee pain: -got viscosupplementation -has been in excruciating pain ever since  7) Stress due to illness of family members: -she lost two family members recently  8) Caregiver burnout: -she is still taking care of her mom full time  76) Recent pneumonia: -discussed recent pneumonia  Pain Inventory Average Pain 9 Pain Right Now 9 My pain is constant, dull, and aching  In the last 24 hours, has pain interfered with the following? General activity 9 Relation with others 9 Enjoyment of life 9 What TIME of day is your pain at its worst? varies Sleep (in general) Fair  Pain is worse with: walking, sitting, inactivity, standing, and some activites Pain improves with: rest, heat/ice, therapy/exercise, pacing activities, medication, and injections Relief from Meds: 9  Family History  Problem Relation Age of Onset   Breast cancer Mother 46   Heart disease Father     Diabetes Sister    Prostate cancer Maternal Grandfather 60   Cancer Maternal Aunt 63       d. from "female cancer" possibly cervical   Breast cancer Maternal Aunt 60   Prostate cancer Maternal Uncle 78   Prostate cancer Maternal Uncle 80   Prostate cancer Paternal Uncle    Prostate cancer Paternal Uncle    Breast cancer Cousin 30       mat 1st cousin   Social History   Socioeconomic History   Marital status: Divorced    Spouse name: Not on file   Number of children: 1   Years of education: Not on file   Highest education level: Not on file  Occupational History   Occupation: disability  Tobacco Use   Smoking status: Former    Current packs/day: 0.25    Average packs/day: 0.3 packs/day for 1 year (0.3 ttl pk-yrs)    Types: Cigarettes   Smokeless tobacco: Never  Vaping Use   Vaping status: Never Used  Substance and Sexual Activity   Alcohol use: Not Currently   Drug use: No   Sexual activity: Not Currently    Birth control/protection: Surgical  Other Topics Concern   Not on file  Social History Narrative   Right handed    Coffee daily, sometimes Ginger ale    Social Determinants of Health   Financial Resource Strain: Low Risk  (02/08/2022)   Overall Financial Resource Strain (CARDIA)    Difficulty  of Paying Living Expenses: Not hard at all  Food Insecurity: No Food Insecurity (02/08/2022)   Hunger Vital Sign    Worried About Running Out of Food in the Last Year: Never true    Ran Out of Food in the Last Year: Never true  Transportation Needs: No Transportation Needs (02/08/2022)   PRAPARE - Administrator, Civil Service (Medical): No    Lack of Transportation (Non-Medical): No  Physical Activity: Inactive (02/08/2022)   Exercise Vital Sign    Days of Exercise per Week: 0 days    Minutes of Exercise per Session: 0 min  Stress: No Stress Concern Present (02/08/2022)   [name] of Occupational Health - Occupational Stress Questionnaire     Feeling of Stress : Not at all  Social Connections: Not on file   Past Surgical History:  Procedure Laterality Date   ABDOMINAL HYSTERECTOMY     with left salpingooophorectomy   BIOPSY  04/30/2022   Procedure: BIOPSY;  Surgeon: Sherrilyn Rist, MD;  Location: [facility];  Service: Gastroenterology;;   BREAST LUMPECTOMY  2009/2012   LEFT/RIGHT with lymph node dissection   COLONOSCOPY WITH PROPOFOL N/A 04/30/2022   Procedure: COLONOSCOPY WITH PROPOFOL;  Surgeon: Sherrilyn Rist, MD;  Location: [facility];  Service: Gastroenterology;  Laterality: N/A;   ESOPHAGOGASTRODUODENOSCOPY (EGD) WITH PROPOFOL N/A 04/30/2022   Procedure: ESOPHAGOGASTRODUODENOSCOPY (EGD) WITH PROPOFOL;  Surgeon: Sherrilyn Rist, MD;  Location: [facility];  Service: Gastroenterology;  Laterality: N/A;   HOT HEMOSTASIS N/A 04/30/2022   Procedure: HOT HEMOSTASIS (ARGON PLASMA COAGULATION/BICAP);  Surgeon: Sherrilyn Rist, MD;  Location: [facility];  Service: Gastroenterology;  Laterality: N/A;   KNEE ARTHROSCOPY     right   OOPHORECTOMY  2013   Past Surgical History:  Procedure Laterality Date   ABDOMINAL HYSTERECTOMY     with left salpingooophorectomy   BIOPSY  04/30/2022   Procedure: BIOPSY;  Surgeon: Sherrilyn Rist, MD;  Location: [facility];  Service: Gastroenterology;;   BREAST LUMPECTOMY  2009/2012   LEFT/RIGHT with lymph node dissection   COLONOSCOPY WITH PROPOFOL N/A 04/30/2022   Procedure: COLONOSCOPY WITH PROPOFOL;  Surgeon: Sherrilyn Rist, MD;  Location: [facility];  Service: Gastroenterology;  Laterality: N/A;   ESOPHAGOGASTRODUODENOSCOPY (EGD) WITH PROPOFOL N/A 04/30/2022   Procedure: ESOPHAGOGASTRODUODENOSCOPY (EGD) WITH PROPOFOL;  Surgeon: Sherrilyn Rist, MD;  Location: [facility];  Service: Gastroenterology;  Laterality: N/A;   HOT HEMOSTASIS N/A 04/30/2022   Procedure: HOT HEMOSTASIS (ARGON PLASMA COAGULATION/BICAP);  Surgeon: Sherrilyn Rist, MD;  Location: [facility];   Service: Gastroenterology;  Laterality: N/A;   KNEE ARTHROSCOPY     right   OOPHORECTOMY  2013   Past Medical History:  Diagnosis Date   Abdominal cramps    Anxiety    Arthritis    breast cancer    s/p radiation- last dose 6/12//has been off Tamoxifen 8/12   Chronic headaches    Costochondritis    Fatigue    Fibromyalgia 03/06/2013   GERD (gastroesophageal reflux disease)    History of COVID-19    Hypertension    Lymphedema    RIGHT ARM-////  STATES USE LEFT ARM FOR BP'S   Migraines    Muscle spasms of head and/or neck    following to the breast   Passed out    4th of july   Personal history of radiation therapy 2010   lt breast    S/P radiation  therapy 08/19/07 - 10/10/07   Left Breast/5040 cGy/28 fractions with Boost for a toatl dose of 6300 dGy   S/P radiation therapy 08/14/10 -10/03/10   right breast   Sleep apnea    STOP BANG SCORE 5   BP (!) 154/93   Pulse 96   Ht 5\' 2"  (1.575 m)   Wt 293 lb (132.9 kg)   SpO2 94%   BMI 53.59 kg/m   Opioid Risk Score:   Fall Risk Score:  `1  Depression screen [facility] 2/9     12/31/2022   11:39 AM 10/02/2022    1:47 PM 02/08/2022   10:11 AM 01/16/2022    9:14 AM 12/19/2021   10:39 AM 07/06/2021   10:17 AM 02/02/2021    9:45 AM  Depression screen PHQ 2/9  Decreased Interest 2 1 0 0 0 1 1  Down, Depressed, Hopeless 0 1 0 0 0 1 1  PHQ - 2 Score 2 2 0 0 0 2 2  Altered sleeping 3        Tired, decreased energy 3        Change in appetite 3        Feeling bad or failure about yourself  0        Trouble concentrating 2        Moving slowly or fidgety/restless 0        Suicidal thoughts 0        PHQ-9 Score 13        Difficult doing work/chores Somewhat difficult           Review of Systems  Musculoskeletal:  Positive for neck pain.       B/L hips knee finger tip pain RT shoulder/arm  All other systems reviewed and are negative.      Objective:   Physical Exam Gen: no distress, normal appearing HEENT: oral mucosa pink  and moist, NCAT Cardio: Reg rate Chest: normal effort, normal rate of breathing Abd: soft, non-distended Ext: no edema Psych: pleasant, normal affect Skin: intact Neuro: Alert and oriented x3 MSK; multiple trigger points     Assessment & Plan:   1) LUE lymphedema -will give script for garment -commended her on doing her lymphedema exercises -continue tiger balm  2) Fibromyalgia -increase gabapentin to 600mg  TID, provided refill -discussed her family stressors -continue tylenol -continue gabapentin, discussed can double of dose for severe flares.  -Discussed current symptoms of pain and history of pain.  -Discussed benefits of exercise in reducing pain. -Discussed following foods that may reduce pain: 1) Ginger (especially studied for arthritis)- reduce leukotriene production to decrease inflammation 2) Blueberries- high in phytonutrients that decrease inflammation 3) Salmon- marine omega-3s reduce joint swelling and pain 4) Pumpkin seeds- reduce inflammation 5) dark chocolate- reduces inflammation 6) turmeric- reduces inflammation 7) tart cherries - reduce pain and stiffness 8) extra virgin olive oil - its compound olecanthal helps to block prostaglandins  9) chili peppers- can be eaten or applied topically via capsaicin 10) mint- helpful for headache, muscle aches, joint pain, and itching 11) garlic- reduces inflammation  Link to further information on diet for chronic pain: [email or web address]   3) Right 5th digit DIP OA: -XR ordered  4) Bilateral knee pain -RF and ANA ordered.   5) Obesity: -discussed that stress can cause weight loss -continue topamax 50mg  HS -continue Rybelsius, increase to 14mg   -prescribed oral semglutide for weight loss -Educated that current weight is 274 lbs and current BMI is 53.59 Discussed  following criteria for Wegovy: 1) Patient has diagnosis of obesity; 2)  Patient must be 10 years of age or older; 3) The patient has been involved in a physician or dietitian monitored weight loss program, consisting of both low-calorie diet, increased physical activity and behavioral counseling for a minimum of 6 months without a 3% loss from baseline; 4) Patients BMI is one of the following: a) 30kg/m or greater; b) 27 29.99 kg/m in the presence of at least one weight related comorbidity such as coronary heart disease, dyslipidemia, hypertension, symptomatic arthritis of the lower extremities, type 2 diabetes mellitus, obstructive sleep apnea; c) 25-29.9 kg/m2 and waist circumference is &gt; 40 inches for males or &gt; 35 inches for females;5) Not Met - Must have tried and failed at least one preferred oral agent such as Phentermine; 6) Patient has no labeled contraindication; 7) Patient is not taking another weight loss medication; 8) The dose is within approved product labeling guidelines; 10) The patient must not be taking another GLP-1 receptor agonist AND the patient must not be taking insulin concurrently.  -prescribed YQMVHQ but discussed that this was denied -referred to nutrition -recommended resistance training and prioritizing protein intake to avoid muscle loss while taking this medication -discussed risks of thyroid cancer and gastroparesis  -Educated regarding health benefits of weight loss- for pain, general health, chronic disease prevention, immune health, mental health.  -Will monitor weight every visit.  -Consider Roobois tea daily.  -Discussed the benefits of intermittent fasting. -Discussed foods that can assist in weight loss: 1) leafy greens- high in fiber and nutrients 2) dark chocolate- improves metabolism (if prefer sweetened, best to sweeten with honey instead of sugar).  3) cruciferous vegetables- high in fiber and protein 4) full fat yogurt: high in healthy fat, protein, calcium, and probiotics 5) apples- high in a variety of  phytochemicals 6) nuts- high in fiber and protein that increase feelings of fullness 7) grapefruit: rich in nutrients, antioxidants, and fiber (not to be taken with anticoagulation) 8) beans- high in protein and fiber 9) salmon- has high quality protein and healthy fats 10) green tea- rich in polyphenols 11) eggs- rich in choline and vitamin D 12) tuna- high protein, boosts metabolism 13) avocado- decreases visceral abdominal fat 14) chicken (pasture raised): high in protein and iron 15) blueberries- reduce abdominal fat and cholesterol 16) whole grains- decreases calories retained during digestion, speeds metabolism 17) chia seeds- curb appetite 18) chilies- increases fat metabolism  -Discussed supplements that can be used:  1) Metatrim 400mg  BID 30 minutes before breakfast and dinner  2) Sphaeranthus indicus and Garcinia mangostana (combinations of these and #1 can be found in capsicum and zychrome  3) green coffee bean extract 400mg  twice per day or Irvingia (african mango) 150 to 300mg  twice per day.  6) Iron deficiency anemia: -discussed that when she supplemented she felt improved energy.   7) Radicular lumbar pain: -lumbar XR ordered -increase topamax HS to 50mg   8) Headaches: -discussed that topamax is usually beneficial for headaches -discussed home sleep apnea testing  9) depression: -discussed that it embarrassed her to have to give up her job -discussed that this continues -discussed that she has to make time for herself -discussed that her mom needs time from her and she may have mild cognitive impairment -discussed that on Sunday she didn't even come out of her room -discussed ketogenic diet  10) Knee pain: -discussed increased pain after viscosupplementation -discussed ketogenic diet -tried to get [facility] approved but it was  denied  59) Stress due to illness of family members: -discussed how these shook her  73) Caregiver burnout: -discussed that she still  takes care of her mom full time  57) History of pneumonia: -continue vitamin D supplement, Zinc, vitamin C

## 2023-01-15 NOTE — Transitions of Care (Post Inpatient/ED Visit) (Signed)
01/15/2023  Name: Ashley Lindsey MRN: [id number] DOB: [birth year]  Today's TOC FU Call Status: Today's TOC FU Call Status:: Successful TOC FU Call Completed TOC FU Call Complete Date: 01/15/23 Patient's Name and Date of Birth confirmed.  Transition Care Management Follow-up Telephone Call Date of Discharge: 01/11/23 Discharge Facility: Other ([facility]) Name of Other ([facility]) Discharge Facility: urgent care Type of Discharge: Inpatient Admission Primary Inpatient Discharge Diagnosis:: Community acquired pneumonia of right lower lobe of lung How have you been since you were released from the hospital?: Better Any questions or concerns?: No  Items Reviewed: Did you receive and understand the discharge instructions provided?: Yes Medications obtained,verified, and reconciled?: Yes (Medications Reviewed) Any new allergies since your discharge?: No Dietary orders reviewed?: No Do you have support at home?: Yes People in Home: friend(s)  Medications Reviewed Today: Medications Reviewed Today     Reviewed by Marlyn Corporal, CMA (Certified Medical Assistant) on 01/15/23 at 1649  Med List Status: <None>   Medication Order Taking? Sig Documenting Provider Last Dose Status Informant  acetaminophen (TYLENOL) 650 MG CR tablet [id number] Yes Take 650 mg by mouth every 8 (eight) hours as needed for pain. [provider] Taking Active Self  albuterol (VENTOLIN HFA) 108 (90 Base) MCG/ACT inhaler [id number] Yes Inhale 2 puffs into the lungs every 4 (four) hours as needed for wheezing or shortness of breath. Zenia Resides, MD Taking Active   atorvastatin (LIPITOR) 10 MG tablet [id number] Yes TAKE 1 TABLET BY MOUTH EVERY DAY AT Abagail Kitchens, FNP Taking Active   benzonatate (TESSALON) 100 MG capsule [id number] Yes Take 1 capsule (100 mg total) by mouth 3 (three) times daily as needed for cough. Zenia Resides, MD Taking Active   Bisacodyl (DULCOLAX PO) [id number] Yes  Take 1 capsule by mouth daily as needed (constipation).  [provider] Taking Active Self  cholecalciferol 5000 units TABS [id number] Yes Take 1 tablet (5,000 Units total) by mouth 2 (two) times daily.  Patient taking differently: Take 5,000 Units by mouth daily.   Serena Croissant, MD Taking Active   cyclobenzaprine (FLEXERIL) 10 MG tablet [id number] Yes Take 1 tablet (10 mg total) by mouth as needed. [name], FNP Taking Active   dexlansoprazole (DEXILANT) 60 MG capsule [id number] Yes TAKE 1 CAPSULE BY MOUTH EVERY DAY [name], FNP Taking Active   diclofenac Sodium (VOLTAREN ARTHRITIS PAIN) 1 % GEL [id number] Yes Apply 2 g topically 4 (four) times daily. Persons, [place], [place] Taking Active   diphenhydrAMINE (BENADRYL) 25 MG tablet [id number] Yes Take 1 tablet (25 mg total) by mouth every 6 (six) hours as needed for up to 20 doses (migraine). Take with reglan for migraine headache Trifan, Kermit Balo, MD Taking Active   doxycycline (VIBRAMYCIN) 100 MG capsule [id number] Yes Take 1 capsule (100 mg total) by mouth 2 (two) times daily for 7 days. Zenia Resides, MD Taking Active   ELDERBERRY PO [id number] Yes Take 1 tablet by mouth daily. [provider] Taking Active   ENTRESTO 24-26 [place] [id number] Yes TAKE 1 TABLET BY MOUTH TWICE A DAY Tolia, Sunit, DO Taking Active   ferrous sulfate 324 (65 Fe) MG TBEC [id number] Yes Take 325 mg by mouth 2 (two) times daily. [provider] Taking Active   fish oil-omega-3 fatty acids 1000 MG capsule [id number] Yes Take 1 g by mouth daily. [provider] Taking Active Self  Flaxseed, Linseed, (FLAXSEED OIL MAX STR PO) [id number] Yes Take 1 capsule by  mouth daily.  [provider] Taking Active Self  gabapentin (NEURONTIN) 600 MG tablet [id number] Yes Take 1 tablet (600 mg total) by mouth 4 (four) times daily as needed. Horton Chin, MD Taking Active   meloxicam (MOBIC) 7.5 MG tablet [id number] Yes Take 1 tablet (7.5 mg  total) by mouth daily. [name], FNP Taking Active   Multiple Vitamins-Minerals (MULTIVITAMIN & MINERAL PO) [id number] Yes Take 1 tablet by mouth daily. [provider] Taking Active Self  mupirocin ointment (BACTROBAN) 2 % [id number] Yes Apply 1 Application topically 2 (two) times daily.  Patient taking differently: Apply 1 Application topically as needed.   Ervin Knack E, [place] Taking Active   ondansetron (ZOFRAN) 4 MG tablet [id number] Yes TAKE 1 TABLET BY MOUTH DAILY AS NEEDED FOR NAUSEA OR VOMITING [name], FNP Taking Active   OVER THE COUNTER MEDICATION [id number] Yes Take 125 mg by mouth daily. [place]/Simethicone [provider] Taking Active   polyethylene glycol (MIRALAX / GLYCOLAX) 17 g packet [id number] Yes Take 17 g by mouth daily as needed. [provider] Taking Active   predniSONE (DELTASONE) 20 MG tablet [id number] Yes Take 2 tablets (40 mg total) by mouth daily with breakfast for 5 days. Zenia Resides, MD Taking Active   propranolol (INDERAL) 10 MG tablet [id number] Yes TAKE 1 TABLET BY MOUTH TWICE A DAY *HOLD IF SYSTOLIC BP (TOP #) IS <100MMHG OR PULSE IS <60BPM  Patient taking differently: Take 10 mg by mouth at bedtime.   Odis Hollingshead, Sunit, DO Taking Active   Semaglutide (RYBELSUS) 14 MG TABS [id number] Yes Take 1 tablet (14 mg total) by mouth daily. Horton Chin, MD Taking Active   spironolactone (ALDACTONE) 25 MG tablet [id number] Yes TAKE 1 TABLET BY MOUTH EVERY DAY IN THE MORNING Tolia, Sunit, DO Taking Active   SYMBICORT 80-4.5 MCG/ACT inhaler [id number] Yes INHALE 2 PUFFS INTO THE LUNGS IN THE MORNING AND AT BEDTIME. [name], FNP Taking Active   topiramate (TOPAMAX) 50 MG tablet [id number] Yes Take 1 tablet (50 mg total) by mouth at bedtime. Horton Chin, MD Taking Active   triamcinolone cream (KENALOG) 0.1 % [id number] Yes APPLY TO AFFECTED AREA (RASH) TWICE A DAY AS NEEDED [name], FNP Taking Active   Turmeric 500 MG CAPS  [id number] Yes Take by mouth. [provider] Taking Active   venlafaxine XR (EFFEXOR-XR) 75 MG 24 hr capsule [id number] Yes Take 1 capsule (75 mg total) by mouth in the morning and at bedtime. Serena Croissant, MD Taking Active   Vitamin D, Ergocalciferol, (DRISDOL) 1.25 MG (50000 UNIT) CAPS capsule [id number] Yes TAKE 1 CAPSULE BY MOUTH EVERY 7 DAYS Raulkar, Drema Pry, MD Taking Active   vitamin E 200 UNIT capsule [id number] Yes Take 200 Units by mouth daily. [provider] Taking Active Self            Home Care and Equipment/Supplies: Were Home Health Services Ordered?: No Any new equipment or medical supplies ordered?: No  Functional Questionnaire: Do you need assistance with bathing/showering or dressing?: No Do you need assistance with meal preparation?: No Do you need assistance with eating?: No Do you have difficulty maintaining continence: No Do you need assistance with getting out of bed/getting out of a chair/moving?: No Do you have difficulty managing or taking your medications?: No  Follow up appointments reviewed: PCP Follow-up appointment confirmed?: No MD Provider Line [phone] Given: Yes Specialist Hospital Follow-up appointment confirmed?: No Do you need transportation to your follow-up appointment?:  No Do you understand care options if your condition(s) worsen?: Yes-patient verbalized understanding    SIGNATURE Lisabeth Devoid, CMA

## 2023-01-18 ENCOUNTER — Telehealth (HOSPITAL_COMMUNITY): Payer: Self-pay | Admitting: *Deleted

## 2023-01-18 NOTE — Telephone Encounter (Signed)
 Attempted to call patient regarding upcoming cardiac MRI appointment. Left message on voicemail with name and callback number Johney Frame RN Navigator Cardiac Imaging [facility] [phone] Office

## 2023-01-18 NOTE — Telephone Encounter (Signed)
 Reaching out to patient to offer assistance regarding upcoming cardiac imaging study; pt verbalizes understanding of appt date/time, parking situation and where to check in, pre-test NPO status and medications ordered, and verified current allergies; name and call back number provided for further questions should they arise Ashley Frame RN Navigator Cardiac Imaging [facility] [phone] office [phone] cell   Patient denies metal or claustrophobia.

## 2023-01-21 ENCOUNTER — Other Ambulatory Visit: Payer: Self-pay | Admitting: Cardiology

## 2023-01-21 ENCOUNTER — Ambulatory Visit (HOSPITAL_COMMUNITY)
Admission: RE | Admit: 2023-01-21 | Discharge: 2023-01-21 | Disposition: A | Discharge status: Home / Self Care | Payer: 59 | Source: Ambulatory Visit | Attending: Cardiology | Admitting: Cardiology

## 2023-01-21 DIAGNOSIS — I429 Cardiomyopathy, unspecified: Secondary | ICD-10-CM

## 2023-01-21 MED ORDER — GADOBUTROL 1 MMOL/ML IV SOLN
10.0000 mL | Freq: Once | INTRAVENOUS | Status: AC | PRN
  Administered 2023-01-21: 10 mL via INTRAVENOUS

## 2023-01-22 DIAGNOSIS — G4733 Obstructive sleep apnea (adult) (pediatric): Secondary | ICD-10-CM | POA: Diagnosis not present

## 2023-01-27 ENCOUNTER — Other Ambulatory Visit: Payer: Self-pay | Admitting: Cardiology

## 2023-01-27 DIAGNOSIS — I429 Cardiomyopathy, unspecified: Secondary | ICD-10-CM

## 2023-01-28 ENCOUNTER — Other Ambulatory Visit: Payer: Self-pay | Admitting: Nurse Practitioner

## 2023-01-28 DIAGNOSIS — M797 Fibromyalgia: Secondary | ICD-10-CM

## 2023-01-29 ENCOUNTER — Encounter: Payer: Self-pay | Admitting: Physical Medicine and Rehabilitation

## 2023-01-31 ENCOUNTER — Encounter: Payer: 59 | Attending: Physical Medicine and Rehabilitation | Admitting: Physical Medicine and Rehabilitation

## 2023-01-31 ENCOUNTER — Telehealth: Payer: Self-pay

## 2023-01-31 DIAGNOSIS — G8929 Other chronic pain: Secondary | ICD-10-CM | POA: Insufficient documentation

## 2023-01-31 DIAGNOSIS — M25561 Pain in right knee: Secondary | ICD-10-CM | POA: Diagnosis not present

## 2023-01-31 DIAGNOSIS — M25562 Pain in left knee: Secondary | ICD-10-CM | POA: Diagnosis not present

## 2023-01-31 DIAGNOSIS — M545 Low back pain, unspecified: Secondary | ICD-10-CM

## 2023-01-31 DIAGNOSIS — M25552 Pain in left hip: Secondary | ICD-10-CM

## 2023-01-31 DIAGNOSIS — M170 Bilateral primary osteoarthritis of knee: Secondary | ICD-10-CM

## 2023-01-31 DIAGNOSIS — M79601 Pain in right arm: Secondary | ICD-10-CM | POA: Diagnosis not present

## 2023-01-31 NOTE — Telephone Encounter (Signed)
Spoke with patient and she is aware of lab results.  Verbalized understanding.

## 2023-01-31 NOTE — Progress Notes (Addendum)
Subjective:    Patient ID: Ashley Lindsey, female    DOB: Sep 05, 1960, 62 y.o.   MRN: [id number]  HPI An audio/video tele-health visit is felt to be the most appropriate encounter for this patient at this time. This is a follow up tele-visit via phone. The patient is at home. MD is at office. Prior to scheduling this appointment, our staff discussed the limitations of evaluation and management by telemedicine and the availability of in-person appointments. The patient expressed understanding and agreed to proceed.   Ashley Lindsey is a 62 year old woman who presents for f/u of lymphedema, lower extremity pain, and fibromyalgia  1) LUE lymphedema -garment was too expensive -when it flares it gives her no warning -she received a booklet and she does the therapy and sometimes it works and sometimes it doesn't -worse in the biceps -aches and wakes her up  2) Fibromyalgia -ran out of her gabapentin and she really felt this -working out in [employer] and [employer] -up and down with emotions -had to put her uncle in a nursing home and this circles her -she is cleaning out her uncle's room -she has had more flares recently.   3) Lower extremity pain -she sometimes feels that it radiates from her back -has not had any recent spinal imaging -she would be interested in increasing her dose of topamax  4) Obesity -she asks for alternatives to wegivy  5) Headaches: -taking tylenol but is wary of taking too much  6) Knee pain: -got viscosupplementation -has been in excruciating pain ever since  7) Stress due to illness of family members: -she lost two family members recently  8) Caregiver burnout: -she is still taking care of her mom full time  21) Recent pneumonia: -discussed recent pneumonia  10) right arm pain -she is not sure if this is lymphedema or fibromyalgia  11) Bilateral knee pain: -getting worse -she was in severe pain on Monday after she had a lot of activity on  Sunday   12) Joint pain in both feet  Pain Inventory Average Pain 9 Pain Right Now 9 My pain is constant, dull, and aching  In the last 24 hours, has pain interfered with the following? General activity 9 Relation with others 9 Enjoyment of life 9 What TIME of day is your pain at its worst? varies Sleep (in general) Fair  Pain is worse with: walking, sitting, inactivity, standing, and some activites Pain improves with: rest, heat/ice, therapy/exercise, pacing activities, medication, and injections Relief from Meds: 9  Family History  Problem Relation Age of Onset   Breast cancer Mother 45   Heart disease Father    Diabetes Sister    Prostate cancer Maternal Grandfather 65   Cancer Maternal Aunt 63       d. from "female cancer" possibly cervical   Breast cancer Maternal Aunt 60   Prostate cancer Maternal Uncle 78   Prostate cancer Maternal Uncle 80   Prostate cancer Paternal Uncle    Prostate cancer Paternal Uncle    Breast cancer Cousin 30       mat 1st cousin   Social History   Socioeconomic History   Marital status: Divorced    Spouse name: Not on file   Number of children: 1   Years of education: Not on file   Highest education level: Not on file  Occupational History   Occupation: disability  Tobacco Use   Smoking status: Former    Current packs/day: 0.25  Average packs/day: 0.3 packs/day for 1 year (0.3 ttl pk-yrs)    Types: Cigarettes   Smokeless tobacco: Never  Vaping Use   Vaping status: Never Used  Substance and Sexual Activity   Alcohol use: Not Currently   Drug use: No   Sexual activity: Not Currently    Birth control/protection: Surgical  Other Topics Concern   Not on file  Social History Narrative   Right handed    Coffee daily, sometimes Ginger ale    Social Determinants of Health   Financial Resource Strain: Low Risk  (02/08/2022)   Overall Financial Resource Strain (CARDIA)    Difficulty of Paying Living Expenses: Not hard at all   Food Insecurity: No Food Insecurity (02/08/2022)   Hunger Vital Sign    Worried About Running Out of Food in the Last Year: Never true    Ran Out of Food in the Last Year: Never true  Transportation Needs: No Transportation Needs (02/08/2022)   PRAPARE - Administrator, Civil Service (Medical): No    Lack of Transportation (Non-Medical): No  Physical Activity: Inactive (02/08/2022)   Exercise Vital Sign    Days of Exercise per Week: 0 days    Minutes of Exercise per Session: 0 min  Stress: No Stress Concern Present (02/08/2022)   [name] of Occupational Health - Occupational Stress Questionnaire    Feeling of Stress : Not at all  Social Connections: Not on file   Past Surgical History:  Procedure Laterality Date   ABDOMINAL HYSTERECTOMY     with left salpingooophorectomy   BIOPSY  04/30/2022   Procedure: BIOPSY;  Surgeon: Sherrilyn Rist, MD;  Location: [facility];  Service: Gastroenterology;;   BREAST LUMPECTOMY  2009/2012   LEFT/RIGHT with lymph node dissection   COLONOSCOPY WITH PROPOFOL N/A 04/30/2022   Procedure: COLONOSCOPY WITH PROPOFOL;  Surgeon: Sherrilyn Rist, MD;  Location: [facility];  Service: Gastroenterology;  Laterality: N/A;   ESOPHAGOGASTRODUODENOSCOPY (EGD) WITH PROPOFOL N/A 04/30/2022   Procedure: ESOPHAGOGASTRODUODENOSCOPY (EGD) WITH PROPOFOL;  Surgeon: Sherrilyn Rist, MD;  Location: [facility];  Service: Gastroenterology;  Laterality: N/A;   HOT HEMOSTASIS N/A 04/30/2022   Procedure: HOT HEMOSTASIS (ARGON PLASMA COAGULATION/BICAP);  Surgeon: Sherrilyn Rist, MD;  Location: [facility];  Service: Gastroenterology;  Laterality: N/A;   KNEE ARTHROSCOPY     right   OOPHORECTOMY  2013   Past Surgical History:  Procedure Laterality Date   ABDOMINAL HYSTERECTOMY     with left salpingooophorectomy   BIOPSY  04/30/2022   Procedure: BIOPSY;  Surgeon: Sherrilyn Rist, MD;  Location: [facility];  Service: Gastroenterology;;    BREAST LUMPECTOMY  2009/2012   LEFT/RIGHT with lymph node dissection   COLONOSCOPY WITH PROPOFOL N/A 04/30/2022   Procedure: COLONOSCOPY WITH PROPOFOL;  Surgeon: Sherrilyn Rist, MD;  Location: [facility];  Service: Gastroenterology;  Laterality: N/A;   ESOPHAGOGASTRODUODENOSCOPY (EGD) WITH PROPOFOL N/A 04/30/2022   Procedure: ESOPHAGOGASTRODUODENOSCOPY (EGD) WITH PROPOFOL;  Surgeon: Sherrilyn Rist, MD;  Location: [facility];  Service: Gastroenterology;  Laterality: N/A;   HOT HEMOSTASIS N/A 04/30/2022   Procedure: HOT HEMOSTASIS (ARGON PLASMA COAGULATION/BICAP);  Surgeon: Sherrilyn Rist, MD;  Location: [facility];  Service: Gastroenterology;  Laterality: N/A;   KNEE ARTHROSCOPY     right   OOPHORECTOMY  2013   Past Medical History:  Diagnosis Date   Abdominal cramps    Anxiety    Arthritis  breast cancer    s/p radiation- last dose 6/12//has been off Tamoxifen 8/12   Chronic headaches    Costochondritis    Fatigue    Fibromyalgia 03/06/2013   GERD (gastroesophageal reflux disease)    History of COVID-19    Hypertension    Lymphedema    RIGHT ARM-////  STATES USE LEFT ARM FOR BP'S   Migraines    Muscle spasms of head and/or neck    following to the breast   Passed out    4th of july   Personal history of radiation therapy 2010   lt breast    S/P radiation therapy 08/19/07 - 10/10/07   Left Breast/5040 cGy/28 fractions with Boost for a toatl dose of 6300 dGy   S/P radiation therapy 08/14/10 -10/03/10   right breast   Sleep apnea    STOP BANG SCORE 5   There were no vitals taken for this visit.  Opioid Risk Score:   Fall Risk Score:  `1  Depression screen [facility] 2/9     12/31/2022   11:39 AM 10/02/2022    1:47 PM 02/08/2022   10:11 AM 01/16/2022    9:14 AM 12/19/2021   10:39 AM 07/06/2021   10:17 AM 02/02/2021    9:45 AM  Depression screen PHQ 2/9  Decreased Interest 2 1 0 0 0 1 1  Down, Depressed, Hopeless 0 1 0 0 0 1 1  PHQ - 2 Score 2 2 0 0 0 2 2   Altered sleeping 3        Tired, decreased energy 3        Change in appetite 3        Feeling bad or failure about yourself  0        Trouble concentrating 2        Moving slowly or fidgety/restless 0        Suicidal thoughts 0        PHQ-9 Score 13        Difficult doing work/chores Somewhat difficult           Review of Systems  Musculoskeletal:  Positive for neck pain.       B/L hips knee finger tip pain RT shoulder/arm  All other systems reviewed and are negative.      Objective:   Physical Exam Gen: no distress, normal appearing HEENT: oral mucosa pink and moist, NCAT Cardio: Reg rate Chest: normal effort, normal rate of breathing Abd: soft, non-distended Ext: no edema Psych: pleasant, normal affect Skin: intact Neuro: Alert and oriented x3 MSK; multiple trigger points     Assessment & Plan:   1) LUE lymphedema -will give script for garment -commended her on doing her lymphedema exercises -continue tiger balm  2) Fibromyalgia -increase gabapentin to 600mg  TID, provided refill -discussed her family stressors -continue tylenol -continue gabapentin, discussed can double of dose for severe flares.  -Discussed current symptoms of pain and history of pain.  -Discussed benefits of exercise in reducing pain. -Discussed following foods that may reduce pain: 1) Ginger (especially studied for arthritis)- reduce leukotriene production to decrease inflammation 2) Blueberries- high in phytonutrients that decrease inflammation 3) Salmon- marine omega-3s reduce joint swelling and pain 4) Pumpkin seeds- reduce inflammation 5) dark chocolate- reduces inflammation 6) turmeric- reduces inflammation 7) tart cherries - reduce pain and stiffness 8) extra virgin olive oil - its compound olecanthal helps to block prostaglandins  9) chili peppers- can be eaten or applied topically via  capsaicin 10) mint- helpful for headache, muscle aches, joint pain, and itching 11) garlic-  reduces inflammation  Link to further information on diet for chronic pain: [email or web address]   3) Right 5th digit DIP OA: -XR ordered  4) Bilateral knee pain -RF and ANA ordered.   5) Obesity: -discussed that stress can cause weight loss -continue topamax 50mg  HS -continue Rybelsius, increase to 14mg   -prescribed oral semglutide for weight loss -Educated that current weight is 274 lbs and current BMI is 53.59 Discussed following criteria for Wegovy: 1) Patient has diagnosis of obesity; 2) Patient must be 66 years of age or older; 3) The patient has been involved in a physician or dietitian monitored weight loss program, consisting of both low-calorie diet, increased physical activity and behavioral counseling for a minimum of 6 months without a 3% loss from baseline; 4) Patients BMI is one of the following: a) 30kg/m or greater; b) 27 29.99 kg/m in the presence of at least one weight related comorbidity such as coronary heart disease, dyslipidemia, hypertension, symptomatic arthritis of the lower extremities, type 2 diabetes mellitus, obstructive sleep apnea; c) 25-29.9 kg/m2 and waist circumference is &gt; 40 inches for males or &gt; 35 inches for females;5) Not Met - Must have tried and failed at least one preferred oral agent such as Phentermine; 6) Patient has no labeled contraindication; 7) Patient is not taking another weight loss medication; 8) The dose is within approved product labeling guidelines; 10) The patient must not be taking another GLP-1 receptor agonist AND the patient must not be taking insulin concurrently.  -prescribed ZOXWRU but discussed that this was denied -referred to nutrition -recommended resistance training and prioritizing protein intake to avoid muscle loss while taking this medication -discussed risks of thyroid cancer and gastroparesis  -Educated regarding health benefits of weight loss-  for pain, general health, chronic disease prevention, immune health, mental health.  -Will monitor weight every visit.  -Consider Roobois tea daily.  -Discussed the benefits of intermittent fasting. -Discussed foods that can assist in weight loss: 1) leafy greens- high in fiber and nutrients 2) dark chocolate- improves metabolism (if prefer sweetened, best to sweeten with honey instead of sugar).  3) cruciferous vegetables- high in fiber and protein 4) full fat yogurt: high in healthy fat, protein, calcium, and probiotics 5) apples- high in a variety of phytochemicals 6) nuts- high in fiber and protein that increase feelings of fullness 7) grapefruit: rich in nutrients, antioxidants, and fiber (not to be taken with anticoagulation) 8) beans- high in protein and fiber 9) salmon- has high quality protein and healthy fats 10) green tea- rich in polyphenols 11) eggs- rich in choline and vitamin D 12) tuna- high protein, boosts metabolism 13) avocado- decreases visceral abdominal fat 14) chicken (pasture raised): high in protein and iron 15) blueberries- reduce abdominal fat and cholesterol 16) whole grains- decreases calories retained during digestion, speeds metabolism 17) chia seeds- curb appetite 18) chilies- increases fat metabolism  -Discussed supplements that can be used:  1) Metatrim 400mg  BID 30 minutes before breakfast and dinner  2) Sphaeranthus indicus and Garcinia mangostana (combinations of these and #1 can be found in capsicum and zychrome  3) green coffee bean extract 400mg  twice per day or Irvingia (african mango) 150 to 300mg  twice per day.  6) Iron deficiency anemia: -discussed that when she supplemented she felt improved energy.   7) Radicular lumbar pain: -lumbar XR ordered -increase topamax HS to 50mg   8) Headaches: -discussed that topamax is usually  beneficial for headaches -discussed home sleep apnea testing  9) depression: -discussed that it embarrassed her  to have to give up her job -discussed that this continues -discussed that she has to make time for herself -discussed that her mom needs time from her and she may have mild cognitive impairment -discussed that on Sunday she didn't even come out of her room -discussed ketogenic diet  10) Knee pain: -discussed increased pain after viscosupplementation -discussed ketogenic diet -tried to get [facility] approved but it was denied -discussed that pain has been severe  11) Stress due to illness of family members: -discussed how these shook her  32) Caregiver burnout: -discussed that she still takes care of her mom full time  80) History of pneumonia: -continue vitamin D supplement, Zinc, vitamin C  14) Joint pain in both feet: -discussed that started after she had a lot of activity on Sunday but there are flareups are getting more aggressive than before.  -food allergy testing ordered -recommended natto or nattokinase  15) right arm pain:  Discussed symptoms could be 2/2 fibromyalgia  16) Lower back pain/left hip pain: -Xrs ordered  16 minutes spent in discussion of her pain in both feet, her right arm pain, recommended natto or nattokinase for her joint pain, Xrs ordered for her lower back and left hip pain, discussed that fibromyalgia and rheumatoid arthritis are overlapping syndromes, discussed that the aching in her fingers, discussed that she is celebrating her 11th year as a breast cancer survivor

## 2023-01-31 NOTE — Addendum Note (Signed)
Addended by: Horton Chin on: 01/31/2023 10:58 AM   Modules accepted: Orders, Level of Service

## 2023-01-31 NOTE — Telephone Encounter (Signed)
-----   Message from [facility] sent at 01/31/2023  3:28 PM EDT ----- Based on the cardiac MRI left ventricular systolic function is normal.   No findings of cardiomyopathy.  We will review the results at the upcoming office visit.  Regards,   Sunit [place], DO, [facility]

## 2023-02-04 ENCOUNTER — Other Ambulatory Visit: Payer: Self-pay | Admitting: Physical Medicine and Rehabilitation

## 2023-02-04 ENCOUNTER — Telehealth: Payer: Self-pay

## 2023-02-04 NOTE — Telephone Encounter (Signed)
Patient called stating she had an telephone visit last week but she is no better. She is pain and swelling, please advise.

## 2023-02-05 NOTE — Telephone Encounter (Signed)
Notified of topamax increase.

## 2023-02-08 ENCOUNTER — Encounter (HOSPITAL_BASED_OUTPATIENT_CLINIC_OR_DEPARTMENT_OTHER): Payer: 59 | Admitting: Physical Medicine and Rehabilitation

## 2023-02-08 DIAGNOSIS — M25561 Pain in right knee: Secondary | ICD-10-CM

## 2023-02-08 DIAGNOSIS — M25562 Pain in left knee: Secondary | ICD-10-CM

## 2023-02-08 DIAGNOSIS — G8929 Other chronic pain: Secondary | ICD-10-CM

## 2023-02-08 MED ORDER — FUROSEMIDE 20 MG PO TABS: 20.0000 mg | ORAL_TABLET | Freq: Every day | ORAL | 3 refills | Status: DC | PRN

## 2023-02-11 ENCOUNTER — Ambulatory Visit
Admission: EM | Admit: 2023-02-11 | Discharge: 2023-02-11 | Disposition: A | Discharge status: Home / Self Care | Payer: 59 | Attending: Physician Assistant | Admitting: Physician Assistant

## 2023-02-11 ENCOUNTER — Encounter: Payer: Self-pay | Admitting: Emergency Medicine

## 2023-02-11 DIAGNOSIS — J069 Acute upper respiratory infection, unspecified: Secondary | ICD-10-CM | POA: Insufficient documentation

## 2023-02-11 DIAGNOSIS — Z1152 Encounter for screening for COVID-19: Secondary | ICD-10-CM | POA: Diagnosis not present

## 2023-02-11 LAB — POCT INFLUENZA A/B
Influenza A, POC: NEGATIVE
Influenza B, POC: NEGATIVE

## 2023-02-11 MED ORDER — BENZONATATE 100 MG PO CAPS: 100.0000 mg | ORAL_CAPSULE | Freq: Three times a day (TID) | ORAL | 0 refills | Status: DC | PRN

## 2023-02-11 NOTE — ED Provider Notes (Signed)
[facility]    CSN: [id number] Arrival date & time: 02/11/23  0944      History   Chief Complaint Chief Complaint  Patient presents with   Cough    HPI Ashley Lindsey is a 62 y.o. female.   Patient here today for evaluation of nonproductive cough that started 3 days ago.  She reports she has had some headaches some shortness of breath and some nasal drainage as well.  She has taken Benadryl and Tylenol without resolution.  She denies any fever.  The history is provided by the patient.  Cough Associated symptoms: sore throat   Associated symptoms: no chills, no ear pain, no eye discharge, no fever, no shortness of breath and no wheezing     Past Medical History:  Diagnosis Date   Abdominal cramps    Anxiety    Arthritis    breast cancer    s/p radiation- last dose 6/12//has been off Tamoxifen 8/12   Chronic headaches    Costochondritis    Dyspnea 09/21/2014   Fatigue    Fibromyalgia 03/06/2013   GERD (gastroesophageal reflux disease)    History of COVID-19    Hypertension    Lymphedema    RIGHT ARM-////  STATES USE LEFT ARM FOR BP'S   Migraines    Muscle spasms of head and/or neck    following to the breast   Passed out    4th of july   Personal history of radiation therapy 2010   lt breast    S/P radiation therapy 08/19/07 - 10/10/07   Left Breast/5040 cGy/28 fractions with Boost for a toatl dose of 6300 dGy   S/P radiation therapy 08/14/10 -10/03/10   right breast   Sleep apnea    STOP BANG SCORE 5    Patient Active Problem List   Diagnosis Date Noted   Acute cough 02/20/2023   Right sided abdominal pain 02/19/2023   Hematuria 02/19/2023   Abnormal urine odor 12/31/2022   Cervicogenic headache 12/31/2022   Urinary frequency 11/28/2022   Urinary tract infection without hematuria 11/28/2022   Elevated cholesterol 11/20/2022   Vitamin D deficiency 11/20/2022   Tibialis posterior tendinitis 10/08/2022   OSA (obstructive sleep apnea)  09/04/2022   Claustrophobia 09/04/2022   Suboptimal nutrition (HCC) 07/05/2022   Cardiomyopathy, unspecified type (HCC) 07/05/2022   Essential hypertension 07/05/2022   Type 2 diabetes mellitus with diabetic dermatitis, without long-term current use of insulin (HCC) 07/05/2022   Chronic gastric ulcer without hemorrhage and without perforation 04/30/2022   Gastric AVM 04/30/2022   Iron deficiency anemia 03/06/2022   Unilateral primary osteoarthritis, left knee 10/02/2021   Pulmonary nodules 07/12/2021   Chronic cough 07/12/2021   Avulsion fracture of middle phalanx of finger 04/18/2021   Tibial plateau fracture, left 04/18/2021   Asymmetrical sensorineural hearing loss 02/02/2021   Bilateral primary osteoarthritis of knee 02/09/2020   Chronic intermittent hypoxia with obstructive sleep apnea 08/24/2019   Chronic intractable headache 07/14/2019   Morning headache 07/14/2019   Loud snoring 07/14/2019   Super obesity 07/14/2019   Sleeps in sitting position due to orthopnea 07/14/2019   Chest pain due to GERD 07/14/2019   Nocturia more than twice per night 07/14/2019   Unilateral primary osteoarthritis, right knee 05/26/2019   Chronic migraine without aura without status migrainosus, not intractable 05/13/2019   Class 3 severe obesity due to excess calories without serious comorbidity with body mass index (BMI) of 40.0 to 44.9 in adult [facility]) 08/05/2018  Depression 08/05/2018   Chronic nonintractable headache 08/05/2018   Chronic pain of right ankle 04/24/2018   Breast cancer of lower-outer quadrant of right female breast (HCC) 12/02/2014   Bronchospasm 09/21/2014   Chronic pain of right knee 09/21/2014   Fibromyalgia 03/06/2013   Dense breasts 03/06/2013   Mastalgia 03/06/2013   Back pain, lumbosacral 03/06/2013   Lymphedema of arm 03/06/2013   Atypical chest pain 01/06/2013   Breast cancer of upper-inner quadrant of left female breast (HCC) 09/08/2012   Family history of  malignant neoplasm of breast 09/08/2012   S/P radiation therapy    History of breast cancer in female 08/13/2011    Past Surgical History:  Procedure Laterality Date   ABDOMINAL HYSTERECTOMY     with left salpingooophorectomy   BIOPSY  04/30/2022   Procedure: BIOPSY;  Surgeon: Sherrilyn Rist, MD;  Location: [facility];  Service: Gastroenterology;;   BREAST LUMPECTOMY  2009/2012   LEFT/RIGHT with lymph node dissection   COLONOSCOPY WITH PROPOFOL N/A 04/30/2022   Procedure: COLONOSCOPY WITH PROPOFOL;  Surgeon: Sherrilyn Rist, MD;  Location: [facility];  Service: Gastroenterology;  Laterality: N/A;   ESOPHAGOGASTRODUODENOSCOPY (EGD) WITH PROPOFOL N/A 04/30/2022   Procedure: ESOPHAGOGASTRODUODENOSCOPY (EGD) WITH PROPOFOL;  Surgeon: Sherrilyn Rist, MD;  Location: [facility];  Service: Gastroenterology;  Laterality: N/A;   HOT HEMOSTASIS N/A 04/30/2022   Procedure: HOT HEMOSTASIS (ARGON PLASMA COAGULATION/BICAP);  Surgeon: Sherrilyn Rist, MD;  Location: [facility];  Service: Gastroenterology;  Laterality: N/A;   KNEE ARTHROSCOPY     right   OOPHORECTOMY  2013    OB History   No obstetric history on file.      Home Medications    Prior to Admission medications   Medication Sig Start Date End Date Taking? Authorizing Provider  acetaminophen (TYLENOL) 650 MG CR tablet Take 650 mg by mouth every 8 (eight) hours as needed for pain.   Yes [provider]  albuterol (VENTOLIN HFA) 108 (90 Base) MCG/ACT inhaler Inhale 2 puffs into the lungs every 4 (four) hours as needed for wheezing or shortness of breath. 01/11/23  Yes Zenia Resides, MD  atorvastatin (LIPITOR) 10 MG tablet TAKE 1 TABLET BY MOUTH EVERY DAY AT NIGHT 09/24/22  Yes [name], FNP  Bisacodyl (DULCOLAX PO) Take 1 capsule by mouth daily as needed (constipation).    Yes [provider]  cholecalciferol 5000 units TABS Take 1 tablet (5,000 Units total) by mouth 2 (two) times daily. Patient  taking differently: Take 5,000 Units by mouth daily. 07/21/15  Yes Serena Croissant, MD  dexlansoprazole (DEXILANT) 60 MG capsule TAKE 1 CAPSULE BY MOUTH EVERY DAY 12/21/22  Yes [name], FNP  diclofenac Sodium (VOLTAREN ARTHRITIS PAIN) 1 % GEL Apply 2 g topically 4 (four) times daily. 12/14/22  Yes Persons, [place], PA  diphenhydrAMINE (BENADRYL) 25 MG tablet Take 1 tablet (25 mg total) by mouth every 6 (six) hours as needed for up to 20 doses (migraine). Take with reglan for migraine headache 09/11/20  Yes Trifan, Kermit Balo, MD  ELDERBERRY PO Take 1 tablet by mouth daily.   Yes [provider]  ENTRESTO 24-26 MG TAKE 1 TABLET BY MOUTH TWICE A DAY 10/22/22  Yes Tolia, Sunit, DO  ferrous sulfate 324 (65 Fe) MG TBEC Take 325 mg by mouth 2 (two) times daily.   Yes [provider]  fish oil-omega-3 fatty acids 1000 MG capsule Take 1 g by mouth daily.  Yes [provider]  Flaxseed, Linseed, (FLAXSEED OIL MAX STR PO) Take 1 capsule by mouth daily.    Yes [provider]  furosemide (LASIX) 20 MG tablet Take 1 tablet (20 mg total) by mouth daily as needed. 02/08/23  Yes Raulkar, Drema Pry, MD  gabapentin (NEURONTIN) 600 MG tablet Take 1 tablet (600 mg total) by mouth 4 (four) times daily as needed. 10/23/22  Yes Raulkar, Drema Pry, MD  meloxicam (MOBIC) 7.5 MG tablet TAKE 1 TABLET BY MOUTH EVERY DAY 01/29/23  Yes [name], FNP  Multiple Vitamins-Minerals (MULTIVITAMIN & MINERAL PO) Take 1 tablet by mouth daily.   Yes [provider]  mupirocin ointment (BACTROBAN) 2 % Apply 1 Application topically 2 (two) times daily. Patient taking differently: Apply 1 Application topically as needed. 11/29/21  Yes Mound, Rolly Salter E, FNP  ondansetron (ZOFRAN) 4 MG tablet TAKE 1 TABLET BY MOUTH DAILY AS NEEDED FOR NAUSEA OR VOMITING 12/21/22  Yes [name], FNP  OVER THE COUNTER MEDICATION Take 125 mg by mouth daily. [place]/Simethicone   Yes [provider]   polyethylene glycol (MIRALAX / GLYCOLAX) 17 g packet Take 17 g by mouth daily as needed.   Yes [provider]  propranolol (INDERAL) 10 MG tablet TAKE 1 TABLET BY MOUTH TWICE A DAY *HOLD IF SYSTOLIC BP (TOP #) IS <100MMHG OR PULSE IS <60BPM 01/29/23  Yes Tolia, Sunit, DO  Semaglutide (RYBELSUS) 14 MG TABS Take 1 tablet (14 mg total) by mouth daily. 01/15/23  Yes Raulkar, Drema Pry, MD  spironolactone (ALDACTONE) 25 MG tablet TAKE 1 TABLET BY MOUTH EVERY DAY IN THE MORNING 10/22/22  Yes Tolia, Sunit, DO  SYMBICORT 80-4.5 MCG/ACT inhaler INHALE 2 PUFFS INTO THE LUNGS IN THE MORNING AND AT BEDTIME. 09/24/22  Yes [name], FNP  topiramate (TOPAMAX) 50 MG tablet Take 1 tablet (50 mg total) by mouth at bedtime. Patient taking differently: Take 100 mg by mouth at bedtime. 10/15/22  Yes Raulkar, Drema Pry, MD  triamcinolone cream (KENALOG) 0.1 % APPLY TO AFFECTED AREA (RASH) TWICE A DAY AS NEEDED 10/23/22  Yes [name], FNP  Turmeric 500 MG CAPS Take by mouth.   Yes [provider]  venlafaxine XR (EFFEXOR-XR) 75 MG 24 hr capsule Take 1 capsule (75 mg total) by mouth in the morning and at bedtime. 12/17/22  Yes Serena Croissant, MD  vitamin E 200 UNIT capsule Take 200 Units by mouth daily.   Yes [provider]  benzonatate (TESSALON) 100 MG capsule Take 1 capsule (100 mg total) by mouth 3 (three) times daily as needed for cough. 02/11/23   Tomi Bamberger, PA-C  cyclobenzaprine (FLEXERIL) 10 MG tablet Take 1 tablet (10 mg total) by mouth as needed. 02/19/23   [name], FNP  HYDROcodone bit-homatropine (HYDROMET) 5-1.5 MG/5ML syrup Take 5 mLs by mouth every 6 (six) hours as needed for cough. 02/13/23   [name], FNP  nitrofurantoin, macrocrystal-monohydrate, (MACROBID) 100 MG capsule Take 1 capsule (100 mg total) by mouth 2 (two) times daily for 7 days. 02/19/23 02/26/23  [name], FNP  Vitamin D, Ergocalciferol, (DRISDOL) 1.25 MG (50000 UNIT) CAPS capsule Take 1  capsule (50,000 Units total) by mouth every 7 (seven) days. 02/19/23   Raulkar, Drema Pry, MD    Family History Family History  Problem Relation Age of Onset   Breast cancer Mother 18   Heart disease Father    Diabetes Sister    Prostate cancer Maternal Grandfather 67  Cancer Maternal Aunt 63       d. from "female cancer" possibly cervical   Breast cancer Maternal Aunt 60   Prostate cancer Maternal Uncle 78   Prostate cancer Maternal Uncle 80   Prostate cancer Paternal Uncle    Prostate cancer Paternal Uncle    Breast cancer Cousin 30       mat 1st cousin    Social History Social History   Tobacco Use   Smoking status: Former    Current packs/day: 0.25    Average packs/day: 0.3 packs/day for 1 year (0.3 ttl pk-yrs)    Types: Cigarettes   Smokeless tobacco: Never  Vaping Use   Vaping status: Never Used  Substance Use Topics   Alcohol use: Not Currently   Drug use: No     Allergies   Aspirin and Penicillins   Review of Systems Review of Systems  Constitutional:  Negative for chills and fever.  HENT:  Positive for congestion, sinus pressure and sore throat. Negative for ear pain.   Eyes:  Negative for discharge and redness.  Respiratory:  Positive for cough. Negative for shortness of breath and wheezing.   Gastrointestinal:  Negative for abdominal pain, diarrhea, nausea and vomiting.     Physical Exam Triage Vital Signs ED Triage Vitals  Encounter Vitals Group     BP 02/11/23 1029 (!) 119/54     Systolic BP Percentile --      Diastolic BP Percentile --      Pulse Rate 02/11/23 1029 95     Resp 02/11/23 1029 20     Temp 02/11/23 1029 98 F (36.7 C)     Temp Source 02/11/23 1029 Oral     SpO2 02/11/23 1029 94 %     Weight 02/11/23 1031 279 lb (126.6 kg)     Height 02/11/23 1031 5\' 2"  (1.575 m)     Head Circumference --      Peak Flow --      Pain Score 02/11/23 1031 10     Pain Loc --      Pain Education --      Exclude from Growth Chart --    No  data found.  Updated Vital Signs BP (!) 119/54 (BP Location: Left Arm)   Pulse 95   Temp 98 F (36.7 C) (Oral)   Resp 20   Ht 5\' 2"  (1.575 m)   Wt 279 lb (126.6 kg)   SpO2 94%   BMI 51.03 kg/m   Physical Exam Vitals and nursing note reviewed.  Constitutional:      General: She is not in acute distress.    Appearance: Normal appearance. She is not ill-appearing.  HENT:     Head: Normocephalic and atraumatic.     Nose: Congestion present.     Mouth/Throat:     Mouth: Mucous membranes are moist.     Pharynx: No oropharyngeal exudate or posterior oropharyngeal erythema.  Eyes:     Conjunctiva/sclera: Conjunctivae normal.  Cardiovascular:     Rate and Rhythm: Normal rate and regular rhythm.     Heart sounds: Normal heart sounds. No murmur heard. Pulmonary:     Effort: Pulmonary effort is normal. No respiratory distress.     Breath sounds: Normal breath sounds. No wheezing, rhonchi or rales.  Skin:    General: Skin is warm and dry.  Neurological:     Mental Status: She is alert.  Psychiatric:        Mood and Affect:  Mood normal.        Thought Content: Thought content normal.      UC Treatments / Results  Labs (all labs ordered are listed, but only abnormal results are displayed) Labs Reviewed  SARS CORONAVIRUS 2 (TAT 6-24 HRS)  POCT INFLUENZA A/B    EKG   Radiology No results found.  Procedures Procedures (including critical care time)  Medications Ordered in UC Medications - No data to display  Initial Impression / Assessment and Plan / UC Course  I have reviewed the triage vital signs and the nursing notes.  Pertinent labs & imaging results that were available during my care of the patient were reviewed by me and considered in my medical decision making (see chart for details).    Suspect viral etiology of symptoms.  Rapid flu screening negative.  Will order COVID screening.  Recommended symptomatic treatment and Tessalon Perles prescribed.   Recommended follow-up if no gradual improvement with any further concerns.  Final Clinical Impressions(s) / UC Diagnoses   Final diagnoses:  Acute upper respiratory infection   Discharge Instructions   None    ED Prescriptions     Medication Sig Dispense Auth. Provider   benzonatate (TESSALON) 100 MG capsule Take 1 capsule (100 mg total) by mouth 3 (three) times daily as needed for cough. 21 capsule Tomi Bamberger, PA-C      PDMP not reviewed this encounter.   Tomi Bamberger, PA-C 02/21/23 2000

## 2023-02-11 NOTE — ED Triage Notes (Signed)
Patient c/o non-productive cough x 3 days, headache, some SOB, denies nasal drainage.  Patient has taken Benadryl and Tylenol.

## 2023-02-12 LAB — SARS CORONAVIRUS 2 (TAT 6-24 HRS): SARS Coronavirus 2: NEGATIVE

## 2023-02-13 ENCOUNTER — Ambulatory Visit: Payer: 59 | Admitting: Nurse Practitioner

## 2023-02-13 ENCOUNTER — Other Ambulatory Visit: Payer: Self-pay | Admitting: Physical Medicine and Rehabilitation

## 2023-02-13 ENCOUNTER — Ambulatory Visit (INDEPENDENT_AMBULATORY_CARE_PROVIDER_SITE_OTHER): Payer: 59

## 2023-02-13 VITALS — BP 110/80 | HR 96 | Temp 98.2°F | Ht 60.6 in | Wt 282.4 lb

## 2023-02-13 VITALS — BP 110/80 | HR 96 | Temp 98.2°F | Ht 60.6 in | Wt 282.0 lb

## 2023-02-13 DIAGNOSIS — R051 Acute cough: Secondary | ICD-10-CM | POA: Diagnosis not present

## 2023-02-13 DIAGNOSIS — Z0000 Encounter for general adult medical examination without abnormal findings: Secondary | ICD-10-CM

## 2023-02-13 DIAGNOSIS — M25561 Pain in right knee: Secondary | ICD-10-CM

## 2023-02-13 MED ORDER — HYDROCODONE BIT-HOMATROP MBR 5-1.5 MG/5ML PO SOLN
5.0000 mL | Freq: Four times a day (QID) | ORAL | 0 refills | Status: DC | PRN
Start: 2023-02-13 — End: 2023-06-18

## 2023-02-13 NOTE — Progress Notes (Signed)
Subjective:    Patient ID: Ashley Lindsey, female    DOB: 06/18/1960, 62 y.o.   MRN: [id number]  HPI An audio/video tele-health visit is felt to be the most appropriate encounter for this patient at this time. This is a follow up tele-visit via phone. The patient is at home. MD is at office. Prior to scheduling this appointment, our staff discussed the limitations of evaluation and management by telemedicine and the availability of in-person appointments. The patient expressed understanding and agreed to proceed.   Ashley Lindsey is a 62 year old woman who presents for f/u of lymphedema, lower extremity pain, and fibromyalgia  1) LUE lymphedema -garment was too expensive -when it flares it gives her no warning -she received a booklet and she does the therapy and sometimes it works and sometimes it doesn't -worse in the biceps -aches and wakes her up  2) Fibromyalgia -ran out of her gabapentin and she really felt this -working out in [employer] and [employer] -up and down with emotions -had to put her uncle in a nursing home and this circles her -she is cleaning out her uncle's room -she has had more flares recently.   3) Lower extremity pain -she sometimes feels that it radiates from her back -has not had any recent spinal imaging -she would be interested in increasing her dose of topamax  4) Obesity -she asks for alternatives to wegivy  5) Headaches: -taking tylenol but is wary of taking too much  6) Knee pain: -got viscosupplementation -has been in excruciating pain ever since -she notes that increase in topamax has been helpful -she had an issue with the nutrition referral and requests another  7) Stress due to illness of family members: -she lost two family members recently  8) Caregiver burnout: -she is still taking care of her mom full time  47) Recent pneumonia: -discussed recent pneumonia  10) right arm pain -she is not sure if this is lymphedema or  fibromyalgia  11) Bilateral knee pain: -getting worse -she was in severe pain on Monday after she had a lot of activity on Sunday   12) Joint pain in both feet  Pain Inventory Average Pain 9 Pain Right Now 9 My pain is constant, dull, and aching  In the last 24 hours, has pain interfered with the following? General activity 9 Relation with others 9 Enjoyment of life 9 What TIME of day is your pain at its worst? varies Sleep (in general) Fair  Pain is worse with: walking, sitting, inactivity, standing, and some activites Pain improves with: rest, heat/ice, therapy/exercise, pacing activities, medication, and injections Relief from Meds: 9  Family History  Problem Relation Age of Onset   Breast cancer Mother 36   Heart disease Father    Diabetes Sister    Prostate cancer Maternal Grandfather 71   Cancer Maternal Aunt 63       d. from "female cancer" possibly cervical   Breast cancer Maternal Aunt 60   Prostate cancer Maternal Uncle 78   Prostate cancer Maternal Uncle 80   Prostate cancer Paternal Uncle    Prostate cancer Paternal Uncle    Breast cancer Cousin 30       mat 1st cousin   Social History   Socioeconomic History   Marital status: Divorced    Spouse name: Not on file   Number of children: 1   Years of education: Not on file   Highest education level: Not on file  Occupational History   Occupation: disability  Tobacco Use   Smoking status: Former    Current packs/day: 0.25    Average packs/day: 0.3 packs/day for 1 year (0.3 ttl pk-yrs)    Types: Cigarettes   Smokeless tobacco: Never  Vaping Use   Vaping status: Never Used  Substance and Sexual Activity   Alcohol use: Not Currently   Drug use: No   Sexual activity: Not Currently    Birth control/protection: Surgical  Other Topics Concern   Not on file  Social History Narrative   Right handed    Coffee daily, sometimes Ginger ale    Social Determinants of Health   Financial Resource Strain:  Low Risk  (02/13/2023)   Overall Financial Resource Strain (CARDIA)    Difficulty of Paying Living Expenses: Not hard at all  Food Insecurity: No Food Insecurity (02/13/2023)   Hunger Vital Sign    Worried About Running Out of Food in the Last Year: Never true    Ran Out of Food in the Last Year: Never true  Transportation Needs: No Transportation Needs (02/13/2023)   PRAPARE - Administrator, Civil Service (Medical): No    Lack of Transportation (Non-Medical): No  Physical Activity: Inactive (02/13/2023)   Exercise Vital Sign    Days of Exercise per Week: 0 days    Minutes of Exercise per Session: 0 min  Stress: No Stress Concern Present (02/13/2023)   [name] of Occupational Health - Occupational Stress Questionnaire    Feeling of Stress : Not at all  Social Connections: Moderately Integrated (02/13/2023)   Social Connection and Isolation Panel [NHANES]    Frequency of Communication with Friends and Family: More than three times a week    Frequency of Social Gatherings with Friends and Family: More than three times a week    Attends Religious Services: More than 4 times per year    Active Member of Clubs or Organizations: Yes    Attends Banker Meetings: More than 4 times per year    Marital Status: Divorced   Past Surgical History:  Procedure Laterality Date   ABDOMINAL HYSTERECTOMY     with left salpingooophorectomy   BIOPSY  04/30/2022   Procedure: BIOPSY;  Surgeon: Sherrilyn Rist, MD;  Location: [facility];  Service: Gastroenterology;;   BREAST LUMPECTOMY  2009/2012   LEFT/RIGHT with lymph node dissection   COLONOSCOPY WITH PROPOFOL N/A 04/30/2022   Procedure: COLONOSCOPY WITH PROPOFOL;  Surgeon: Sherrilyn Rist, MD;  Location: [facility];  Service: Gastroenterology;  Laterality: N/A;   ESOPHAGOGASTRODUODENOSCOPY (EGD) WITH PROPOFOL N/A 04/30/2022   Procedure: ESOPHAGOGASTRODUODENOSCOPY (EGD) WITH PROPOFOL;  Surgeon: Sherrilyn Rist, MD;  Location: [facility];  Service: Gastroenterology;  Laterality: N/A;   HOT HEMOSTASIS N/A 04/30/2022   Procedure: HOT HEMOSTASIS (ARGON PLASMA COAGULATION/BICAP);  Surgeon: Sherrilyn Rist, MD;  Location: [facility];  Service: Gastroenterology;  Laterality: N/A;   KNEE ARTHROSCOPY     right   OOPHORECTOMY  2013   Past Surgical History:  Procedure Laterality Date   ABDOMINAL HYSTERECTOMY     with left salpingooophorectomy   BIOPSY  04/30/2022   Procedure: BIOPSY;  Surgeon: Sherrilyn Rist, MD;  Location: [facility];  Service: Gastroenterology;;   BREAST LUMPECTOMY  2009/2012   LEFT/RIGHT with lymph node dissection   COLONOSCOPY WITH PROPOFOL N/A 04/30/2022   Procedure: COLONOSCOPY WITH PROPOFOL;  Surgeon: Sherrilyn Rist, MD;  Location: [facility];  Service: Gastroenterology;  Laterality: N/A;   ESOPHAGOGASTRODUODENOSCOPY (EGD) WITH PROPOFOL N/A 04/30/2022   Procedure: ESOPHAGOGASTRODUODENOSCOPY (EGD) WITH PROPOFOL;  Surgeon: Sherrilyn Rist, MD;  Location: [facility];  Service: Gastroenterology;  Laterality: N/A;   HOT HEMOSTASIS N/A 04/30/2022   Procedure: HOT HEMOSTASIS (ARGON PLASMA COAGULATION/BICAP);  Surgeon: Sherrilyn Rist, MD;  Location: [facility];  Service: Gastroenterology;  Laterality: N/A;   KNEE ARTHROSCOPY     right   OOPHORECTOMY  2013   Past Medical History:  Diagnosis Date   Abdominal cramps    Anxiety    Arthritis    breast cancer    s/p radiation- last dose 6/12//has been off Tamoxifen 8/12   Chronic headaches    Costochondritis    Fatigue    Fibromyalgia 03/06/2013   GERD (gastroesophageal reflux disease)    History of COVID-19    Hypertension    Lymphedema    RIGHT ARM-////  STATES USE LEFT ARM FOR BP'S   Migraines    Muscle spasms of head and/or neck    following to the breast   Passed out    4th of july   Personal history of radiation therapy 2010   lt breast    S/P radiation therapy 08/19/07 - 10/10/07   Left  Breast/5040 cGy/28 fractions with Boost for a toatl dose of 6300 dGy   S/P radiation therapy 08/14/10 -10/03/10   right breast   Sleep apnea    STOP BANG SCORE 5   There were no vitals taken for this visit.  Opioid Risk Score:   Fall Risk Score:  `1  Depression screen [facility] 2/9     02/13/2023   11:41 AM 12/31/2022   11:39 AM 10/02/2022    1:47 PM 02/08/2022   10:11 AM 01/16/2022    9:14 AM 12/19/2021   10:39 AM 07/06/2021   10:17 AM  Depression screen PHQ 2/9  Decreased Interest 0 2 1 0 0 0 1  Down, Depressed, Hopeless 0 0 1 0 0 0 1  PHQ - 2 Score 0 2 2 0 0 0 2  Altered sleeping  3       Tired, decreased energy  3       Change in appetite  3       Feeling bad or failure about yourself   0       Trouble concentrating  2       Moving slowly or fidgety/restless  0       Suicidal thoughts  0       PHQ-9 Score  13       Difficult doing work/chores  Somewhat difficult          Review of Systems  Musculoskeletal:  Positive for neck pain.       B/L hips knee finger tip pain RT shoulder/arm  All other systems reviewed and are negative.      Objective:   Physical Exam Gen: no distress, normal appearing HEENT: oral mucosa pink and moist, NCAT Cardio: Reg rate Chest: normal effort, normal rate of breathing Abd: soft, non-distended Ext: no edema Psych: pleasant, normal affect Skin: intact Neuro: Alert and oriented x3 MSK; multiple trigger points     Assessment & Plan:   1) LUE lymphedema -will give script for garment -commended her on doing her lymphedema exercises -continue tiger balm  2) Fibromyalgia -increase gabapentin to 600mg  TID, provided refill -discussed her family stressors -continue tylenol -continue gabapentin, discussed can double of  dose for severe flares.  -Discussed current symptoms of pain and history of pain.  -Discussed benefits of exercise in reducing pain. -Discussed following foods that may reduce pain: 1) Ginger (especially studied for  arthritis)- reduce leukotriene production to decrease inflammation 2) Blueberries- high in phytonutrients that decrease inflammation 3) Salmon- marine omega-3s reduce joint swelling and pain 4) Pumpkin seeds- reduce inflammation 5) dark chocolate- reduces inflammation 6) turmeric- reduces inflammation 7) tart cherries - reduce pain and stiffness 8) extra virgin olive oil - its compound olecanthal helps to block prostaglandins  9) chili peppers- can be eaten or applied topically via capsaicin 10) mint- helpful for headache, muscle aches, joint pain, and itching 11) garlic- reduces inflammation  Link to further information on diet for chronic pain: [email or web address]   3) Right 5th digit DIP OA: -XR ordered  4) Bilateral knee pain -RF and ANA ordered.   5) Obesity: -discussed that stress can cause weight loss -continue topamax 50mg  HS -continue Rybelsius, increase to 14mg   -prescribed oral semglutide for weight loss -Educated that current weight is 274 lbs and current BMI is 53.59 Discussed following criteria for Wegovy: 1) Patient has diagnosis of obesity; 2) Patient must be 41 years of age or older; 3) The patient has been involved in a physician or dietitian monitored weight loss program, consisting of both low-calorie diet, increased physical activity and behavioral counseling for a minimum of 6 months without a 3% loss from baseline; 4) Patients BMI is one of the following: a) 30kg/m or greater; b) 27 29.99 kg/m in the presence of at least one weight related comorbidity such as coronary heart disease, dyslipidemia, hypertension, symptomatic arthritis of the lower extremities, type 2 diabetes mellitus, obstructive sleep apnea; c) 25-29.9 kg/m2 and waist circumference is &gt; 40 inches for males or &gt; 35 inches for females;5) Not Met - Must have tried and failed at least one preferred oral agent such as Phentermine;  6) Patient has no labeled contraindication; 7) Patient is not taking another weight loss medication; 8) The dose is within approved product labeling guidelines; 10) The patient must not be taking another GLP-1 receptor agonist AND the patient must not be taking insulin concurrently.  -prescribed [id number] but discussed that this was denied -referred to nutrition -recommended resistance training and prioritizing protein intake to avoid muscle loss while taking this medication -discussed risks of thyroid cancer and gastroparesis  -Educated regarding health benefits of weight loss- for pain, general health, chronic disease prevention, immune health, mental health.  -Will monitor weight every visit.  -Consider Roobois tea daily.  -Discussed the benefits of intermittent fasting. -Discussed foods that can assist in weight loss: 1) leafy greens- high in fiber and nutrients 2) dark chocolate- improves metabolism (if prefer sweetened, best to sweeten with honey instead of sugar).  3) cruciferous vegetables- high in fiber and protein 4) full fat yogurt: high in healthy fat, protein, calcium, and probiotics 5) apples- high in a variety of phytochemicals 6) nuts- high in fiber and protein that increase feelings of fullness 7) grapefruit: rich in nutrients, antioxidants, and fiber (not to be taken with anticoagulation) 8) beans- high in protein and fiber 9) salmon- has high quality protein and healthy fats 10) green tea- rich in polyphenols 11) eggs- rich in choline and vitamin D 12) tuna- high protein, boosts metabolism 13) avocado- decreases visceral abdominal fat 14) chicken (pasture raised): high in protein and iron 15) blueberries- reduce abdominal fat and cholesterol 16) whole grains- decreases calories retained during digestion, speeds  metabolism 17) chia seeds- curb appetite 18) chilies- increases fat metabolism  -Discussed supplements that can be used:  1) Metatrim 400mg  BID 30 minutes before  breakfast and dinner  2) Sphaeranthus indicus and Garcinia mangostana (combinations of these and #1 can be found in capsicum and zychrome  3) green coffee bean extract 400mg  twice per day or Irvingia (african mango) 150 to 300mg  twice per day.  6) Iron deficiency anemia: -discussed that when she supplemented she felt improved energy.   7) Radicular lumbar pain: -lumbar XR ordered -increase topamax HS to 50mg   8) Headaches: -discussed that topamax is usually beneficial for headaches -discussed home sleep apnea testing  9) depression: -discussed that it embarrassed her to have to give up her job -discussed that this continues -discussed that she has to make time for herself -discussed that her mom needs time from her and she may have mild cognitive impairment -discussed that on Sunday she didn't even come out of her room -discussed ketogenic diet  10) Knee pain: -discussed increased pain after viscosupplementation -discussed ketogenic diet -tried to get [facility] approved but it was denied -discussed that pain has been severe -discussed that pain is improved with increased topamax -discussed that she had an issue with nutritional referral, asked staff to check with patient if she would like to be referred to medical weight management  11) Stress due to illness of family members: -discussed how these shook her  71) Caregiver burnout: -discussed that she still takes care of her mom full time  50) History of pneumonia: -continue vitamin D supplement, Zinc, vitamin C  14) Joint pain in both feet: -discussed that started after she had a lot of activity on Sunday but there are flareups are getting more aggressive than before.  -food allergy testing ordered -recommended natto or nattokinase  15) right arm pain:  Discussed symptoms could be 2/2 fibromyalgia  16) Lower back pain/left hip pain: -Xrs ordered  8 minutes spent in discussion of the improvement in her pain in response  to increase in topamax, discussed that there was an issue with her nutrition referral, discussed that I would try to find alternative option for her

## 2023-02-13 NOTE — Patient Instructions (Signed)
Ashley Lindsey , Thank you for taking time to come for your Medicare Wellness Visit. I appreciate your ongoing commitment to your health goals. Please review the following plan we discussed and let me know if I can assist you in the future.   Referrals/Orders/Follow-Ups/Clinician Recommendations: none  This is a list of the screening recommended for you and due dates:  Health Maintenance  Topic Date Due   Eye exam for diabetics  Never done   Pap with HPV screening  Never done   COVID-19 Vaccine (4 - 2023-24 season) 12/23/2022   Yearly kidney health urinalysis for diabetes  03/06/2023   Flu Shot  07/22/2023*   Hemoglobin A1C  05/23/2023   Complete foot exam   07/05/2023   Yearly kidney function blood test for diabetes  11/20/2023   Mammogram  01/16/2024   Medicare Annual Wellness Visit  02/13/2024   Colon Cancer Screening  04/30/2032   DTaP/Tdap/Td vaccine (3 - Td or Tdap) 11/19/2032   Hepatitis C Screening  Completed   HIV Screening  Completed   Zoster (Shingles) Vaccine  Completed   HPV Vaccine  Aged Out  *Topic was postponed. The date shown is not the original due date.    Advanced directives: (Copy Requested) Please bring a copy of your health care power of attorney and living will to the office to be added to your chart at your convenience.  Next Medicare Annual Wellness Visit scheduled for next year: Yes  insert Preventive Care Attachment Reference

## 2023-02-13 NOTE — Progress Notes (Signed)
Ashley Lindsey, CMA,acting as a Neurosurgeon for Ashley Felts, FNP.,have documented all relevant documentation on the behalf of Ashley Felts, FNP,as directed by  Ashley Felts, FNP while in the presence of Ashley Felts, FNP.  Subjective:  Patient ID: Ashley Lindsey , female    DOB: 1960/11/28 , 62 y.o.   MRN: [id number]  Chief Complaint  Patient presents with   Cough    HPI  Patient presents today for a cough, Patient reports compliance with medication. Patient denies any chest pain, SOB, or headaches. Patient was treated for URI on 02/11/2023, patient reports still having a cough, clear secretions. She is having a headache when she coughs. She is unable to sleep at night.   Tessalon perles are not effective. She has used the albuterol inhaler at least 2 times a day     Past Medical History:  Diagnosis Date   Abdominal cramps    Anxiety    Arthritis    breast cancer    s/p radiation- last dose 6/12//has been off Tamoxifen 8/12   Chronic headaches    Costochondritis    Dyspnea 09/21/2014   Fatigue    Fibromyalgia 03/06/2013   GERD (gastroesophageal reflux disease)    History of COVID-19    Hypertension    Lymphedema    RIGHT ARM-////  STATES USE LEFT ARM FOR BP'S   Migraines    Muscle spasms of head and/or neck    following to the breast   Passed out    4th of july   Personal history of radiation therapy 2010   lt breast    S/P radiation therapy 08/19/07 - 10/10/07   Left Breast/5040 cGy/28 fractions with Boost for a toatl dose of 6300 dGy   S/P radiation therapy 08/14/10 -10/03/10   right breast   Sleep apnea    STOP BANG SCORE 5     Family History  Problem Relation Age of Onset   Breast cancer Mother 60   Heart disease Father    Diabetes Sister    Prostate cancer Maternal Grandfather 56   Cancer Maternal Aunt 63       d. from "female cancer" possibly cervical   Breast cancer Maternal Aunt 60   Prostate cancer Maternal Uncle 78   Prostate cancer Maternal Uncle 80    Prostate cancer Paternal Uncle    Prostate cancer Paternal Uncle    Breast cancer Cousin 30       mat 1st cousin     Current Outpatient Medications:    HYDROcodone bit-homatropine (HYDROMET) 5-1.5 MG/5ML syrup, Take 5 mLs by mouth every 6 (six) hours as needed for cough., Disp: 120 mL, Rfl: 0   acetaminophen (TYLENOL) 650 MG CR tablet, Take 650 mg by mouth every 8 (eight) hours as needed for pain., Disp: , Rfl:    albuterol (VENTOLIN HFA) 108 (90 Base) MCG/ACT inhaler, Inhale 2 puffs into the lungs every 4 (four) hours as needed for wheezing or shortness of breath., Disp: 1 each, Rfl: 0   atorvastatin (LIPITOR) 10 MG tablet, TAKE 1 TABLET BY MOUTH EVERY DAY AT NIGHT, Disp: 90 tablet, Rfl: 1   benzonatate (TESSALON) 100 MG capsule, Take 1 capsule (100 mg total) by mouth 3 (three) times daily as needed for cough., Disp: 21 capsule, Rfl: 0   Bisacodyl (DULCOLAX PO), Take 1 capsule by mouth daily as needed (constipation). , Disp: , Rfl:    cholecalciferol 5000 units TABS, Take 1 tablet (5,000 Units total) by mouth 2 (  two) times daily. (Patient taking differently: Take 5,000 Units by mouth daily.), Disp: , Rfl:    cyclobenzaprine (FLEXERIL) 10 MG tablet, Take 1 tablet (10 mg total) by mouth as needed., Disp: 30 tablet, Rfl: 1   dexlansoprazole (DEXILANT) 60 MG capsule, TAKE 1 CAPSULE BY MOUTH EVERY DAY, Disp: 90 capsule, Rfl: 1   diclofenac Sodium (VOLTAREN ARTHRITIS PAIN) 1 % GEL, Apply 2 g topically 4 (four) times daily., Disp: 100 g, Rfl: 1   diphenhydrAMINE (BENADRYL) 25 MG tablet, Take 1 tablet (25 mg total) by mouth every 6 (six) hours as needed for up to 20 doses (migraine). Take with reglan for migraine headache, Disp: 20 tablet, Rfl: 0   ELDERBERRY PO, Take 1 tablet by mouth daily., Disp: , Rfl:    ENTRESTO 24-26 MG, TAKE 1 TABLET BY MOUTH TWICE A DAY, Disp: 180 tablet, Rfl: 0   ferrous sulfate 324 (65 Fe) MG TBEC, Take 325 mg by mouth 2 (two) times daily., Disp: , Rfl:    fish oil-omega-3  fatty acids 1000 MG capsule, Take 1 g by mouth daily., Disp: , Rfl:    Flaxseed, Linseed, (FLAXSEED OIL MAX STR PO), Take 1 capsule by mouth daily. , Disp: , Rfl:    furosemide (LASIX) 20 MG tablet, Take 1 tablet (20 mg total) by mouth daily as needed., Disp: 30 tablet, Rfl: 3   gabapentin (NEURONTIN) 600 MG tablet, Take 1 tablet (600 mg total) by mouth 4 (four) times daily as needed., Disp: 120 tablet, Rfl: 3   meloxicam (MOBIC) 7.5 MG tablet, TAKE 1 TABLET BY MOUTH EVERY DAY, Disp: 90 tablet, Rfl: 0   Multiple Vitamins-Minerals (MULTIVITAMIN & MINERAL PO), Take 1 tablet by mouth daily., Disp: , Rfl:    mupirocin ointment (BACTROBAN) 2 %, Apply 1 Application topically 2 (two) times daily. (Patient taking differently: Apply 1 Application topically as needed.), Disp: 22 g, Rfl: 0   nitrofurantoin, macrocrystal-monohydrate, (MACROBID) 100 MG capsule, Take 1 capsule (100 mg total) by mouth 2 (two) times daily for 7 days., Disp: 14 capsule, Rfl: 0   ondansetron (ZOFRAN) 4 MG tablet, TAKE 1 TABLET BY MOUTH DAILY AS NEEDED FOR NAUSEA OR VOMITING, Disp: 30 tablet, Rfl: 1   OVER THE COUNTER MEDICATION, Take 125 mg by mouth daily. [place]/Simethicone, Disp: , Rfl:    polyethylene glycol (MIRALAX / GLYCOLAX) 17 g packet, Take 17 g by mouth daily as needed., Disp: , Rfl:    propranolol (INDERAL) 10 MG tablet, TAKE 1 TABLET BY MOUTH TWICE A DAY *HOLD IF SYSTOLIC BP (TOP #) IS <100MMHG OR PULSE IS <60BPM, Disp: 180 tablet, Rfl: 1   Semaglutide (RYBELSUS) 14 MG TABS, Take 1 tablet (14 mg total) by mouth daily., Disp: 90 tablet, Rfl: 3   spironolactone (ALDACTONE) 25 MG tablet, TAKE 1 TABLET BY MOUTH EVERY DAY IN THE MORNING, Disp: 90 tablet, Rfl: 0   SYMBICORT 80-4.5 MCG/ACT inhaler, INHALE 2 PUFFS INTO THE LUNGS IN THE MORNING AND AT BEDTIME., Disp: 30.6 each, Rfl: 1   topiramate (TOPAMAX) 50 MG tablet, Take 1 tablet (50 mg total) by mouth at bedtime. (Patient taking differently: Take 100 mg by mouth at bedtime.),  Disp: 90 tablet, Rfl: 3   triamcinolone cream (KENALOG) 0.1 %, APPLY TO AFFECTED AREA (RASH) TWICE A DAY AS NEEDED, Disp: 60 g, Rfl: 1   Turmeric 500 MG CAPS, Take by mouth., Disp: , Rfl:    venlafaxine XR (EFFEXOR-XR) 75 MG 24 hr capsule, Take 1 capsule (75 mg total)  by mouth in the morning and at bedtime., Disp: 180 capsule, Rfl: 1   Vitamin D, Ergocalciferol, (DRISDOL) 1.25 MG (50000 UNIT) CAPS capsule, Take 1 capsule (50,000 Units total) by mouth every 7 (seven) days., Disp: 20 capsule, Rfl: 0   vitamin E 200 UNIT capsule, Take 200 Units by mouth daily., Disp: , Rfl:    Allergies  Allergen Reactions   Aspirin     unknown   Penicillins Hives and Swelling    Has patient had a PCN reaction causing immediate rash, facial/tongue/throat swelling, SOB or lightheadedness with hypotension: Yes Has patient had a PCN reaction causing severe rash involving mucus membranes or skin necrosis: Yes Has patient had a PCN reaction that required hospitalization Yes Has patient had a PCN reaction occurring within the last 10 years: No If all of the above answers are "NO", then may proceed with Cephalosporin use.      Review of Systems  Constitutional: Negative.   Respiratory: Negative.    Cardiovascular: Negative.   Musculoskeletal: Negative.   Neurological: Negative.   Psychiatric/Behavioral: Negative.       Today's Vitals   02/13/23 1145  BP: 110/80  Pulse: 96  Temp: 98.2 F (36.8 C)  Weight: 282 lb (127.9 kg)  Height: 5' 0.6" (1.539 m)   Body mass index is 53.99 kg/m.  Wt Readings from Last 3 Encounters:  02/19/23 288 lb 6.4 oz (130.8 kg)  02/13/23 282 lb (127.9 kg)  02/13/23 282 lb 6.4 oz (128.1 kg)    The 10-year ASCVD risk score (Arnett DK, et al., 2019) is: 18.3%   Values used to calculate the score:     Age: 24 years     Sex: Female     Is Non-Hispanic African American: Yes     Diabetic: Yes     Tobacco smoker: No     Systolic Blood Pressure: 126 mmHg     Is BP treated:  Yes     HDL Cholesterol: 63 mg/dL     Total Cholesterol: 203 mg/dL  Objective:  Physical Exam Vitals reviewed.  Constitutional:      General: She is not in acute distress.    Appearance: Normal appearance. She is obese.  HENT:     Head: Normocephalic.     Mouth/Throat:     Mouth: Mucous membranes are moist.  Cardiovascular:     Rate and Rhythm: Normal rate and regular rhythm.     Pulses: Normal pulses.     Heart sounds: Normal heart sounds. No murmur heard. Pulmonary:     Effort: Pulmonary effort is normal. No respiratory distress.     Breath sounds: Normal breath sounds. No wheezing or rhonchi.     Comments: Semi productive cough observed during visit Chest:     Chest wall: No tenderness.  Skin:    General: Skin is warm and dry.  Neurological:     General: No focal deficit present.     Mental Status: She is alert and oriented to person, place, and time.     Cranial Nerves: No cranial nerve deficit.     Motor: No weakness.  Psychiatric:        Attention and Perception: Attention normal.        Mood and Affect: Mood and affect normal. Mood is not depressed.        Behavior: Behavior normal.        Thought Content: Thought content normal.        Cognition and Memory: Cognition  normal.        Judgment: Judgment normal.         Assessment And Plan:  Acute cough Assessment & Plan: She has been seen at Urgent care 2 days ago for cough over the counter medications and tessalon perles are ineffective, lungs sounds are clear bilaterally. Will treat with hydromet she is aware to not drive or operate heavy machinery when taking. If not better return call to office.  Orders: -     HYDROcodone Bit-Homatrop MBr; Take 5 mLs by mouth every 6 (six) hours as needed for cough.  Dispense: 120 mL; Refill: 0    No follow-ups on file.  Patient was given opportunity to ask questions. Patient verbalized understanding of the plan and was able to repeat key elements of the plan. All  questions were answered to their satisfaction.    Jeanell Sparrow, FNP, have reviewed all documentation for this visit. The documentation on 02/13/23 for the exam, diagnosis, procedures, and orders are all accurate and complete.   IF YOU HAVE BEEN REFERRED TO A SPECIALIST, IT MAY TAKE 1-2 WEEKS TO SCHEDULE/PROCESS THE REFERRAL. IF YOU HAVE NOT HEARD FROM US/SPECIALIST IN TWO WEEKS, PLEASE GIVE [place] A CALL AT [phone].

## 2023-02-13 NOTE — Progress Notes (Signed)
Subjective:   Ashley Lindsey is a 62 y.o. female who presents for Medicare Annual (Subsequent) preventive examination.  Visit Complete: In person    Cardiac Risk Factors include: obesity (BMI >30kg/m2)     Objective:    Today's Vitals   02/13/23 1125 02/13/23 1126  BP: 110/80   Pulse: 96   Temp: 98.2 F (36.8 C)   TempSrc: Oral   SpO2: 96%   Weight: 282 lb 6.4 oz (128.1 kg)   Height: 5' 0.6" (1.539 m)   PainSc:  8    Body mass index is 54.07 kg/m.     02/13/2023   11:39 AM 10/01/2022    4:13 PM 02/08/2022   10:10 AM 10/30/2021   11:52 AM 07/06/2021   10:18 AM 05/31/2021    9:46 AM 04/05/2021    6:26 PM  Advanced Directives  Does Patient Have a Medical Advance Directive? Yes Yes No No No No No  Type of Estate agent of [place];Living will        Copy of Healthcare Power of Attorney in Chart? No - copy requested        Would patient like information on creating a medical advance directive?   No - Patient declined No - Patient declined  No - Patient declined     Current Medications (verified) Outpatient Encounter Medications as of 02/13/2023  Medication Sig   acetaminophen (TYLENOL) 650 MG CR tablet Take 650 mg by mouth every 8 (eight) hours as needed for pain.   albuterol (VENTOLIN HFA) 108 (90 Base) MCG/ACT inhaler Inhale 2 puffs into the lungs every 4 (four) hours as needed for wheezing or shortness of breath.   atorvastatin (LIPITOR) 10 MG tablet TAKE 1 TABLET BY MOUTH EVERY DAY AT NIGHT   benzonatate (TESSALON) 100 MG capsule Take 1 capsule (100 mg total) by mouth 3 (three) times daily as needed for cough.   Bisacodyl (DULCOLAX PO) Take 1 capsule by mouth daily as needed (constipation).    cholecalciferol 5000 units TABS Take 1 tablet (5,000 Units total) by mouth 2 (two) times daily. (Patient taking differently: Take 5,000 Units by mouth daily.)   cyclobenzaprine (FLEXERIL) 10 MG tablet Take 1 tablet (10 mg total) by mouth as needed.    dexlansoprazole (DEXILANT) 60 MG capsule TAKE 1 CAPSULE BY MOUTH EVERY DAY   diclofenac Sodium (VOLTAREN ARTHRITIS PAIN) 1 % GEL Apply 2 g topically 4 (four) times daily.   diphenhydrAMINE (BENADRYL) 25 MG tablet Take 1 tablet (25 mg total) by mouth every 6 (six) hours as needed for up to 20 doses (migraine). Take with reglan for migraine headache   ELDERBERRY PO Take 1 tablet by mouth daily.   ENTRESTO 24-26 MG TAKE 1 TABLET BY MOUTH TWICE A DAY   ferrous sulfate 324 (65 Fe) MG TBEC Take 325 mg by mouth 2 (two) times daily.   fish oil-omega-3 fatty acids 1000 MG capsule Take 1 g by mouth daily.   Flaxseed, Linseed, (FLAXSEED OIL MAX STR PO) Take 1 capsule by mouth daily.    furosemide (LASIX) 20 MG tablet Take 1 tablet (20 mg total) by mouth daily as needed.   gabapentin (NEURONTIN) 600 MG tablet Take 1 tablet (600 mg total) by mouth 4 (four) times daily as needed.   meloxicam (MOBIC) 7.5 MG tablet TAKE 1 TABLET BY MOUTH EVERY DAY   Multiple Vitamins-Minerals (MULTIVITAMIN & MINERAL PO) Take 1 tablet by mouth daily.   mupirocin ointment (BACTROBAN) 2 % Apply  1 Application topically 2 (two) times daily. (Patient taking differently: Apply 1 Application topically as needed.)   ondansetron (ZOFRAN) 4 MG tablet TAKE 1 TABLET BY MOUTH DAILY AS NEEDED FOR NAUSEA OR VOMITING   OVER THE COUNTER MEDICATION Take 125 mg by mouth daily. [place]/Simethicone   polyethylene glycol (MIRALAX / GLYCOLAX) 17 g packet Take 17 g by mouth daily as needed.   propranolol (INDERAL) 10 MG tablet TAKE 1 TABLET BY MOUTH TWICE A DAY *HOLD IF SYSTOLIC BP (TOP #) IS <100MMHG OR PULSE IS <60BPM   Semaglutide (RYBELSUS) 14 MG TABS Take 1 tablet (14 mg total) by mouth daily.   spironolactone (ALDACTONE) 25 MG tablet TAKE 1 TABLET BY MOUTH EVERY DAY IN THE MORNING   SYMBICORT 80-4.5 MCG/ACT inhaler INHALE 2 PUFFS INTO THE LUNGS IN THE MORNING AND AT BEDTIME.   topiramate (TOPAMAX) 50 MG tablet Take 1 tablet (50 mg total) by mouth  at bedtime. (Patient taking differently: Take 100 mg by mouth at bedtime.)   triamcinolone cream (KENALOG) 0.1 % APPLY TO AFFECTED AREA (RASH) TWICE A DAY AS NEEDED   Turmeric 500 MG CAPS Take by mouth.   venlafaxine XR (EFFEXOR-XR) 75 MG 24 hr capsule Take 1 capsule (75 mg total) by mouth in the morning and at bedtime.   Vitamin D, Ergocalciferol, (DRISDOL) 1.25 MG (50000 UNIT) CAPS capsule TAKE 1 CAPSULE BY MOUTH EVERY 7 DAYS   vitamin E 200 UNIT capsule Take 200 Units by mouth daily.   No facility-administered encounter medications on file as of 02/13/2023.    Allergies (verified) Aspirin and Penicillins   History: Past Medical History:  Diagnosis Date   Abdominal cramps    Anxiety    Arthritis    breast cancer    s/p radiation- last dose 6/12//has been off Tamoxifen 8/12   Chronic headaches    Costochondritis    Fatigue    Fibromyalgia 03/06/2013   GERD (gastroesophageal reflux disease)    History of COVID-19    Hypertension    Lymphedema    RIGHT ARM-////  STATES USE LEFT ARM FOR BP'S   Migraines    Muscle spasms of head and/or neck    following to the breast   Passed out    4th of july   Personal history of radiation therapy 2010   lt breast    S/P radiation therapy 08/19/07 - 10/10/07   Left Breast/5040 cGy/28 fractions with Boost for a toatl dose of 6300 dGy   S/P radiation therapy 08/14/10 -10/03/10   right breast   Sleep apnea    STOP BANG SCORE 5   Past Surgical History:  Procedure Laterality Date   ABDOMINAL HYSTERECTOMY     with left salpingooophorectomy   BIOPSY  04/30/2022   Procedure: BIOPSY;  Surgeon: Sherrilyn Rist, MD;  Location: [facility];  Service: Gastroenterology;;   BREAST LUMPECTOMY  2009/2012   LEFT/RIGHT with lymph node dissection   COLONOSCOPY WITH PROPOFOL N/A 04/30/2022   Procedure: COLONOSCOPY WITH PROPOFOL;  Surgeon: Sherrilyn Rist, MD;  Location: [facility];  Service: Gastroenterology;  Laterality: N/A;    ESOPHAGOGASTRODUODENOSCOPY (EGD) WITH PROPOFOL N/A 04/30/2022   Procedure: ESOPHAGOGASTRODUODENOSCOPY (EGD) WITH PROPOFOL;  Surgeon: Sherrilyn Rist, MD;  Location: [facility];  Service: Gastroenterology;  Laterality: N/A;   HOT HEMOSTASIS N/A 04/30/2022   Procedure: HOT HEMOSTASIS (ARGON PLASMA COAGULATION/BICAP);  Surgeon: Sherrilyn Rist, MD;  Location: [facility];  Service: Gastroenterology;  Laterality: N/A;  KNEE ARTHROSCOPY     right   OOPHORECTOMY  2013   Family History  Problem Relation Age of Onset   Breast cancer Mother 74   Heart disease Father    Diabetes Sister    Prostate cancer Maternal Grandfather 58   Cancer Maternal Aunt 63       d. from "female cancer" possibly cervical   Breast cancer Maternal Aunt 60   Prostate cancer Maternal Uncle 78   Prostate cancer Maternal Uncle 80   Prostate cancer Paternal Uncle    Prostate cancer Paternal Uncle    Breast cancer Cousin 30       mat 1st cousin   Social History   Socioeconomic History   Marital status: Divorced    Spouse name: Not on file   Number of children: 1   Years of education: Not on file   Highest education level: Not on file  Occupational History   Occupation: disability  Tobacco Use   Smoking status: Former    Current packs/day: 0.25    Average packs/day: 0.3 packs/day for 1 year (0.3 ttl pk-yrs)    Types: Cigarettes   Smokeless tobacco: Never  Vaping Use   Vaping status: Never Used  Substance and Sexual Activity   Alcohol use: Not Currently   Drug use: No   Sexual activity: Not Currently    Birth control/protection: Surgical  Other Topics Concern   Not on file  Social History Narrative   Right handed    Coffee daily, sometimes Ginger ale    Social Determinants of Health   Financial Resource Strain: Low Risk  (02/13/2023)   Overall Financial Resource Strain (CARDIA)    Difficulty of Paying Living Expenses: Not hard at all  Food Insecurity: No Food Insecurity (02/13/2023)   Hunger  Vital Sign    Worried About Running Out of Food in the Last Year: Never true    Ran Out of Food in the Last Year: Never true  Transportation Needs: No Transportation Needs (02/13/2023)   PRAPARE - Administrator, Civil Service (Medical): No    Lack of Transportation (Non-Medical): No  Physical Activity: Inactive (02/13/2023)   Exercise Vital Sign    Days of Exercise per Week: 0 days    Minutes of Exercise per Session: 0 min  Stress: No Stress Concern Present (02/13/2023)   [name] of Occupational Health - Occupational Stress Questionnaire    Feeling of Stress : Not at all  Social Connections: Moderately Integrated (02/13/2023)   Social Connection and Isolation Panel [NHANES]    Frequency of Communication with Friends and Family: More than three times a week    Frequency of Social Gatherings with Friends and Family: More than three times a week    Attends Religious Services: More than 4 times per year    Active Member of [employer] or Organizations: Yes    Attends Engineer, structural: More than 4 times per year    Marital Status: Divorced    Tobacco Counseling Counseling given: Not Answered   Clinical Intake:  Pre-visit preparation completed: Yes  Pain : 0-10 Pain Score: 8  Pain Type: Acute pain Pain Location: Head Pain Descriptors / Indicators: Aching Pain Onset: In the past 7 days Pain Frequency: Intermittent     Nutritional Status: BMI > 30  Obese Nutritional Risks: None Diabetes: No  How often do you need to have someone help you when you read instructions, pamphlets, or other written materials from your  doctor or pharmacy?: 1 - Never  Interpreter Needed?: No  Information entered by :: NAllen LPN   Activities of Daily Living    02/13/2023   11:32 AM  In your present state of health, do you have any difficulty performing the following activities:  Hearing? 1  Comment has hearing aids  Vision? 0  Difficulty concentrating or  making decisions? 0  Walking or climbing stairs? 0  Dressing or bathing? 0  Doing errands, shopping? 0  Preparing Food and eating ? N  Using the Toilet? N  In the past six months, have you accidently leaked urine? N  Do you have problems with loss of bowel control? N  Managing your Medications? N  Managing your Finances? N  Housekeeping or managing your Housekeeping? N    Patient Care Team: [name], FNP as PCP - General (General Practice)  Indicate any recent Medical Services you may have received from other than [facility] providers in the past year (date may be approximate).     Assessment:   This is a routine wellness examination for Roman.  Hearing/Vision screen Hearing Screening - Comments:: Has hearing aids that are maintained Vision Screening - Comments:: Regular eye exams, [facility]   Goals Addressed             This Visit's Progress    Patient Stated       02/13/2023, get back in the pool       Depression Screen    02/13/2023   11:41 AM 12/31/2022   11:39 AM 10/02/2022    1:47 PM 02/08/2022   10:11 AM 01/16/2022    9:14 AM 12/19/2021   10:39 AM 07/06/2021   10:17 AM  PHQ 2/9 Scores  PHQ - 2 Score 0 2 2 0 0 0 2  PHQ- 9 Score  13         Fall Risk    02/13/2023   11:40 AM 12/31/2022   11:39 AM 10/02/2022    1:47 PM 02/08/2022   10:11 AM 01/16/2022    9:14 AM  Fall Risk   Falls in the past year? 0 1 0 0 0  Number falls in past yr: 0 0 0 0   Injury with Fall? 0 0 0 0   Risk for fall due to : Medication side effect;Impaired mobility;Impaired balance/gait No Fall Risks  Medication side effect   Follow up Falls prevention discussed;Falls evaluation completed Falls evaluation completed  Falls prevention discussed;Education provided;Falls evaluation completed     MEDICARE RISK AT HOME: Medicare Risk at Home Any stairs in or around the home?: Yes If so, are there any without handrails?: No Home free of loose throw rugs in walkways, pet beds,  electrical cords, etc?: Yes Adequate lighting in your home to reduce risk of falls?: Yes Life alert?: No Use of a cane, walker or w/c?: No Grab bars in the bathroom?: Yes Shower chair or bench in shower?: Yes Elevated toilet seat or a handicapped toilet?: Yes  TIMED UP AND GO:  Was the test performed?  Yes  Length of time to ambulate 10 feet: 7 sec Gait slow and steady without use of assistive device    Cognitive Function:        02/13/2023   11:41 AM 02/08/2022   10:13 AM 01/11/2021    2:07 PM 12/02/2019    2:59 PM 10/09/2018   11:51 AM  6CIT Screen  What Year? 0 points 0 points 0 points 0 points 0  points  What month? 0 points 0 points 0 points 0 points 0 points  What time? 0 points 0 points 0 points 0 points 0 points  Count back from 20 0 points 0 points 4 points 0 points 0 points  Months in reverse 4 points 2 points 0 points 0 points 2 points  Repeat phrase 6 points 2 points 10 points 4 points 0 points  Total Score 10 points 4 points 14 points 4 points 2 points    Immunizations Immunization History  Administered Date(s) Administered   Influenza Inj Mdck Quad Pf 05/20/2017   Influenza Split 04/21/2012   Influenza,inj,Quad PF,6+ Mos 05/19/2013, 05/16/2015, 01/26/2016, 02/27/2018, 01/20/2019, 02/03/2020, 02/08/2021, 02/08/2022   Influenza-Unspecified 02/24/2018   PFIZER(Purple Top)SARS-COV-2 Vaccination 07/23/2019, 08/17/2019, 04/04/2020   Pneumococcal Polysaccharide-23 02/08/2021   Tdap 11/05/2012, 11/20/2022   Zoster Recombinant(Shingrix) 10/16/2021, 03/05/2022    TDAP status: Up to date  Flu Vaccine status: Due, Education has been provided regarding the importance of this vaccine. Advised may receive this vaccine at local pharmacy or Health Dept. Aware to provide a copy of the vaccination record if obtained from local pharmacy or Health Dept. Verbalized acceptance and understanding.  Pneumococcal vaccine status: Up to date  Covid-19 vaccine status: Information  provided on how to obtain vaccines.   Qualifies for Shingles Vaccine? Yes   Zostavax completed Yes   Shingrix Completed?: Yes  Screening Tests Health Maintenance  Topic Date Due   OPHTHALMOLOGY EXAM  Never done   Cervical Cancer Screening (HPV/Pap Cotest)  Never done   Diabetic kidney evaluation - Urine ACR  03/06/2023   COVID-19 Vaccine (4 - 2023-24 season) 03/01/2023 (Originally 12/23/2022)   INFLUENZA VACCINE  07/22/2023 (Originally 11/22/2022)   HEMOGLOBIN A1C  05/23/2023   FOOT EXAM  07/05/2023   Diabetic kidney evaluation - eGFR measurement  11/20/2023   MAMMOGRAM  01/16/2024   Medicare Annual Wellness (AWV)  02/13/2024   Colonoscopy  04/30/2032   DTaP/Tdap/Td (3 - Td or Tdap) 11/19/2032   Hepatitis C Screening  Completed   HIV Screening  Completed   Zoster Vaccines- Shingrix  Completed   HPV VACCINES  Aged Out    Health Maintenance  Health Maintenance Due  Topic Date Due   OPHTHALMOLOGY EXAM  Never done   Cervical Cancer Screening (HPV/Pap Cotest)  Never done   Diabetic kidney evaluation - Urine ACR  03/06/2023    Colorectal cancer screening: Type of screening: Colonoscopy. Completed 1/8/204. Repeat every 5 years  Mammogram status: Completed 2024. Repeat every year  Bone Density status: n/a  Lung Cancer Screening: (Low Dose CT Chest recommended if Age 20-80 years, 20 pack-year currently smoking OR have quit w/in 15years.) does not qualify.   Lung Cancer Screening Referral: no  Additional Screening:  Hepatitis C Screening: does qualify; Completed 10/09/2018  Vision Screening: Recommended annual ophthalmology exams for early detection of glaucoma and other disorders of the eye. Is the patient up to date with their annual eye exam?  No  Who is the provider or what is the name of the office in which the patient attends annual eye exams? [facility] If pt is not established with a provider, would they like to be referred to a provider to establish care? No .    Dental Screening: Recommended annual dental exams for proper oral hygiene  Diabetic Foot Exam: Diabetic Foot Exam: Completed 07/05/2022  Community Resource Referral / Chronic Care Management: CRR required this visit?  No   CCM required this visit?  No     Plan:     I have personally reviewed and noted the following in the patient's chart:   Medical and social history Use of alcohol, tobacco or illicit drugs  Current medications and supplements including opioid prescriptions. Patient is not currently taking opioid prescriptions. Functional ability and status Nutritional status Physical activity Advanced directives List of other physicians Hospitalizations, surgeries, and ER visits in previous 12 months Vitals Screenings to include cognitive, depression, and falls Referrals and appointments  In addition, I have reviewed and discussed with patient certain preventive protocols, quality metrics, and best practice recommendations. A written personalized care plan for preventive services as well as general preventive health recommendations were provided to patient.     Barb Merino, LPN   16/01/9603   After Visit Summary: (In Person-Printed) AVS printed and given to the patient  Nurse Notes:  none

## 2023-02-18 ENCOUNTER — Encounter: Payer: Self-pay | Admitting: Nurse Practitioner

## 2023-02-19 ENCOUNTER — Ambulatory Visit (INDEPENDENT_AMBULATORY_CARE_PROVIDER_SITE_OTHER): Payer: 59 | Admitting: Nurse Practitioner

## 2023-02-19 ENCOUNTER — Telehealth: Payer: Self-pay

## 2023-02-19 ENCOUNTER — Encounter: Payer: Self-pay | Admitting: Nurse Practitioner

## 2023-02-19 ENCOUNTER — Ambulatory Visit: Payer: Self-pay | Admitting: Cardiology

## 2023-02-19 VITALS — BP 126/70 | HR 94 | Temp 97.7°F | Ht 60.0 in | Wt 288.4 lb

## 2023-02-19 DIAGNOSIS — I10 Essential (primary) hypertension: Secondary | ICD-10-CM | POA: Diagnosis not present

## 2023-02-19 DIAGNOSIS — R350 Frequency of micturition: Secondary | ICD-10-CM | POA: Diagnosis not present

## 2023-02-19 DIAGNOSIS — Z79899 Other long term (current) drug therapy: Secondary | ICD-10-CM | POA: Diagnosis not present

## 2023-02-19 DIAGNOSIS — R319 Hematuria, unspecified: Secondary | ICD-10-CM | POA: Diagnosis not present

## 2023-02-19 DIAGNOSIS — E11620 Type 2 diabetes mellitus with diabetic dermatitis: Secondary | ICD-10-CM

## 2023-02-19 DIAGNOSIS — E66813 Obesity, class 3: Secondary | ICD-10-CM

## 2023-02-19 DIAGNOSIS — R109 Unspecified abdominal pain: Secondary | ICD-10-CM | POA: Diagnosis not present

## 2023-02-19 DIAGNOSIS — Z1231 Encounter for screening mammogram for malignant neoplasm of breast: Secondary | ICD-10-CM

## 2023-02-19 DIAGNOSIS — Z6841 Body Mass Index (BMI) 40.0 and over, adult: Secondary | ICD-10-CM

## 2023-02-19 LAB — POCT URINALYSIS DIP (CLINITEK)
Bilirubin, UA: NEGATIVE
Glucose, UA: NEGATIVE mg/dL
Ketones, POC UA: NEGATIVE mg/dL
Nitrite, UA: NEGATIVE
POC PROTEIN,UA: NEGATIVE
Spec Grav, UA: 1.025 (ref 1.010–1.025)
Urobilinogen, UA: 0.2 U/dL
pH, UA: 6 (ref 5.0–8.0)

## 2023-02-19 MED ORDER — NITROFURANTOIN MONOHYD MACRO 100 MG PO CAPS
100.0000 mg | ORAL_CAPSULE | Freq: Two times a day (BID) | ORAL | 0 refills | Status: DC
Start: 2023-02-19 — End: 2023-02-24

## 2023-02-19 MED ORDER — VITAMIN D (ERGOCALCIFEROL) 1.25 MG (50000 UNIT) PO CAPS: 50000.0000 [IU] | ORAL_CAPSULE | ORAL | 0 refills | Status: DC

## 2023-02-19 MED ORDER — CYCLOBENZAPRINE HCL 10 MG PO TABS: 10.0000 mg | ORAL_TABLET | ORAL | 1 refills | Status: DC | PRN

## 2023-02-19 NOTE — Assessment & Plan Note (Addendum)
Urinalysis has small leuks, will send for culture. Will also treat for UTI due to recent UTI.

## 2023-02-19 NOTE — Assessment & Plan Note (Signed)
Blood pressure is well controlled, continue current medications.

## 2023-02-19 NOTE — Assessment & Plan Note (Signed)
 Trace blood

## 2023-02-19 NOTE — Assessment & Plan Note (Signed)
Hemoglobin A1c is stable.  Continue current medications, tolerating well. I will check her levels today due to having urinary frequency. She would like to start Ozempic, we will discuss at her next visit in November

## 2023-02-19 NOTE — Assessment & Plan Note (Signed)
Tenderness to right posterior abdomen however I feel is muscle pain. She is to try her flexeril to see if improves.

## 2023-02-19 NOTE — Telephone Encounter (Signed)
Refill request Vit d

## 2023-02-19 NOTE — Assessment & Plan Note (Signed)
She is encouraged to strive for BMI less than 30 to decrease cardiac risk. Advised to aim for at least 150 minutes of exercise per week. She is not moving as much and is moving slower today

## 2023-02-19 NOTE — Progress Notes (Signed)
Ashley Lindsey, CMA,acting as a Neurosurgeon for Ashley Felts, FNP.,have documented all relevant documentation on the behalf of Ashley Felts, FNP,as directed by  Ashley Felts, FNP while in the presence of Ashley Felts, FNP.  Subjective:  Patient ID: Ashley Lindsey , female    DOB: 16-Mar-1961 , 62 y.o.   MRN: [id number]  Chief Complaint  Patient presents with   Urinary Tract Infection    HPI  Patient presents today for a possible UTI, Patient reports compliance with medication. Patient denies any chest pain, SOB, or headaches. Patient currently has urinary frequency for about 8 days. She noticed even when using the bathroom she was not urinating in spite of drinking water. Yesterday started feeling "out of it". She reports sharp constant right side pain as well. She has taken tylenol for her pain with minimal relief. She has not been on any antibiotics from her "cold"   Reports she had a hysterectomy done by Dr. Antionette Lindsey  BP Readings from Last 3 Encounters: 02/19/23 : 126/70 02/13/23 : 110/80 02/13/23 : 110/80       Past Medical History:  Diagnosis Date   Abdominal cramps    Anxiety    Arthritis    breast cancer    s/p radiation- last dose 6/12//has been off Tamoxifen 8/12   Chronic headaches    Costochondritis    Fatigue    Fibromyalgia 03/06/2013   GERD (gastroesophageal reflux disease)    History of COVID-19    Hypertension    Lymphedema    RIGHT ARM-////  STATES USE LEFT ARM FOR BP'S   Migraines    Muscle spasms of head and/or neck    following to the breast   Passed out    4th of july   Personal history of radiation therapy 2010   lt breast    S/P radiation therapy 08/19/07 - 10/10/07   Left Breast/5040 cGy/28 fractions with Boost for a toatl dose of 6300 dGy   S/P radiation therapy 08/14/10 -10/03/10   right breast   Sleep apnea    STOP BANG SCORE 5     Family History  Problem Relation Age of Onset   Breast cancer Mother 55   Heart disease Father     Diabetes Sister    Prostate cancer Maternal Grandfather 68   Cancer Maternal Aunt 63       d. from "female cancer" possibly cervical   Breast cancer Maternal Aunt 60   Prostate cancer Maternal Uncle 78   Prostate cancer Maternal Uncle 80   Prostate cancer Paternal Uncle    Prostate cancer Paternal Uncle    Breast cancer Cousin 30       mat 1st cousin     Current Outpatient Medications:    acetaminophen (TYLENOL) 650 MG CR tablet, Take 650 mg by mouth every 8 (eight) hours as needed for pain., Disp: , Rfl:    albuterol (VENTOLIN HFA) 108 (90 Base) MCG/ACT inhaler, Inhale 2 puffs into the lungs every 4 (four) hours as needed for wheezing or shortness of breath., Disp: 1 each, Rfl: 0   atorvastatin (LIPITOR) 10 MG tablet, TAKE 1 TABLET BY MOUTH EVERY DAY AT NIGHT, Disp: 90 tablet, Rfl: 1   benzonatate (TESSALON) 100 MG capsule, Take 1 capsule (100 mg total) by mouth 3 (three) times daily as needed for cough., Disp: 21 capsule, Rfl: 0   Bisacodyl (DULCOLAX PO), Take 1 capsule by mouth daily as needed (constipation). , Disp: , Rfl:  cholecalciferol 5000 units TABS, Take 1 tablet (5,000 Units total) by mouth 2 (two) times daily. (Patient taking differently: Take 5,000 Units by mouth daily.), Disp: , Rfl:    dexlansoprazole (DEXILANT) 60 MG capsule, TAKE 1 CAPSULE BY MOUTH EVERY DAY, Disp: 90 capsule, Rfl: 1   diclofenac Sodium (VOLTAREN ARTHRITIS PAIN) 1 % GEL, Apply 2 g topically 4 (four) times daily., Disp: 100 g, Rfl: 1   diphenhydrAMINE (BENADRYL) 25 MG tablet, Take 1 tablet (25 mg total) by mouth every 6 (six) hours as needed for up to 20 doses (migraine). Take with reglan for migraine headache, Disp: 20 tablet, Rfl: 0   ELDERBERRY PO, Take 1 tablet by mouth daily., Disp: , Rfl:    ENTRESTO 24-26 MG, TAKE 1 TABLET BY MOUTH TWICE A DAY, Disp: 180 tablet, Rfl: 0   ferrous sulfate 324 (65 Fe) MG TBEC, Take 325 mg by mouth 2 (two) times daily., Disp: , Rfl:    fish oil-omega-3 fatty acids  1000 MG capsule, Take 1 g by mouth daily., Disp: , Rfl:    Flaxseed, Linseed, (FLAXSEED OIL MAX STR PO), Take 1 capsule by mouth daily. , Disp: , Rfl:    furosemide (LASIX) 20 MG tablet, Take 1 tablet (20 mg total) by mouth daily as needed., Disp: 30 tablet, Rfl: 3   gabapentin (NEURONTIN) 600 MG tablet, Take 1 tablet (600 mg total) by mouth 4 (four) times daily as needed., Disp: 120 tablet, Rfl: 3   HYDROcodone bit-homatropine (HYDROMET) 5-1.5 MG/5ML syrup, Take 5 mLs by mouth every 6 (six) hours as needed for cough., Disp: 120 mL, Rfl: 0   meloxicam (MOBIC) 7.5 MG tablet, TAKE 1 TABLET BY MOUTH EVERY DAY, Disp: 90 tablet, Rfl: 0   Multiple Vitamins-Minerals (MULTIVITAMIN & MINERAL PO), Take 1 tablet by mouth daily., Disp: , Rfl:    mupirocin ointment (BACTROBAN) 2 %, Apply 1 Application topically 2 (two) times daily. (Patient taking differently: Apply 1 Application topically as needed.), Disp: 22 g, Rfl: 0   nitrofurantoin, macrocrystal-monohydrate, (MACROBID) 100 MG capsule, Take 1 capsule (100 mg total) by mouth 2 (two) times daily for 7 days., Disp: 14 capsule, Rfl: 0   ondansetron (ZOFRAN) 4 MG tablet, TAKE 1 TABLET BY MOUTH DAILY AS NEEDED FOR NAUSEA OR VOMITING, Disp: 30 tablet, Rfl: 1   OVER THE COUNTER MEDICATION, Take 125 mg by mouth daily. [place]/Simethicone, Disp: , Rfl:    polyethylene glycol (MIRALAX / GLYCOLAX) 17 g packet, Take 17 g by mouth daily as needed., Disp: , Rfl:    propranolol (INDERAL) 10 MG tablet, TAKE 1 TABLET BY MOUTH TWICE A DAY *HOLD IF SYSTOLIC BP (TOP #) IS <100MMHG OR PULSE IS <60BPM, Disp: 180 tablet, Rfl: 1   Semaglutide (RYBELSUS) 14 MG TABS, Take 1 tablet (14 mg total) by mouth daily., Disp: 90 tablet, Rfl: 3   spironolactone (ALDACTONE) 25 MG tablet, TAKE 1 TABLET BY MOUTH EVERY DAY IN THE MORNING, Disp: 90 tablet, Rfl: 0   SYMBICORT 80-4.5 MCG/ACT inhaler, INHALE 2 PUFFS INTO THE LUNGS IN THE MORNING AND AT BEDTIME., Disp: 30.6 each, Rfl: 1   topiramate  (TOPAMAX) 50 MG tablet, Take 1 tablet (50 mg total) by mouth at bedtime. (Patient taking differently: Take 100 mg by mouth at bedtime.), Disp: 90 tablet, Rfl: 3   triamcinolone cream (KENALOG) 0.1 %, APPLY TO AFFECTED AREA (RASH) TWICE A DAY AS NEEDED, Disp: 60 g, Rfl: 1   Turmeric 500 MG CAPS, Take by mouth., Disp: , Rfl:  venlafaxine XR (EFFEXOR-XR) 75 MG 24 hr capsule, Take 1 capsule (75 mg total) by mouth in the morning and at bedtime., Disp: 180 capsule, Rfl: 1   Vitamin D, Ergocalciferol, (DRISDOL) 1.25 MG (50000 UNIT) CAPS capsule, TAKE 1 CAPSULE BY MOUTH EVERY 7 DAYS, Disp: 20 capsule, Rfl: 0   vitamin E 200 UNIT capsule, Take 200 Units by mouth daily., Disp: , Rfl:    cyclobenzaprine (FLEXERIL) 10 MG tablet, Take 1 tablet (10 mg total) by mouth as needed., Disp: 30 tablet, Rfl: 1   Allergies  Allergen Reactions   Aspirin     unknown   Penicillins Hives and Swelling    Has patient had a PCN reaction causing immediate rash, facial/tongue/throat swelling, SOB or lightheadedness with hypotension: Yes Has patient had a PCN reaction causing severe rash involving mucus membranes or skin necrosis: Yes Has patient had a PCN reaction that required hospitalization Yes Has patient had a PCN reaction occurring within the last 10 years: No If all of the above answers are "NO", then may proceed with Cephalosporin use.      Review of Systems  Constitutional: Negative.   HENT: Negative.    Eyes: Negative.   Respiratory: Negative.    Cardiovascular: Negative.   Gastrointestinal:  Positive for abdominal pain (right abdomen pain). Negative for nausea and vomiting.  Genitourinary:  Positive for frequency.  Neurological: Negative.   Psychiatric/Behavioral: Negative.       Today's Vitals   02/19/23 0837  BP: 126/70  Pulse: 94  Temp: 97.7 F (36.5 C)  TempSrc: Oral  Weight: 288 lb 6.4 oz (130.8 kg)  Height: 5' (1.524 m)  PainSc: 7   PainLoc: Abdomen   Body mass index is 56.32 kg/m.   Wt Readings from Last 3 Encounters:  02/19/23 288 lb 6.4 oz (130.8 kg)  02/13/23 282 lb (127.9 kg)  02/13/23 282 lb 6.4 oz (128.1 kg)     Objective:  Physical Exam Vitals reviewed.  Constitutional:      General: She is not in acute distress.    Appearance: Normal appearance. She is obese.  Cardiovascular:     Rate and Rhythm: Normal rate and regular rhythm.     Pulses: Normal pulses.     Heart sounds: Normal heart sounds. No murmur heard. Pulmonary:     Effort: Pulmonary effort is normal. No respiratory distress.     Breath sounds: Normal breath sounds. No wheezing.  Abdominal:     General: Abdomen is flat. Bowel sounds are normal. There is no distension.     Palpations: Abdomen is soft.     Tenderness: There is abdominal tenderness (right flank area). There is no right CVA tenderness or left CVA tenderness.  Musculoskeletal:        General: No swelling, tenderness or signs of injury.  Skin:    General: Skin is warm.  Neurological:     General: No focal deficit present.     Mental Status: She is alert and oriented to person, place, and time.     Cranial Nerves: No cranial nerve deficit.     Motor: No weakness.  Psychiatric:        Attention and Perception: Attention normal.        Mood and Affect: Mood and affect normal. Mood is not depressed.        Behavior: Behavior normal.        Thought Content: Thought content normal.        Cognition and Memory: Cognition  normal.        Judgment: Judgment normal.         Assessment And Plan:  Urinary frequency Assessment & Plan: Urinalysis has small leuks, will send for culture. Will also treat for UTI due to recent UTI.   Orders: -     POCT URINALYSIS DIP (CLINITEK) -     Hemoglobin A1c -     Nitrofurantoin Monohyd Macro; Take 1 capsule (100 mg total) by mouth 2 (two) times daily for 7 days.  Dispense: 14 capsule; Refill: 0  Right sided abdominal pain Assessment & Plan: Tenderness to right posterior abdomen however I  feel is muscle pain. She is to try her flexeril to see if improves.    Hematuria, unspecified type Assessment & Plan: Trace blood.  Orders: -     Urine Culture  Essential hypertension Assessment & Plan: Blood pressure is well controlled, continue current medications   Type 2 diabetes mellitus with diabetic dermatitis, without long-term current use of insulin (HCC) Assessment & Plan: Hemoglobin A1c is stable.  Continue current medications, tolerating well. I will check her levels today due to having urinary frequency. She would like to start Ozempic, we will discuss at her next visit in November  Orders: -     CMP14+EGFR -     Hemoglobin A1c  Class 3 severe obesity due to excess calories with serious comorbidity and body mass index (BMI) of 50.0 to 59.9 in adult [facility]) Assessment & Plan: She is encouraged to strive for BMI less than 30 to decrease cardiac risk. Advised to aim for at least 150 minutes of exercise per week. She is not moving as much and is moving slower today   Encounter for screening mammogram for breast cancer -     Digital Screening Mammogram, Left and Right; Future  Other long term (current) drug therapy -     CBC  Other orders -     Cyclobenzaprine HCl; Take 1 tablet (10 mg total) by mouth as needed.  Dispense: 30 tablet; Refill: 1    Return for KEEP SAME NEXT.  Patient was given opportunity to ask questions. Patient verbalized understanding of the plan and was able to repeat key elements of the plan. All questions were answered to their satisfaction.    Jeanell Sparrow, FNP, have reviewed all documentation for this visit. The documentation on 02/19/23 for the exam, diagnosis, procedures, and orders are all accurate and complete.   IF YOU HAVE BEEN REFERRED TO A SPECIALIST, IT MAY TAKE 1-2 WEEKS TO SCHEDULE/PROCESS THE REFERRAL. IF YOU HAVE NOT HEARD FROM US/SPECIALIST IN TWO WEEKS, PLEASE GIVE [place] A CALL AT [phone].

## 2023-02-20 ENCOUNTER — Encounter: Payer: Self-pay | Admitting: Nurse Practitioner

## 2023-02-20 DIAGNOSIS — R051 Acute cough: Secondary | ICD-10-CM | POA: Insufficient documentation

## 2023-02-20 NOTE — Assessment & Plan Note (Signed)
She has been seen at Urgent care 2 days ago for cough over the counter medications and tessalon perles are ineffective, lungs sounds are clear bilaterally. Will treat with hydromet she is aware to not drive or operate heavy machinery when taking. If not better return call to office.

## 2023-02-22 DIAGNOSIS — R350 Frequency of micturition: Secondary | ICD-10-CM | POA: Diagnosis not present

## 2023-02-22 DIAGNOSIS — E11620 Type 2 diabetes mellitus with diabetic dermatitis: Secondary | ICD-10-CM | POA: Diagnosis not present

## 2023-02-22 DIAGNOSIS — G4733 Obstructive sleep apnea (adult) (pediatric): Secondary | ICD-10-CM | POA: Diagnosis not present

## 2023-02-22 DIAGNOSIS — Z79899 Other long term (current) drug therapy: Secondary | ICD-10-CM | POA: Diagnosis not present

## 2023-02-23 LAB — CBC
Hematocrit: 39.5 % (ref 34.0–46.6)
Hemoglobin: 11.8 g/dL (ref 11.1–15.9)
MCH: 25.5 pg — ABNORMAL LOW (ref 26.6–33.0)
MCHC: 29.9 g/dL — ABNORMAL LOW (ref 31.5–35.7)
MCV: 85 fL (ref 79–97)
Platelets: 372 10*3/uL (ref 150–450)
RBC: 4.63 x10E6/uL (ref 3.77–5.28)
RDW: 14.3 % (ref 11.7–15.4)
WBC: 9.2 10*3/uL (ref 3.4–10.8)

## 2023-02-23 LAB — CMP14+EGFR
ALT: 11 [IU]/L (ref 0–32)
AST: 15 [IU]/L (ref 0–40)
Albumin: 4.1 g/dL (ref 3.9–4.9)
Alkaline Phosphatase: 76 [IU]/L (ref 44–121)
BUN/Creatinine Ratio: 10 — ABNORMAL LOW (ref 12–28)
BUN: 6 mg/dL — ABNORMAL LOW (ref 8–27)
Bilirubin Total: <0.2 mg/dL (ref 0.0–1.2)
CO2: 27 mmol/L (ref 20–29)
Calcium: 8.9 mg/dL (ref 8.7–10.3)
Chloride: 102 mmol/L (ref 96–106)
Creatinine, Ser: 0.59 mg/dL (ref 0.57–1.00)
Globulin, Total: 2.7 g/dL (ref 1.5–4.5)
Glucose: 81 mg/dL (ref 70–99)
Potassium: 4.7 mmol/L (ref 3.5–5.2)
Sodium: 142 mmol/L (ref 134–144)
Total Protein: 6.8 g/dL (ref 6.0–8.5)
eGFR: 101 mL/min/{1.73_m2} (ref >59)

## 2023-02-23 LAB — URINE CULTURE

## 2023-02-23 LAB — HEMOGLOBIN A1C
Est. average glucose Bld gHb Est-mCnc: 134 mg/dL
Hgb A1c MFr Bld: 6.3 % — ABNORMAL HIGH (ref 4.8–5.6)

## 2023-02-24 ENCOUNTER — Other Ambulatory Visit: Payer: Self-pay | Admitting: Nurse Practitioner

## 2023-02-24 MED ORDER — SULFAMETHOXAZOLE-TRIMETHOPRIM 800-160 MG PO TABS: 1.0000 | ORAL_TABLET | Freq: Two times a day (BID) | ORAL | 0 refills | Status: DC

## 2023-02-25 ENCOUNTER — Other Ambulatory Visit: Payer: Self-pay

## 2023-02-25 MED ORDER — TOPIRAMATE 50 MG PO TABS: 100.0000 mg | ORAL_TABLET | Freq: Every day | ORAL | 3 refills | Status: DC

## 2023-02-26 ENCOUNTER — Ambulatory Visit (INDEPENDENT_AMBULATORY_CARE_PROVIDER_SITE_OTHER): Payer: 59 | Admitting: Physician Assistant

## 2023-02-26 ENCOUNTER — Encounter: Payer: Self-pay | Admitting: Physician Assistant

## 2023-02-26 DIAGNOSIS — M1711 Unilateral primary osteoarthritis, right knee: Secondary | ICD-10-CM

## 2023-02-26 DIAGNOSIS — M1712 Unilateral primary osteoarthritis, left knee: Secondary | ICD-10-CM

## 2023-02-26 DIAGNOSIS — M170 Bilateral primary osteoarthritis of knee: Secondary | ICD-10-CM | POA: Diagnosis not present

## 2023-02-26 MED ORDER — METHYLPREDNISOLONE ACETATE 40 MG/ML IJ SUSP
40.0000 mg | INTRAMUSCULAR | Status: AC | PRN
Start: 2023-02-26 — End: 2023-02-26
  Administered 2023-02-26: 40 mg via INTRA_ARTICULAR

## 2023-02-26 MED ORDER — LIDOCAINE HCL 1 % IJ SOLN
3.0000 mL | INTRAMUSCULAR | Status: AC | PRN
Start: 2023-02-26 — End: 2023-02-26
  Administered 2023-02-26: 3 mL

## 2023-02-26 NOTE — Progress Notes (Signed)
Office Visit Note   Patient: Ashley Lindsey           Date of Birth: [birth year]           MRN: [id number] Visit Date: 02/26/2023              Requested by: [name], FNP [address] [place],  [place] [address] PCP: [name], FNP  No chief complaint on file.     HPI: Ashley Lindsey is a pleasant 62 year old woman with bilateral osteoarthritis of her knees.  She periodically comes in for injections into her knees.  She is a diabetic but fairly well-controlled.  She did try dural line which actually made her knee worse.  She has had no new injury comes in requesting bilateral injections she has started Ozempic and hoping of weight loss.  Assessment & Plan: Visit Diagnoses: Osteoarthritis bilateral knees  Plan: Will go forward with bilateral knee injections today may follow-up with me as needed  Follow-Up Instructions: Return if symptoms worsen or fail to improve.   Ortho Exam  Patient is alert, oriented, no adenopathy, well-dressed, normal affect, normal respiratory effort. Examination she has no effusion no erythema compartments are soft and compressible she has an antalgic gait.  Negative Homans' sign no signs of infection in either knee  Imaging: No results found. No images are attached to the encounter.  Labs: Lab Results  Component Value Date   HGBA1C 6.3 (H) 02/22/2023   HGBA1C 6.3 (H) 11/20/2022   HGBA1C 5.8 (H) 03/05/2022   ESRSEDRATE 48 (H) 08/26/2019   LABURIC 4.8 08/26/2019   REPTSTATUS 06/12/2017 FINAL 06/10/2017   CULT >=100,000 COLONIES/mL ESCHERICHIA COLI (A) 06/10/2017   LABORGA ESCHERICHIA COLI (A) 06/10/2017     Lab Results  Component Value Date   ALBUMIN 4.1 02/22/2023   ALBUMIN 4.3 03/29/2022   ALBUMIN 4.2 03/05/2022    Lab Results  Component Value Date   MG 1.9 01/16/2022   MG 2.0 02/17/2020   Lab Results  Component Value Date   VD25OH 51.5 11/20/2022   VD25OH 41.6 01/16/2022   VD25OH 32 12/26/2011    No results  found for: "PREALBUMIN"    Latest Ref Rng & Units 02/22/2023   11:17 AM 07/05/2022   12:15 PM 04/12/2022    4:13 PM  CBC EXTENDED  WBC 3.4 - 10.8 x10E3/uL 9.2  8.9  9.6   RBC 3.77 - 5.28 x10E6/uL 4.63  4.81  4.83   Hemoglobin 11.1 - 15.9 g/dL 81.8  29.9  37.1   HCT 34.0 - 46.6 % 39.5  39.3  36.7   Platelets 150 - 450 x10E3/uL 372  351.0  293.0   NEUT# 1.4 - 7.7 K/uL  5.4  6.2   Lymph# 0.7 - 4.0 K/uL  2.7  2.6      There is no height or weight on file to calculate BMI.  Orders:  No orders of the defined types were placed in this encounter.  No orders of the defined types were placed in this encounter.    Procedures: Large Joint Inj: bilateral knee on 02/26/2023 3:14 PM Indications: pain and diagnostic evaluation Details: 25 G 1.5 in needle, anteromedial approach  Arthrogram: No  Medications (Right): 3 mL lidocaine 1 %; 40 mg methylPREDNISolone acetate 40 MG/ML Medications (Left): 3 mL lidocaine 1 %; 40 mg methylPREDNISolone acetate 40 MG/ML Outcome: tolerated well, no immediate complications Procedure, treatment alternatives, risks and benefits explained, specific risks discussed. Consent was given by the patient.  Clinical Data: No additional findings.  ROS:  All other systems negative, except as noted in the HPI. Review of Systems  Objective: Vital Signs: There were no vitals taken for this visit.  Specialty Comments:  No specialty comments available.  PMFS History: Patient Active Problem List   Diagnosis Date Noted  . Acute cough 02/20/2023  . Right sided abdominal pain 02/19/2023  . Hematuria 02/19/2023  . Abnormal urine odor 12/31/2022  . Cervicogenic headache 12/31/2022  . Urinary frequency 11/28/2022  . Urinary tract infection without hematuria 11/28/2022  . Elevated cholesterol 11/20/2022  . Vitamin D deficiency 11/20/2022  . Tibialis posterior tendinitis 10/08/2022  . OSA (obstructive sleep apnea) 09/04/2022  . Claustrophobia 09/04/2022  .  Suboptimal nutrition (HCC) 07/05/2022  . Cardiomyopathy, unspecified type (HCC) 07/05/2022  . Essential hypertension 07/05/2022  . Type 2 diabetes mellitus with diabetic dermatitis, without long-term current use of insulin (HCC) 07/05/2022  . Chronic gastric ulcer without hemorrhage and without perforation 04/30/2022  . Gastric AVM 04/30/2022  . Iron deficiency anemia 03/06/2022  . Unilateral primary osteoarthritis, left knee 10/02/2021  . Pulmonary nodules 07/12/2021  . Chronic cough 07/12/2021  . Avulsion fracture of middle phalanx of finger 04/18/2021  . Tibial plateau fracture, left 04/18/2021  . Asymmetrical sensorineural hearing loss 02/02/2021  . Bilateral primary osteoarthritis of knee 02/09/2020  . Chronic intermittent hypoxia with obstructive sleep apnea 08/24/2019  . Chronic intractable headache 07/14/2019  . Morning headache 07/14/2019  . Loud snoring 07/14/2019  . Super obesity 07/14/2019  . Sleeps in sitting position due to orthopnea 07/14/2019  . Chest pain due to GERD 07/14/2019  . Nocturia more than twice per night 07/14/2019  . Unilateral primary osteoarthritis, right knee 05/26/2019  . Chronic migraine without aura without status migrainosus, not intractable 05/13/2019  . Class 3 severe obesity due to excess calories without serious comorbidity with body mass index (BMI) of 40.0 to 44.9 in adult (HCC) 08/05/2018  . Depression 08/05/2018  . Chronic nonintractable headache 08/05/2018  . Chronic pain of right ankle 04/24/2018  . Breast cancer of lower-outer quadrant of right female breast (HCC) 12/02/2014  . Bronchospasm 09/21/2014  . Chronic pain of right knee 09/21/2014  . Fibromyalgia 03/06/2013  . Dense breasts 03/06/2013  . Mastalgia 03/06/2013  . Back pain, lumbosacral 03/06/2013  . Lymphedema of arm 03/06/2013  . Atypical chest pain 01/06/2013  . Breast cancer of upper-inner quadrant of left female breast (HCC) 09/08/2012  . Family history of malignant  neoplasm of breast 09/08/2012  . S/P radiation therapy   . History of breast cancer in female 08/13/2011   Past Medical History:  Diagnosis Date  . Abdominal cramps   . Anxiety   . Arthritis   . breast cancer    s/p radiation- last dose 6/12//has been off Tamoxifen 8/12  . Chronic headaches   . Costochondritis   . Dyspnea 09/21/2014  . Fatigue   . Fibromyalgia 03/06/2013  . GERD (gastroesophageal reflux disease)   . History of COVID-19   . Hypertension   . Lymphedema    RIGHT ARM-////  STATES USE LEFT ARM FOR BP'S  . Migraines   . Muscle spasms of head and/or neck    following to the breast  . Passed out    4th of july  . Personal history of radiation therapy 2010   lt breast   . S/P radiation therapy 08/19/07 - 10/10/07   Left Breast/5040 cGy/28 fractions with Boost for a toatl dose of 6300  dGy  . S/P radiation therapy 08/14/10 -10/03/10   right breast  . Sleep apnea    STOP BANG SCORE 5    Family History  Problem Relation Age of Onset  . Breast cancer Mother 30  . Heart disease Father   . Diabetes Sister   . Prostate cancer Maternal Grandfather 70  . Cancer Maternal Aunt 63       d. from "female cancer" possibly cervical  . Breast cancer Maternal Aunt 60  . Prostate cancer Maternal Uncle 78  . Prostate cancer Maternal Uncle 80  . Prostate cancer Paternal Uncle   . Prostate cancer Paternal Uncle   . Breast cancer Cousin 30       mat 1st cousin    Past Surgical History:  Procedure Laterality Date  . ABDOMINAL HYSTERECTOMY     with left salpingooophorectomy  . BIOPSY  04/30/2022   Procedure: BIOPSY;  Surgeon: Sherrilyn Rist, MD;  Location: [facility];  Service: Gastroenterology;;  . BREAST LUMPECTOMY  2009/2012   LEFT/RIGHT with lymph node dissection  . COLONOSCOPY WITH PROPOFOL N/A 04/30/2022   Procedure: COLONOSCOPY WITH PROPOFOL;  Surgeon: Sherrilyn Rist, MD;  Location: [facility];  Service: Gastroenterology;  Laterality: N/A;  .  ESOPHAGOGASTRODUODENOSCOPY (EGD) WITH PROPOFOL N/A 04/30/2022   Procedure: ESOPHAGOGASTRODUODENOSCOPY (EGD) WITH PROPOFOL;  Surgeon: Sherrilyn Rist, MD;  Location: [facility];  Service: Gastroenterology;  Laterality: N/A;  . HOT HEMOSTASIS N/A 04/30/2022   Procedure: HOT HEMOSTASIS (ARGON PLASMA COAGULATION/BICAP);  Surgeon: Sherrilyn Rist, MD;  Location: [facility];  Service: Gastroenterology;  Laterality: N/A;  . KNEE ARTHROSCOPY     right  . OOPHORECTOMY  2013   Social History   Occupational History  . Occupation: disability  Tobacco Use  . Smoking status: Former    Current packs/day: 0.25    Average packs/day: 0.3 packs/day for 1 year (0.3 ttl pk-yrs)    Types: Cigarettes  . Smokeless tobacco: Never  Vaping Use  . Vaping status: Never Used  Substance and Sexual Activity  . Alcohol use: Not Currently  . Drug use: No  . Sexual activity: Not Currently    Birth control/protection: Surgical

## 2023-02-28 DIAGNOSIS — G4733 Obstructive sleep apnea (adult) (pediatric): Secondary | ICD-10-CM | POA: Diagnosis not present

## 2023-03-01 ENCOUNTER — Other Ambulatory Visit: Payer: Self-pay | Admitting: Nurse Practitioner

## 2023-03-05 ENCOUNTER — Ambulatory Visit: Payer: 59 | Admitting: Clinical

## 2023-03-05 DIAGNOSIS — F321 Major depressive disorder, single episode, moderate: Secondary | ICD-10-CM | POA: Diagnosis not present

## 2023-03-05 NOTE — Progress Notes (Signed)
Time: 9:00am-9:55am CPT Code: 16109U Diagnosis:F32.1  Ashley Lindsey was seen in person for therapy. Session began by completing her treatment plan. Ashley Lindsey had input into and provided verbal consent to all goals and interventions. She shared more information about her history and current situation. For homework, she will continue practicing "Ashley Lindsey time" and taking an hour to herself daily. She will also continue journaling[facility]. She is scheduled to be seen again in one month.   Treatment Plan Client Abilities/Strengths  Ashley Lindsey presented as insightful and motivated.  Client Treatment Preferences Ashley Lindsey did not express specific preferences for her appointments.  Client Statement of Needs  Ashley Lindsey reported that she tends to keep issues bottled up inside, and needs a place to talk about them. Treatment Level   Ashley Lindsey reported that she would like monthly or bi-monthly appointments. Symptoms  Fatigue, depressed mood, lack of motivation, lack of direction Problems Addressed  Ashley Lindsey would like to improve symptoms of depression. Goals 1. Ashley Lindsey would like to develop emotion regulation strategies to manage symptoms of depression and anxiety Objective Ashley Lindsey will develop emotion regulation strategies Target Date: 03/04/2024 Frequency: Monthly/every two months  Progress: 0 Modality: Individual therapy  Related Interventions Ashley Lindsey will have opportunities to process her experiences in session Therapist will help Ashley Lindsey notice and disengage from maladaptive thoughts and behaviors using CBT based strategies Therapist will engage you in discussion of relaxation and emotion regulation strategies, including mindfulness, breathing exercises, self-etc. Therapist will provide referrals for additional resources as appropriate  Diagnosis Major Depression, Single Episode, moderate               Ashley Noa, PhD

## 2023-03-06 ENCOUNTER — Encounter: Payer: Self-pay | Admitting: Nurse Practitioner

## 2023-03-08 ENCOUNTER — Other Ambulatory Visit: Payer: Self-pay

## 2023-03-08 ENCOUNTER — Telehealth: Payer: Self-pay

## 2023-03-08 DIAGNOSIS — E11620 Type 2 diabetes mellitus with diabetic dermatitis: Secondary | ICD-10-CM

## 2023-03-08 MED ORDER — SEMAGLUTIDE(0.25 OR 0.5MG/DOS) 2 MG/3ML ~~LOC~~ SOPN
0.5000 mg | PEN_INJECTOR | SUBCUTANEOUS | 0 refills | Status: DC
Start: 2023-03-08 — End: 2023-04-29

## 2023-03-08 NOTE — Telephone Encounter (Signed)
Patient was called and advised to stop Ozempic until her appt on 03/14/2023 to discuss more with provider. Patient verbalized understanding.

## 2023-03-12 ENCOUNTER — Ambulatory Visit: Payer: 59 | Admitting: Nurse Practitioner

## 2023-03-13 NOTE — Progress Notes (Signed)
[name], CMA,acting as a Neurosurgeon for [name], FNP.,have documented all relevant documentation on the behalf of [name], FNP,as directed by  [name], FNP while in the presence of [name], FNP.  Subjective:    Patient ID: Ashley Lindsey , female    DOB: 02-Mar-1961 , 62 y.o.   MRN: [id number]  Chief Complaint  Patient presents with   Annual Exam    HPI  Patient presents today for HM, Patient reports compliance with medication. Patient denies any chest pain, SOB, or headaches. Patient reports last week she took her ozempic Monday,Tuesday, and Wednesday. Patient reports she felt fine after but was just concerned.  Patient has had a hysterectomy about 10 years ago.  Note in 2013 before by Dr. Jean Rosenthal indicating she had a hysterectomy/oopherectomy. She has an appt at the end of December or the first of January. She is also seeing a counselor regularly has had 3 appts. She is planning to have her knee surgery in April she has to decrease her weight by 65 lbs. She is having difficulty with walking.    Patient reports [place], she reports she scraped up her legs and arms. She is unsure if her equilibrium was off.     Past Medical History:  Diagnosis Date   Abdominal cramps    Anxiety    Arthritis    breast cancer    s/p radiation- last dose 6/12//has been off Tamoxifen 8/12   Chronic headaches    Costochondritis    Dyspnea 09/21/2014   Fatigue    Fibromyalgia 03/06/2013   GERD (gastroesophageal reflux disease)    History of COVID-19    Hypertension    Lymphedema    RIGHT ARM-////  STATES USE LEFT ARM FOR BP'S   Migraines    Muscle spasms of head and/or neck    following to the breast   Passed out    4th of july   Personal history of radiation therapy 2010   lt breast    S/P radiation therapy 08/19/07 - 10/10/07   Left Breast/5040 cGy/28 fractions with Boost for a toatl dose of 6300 dGy   S/P radiation therapy  08/14/10 -10/03/10   right breast   Sleep apnea    STOP BANG SCORE 5     Family History  Problem Relation Age of Onset   Breast cancer Mother 73   Heart disease Father    Diabetes Sister    Prostate cancer Maternal Grandfather 39   Cancer Maternal Aunt 63       d. from "female cancer" possibly cervical   Breast cancer Maternal Aunt 60   Prostate cancer Maternal Uncle 78   Prostate cancer Maternal Uncle 80   Prostate cancer Paternal Uncle    Prostate cancer Paternal Uncle    Breast cancer Cousin 30       mat 1st cousin     Current Outpatient Medications:    acetaminophen (TYLENOL) 650 MG CR tablet, Take 650 mg by mouth every 8 (eight) hours as needed for pain., Disp: , Rfl:    albuterol (VENTOLIN HFA) 108 (90 Base) MCG/ACT inhaler, Inhale 2 puffs into the lungs every 4 (four) hours as needed for wheezing or shortness of breath., Disp: 1 each, Rfl: 0   atorvastatin (LIPITOR) 10 MG tablet, TAKE 1 TABLET BY MOUTH EVERY DAY AT NIGHT, Disp: 90 tablet, Rfl: 1   benzonatate (TESSALON) 100 MG capsule,  Take 1 capsule (100 mg total) by mouth 3 (three) times daily as needed for cough., Disp: 21 capsule, Rfl: 0   Bisacodyl (DULCOLAX PO), Take 1 capsule by mouth daily as needed (constipation). , Disp: , Rfl:    cholecalciferol 5000 units TABS, Take 1 tablet (5,000 Units total) by mouth 2 (two) times daily. (Patient taking differently: Take 5,000 Units by mouth daily.), Disp: , Rfl:    cyclobenzaprine (FLEXERIL) 10 MG tablet, Take 1 tablet (10 mg total) by mouth as needed., Disp: 30 tablet, Rfl: 1   dexlansoprazole (DEXILANT) 60 MG capsule, TAKE 1 CAPSULE BY MOUTH EVERY DAY, Disp: 90 capsule, Rfl: 1   diclofenac Sodium (VOLTAREN ARTHRITIS PAIN) 1 % GEL, Apply 2 g topically 4 (four) times daily., Disp: 100 g, Rfl: 1   diphenhydrAMINE (BENADRYL) 25 MG tablet, Take 1 tablet (25 mg total) by mouth every 6 (six) hours as needed for up to 20 doses (migraine). Take with reglan for migraine headache, Disp:  20 tablet, Rfl: 0   ELDERBERRY PO, Take 1 tablet by mouth daily., Disp: , Rfl:    ENTRESTO 24-26 MG, TAKE 1 TABLET BY MOUTH TWICE A DAY, Disp: 180 tablet, Rfl: 0   ferrous sulfate 324 (65 Fe) MG TBEC, Take 325 mg by mouth 2 (two) times daily., Disp: , Rfl:    fish oil-omega-3 fatty acids 1000 MG capsule, Take 1 g by mouth daily., Disp: , Rfl:    Flaxseed, Linseed, (FLAXSEED OIL MAX STR PO), Take 1 capsule by mouth daily. , Disp: , Rfl:    furosemide (LASIX) 20 MG tablet, Take 1 tablet (20 mg total) by mouth daily as needed., Disp: 30 tablet, Rfl: 3   gabapentin (NEURONTIN) 600 MG tablet, Take 1 tablet (600 mg total) by mouth 4 (four) times daily as needed., Disp: 120 tablet, Rfl: 3   HYDROcodone bit-homatropine (HYDROMET) 5-1.5 MG/5ML syrup, Take 5 mLs by mouth every 6 (six) hours as needed for cough., Disp: 120 mL, Rfl: 0   Multiple Vitamins-Minerals (MULTIVITAMIN & MINERAL PO), Take 1 tablet by mouth daily., Disp: , Rfl:    mupirocin ointment (BACTROBAN) 2 %, Apply 1 Application topically 2 (two) times daily. (Patient taking differently: Apply 1 Application topically as needed.), Disp: 22 g, Rfl: 0   ondansetron (ZOFRAN) 4 MG tablet, TAKE 1 TABLET BY MOUTH DAILY AS NEEDED FOR NAUSEA OR VOMITING, Disp: 30 tablet, Rfl: 1   OVER THE COUNTER MEDICATION, Take 125 mg by mouth daily. [place]/Simethicone, Disp: , Rfl:    polyethylene glycol (MIRALAX / GLYCOLAX) 17 g packet, Take 17 g by mouth daily as needed., Disp: , Rfl:    propranolol (INDERAL) 10 MG tablet, TAKE 1 TABLET BY MOUTH TWICE A DAY *HOLD IF SYSTOLIC BP (TOP #) IS <100MMHG OR PULSE IS <60BPM, Disp: 180 tablet, Rfl: 1   Semaglutide,0.25 or 0.5MG /DOS, 2 MG/3ML SOPN, Inject 0.5 mg into the skin once a week., Disp: 2 mL, Rfl: 0   spironolactone (ALDACTONE) 25 MG tablet, TAKE 1 TABLET BY MOUTH EVERY DAY IN THE MORNING, Disp: 90 tablet, Rfl: 0   sulfamethoxazole-trimethoprim (BACTRIM DS) 800-160 MG tablet, Take 1 tablet by mouth 2 (two) times  daily., Disp: 10 tablet, Rfl: 0   SYMBICORT 80-4.5 MCG/ACT inhaler, INHALE 2 PUFFS INTO THE LUNGS IN THE MORNING AND AT BEDTIME., Disp: 30.6 each, Rfl: 1   topiramate (TOPAMAX) 50 MG tablet, Take 2 tablets (100 mg total) by mouth at bedtime., Disp: 180 tablet, Rfl: 3   triamcinolone cream (  KENALOG) 0.1 %, APPLY TO AFFECTED AREA (RASH) TWICE A DAY AS NEEDED, Disp: 60 g, Rfl: 1   Turmeric 500 MG CAPS, Take by mouth., Disp: , Rfl:    venlafaxine XR (EFFEXOR-XR) 75 MG 24 hr capsule, Take 1 capsule (75 mg total) by mouth in the morning and at bedtime., Disp: 180 capsule, Rfl: 1   Vitamin D, Ergocalciferol, (DRISDOL) 1.25 MG (50000 UNIT) CAPS capsule, Take 1 capsule (50,000 Units total) by mouth every 7 (seven) days., Disp: 20 capsule, Rfl: 0   vitamin E 200 UNIT capsule, Take 200 Units by mouth daily., Disp: , Rfl:    meloxicam (MOBIC) 7.5 MG tablet, Take 1 tablet (7.5 mg total) by mouth daily., Disp: 90 tablet, Rfl: 1   Allergies  Allergen Reactions   Aspirin     unknown   Penicillins Hives and Swelling    Has patient had a PCN reaction causing immediate rash, facial/tongue/throat swelling, SOB or lightheadedness with hypotension: Yes Has patient had a PCN reaction causing severe rash involving mucus membranes or skin necrosis: Yes Has patient had a PCN reaction that required hospitalization Yes Has patient had a PCN reaction occurring within the last 10 years: No If all of the above answers are "NO", then may proceed with Cephalosporin use.       The patient states she is status post hysterectomy.  No LMP recorded. Patient has had a hysterectomy.   Negative for: breast discharge, breast lump(s), breast pain and breast self exam. Associated symptoms include abnormal vaginal bleeding. Pertinent negatives include abnormal bleeding (hematology), anxiety, decreased libido, depression, difficulty falling sleep, dyspareunia, history of infertility, nocturia, sexual dysfunction, sleep disturbances,  urinary incontinence, urinary urgency, vaginal discharge and vaginal itching. Diet regular; she is eating fruits and vegetables with some protein. She may eat once a day. She is drinking lemon water and not drinking any sodas. The other day she forgot to eat.  The patient states her exercise level is minimal.   The patient's tobacco use is:  Social History   Tobacco Use  Smoking Status Former   Current packs/day: 0.25   Average packs/day: 0.3 packs/day for 1 year (0.3 ttl pk-yrs)   Types: Cigarettes  Smokeless Tobacco Never   She has been exposed to passive smoke. The patient's alcohol use is:  Social History   Substance and Sexual Activity  Alcohol Use Not Currently    Review of Systems  Constitutional: Negative.   HENT: Negative.    Eyes: Negative.   Respiratory: Negative.    Cardiovascular: Negative.   Gastrointestinal:  Negative for constipation.  Endocrine: Negative.   Genitourinary: Negative.   Musculoskeletal:  Positive for arthralgias.       Bilateral knee pain  Skin: Negative.   Allergic/Immunologic: Negative.   Neurological: Negative.   Hematological: Negative.   Psychiatric/Behavioral: Negative.       Today's Vitals   03/14/23 0950  BP: 130/78  Pulse: 90  Temp: 97.7 F (36.5 C)  TempSrc: Oral  Weight: 287 lb (130.2 kg)  Height: 5' (1.524 m)  PainSc: 0-No pain   Body mass index is 56.05 kg/m.  Wt Readings from Last 3 Encounters:  03/14/23 287 lb (130.2 kg)  02/19/23 288 lb 6.4 oz (130.8 kg)  02/13/23 282 lb (127.9 kg)     Objective:  Physical Exam Vitals reviewed.  Constitutional:      General: She is not in acute distress.    Appearance: Normal appearance. She is well-developed. She is obese.  HENT:  Head: Normocephalic and atraumatic.     Right Ear: Hearing, tympanic membrane, ear canal and external ear normal. There is no impacted cerumen.     Left Ear: Hearing, tympanic membrane, ear canal and external ear normal. There is no impacted  cerumen.     Nose: Nose normal.     Mouth/Throat:     Mouth: Mucous membranes are moist.  Eyes:     General: Lids are normal.     Extraocular Movements: Extraocular movements intact.     Conjunctiva/sclera: Conjunctivae normal.     Pupils: Pupils are equal, round, and reactive to light.     Funduscopic exam:    Right eye: No papilledema.        Left eye: No papilledema.  Neck:     Thyroid: No thyroid mass.     Vascular: No carotid bruit.  Cardiovascular:     Rate and Rhythm: Normal rate and regular rhythm.     Pulses: Normal pulses.     Heart sounds: Normal heart sounds. No murmur heard. Pulmonary:     Effort: Pulmonary effort is normal. No respiratory distress.     Breath sounds: Normal breath sounds. No wheezing.  Chest:     Chest wall: No mass or swelling.  Breasts:    Tanner Score is 5.     Right: Normal. No swelling, mass or tenderness.     Left: Normal. No swelling, mass or tenderness.  Abdominal:     General: Bowel sounds are normal. There is no distension.     Palpations: Abdomen is soft.     Tenderness: There is no abdominal tenderness (right lower abdomen area).     Hernia: No hernia is present.  Musculoskeletal:        General: No swelling or tenderness. Normal range of motion.     Cervical back: Full passive range of motion without pain, normal range of motion and neck supple.     Right lower leg: No edema.     Left lower leg: No edema.  Lymphadenopathy:     Upper Body:     Right upper body: No supraclavicular, axillary or pectoral adenopathy.     Left upper body: No supraclavicular, axillary or pectoral adenopathy.  Skin:    General: Skin is warm and dry.     Capillary Refill: Capillary refill takes less than 2 seconds.     Coloration: Skin is not jaundiced.     Findings: No bruising.  Neurological:     General: No focal deficit present.     Mental Status: She is alert and oriented to person, place, and time.     Cranial Nerves: No cranial nerve deficit.      Sensory: No sensory deficit.     Motor: No weakness.  Psychiatric:        Mood and Affect: Mood normal.        Behavior: Behavior normal.        Thought Content: Thought content normal.        Judgment: Judgment normal.         Assessment And Plan:     Encounter for annual health examination Assessment & Plan: Behavior modifications discussed and diet history reviewed.   Pt will continue to exercise regularly and modify diet with low GI, plant based foods and decrease intake of processed foods.  Recommend intake of daily multivitamin, Vitamin D, and calcium.  Recommend mammogram and colonoscopy for preventive screenings, as well as recommend immunizations that include  influenza, TDAP, and Shingles    Class 3 severe obesity due to excess calories with serious comorbidity and body mass index (BMI) of 50.0 to 59.9 in adult [facility]) Assessment & Plan: She is encouraged to strive for BMI less than 30 to decrease cardiac risk. Advised to aim for at least 150 minutes of exercise per week.    Type 2 diabetes mellitus with diabetic dermatitis, without long-term current use of insulin (HCC) Assessment & Plan: Hemoglobin A1c is stable.  Continue current medications, tolerating well. I have explained to her the correct way to take her Ozempic to start tomorrow.   Orders: -     POCT URINALYSIS DIP (CLINITEK) -     Microalbumin / creatinine urine ratio  Essential hypertension Assessment & Plan: Blood pressure is well controlled, continue current medications  Orders: -     POCT URINALYSIS DIP (CLINITEK) -     Microalbumin / creatinine urine ratio  Other long term (current) drug therapy  COVID-19 vaccination declined Assessment & Plan: Declines covid 19 vaccine. Discussed risk of covid 73 and if she changes her mind about the vaccine to call the office. Education has been provided regarding the importance of this vaccine but patient still declined. Advised may receive this vaccine  at local pharmacy or Health Dept.or vaccine clinic. Aware to provide a copy of the vaccination record if obtained from local pharmacy or Health Dept.  Encouraged to take multivitamin, vitamin d, vitamin c and zinc to increase immune system. Aware can call office if would like to have vaccine here at office. Verbalized acceptance and understanding.    Need for influenza vaccination Assessment & Plan: Influenza vaccine administered Encouraged to take Tylenol as needed for fever or muscle aches.   Orders: -     Flu vaccine trivalent PF, 6mos and older(Flulaval,Afluria,Fluarix,Fluzone)  Mixed hyperlipidemia Assessment & Plan: Cholesterol levels are stable, continue low fat diet  Orders: -     Lipid panel  Urinary urgency Assessment & Plan: Urinalysis trace blood and positive protein however will send referral to Urology  Orders: -     Ambulatory referral to Urology  Fibromyalgia Assessment & Plan: Stable.   Orders: -     Meloxicam; Take 1 tablet (7.5 mg total) by mouth daily.  Dispense: 90 tablet; Refill: 1  Bilateral primary osteoarthritis of knee Assessment & Plan: Continue f/u with Orthopedics    Return for 1 year physical, 4 month f/u DM and HTN. Patient was given opportunity to ask questions. Patient verbalized understanding of the plan and was able to repeat key elements of the plan. All questions were answered to their satisfaction.   [name], FNP  I, [name], FNP, have reviewed all documentation for this visit. The documentation on 03/14/23 for the exam, diagnosis, procedures, and orders are all accurate and complete.

## 2023-03-14 ENCOUNTER — Encounter: Payer: Self-pay | Admitting: Nurse Practitioner

## 2023-03-14 ENCOUNTER — Ambulatory Visit: Payer: 59 | Admitting: Nurse Practitioner

## 2023-03-14 VITALS — BP 130/78 | HR 90 | Temp 97.7°F | Ht 60.0 in | Wt 287.0 lb

## 2023-03-14 DIAGNOSIS — E11620 Type 2 diabetes mellitus with diabetic dermatitis: Secondary | ICD-10-CM | POA: Diagnosis not present

## 2023-03-14 DIAGNOSIS — M797 Fibromyalgia: Secondary | ICD-10-CM | POA: Diagnosis not present

## 2023-03-14 DIAGNOSIS — Z0000 Encounter for general adult medical examination without abnormal findings: Secondary | ICD-10-CM | POA: Diagnosis not present

## 2023-03-14 DIAGNOSIS — Z23 Encounter for immunization: Secondary | ICD-10-CM | POA: Insufficient documentation

## 2023-03-14 DIAGNOSIS — E782 Mixed hyperlipidemia: Secondary | ICD-10-CM

## 2023-03-14 DIAGNOSIS — E66813 Obesity, class 3: Secondary | ICD-10-CM

## 2023-03-14 DIAGNOSIS — I10 Essential (primary) hypertension: Secondary | ICD-10-CM

## 2023-03-14 DIAGNOSIS — M170 Bilateral primary osteoarthritis of knee: Secondary | ICD-10-CM

## 2023-03-14 DIAGNOSIS — R3915 Urgency of urination: Secondary | ICD-10-CM | POA: Diagnosis not present

## 2023-03-14 DIAGNOSIS — Z2821 Immunization not carried out because of patient refusal: Secondary | ICD-10-CM | POA: Diagnosis not present

## 2023-03-14 DIAGNOSIS — Z79899 Other long term (current) drug therapy: Secondary | ICD-10-CM | POA: Diagnosis not present

## 2023-03-14 DIAGNOSIS — Z6841 Body Mass Index (BMI) 40.0 and over, adult: Secondary | ICD-10-CM

## 2023-03-14 LAB — POCT URINALYSIS DIP (CLINITEK)
Bilirubin, UA: NEGATIVE
Glucose, UA: NEGATIVE mg/dL
Ketones, POC UA: NEGATIVE mg/dL
Leukocytes, UA: NEGATIVE
Nitrite, UA: NEGATIVE
POC PROTEIN,UA: 30 — AB
Spec Grav, UA: 1.02 (ref 1.010–1.025)
Urobilinogen, UA: 0.2 U/dL
pH, UA: 7 (ref 5.0–8.0)

## 2023-03-14 MED ORDER — MELOXICAM 7.5 MG PO TABS: 7.5000 mg | ORAL_TABLET | Freq: Every day | ORAL | 1 refills | Status: DC

## 2023-03-14 NOTE — Patient Instructions (Signed)
Health Maintenance  Topic Date Due   Eye exam for diabetics  Never done   Yearly kidney health urinalysis for diabetes  03/06/2023   COVID-19 Vaccine (4 - 2023-24 season) 03/30/2023*   Complete foot exam   07/05/2023   Hemoglobin A1C  08/22/2023   Mammogram  01/16/2024   Medicare Annual Wellness Visit  02/13/2024   Yearly kidney function blood test for diabetes  02/22/2024   Colon Cancer Screening  04/30/2032   DTaP/Tdap/Td vaccine (3 - Td or Tdap) 11/19/2032   Flu Shot  Completed   Hepatitis C Screening  Completed   HIV Screening  Completed   Zoster (Shingles) Vaccine  Completed   HPV Vaccine  Aged Out  *Topic was postponed. The date shown is not the original due date.

## 2023-03-15 ENCOUNTER — Telehealth: Payer: Self-pay | Admitting: Hematology and Oncology

## 2023-03-15 ENCOUNTER — Encounter: Payer: Self-pay | Admitting: Nurse Practitioner

## 2023-03-16 LAB — MICROALBUMIN / CREATININE URINE RATIO
Creatinine, Urine: 243.9 mg/dL
Microalb/Creat Ratio: 3 mg/g{creat} (ref 0–29)
Microalbumin, Urine: 8.5 ug/mL

## 2023-03-16 LAB — LIPID PANEL
Chol/HDL Ratio: 2.6 ratio (ref 0.0–4.4)
Cholesterol, Total: 206 mg/dL — ABNORMAL HIGH (ref 100–199)
HDL: 79 mg/dL (ref >39)
LDL Chol Calc (NIH): 101 mg/dL — ABNORMAL HIGH (ref 0–99)
Triglycerides: 155 mg/dL — ABNORMAL HIGH (ref 0–149)
VLDL Cholesterol Cal: 26 mg/dL (ref 5–40)

## 2023-03-20 DIAGNOSIS — E782 Mixed hyperlipidemia: Secondary | ICD-10-CM | POA: Insufficient documentation

## 2023-03-20 NOTE — Assessment & Plan Note (Signed)
Cholesterol levels are stable, continue low fat diet

## 2023-03-20 NOTE — Assessment & Plan Note (Signed)
Stable

## 2023-03-20 NOTE — Assessment & Plan Note (Addendum)
Urinalysis trace blood and positive protein however will send referral to Urology

## 2023-03-20 NOTE — Assessment & Plan Note (Signed)

## 2023-03-20 NOTE — Assessment & Plan Note (Signed)
She is encouraged to strive for BMI less than 30 to decrease cardiac risk. Advised to aim for at least 150 minutes of exercise per week.

## 2023-03-20 NOTE — Assessment & Plan Note (Signed)
Blood pressure is well controlled, continue current medications.

## 2023-03-20 NOTE — Assessment & Plan Note (Signed)
Influenza vaccine administered Encouraged to take Tylenol as needed for fever or muscle aches.

## 2023-03-20 NOTE — Assessment & Plan Note (Signed)

## 2023-03-20 NOTE — Assessment & Plan Note (Signed)
Continue f/u with Orthopedics

## 2023-03-20 NOTE — Assessment & Plan Note (Signed)
Hemoglobin A1c is stable.  Continue current medications, tolerating well. I have explained to her the correct way to take her Ozempic to start tomorrow.

## 2023-03-29 ENCOUNTER — Other Ambulatory Visit: Payer: Self-pay | Admitting: Cardiology

## 2023-03-29 DIAGNOSIS — I429 Cardiomyopathy, unspecified: Secondary | ICD-10-CM

## 2023-04-04 DIAGNOSIS — G4733 Obstructive sleep apnea (adult) (pediatric): Secondary | ICD-10-CM | POA: Diagnosis not present

## 2023-04-05 ENCOUNTER — Ambulatory Visit
Admission: RE | Admit: 2023-04-05 | Discharge: 2023-04-05 | Disposition: A | Discharge status: Home / Self Care | Payer: 59 | Source: Ambulatory Visit | Attending: Nurse Practitioner | Admitting: Nurse Practitioner

## 2023-04-05 DIAGNOSIS — Z1231 Encounter for screening mammogram for malignant neoplasm of breast: Secondary | ICD-10-CM

## 2023-04-09 ENCOUNTER — Ambulatory Visit: Payer: 59 | Admitting: Clinical

## 2023-04-10 ENCOUNTER — Other Ambulatory Visit: Payer: Self-pay | Admitting: Physical Medicine and Rehabilitation

## 2023-04-10 ENCOUNTER — Other Ambulatory Visit: Payer: Self-pay | Admitting: Nurse Practitioner

## 2023-04-10 ENCOUNTER — Telehealth: Payer: Self-pay | Admitting: Physician Assistant

## 2023-04-10 NOTE — Telephone Encounter (Signed)
This pt last saw Dr. Lajoyce Corners in July of this year. Can you please call and make and appt at the first available with either Erin or Dr. Lajoyce Corners? Can not refill medication needs appt please

## 2023-04-10 NOTE — Telephone Encounter (Signed)
Pt called requesting a refill of cyclobenzaprine. Pt phone number is [phone]. Please send to [facility] [address]

## 2023-04-12 ENCOUNTER — Inpatient Hospital Stay: Payer: 59 | Attending: Hematology and Oncology | Admitting: Hematology and Oncology

## 2023-04-12 VITALS — BP 126/79 | HR 108 | Temp 97.3°F | Resp 18 | Ht 60.0 in | Wt 282.1 lb

## 2023-04-12 DIAGNOSIS — Z79810 Long term (current) use of selective estrogen receptor modulators (SERMs): Secondary | ICD-10-CM | POA: Diagnosis not present

## 2023-04-12 DIAGNOSIS — M797 Fibromyalgia: Secondary | ICD-10-CM | POA: Diagnosis not present

## 2023-04-12 DIAGNOSIS — Z7951 Long term (current) use of inhaled steroids: Secondary | ICD-10-CM | POA: Diagnosis not present

## 2023-04-12 DIAGNOSIS — Z923 Personal history of irradiation: Secondary | ICD-10-CM | POA: Insufficient documentation

## 2023-04-12 DIAGNOSIS — Z170 Estrogen receptor positive status [ER+]: Secondary | ICD-10-CM | POA: Diagnosis not present

## 2023-04-12 DIAGNOSIS — Z79899 Other long term (current) drug therapy: Secondary | ICD-10-CM | POA: Insufficient documentation

## 2023-04-12 DIAGNOSIS — Z791 Long term (current) use of non-steroidal anti-inflammatories (NSAID): Secondary | ICD-10-CM | POA: Insufficient documentation

## 2023-04-12 DIAGNOSIS — C50212 Malignant neoplasm of upper-inner quadrant of left female breast: Secondary | ICD-10-CM | POA: Insufficient documentation

## 2023-04-12 DIAGNOSIS — R197 Diarrhea, unspecified: Secondary | ICD-10-CM | POA: Diagnosis not present

## 2023-04-12 NOTE — Assessment & Plan Note (Signed)
Left breast invasive ductal carcinoma grade 1 ER 77% PR 96% gases on 5% HER-2 negative, T1 AN0M0 stage I A. status post lumpectomy 2.7 2009 followed by radiation therapy completed 10/10/2007, tamoxifen started September 2009 discontinued March 2012 followed by recurrence in the right breast as DCIS 05/26/2010 completed another lumpectomy and radiation therapy and she has been on tamoxifen ever since.   Insufficient tissue for BCI Completed Tamoxifen July 2017   Breast Cancer Surveillance: Mammogram: 04/08/2023: Benign breast density category C   She lost 30 pounds through dieting and meditation   Severe fibromyalgia related pains    Patient has moved to [place] and will be following at the cancer center in [place] for her follow-ups and mammograms.

## 2023-04-12 NOTE — Progress Notes (Signed)
Patient Care Team: [name], FNP as PCP - General (General Practice)  DIAGNOSIS:  Encounter Diagnosis  Name Primary?   Malignant neoplasm of upper-inner quadrant of left breast in female, estrogen receptor positive (HCC) Yes    SUMMARY OF ONCOLOGIC HISTORY: Oncology History  Breast cancer of upper-inner quadrant of left female breast (HCC)  05/27/2007 Initial Diagnosis   Left breast biopsy: DCIS with calcifications ER/PR 100% positive, MRI revealed 1.7 x 1.4 x 0.9 cm DCIS at 12:00 position left breast   06/20/2007 Surgery   Left breast lumpectomy: T1 N0 M0 stage IA invasive ductal carcinoma 0.4-0.5 cm grade 1, ER 77%, PR 96%, Ki-67 less than 5%, HER-2 negative   08/21/2007 - 10/10/2007 Radiation Therapy   Adjuvant radiation therapy   01/14/2008 - 01/07/2017 Anti-estrogen oral therapy   Tamoxifen 20 mg daily   Breast cancer of lower-outer quadrant of right female breast (HCC)  05/26/2010 Initial Diagnosis   DCIS ER/PR positive   06/16/2010 Surgery   No residual DCIS, extensive fibrocystic changes with focal atypical ductal hyperplasia   08/02/2010 - 09/22/2010 Radiation Therapy   Adjuvant radiation   10/26/2010 - 10/31/2015 Anti-estrogen oral therapy   Tamoxifen 20 mg daily     CHIEF COMPLIANT:   HISTORY OF PRESENT ILLNESS: Discussed the use of AI scribe software for clinical note transcription with the patient, who gave verbal consent to proceed.  History of Present Illness   Ashley Lindsey, a patient with a history of bilateral breast cancer, presents for a routine follow-up. She reports that she is due for bilateral total knee replacement surgery due to severe osteoarthritis, with both knees being "bone to bone." She has been on a weight loss journey and has lost thirteen pounds in a month and a half with the help of Ozempic. She reports some side effects from the medication, including decreased appetite and diarrhea.  Ashley Lindsey also discusses her history of breast cancer. She had  lumpectomies and radiation therapy on both sides, with the left side being treated in 2009 and the right side in 2012. She reports ongoing pain in the left breast, which she attributes to scar tissue. She recently had a mammogram, which did not show any concerning findings.  In addition to her physical health, Ashley Lindsey mentions occasional bouts of depression. She has reduced her dose of venlafaxine to 75mg  at night and reports doing well on this regimen.         ALLERGIES:  is allergic to aspirin and penicillins.  MEDICATIONS:  Current Outpatient Medications  Medication Sig Dispense Refill   acetaminophen (TYLENOL) 650 MG CR tablet Take 650 mg by mouth every 8 (eight) hours as needed for pain.     albuterol (VENTOLIN HFA) 108 (90 Base) MCG/ACT inhaler Inhale 2 puffs into the lungs every 4 (four) hours as needed for wheezing or shortness of breath. 1 each 0   atorvastatin (LIPITOR) 10 MG tablet TAKE 1 TABLET BY MOUTH EVERY DAY AT NIGHT 90 tablet 1   benzonatate (TESSALON) 100 MG capsule Take 1 capsule (100 mg total) by mouth 3 (three) times daily as needed for cough. 21 capsule 0   Bisacodyl (DULCOLAX PO) Take 1 capsule by mouth daily as needed (constipation).      cholecalciferol 5000 units TABS Take 1 tablet (5,000 Units total) by mouth 2 (two) times daily. (Patient taking differently: Take 5,000 Units by mouth daily.)     cyclobenzaprine (FLEXERIL) 10 MG tablet TAKE 1 TABLET BY MOUTH AS NEEDED. 30 tablet 1  dexlansoprazole (DEXILANT) 60 MG capsule TAKE 1 CAPSULE BY MOUTH EVERY DAY 90 capsule 1   diclofenac Sodium (VOLTAREN ARTHRITIS PAIN) 1 % GEL Apply 2 g topically 4 (four) times daily. 100 g 1   diphenhydrAMINE (BENADRYL) 25 MG tablet Take 1 tablet (25 mg total) by mouth every 6 (six) hours as needed for up to 20 doses (migraine). Take with reglan for migraine headache 20 tablet 0   ELDERBERRY PO Take 1 tablet by mouth daily.     ENTRESTO 24-26 MG TAKE 1 TABLET BY MOUTH TWICE A DAY 180 tablet  0   ferrous sulfate 324 (65 Fe) MG TBEC Take 325 mg by mouth 2 (two) times daily.     fish oil-omega-3 fatty acids 1000 MG capsule Take 1 g by mouth daily.     Flaxseed, Linseed, (FLAXSEED OIL MAX STR PO) Take 1 capsule by mouth daily.      furosemide (LASIX) 20 MG tablet Take 1 tablet (20 mg total) by mouth daily as needed. 30 tablet 3   gabapentin (NEURONTIN) 600 MG tablet TAKE 1 TABLET BY MOUTH THREE TIMES A DAY 90 tablet 3   HYDROcodone bit-homatropine (HYDROMET) 5-1.5 MG/5ML syrup Take 5 mLs by mouth every 6 (six) hours as needed for cough. 120 mL 0   meloxicam (MOBIC) 7.5 MG tablet Take 1 tablet (7.5 mg total) by mouth daily. 90 tablet 1   Multiple Vitamins-Minerals (MULTIVITAMIN & MINERAL PO) Take 1 tablet by mouth daily.     mupirocin ointment (BACTROBAN) 2 % Apply 1 Application topically 2 (two) times daily. (Patient taking differently: Apply 1 Application topically as needed.) 22 g 0   ondansetron (ZOFRAN) 4 MG tablet TAKE 1 TABLET BY MOUTH DAILY AS NEEDED FOR NAUSEA OR VOMITING 30 tablet 1   OVER THE COUNTER MEDICATION Take 125 mg by mouth daily. [place]/Simethicone     polyethylene glycol (MIRALAX / GLYCOLAX) 17 g packet Take 17 g by mouth daily as needed.     propranolol (INDERAL) 10 MG tablet TAKE 1 TABLET BY MOUTH TWICE A DAY *HOLD IF SYSTOLIC BP (TOP #) IS <100MMHG OR PULSE IS <60BPM 180 tablet 1   Semaglutide,0.25 or 0.5MG /DOS, 2 MG/3ML SOPN Inject 0.5 mg into the skin once a week. 2 mL 0   spironolactone (ALDACTONE) 25 MG tablet TAKE 1 TABLET BY MOUTH EVERY DAY IN THE MORNING 90 tablet 1   sulfamethoxazole-trimethoprim (BACTRIM DS) 800-160 MG tablet Take 1 tablet by mouth 2 (two) times daily. 10 tablet 0   SYMBICORT 80-4.5 MCG/ACT inhaler INHALE 2 PUFFS INTO THE LUNGS IN THE MORNING AND AT BEDTIME. 30.6 each 1   topiramate (TOPAMAX) 50 MG tablet Take 2 tablets (100 mg total) by mouth at bedtime. 180 tablet 3   triamcinolone cream (KENALOG) 0.1 % APPLY TO AFFECTED AREA (RASH)  TWICE A DAY AS NEEDED 60 g 1   Turmeric 500 MG CAPS Take by mouth.     venlafaxine XR (EFFEXOR-XR) 75 MG 24 hr capsule Take 1 capsule (75 mg total) by mouth in the morning and at bedtime. 180 capsule 1   Vitamin D, Ergocalciferol, (DRISDOL) 1.25 MG (50000 UNIT) CAPS capsule Take 1 capsule (50,000 Units total) by mouth every 7 (seven) days. 20 capsule 0   vitamin E 200 UNIT capsule Take 200 Units by mouth daily.     No current facility-administered medications for this visit.    PHYSICAL EXAMINATION: ECOG PERFORMANCE STATUS: 1 - Symptomatic but completely ambulatory  Vitals:   04/12/23 1110  BP: 126/79  Pulse: (!) 108  Resp: 18  Temp: (!) 97.3 F (36.3 C)  SpO2: 93%   Filed Weights   04/12/23 1110  Weight: 282 lb 1.6 oz (128 kg)    Physical Exam   BREAST: Examination of the left breast reveals scar tissue with underlying muscle palpable, described as hard. No abnormalities detected in breast tissue.      (exam performed in the presence of a chaperone)  LABORATORY DATA:  I have reviewed the data as listed    Latest Ref Rng & Units 02/22/2023   11:17 AM 11/20/2022   11:07 AM 03/29/2022   11:34 AM  CMP  Glucose 70 - 99 mg/dL 81  89    BUN 8 - 27 mg/dL 6  12    Creatinine 8.29 - 1.00 mg/dL 5.62  1.30    Sodium 865 - 144 mmol/L 142  140    Potassium 3.5 - 5.2 mmol/L 4.7  4.8    Chloride 96 - 106 mmol/L 102  100    CO2 20 - 29 mmol/L 27  28    Calcium 8.7 - 10.3 mg/dL 8.9  9.4    Total Protein 6.0 - 8.5 g/dL 6.8   7.4   Total Bilirubin 0.0 - 1.2 mg/dL <7.8   <4.6   Alkaline Phos 44 - 121 IU/L 76   76   AST 0 - 40 IU/L 15   16   ALT 0 - 32 IU/L 11   13     Lab Results  Component Value Date   WBC 9.2 02/22/2023   HGB 11.8 02/22/2023   HCT 39.5 02/22/2023   MCV 85 02/22/2023   PLT 372 02/22/2023   NEUTROABS 5.4 07/05/2022    ASSESSMENT & PLAN:  Breast cancer of upper-inner quadrant of left female breast (HCC) Left breast invasive ductal carcinoma grade 1 ER 77% PR  96% gases on 5% HER-2 negative, T1 AN0M0 stage I A. status post lumpectomy 2.7 2009 followed by radiation therapy completed 10/10/2007, tamoxifen started September 2009 discontinued March 2012 followed by recurrence in the right breast as DCIS 05/26/2010 completed another lumpectomy and radiation therapy and she has been on tamoxifen ever since.   Insufficient tissue for BCI Completed Tamoxifen July 2017   Breast Cancer Surveillance: Mammogram: 04/08/2023: Benign breast density category C Breast exam 04/12/2023: Benign   Severe fibromyalgia related pains  Patient wishes to follow [place] on an annual basis. We will arrange a follow-up in 1 year with Mardella Layman for long-term survivorship.   No orders of the defined types were placed in this encounter.  The patient has a good understanding of the overall plan. she agrees with it. she will call with any problems that may develop before the next visit here. Total time spent: 30 mins including face to face time and time spent for planning, charting and co-ordination of care   Tamsen Meek, MD 04/12/23

## 2023-04-27 ENCOUNTER — Encounter: Payer: Self-pay | Admitting: Nurse Practitioner

## 2023-04-29 ENCOUNTER — Other Ambulatory Visit: Payer: Self-pay

## 2023-04-29 ENCOUNTER — Ambulatory Visit: Payer: 59 | Admitting: Physician Assistant

## 2023-04-29 ENCOUNTER — Telehealth: Payer: Self-pay | Admitting: Radiology

## 2023-04-29 DIAGNOSIS — E11620 Type 2 diabetes mellitus with diabetic dermatitis: Secondary | ICD-10-CM

## 2023-04-29 MED ORDER — SEMAGLUTIDE(0.25 OR 0.5MG/DOS) 2 MG/3ML ~~LOC~~ SOPN: 0.5000 mg | PEN_INJECTOR | SUBCUTANEOUS | 0 refills | Status: DC

## 2023-04-29 NOTE — Telephone Encounter (Signed)
 Spoke with patient about upcoming appointment. Patient was requesting knee injections however I explained it is too soon. She has to wait until February. I offered to reschedule but patient said she would call back, since she was currently busy at the time.  Patient is not interested in gel injections. I did offer to let her keep her scheduled appointment to discuss other possible options for her knees however patient only wanted to come in for injections.   She did request if MaryAnne would be willing to call her in regards to her knees about what she can do until February.

## 2023-05-01 ENCOUNTER — Other Ambulatory Visit: Payer: Self-pay | Admitting: Physician Assistant

## 2023-05-01 ENCOUNTER — Ambulatory Visit: Payer: 59 | Admitting: Physician Assistant

## 2023-05-01 MED ORDER — TRAMADOL HCL 50 MG PO TABS: 50.0000 mg | ORAL_TABLET | Freq: Four times a day (QID) | ORAL | 0 refills | Status: DC | PRN

## 2023-05-09 ENCOUNTER — Other Ambulatory Visit: Payer: Self-pay | Admitting: Physical Medicine and Rehabilitation

## 2023-05-15 ENCOUNTER — Ambulatory Visit: Payer: 59 | Admitting: Physician Assistant

## 2023-05-17 ENCOUNTER — Ambulatory Visit: Payer: 59 | Admitting: Physical Medicine and Rehabilitation

## 2023-05-20 ENCOUNTER — Encounter: Payer: 59 | Admitting: Physical Medicine and Rehabilitation

## 2023-05-20 ENCOUNTER — Telehealth: Payer: Self-pay | Admitting: Physical Medicine and Rehabilitation

## 2023-05-20 NOTE — Telephone Encounter (Signed)
Pt called and stated she is taking gabapentin and has been having frequent flare ups and she needs advice on what to do.

## 2023-05-23 ENCOUNTER — Other Ambulatory Visit: Payer: Self-pay | Admitting: Physician Assistant

## 2023-05-23 ENCOUNTER — Telehealth: Payer: Self-pay | Admitting: Physician Assistant

## 2023-05-23 ENCOUNTER — Ambulatory Visit: Payer: 59 | Admitting: Cardiology

## 2023-05-23 MED ORDER — TRAMADOL HCL 50 MG PO TABS: 50.0000 mg | ORAL_TABLET | Freq: Four times a day (QID) | ORAL | 0 refills | Status: DC | PRN

## 2023-05-23 NOTE — Telephone Encounter (Signed)
Patient called asked for a call back concerning the denial for her getting a Rx refilled for Tramadol. The number to contact patient is [phone]

## 2023-05-28 ENCOUNTER — Encounter: Payer: 59 | Attending: Physical Medicine and Rehabilitation | Admitting: Physical Medicine and Rehabilitation

## 2023-05-28 DIAGNOSIS — E669 Obesity, unspecified: Secondary | ICD-10-CM | POA: Insufficient documentation

## 2023-05-28 DIAGNOSIS — M542 Cervicalgia: Secondary | ICD-10-CM | POA: Diagnosis not present

## 2023-05-28 DIAGNOSIS — M545 Low back pain, unspecified: Secondary | ICD-10-CM | POA: Diagnosis not present

## 2023-05-28 DIAGNOSIS — M797 Fibromyalgia: Secondary | ICD-10-CM | POA: Insufficient documentation

## 2023-05-28 DIAGNOSIS — Z6841 Body Mass Index (BMI) 40.0 and over, adult: Secondary | ICD-10-CM | POA: Insufficient documentation

## 2023-05-28 DIAGNOSIS — R102 Pelvic and perineal pain: Secondary | ICD-10-CM | POA: Insufficient documentation

## 2023-05-28 DIAGNOSIS — E66813 Obesity, class 3: Secondary | ICD-10-CM | POA: Insufficient documentation

## 2023-05-28 DIAGNOSIS — I890 Lymphedema, not elsewhere classified: Secondary | ICD-10-CM | POA: Insufficient documentation

## 2023-05-28 MED ORDER — TOPIRAMATE 25 MG PO TABS: 25.0000 mg | ORAL_TABLET | Freq: Every day | ORAL | 3 refills | Status: DC | PRN

## 2023-05-28 NOTE — Progress Notes (Signed)
 Subjective:    Patient ID: Ashley Lindsey, female    DOB: [birth year], 63 y.o.   MRN: [id number]  HPI An audio/video tele-health visit is felt to be the most appropriate encounter for this patient at this time. This is a follow up tele-visit via phone. The patient is at home. MD is at office. Prior to scheduling this appointment, our staff discussed the limitations of evaluation and management by telemedicine and the availability of in-person appointments. The patient expressed understanding and agreed to proceed.   Ashley Lindsey is a 63 year old woman who presents for f/u of lymphedema, lower extremity pain, and fibromyalgia  1) LUE lymphedema -garment was too expensive -when it flares it gives her no warning -she received a booklet and she does the therapy and sometimes it works and sometimes it doesn't -worse in the biceps -aches and wakes her up  2) Fibromyalgia -ran out of her gabapentin  and she really felt this -working out in [employer] -up and down with emotions -had to put her uncle in a nursing home and this circles her -she is cleaning out her uncle's room -she has had more flares recently.  -has been having flares  3) Lower extremity pain -she sometimes feels that it radiates from her back -has not had any recent spinal imaging -she would be interested in increasing her dose of topamax   4) Obesity -she asks for alternatives to wegivy  5) Headaches: -taking tylenol  but is wary of taking too much  6) Knee pain: -got viscosupplementation -has been in excruciating pain ever since -she notes that increase in topamax  has been helpful -she had an issue with the nutrition referral and requests another  7) Stress due to illness of family members: -she lost two family members recently  8) Caregiver burnout: -she is still taking care of her mom full time  52) Recent pneumonia: -discussed recent pneumonia  10) right arm pain -she is not sure if  this is lymphedema or fibromyalgia -pain radiates from the neck  11) Bilateral knee pain: -getting worse -she was in severe pain on Monday after she had a lot of activity on Sunday   12) Joint pain in both feet  Pain Inventory Average Pain 9 Pain Right Now 9 My pain is constant, dull, and aching  In the last 24 hours, has pain interfered with the following? General activity 9 Relation with others 9 Enjoyment of life 9 What TIME of day is your pain at its worst? varies Sleep (in general) Fair  Pain is worse with: walking, sitting, inactivity, standing, and some activites Pain improves with: rest, heat/ice, therapy/exercise, pacing activities, medication, and injections Relief from Meds: 9  Family History  Problem Relation Age of Onset   Breast cancer Mother 49   Heart disease Father    Diabetes Sister    Prostate cancer Maternal Grandfather 1   Cancer Maternal Aunt 63       d. from female cancer possibly cervical   Breast cancer Maternal Aunt 60   Prostate cancer Maternal Uncle 78   Prostate cancer Maternal Uncle 80   Prostate cancer Paternal Uncle    Prostate cancer Paternal Uncle    Breast cancer Cousin 30       mat 1st cousin   Social History   Socioeconomic History   Marital status: Divorced    Spouse name: Not on file   Number of children: 1   Years of education: Not on file  Highest education level: Not on file  Occupational History   Occupation: disability  Tobacco Use   Smoking status: Former    Current packs/day: 0.25    Average packs/day: 0.3 packs/day for 1 year (0.3 ttl pk-yrs)    Types: Cigarettes   Smokeless tobacco: Never  Vaping Use   Vaping status: Never Used  Substance and Sexual Activity   Alcohol use: Not Currently   Drug use: No   Sexual activity: Not Currently    Birth control/protection: Surgical  Other Topics Concern   Not on file  Social History Narrative   Right handed    Coffee daily, sometimes Ginger ale    Social  Drivers of Health   Financial Resource Strain: Low Risk  (02/13/2023)   Overall Financial Resource Strain (CARDIA)    Difficulty of Paying Living Expenses: Not hard at all  Food Insecurity: No Food Insecurity (02/13/2023)   Hunger Vital Sign    Worried About Running Out of Food in the Last Year: Never true    Ran Out of Food in the Last Year: Never true  Transportation Needs: No Transportation Needs (02/13/2023)   PRAPARE - Administrator, Civil Service (Medical): No    Lack of Transportation (Non-Medical): No  Physical Activity: Inactive (02/13/2023)   Exercise Vital Sign    Days of Exercise per Week: 0 days    Minutes of Exercise per Session: 0 min  Stress: No Stress Concern Present (02/13/2023)   [name] of Occupational Health - Occupational Stress Questionnaire    Feeling of Stress : Not at all  Social Connections: Moderately Integrated (02/13/2023)   Social Connection and Isolation Panel [NHANES]    Frequency of Communication with Friends and Family: More than three times a week    Frequency of Social Gatherings with Friends and Family: More than three times a week    Attends Religious Services: More than 4 times per year    Active Member of Clubs or Organizations: Yes    Attends Banker Meetings: More than 4 times per year    Marital Status: Divorced   Past Surgical History:  Procedure Laterality Date   ABDOMINAL HYSTERECTOMY     with left salpingooophorectomy   BIOPSY  04/30/2022   Procedure: BIOPSY;  Surgeon: Legrand Victory LITTIE DOUGLAS, MD;  Location: [facility];  Service: Gastroenterology;;   BREAST LUMPECTOMY  2009/2012   LEFT/RIGHT with lymph node dissection   COLONOSCOPY WITH PROPOFOL  N/A 04/30/2022   Procedure: COLONOSCOPY WITH PROPOFOL ;  Surgeon: Legrand Victory LITTIE DOUGLAS, MD;  Location: [facility];  Service: Gastroenterology;  Laterality: N/A;   ESOPHAGOGASTRODUODENOSCOPY (EGD) WITH PROPOFOL  N/A 04/30/2022   Procedure: ESOPHAGOGASTRODUODENOSCOPY  (EGD) WITH PROPOFOL ;  Surgeon: Legrand Victory LITTIE DOUGLAS, MD;  Location: [facility];  Service: Gastroenterology;  Laterality: N/A;   HOT HEMOSTASIS N/A 04/30/2022   Procedure: HOT HEMOSTASIS (ARGON PLASMA COAGULATION/BICAP);  Surgeon: Legrand Victory LITTIE DOUGLAS, MD;  Location: [facility];  Service: Gastroenterology;  Laterality: N/A;   KNEE ARTHROSCOPY     right   OOPHORECTOMY  2013   Past Surgical History:  Procedure Laterality Date   ABDOMINAL HYSTERECTOMY     with left salpingooophorectomy   BIOPSY  04/30/2022   Procedure: BIOPSY;  Surgeon: Legrand Victory LITTIE DOUGLAS, MD;  Location: [facility];  Service: Gastroenterology;;   BREAST LUMPECTOMY  2009/2012   LEFT/RIGHT with lymph node dissection   COLONOSCOPY WITH PROPOFOL  N/A 04/30/2022   Procedure: COLONOSCOPY WITH PROPOFOL ;  Surgeon: Legrand Victory  L III, MD;  Location: [facility];  Service: Gastroenterology;  Laterality: N/A;   ESOPHAGOGASTRODUODENOSCOPY (EGD) WITH PROPOFOL  N/A 04/30/2022   Procedure: ESOPHAGOGASTRODUODENOSCOPY (EGD) WITH PROPOFOL ;  Surgeon: Legrand Victory LITTIE DOUGLAS, MD;  Location: [facility];  Service: Gastroenterology;  Laterality: N/A;   HOT HEMOSTASIS N/A 04/30/2022   Procedure: HOT HEMOSTASIS (ARGON PLASMA COAGULATION/BICAP);  Surgeon: Legrand Victory LITTIE DOUGLAS, MD;  Location: [facility];  Service: Gastroenterology;  Laterality: N/A;   KNEE ARTHROSCOPY     right   OOPHORECTOMY  2013   Past Medical History:  Diagnosis Date   Abdominal cramps    Anxiety    Arthritis    breast cancer    s/p radiation- last dose 6/12//has been off Tamoxifen  8/12   Chronic headaches    Costochondritis    Dyspnea 09/21/2014   Fatigue    Fibromyalgia 03/06/2013   GERD (gastroesophageal reflux disease)    History of COVID-19    Hypertension    Lymphedema    RIGHT ARM-////  STATES USE LEFT ARM FOR BP'S   Migraines    Muscle spasms of head and/or neck    following to the breast   Passed out    4th of july   Personal history of radiation therapy 2010   lt  breast    S/P radiation therapy 08/19/07 - 10/10/07   Left Breast/5040 cGy/28 fractions with Boost for a toatl dose of 6300 dGy   S/P radiation therapy 08/14/10 -10/03/10   right breast   Sleep apnea    STOP BANG SCORE 5   There were no vitals taken for this visit.  Opioid Risk Score:   Fall Risk Score:  `1  Depression screen [facility] 2/9     02/13/2023   11:41 AM 12/31/2022   11:39 AM 10/02/2022    1:47 PM 02/08/2022   10:11 AM 01/16/2022    9:14 AM 12/19/2021   10:39 AM 07/06/2021   10:17 AM  Depression screen PHQ 2/9  Decreased Interest 0 2 1 0 0 0 1  Down, Depressed, Hopeless 0 0 1 0 0 0 1  PHQ - 2 Score 0 2 2 0 0 0 2  Altered sleeping  3       Tired, decreased energy  3       Change in appetite  3       Feeling bad or failure about yourself   0       Trouble concentrating  2       Moving slowly or fidgety/restless  0       Suicidal thoughts  0       PHQ-9 Score  13       Difficult doing work/chores  Somewhat difficult          Review of Systems  Musculoskeletal:  Positive for neck pain.       B/L hips knee finger tip pain RT shoulder/arm  All other systems reviewed and are negative.      Objective:   Physical Exam PRIOR EXAM: Gen: no distress, normal appearing HEENT: oral mucosa pink and moist, NCAT Cardio: Reg rate Chest: normal effort, normal rate of breathing Abd: soft, non-distended Ext: no edema Psych: pleasant, normal affect Skin: intact Neuro: Alert and oriented x3 MSK; multiple trigger points     Assessment & Plan:   1) LUE lymphedema -will give script for garment -commended her on doing her lymphedema exercises -continue tiger balm  2) Fibromyalgia -increase gabapentin  to 600mg  TID,  provided refill -prescribed topamax  25mg  daily prn for breakthrough pain -discussed her family stressors -continue tylenol  -continue gabapentin , discussed can double of dose for severe flares.  -Discussed current symptoms of pain and history of pain.  -Discussed  benefits of exercise in reducing pain. -Discussed following foods that may reduce pain: 1) Ginger (especially studied for arthritis)- reduce leukotriene production to decrease inflammation 2) Blueberries- high in phytonutrients that decrease inflammation 3) Salmon- marine omega-3s reduce joint swelling and pain 4) Pumpkin seeds- reduce inflammation 5) dark chocolate- reduces inflammation 6) turmeric- reduces inflammation 7) tart cherries - reduce pain and stiffness 8) extra virgin olive oil - its compound olecanthal helps to block prostaglandins  9) chili peppers- can be eaten or applied topically via capsaicin 10) mint- helpful for headache, muscle aches, joint pain, and itching 11) garlic- reduces inflammation  Link to further information on diet for chronic pain: [email or web address]   3) Right 5th digit DIP OA: -XR ordered  4) Bilateral knee pain -RF and ANA ordered.   5) Obesity: -discussed that stress can cause weight loss -continue topamax  50mg  HS -continue semaglutide  prescribed by different physician -referred to dietician -Educated that current weight is 274 lbs and current BMI is 53.59 Discussed following criteria for Wegovy : 1) Patient has diagnosis of obesity; 2) Patient must be 73 years of age or older; 3) The patient has been involved in a physician or dietitian monitored weight loss program, consisting of both low-calorie diet, increased physical activity and behavioral counseling for a minimum of 6 months without a 3% loss from baseline; 4) Patients BMI is one of the following: a) 30kg/m or greater; b) 27 29.99 kg/m in the presence of at least one weight related comorbidity such as coronary heart disease, dyslipidemia, hypertension, symptomatic arthritis of the lower extremities, type 2 diabetes mellitus, obstructive sleep apnea; c) 25-29.9 kg/m2 and waist circumference is &gt; 40 inches for males or  &gt; 35 inches for females;5) Not Met - Must have tried and failed at least one preferred oral agent such as Phentermine; 6) Patient has no labeled contraindication; 7) Patient is not taking another weight loss medication; 8) The dose is within approved product labeling guidelines; 10) The patient must not be taking another GLP-1 receptor agonist AND the patient must not be taking insulin concurrently.  -prescribed Wegovy  but discussed that this was denied -referred to nutrition -recommended resistance training and prioritizing protein intake to avoid muscle loss while taking this medication -discussed risks of thyroid  cancer and gastroparesis  -Educated regarding health benefits of weight loss- for pain, general health, chronic disease prevention, immune health, mental health.  -Will monitor weight every visit.  -Consider Roobois tea daily.  -Discussed the benefits of intermittent fasting. -Discussed foods that can assist in weight loss: 1) leafy greens- high in fiber and nutrients 2) dark chocolate- improves metabolism (if prefer sweetened, best to sweeten with honey instead of sugar).  3) cruciferous vegetables- high in fiber and protein 4) full fat yogurt: high in healthy fat, protein, calcium , and probiotics 5) apples- high in a variety of phytochemicals 6) nuts- high in fiber and protein that increase feelings of fullness 7) grapefruit: rich in nutrients, antioxidants, and fiber (not to be taken with anticoagulation) 8) beans- high in protein and fiber 9) salmon- has high quality protein and healthy fats 10) green tea- rich in polyphenols 11) eggs- rich in choline and vitamin D  12) tuna- high protein, boosts metabolism 13) avocado- decreases visceral abdominal fat 14) chicken (pasture raised): high in  protein and iron 15) blueberries- reduce abdominal fat and cholesterol 16) whole grains- decreases calories retained during digestion, speeds metabolism 17) chia seeds- curb  appetite 18) chilies- increases fat metabolism  -Discussed supplements that can be used:  1) Metatrim 400mg  BID 30 minutes before breakfast and dinner  2) Sphaeranthus indicus and Garcinia mangostana (combinations of these and #1 can be found in capsicum and zychrome  3) green coffee bean extract 400mg  twice per day or Irvingia (african mango) 150 to 300mg  twice per day.  6) Iron deficiency anemia: -discussed that when she supplemented she felt improved energy.   7) Radicular lumbar pain: -lumbar XR ordered -increase topamax  HS to 50mg   8) Headaches: -discussed that topamax  is usually beneficial for headaches -discussed home sleep apnea testing  9) depression: -discussed that it embarrassed her to have to give up her job -discussed that this continues -discussed that she has to make time for herself -discussed that her mom needs time from her and she may have mild cognitive impairment -discussed that on Sunday she didn't even come out of her room -discussed ketogenic diet  10) Knee pain: -discussed increased pain after viscosupplementation -discussed ketogenic diet -tried to get Wegovy  approved but it was denied -discussed that pain has been severe -discussed that pain is improved with increased topamax  -discussed that she had an issue with nutritional referral, asked staff to check with patient if she would like to be referred to medical weight management  11) Stress due to illness of family members: -discussed how these shook her  26) Caregiver burnout: -discussed that she still takes care of her mom full time  98) History of pneumonia: -continue vitamin D  supplement, Zinc, vitamin C  14) Joint pain in both feet: -discussed that started after she had a lot of activity on Sunday but there are flareups are getting more aggressive than before.  -food allergy testing ordered -recommended natto or nattokinase  15) right arm pain:  Discussed symptoms could be 2/2  fibromyalgia  16) Lower back pain/left hip pain: -Xrs ordered  17) Cervical spine pain: -XR ordered  10 minutes spent in discussion of her lower back pain, her fibromyalgia, her cervical spine pain, XR ordered of her lumbar spine and cervical spine, ordered prn topamax  25mg  daily for breakthrough pain- advised that it may be more beneficial to use this for breakthrough pain than the gabapentin  since the latter can worsen her lower extremity edema

## 2023-05-29 ENCOUNTER — Ambulatory Visit: Payer: 59 | Attending: Cardiology | Admitting: Cardiology

## 2023-05-29 ENCOUNTER — Encounter: Payer: Self-pay | Admitting: Cardiology

## 2023-05-29 VITALS — BP 120/74 | HR 91 | Resp 16 | Ht 60.0 in | Wt 282.0 lb

## 2023-05-29 DIAGNOSIS — Z87891 Personal history of nicotine dependence: Secondary | ICD-10-CM | POA: Diagnosis not present

## 2023-05-29 DIAGNOSIS — I5032 Chronic diastolic (congestive) heart failure: Secondary | ICD-10-CM

## 2023-05-29 DIAGNOSIS — I10 Essential (primary) hypertension: Secondary | ICD-10-CM

## 2023-05-29 DIAGNOSIS — E782 Mixed hyperlipidemia: Secondary | ICD-10-CM

## 2023-05-29 DIAGNOSIS — G4733 Obstructive sleep apnea (adult) (pediatric): Secondary | ICD-10-CM

## 2023-05-29 DIAGNOSIS — Z6841 Body Mass Index (BMI) 40.0 and over, adult: Secondary | ICD-10-CM

## 2023-05-29 DIAGNOSIS — E66813 Obesity, class 3: Secondary | ICD-10-CM

## 2023-05-29 NOTE — Progress Notes (Signed)
 Cardiology Office Note:  .   Date:  05/29/2023  ID:  Ashley Lindsey, DOB 06/16/1960, MRN [id number] PCP:  Georgina Speaks, FNP  Former Cardiology Providers: None [facility] Providers Cardiologist:  Madonna Large, DO , [facility] (established care 11/11/2020) Electrophysiologist:  None  Click to update primary MD,subspecialty MD or APP then REFRESH:1}    Chief Complaint  Patient presents with   Follow-up    Heart failure with improved EF/HFpEF    History of Present Illness: .   Ashley Lindsey is a 63 y.o. African-American female whose past medical history and cardiovascular risk factors includes: Heart failure with improved EF/HFpEF, recovered cardiomyopathy, history of COVID-19 infection, TIA, HTN, OSA, fibromyalgia, history of breast cancer bilaterally status post chemotherapy and radiation, former smoker, obesity due to excess calories.   Patient being followed by the practice given her history of mildly reduced LVEF likely secondary to history of chemotherapy/radiation for bilateral breast cancer.  With up titration of GDMT patient has remained asymptomatic and no recent hospitalizations for congestive heart failure.  She has undergone cardiac MRI since last office visit which notes significant improvement in LVEF which is now within normal limits.  Clinically she denies anginal chest pain or heart failure symptoms.  She has started on BiPAP since last office visit and endorses compliance.  No longer experiences morning headaches.  She is also been started on Ozempic  via the weight loss clinic and has lost approximately 12 pounds according to her.  No structured exercise program or daily routine.  Utilizes a cane for ambulation given her history of falls.  Review of Systems: .   Review of Systems  Constitutional: Positive for weight loss.  Cardiovascular:  Negative for chest pain, claudication, irregular heartbeat, leg swelling, near-syncope, orthopnea, palpitations, paroxysmal  nocturnal dyspnea and syncope.  Respiratory:  Negative for shortness of breath.   Hematologic/Lymphatic: Negative for bleeding problem.    Studies Reviewed:   EKG: EKG Interpretation Date/Time:  Wednesday May 29 2023 10:05:07 EST Ventricular Rate:  93 PR Interval:  140 QRS Duration:  90 QT Interval:  370 QTC Calculation: 460 R Axis:   6  Text Interpretation: Normal sinus rhythm Possible Left atrial enlargement Minimal voltage criteria for LVH, may be normal variant ( R in aVL ) When compared with ECG of 11-Sep-2020 14:26, HEART RATE INCREASED SINCE last tracing Confirmed by Large Madonna [phone]) on 05/29/2023 10:17:57 AM  Echocardiogram: 12/08/2020:  Endocardial definition is not well visualized. This may limit accuracy.  Consider alternate imaging studies, if clinically indicated.   Left  ventricle cavity is normal in size. Mild concentric hypertrophy of the left ventricle. Mild global hypokinesis. LVEF probably 45%. Doppler evidence of grade I (impaired) diastolic dysfunction, normal LAP.  Left atrial cavity is mildly dilated.  Mild (Grade I) mitral regurgitation.  Mild tricuspid regurgitation.  No evidence of pulmonary hypertension.   Stress Test: Lexiscan  Tetrofosmin stress test 12/05/2020: Lexiscan  nuclear stress test performed using 1-day protocol. Mildly decreased tracer uptake in inferior myocardium, likely due to gut attenuation. Stress LVEF 62%. Low risk study.   Cardiac MRI September 2024: 1. Mild RV dilation, within normal limits for age an body surface area   2.  Normal LV function, LVEF 58%.  RADIOLOGY: CTA Head and Neck w/ and w/o contrast: 09/11/2020: 1. CT perfusion negative for acute infarct or ischemia 2. Negative for intracranial large vessel occlusion 3. No significant carotid or vertebral artery stenosis in the neck. No intracranial stenosis. 4. Code stroke imaging  results were communicated on 09/11/2020 at 2:34 pm to provider [place] via text page    CT Head w/o contrast: 09/11/2020 1. Negative CT head 2. ASPECTS is 10 3. Code stroke imaging results were communicated on 09/11/2020 at 1:59 pm to provider [place] via text page   MRI brain with and without contrast: 10/04/2020 1. No specific cause for symptoms.  No acute or subacute infarct. 2. Mild white matter disease without specific demyelinating pattern.  Risk Assessment/Calculations:   N/A   Labs:       Latest Ref Rng & Units 02/22/2023   11:17 AM 07/05/2022   12:15 PM 04/12/2022    4:13 PM  CBC  WBC 3.4 - 10.8 x10E3/uL 9.2  8.9  9.6   Hemoglobin 11.1 - 15.9 g/dL 88.1  87.3  88.9   Hematocrit 34.0 - 46.6 % 39.5  39.3  36.7   Platelets 150 - 450 x10E3/uL 372  351.0  293.0        Latest Ref Rng & Units 02/22/2023   11:17 AM 11/20/2022   11:07 AM 03/05/2022   11:10 AM  BMP  Glucose 70 - 99 mg/dL 81  89  95   BUN 8 - 27 mg/dL 6  12  9    Creatinine 0.57 - 1.00 mg/dL 9.40  9.41  9.28   BUN/Creat Ratio 12 - 28 10  21  13    Sodium 134 - 144 mmol/L 142  140  140   Potassium 3.5 - 5.2 mmol/L 4.7  4.8  4.6   Chloride 96 - 106 mmol/L 102  100  103   CO2 20 - 29 mmol/L 27  28  26    Calcium  8.7 - 10.3 mg/dL 8.9  9.4  9.4       Latest Ref Rng & Units 02/22/2023   11:17 AM 11/20/2022   11:07 AM 03/29/2022   11:34 AM  CMP  Glucose 70 - 99 mg/dL 81  89    BUN 8 - 27 mg/dL 6  12    Creatinine 9.42 - 1.00 mg/dL 9.40  9.41    Sodium 865 - 144 mmol/L 142  140    Potassium 3.5 - 5.2 mmol/L 4.7  4.8    Chloride 96 - 106 mmol/L 102  100    CO2 20 - 29 mmol/L 27  28    Calcium  8.7 - 10.3 mg/dL 8.9  9.4    Total Protein 6.0 - 8.5 g/dL 6.8   7.4   Total Bilirubin 0.0 - 1.2 mg/dL <9.7   <9.7   Alkaline Phos 44 - 121 IU/L 76   76   AST 0 - 40 IU/L 15   16   ALT 0 - 32 IU/L 11   13     Lab Results  Component Value Date   CHOL 206 (H) 03/14/2023   HDL 79 03/14/2023   LDLCALC 101 (H) 03/14/2023   TRIG 155 (H) 03/14/2023   CHOLHDL 2.6 03/14/2023   No results for input(s):  LIPOA in the last 8760 hours. No components found for: NTPROBNP No results for input(s): PROBNP in the last 8760 hours. No results for input(s): TSH in the last 8760 hours.   Physical Exam:    Today's Vitals   05/29/23 1000  BP: 120/74  Pulse: 91  Resp: 16  SpO2: 95%  Weight: 282 lb (127.9 kg)  Height: 5' (1.524 m)   Body mass index is 55.07 kg/m. Wt Readings from Last 3 Encounters:  05/29/23  282 lb (127.9 kg)  04/12/23 282 lb 1.6 oz (128 kg)  03/14/23 287 lb (130.2 kg)    Physical Exam  Constitutional: No distress.  Appears older than stated age, hemodynamically stable, ambulates with a cane.  Neck:  Unable to evaluate JVP due to short neck stature and adipose tissue  Cardiovascular: Normal rate, regular rhythm, S1 normal, S2 normal, intact distal pulses and normal pulses. Exam reveals no gallop, no S3 and no S4.  No murmur heard. Pulmonary/Chest: Effort normal and breath sounds normal. No stridor. She has no wheezes. She has no rales.  Abdominal: Soft. Bowel sounds are normal. She exhibits no distension. There is no abdominal tenderness.  Obese  Musculoskeletal:        General: No edema.     Cervical back: Neck supple.  Neurological: She is alert and oriented to person, place, and time. She has intact cranial nerves (2-12).  Skin: Skin is warm and moist. Clubbing: s.     Impression & Recommendation(s):  Impression:   ICD-10-CM   1. Heart failure with improved ejection fraction (HFimpEF) (HCC)  I50.32 EKG 12-Lead    2. Chronic heart failure with preserved ejection fraction (HCC)  I50.32     3. OSA (obstructive sleep apnea)  G47.33     4. Benign hypertension  I10     5. Mixed hyperlipidemia  E78.2     6. Class 3 severe obesity due to excess calories with serious comorbidity and body mass index (BMI) of 50.0 to 59.9 in adult [facility])  Z33.186    Z68.43    E66.01     7. Former smoker  Z87.891        Recommendation(s):  Heart failure with improved  ejection fraction (HFimpEF) (HCC) Chronic heart failure with preserved ejection fraction (HCC) Stage B, NYHA class II. Echocardiogram LVEF 45%, grade 1 diastolic dysfunction, mild LAE, mild MR/TR. Cardiac MRI September 2024: LVEF 58% Continue With 24/26 mg p.o. twice daily. Continue spironolactone  25 mg p.o. daily. Continue propranolol  10 mg p.o. twice daily with holding parameters. Continue Lasix  20 mg p.o. as needed for swelling  OSA (obstructive sleep apnea) Patient states that she has started BiPAP since last office visit and doing well. No longer experiences morning headaches.  Benign hypertension Office blood pressures are very well-controlled. Reemphasized importance of medication compliance. Low-salt diet. Medications as discussed above  Mixed hyperlipidemia Currently on Lipitor 10 mg p.o. nightly Started on Ozempic  reviewed the weight loss clinic approximately 2 months ago, patient endorses weight loss. Will have repeat lipids checked either with PCP or through the weight loss clinic.  Monitor for now  Class 3 severe obesity due to excess calories with serious comorbidity and body mass index (BMI) of 50.0 to 59.9 in adult [facility]) Body mass index is 55.07 kg/m. I reviewed with her importance of diet, regular physical activity/exercise, weight loss.   Patient is educated on the importance of increasing physical activity gradually as tolerated with a goal of moderate intensity exercise for 30 minutes a day 5 days a week.  Orders Placed:  Orders Placed This Encounter  Procedures   EKG 12-Lead   As part of today's office visit discussed management of at least 2 chronic comorbid conditions, reviewed the results of the cardiac MRI from September 2024, independently reviewed labs from 03/14/2023 via [facility] database, EKG ordered and independently reviewed, discussed management of comorbid conditions, and coordination of care  Final Medication List:   No orders of the defined types  were  placed in this encounter.   Medications Discontinued During This Encounter  Medication Reason   benzonatate  (TESSALON ) 100 MG capsule Patient Preference   Vitamin D , Ergocalciferol , (DRISDOL ) 1.25 MG (50000 UNIT) CAPS capsule Patient Preference     Current Outpatient Medications:    acetaminophen  (TYLENOL ) 650 MG CR tablet, Take 650 mg by mouth every 8 (eight) hours as needed for pain., Disp: , Rfl:    albuterol  (VENTOLIN  HFA) 108 (90 Base) MCG/ACT inhaler, Inhale 2 puffs into the lungs every 4 (four) hours as needed for wheezing or shortness of breath., Disp: 1 each, Rfl: 0   atorvastatin  (LIPITOR) 10 MG tablet, TAKE 1 TABLET BY MOUTH EVERY DAY AT NIGHT, Disp: 90 tablet, Rfl: 1   Bisacodyl (DULCOLAX PO), Take 1 capsule by mouth daily as needed (constipation). , Disp: , Rfl:    cholecalciferol  5000 units TABS, Take 1 tablet (5,000 Units total) by mouth 2 (two) times daily. (Patient taking differently: Take 4,000 Units by mouth daily.), Disp: , Rfl:    cyclobenzaprine  (FLEXERIL ) 10 MG tablet, TAKE 1 TABLET BY MOUTH AS NEEDED., Disp: 30 tablet, Rfl: 1   dexlansoprazole  (DEXILANT ) 60 MG capsule, TAKE 1 CAPSULE BY MOUTH EVERY DAY, Disp: 90 capsule, Rfl: 1   diclofenac  Sodium (VOLTAREN  ARTHRITIS PAIN) 1 % GEL, Apply 2 g topically 4 (four) times daily., Disp: 100 g, Rfl: 1   diphenhydrAMINE  (BENADRYL ) 25 MG tablet, Take 1 tablet (25 mg total) by mouth every 6 (six) hours as needed for up to 20 doses (migraine). Take with reglan  for migraine headache, Disp: 20 tablet, Rfl: 0   ELDERBERRY PO, Take 1 tablet by mouth daily., Disp: , Rfl:    ENTRESTO  24-26 MG, TAKE 1 TABLET BY MOUTH TWICE A DAY, Disp: 180 tablet, Rfl: 0   ferrous sulfate 324 (65 Fe) MG TBEC, Take 325 mg by mouth 2 (two) times daily., Disp: , Rfl:    fish oil-omega-3 fatty acids  1000 MG capsule, Take 1 g by mouth daily., Disp: , Rfl:    Flaxseed, Linseed, (FLAXSEED OIL MAX STR PO), Take 1 capsule by mouth daily. , Disp: , Rfl:     furosemide  (LASIX ) 20 MG tablet, TAKE 1 TABLET BY MOUTH EVERY DAY AS NEEDED, Disp: 90 tablet, Rfl: 1   gabapentin  (NEURONTIN ) 600 MG tablet, TAKE 1 TABLET BY MOUTH THREE TIMES A DAY, Disp: 90 tablet, Rfl: 3   HYDROcodone  bit-homatropine (HYDROMET) 5-1.5 MG/5ML syrup, Take 5 mLs by mouth every 6 (six) hours as needed for cough., Disp: 120 mL, Rfl: 0   meloxicam  (MOBIC ) 7.5 MG tablet, Take 1 tablet (7.5 mg total) by mouth daily., Disp: 90 tablet, Rfl: 1   Multiple Vitamins-Minerals (MULTIVITAMIN & MINERAL PO), Take 1 tablet by mouth daily., Disp: , Rfl:    mupirocin  ointment (BACTROBAN ) 2 %, Apply 1 Application topically 2 (two) times daily. (Patient taking differently: Apply 1 Application topically as needed.), Disp: 22 g, Rfl: 0   ondansetron  (ZOFRAN ) 4 MG tablet, TAKE 1 TABLET BY MOUTH DAILY AS NEEDED FOR NAUSEA OR VOMITING, Disp: 30 tablet, Rfl: 1   OVER THE COUNTER MEDICATION, Take 125 mg by mouth daily. [place]/Simethicone, Disp: , Rfl:    polyethylene glycol (MIRALAX / GLYCOLAX) 17 g packet, Take 17 g by mouth daily as needed., Disp: , Rfl:    propranolol  (INDERAL ) 10 MG tablet, TAKE 1 TABLET BY MOUTH TWICE A DAY *HOLD IF SYSTOLIC BP (TOP #) IS <100MMHG OR PULSE IS <60BPM, Disp: 180 tablet, Rfl: 1  Semaglutide ,0.25 or 0.5MG /DOS, 2 MG/3ML SOPN, Inject 0.5 mg into the skin once a week., Disp: 2 mL, Rfl: 0   spironolactone  (ALDACTONE ) 25 MG tablet, TAKE 1 TABLET BY MOUTH EVERY DAY IN THE MORNING, Disp: 90 tablet, Rfl: 1   sulfamethoxazole -trimethoprim  (BACTRIM  DS) 800-160 MG tablet, Take 1 tablet by mouth 2 (two) times daily., Disp: 10 tablet, Rfl: 0   SYMBICORT  80-4.5 MCG/ACT inhaler, INHALE 2 PUFFS INTO THE LUNGS IN THE MORNING AND AT BEDTIME., Disp: 30.6 each, Rfl: 1   topiramate  (TOPAMAX ) 25 MG tablet, Take 1 tablet (25 mg total) by mouth daily as needed (breakthrough pain)., Disp: 90 tablet, Rfl: 3   traMADol  (ULTRAM ) 50 MG tablet, Take 1 tablet (50 mg total) by mouth every 6 (six) hours as  needed., Disp: 30 tablet, Rfl: 0   triamcinolone  cream (KENALOG ) 0.1 %, APPLY TO AFFECTED AREA (RASH) TWICE A DAY AS NEEDED, Disp: 60 g, Rfl: 1   Turmeric 500 MG CAPS, Take by mouth., Disp: , Rfl:    venlafaxine  XR (EFFEXOR -XR) 75 MG 24 hr capsule, Take 1 capsule (75 mg total) by mouth in the morning and at bedtime., Disp: 180 capsule, Rfl: 1   vitamin E 200 UNIT capsule, Take 200 Units by mouth daily., Disp: , Rfl:   Consent:   NA  Disposition:    1 year follow-up sooner if needed  Her questions and concerns were addressed to her satisfaction. She voices understanding of the recommendations provided during this encounter.    Signed, Madonna Large, DO, [facility]  [facility]  [address] [place], [place] [address] 05/29/2023 10:51 AM

## 2023-05-29 NOTE — Patient Instructions (Signed)
Medication Instructions:  Your physician recommends that you continue on your current medications as directed. Please refer to the Current Medication list given to you today.  *If you need a refill on your cardiac medications before your next appointment, please call your pharmacy*  Lab Work: None ordered today. If you have labs (blood work) drawn today and your tests are completely normal, you will receive your results only by: MyChart Message (if you have MyChart) OR A paper copy in the mail If you have any lab test that is abnormal or we need to change your treatment, we will call you to review the results.  Testing/Procedures: None ordered today.  Follow-Up: At [facility], you and your health needs are our priority.  As part of our continuing mission to provide you with exceptional heart care, we have created designated Provider Care Teams.  These Care Teams include your primary Cardiologist (physician) and Advanced Practice Providers (APPs -  Physician Assistants and Nurse Practitioners) who all work together to provide you with the care you need, when you need it.  Your next appointment:   1 year(s)  The format for your next appointment:   In Person  Provider:   Tessa Lerner, DO {

## 2023-05-30 ENCOUNTER — Other Ambulatory Visit: Payer: Self-pay | Admitting: Nurse Practitioner

## 2023-05-31 DIAGNOSIS — G4733 Obstructive sleep apnea (adult) (pediatric): Secondary | ICD-10-CM | POA: Diagnosis not present

## 2023-06-07 ENCOUNTER — Other Ambulatory Visit: Payer: Self-pay | Admitting: Nurse Practitioner

## 2023-06-07 DIAGNOSIS — E11620 Type 2 diabetes mellitus with diabetic dermatitis: Secondary | ICD-10-CM

## 2023-06-07 DIAGNOSIS — R110 Nausea: Secondary | ICD-10-CM

## 2023-06-18 ENCOUNTER — Encounter: Payer: Self-pay | Admitting: Physical Medicine and Rehabilitation

## 2023-06-18 ENCOUNTER — Encounter (HOSPITAL_BASED_OUTPATIENT_CLINIC_OR_DEPARTMENT_OTHER): Payer: 59 | Admitting: Physical Medicine and Rehabilitation

## 2023-06-18 VITALS — BP 107/74 | HR 95 | Ht 60.0 in | Wt 280.0 lb

## 2023-06-18 DIAGNOSIS — I890 Lymphedema, not elsewhere classified: Secondary | ICD-10-CM | POA: Diagnosis not present

## 2023-06-18 DIAGNOSIS — R102 Pelvic and perineal pain unspecified side: Secondary | ICD-10-CM

## 2023-06-18 DIAGNOSIS — M797 Fibromyalgia: Secondary | ICD-10-CM | POA: Diagnosis not present

## 2023-06-18 DIAGNOSIS — E669 Obesity, unspecified: Secondary | ICD-10-CM | POA: Diagnosis present

## 2023-06-18 DIAGNOSIS — Z6841 Body Mass Index (BMI) 40.0 and over, adult: Secondary | ICD-10-CM | POA: Diagnosis present

## 2023-06-18 DIAGNOSIS — E66813 Obesity, class 3: Secondary | ICD-10-CM | POA: Diagnosis present

## 2023-06-18 DIAGNOSIS — M542 Cervicalgia: Secondary | ICD-10-CM | POA: Diagnosis not present

## 2023-06-18 DIAGNOSIS — M545 Low back pain, unspecified: Secondary | ICD-10-CM | POA: Diagnosis not present

## 2023-06-18 NOTE — Progress Notes (Signed)
 Subjective:    Patient ID: Ashley Lindsey, female    DOB: 04-29-1960, 63 y.o.   MRN: [id number]  HPI  Ashley Lindsey is a 63 year old woman who presents for f/u of lymphedema, lower extremity pain, and fibromyalgia  1) LUE lymphedema -garment was too expensive -when it flares it gives her no warning -she received a booklet and she does the therapy and sometimes it works and sometimes it doesn't -worse in the biceps -aches and wakes her up  2) Fibromyalgia -pain fluctuates -she went to the pool Friday and yesterday and this felt so good -she loves the whirlpool and can stay in there for up to an hour and it rejuvenates her for the rest of the day- she does this Monday/Wednesday/Friday -she does meditation in the sauna and this her prayer time -ran out of her gabapentin and she really felt this -working out in [employer] and [employer] -up and down with emotions -had to put her uncle in a nursing home and this circles her -she is cleaning out her uncle's room -she has had more flares recently.  -has been having flares  3) Lower extremity pain -she sometimes feels that it radiates from her back -has not had any recent spinal imaging -she would be interested in increasing her dose of topamax  4) Obesity -she asks for alternatives to wegivy  5) Headaches: -taking tylenol but is wary of taking too much  6) Knee pain: -got viscosupplementation -has been in excruciating pain ever since -she notes that increase in topamax has been helpful -she had an issue with the nutrition referral and requests another  7) Stress due to illness of family members: -she lost two family members recently  8) Caregiver burnout: -she is still taking care of her mom full time  74) Recent pneumonia: -discussed recent pneumonia  10) right arm pain -she is not sure if this is lymphedema or fibromyalgia -pain radiates from the neck  11) Bilateral knee pain: -getting worse -she was in  severe pain on Monday after she had a lot of activity on Sunday   12) Joint pain in both feet  Pain Inventory Average Pain 10 Pain Right Now 0 My pain is constant and throbbing  In the last 24 hours, has pain interfered with the following? General activity 2 Relation with others 2 Enjoyment of life 2 What TIME of day is your pain at its worst? varies Sleep (in general) Fair  Pain is worse with: walking, standing, and some activites Pain improves with: rest, heat/ice, therapy/exercise, pacing activities, medication, and injections Relief from Meds: 9  Family History  Problem Relation Age of Onset   Breast cancer Mother 80   Heart disease Father    Diabetes Sister    Prostate cancer Maternal Grandfather 61   Cancer Maternal Aunt 63       d. from "female cancer" possibly cervical   Breast cancer Maternal Aunt 60   Prostate cancer Maternal Uncle 78   Prostate cancer Maternal Uncle 80   Prostate cancer Paternal Uncle    Prostate cancer Paternal Uncle    Breast cancer Cousin 30       mat 1st cousin   Social History   Socioeconomic History   Marital status: Divorced    Spouse name: Not on file   Number of children: 1   Years of education: Not on file   Highest education level: Not on file  Occupational History   Occupation: disability  Tobacco Use   Smoking status: Former    Current packs/day: 0.25    Average packs/day: 0.3 packs/day for 1 year (0.3 ttl pk-yrs)    Types: Cigarettes   Smokeless tobacco: Never  Vaping Use   Vaping status: Never Used  Substance and Sexual Activity   Alcohol use: Not Currently   Drug use: No   Sexual activity: Not Currently    Birth control/protection: Surgical  Other Topics Concern   Not on file  Social History Narrative   Right handed    Coffee daily, sometimes Ginger ale    Social Drivers of Health   Financial Resource Strain: Low Risk  (02/13/2023)   Overall Financial Resource Strain (CARDIA)    Difficulty of Paying  Living Expenses: Not hard at all  Food Insecurity: No Food Insecurity (02/13/2023)   Hunger Vital Sign    Worried About Running Out of Food in the Last Year: Never true    Ran Out of Food in the Last Year: Never true  Transportation Needs: No Transportation Needs (02/13/2023)   PRAPARE - Administrator, Civil Service (Medical): No    Lack of Transportation (Non-Medical): No  Physical Activity: Inactive (02/13/2023)   Exercise Vital Sign    Days of Exercise per Week: 0 days    Minutes of Exercise per Session: 0 min  Stress: No Stress Concern Present (02/13/2023)   [name] of Occupational Health - Occupational Stress Questionnaire    Feeling of Stress : Not at all  Social Connections: Moderately Integrated (02/13/2023)   Social Connection and Isolation Panel [NHANES]    Frequency of Communication with Friends and Family: More than three times a week    Frequency of Social Gatherings with Friends and Family: More than three times a week    Attends Religious Services: More than 4 times per year    Active Member of Clubs or Organizations: Yes    Attends Banker Meetings: More than 4 times per year    Marital Status: Divorced   Past Surgical History:  Procedure Laterality Date   ABDOMINAL HYSTERECTOMY     with left salpingooophorectomy   BIOPSY  04/30/2022   Procedure: BIOPSY;  Surgeon: Sherrilyn Rist, MD;  Location: [facility];  Service: Gastroenterology;;   BREAST LUMPECTOMY  2009/2012   LEFT/RIGHT with lymph node dissection   COLONOSCOPY WITH PROPOFOL N/A 04/30/2022   Procedure: COLONOSCOPY WITH PROPOFOL;  Surgeon: Sherrilyn Rist, MD;  Location: [facility];  Service: Gastroenterology;  Laterality: N/A;   ESOPHAGOGASTRODUODENOSCOPY (EGD) WITH PROPOFOL N/A 04/30/2022   Procedure: ESOPHAGOGASTRODUODENOSCOPY (EGD) WITH PROPOFOL;  Surgeon: Sherrilyn Rist, MD;  Location: [facility];  Service: Gastroenterology;  Laterality: N/A;   HOT  HEMOSTASIS N/A 04/30/2022   Procedure: HOT HEMOSTASIS (ARGON PLASMA COAGULATION/BICAP);  Surgeon: Sherrilyn Rist, MD;  Location: [facility];  Service: Gastroenterology;  Laterality: N/A;   KNEE ARTHROSCOPY     right   OOPHORECTOMY  2013   Past Surgical History:  Procedure Laterality Date   ABDOMINAL HYSTERECTOMY     with left salpingooophorectomy   BIOPSY  04/30/2022   Procedure: BIOPSY;  Surgeon: Sherrilyn Rist, MD;  Location: [facility];  Service: Gastroenterology;;   BREAST LUMPECTOMY  2009/2012   LEFT/RIGHT with lymph node dissection   COLONOSCOPY WITH PROPOFOL N/A 04/30/2022   Procedure: COLONOSCOPY WITH PROPOFOL;  Surgeon: Sherrilyn Rist, MD;  Location: [facility];  Service: Gastroenterology;  Laterality: N/A;  ESOPHAGOGASTRODUODENOSCOPY (EGD) WITH PROPOFOL N/A 04/30/2022   Procedure: ESOPHAGOGASTRODUODENOSCOPY (EGD) WITH PROPOFOL;  Surgeon: Sherrilyn Rist, MD;  Location: [facility];  Service: Gastroenterology;  Laterality: N/A;   HOT HEMOSTASIS N/A 04/30/2022   Procedure: HOT HEMOSTASIS (ARGON PLASMA COAGULATION/BICAP);  Surgeon: Sherrilyn Rist, MD;  Location: [facility];  Service: Gastroenterology;  Laterality: N/A;   KNEE ARTHROSCOPY     right   OOPHORECTOMY  2013   Past Medical History:  Diagnosis Date   Abdominal cramps    Anxiety    Arthritis    breast cancer    s/p radiation- last dose 6/12//has been off Tamoxifen 8/12   Chronic headaches    Costochondritis    Dyspnea 09/21/2014   Fatigue    Fibromyalgia 03/06/2013   GERD (gastroesophageal reflux disease)    History of COVID-19    Hypertension    Lymphedema    RIGHT ARM-////  STATES USE LEFT ARM FOR BP'S   Migraines    Muscle spasms of head and/or neck    following to the breast   Passed out    4th of july   Personal history of radiation therapy 2010   lt breast    S/P radiation therapy 08/19/07 - 10/10/07   Left Breast/5040 cGy/28 fractions with Boost for a toatl dose of 6300 dGy    S/P radiation therapy 08/14/10 -10/03/10   right breast   Sleep apnea    STOP BANG SCORE 5   There were no vitals taken for this visit.  Opioid Risk Score:   Fall Risk Score:  `1  Depression screen [facility] 2/9     02/13/2023   11:41 AM 12/31/2022   11:39 AM 10/02/2022    1:47 PM 02/08/2022   10:11 AM 01/16/2022    9:14 AM 12/19/2021   10:39 AM 07/06/2021   10:17 AM  Depression screen PHQ 2/9  Decreased Interest 0 2 1 0 0 0 1  Down, Depressed, Hopeless 0 0 1 0 0 0 1  PHQ - 2 Score 0 2 2 0 0 0 2  Altered sleeping  3       Tired, decreased energy  3       Change in appetite  3       Feeling bad or failure about yourself   0       Trouble concentrating  2       Moving slowly or fidgety/restless  0       Suicidal thoughts  0       PHQ-9 Score  13       Difficult doing work/chores  Somewhat difficult          Review of Systems  Musculoskeletal:  Positive for neck pain.       B/L hips knee finger tip pain RT shoulder/arm  All other systems reviewed and are negative.      Objective:   Physical Exam Gen: no distress, normal appearing HEENT: oral mucosa pink and moist, NCAT Cardio: Reg rate Chest: normal effort, normal rate of breathing Abd: soft, non-distended Ext: no edema Psych: pleasant, normal affect Skin: intact Neuro: Alert and oriented x3 MSK; multiple trigger points, knee TTP, LUE lymphedema     Assessment & Plan:   1) LUE lymphedema -will give script for garment -commended her on doing her lymphedema exercises -continue tiger balm  2) Fibromyalgia -continue gabapentin to 600mg  TID, provided refill -discussed that headaches trigger the flare ups.  -prescribed topamax 25mg   daily prn for breakthrough pain  -discussed mechanism of action of low dose naltrexone as an opioid receptor antagonist which stimulates your body's production of its own natural endogenous opioids, helping to decrease pain. Discussed that it can also decrease T cell response and thus be  helpful in decreasing inflammation, and symptoms of brain fog, fatigue, anxiety, depression, and allergies. Discussed that this medication needs to be compounded at a compounding pharmacy and can more expensive. Discussed that I usually start at 1mg  and if this is not providing enough relief then I titrate upward on a monthly basis.    -discussed potential benefits of metformin  -discussed her family stressors -continue tylenol -continue gabapentin, discussed can double of dose for severe flares.  -Discussed current symptoms of pain and history of pain.  -Discussed benefits of exercise in reducing pain. -Discussed following foods that may reduce pain: 1) Ginger (especially studied for arthritis)- reduce leukotriene production to decrease inflammation 2) Blueberries- high in phytonutrients that decrease inflammation 3) Salmon- marine omega-3s reduce joint swelling and pain 4) Pumpkin seeds- reduce inflammation 5) dark chocolate- reduces inflammation 6) turmeric- reduces inflammation 7) tart cherries - reduce pain and stiffness 8) extra virgin olive oil - its compound olecanthal helps to block prostaglandins  9) chili peppers- can be eaten or applied topically via capsaicin 10) mint- helpful for headache, muscle aches, joint pain, and itching 11) garlic- reduces inflammation  Link to further information on diet for chronic pain: [email or web address]   3) Right 5th digit DIP OA: -XR ordered  4) Bilateral knee pain -RF and ANA ordered.   5) Obesity: -discussed that stress can cause weight loss -continue topamax 50mg  HS -discussed healthy snacks -continue semaglutide prescribed by different physician, discussed that it is supposed to curb her appetite -referred to dietician -encouraged resistance training  -Educated that current weight is 274 lbs and current BMI is 53.59 Discussed following criteria for [facility]: 1)  Patient has diagnosis of obesity; 2) Patient must be 83 years of age or older; 3) The patient has been involved in a physician or dietitian monitored weight loss program, consisting of both low-calorie diet, increased physical activity and behavioral counseling for a minimum of 6 months without a 3% loss from baseline; 4) Patients BMI is one of the following: a) 30kg/m or greater; b) 27 29.99 kg/m in the presence of at least one weight related comorbidity such as coronary heart disease, dyslipidemia, hypertension, symptomatic arthritis of the lower extremities, type 2 diabetes mellitus, obstructive sleep apnea; c) 25-29.9 kg/m2 and waist circumference is &gt; 40 inches for males or &gt; 35 inches for females;5) Not Met - Must have tried and failed at least one preferred oral agent such as Phentermine; 6) Patient has no labeled contraindication; 7) Patient is not taking another weight loss medication; 8) The dose is within approved product labeling guidelines; 10) The patient must not be taking another GLP-1 receptor agonist AND the patient must not be taking insulin concurrently.  -prescribed [id number] but discussed that this was denied -referred to nutrition -recommended resistance training and prioritizing protein intake to avoid muscle loss while taking this medication -discussed risks of thyroid cancer and gastroparesis  -Educated regarding health benefits of weight loss- for pain, general health, chronic disease prevention, immune health, mental health.  -Will monitor weight every visit.  -Consider Roobois tea daily.  -Discussed the benefits of intermittent fasting. -Discussed foods that can assist in weight loss: 1) leafy greens- high in fiber and nutrients 2) dark chocolate- improves metabolism (  if prefer sweetened, best to sweeten with honey instead of sugar).  3) cruciferous vegetables- high in fiber and protein 4) full fat yogurt: high in healthy fat, protein, calcium, and probiotics 5) apples-  high in a variety of phytochemicals 6) nuts- high in fiber and protein that increase feelings of fullness 7) grapefruit: rich in nutrients, antioxidants, and fiber (not to be taken with anticoagulation) 8) beans- high in protein and fiber 9) salmon- has high quality protein and healthy fats 10) green tea- rich in polyphenols 11) eggs- rich in choline and vitamin D 12) tuna- high protein, boosts metabolism 13) avocado- decreases visceral abdominal fat 14) chicken (pasture raised): high in protein and iron 15) blueberries- reduce abdominal fat and cholesterol 16) whole grains- decreases calories retained during digestion, speeds metabolism 17) chia seeds- curb appetite 18) chilies- increases fat metabolism  -Discussed supplements that can be used:  1) Metatrim 400mg  BID 30 minutes before breakfast and dinner  2) Sphaeranthus indicus and Garcinia mangostana (combinations of these and #1 can be found in capsicum and zychrome  3) green coffee bean extract 400mg  twice per day or Irvingia (african mango) 150 to 300mg  twice per day.  6) Iron deficiency anemia: -discussed that when she supplemented she felt improved energy.   7) Radicular lumbar pain: -lumbar XR ordered -increase topamax HS to 50mg   8) Headaches: -discussed that topamax is usually beneficial for headaches -discussed home sleep apnea testing  9) depression: -discussed that it embarrassed her to have to give up her job -discussed that this continues -discussed that she has to make time for herself -discussed that her mom needs time from her and she may have mild cognitive impairment -discussed that on Sunday she didn't even come out of her room -discussed ketogenic diet  10) Knee pain: -discussed increased pain after viscosupplementation -discussed ketogenic diet -tried to get [facility] approved but it was denied -discussed that pain has been severe -discussed that pain is improved with increased topamax -discussed that  she had an issue with nutritional referral, asked staff to check with patient if she would like to be referred to medical weight management  11) Stress due to illness of family members: -discussed how these shook her  8) Caregiver burnout: -discussed that she still takes care of her mom full time  83) History of pneumonia: -continue vitamin D supplement, Zinc, vitamin C  14) Joint pain in both feet: -discussed that started after she had a lot of activity on Sunday but there are flareups are getting more aggressive than before.  -food allergy testing ordered -recommended natto or nattokinase  15) right arm pain:  Discussed symptoms could be 2/2 fibromyalgia  16) Lower back pain/left hip pain: -Xrs ordered  17) Cervical spine pain: -XR ordered  18) Pelvic pain: -discussed following up wit OB/GYN

## 2023-06-26 ENCOUNTER — Encounter (INDEPENDENT_AMBULATORY_CARE_PROVIDER_SITE_OTHER): Payer: Self-pay

## 2023-06-26 ENCOUNTER — Other Ambulatory Visit: Payer: Self-pay | Admitting: Nurse Practitioner

## 2023-06-26 DIAGNOSIS — C50912 Malignant neoplasm of unspecified site of left female breast: Secondary | ICD-10-CM | POA: Diagnosis not present

## 2023-06-26 DIAGNOSIS — I890 Lymphedema, not elsewhere classified: Secondary | ICD-10-CM | POA: Diagnosis not present

## 2023-06-26 DIAGNOSIS — C50911 Malignant neoplasm of unspecified site of right female breast: Secondary | ICD-10-CM | POA: Diagnosis not present

## 2023-06-28 ENCOUNTER — Encounter: Payer: Self-pay | Admitting: Emergency Medicine

## 2023-06-28 DIAGNOSIS — G4733 Obstructive sleep apnea (adult) (pediatric): Secondary | ICD-10-CM | POA: Diagnosis not present

## 2023-07-01 ENCOUNTER — Encounter: Payer: Self-pay | Admitting: Dietician

## 2023-07-01 ENCOUNTER — Encounter: Payer: 59 | Attending: Physical Medicine and Rehabilitation | Admitting: Dietician

## 2023-07-01 ENCOUNTER — Other Ambulatory Visit: Payer: Self-pay | Admitting: Nurse Practitioner

## 2023-07-01 VITALS — Ht 62.0 in | Wt 280.5 lb

## 2023-07-01 DIAGNOSIS — E66813 Obesity, class 3: Secondary | ICD-10-CM | POA: Diagnosis not present

## 2023-07-01 DIAGNOSIS — Z713 Dietary counseling and surveillance: Secondary | ICD-10-CM | POA: Insufficient documentation

## 2023-07-01 DIAGNOSIS — Z6841 Body Mass Index (BMI) 40.0 and over, adult: Secondary | ICD-10-CM | POA: Insufficient documentation

## 2023-07-01 NOTE — Progress Notes (Signed)
 Medical Nutrition Therapy  Appointment Start time:  1110  Appointment End time:  1220  Primary concerns today: Weight Loss  Referral diagnosis: E66.9 - Obesity Preferred learning style: No preference indicated Learning readiness: Change in progress   NUTRITION ASSESSMENT   Anthropometrics Ht: 62" Wt: 280.5 BMI: 51.30 kg/m2 Goal weight: 215 lbs  Clinical Medical Hx: T2DM, HTN, HLD, GERD, Breast cancer, Fibromyalgia Medications: Ozempic, Spironolactone, Atorvastatin, Entresto, Propranolol, Ondansetron, Furosemide (PRN) Labs: A1c - 6.3%, BUN - 6, Cholesterol - 206, TGL - 155, LDL - 101 Notable Signs/Symptoms: Swelling BLE  Lifestyle & Dietary Hx Pt reports taking Ozempic for the last 3 months, had slight GI upset/nausea initially but now only experiences nausea if eating fried foods. Pt reports journaling food since starting Ozempic as well.  Pt reports weight loss of ~8 lbs. Over the last 2 months.  Pt reports desire to learn how to shop, wants to keep healthier foods in the house. Pt states they eat a lot of junk food/snacks (cookies, chips and dip, ice cream, nuts) in the evening while watching TV and will occasionally eat them when stressed. Pt reports living with and caring for their mother, states it can be stressful at times Pt reports doing water aerobics 3x a week for 60 minutes, states they enjoy swimming. Pt reports wanting to get L knee replacement surgery but needs to get weight down to be eligible    Estimated daily fluid intake: 100 oz Supplements: Vit D, Vit E, Elderberry, O3 FA, Iron, MVI, Probiotic, Zinc Sleep: OSA Stress / self-care: High stress Current average weekly physical activity: ADLs, Water aerobics/swims  24-Hr Dietary Recall First Meal: None Snack: Banana Second Meal: none Snack: Gineger Ale Third Meal: Salmon, rice, green beans, mac and cheese, hawaiian roll, water Snack: None Beverages: Water    NUTRITION DIAGNOSIS  NB-1.1 Food and  nutrition-related knowledge deficit As related to obesity.  As evidenced by BMI of 51.30 kg/m2, dietary history high in energy dense foods.   NUTRITION INTERVENTION  Nutrition education (E-1) on the following topics:  Educated patient on the two components of energy balance: Energy in (calories), and energy out (activity). Explain the role of negative energy balance in weight loss. Discussed options with patient to achieve a negative energy balance and how to best control energy in and energy out to accommodate their lifestyle. Educated patient on the importance of consistent protein intake while taking semaglutide.  Handouts Provided Include  AVS  Learning Style & Readiness for Change Teaching method utilized: Visual & Auditory  Demonstrated degree of understanding via: Teach Back  Barriers to learning/adherence to lifestyle change: Junkfood  Goals Established by Pt Work on starting your day with a protein shake (Premiere or Fairlife) within an hour of waking up!  Be sure to get a source of protein at every meal. Choose snack foods with 3 g or more of fiber. You can also take fiber gummies daily! Choose greek yogurt to have with your fruit as a snack or small meal. Keep up the great work doing your water aerobics!!  Look for weight loss at a rate of 1-2 pounds per week!! When shopping, always make list before you go!! When in the store, start by going around the outside before going into the aisles! Look for these phrases on your snack foods "Reduced Fat" "Low Fat" "Fat Free" "Zero sugar" "Reduced Calories" Choose SmartBalance plant based butter in place of regular butter, 2% milk instead of whole milk, ZERO sugar Gingerale  MONITORING & EVALUATION Dietary intake, weekly physical activity, and weight change in 6-8 weeks.  Next Steps  Patient is to follow up with RD.

## 2023-07-01 NOTE — Patient Instructions (Addendum)
 Work on starting your day with a protein shake (Premiere or [place]) within an hour of waking up!  Be sure to get a source of protein at every meal.  Choose snack foods with 3 g or more of fiber. You can also take fiber gummies daily!  Choose greek yogurt to have with your fruit as a snack or small meal.  Keep up the great work doing your water aerobics!!   Look for weight loss at a rate of 1-2 pounds per week!!  When shopping, always make list before you go!! When in the store, start by going around the outside before going into the aisles!  Look for these phrases on your snack foods "Reduced Fat" "Low Fat" "Fat Free" "Zero sugar" "Reduced Calories"  Choose SmartBalance plant based butter in place of regular butter, 2% milk instead of whole milk, ZERO sugar Gingerale

## 2023-07-11 ENCOUNTER — Encounter: Payer: Self-pay | Admitting: Nurse Practitioner

## 2023-07-11 ENCOUNTER — Ambulatory Visit: Payer: 59 | Admitting: Nurse Practitioner

## 2023-07-11 VITALS — BP 140/80 | HR 89 | Temp 98.8°F | Ht 62.0 in | Wt 282.4 lb

## 2023-07-11 DIAGNOSIS — M797 Fibromyalgia: Secondary | ICD-10-CM

## 2023-07-11 DIAGNOSIS — R21 Rash and other nonspecific skin eruption: Secondary | ICD-10-CM | POA: Diagnosis not present

## 2023-07-11 DIAGNOSIS — E782 Mixed hyperlipidemia: Secondary | ICD-10-CM | POA: Diagnosis not present

## 2023-07-11 DIAGNOSIS — E11620 Type 2 diabetes mellitus with diabetic dermatitis: Secondary | ICD-10-CM

## 2023-07-11 DIAGNOSIS — I10 Essential (primary) hypertension: Secondary | ICD-10-CM

## 2023-07-11 DIAGNOSIS — R102 Pelvic and perineal pain: Secondary | ICD-10-CM

## 2023-07-11 DIAGNOSIS — Z2821 Immunization not carried out because of patient refusal: Secondary | ICD-10-CM

## 2023-07-11 MED ORDER — SEMAGLUTIDE (1 MG/DOSE) 4 MG/3ML ~~LOC~~ SOPN
1.0000 mg | PEN_INJECTOR | SUBCUTANEOUS | 1 refills | Status: DC
Start: 2023-07-11 — End: 2023-11-12

## 2023-07-11 NOTE — Progress Notes (Unsigned)
 [name], CMA,acting as a Neurosurgeon for [name], FNP.,have documented all relevant documentation on the behalf of [name], FNP,as directed by  [name], FNP while in the presence of [name], FNP.  Subjective:  Patient ID: Ashley Lindsey , female    DOB: [birth year] , 63 y.o.   MRN: [id number]  Chief Complaint  Patient presents with   Hypertension    HPI  Patient presents today for a bp and dm follow up, Patient reports compliance with medication. Patient denies any chest pain, SOB, or headaches. Patient has a rash on her lower legs,  she reports she has been using OTC creams. Patient also would like to see a GYN for pelvic pain. States that her fibromyalgia is flaring and she is having a lot of pain on the right side of her body, states that she has not been able to swimming for a few days and that makes the pain worst,  She plans has contacted her rheumotologist.    Has skin rash on legs that is healing, used Vaseline and hydrocortisone cream.  Has had increase swelling in feet and legs, is taking lasix as prescribed.    She is now seeing a nutritionist that is helping her manage her fibromyalgia from a nutrition perspective.  Has an appointment with urology next week, feels that her urine has a odor, has been taking azo which it clears it up, but it comes right right back.  States that her weight has been going up ad down.  States that she is using her ozempic as prescribed, her diet has not been consistent but she is working on portion control and staying away from white foods.  She has increased her water intake to 100oz per day. She is not taking her CBGs at home.  She states that she is not eating fried foods, no salt, and eating more seafood. She is also incorporating protein shakes into her diet, due to not being hungry at breakfast time.   States that she has been down because she has had so much pain and not moving as much, and when this occurs she over eats.  Has  been taking cyclobenzapine, gabapentin and tylenol arthritis to manage her pain.      Past Medical History:  Diagnosis Date   Abdominal cramps    Anxiety    Arthritis    breast cancer    s/p radiation- last dose 6/12//has been off Tamoxifen 8/12   Chronic headaches    Costochondritis    Dyspnea 09/21/2014   Fatigue    Fibromyalgia 03/06/2013   GERD (gastroesophageal reflux disease)    History of COVID-19    Hypertension    Lymphedema    RIGHT ARM-////  STATES USE LEFT ARM FOR BP'S   Migraines    Muscle spasms of head and/or neck    following to the breast   Passed out    4th of july   Personal history of radiation therapy 2010   lt breast    S/P radiation therapy 08/19/07 - 10/10/07   Left Breast/5040 cGy/28 fractions with Boost for a toatl dose of 6300 dGy   S/P radiation therapy 08/14/10 -10/03/10   right breast   Sleep apnea    STOP BANG SCORE 5     Family History  Problem Relation Age of Onset   Breast cancer Mother 15   Heart disease Father    Diabetes Sister    Prostate cancer Maternal Grandfather 89  Cancer Maternal Aunt 63       d. from "female cancer" possibly cervical   Breast cancer Maternal Aunt 60   Prostate cancer Maternal Uncle 78   Prostate cancer Maternal Uncle 80   Prostate cancer Paternal Uncle    Prostate cancer Paternal Uncle    Breast cancer Cousin 30       mat 1st cousin     Current Outpatient Medications:    acetaminophen (TYLENOL) 650 MG CR tablet, Take 650 mg by mouth every 8 (eight) hours as needed for pain., Disp: , Rfl:    albuterol (VENTOLIN HFA) 108 (90 Base) MCG/ACT inhaler, Inhale 2 puffs into the lungs every 4 (four) hours as needed for wheezing or shortness of breath., Disp: 1 each, Rfl: 0   atorvastatin (LIPITOR) 10 MG tablet, TAKE 1 TABLET BY MOUTH EVERY DAY AT NIGHT, Disp: 90 tablet, Rfl: 1   Bisacodyl (DULCOLAX PO), Take 1 capsule by mouth daily as needed (constipation). , Disp: , Rfl:    cholecalciferol 5000 units TABS,  Take 1 tablet (5,000 Units total) by mouth 2 (two) times daily. (Patient taking differently: Take 4,000 Units by mouth daily.), Disp: , Rfl:    dexlansoprazole (DEXILANT) 60 MG capsule, TAKE 1 CAPSULE BY MOUTH EVERY DAY, Disp: 90 capsule, Rfl: 1   diclofenac Sodium (VOLTAREN ARTHRITIS PAIN) 1 % GEL, Apply 2 g topically 4 (four) times daily., Disp: 100 g, Rfl: 1   diphenhydrAMINE (BENADRYL) 25 MG tablet, Take 1 tablet (25 mg total) by mouth every 6 (six) hours as needed for up to 20 doses (migraine). Take with reglan for migraine headache, Disp: 20 tablet, Rfl: 0   ELDERBERRY PO, Take 1 tablet by mouth daily., Disp: , Rfl:    ferrous sulfate 324 (65 Fe) MG TBEC, Take 325 mg by mouth 2 (two) times daily., Disp: , Rfl:    fish oil-omega-3 fatty acids 1000 MG capsule, Take 1 g by mouth daily., Disp: , Rfl:    Flaxseed, Linseed, (FLAXSEED OIL MAX STR PO), Take 1 capsule by mouth daily. , Disp: , Rfl:    furosemide (LASIX) 20 MG tablet, TAKE 1 TABLET BY MOUTH EVERY DAY AS NEEDED, Disp: 90 tablet, Rfl: 1   meloxicam (MOBIC) 7.5 MG tablet, Take 1 tablet (7.5 mg total) by mouth daily., Disp: 90 tablet, Rfl: 1   Multiple Vitamins-Minerals (MULTIVITAMIN & MINERAL PO), Take 1 tablet by mouth daily., Disp: , Rfl:    mupirocin ointment (BACTROBAN) 2 %, Apply 1 Application topically 2 (two) times daily. (Patient taking differently: Apply 1 Application topically as needed.), Disp: 22 g, Rfl: 0   ondansetron (ZOFRAN) 4 MG tablet, TAKE 1 TABLET BY MOUTH DAILY AS NEEDED FOR NAUSEA OR VOMITING, Disp: 30 tablet, Rfl: 1   OVER THE COUNTER MEDICATION, Take 125 mg by mouth daily. [place]/Simethicone, Disp: , Rfl:    polyethylene glycol (MIRALAX / GLYCOLAX) 17 g packet, Take 17 g by mouth daily as needed., Disp: , Rfl:    propranolol (INDERAL) 10 MG tablet, TAKE 1 TABLET BY MOUTH TWICE A DAY *HOLD IF SYSTOLIC BP (TOP #) IS <100MMHG OR PULSE IS <60BPM, Disp: 180 tablet, Rfl: 1   Semaglutide, 1 MG/DOSE, 4 MG/3ML SOPN, Inject 1  mg into the skin once a week., Disp: 9 mL, Rfl: 1   spironolactone (ALDACTONE) 25 MG tablet, TAKE 1 TABLET BY MOUTH EVERY DAY IN THE MORNING, Disp: 90 tablet, Rfl: 1   sulfamethoxazole-trimethoprim (BACTRIM DS) 800-160 MG tablet, Take 1 tablet by  mouth 2 (two) times daily., Disp: 10 tablet, Rfl: 0   SYMBICORT 80-4.5 MCG/ACT inhaler, INHALE 2 PUFFS INTO THE LUNGS IN THE MORNING AND AT BEDTIME., Disp: 30.6 each, Rfl: 1   topiramate (TOPAMAX) 25 MG tablet, Take 1 tablet (25 mg total) by mouth daily as needed (breakthrough pain)., Disp: 90 tablet, Rfl: 3   traMADol (ULTRAM) 50 MG tablet, Take 1 tablet (50 mg total) by mouth every 6 (six) hours as needed., Disp: 30 tablet, Rfl: 0   triamcinolone cream (KENALOG) 0.1 %, APPLY TO AFFECTED AREA (RASH) TWICE A DAY AS NEEDED, Disp: 60 g, Rfl: 1   Turmeric 500 MG CAPS, Take by mouth., Disp: , Rfl:    vitamin E 200 UNIT capsule, Take 200 Units by mouth daily., Disp: , Rfl:    cyclobenzaprine (FLEXERIL) 10 MG tablet, TAKE 1 TABLET BY MOUTH EVERY DAY AS NEEDED, Disp: 30 tablet, Rfl: 1   gabapentin (NEURONTIN) 600 MG tablet, TAKE 1 TABLET BY MOUTH THREE TIMES A DAY, Disp: 90 tablet, Rfl: 3   sacubitril-valsartan (ENTRESTO) 24-26 MG, Take 1 tablet by mouth 2 (two) times daily., Disp: 180 tablet, Rfl: 3   venlafaxine XR (EFFEXOR-XR) 75 MG 24 hr capsule, TAKE 1 CAPSULE (75 MG TOTAL) BY MOUTH IN THE MORNING AND AT BEDTIME., Disp: 180 capsule, Rfl: 1   Allergies  Allergen Reactions   Aspirin     unknown   Penicillins Hives and Swelling    Has patient had a PCN reaction causing immediate rash, facial/tongue/throat swelling, SOB or lightheadedness with hypotension: Yes Has patient had a PCN reaction causing severe rash involving mucus membranes or skin necrosis: Yes Has patient had a PCN reaction that required hospitalization Yes Has patient had a PCN reaction occurring within the last 10 years: No If all of the above answers are "NO", then may proceed with  Cephalosporin use.      Review of Systems  Constitutional:  Positive for activity change (decrease activity due to pain).  HENT: Negative.    Eyes: Negative.   Respiratory: Negative.    Cardiovascular: Negative.   Gastrointestinal: Negative.   Endocrine: Negative.   Genitourinary:  Positive for pelvic pain (hips, left greater than right).  Musculoskeletal:  Positive for arthralgias (left sided pain) and myalgias (left sided pain).  Skin:  Positive for rash (bilateral lower legs).  Neurological: Negative.   Hematological: Negative.   Psychiatric/Behavioral: Negative.    All other systems reviewed and are negative.    Today's Vitals   07/11/23 1112  BP: (!) 140/80  Pulse: 89  Temp: 98.8 F (37.1 C)  TempSrc: Oral  Weight: 282 lb 6.4 oz (128.1 kg)  Height: 5\' 2"  (1.575 m)  PainSc: 10-Worst pain ever  PainLoc: Arm   Body mass index is 51.65 kg/m.  Wt Readings from Last 3 Encounters:  07/11/23 282 lb 6.4 oz (128.1 kg)  07/01/23 280 lb 8 oz (127.2 kg)  06/18/23 280 lb (127 kg)     Objective:  Physical Exam Vitals reviewed.  Constitutional:      Appearance: Normal appearance. She is obese.  HENT:     Head: Normocephalic and atraumatic.     Right Ear: External ear normal.     Left Ear: External ear normal.     Nose: Nose normal.     Mouth/Throat:     Mouth: Mucous membranes are moist.     Pharynx: Oropharynx is clear.  Eyes:     Conjunctiva/sclera: Conjunctivae normal.  Pupils: Pupils are equal, round, and reactive to light.  Pulmonary:     Effort: Pulmonary effort is normal.  Musculoskeletal:        General: Swelling (left arm) present.     Cervical back: Normal range of motion.     Right hip: Tenderness present.     Left hip: Tenderness present.  Skin:    General: Skin is warm and dry.     Findings: Rash (bilateral lower legs) present.  Neurological:     General: No focal deficit present.     Mental Status: She is alert and oriented to person, place,  and time.  Psychiatric:        Attention and Perception: Attention normal.        Mood and Affect: Mood is depressed.        Speech: Speech normal.        Behavior: Behavior normal.        Thought Content: Thought content normal.        Cognition and Memory: Cognition normal.        Judgment: Judgment normal.         Assessment And Plan:  Essential hypertension Assessment & Plan: Blood pressure is slightly elevated, she is advised to focus on lifestyle modifications. continue current medications  Orders: -     Basic metabolic panel with GFR  Type 2 diabetes mellitus with diabetic dermatitis, without long-term current use of insulin (HCC) Assessment & Plan: Increasing ozempic, continue to see nutritionist.  Increase activity, continue to avoid carbs, sugars, and processed fats.    Orders: -     Hemoglobin A1c -     Semaglutide (1 MG/DOSE); Inject 1 mg into the skin once a week.  Dispense: 9 mL; Refill: 1  Mixed hyperlipidemia Assessment & Plan: Cholesterol levels are stable, continue low fat diet  Orders: -     Lipid panel  Rash Assessment & Plan: Continues to have rash to bilateral lower extremities, continue steroid cream   Pelvic pain Assessment & Plan: She continues to have pelvic pain and she would like to see a GYN in spite of the fact she has had a hysterectomy. Will make referral.   Orders: -     Ambulatory referral to Obstetrics / Gynecology  Fibromyalgia Assessment & Plan: She feels like she has been having a flare.    COVID-19 vaccination declined Assessment & Plan: Declines covid 19 vaccine. Discussed risk of covid 37 and if she changes her mind about the vaccine to call the office. Education has been provided regarding the importance of this vaccine but patient still declined. Advised may receive this vaccine at local pharmacy or Health Dept.or vaccine clinic. Aware to provide a copy of the vaccination record if obtained from local pharmacy or Health  Dept.  Encouraged to take multivitamin, vitamin d, vitamin c and zinc to increase immune system. Aware can call office if would like to have vaccine here at office. Verbalized acceptance and understanding.      Return for controlled DM check 4 months.  Patient was given opportunity to ask questions. Patient verbalized understanding of the plan and was able to repeat key elements of the plan. All questions were answered to their satisfaction.   I have reviewed this encounter including the documentation in this note and/or discussed this patient with Ezra Sites FNP, student. I am certifying that I agree with the content of this note as the primary care nurse practitioner.  [name], DNP,  FNP-BC  I, [name], FNP, have reviewed all documentation for this visit. The documentation on 07/11/23 for the exam, diagnosis, procedures, and orders are all accurate and complete.   IF YOU HAVE BEEN REFERRED TO A SPECIALIST, IT MAY TAKE 1-2 WEEKS TO SCHEDULE/PROCESS THE REFERRAL. IF YOU HAVE NOT HEARD FROM US/SPECIALIST IN TWO WEEKS, PLEASE GIVE [place] A CALL AT [phone].

## 2023-07-11 NOTE — Assessment & Plan Note (Signed)
 Increasing ozempic, continue to see nutritionist.  Increase activity, continue to avoid carbs, sugars, and processed fats.

## 2023-07-12 ENCOUNTER — Encounter: Payer: Self-pay | Admitting: Physical Medicine and Rehabilitation

## 2023-07-12 ENCOUNTER — Encounter: Payer: Self-pay | Admitting: Nurse Practitioner

## 2023-07-12 LAB — BASIC METABOLIC PANEL
BUN/Creatinine Ratio: 18 (ref 12–28)
BUN: 10 mg/dL (ref 8–27)
CO2: 27 mmol/L (ref 20–29)
Calcium: 9.1 mg/dL (ref 8.7–10.3)
Chloride: 101 mmol/L (ref 96–106)
Creatinine, Ser: 0.55 mg/dL — ABNORMAL LOW (ref 0.57–1.00)
Glucose: 76 mg/dL (ref 70–99)
Potassium: 4.6 mmol/L (ref 3.5–5.2)
Sodium: 142 mmol/L (ref 134–144)
eGFR: 102 mL/min/{1.73_m2} (ref >59)

## 2023-07-12 LAB — LIPID PANEL
Chol/HDL Ratio: 2.6 ratio (ref 0.0–4.4)
Cholesterol, Total: 193 mg/dL (ref 100–199)
HDL: 73 mg/dL (ref >39)
LDL Chol Calc (NIH): 101 mg/dL — ABNORMAL HIGH (ref 0–99)
Triglycerides: 106 mg/dL (ref 0–149)
VLDL Cholesterol Cal: 19 mg/dL (ref 5–40)

## 2023-07-12 LAB — HEMOGLOBIN A1C
Est. average glucose Bld gHb Est-mCnc: 123 mg/dL
Hgb A1c MFr Bld: 5.9 % — ABNORMAL HIGH (ref 4.8–5.6)

## 2023-07-13 ENCOUNTER — Other Ambulatory Visit: Payer: Self-pay | Admitting: Hematology and Oncology

## 2023-07-17 DIAGNOSIS — R350 Frequency of micturition: Secondary | ICD-10-CM | POA: Diagnosis not present

## 2023-07-18 ENCOUNTER — Other Ambulatory Visit: Payer: Self-pay

## 2023-07-18 ENCOUNTER — Other Ambulatory Visit: Payer: Self-pay | Admitting: Nurse Practitioner

## 2023-07-18 ENCOUNTER — Other Ambulatory Visit: Payer: Self-pay | Admitting: Physical Medicine and Rehabilitation

## 2023-07-18 DIAGNOSIS — I429 Cardiomyopathy, unspecified: Secondary | ICD-10-CM

## 2023-07-18 MED ORDER — ENTRESTO 24-26 MG PO TABS: 1.0000 | ORAL_TABLET | Freq: Two times a day (BID) | ORAL | 3 refills | Status: AC

## 2023-07-19 ENCOUNTER — Ambulatory Visit (INDEPENDENT_AMBULATORY_CARE_PROVIDER_SITE_OTHER): Admitting: Physician Assistant

## 2023-07-19 DIAGNOSIS — M170 Bilateral primary osteoarthritis of knee: Secondary | ICD-10-CM | POA: Diagnosis not present

## 2023-07-19 DIAGNOSIS — M1711 Unilateral primary osteoarthritis, right knee: Secondary | ICD-10-CM

## 2023-07-19 DIAGNOSIS — M1712 Unilateral primary osteoarthritis, left knee: Secondary | ICD-10-CM

## 2023-07-19 MED ORDER — METHYLPREDNISOLONE ACETATE 40 MG/ML IJ SUSP
40.0000 mg | INTRAMUSCULAR | Status: AC | PRN
  Administered 2023-07-19: 40 mg via INTRA_ARTICULAR

## 2023-07-19 MED ORDER — LIDOCAINE HCL 1 % IJ SOLN
3.0000 mL | INTRAMUSCULAR | Status: AC | PRN
  Administered 2023-07-19: 3 mL

## 2023-07-19 NOTE — Progress Notes (Signed)
 Office Visit Note   Patient: Ashley Lindsey           Date of Birth: 10-09-1960           MRN: [id number] Visit Date: 07/19/2023              Requested by: [name], FNP [address] [place],  [place] [address] PCP: [name], FNP  No chief complaint on file.     HPI: Kimbley is a pleasant 63 year old woman with a history of osteoarthritis of her knees.  She has been successfully working on weight loss.  She periodically comes in for injections into her knees.  She has tried gel injections in the past which made her worse.  She is requesting injections into her knees today.  Assessment & Plan: Visit Diagnoses:  1. Unilateral primary osteoarthritis, right knee   2. Unilateral primary osteoarthritis, left knee     Plan: Went forward with bilateral knee injections may follow-up with me as needed  Follow-Up Instructions: Return if symptoms worsen or fail to improve.   Ortho Exam  Patient is alert, oriented, no adenopathy, well-dressed, normal affect, normal respiratory effort. Bilateral knees no effusion no erythema she has varus alignment compartments are soft and nontender  Imaging: No results found. No images are attached to the encounter.  Labs: Lab Results  Component Value Date   HGBA1C 5.9 (H) 07/11/2023   HGBA1C 6.3 (H) 02/22/2023   HGBA1C 6.3 (H) 11/20/2022   ESRSEDRATE 48 (H) 08/26/2019   LABURIC 4.8 08/26/2019   REPTSTATUS 06/12/2017 FINAL 06/10/2017   CULT >=100,000 COLONIES/mL ESCHERICHIA COLI (A) 06/10/2017   LABORGA ESCHERICHIA COLI (A) 06/10/2017     Lab Results  Component Value Date   ALBUMIN 4.1 02/22/2023   ALBUMIN 4.3 03/29/2022   ALBUMIN 4.2 03/05/2022    Lab Results  Component Value Date   MG 1.9 01/16/2022   MG 2.0 02/17/2020   Lab Results  Component Value Date   VD25OH 51.5 11/20/2022   VD25OH 41.6 01/16/2022   VD25OH 32 12/26/2011    No results found for: "PREALBUMIN"    Latest Ref Rng & Units 02/22/2023    11:17 AM 07/05/2022   12:15 PM 04/12/2022    4:13 PM  CBC EXTENDED  WBC 3.4 - 10.8 x10E3/uL 9.2  8.9  9.6   RBC 3.77 - 5.28 x10E6/uL 4.63  4.81  4.83   Hemoglobin 11.1 - 15.9 g/dL 62.1  30.8  65.7   HCT 34.0 - 46.6 % 39.5  39.3  36.7   Platelets 150 - 450 x10E3/uL 372  351.0  293.0   NEUT# 1.4 - 7.7 K/uL  5.4  6.2   Lymph# 0.7 - 4.0 K/uL  2.7  2.6      There is no height or weight on file to calculate BMI.  Orders:  No orders of the defined types were placed in this encounter.  No orders of the defined types were placed in this encounter.    Procedures: Large Joint Inj on 07/19/2023 10:55 AM Indications: pain and diagnostic evaluation Details: 25 G 1.5 in needle, anteromedial approach  Arthrogram: No  Medications: 40 mg methylPREDNISolone acetate 40 MG/ML; 3 mL lidocaine 1 % Outcome: tolerated well, no immediate complications Consent was given by the patient.    Clinical Data: No additional findings.  ROS:  All other systems negative, except as noted in the HPI. Review of Systems  Objective: Vital Signs: There were no vitals taken for  this visit.  Specialty Comments:  No specialty comments available.  PMFS History: Patient Active Problem List   Diagnosis Date Noted  . Mixed hyperlipidemia 03/20/2023  . Urinary urgency 03/14/2023  . Encounter for annual health examination 03/14/2023  . COVID-19 vaccination declined 03/14/2023  . Class 3 severe obesity due to excess calories with serious comorbidity and body mass index (BMI) of 50.0 to 59.9 in adult [facility]) 03/14/2023  . Need for influenza vaccination 03/14/2023  . Acute cough 02/20/2023  . Right sided abdominal pain 02/19/2023  . Hematuria 02/19/2023  . Abnormal urine odor 12/31/2022  . Cervicogenic headache 12/31/2022  . Urinary frequency 11/28/2022  . Urinary tract infection without hematuria 11/28/2022  . Elevated cholesterol 11/20/2022  . Vitamin D deficiency 11/20/2022  . Tibialis posterior  tendinitis 10/08/2022  . OSA (obstructive sleep apnea) 09/04/2022  . Claustrophobia 09/04/2022  . Suboptimal nutrition (HCC) 07/05/2022  . Cardiomyopathy, unspecified type (HCC) 07/05/2022  . Essential hypertension 07/05/2022  . Type 2 diabetes mellitus with diabetic dermatitis, without long-term current use of insulin (HCC) 07/05/2022  . Chronic gastric ulcer without hemorrhage and without perforation 04/30/2022  . Gastric AVM 04/30/2022  . Iron deficiency anemia 03/06/2022  . Unilateral primary osteoarthritis, left knee 10/02/2021  . Pulmonary nodules 07/12/2021  . Chronic cough 07/12/2021  . Avulsion fracture of middle phalanx of finger 04/18/2021  . Tibial plateau fracture, left 04/18/2021  . Asymmetrical sensorineural hearing loss 02/02/2021  . Bilateral primary osteoarthritis of knee 02/09/2020  . Chronic intermittent hypoxia with obstructive sleep apnea 08/24/2019  . Chronic intractable headache 07/14/2019  . Morning headache 07/14/2019  . Loud snoring 07/14/2019  . Super obesity 07/14/2019  . Sleeps in sitting position due to orthopnea 07/14/2019  . Chest pain due to GERD 07/14/2019  . Nocturia more than twice per night 07/14/2019  . Unilateral primary osteoarthritis, right knee 05/26/2019  . Chronic migraine without aura without status migrainosus, not intractable 05/13/2019  . Class 3 severe obesity due to excess calories without serious comorbidity with body mass index (BMI) of 40.0 to 44.9 in adult (HCC) 08/05/2018  . Depression 08/05/2018  . Chronic nonintractable headache 08/05/2018  . Chronic pain of right ankle 04/24/2018  . Breast cancer of lower-outer quadrant of right female breast (HCC) 12/02/2014  . Bronchospasm 09/21/2014  . Chronic pain of right knee 09/21/2014  . Fibromyalgia 03/06/2013  . Dense breasts 03/06/2013  . Mastalgia 03/06/2013  . Back pain, lumbosacral 03/06/2013  . Lymphedema of arm 03/06/2013  . Atypical chest pain 01/06/2013  . Breast  cancer of upper-inner quadrant of left female breast (HCC) 09/08/2012  . Family history of malignant neoplasm of breast 09/08/2012  . S/P radiation therapy   . History of breast cancer in female 08/13/2011   Past Medical History:  Diagnosis Date  . Abdominal cramps   . Anxiety   . Arthritis   . breast cancer    s/p radiation- last dose 6/12//has been off Tamoxifen 8/12  . Chronic headaches   . Costochondritis   . Dyspnea 09/21/2014  . Fatigue   . Fibromyalgia 03/06/2013  . GERD (gastroesophageal reflux disease)   . History of COVID-19   . Hypertension   . Lymphedema    RIGHT ARM-////  STATES USE LEFT ARM FOR BP'S  . Migraines   . Muscle spasms of head and/or neck    following to the breast  . Passed out    4th of july  . Personal history of radiation therapy 2010  lt breast   . S/P radiation therapy 08/19/07 - 10/10/07   Left Breast/5040 cGy/28 fractions with Boost for a toatl dose of 6300 dGy  . S/P radiation therapy 08/14/10 -10/03/10   right breast  . Sleep apnea    STOP BANG SCORE 5    Family History  Problem Relation Age of Onset  . Breast cancer Mother 37  . Heart disease Father   . Diabetes Sister   . Prostate cancer Maternal Grandfather 72  . Cancer Maternal Aunt 63       d. from "female cancer" possibly cervical  . Breast cancer Maternal Aunt 60  . Prostate cancer Maternal Uncle 78  . Prostate cancer Maternal Uncle 80  . Prostate cancer Paternal Uncle   . Prostate cancer Paternal Uncle   . Breast cancer Cousin 30       mat 1st cousin    Past Surgical History:  Procedure Laterality Date  . ABDOMINAL HYSTERECTOMY     with left salpingooophorectomy  . BIOPSY  04/30/2022   Procedure: BIOPSY;  Surgeon: Sherrilyn Rist, MD;  Location: [facility];  Service: Gastroenterology;;  . BREAST LUMPECTOMY  2009/2012   LEFT/RIGHT with lymph node dissection  . COLONOSCOPY WITH PROPOFOL N/A 04/30/2022   Procedure: COLONOSCOPY WITH PROPOFOL;  Surgeon: Sherrilyn Rist, MD;  Location: [facility];  Service: Gastroenterology;  Laterality: N/A;  . ESOPHAGOGASTRODUODENOSCOPY (EGD) WITH PROPOFOL N/A 04/30/2022   Procedure: ESOPHAGOGASTRODUODENOSCOPY (EGD) WITH PROPOFOL;  Surgeon: Sherrilyn Rist, MD;  Location: [facility];  Service: Gastroenterology;  Laterality: N/A;  . HOT HEMOSTASIS N/A 04/30/2022   Procedure: HOT HEMOSTASIS (ARGON PLASMA COAGULATION/BICAP);  Surgeon: Sherrilyn Rist, MD;  Location: [facility];  Service: Gastroenterology;  Laterality: N/A;  . KNEE ARTHROSCOPY     right  . OOPHORECTOMY  2013   Social History   Occupational History  . Occupation: disability  Tobacco Use  . Smoking status: Former    Current packs/day: 0.25    Average packs/day: 0.3 packs/day for 1 year (0.3 ttl pk-yrs)    Types: Cigarettes  . Smokeless tobacco: Never  Vaping Use  . Vaping status: Never Used  Substance and Sexual Activity  . Alcohol use: Not Currently  . Drug use: No  . Sexual activity: Not Currently    Birth control/protection: Surgical

## 2023-07-22 ENCOUNTER — Encounter: Payer: Self-pay | Admitting: Nurse Practitioner

## 2023-07-22 DIAGNOSIS — R21 Rash and other nonspecific skin eruption: Secondary | ICD-10-CM | POA: Insufficient documentation

## 2023-07-22 DIAGNOSIS — R102 Pelvic and perineal pain unspecified side: Secondary | ICD-10-CM | POA: Insufficient documentation

## 2023-07-22 NOTE — Assessment & Plan Note (Signed)
 She continues to have pelvic pain and she would like to see a GYN in spite of the fact she has had a hysterectomy. Will make referral.

## 2023-07-22 NOTE — Assessment & Plan Note (Signed)

## 2023-07-22 NOTE — Assessment & Plan Note (Signed)
 She feels like she has been having a flare.

## 2023-07-22 NOTE — Assessment & Plan Note (Signed)
 Continues to have rash to bilateral lower extremities, continue steroid cream

## 2023-07-22 NOTE — Assessment & Plan Note (Signed)
 Cholesterol levels are stable, continue low fat diet

## 2023-07-22 NOTE — Assessment & Plan Note (Signed)
 Blood pressure is slightly elevated, she is advised to focus on lifestyle modifications. continue current medications

## 2023-07-23 ENCOUNTER — Telehealth: Payer: Self-pay | Admitting: Registered Nurse

## 2023-07-23 ENCOUNTER — Encounter: Payer: Self-pay | Admitting: Physical Medicine and Rehabilitation

## 2023-07-23 DIAGNOSIS — M797 Fibromyalgia: Secondary | ICD-10-CM

## 2023-07-23 MED ORDER — GABAPENTIN 600 MG PO TABS: 600.0000 mg | ORAL_TABLET | Freq: Four times a day (QID) | ORAL | 3 refills | Status: DC

## 2023-07-23 NOTE — Telephone Encounter (Signed)
 Placed a call to Ms. Jernigan, regarding her My Chart message.  PMP was Reviewed.  Gabapentin increased to 4 times a day, she verbalizes understanding.

## 2023-07-26 ENCOUNTER — Other Ambulatory Visit: Payer: Self-pay | Admitting: Cardiology

## 2023-07-26 DIAGNOSIS — I429 Cardiomyopathy, unspecified: Secondary | ICD-10-CM

## 2023-07-29 DIAGNOSIS — G4733 Obstructive sleep apnea (adult) (pediatric): Secondary | ICD-10-CM | POA: Diagnosis not present

## 2023-08-16 ENCOUNTER — Other Ambulatory Visit: Payer: Self-pay | Admitting: Physician Assistant

## 2023-08-16 MED ORDER — DICLOFENAC SODIUM 1 % EX GEL: 2.0000 g | Freq: Four times a day (QID) | CUTANEOUS | 1 refills | Status: AC

## 2023-08-16 NOTE — Telephone Encounter (Signed)
 Sure.  Ashley Lindsey to pharmacy on file

## 2023-08-16 NOTE — Telephone Encounter (Signed)
 No need to come in and can actually get otc

## 2023-08-22 ENCOUNTER — Encounter: Payer: Self-pay | Admitting: Nurse Practitioner

## 2023-08-22 ENCOUNTER — Telehealth: Payer: Self-pay | Admitting: Neurology

## 2023-08-22 NOTE — Telephone Encounter (Signed)
 Pt LVM to reschedule APPT. Called Pt and R/S APPT.

## 2023-08-29 ENCOUNTER — Other Ambulatory Visit: Payer: Self-pay | Admitting: Nurse Practitioner

## 2023-08-29 DIAGNOSIS — I10 Essential (primary) hypertension: Secondary | ICD-10-CM

## 2023-08-29 DIAGNOSIS — E782 Mixed hyperlipidemia: Secondary | ICD-10-CM

## 2023-08-29 DIAGNOSIS — M797 Fibromyalgia: Secondary | ICD-10-CM

## 2023-08-29 DIAGNOSIS — E11620 Type 2 diabetes mellitus with diabetic dermatitis: Secondary | ICD-10-CM

## 2023-08-29 DIAGNOSIS — Z6841 Body Mass Index (BMI) 40.0 and over, adult: Secondary | ICD-10-CM

## 2023-08-30 ENCOUNTER — Telehealth: Payer: Self-pay | Admitting: *Deleted

## 2023-08-30 NOTE — Progress Notes (Signed)
 Complex Care Management Note Care Guide Note  08/30/2023 Name: TAYLEIGH PASSWATER MRN: [id number] DOB: Jul 05, 1960   Complex Care Management Outreach Attempts: An unsuccessful telephone outreach was attempted today to offer the patient information about available complex care management services.  Follow Up Plan:  Additional outreach attempts will be made to offer the patient complex care management information and services.   Encounter Outcome:  No Answer  Barnie Bora  [facility]  [facility], [facility] Guide  Direct Dial: [phone]  Fax [phone]

## 2023-09-02 ENCOUNTER — Ambulatory Visit: Admitting: Dietician

## 2023-09-03 ENCOUNTER — Ambulatory Visit: Admitting: Neurology

## 2023-09-09 NOTE — Progress Notes (Signed)
 Complex Care Management Note  Care Guide Note 09/09/2023 Name: Ashley Lindsey MRN: [id number] DOB: 07/04/1960  Ashley Lindsey is a 63 y.o. year old female who sees Susanna Epley, FNP for primary care. I reached out to Berry Bristol by phone today to offer complex care management services.  [name] was given information about Complex Care Management services today including:   The Complex Care Management services include support from the care team which includes your Nurse Care Manager, Clinical Social Worker, or Pharmacist.  The Complex Care Management team is here to help remove barriers to the health concerns and goals most important to you. Complex Care Management services are voluntary, and the patient may decline or stop services at any time by request to their care team member.   Complex Care Management Consent Status: Patient agreed to services and verbal consent obtained.   Follow up plan:  Telephone appointment with complex care management team member scheduled for:  6/17  Encounter Outcome:  Patient Scheduled  Barnie Bora  [facility]  [facility], [facility] Guide  Direct Dial: [phone]  Fax [phone]

## 2023-09-13 ENCOUNTER — Encounter: Payer: Self-pay | Admitting: Nurse Practitioner

## 2023-09-13 ENCOUNTER — Other Ambulatory Visit: Payer: Self-pay | Admitting: Nurse Practitioner

## 2023-09-26 ENCOUNTER — Other Ambulatory Visit: Payer: Self-pay | Admitting: Cardiology

## 2023-09-26 DIAGNOSIS — I429 Cardiomyopathy, unspecified: Secondary | ICD-10-CM

## 2023-09-30 ENCOUNTER — Other Ambulatory Visit: Payer: Self-pay | Admitting: Nurse Practitioner

## 2023-09-30 ENCOUNTER — Ambulatory Visit: Admitting: Dietician

## 2023-10-01 ENCOUNTER — Ambulatory Visit: Admitting: Physician Assistant

## 2023-10-02 ENCOUNTER — Other Ambulatory Visit: Payer: Self-pay | Admitting: Physical Medicine and Rehabilitation

## 2023-10-02 ENCOUNTER — Other Ambulatory Visit: Payer: Self-pay | Admitting: Nurse Practitioner

## 2023-10-02 DIAGNOSIS — M797 Fibromyalgia: Secondary | ICD-10-CM

## 2023-10-08 ENCOUNTER — Telehealth: Payer: Self-pay

## 2023-10-08 NOTE — Progress Notes (Signed)
 Complex Care Management Care Guide Note  10/08/2023 Name: Ashley Lindsey MRN: [id number] DOB: 1961/02/09  Ashley Lindsey is a 63 y.o. year old female who is a primary care patient of Susanna Epley, FNP and is actively engaged with the care management team. I reached out to Berry Bristol by phone today to assist with re-scheduling  with the RN Case Manager.  Follow up plan: Unsuccessful telephone outreach attempt made. A HIPAA compliant phone message was left for the patient providing contact information and requesting a return call.  Creola Doheny [facility], [facility] Guide  Direct Dial: [phone]  Fax [phone]

## 2023-10-09 NOTE — Progress Notes (Signed)
 Complex Care Management Note Care Guide Note  10/09/2023 Name: Ashley Lindsey MRN: [id number] DOB: [birth year]   Complex Care Management Outreach Attempts: A second unsuccessful outreach was attempted today to offer the patient with information about available complex care management services.  Follow Up Plan:  No further outreach attempts will be made at this time. We have been unable to contact the patient to offer or enroll patient in complex care management services.  Encounter Outcome:  No Answer  Creola Doheny [facility], [facility] Guide  Direct Dial: [phone]  Fax [phone]

## 2023-10-11 NOTE — Progress Notes (Signed)
 Complex Care Management Care Guide Note  10/11/2023 Name: Ashley Lindsey MRN: [id number] DOB: 09-Mar-1961  Ashley Lindsey is a 63 y.o. year old female who is a primary care patient of Susanna Epley, FNP and is actively engaged with the care management team. I reached out to Berry Bristol by phone today to assist with re-scheduling  with the RN Case Manager.  Follow up plan: Unsuccessful telephone outreach attempt made. A HIPAA compliant phone message was left for the patient providing contact information and requesting a return call.  Creola Doheny [facility], [facility] Guide  Direct Dial: [phone]  Fax [phone]

## 2023-10-15 ENCOUNTER — Other Ambulatory Visit: Payer: Self-pay | Admitting: Physical Medicine and Rehabilitation

## 2023-10-15 ENCOUNTER — Telehealth: Payer: Self-pay

## 2023-10-15 ENCOUNTER — Encounter: Payer: 59 | Attending: Physical Medicine and Rehabilitation | Admitting: Physical Medicine and Rehabilitation

## 2023-10-15 ENCOUNTER — Encounter: Payer: Self-pay | Admitting: Physical Medicine and Rehabilitation

## 2023-10-15 VITALS — BP 131/89 | HR 90 | Ht 62.0 in | Wt 271.8 lb

## 2023-10-15 DIAGNOSIS — E66813 Obesity, class 3: Secondary | ICD-10-CM | POA: Diagnosis not present

## 2023-10-15 DIAGNOSIS — M797 Fibromyalgia: Secondary | ICD-10-CM | POA: Insufficient documentation

## 2023-10-15 DIAGNOSIS — M25579 Pain in unspecified ankle and joints of unspecified foot: Secondary | ICD-10-CM | POA: Insufficient documentation

## 2023-10-15 DIAGNOSIS — L309 Dermatitis, unspecified: Secondary | ICD-10-CM | POA: Diagnosis not present

## 2023-10-15 MED ORDER — JOURNAVX 50 MG PO TABS: 1.0000 | ORAL_TABLET | Freq: Two times a day (BID) | ORAL | 0 refills | Status: DC | PRN

## 2023-10-15 MED ORDER — JOURNAVX 50 MG PO TABS: 1.0000 | ORAL_TABLET | Freq: Two times a day (BID) | ORAL | 0 refills | Status: AC | PRN

## 2023-10-15 NOTE — Addendum Note (Signed)
 Addended by: LORILEE SVEN SQUIBB on: 10/15/2023 10:46 AM   Modules accepted: Orders

## 2023-10-15 NOTE — Telephone Encounter (Signed)
 Pt Journavx 50mg  prescription failed to send. Please advise

## 2023-10-15 NOTE — Progress Notes (Addendum)
 Subjective:    Patient ID: Ashley Lindsey, female    DOB: 08/13/1960, 63 y.o.   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 -she cannot work a lot of hours due to her disability -she reads her patients the Bible, watches movies with them -today she is having a flare up -pain fluctuates -she went to the pool Friday and yesterday and this felt so good -she loves the whirlpool and can stay in there for up to an hour and it rejuvenates her for the rest of the day- she does this Monday/Wednesday/Friday -she does meditation in the sauna and this her prayer time -ran out of her gabapentin  and she really felt this -working out in [employer] and [employer] -up and down with emotions -had to put her uncle in a nursing home and this circles her -she is cleaning out her uncle's room -she has had more flares recently.  -has been having flares  3) Lower extremity pain -she sometimes feels that it radiates from her back -has not had any recent spinal imaging -she would be interested in increasing her dose of topamax   4) Obesity -she asks for alternatives to wegivy  5) Headaches: -taking tylenol  but is wary of taking too much  6) Knee pain: -got viscosupplementation -has been in excruciating pain ever since -she notes that increase in topamax  has been helpful -she had an issue with the nutrition referral and requests another  7) Stress due to illness of family members: -she lost two family members  recently  8) Caregiver burnout: -she is still taking care of her mom full time  58) Recent pneumonia: -discussed recent pneumonia  10) right arm pain -she is not sure if this is lymphedema or fibromyalgia -pain radiates from the neck  11) Bilateral knee pain: -getting worse -she was in severe pain on Monday after she had a lot of activity on Sunday   12) Joint pain in both feet -worsens with walking -now present in her hand and arms -has difficulty putting her sleeves on as it cuts off her blood ciruclation in right arm -she called her nurse at the cancer center to see if her lymphedema can be remeasured  Pain Inventory Average Pain 10 Pain Right Now 0 My pain is constant and throbbing  In the last 24 hours, has pain interfered with the following? General activity 5 Relation with others 0 Enjoyment of life 0 What TIME of day is your pain at its worst? morning , daytime, evening, night, and varies Sleep (in general) Fair  Pain is worse with: walking, bending, standing, and some activites Pain improves with: therapy/exercise, pacing activities, medication, and injections Relief from Meds: 6  Family History  Problem Relation Age of Onset   Breast cancer Mother 22   Heart disease Father    Diabetes Sister    Prostate cancer Maternal Grandfather 71   Cancer Maternal Aunt 63       d. from female cancer  possibly cervical   Breast cancer Maternal Aunt 60   Prostate cancer Maternal Uncle 78   Prostate cancer Maternal Uncle 80   Prostate cancer Paternal Uncle    Prostate cancer Paternal Uncle    Breast cancer Cousin 30       mat 1st cousin   Social History   Socioeconomic History   Marital status: Divorced    Spouse name: Not on file   Number of children: 1   Years of education: Not on file   Highest education level: Not on file  Occupational History   Occupation: disability  Tobacco Use   Smoking status: Former    Current packs/day: 0.25    Average  packs/day: 0.3 packs/day for 1 year (0.3 ttl pk-yrs)    Types: Cigarettes   Smokeless tobacco: Never  Vaping Use   Vaping status: Never Used  Substance and Sexual Activity   Alcohol use: Not Currently   Drug use: No   Sexual activity: Not Currently    Birth control/protection: Surgical  Other Topics Concern   Not on file  Social History Narrative   Right handed    Coffee daily, sometimes Ginger ale    Social Drivers of Health   Financial Resource Strain: Low Risk  (02/13/2023)   Overall Financial Resource Strain (CARDIA)    Difficulty of Paying Living Expenses: Not hard at all  Food Insecurity: No Food Insecurity (02/13/2023)   Hunger Vital Sign    Worried About Running Out of Food in the Last Year: Never true    Ran Out of Food in the Last Year: Never true  Transportation Needs: No Transportation Needs (02/13/2023)   PRAPARE - Administrator, Civil Service (Medical): No    Lack of Transportation (Non-Medical): No  Physical Activity: Inactive (02/13/2023)   Exercise Vital Sign    Days of Exercise per Week: 0 days    Minutes of Exercise per Session: 0 min  Stress: No Stress Concern Present (02/13/2023)   [name] of Occupational Health - Occupational Stress Questionnaire    Feeling of Stress : Not at all  Social Connections: Moderately Integrated (02/13/2023)   Social Connection and Isolation Panel    Frequency of Communication with Friends and Family: More than three times a week    Frequency of Social Gatherings with Friends and Family: More than three times a week    Attends Religious Services: More than 4 times per year    Active Member of Clubs or Organizations: Yes    Attends Banker Meetings: More than 4 times per year    Marital Status: Divorced   Past Surgical History:  Procedure Laterality Date   ABDOMINAL HYSTERECTOMY     with left salpingooophorectomy   BIOPSY  04/30/2022   Procedure: BIOPSY;  Surgeon: Legrand Victory LITTIE DOUGLAS, MD;   Location: [facility];  Service: Gastroenterology;;   BREAST LUMPECTOMY  2009/2012   LEFT/RIGHT with lymph node dissection   COLONOSCOPY WITH PROPOFOL  N/A 04/30/2022   Procedure: COLONOSCOPY WITH PROPOFOL ;  Surgeon: Legrand Victory LITTIE DOUGLAS, MD;  Location: [facility];  Service: Gastroenterology;  Laterality: N/A;   ESOPHAGOGASTRODUODENOSCOPY (EGD) WITH PROPOFOL  N/A 04/30/2022   Procedure: ESOPHAGOGASTRODUODENOSCOPY (EGD) WITH PROPOFOL ;  Surgeon: Legrand Victory LITTIE DOUGLAS, MD;  Location: [facility];  Service: Gastroenterology;  Laterality: N/A;   HOT HEMOSTASIS N/A 04/30/2022   Procedure: HOT HEMOSTASIS (ARGON PLASMA COAGULATION/BICAP);  Surgeon: Legrand Victory LITTIE DOUGLAS, MD;  Location: [facility];  Service: Gastroenterology;  Laterality:  N/A;   KNEE ARTHROSCOPY     right   OOPHORECTOMY  2013   Past Surgical History:  Procedure Laterality Date   ABDOMINAL HYSTERECTOMY     with left salpingooophorectomy   BIOPSY  04/30/2022   Procedure: BIOPSY;  Surgeon: Legrand Victory LITTIE DOUGLAS, MD;  Location: [facility];  Service: Gastroenterology;;   BREAST LUMPECTOMY  2009/2012   LEFT/RIGHT with lymph node dissection   COLONOSCOPY WITH PROPOFOL  N/A 04/30/2022   Procedure: COLONOSCOPY WITH PROPOFOL ;  Surgeon: Legrand Victory LITTIE DOUGLAS, MD;  Location: [facility];  Service: Gastroenterology;  Laterality: N/A;   ESOPHAGOGASTRODUODENOSCOPY (EGD) WITH PROPOFOL  N/A 04/30/2022   Procedure: ESOPHAGOGASTRODUODENOSCOPY (EGD) WITH PROPOFOL ;  Surgeon: Legrand Victory LITTIE DOUGLAS, MD;  Location: [facility];  Service: Gastroenterology;  Laterality: N/A;   HOT HEMOSTASIS N/A 04/30/2022   Procedure: HOT HEMOSTASIS (ARGON PLASMA COAGULATION/BICAP);  Surgeon: Legrand Victory LITTIE DOUGLAS, MD;  Location: [facility];  Service: Gastroenterology;  Laterality: N/A;   KNEE ARTHROSCOPY     right   OOPHORECTOMY  2013   Past Medical History:  Diagnosis Date   Abdominal cramps    Anxiety    Arthritis    breast cancer    s/p radiation- last dose 6/12//has been off  Tamoxifen  8/12   Chronic headaches    Costochondritis    Dyspnea 09/21/2014   Fatigue    Fibromyalgia 03/06/2013   GERD (gastroesophageal reflux disease)    History of COVID-19    Hypertension    Lymphedema    RIGHT ARM-////  STATES USE LEFT ARM FOR BP'S   Migraines    Muscle spasms of head and/or neck    following to the breast   Passed out    4th of july   Personal history of radiation therapy 2010   lt breast    S/P radiation therapy 08/19/07 - 10/10/07   Left Breast/5040 cGy/28 fractions with Boost for a toatl dose of 6300 dGy   S/P radiation therapy 08/14/10 -10/03/10   right breast   Sleep apnea    STOP BANG SCORE 5   BP 131/89 (BP Location: Left Arm, Patient Position: Sitting)   Pulse 90   Ht 5' 2 (1.575 m)   Wt 271 lb 12.8 oz (123.3 kg)   SpO2 92%   BMI 49.71 kg/m   Opioid Risk Score:   Fall Risk Score:  `1  Depression screen [facility] 2/9     10/15/2023   10:27 AM 07/11/2023   11:53 AM 06/18/2023    9:49 AM 02/13/2023   11:41 AM 12/31/2022   11:39 AM 10/02/2022    1:47 PM 02/08/2022   10:11 AM  Depression screen PHQ 2/9  Decreased Interest 0 2 0 0 2 1 0  Down, Depressed, Hopeless 0 0 0 0 0 1 0  PHQ - 2 Score 0 2 0 0 2 2 0  Altered sleeping  2   3    Tired, decreased energy  2   3    Change in appetite  2   3    Feeling bad or failure about yourself   0   0    Trouble concentrating  0   2    Moving slowly or fidgety/restless  0   0    Suicidal thoughts  0   0    PHQ-9 Score  8   13    Difficult doing work/chores  Somewhat difficult   Somewhat difficult       Review of  Systems  Musculoskeletal:  Positive for neck pain.       B/L hips knee finger tip pain RT shoulder/arm  All other systems reviewed and are negative.      Objective:   Physical Exam Gen: no distress, normal appearing HEENT: oral mucosa pink and moist, NCAT Cardio: Reg rate Chest: normal effort, normal rate of breathing Abd: soft, non-distended Ext: no edema Psych: pleasant, normal  affect Skin: intact Neuro: Alert and oriented x3 MSK; multiple trigger points, knee TTP, LUE lymphedema, diffuse joint tenderness     Assessment & Plan:   1) LUE lymphedema -will give script for garment -commended her on doing her lymphedema exercises -continue tiger balm  2) Fibromyalgia -continue gabapentin  to 600mg  TID, provided refill -discussed that headaches trigger the flare ups.  -prescribed topamax  25mg  daily prn for breakthrough pain  -discussed that Journavx is a highly selective inhibitor for Nav 1.8, which is specific for pain in the peripheral nervous system, discussed that lidocaine  in contrast affects all Nav receptors, discussed that patient can try using this medication as prn for pain, though its studies have focused on its use for acute pain. Discussed that it has been studied against opioids for acute pain with comparable efficacy. Discussed that I have not seen any patient side effects thus far. Discussed that we have samples available and we have copay cards available. Discussed that outpatient if the medication requires a prior auth the copay should be $30 for at least a 60 day supply. The medication may be more likely to be in stock in [facility] and [facility]. We do have samples available.   -discussed mechanism of action of low dose naltrexone as an opioid receptor antagonist which stimulates your body's production of its own natural endogenous opioids, helping to decrease pain. Discussed that it can also decrease T cell response and thus be helpful in decreasing inflammation, and symptoms of brain fog, fatigue, anxiety, depression, and allergies. Discussed that this medication needs to be compounded at a compounding pharmacy and can more expensive. Discussed that I usually start at 1mg  and if this is not providing enough relief then I titrate upward on a monthly basis.    -discussed potential benefits of metformin  -discussed her family stressors -continue  tylenol  -continue gabapentin , discussed can double of dose for severe flares.  -Discussed current symptoms of pain and history of pain.  -Discussed benefits of exercise in reducing pain. -Discussed following foods that may reduce pain: 1) Ginger (especially studied for arthritis)- reduce leukotriene production to decrease inflammation 2) Blueberries- high in phytonutrients that decrease inflammation 3) Salmon- marine omega-3s reduce joint swelling and pain 4) Pumpkin seeds- reduce inflammation 5) dark chocolate- reduces inflammation 6) turmeric- reduces inflammation 7) tart cherries - reduce pain and stiffness 8) extra virgin olive oil - its compound olecanthal helps to block prostaglandins  9) chili peppers- can be eaten or applied topically via capsaicin 10) mint- helpful for headache, muscle aches, joint pain, and itching 11) garlic- reduces inflammation  Link to further information on diet for chronic pain: http://www.bray.com/   3) Right 5th digit DIP OA: -XR ordered  4) Bilateral knee pain -RF and ANA ordered.   5) Obesity: -continue Ozempic  -discussed that she has lost a lot of weight -discussed that she is concerned that the eczema she is experiencing is a side effect of the Ozempic  -discussed that stress can cause weight loss -continue topamax  50mg  HS -discussed healthy snacks -continue semaglutide  prescribed by different physician, discussed that it  is supposed to curb her appetite -referred to dietician -encouraged resistance training  -Educated that current weight is 274 lbs and current BMI is 53.59 Discussed following criteria for Wegovy : 1) Patient has diagnosis of obesity; 2) Patient must be 62 years of age or older; 3) The patient has been involved in a physician or dietitian monitored weight loss program, consisting of both low-calorie diet, increased physical activity and behavioral counseling  for a minimum of 6 months without a 3% loss from baseline; 4) Patients BMI is one of the following: a) 30kg/m or greater; b) 27 29.99 kg/m in the presence of at least one weight related comorbidity such as coronary heart disease, dyslipidemia, hypertension, symptomatic arthritis of the lower extremities, type 2 diabetes mellitus, obstructive sleep apnea; c) 25-29.9 kg/m2 and waist circumference is &gt; 40 inches for males or &gt; 35 inches for females;5) Not Met - Must have tried and failed at least one preferred oral agent such as Phentermine; 6) Patient has no labeled contraindication; 7) Patient is not taking another weight loss medication; 8) The dose is within approved product labeling guidelines; 10) The patient must not be taking another GLP-1 receptor agonist AND the patient must not be taking insulin concurrently.  -prescribed Wegovy  but discussed that this was denied -referred to nutrition -recommended resistance training and prioritizing protein intake to avoid muscle loss while taking this medication -discussed risks of thyroid  cancer and gastroparesis  -Educated regarding health benefits of weight loss- for pain, general health, chronic disease prevention, immune health, mental health.  -Will monitor weight every visit.  -Consider Roobois tea daily.  -Discussed the benefits of intermittent fasting. -Discussed foods that can assist in weight loss: 1) leafy greens- high in fiber and nutrients 2) dark chocolate- improves metabolism (if prefer sweetened, best to sweeten with honey instead of sugar).  3) cruciferous vegetables- high in fiber and protein 4) full fat yogurt: high in healthy fat, protein, calcium , and probiotics 5) apples- high in a variety of phytochemicals 6) nuts- high in fiber and protein that increase feelings of fullness 7) grapefruit: rich in nutrients, antioxidants, and fiber (not to be taken with anticoagulation) 8) beans- high in protein and fiber 9) salmon- has high  quality protein and healthy fats 10) green tea- rich in polyphenols 11) eggs- rich in choline and vitamin D  12) tuna- high protein, boosts metabolism 13) avocado- decreases visceral abdominal fat 14) chicken (pasture raised): high in protein and iron 15) blueberries- reduce abdominal fat and cholesterol 16) whole grains- decreases calories retained during digestion, speeds metabolism 17) chia seeds- curb appetite 18) chilies- increases fat metabolism  -Discussed supplements that can be used:  1) Metatrim 400mg  BID 30 minutes before breakfast and dinner  2) Sphaeranthus indicus and Garcinia mangostana (combinations of these and #1 can be found in capsicum and zychrome  3) green coffee bean extract 400mg  twice per day or Irvingia (african mango) 150 to 300mg  twice per day.  6) Iron deficiency anemia: -discussed that when she supplemented she felt improved energy.   7) Radicular lumbar pain: -lumbar XR ordered -increase topamax  HS to 50mg   8) Headaches: -discussed that topamax  is usually beneficial for headaches -discussed home sleep apnea testing  9) depression: -discussed that it embarrassed her to have to give up her job -discussed that this continues -discussed that she has to make time for herself -discussed that her mom needs time from her and she may have mild cognitive impairment -discussed that on Sunday she didn't even come out  of her room -discussed ketogenic diet  10) Knee pain: -discussed increased pain after viscosupplementation -discussed ketogenic diet -tried to get Wegovy  approved but it was denied -discussed that pain has been severe -discussed that pain is improved with increased topamax  -discussed that she had an issue with nutritional referral, asked staff to check with patient if she would like to be referred to medical weight management  11) Stress due to illness of family members: -discussed how these shook her  40) Caregiver burnout: -discussed that  she still takes care of her mom full time  32) History of pneumonia: -continue vitamin D  supplement, Zinc, vitamin C  14) Joint pain in both feet: -discussed that started after she had a lot of activity on Sunday but there are flareups are getting more aggressive than before.  -food allergy testing ordered -recommended natto or nattokinase  -Discussed Qutenza as an option for neuropathic pain control. Discussed that this is a capsaicin patch, stronger than capsaicin cream. Discussed that it is currently approved for diabetic peripheral neuropathy and post-herpetic neuralgia, but that it has also shown benefit in treating other forms of neuropathy. Provided patient with link to site to learn more about the patch: https://www.clark.biz/. Discussed that the patch would be placed in office and benefits usually last 3 months. Discussed that unintended exposure to capsaicin can cause severe irritation of eyes, mucous membranes, respiratory tract, and skin, but that Qutenza is a local treatment and does not have the systemic side effects of other nerve medications. Discussed that there may be pain, itching, erythema, and decreased sensory function associated with the application of Qutenza. Side effects usually subside within 1 week. A cold pack of analgesic medications can help with these side effects. Blood pressure can also be increased due to pain associated with administration of the patch.   15) right arm pain:  Discussed symptoms could be 2/2 fibromyalgia  16) Lower back pain/left hip pain: -MIR reviewed and shows disc degeneration -Discussed Qutenza as an option for neuropathic pain control. Discussed that this is a capsaicin patch, stronger than capsaicin cream. Discussed that it is currently approved for diabetic peripheral neuropathy and post-herpetic neuralgia, but that it has also shown benefit in treating other forms of neuropathy. Provided patient with link to site to learn more about the  patch: https://www.clark.biz/. Discussed that the patch would be placed in office and benefits usually last 3 months. Discussed that unintended exposure to capsaicin can cause severe irritation of eyes, mucous membranes, respiratory tract, and skin, but that Qutenza is a local treatment and does not have the systemic side effects of other nerve medications. Discussed that there may be pain, itching, erythema, and decreased sensory function associated with the application of Qutenza. Side effects usually subside within 1 week. A cold pack of analgesic medications can help with these side effects. Blood pressure can also be increased due to pain associated with administration of the patch.   17) Cervical spine pain: -XR ordered  18) Pelvic pain: -discussed following up wit OB/GYN  19) Eczema:  -examined legs -discussed that she is following with dermatologist on Thursday

## 2023-10-16 ENCOUNTER — Ambulatory Visit: Admitting: Physician Assistant

## 2023-10-17 ENCOUNTER — Other Ambulatory Visit (INDEPENDENT_AMBULATORY_CARE_PROVIDER_SITE_OTHER): Payer: Self-pay

## 2023-10-17 ENCOUNTER — Ambulatory Visit: Admitting: Physician Assistant

## 2023-10-17 ENCOUNTER — Encounter: Payer: Self-pay | Admitting: Physician Assistant

## 2023-10-17 ENCOUNTER — Other Ambulatory Visit: Payer: Self-pay

## 2023-10-17 DIAGNOSIS — M25561 Pain in right knee: Secondary | ICD-10-CM | POA: Diagnosis not present

## 2023-10-17 DIAGNOSIS — G8929 Other chronic pain: Secondary | ICD-10-CM

## 2023-10-17 DIAGNOSIS — M25562 Pain in left knee: Secondary | ICD-10-CM

## 2023-10-17 DIAGNOSIS — L400 Psoriasis vulgaris: Secondary | ICD-10-CM | POA: Diagnosis not present

## 2023-10-17 MED ORDER — METHYLPREDNISOLONE ACETATE 40 MG/ML IJ SUSP
40.0000 mg | INTRAMUSCULAR | Status: AC | PRN
  Administered 2023-10-17: 40 mg via INTRA_ARTICULAR

## 2023-10-17 MED ORDER — LIDOCAINE HCL 1 % IJ SOLN
2.0000 mL | INTRAMUSCULAR | Status: AC | PRN
  Administered 2023-10-17: 2 mL

## 2023-10-17 MED ORDER — BUPIVACAINE HCL 0.25 % IJ SOLN
2.0000 mL | INTRAMUSCULAR | Status: AC | PRN
Start: 2023-10-17 — End: 2023-10-17
  Administered 2023-10-17: 2 mL via INTRA_ARTICULAR

## 2023-10-17 MED ORDER — BUPIVACAINE HCL 0.25 % IJ SOLN
2.0000 mL | INTRAMUSCULAR | Status: AC | PRN
  Administered 2023-10-17: 2 mL via INTRA_ARTICULAR

## 2023-10-17 NOTE — Progress Notes (Signed)
 Office Visit Note   Patient: Ashley Lindsey           Date of Birth: 12/11/1960           MRN: [id number] Visit Date: 10/17/2023              Requested by: Georgina Speaks, FNP [address] [place],  [place] [address] PCP: Georgina Speaks, FNP  Chief Complaint  Patient presents with  . Left Knee - Pain  . Right Knee - Pain      HPI: Patient is a pleasant 63 year old woman who I have seen in the past she has history of bilateral knee arthritis.  She periodically gets steroid injections.  She did tried gel injections but had significant pain with them so prefers the steroid injections she has been losing some weight.  Assessment & Plan: Visit Diagnoses:  1. Chronic pain of both knees     Plan: Went forward with injections without difficulty may follow-up with me as needed.  I also placed an physical therapy referral for her multi joint pain as well as her knee arthritis.  Follow-Up Instructions: Return if symptoms worsen or fail to improve.   Ortho Exam  Patient is alert, oriented, no adenopathy, well-dressed, normal affect, normal respiratory effort. Bilateral knees no erythema compartments are soft and compressible she is neurovascularly intact she has obvious varus deformities no evidence of cellulitis or infection    Imaging: XR KNEE 3 VIEW RIGHT Result Date: 10/17/2023 Radiographs of the right knee end-stage tricompartmental arthritis with bone-on-bone changes especially in the medial compartment and patellofemoral compartment  No images are attached to the encounter.  Labs: Lab Results  Component Value Date   HGBA1C 5.9 (H) 07/11/2023   HGBA1C 6.3 (H) 02/22/2023   HGBA1C 6.3 (H) 11/20/2022   ESRSEDRATE 48 (H) 08/26/2019   LABURIC 4.8 08/26/2019   REPTSTATUS 06/12/2017 FINAL 06/10/2017   CULT >=100,000 COLONIES/mL ESCHERICHIA COLI (A) 06/10/2017   LABORGA ESCHERICHIA COLI (A) 06/10/2017     Lab Results  Component Value Date   ALBUMIN 4.1  02/22/2023   ALBUMIN 4.3 03/29/2022   ALBUMIN 4.2 03/05/2022    Lab Results  Component Value Date   MG 1.9 01/16/2022   MG 2.0 02/17/2020   Lab Results  Component Value Date   VD25OH 51.5 11/20/2022   VD25OH 41.6 01/16/2022   VD25OH 32 12/26/2011    No results found for: PREALBUMIN    Latest Ref Rng & Units 02/22/2023   11:17 AM 07/05/2022   12:15 PM 04/12/2022    4:13 PM  CBC EXTENDED  WBC 3.4 - 10.8 x10E3/uL 9.2  8.9  9.6   RBC 3.77 - 5.28 x10E6/uL 4.63  4.81  4.83   Hemoglobin 11.1 - 15.9 g/dL 88.1  87.3  88.9   HCT 34.0 - 46.6 % 39.5  39.3  36.7   Platelets 150 - 450 x10E3/uL 372  351.0  293.0   NEUT# 1.4 - 7.7 K/uL  5.4  6.2   Lymph# 0.7 - 4.0 K/uL  2.7  2.6      There is no height or weight on file to calculate BMI.  Orders:  Orders Placed This Encounter  Procedures  . XR KNEE 3 VIEW RIGHT  . XR KNEE 3 VIEW LEFT   No orders of the defined types were placed in this encounter.    Procedures: Large Joint Inj: bilateral knee on 10/17/2023 10:08 AM Indications: pain and diagnostic evaluation Details: 22 G  1.5 in needle, anteromedial approach  Arthrogram: No  Medications (Right): 2 mL lidocaine  1 %; 2 mL bupivacaine  0.25 %; 40 mg methylPREDNISolone  acetate 40 MG/ML Medications (Left): 2 mL lidocaine  1 %; 2 mL bupivacaine  0.25 %; 40 mg methylPREDNISolone  acetate 40 MG/ML Outcome: tolerated well, no immediate complications Procedure, treatment alternatives, risks and benefits explained, specific risks discussed. Consent was given by the patient. Immediately prior to procedure a time out was called to verify the correct patient, procedure, equipment, support staff and site/side marked as required. Patient was prepped and draped in the usual sterile fashion.    Clinical Data: No additional findings.  ROS:  All other systems negative, except as noted in the HPI. Review of Systems  Objective: Vital Signs: There were no vitals taken for this  visit.  Specialty Comments:  No specialty comments available.  PMFS History: Patient Active Problem List   Diagnosis Date Noted  . Rash 07/22/2023  . Pelvic pain 07/22/2023  . Mixed hyperlipidemia 03/20/2023  . Urinary urgency 03/14/2023  . Encounter for annual health examination 03/14/2023  . COVID-19 vaccination declined 03/14/2023  . Class 3 severe obesity due to excess calories with serious comorbidity and body mass index (BMI) of 50.0 to 59.9 in adult 03/14/2023  . Need for influenza vaccination 03/14/2023  . Acute cough 02/20/2023  . Right sided abdominal pain 02/19/2023  . Hematuria 02/19/2023  . Abnormal urine odor 12/31/2022  . Cervicogenic headache 12/31/2022  . Urinary frequency 11/28/2022  . Urinary tract infection without hematuria 11/28/2022  . Elevated cholesterol 11/20/2022  . Vitamin D  deficiency 11/20/2022  . Tibialis posterior tendinitis 10/08/2022  . OSA (obstructive sleep apnea) 09/04/2022  . Claustrophobia 09/04/2022  . Suboptimal nutrition (HCC) 07/05/2022  . Cardiomyopathy, unspecified type (HCC) 07/05/2022  . Essential hypertension 07/05/2022  . Type 2 diabetes mellitus with diabetic dermatitis, without long-term current use of insulin (HCC) 07/05/2022  . Chronic gastric ulcer without hemorrhage and without perforation 04/30/2022  . Gastric AVM 04/30/2022  . Iron deficiency anemia 03/06/2022  . Unilateral primary osteoarthritis, left knee 10/02/2021  . Pulmonary nodules 07/12/2021  . Chronic cough 07/12/2021  . Avulsion fracture of middle phalanx of finger 04/18/2021  . Tibial plateau fracture, left 04/18/2021  . Asymmetrical sensorineural hearing loss 02/02/2021  . Bilateral primary osteoarthritis of knee 02/09/2020  . Chronic intermittent hypoxia with obstructive sleep apnea 08/24/2019  . Chronic intractable headache 07/14/2019  . Morning headache 07/14/2019  . Loud snoring 07/14/2019  . Super obesity 07/14/2019  . Sleeps in sitting position  due to orthopnea 07/14/2019  . Chest pain due to GERD 07/14/2019  . Nocturia more than twice per night 07/14/2019  . Unilateral primary osteoarthritis, right knee 05/26/2019  . Chronic migraine without aura without status migrainosus, not intractable 05/13/2019  . Class 3 severe obesity due to excess calories without serious comorbidity with body mass index (BMI) of 40.0 to 44.9 in adult 08/05/2018  . Depression 08/05/2018  . Chronic nonintractable headache 08/05/2018  . Chronic pain of right ankle 04/24/2018  . Breast cancer of lower-outer quadrant of right female breast (HCC) 12/02/2014  . Bronchospasm 09/21/2014  . Chronic pain of right knee 09/21/2014  . Fibromyalgia 03/06/2013  . Dense breasts 03/06/2013  . Mastalgia 03/06/2013  . Back pain, lumbosacral 03/06/2013  . Lymphedema of arm 03/06/2013  . Atypical chest pain 01/06/2013  . Breast cancer of upper-inner quadrant of left female breast (HCC) 09/08/2012  . Family history of malignant neoplasm of breast 09/08/2012  .  S/P radiation therapy   . History of breast cancer in female 08/13/2011   Past Medical History:  Diagnosis Date  . Abdominal cramps   . Anxiety   . Arthritis   . breast cancer    s/p radiation- last dose 6/12//has been off Tamoxifen  8/12  . Chronic headaches   . Costochondritis   . Dyspnea 09/21/2014  . Fatigue   . Fibromyalgia 03/06/2013  . GERD (gastroesophageal reflux disease)   . History of COVID-19   . Hypertension   . Lymphedema    RIGHT ARM-////  STATES USE LEFT ARM FOR BP'S  . Migraines   . Muscle spasms of head and/or neck    following to the breast  . Passed out    4th of july  . Personal history of radiation therapy 2010   lt breast   . S/P radiation therapy 08/19/07 - 10/10/07   Left Breast/5040 cGy/28 fractions with Boost for a toatl dose of 6300 dGy  . S/P radiation therapy 08/14/10 -10/03/10   right breast  . Sleep apnea    STOP BANG SCORE 5    Family History  Problem Relation Age  of Onset  . Breast cancer Mother 31  . Heart disease Father   . Diabetes Sister   . Prostate cancer Maternal Grandfather 21  . Cancer Maternal Aunt 63       d. from female cancer possibly cervical  . Breast cancer Maternal Aunt 60  . Prostate cancer Maternal Uncle 78  . Prostate cancer Maternal Uncle 80  . Prostate cancer Paternal Uncle   . Prostate cancer Paternal Uncle   . Breast cancer Cousin 30       mat 1st cousin    Past Surgical History:  Procedure Laterality Date  . ABDOMINAL HYSTERECTOMY     with left salpingooophorectomy  . BIOPSY  04/30/2022   Procedure: BIOPSY;  Surgeon: Legrand Victory LITTIE DOUGLAS, MD;  Location: [facility];  Service: Gastroenterology;;  . BREAST LUMPECTOMY  2009/2012   LEFT/RIGHT with lymph node dissection  . COLONOSCOPY WITH PROPOFOL  N/A 04/30/2022   Procedure: COLONOSCOPY WITH PROPOFOL ;  Surgeon: Legrand Victory LITTIE DOUGLAS, MD;  Location: [facility];  Service: Gastroenterology;  Laterality: N/A;  . ESOPHAGOGASTRODUODENOSCOPY (EGD) WITH PROPOFOL  N/A 04/30/2022   Procedure: ESOPHAGOGASTRODUODENOSCOPY (EGD) WITH PROPOFOL ;  Surgeon: Legrand Victory LITTIE DOUGLAS, MD;  Location: [facility];  Service: Gastroenterology;  Laterality: N/A;  . HOT HEMOSTASIS N/A 04/30/2022   Procedure: HOT HEMOSTASIS (ARGON PLASMA COAGULATION/BICAP);  Surgeon: Legrand Victory LITTIE DOUGLAS, MD;  Location: [facility];  Service: Gastroenterology;  Laterality: N/A;  . KNEE ARTHROSCOPY     right  . OOPHORECTOMY  2013   Social History   Occupational History  . Occupation: disability  Tobacco Use  . Smoking status: Former    Current packs/day: 0.25    Average packs/day: 0.3 packs/day for 1 year (0.3 ttl pk-yrs)    Types: Cigarettes  . Smokeless tobacco: Never  Vaping Use  . Vaping status: Never Used  Substance and Sexual Activity  . Alcohol use: Not Currently  . Drug use: No  . Sexual activity: Not Currently    Birth control/protection: Surgical

## 2023-10-24 ENCOUNTER — Other Ambulatory Visit: Payer: Self-pay | Admitting: *Deleted

## 2023-10-24 NOTE — Progress Notes (Signed)
 Received call from pt requesting script for a new sleeve, pt states she feels that her current right sided compression sleeve is too tight.  RN successfully faxed order [facility].  Pt also has complaint of intermittent right sided breast pain.  Pt denies recent injury or trauma and would like a breast exam.  [facility] visit scheduled, pt verbalized understanding.

## 2023-11-04 NOTE — Progress Notes (Unsigned)
 Ashley Lindsey

## 2023-11-05 ENCOUNTER — Encounter: Payer: Self-pay | Admitting: Neurology

## 2023-11-05 ENCOUNTER — Ambulatory Visit (INDEPENDENT_AMBULATORY_CARE_PROVIDER_SITE_OTHER): Admitting: Neurology

## 2023-11-05 VITALS — BP 130/82 | HR 76 | Ht 62.0 in | Wt 273.2 lb

## 2023-11-05 DIAGNOSIS — Z91199 Patient's noncompliance with other medical treatment and regimen due to unspecified reason: Secondary | ICD-10-CM | POA: Diagnosis not present

## 2023-11-05 DIAGNOSIS — G4733 Obstructive sleep apnea (adult) (pediatric): Secondary | ICD-10-CM | POA: Diagnosis not present

## 2023-11-05 NOTE — Progress Notes (Signed)
 Provider:  Dedra Gores, MD  Primary Care Physician:  Georgina Speaks, FNP [address] [place] [address]     Referring Provider:  Dr Ines, MD         Chief Complaint according to patient   Patient presents with:                HISTORY OF PRESENT ILLNESS:  Ashley Lindsey is a 63 y.o. female patient who is here for revisit 11/05/2023 for CPAP compliance.   Ashley Lindsey has been followed in our sleep clinic since 2021 when she was diagnosed in February with chronic intermittent hypoxia associated with obstructive sleep apnea.  She reportedly slept better in reclined or elevated position, and her CPAP titration was successful so CPAP had not been the prescribed method of treatment.  She had a follow-up visit in May 24 and then had another home sleep test in the same year.  Basically her headaches had worsened and Dr. Darleen who is her primary neurologist had referred her back.  She was also concerned because we had wanted her to use a BiPAP machine but the prescribed machine had not been picked up according to the medical equipment company and the patient had not corresponded with the company.  She has a as of August 2024 last used CPAP compliantly.  We have no data for the last 8 months showing good compliance.  So the overall compliance right now is under 20%.  The patient reports that she sometimes forgets to put the CPAP on and sometimes she will use it but not for the full night.  When I saw her last in October 10, 2022 she was awaiting total knee replacement but was at a higher risk for surgery based on anesthesiology risk being elevated in patients with obstructive sleep apnea and because of her obesity.  She had gained weight in the interval - this excludes her from inspire.   Of the 3 years before between both visits and today's body mass index is 50.  With a weight of 275 pounds in clothing.  She does acknowledge that she sleeps better when she uses CPAP so  I would really encourage her to use it daily for a minimum of 4 hours.  If she takes a daytime nap she should use her CPAP, if she is sleeping in another place in her home aside from the bedroom she has to place the CPAP at the place where she sleeps.   It will be difficult to get supplies for her CPAP based on the poor compliance data so I urged her to use her CPAP for 30 days, I do not have the machine available here only the downloads from the computer.  My goal would be for her to use the machine for 30 days 4 hours minimum daily call the DME or us  to get a download.  The data that I have reviewed today were from 8-1 2024 there is nothing more recent.  Also I can help her to get a replacement mask but all other supplies have to come from the medical equipment company.  Current Epworth Sleepiness Scale endorsed at 11 points and fatigue severity scale at 44 points with the geriatric depression score at 6 points.  The patient does have osteoarthritis of her knees.  She does have chronic pain.    Review of Systems: Out of a complete 14 system review, the patient complains of  only the following symptoms, and all other reviewed systems are negative.:   How likely are you to doze in the following situations: 0 = not likely, 1 = slight chance, 2 = moderate chance, 3 = high chance  Sitting and Reading? Watching Television? Sitting inactive in a public place (theater or meeting)? Lying down in the afternoon when circumstances permit? Sitting and talking to someone? Sitting quietly after lunch without alcohol? In a car, while stopped for a few minutes in traffic? As a passenger in a car for an hour without a break?  Current Epworth Sleepiness Scale endorsed at 11 points and fatigue severity scale at 44 points with the geriatric depression score at 6 points.  The patient does have osteoarthritis of her knees.  She does have chronic pain.    Ashley Lindsey is a 64 y.o. female patient who is here for  revisit 09/04/2022 for  sleep apnea, wants a new evaluation: The patient was referred by Dr Ines, MD .  09-04-2022: Chief concern according to patient :   my headaches have worsened, and I need to consider CPAP now -BTW:  I never got my BiPAP - see note from NP [place] below. The patient didn't pick up her prescribed machine and never corresponded with DME about her mask preferences, etc.    She has taken Excedrin in excess over years, and was taken off by GI after a Colonoscopy confirmed in march 2024 as she has ulcers, lost blood, became anemic.     Social History   Socioeconomic History   Marital status: Divorced    Spouse name: Not on file   Number of children: 1   Years of education: Not on file   Highest education level: Not on file  Occupational History   Occupation: disability  Tobacco Use   Smoking status: Former    Current packs/day: 0.25    Average packs/day: 0.3 packs/day for 1 year (0.3 ttl pk-yrs)    Types: Cigarettes   Smokeless tobacco: Never  Vaping Use   Vaping status: Never Used  Substance and Sexual Activity   Alcohol use: Not Currently   Drug use: No   Sexual activity: Not Currently    Birth control/protection: Surgical  Other Topics Concern   Not on file  Social History Narrative   Right handed    Coffee daily, sometimes Ginger ale    Social Drivers of Health   Financial Resource Strain: Low Risk  (02/13/2023)   Overall Financial Resource Strain (CARDIA)    Difficulty of Paying Living Expenses: Not hard at all  Food Insecurity: No Food Insecurity (02/13/2023)   Hunger Vital Sign    Worried About Running Out of Food in the Last Year: Never true    Ran Out of Food in the Last Year: Never true  Transportation Needs: No Transportation Needs (02/13/2023)   PRAPARE - Administrator, Civil Service (Medical): No    Lack of Transportation (Non-Medical): No  Physical Activity: Inactive (02/13/2023)   Exercise Vital Sign    Days of Exercise  per Week: 0 days    Minutes of Exercise per Session: 0 min  Stress: No Stress Concern Present (02/13/2023)   [name] of Occupational Health - Occupational Stress Questionnaire    Feeling of Stress : Not at all  Social Connections: Moderately Integrated (02/13/2023)   Social Connection and Isolation Panel    Frequency of Communication with Friends and Family: More than three times a week  Frequency of Social Gatherings with Friends and Family: More than three times a week    Attends Religious Services: More than 4 times per year    Active Member of [employer] or Organizations: Yes    Attends Engineer, structural: More than 4 times per year    Marital Status: Divorced    Family History  Problem Relation Age of Onset   Breast cancer Mother 18   Heart disease Father    Diabetes Sister    Prostate cancer Maternal Grandfather 28   Cancer Maternal Aunt 63       d. from female cancer possibly cervical   Breast cancer Maternal Aunt 60   Prostate cancer Maternal Uncle 78   Prostate cancer Maternal Uncle 80   Prostate cancer Paternal Uncle    Prostate cancer Paternal Uncle    Breast cancer Cousin 30       mat 1st cousin    Past Medical History:  Diagnosis Date   Abdominal cramps    Anxiety    Arthritis    breast cancer    s/p radiation- last dose 6/12//has been off Tamoxifen  8/12   Chronic headaches    Costochondritis    Dyspnea 09/21/2014   Fatigue    Fibromyalgia 03/06/2013   GERD (gastroesophageal reflux disease)    History of COVID-19    Hypertension    Lymphedema    RIGHT ARM-////  STATES USE LEFT ARM FOR BP'S   Migraines    Muscle spasms of head and/or neck    following to the breast   Passed out    4th of july   Personal history of radiation therapy 2010   lt breast    S/P radiation therapy 08/19/07 - 10/10/07   Left Breast/5040 cGy/28 fractions with Boost for a toatl dose of 6300 dGy   S/P radiation therapy 08/14/10 -10/03/10   right breast    Sleep apnea    STOP BANG SCORE 5    Past Surgical History:  Procedure Laterality Date   ABDOMINAL HYSTERECTOMY     with left salpingooophorectomy   BIOPSY  04/30/2022   Procedure: BIOPSY;  Surgeon: Legrand Victory LITTIE DOUGLAS, MD;  Location: [facility];  Service: Gastroenterology;;   BREAST LUMPECTOMY  2009/2012   LEFT/RIGHT with lymph node dissection   COLONOSCOPY WITH PROPOFOL  N/A 04/30/2022   Procedure: COLONOSCOPY WITH PROPOFOL ;  Surgeon: Legrand Victory LITTIE DOUGLAS, MD;  Location: [facility];  Service: Gastroenterology;  Laterality: N/A;   ESOPHAGOGASTRODUODENOSCOPY (EGD) WITH PROPOFOL  N/A 04/30/2022   Procedure: ESOPHAGOGASTRODUODENOSCOPY (EGD) WITH PROPOFOL ;  Surgeon: Legrand Victory LITTIE DOUGLAS, MD;  Location: [facility];  Service: Gastroenterology;  Laterality: N/A;   HOT HEMOSTASIS N/A 04/30/2022   Procedure: HOT HEMOSTASIS (ARGON PLASMA COAGULATION/BICAP);  Surgeon: Legrand Victory LITTIE DOUGLAS, MD;  Location: [facility];  Service: Gastroenterology;  Laterality: N/A;   KNEE ARTHROSCOPY     right   OOPHORECTOMY  2013     Current Outpatient Medications on File Prior to Visit  Medication Sig Dispense Refill   acetaminophen  (TYLENOL ) 650 MG CR tablet Take 650 mg by mouth every 8 (eight) hours as needed for pain.     albuterol  (VENTOLIN  HFA) 108 (90 Base) MCG/ACT inhaler Inhale 2 puffs into the lungs every 4 (four) hours as needed for wheezing or shortness of breath. 1 each 0   Bisacodyl (DULCOLAX PO) Take 1 capsule by mouth daily as needed (constipation).      cholecalciferol  5000 units TABS Take 1 tablet (5,000  Units total) by mouth 2 (two) times daily. (Patient taking differently: Take 4,000 Units by mouth daily.)     cyclobenzaprine  (FLEXERIL ) 10 MG tablet TAKE 1 TABLET BY MOUTH EVERY DAY AS NEEDED 30 tablet 1   dexlansoprazole  (DEXILANT ) 60 MG capsule TAKE 1 CAPSULE BY MOUTH EVERY DAY 90 capsule 1   diclofenac  Sodium (VOLTAREN  ARTHRITIS PAIN) 1 % GEL Apply 2 g topically 4 (four) times daily. 100 g 1    diphenhydrAMINE  (BENADRYL ) 25 MG tablet Take 1 tablet (25 mg total) by mouth every 6 (six) hours as needed for up to 20 doses (migraine). Take with reglan  for migraine headache 20 tablet 0   ELDERBERRY PO Take 1 tablet by mouth daily.     ferrous sulfate 324 (65 Fe) MG TBEC Take 325 mg by mouth 2 (two) times daily.     fish oil-omega-3 fatty acids  1000 MG capsule Take 1 g by mouth daily.     Flaxseed, Linseed, (FLAXSEED OIL MAX STR PO) Take 1 capsule by mouth daily.      furosemide  (LASIX ) 20 MG tablet TAKE 1 TABLET BY MOUTH EVERY DAY AS NEEDED 90 tablet 1   gabapentin  (NEURONTIN ) 600 MG tablet Take 1 tablet (600 mg total) by mouth in the morning, at noon, in the evening, and at bedtime. 120 tablet 3   meloxicam  (MOBIC ) 7.5 MG tablet TAKE 1 TABLET BY MOUTH EVERY DAY 90 tablet 1   Multiple Vitamins-Minerals (MULTIVITAMIN & MINERAL PO) Take 1 tablet by mouth daily.     mupirocin  ointment (BACTROBAN ) 2 % Apply 1 Application topically 2 (two) times daily. (Patient taking differently: Apply 1 Application topically as needed.) 22 g 0   ondansetron  (ZOFRAN ) 4 MG tablet TAKE 1 TABLET BY MOUTH DAILY AS NEEDED FOR NAUSEA OR VOMITING 30 tablet 1   OVER THE COUNTER MEDICATION Take 125 mg by mouth daily. [place]/Simethicone     polyethylene glycol (MIRALAX / GLYCOLAX) 17 g packet Take 17 g by mouth daily as needed.     propranolol  (INDERAL ) 10 MG tablet Take 1 tablet (10 mg total) by mouth 2 (two) times daily. *HOLD IF SYSTOLIC BP (TOP #) IS <100MMHG OR PULSE IS <60BPM 180 tablet 3   sacubitril-valsartan (ENTRESTO ) 24-26 MG Take 1 tablet by mouth 2 (two) times daily. 180 tablet 3   Semaglutide , 1 MG/DOSE, 4 MG/3ML SOPN Inject 1 mg into the skin once a week. 9 mL 1   spironolactone  (ALDACTONE ) 25 MG tablet TAKE 1 TABLET BY MOUTH EVERY DAY IN THE MORNING 90 tablet 2   SYMBICORT  80-4.5 MCG/ACT inhaler INHALE 2 PUFFS INTO THE LUNGS IN THE MORNING AND AT BEDTIME. (Patient taking differently: Inhale 2 puffs into the  lungs 2 (two) times daily as needed.) 30.6 each 1   triamcinolone  cream (KENALOG ) 0.1 % APPLY TO AFFECTED AREA (RASH) TWICE A DAY AS NEEDED 60 g 1   Turmeric 500 MG CAPS Take by mouth.     venlafaxine  XR (EFFEXOR -XR) 75 MG 24 hr capsule TAKE 1 CAPSULE (75 MG TOTAL) BY MOUTH IN THE MORNING AND AT BEDTIME. 180 capsule 1   vitamin E 200 UNIT capsule Take 200 Units by mouth daily.     atorvastatin  (LIPITOR) 10 MG tablet TAKE 1 TABLET BY MOUTH EVERY DAY AT NIGHT (Patient not taking: Reported on 11/05/2023) 90 tablet 1   gabapentin  (NEURONTIN ) 300 MG capsule TAKE 1 CAPSULE BY MOUTH THREE TIMES A DAY (Patient not taking: Reported on 11/05/2023) 90 capsule 3   raNITIdine  HCl (RANITIDINE 75 PO) Take 75 mg by mouth daily as needed.     sulfamethoxazole -trimethoprim  (BACTRIM  DS) 800-160 MG tablet Take 1 tablet by mouth 2 (two) times daily. (Patient not taking: Reported on 11/05/2023) 10 tablet 0   Suzetrigine  (JOURNAVX ) 50 MG TABS Take 1 tablet by mouth 2 (two) times daily as needed. (Patient not taking: Reported on 11/05/2023) 60 tablet 0   topiramate  (TOPAMAX ) 25 MG tablet Take 1 tablet (25 mg total) by mouth daily as needed (breakthrough pain). (Patient not taking: Reported on 11/05/2023) 90 tablet 3   traMADol  (ULTRAM ) 50 MG tablet Take 1 tablet (50 mg total) by mouth every 6 (six) hours as needed. (Patient not taking: Reported on 11/05/2023) 30 tablet 0   No current facility-administered medications on file prior to visit.    Allergies  Allergen Reactions   Aspirin     unknown   Penicillins Hives and Swelling    Has patient had a PCN reaction causing immediate rash, facial/tongue/throat swelling, SOB or lightheadedness with hypotension: Yes Has patient had a PCN reaction causing severe rash involving mucus membranes or skin necrosis: Yes Has patient had a PCN reaction that required hospitalization Yes Has patient had a PCN reaction occurring within the last 10 years: No If all of the above answers are  NO, then may proceed with Cephalosporin use.      DIAGNOSTIC DATA (LABS, IMAGING, TESTING) - I reviewed patient records, labs, notes, testing and imaging myself where available.  Lab Results  Component Value Date   WBC 9.2 02/22/2023   HGB 11.8 02/22/2023   HCT 39.5 02/22/2023   MCV 85 02/22/2023   PLT 372 02/22/2023      Component Value Date/Time   NA 142 07/11/2023 1226   NA 139 01/07/2017 0920   K 4.6 07/11/2023 1226   K 4.1 01/07/2017 0920   CL 101 07/11/2023 1226   CL 105 12/26/2011 1538   CO2 27 07/11/2023 1226   CO2 25 01/07/2017 0920   GLUCOSE 76 07/11/2023 1226   GLUCOSE 107 (H) 04/06/2021 0248   GLUCOSE 111 01/07/2017 0920   GLUCOSE 103 (H) 12/26/2011 1538   BUN 10 07/11/2023 1226   BUN 8.7 01/07/2017 0920   CREATININE 0.55 (L) 07/11/2023 1226   CREATININE 0.72 10/05/2020 1141   CREATININE 0.8 01/07/2017 0920   CALCIUM  9.1 07/11/2023 1226   CALCIUM  8.9 01/07/2017 0920   PROT 6.8 02/22/2023 1117   PROT 7.0 01/07/2017 0920   ALBUMIN 4.1 02/22/2023 1117   ALBUMIN 3.5 01/07/2017 0920   AST 15 02/22/2023 1117   AST 14 (L) 10/05/2020 1141   AST 47 (H) 01/07/2017 0920   ALT 11 02/22/2023 1117   ALT 16 10/05/2020 1141   ALT 54 01/07/2017 0920   ALKPHOS 76 02/22/2023 1117   ALKPHOS 73 01/07/2017 0920   BILITOT <0.2 02/22/2023 1117   BILITOT <0.2 (L) 10/05/2020 1141   BILITOT 0.22 01/07/2017 0920   GFRNONAA >60 04/06/2021 0248   GFRNONAA >60 10/05/2020 1141   GFRAA 110 02/03/2020 1243   GFRAA >60 03/03/2018 1445   Lab Results  Component Value Date   CHOL 193 07/11/2023   HDL 73 07/11/2023   LDLCALC 101 (H) 07/11/2023   TRIG 106 07/11/2023   CHOLHDL 2.6 07/11/2023   Lab Results  Component Value Date   HGBA1C 5.9 (H) 07/11/2023   No results found for: VITAMINB12 No results found for: TSH  PHYSICAL EXAM:  Vitals:   11/05/23 1107  BP:  130/82  Pulse: 76   No data found. Body mass index is 49.97 kg/m.   Wt Readings from Last 3  Encounters:  11/05/23 273 lb 3.2 oz (123.9 kg)  10/15/23 271 lb 12.8 oz (123.3 kg)  07/11/23 282 lb 6.4 oz (128.1 kg)     Ht Readings from Last 3 Encounters:  11/05/23 5' 2 (1.575 m)  10/15/23 5' 2 (1.575 m)  07/11/23 5' 2 (1.575 m)      General: The patient is awake, alert and appears not in acute distress. The patient is groomed. Head: Normocephalic, atraumatic. Neck is supple. Neck is supple. Mallampati 4,  neck circumference:17 inches . Nasal airflow  patent.  Retrognathia is seen.  Dental status: not appreciated.  Cardiovascular:  Regular rate and cardiac rhythm by pulse,  without distended neck veins. Respiratory: Lungs are clear to auscultation.  Skin:  evidence of ankle edema, lymphedema on the right arm, knee edema.  Trunk: The patient's posture is stooped, she is in pain.     NEUROLOGIC EXAM: The patient is awake and alert, oriented to place and time.   Memory subjective described as intact.  Attention span & concentration ability appears normal.  Speech is fluent,  without  dysarthria, dysphonia or aphasia.  Mood and affect are appropriate.   Cranial nerves: no loss of smell or taste reported  Pupils are equal and briskly reactive to light.  Extraocular movements in vertical and horizontal planes were intact and without nystagmus. No Diplopia. Visual fields by finger perimetry are intact. Hearing was intact to soft voice and finger rubbing.    Facial sensation intact to fine touch.  Facial motor strength is symmetric and tongue and uvula move midline.  Neck ROM : rotation, tilt and flexion extension were normal for age and shoulder shrug was symmetrical.     Pain in fingers, arthritis, hip , knee, foot pain.  ASSESSMENT AND PLAN :   63 y.o. year old AA female patient of Dr Ines, Dr Michele and [name], NP     here with:  high fatigue, not sleeping well, but non -compliant OSA on CPAP ,  Orthopnea with obesity and OSA. BMI 50.   Migraine and cluster  headaches. GI bleed after overusing Excedrin.   Fibromyalgia, osteoarthritis     It will be difficult to get supplies for her CPAP based on the poor compliance data so I urged her to use her CPAP for 30 days, I do not have the machine available here only the downloads from the computer.  My goal would be for her to use the machine for 30 days 4 hours minimum daily call the DME or us  to get a download.  The data that I have reviewed today were from 8-1 2024 there is nothing more recent.  Also I can help her to get a replacement mask but all other supplies have to come from the medical equipment company.    1) I Gave the patient a new nasal pillow mask. I encouraged CPAP use - this will be the last time- I will no longer follow up  if she doesn't make this effort - and the patient will be dismissed for continued non-compliance   2) weight loss is needed.  She is swimming and does help ROM, limberness, but it wont improve muscle tone.   3) extensive review of sleep hygiene.   RV only if compliant -  prn.    I would like to thank Dr Ines for allowing  me to meet with and to take care of this pleasant patient.   The patient's condition requires frequent monitoring and adjustments in the treatment plan, reflecting the ongoing complexity of care.   After spending a total time of  25  minutes face to face and time for  history taking, physical and neurologic examination, review of laboratory studies,  personal review of imaging studies, reports and results of other testing and review of referral information / records as far as provided in visit,   Electronically signed by: Dedra Gores, MD 11/05/2023 11:17 AM  [facility] Board certified by The [employer] of Sleep Medicine and Diplomate of the [employer] of Sleep Medicine. Board certified In Neurology through the ABPN, Fellow of the [employer] of Neurology.

## 2023-11-05 NOTE — Patient Instructions (Addendum)
 Living With Sleep Apnea Sleep apnea is a condition that affects your breathing while you're sleeping. Your tongue or the tissue in your throat may block the flow of air while you sleep. You may have shallow breathing or stop breathing for short periods of time. The breaks in breathing interrupt the deep sleep that you need to feel rested. Even if you don't wake up from the gaps in breathing, you may feel tired during the day. People with sleep apnea may snore loudly. You may have a headache in the morning and feel anxious or depressed. How can sleep apnea affect me? Sleep apnea increases your chances of being very tired during the day. This is called daytime fatigue. Sleep apnea can also increase your risk of: Heart attack. Stroke. Obesity. Type 2 diabetes. Heart failure. Irregular heartbeat. High blood pressure. If you are very tired during the day, you may be more likely to: Not do well in school or at work. Fall asleep while driving. Have trouble paying attention. Develop depression or anxiety. Have problems having sex. This is called sexual dysfunction. What actions can I take to manage sleep apnea? Sleep apnea treatment  If you were given a device to open your airway while you sleep, use it only as told by your health care provider. You may be given: An oral appliance. This is a mouthpiece that shifts your lower jaw forward. A continuous positive airway pressure (CPAP) device. This blows air through a mask. A nasal expiratory positive airway pressure (EPAP) device. This has valves that you put into each nostril. A bi-level positive airway pressure (BIPAP) device. This blows air through a mask when you breathe in and breathe out. You may need surgery if other treatments don't work for you. Sleep habits Go to sleep and wake up at the same time every day. This helps set your internal clock for sleeping. If you stay up later than usual on weekends, try to get up in the morning within 2  hours of the time you usually wake up. Try to get at least 7-9 hours of sleep each night. Stop using a computer, tablet, and mobile phone a few hours before bedtime. Do not take long naps during the day. If you nap, limit it to 30 minutes. Have a relaxing bedtime routine. Reading or listening to music may relax you and help you sleep. Use your bedroom only for sleep. Keep your television and computer out of your bedroom. Keep your bedroom cool, dark, and quiet. Use a supportive mattress and pillows. Follow your provider's instructions for other changes to sleep habits. Nutrition Do not eat big meals in the evening. Do not have caffeine  in the later part of the day. The effects of caffeine  can last for more than 5 hours. Follow your provider's instructions for any changes to what you eat and drink. Lifestyle Do not drink alcohol before bedtime. Alcohol can cause you to fall asleep at first, but then it can cause you to wake up in the middle of the night and have trouble getting back to sleep. Do not smoke, vape, or use nicotine or tobacco. Medicines Take over-the-counter and prescription medicines only as told by your provider. Do not use over-the-counter sleep medicine. You may become dependent on this medicine, and it can make sleep apnea worse. Do not take medicines, such as sedatives and narcotics, unless told to by your provider. Activity Exercise on most days, but avoid exercising in the evening. Exercising near bedtime can interfere with sleeping.  If possible, spend time outside every day. Natural light helps with your internal clock. General information Lose weight if you need to. Stay at a healthy weight. If you are having surgery, make sure to tell your provider that you have sleep apnea. You may need to bring your device with you. Keep all follow-up visits. Your provider will want to check on your condition. Where to find more information National Heart, Lung, and Blood  Institute: [employer] This information is not intended to replace advice given to you by your health care provider. Make sure you discuss any questions you have with your health care provider. Document Revised: 08/01/2022 Document Reviewed: 08/01/2022 Elsevier Patient Education  2024 Elsevier Inc.  Healthy Living: Sleep In this video, you will learn why sleep is an important part of a healthy lifestyle. To view the content, go to this web address: https://pe.elsevier.[email or web address]  This video will expire on: 04/03/2025. If you need access to this video following this date, please reach out to the healthcare provider who assigned it to you. This information is not intended to replace advice given to you by your health care provider. Make sure you discuss any questions you have with your health care provider. Elsevier Patient Education  2024 Elsevier Inc.   ASSESSMENT AND PLAN :   63 y.o. year old AA female patient of Dr Ines, Dr Michele and [name], NP     here with:  high fatigue, not sleeping well, but non -compliant OSA on CPAP , apathy.   Orthopnea with obesity and OSA. BMI 50.   Migraine and cluster headaches. GI bleed after overusing Excedrin.   Fibromyalgia, osteoarthritis     It will be difficult to get supplies for her CPAP based on the poor compliance data so I urged her to use her CPAP for 30 days, I do not have the machine available here only the downloads from the computer.  My goal would be for her to use the machine for 30 days 4 hours minimum daily call the DME or us  to get a download.  The data that I have reviewed today were from 8-1 2024 there is nothing more recent.  Also I can help her to get a replacement mask but all other supplies have to come from the medical equipment company.    1) I gave the patient a new nasal pillow mask. ResMed P10  I encouraged CPAP use - this will be the last time- I will no longer follow up  if she doesn't make this effort -  and the patient will be dismissed for continued non-compliance   2) weight loss is needed.  She is swimming and does help ROM, limberness, but it wont improve muscle tone.   3) extensive review of sleep hygiene.    I would like to thank Dr Ines for allowing me to meet with and to take care of this pleasant patient.   The patient's condition requires frequent monitoring and adjustments in the treatment plan, reflecting the ongoing complexity of care.   After spending a total time of  25  minutes face to face and time for  history taking, physical and neurologic examination, review of laboratory studies,  personal review of imaging studies, reports and results of other testing and review of referral information / records as far as provided in visit,   Electronically signed by: Dedra Gores, MD 11/05/2023 11:17 AM  [facility] Board certified by [employer] of Sleep  Medicine and Diplomate of the American Academy of Sleep Medicine. Board certified In Neurology through the ABPN, Fellow of the [employer] of [employer].

## 2023-11-10 ENCOUNTER — Other Ambulatory Visit: Payer: Self-pay | Admitting: Nurse Practitioner

## 2023-11-12 ENCOUNTER — Encounter: Payer: Self-pay | Admitting: Nurse Practitioner

## 2023-11-12 ENCOUNTER — Ambulatory Visit (INDEPENDENT_AMBULATORY_CARE_PROVIDER_SITE_OTHER): Admitting: Nurse Practitioner

## 2023-11-12 VITALS — BP 110/80 | HR 95 | Temp 98.6°F | Ht 62.0 in | Wt 270.2 lb

## 2023-11-12 DIAGNOSIS — L409 Psoriasis, unspecified: Secondary | ICD-10-CM | POA: Diagnosis not present

## 2023-11-12 DIAGNOSIS — N390 Urinary tract infection, site not specified: Secondary | ICD-10-CM | POA: Diagnosis not present

## 2023-11-12 DIAGNOSIS — I10 Essential (primary) hypertension: Secondary | ICD-10-CM

## 2023-11-12 DIAGNOSIS — E782 Mixed hyperlipidemia: Secondary | ICD-10-CM | POA: Diagnosis not present

## 2023-11-12 DIAGNOSIS — R319 Hematuria, unspecified: Secondary | ICD-10-CM

## 2023-11-12 DIAGNOSIS — N3001 Acute cystitis with hematuria: Secondary | ICD-10-CM

## 2023-11-12 DIAGNOSIS — G4730 Sleep apnea, unspecified: Secondary | ICD-10-CM

## 2023-11-12 DIAGNOSIS — R3915 Urgency of urination: Secondary | ICD-10-CM

## 2023-11-12 DIAGNOSIS — Z6841 Body Mass Index (BMI) 40.0 and over, adult: Secondary | ICD-10-CM

## 2023-11-12 DIAGNOSIS — R8290 Unspecified abnormal findings in urine: Secondary | ICD-10-CM | POA: Diagnosis not present

## 2023-11-12 DIAGNOSIS — E11620 Type 2 diabetes mellitus with diabetic dermatitis: Secondary | ICD-10-CM | POA: Diagnosis not present

## 2023-11-12 LAB — POCT URINALYSIS DIP (CLINITEK)
Bilirubin, UA: NEGATIVE
Glucose, UA: NEGATIVE mg/dL
Ketones, POC UA: NEGATIVE mg/dL
Nitrite, UA: POSITIVE — AB
POC PROTEIN,UA: NEGATIVE
Spec Grav, UA: 1.02 (ref 1.010–1.025)
Urobilinogen, UA: 0.2 U/dL
pH, UA: 6 (ref 5.0–8.0)

## 2023-11-12 MED ORDER — NITROFURANTOIN MONOHYD MACRO 100 MG PO CAPS
100.0000 mg | ORAL_CAPSULE | Freq: Two times a day (BID) | ORAL | 0 refills | Status: AC
Start: 2023-11-12 — End: 2023-11-19

## 2023-11-12 MED ORDER — OZEMPIC (2 MG/DOSE) 8 MG/3ML ~~LOC~~ SOPN: 2.0000 mg | PEN_INJECTOR | SUBCUTANEOUS | 1 refills | Status: DC

## 2023-11-12 NOTE — Progress Notes (Signed)
 LILLETTE Kristeen JINNY Gladis, CMA,acting as a Neurosurgeon for Ashley Ada, FNP.,have documented all relevant documentation on the behalf of Ashley Ada, FNP,as directed by  Ashley Ada, FNP while in the presence of Ashley Ada, FNP.  Subjective:  Patient ID: Ashley Lindsey , female    DOB: 08-Jan-1961 , 63 y.o.   MRN: [id number]  Chief Complaint  Patient presents with   Hypertension    Patient presents today for a bp and dm follow up, Patient reports compliance with medication. Patient denies any chest pain, SOB, or headaches.    Urinary Frequency    Patient reports she has urinary frequency and urine odor that first started about a week ago.     HPI  She was diagnosed with psoriasis by the dermatologist. She has been going to the pool. Dr. JONELLE advised her Fibromyalgia is getting more aggressive and needs a PA. She declined a pain patch. She continues to have gabapentin  and causing her grogginess and slow energy. She is eating a smoothie in the morning and a light dinner. 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     Hypertension This is a chronic problem. The current episode started more than 1 year ago. The problem has been gradually improving since onset. The problem is controlled. Pertinent negatives include no anxiety, chest pain, headaches or palpitations. Risk factors for coronary artery disease include sedentary lifestyle and obesity. There are no compliance problems.  There is no history of kidney disease. Identifiable causes of hypertension include sleep apnea. There is no history of chronic renal disease.  Diabetes She presents for her follow-up diabetic visit. She has type 2 diabetes mellitus. Pertinent negatives for hypoglycemia include no headaches. Pertinent negatives for diabetes include no chest pain. There are no hypoglycemic complications. Diabetic complications include heart disease. Risk factors for coronary artery disease include diabetes mellitus,  hypertension, obesity and sedentary lifestyle. Current diabetic treatments: GLP1. She is following a generally unhealthy diet. When asked about meal planning, she reported none. She participates in exercise three times a week. Eye exam is current (need to get records from eye doctor).     Past Medical History:  Diagnosis Date   Abdominal cramps    Anxiety    Arthritis    breast cancer    s/p radiation- last dose 6/12//has been off Tamoxifen  8/12   Chronic headaches    Costochondritis    Dyspnea 09/21/2014   Fatigue    Fibromyalgia 03/06/2013   GERD (gastroesophageal reflux disease)    History of COVID-19    Hypertension    Lymphedema    RIGHT ARM-////  STATES USE LEFT ARM FOR BP'S   Migraines    Muscle spasms of head and/or neck    following to the breast   Passed out    4th of july   Personal history of radiation therapy 2010   lt breast    S/P radiation therapy 08/19/07 - 10/10/07   Left Breast/5040 cGy/28 fractions with Boost for a toatl dose of 6300 dGy   S/P radiation therapy 08/14/10 -10/03/10   right breast   Sleep apnea    STOP BANG SCORE 5     Family History  Problem Relation Age of Onset   Breast cancer Mother 23   Heart disease Father    Diabetes Sister    Prostate cancer Maternal Grandfather 40   Cancer Maternal Aunt 63       d. from female cancer  possibly cervical   Breast cancer Maternal Aunt 60   Prostate cancer Maternal Uncle 78   Prostate cancer Maternal Uncle 80   Prostate cancer Paternal Uncle    Prostate cancer Paternal Uncle    Breast cancer Cousin 30       mat 1st cousin     Current Outpatient Medications:    acetaminophen  (TYLENOL ) 650 MG CR tablet, Take 650 mg by mouth every 8 (eight) hours as needed for pain., Disp: , Rfl:    albuterol  (VENTOLIN  HFA) 108 (90 Base) MCG/ACT inhaler, Inhale 2 puffs into the lungs every 4 (four) hours as needed for wheezing or shortness of breath., Disp: 1 each, Rfl: 0   Bisacodyl (DULCOLAX PO), Take 1  capsule by mouth daily as needed (constipation). , Disp: , Rfl:    cholecalciferol  5000 units TABS, Take 1 tablet (5,000 Units total) by mouth 2 (two) times daily. (Patient taking differently: Take 4,000 Units by mouth daily.), Disp: , Rfl:    dexlansoprazole  (DEXILANT ) 60 MG capsule, TAKE 1 CAPSULE BY MOUTH EVERY DAY, Disp: 90 capsule, Rfl: 1   diclofenac  Sodium (VOLTAREN  ARTHRITIS PAIN) 1 % GEL, Apply 2 g topically 4 (four) times daily., Disp: 100 g, Rfl: 1   diphenhydrAMINE  (BENADRYL ) 25 MG tablet, Take 1 tablet (25 mg total) by mouth every 6 (six) hours as needed for up to 20 doses (migraine). Take with reglan  for migraine headache, Disp: 20 tablet, Rfl: 0   ELDERBERRY PO, Take 1 tablet by mouth daily., Disp: , Rfl:    ferrous sulfate 324 (65 Fe) MG TBEC, Take 325 mg by mouth 2 (two) times daily., Disp: , Rfl:    fish oil-omega-3 fatty acids  1000 MG capsule, Take 1 g by mouth daily., Disp: , Rfl:    Flaxseed, Linseed, (FLAXSEED OIL MAX STR PO), Take 1 capsule by mouth daily. , Disp: , Rfl:    gabapentin  (NEURONTIN ) 600 MG tablet, Take 1 tablet (600 mg total) by mouth in the morning, at noon, in the evening, and at bedtime., Disp: 120 tablet, Rfl: 3   meloxicam  (MOBIC ) 7.5 MG tablet, TAKE 1 TABLET BY MOUTH EVERY DAY, Disp: 90 tablet, Rfl: 1   Multiple Vitamins-Minerals (MULTIVITAMIN & MINERAL PO), Take 1 tablet by mouth daily., Disp: , Rfl:    mupirocin  ointment (BACTROBAN ) 2 %, Apply 1 Application topically 2 (two) times daily. (Patient taking differently: Apply 1 Application topically as needed.), Disp: 22 g, Rfl: 0   ondansetron  (ZOFRAN ) 4 MG tablet, TAKE 1 TABLET BY MOUTH DAILY AS NEEDED FOR NAUSEA OR VOMITING, Disp: 30 tablet, Rfl: 1   OVER THE COUNTER MEDICATION, Take 125 mg by mouth daily. [place]/Simethicone, Disp: , Rfl:    polyethylene glycol (MIRALAX / GLYCOLAX) 17 g packet, Take 17 g by mouth daily as needed., Disp: , Rfl:    propranolol  (INDERAL ) 10 MG tablet, Take 1 tablet (10 mg  total) by mouth 2 (two) times daily. *HOLD IF SYSTOLIC BP (TOP #) IS <100MMHG OR PULSE IS <60BPM, Disp: 180 tablet, Rfl: 3   raNITIdine HCl (RANITIDINE 75 PO), Take 75 mg by mouth daily as needed., Disp: , Rfl:    sacubitril-valsartan (ENTRESTO ) 24-26 MG, Take 1 tablet by mouth 2 (two) times daily., Disp: 180 tablet, Rfl: 3   Semaglutide , 2 MG/DOSE, (OZEMPIC , 2 MG/DOSE,) 8 MG/3ML SOPN, Inject 2 mg into the skin once a week., Disp: 9 mL, Rfl: 1   spironolactone  (ALDACTONE ) 25 MG tablet, TAKE 1 TABLET BY MOUTH EVERY DAY IN  THE MORNING, Disp: 90 tablet, Rfl: 2   SYMBICORT  80-4.5 MCG/ACT inhaler, INHALE 2 PUFFS INTO THE LUNGS IN THE MORNING AND AT BEDTIME. (Patient taking differently: Inhale 2 puffs into the lungs 2 (two) times daily as needed.), Disp: 30.6 each, Rfl: 1   triamcinolone  cream (KENALOG ) 0.1 %, APPLY TO AFFECTED AREA (RASH) TWICE A DAY AS NEEDED, Disp: 60 g, Rfl: 1   Turmeric 500 MG CAPS, Take by mouth., Disp: , Rfl:    venlafaxine  XR (EFFEXOR -XR) 75 MG 24 hr capsule, TAKE 1 CAPSULE (75 MG TOTAL) BY MOUTH IN THE MORNING AND AT BEDTIME., Disp: 180 capsule, Rfl: 1   vitamin E 200 UNIT capsule, Take 200 Units by mouth daily., Disp: , Rfl:    atorvastatin  (LIPITOR) 10 MG tablet, TAKE 1 TABLET BY MOUTH EVERY DAY AT NIGHT (Patient not taking: Reported on 11/12/2023), Disp: 90 tablet, Rfl: 1   cyclobenzaprine  (FLEXERIL ) 10 MG tablet, TAKE 1 TABLET BY MOUTH EVERY DAY AS NEEDED, Disp: 30 tablet, Rfl: 1   furosemide  (LASIX ) 20 MG tablet, TAKE 1 TABLET BY MOUTH EVERY DAY AS NEEDED, Disp: 90 tablet, Rfl: 1   gabapentin  (NEURONTIN ) 300 MG capsule, TAKE 1 CAPSULE BY MOUTH THREE TIMES A DAY (Patient not taking: Reported on 11/12/2023), Disp: 90 capsule, Rfl: 3   sulfamethoxazole -trimethoprim  (BACTRIM  DS) 800-160 MG tablet, Take 1 tablet by mouth 2 (two) times daily. (Patient not taking: Reported on 11/12/2023), Disp: 10 tablet, Rfl: 0   Suzetrigine  (JOURNAVX ) 50 MG TABS, Take 1 tablet by mouth 2 (two) times  daily as needed. (Patient not taking: Reported on 11/12/2023), Disp: 60 tablet, Rfl: 0   topiramate  (TOPAMAX ) 25 MG tablet, Take 1 tablet (25 mg total) by mouth daily as needed (breakthrough pain). (Patient not taking: Reported on 11/12/2023), Disp: 90 tablet, Rfl: 3   Allergies  Allergen Reactions   Aspirin     unknown   Penicillins Hives and Swelling    Has patient had a PCN reaction causing immediate rash, facial/tongue/throat swelling, SOB or lightheadedness with hypotension: Yes Has patient had a PCN reaction causing severe rash involving mucus membranes or skin necrosis: Yes Has patient had a PCN reaction that required hospitalization Yes Has patient had a PCN reaction occurring within the last 10 years: No If all of the above answers are NO, then may proceed with Cephalosporin use.      Review of Systems  Constitutional: Negative.   Respiratory: Negative.    Cardiovascular:  Negative for chest pain, palpitations and leg swelling.  Genitourinary:  Positive for frequency.  Neurological:  Negative for headaches.     Today's Vitals   11/12/23 1159  BP: 110/80  Pulse: 95  Temp: 98.6 F (37 C)  TempSrc: Oral  Weight: 270 lb 3.2 oz (122.6 kg)  Height: 5' 2 (1.575 m)  PainSc: 7   PainLoc: Back   Body mass index is 49.42 kg/m.  Wt Readings from Last 3 Encounters:  11/12/23 270 lb 3.2 oz (122.6 kg)  11/05/23 273 lb 3.2 oz (123.9 kg)  10/15/23 271 lb 12.8 oz (123.3 kg)     Objective:  Physical Exam Vitals and nursing note reviewed.  Constitutional:      General: She is not in acute distress.    Appearance: Normal appearance. She is obese.  HENT:     Head: Normocephalic and atraumatic.  Eyes:     Conjunctiva/sclera: Conjunctivae normal.     Pupils: Pupils are equal, round, and reactive to light.  Cardiovascular:  Rate and Rhythm: Normal rate and regular rhythm.     Pulses: Normal pulses.     Heart sounds: Normal heart sounds. No murmur heard. Pulmonary:      Effort: Pulmonary effort is normal. No respiratory distress.     Breath sounds: Normal breath sounds. No wheezing.  Musculoskeletal:        General: No swelling.  Skin:    General: Skin is warm and dry.  Neurological:     General: No focal deficit present.     Mental Status: She is alert and oriented to person, place, and time.     Cranial Nerves: No cranial nerve deficit.     Motor: No weakness.  Psychiatric:        Attention and Perception: Attention normal.        Mood and Affect: Mood and affect normal. Mood is not depressed.        Speech: Speech normal.        Behavior: Behavior normal.        Thought Content: Thought content normal.        Cognition and Memory: Cognition normal.        Judgment: Judgment normal.      Assessment And Plan:  Type 2 diabetes mellitus with diabetic dermatitis, without long-term current use of insulin (HCC) Assessment & Plan: Hemoglobin A1c is stable.  Continue current medications, tolerating well. She is tolerating Ozempic  well and would like to increase the dose. Will increase to 2 mg weekly.   Orders: -     Hemoglobin A1c -     Lipid panel -     Ozempic  (2 MG/DOSE); Inject 2 mg into the skin once a week.  Dispense: 9 mL; Refill: 1  Mixed hyperlipidemia Assessment & Plan: Cholesterol levels are stable, continue low fat diet  Orders: -     BMP8+eGFR  Essential hypertension Assessment & Plan: Blood pressure is slightly elevated diastolic on the border, she is advised to focus on lifestyle modifications. continue current medications  Orders: -     BMP8+eGFR  Abnormal urine odor Assessment & Plan: Positive nitrates, will check urine culture and treat with nitrofuratoin.    Morbid obesity with BMI of 45.0-49.9, adult [facility]) Assessment & Plan: Weight down 10 lbs since March. Continue focusing on healthy diet and regular exercise    Psoriasis Assessment & Plan: Continue f/u with dermatology   Sleep apnea with use of continuous  positive airway pressure (CPAP) Assessment & Plan: Continue f/u with sleep provider.    Urinary tract infection with hematuria, site unspecified Assessment & Plan: Positive nitrates, will check urine culture and treat with nitrofuratoin.   Orders: -     POCT URINALYSIS DIP (CLINITEK) -     Urine Culture -     Nitrofurantoin  Monohyd Macro; Take 1 capsule (100 mg total) by mouth 2 (two) times daily for 7 days.  Dispense: 14 capsule; Refill: 0    Return for keep same next.  Patient was given opportunity to ask questions. Patient verbalized understanding of the plan and was able to repeat key elements of the plan. All questions were answered to their satisfaction.    LILLETTE Ashley Ada, FNP, have reviewed all documentation for this visit. The documentation on 11/12/23 for the exam, diagnosis, procedures, and orders are all accurate and complete.   IF YOU HAVE BEEN REFERRED TO A SPECIALIST, IT MAY TAKE 1-2 WEEKS TO SCHEDULE/PROCESS THE REFERRAL. IF YOU HAVE NOT HEARD FROM US /SPECIALIST IN TWO  WEEKS, PLEASE GIVE US  A CALL AT [phone].

## 2023-11-13 ENCOUNTER — Other Ambulatory Visit: Payer: Self-pay | Admitting: Physical Medicine and Rehabilitation

## 2023-11-13 LAB — BMP8+EGFR
BUN/Creatinine Ratio: 21 (ref 12–28)
BUN: 12 mg/dL (ref 8–27)
CO2: 25 mmol/L (ref 20–29)
Calcium: 9.3 mg/dL (ref 8.7–10.3)
Chloride: 100 mmol/L (ref 96–106)
Creatinine, Ser: 0.58 mg/dL (ref 0.57–1.00)
Glucose: 76 mg/dL (ref 70–99)
Potassium: 4.8 mmol/L (ref 3.5–5.2)
Sodium: 140 mmol/L (ref 134–144)
eGFR: 101 mL/min/1.73 (ref >59)

## 2023-11-13 LAB — HEMOGLOBIN A1C
Est. average glucose Bld gHb Est-mCnc: 120 mg/dL
Hgb A1c MFr Bld: 5.8 % — ABNORMAL HIGH (ref 4.8–5.6)

## 2023-11-13 LAB — LIPID PANEL
Chol/HDL Ratio: 2.9 ratio (ref 0.0–4.4)
Cholesterol, Total: 215 mg/dL — ABNORMAL HIGH (ref 100–199)
HDL: 73 mg/dL (ref >39)
LDL Chol Calc (NIH): 120 mg/dL — ABNORMAL HIGH (ref 0–99)
Triglycerides: 129 mg/dL (ref 0–149)
VLDL Cholesterol Cal: 22 mg/dL (ref 5–40)

## 2023-11-14 ENCOUNTER — Ambulatory Visit: Payer: Self-pay | Admitting: Nurse Practitioner

## 2023-11-14 LAB — URINE CULTURE

## 2023-11-18 ENCOUNTER — Inpatient Hospital Stay: Admitting: Adult Health

## 2023-11-18 ENCOUNTER — Telehealth: Payer: Self-pay

## 2023-11-18 ENCOUNTER — Telehealth: Payer: Self-pay | Admitting: Adult Health

## 2023-11-18 NOTE — Telephone Encounter (Signed)
 Returned pts phone call requesting information about getting an updated compression sleeve. Let her know that [facility] in [place] is in contract with [facility] [employer] and that would be her best option to start with. Pt appreciative of info and I will send her name and phone number of [facility] in MyChart message as well. Also informed her that theses should ideally be replaced every 6-9 months. Pt verbalized understanding.

## 2023-11-18 NOTE — Telephone Encounter (Signed)
 Called patient to see if she would still be able to make 8:40 AM appointment with Morna Kendall, NP this morning. Patient states that she forgot about the appointment and would like to rescheduled.  Scheduling message sent to get her r/s.  During call, patient also noted that [facility] does not accept her insurance. Message routed to provider to assist in resenting order for new compression sleeve.

## 2023-11-19 ENCOUNTER — Other Ambulatory Visit: Payer: Self-pay | Admitting: Nurse Practitioner

## 2023-11-20 DIAGNOSIS — L409 Psoriasis, unspecified: Secondary | ICD-10-CM | POA: Insufficient documentation

## 2023-11-20 NOTE — Assessment & Plan Note (Signed)
 Weight down 10 lbs since March. Continue focusing on healthy diet and regular exercise

## 2023-11-20 NOTE — Assessment & Plan Note (Signed)
 Continue f/u with dermatology.

## 2023-11-20 NOTE — Assessment & Plan Note (Signed)
 Hemoglobin A1c is stable.  Continue current medications, tolerating well. She is tolerating Ozempic  well and would like to increase the dose. Will increase to 2 mg weekly.

## 2023-11-20 NOTE — Assessment & Plan Note (Signed)
 Blood pressure is slightly elevated diastolic on the border, she is advised to focus on lifestyle modifications. continue current medications

## 2023-11-20 NOTE — Assessment & Plan Note (Signed)
 Continue f/u with sleep provider.

## 2023-11-20 NOTE — Assessment & Plan Note (Signed)
 Cholesterol levels are stable, continue low fat diet

## 2023-11-20 NOTE — Assessment & Plan Note (Addendum)
 Positive nitrates, will check urine culture and treat with nitrofuratoin.

## 2023-11-21 ENCOUNTER — Encounter: Admitting: Adult Health

## 2023-11-28 ENCOUNTER — Other Ambulatory Visit: Payer: Self-pay | Admitting: *Deleted

## 2023-11-28 ENCOUNTER — Inpatient Hospital Stay: Attending: Physician Assistant | Admitting: Physician Assistant

## 2023-11-28 VITALS — BP 137/83 | HR 81 | Temp 97.5°F | Resp 20 | Wt 275.9 lb

## 2023-11-28 DIAGNOSIS — G4730 Sleep apnea, unspecified: Secondary | ICD-10-CM | POA: Diagnosis not present

## 2023-11-28 DIAGNOSIS — Z170 Estrogen receptor positive status [ER+]: Secondary | ICD-10-CM

## 2023-11-28 DIAGNOSIS — C50212 Malignant neoplasm of upper-inner quadrant of left female breast: Secondary | ICD-10-CM | POA: Diagnosis not present

## 2023-11-28 DIAGNOSIS — Z923 Personal history of irradiation: Secondary | ICD-10-CM | POA: Insufficient documentation

## 2023-11-28 DIAGNOSIS — M79601 Pain in right arm: Secondary | ICD-10-CM | POA: Insufficient documentation

## 2023-11-28 DIAGNOSIS — Z8042 Family history of malignant neoplasm of prostate: Secondary | ICD-10-CM | POA: Insufficient documentation

## 2023-11-28 DIAGNOSIS — Z7951 Long term (current) use of inhaled steroids: Secondary | ICD-10-CM | POA: Insufficient documentation

## 2023-11-28 DIAGNOSIS — Z803 Family history of malignant neoplasm of breast: Secondary | ICD-10-CM | POA: Diagnosis not present

## 2023-11-28 DIAGNOSIS — Z791 Long term (current) use of non-steroidal anti-inflammatories (NSAID): Secondary | ICD-10-CM | POA: Diagnosis not present

## 2023-11-28 DIAGNOSIS — Z79899 Other long term (current) drug therapy: Secondary | ICD-10-CM | POA: Insufficient documentation

## 2023-11-28 DIAGNOSIS — Z90722 Acquired absence of ovaries, bilateral: Secondary | ICD-10-CM | POA: Diagnosis not present

## 2023-11-28 DIAGNOSIS — Z8673 Personal history of transient ischemic attack (TIA), and cerebral infarction without residual deficits: Secondary | ICD-10-CM | POA: Diagnosis not present

## 2023-11-28 DIAGNOSIS — M797 Fibromyalgia: Secondary | ICD-10-CM | POA: Insufficient documentation

## 2023-11-28 DIAGNOSIS — Z853 Personal history of malignant neoplasm of breast: Secondary | ICD-10-CM | POA: Insufficient documentation

## 2023-11-28 DIAGNOSIS — Z90710 Acquired absence of both cervix and uterus: Secondary | ICD-10-CM | POA: Diagnosis not present

## 2023-11-28 DIAGNOSIS — Z87891 Personal history of nicotine dependence: Secondary | ICD-10-CM | POA: Insufficient documentation

## 2023-11-28 NOTE — Progress Notes (Signed)
 Symptom Management Consult Note  Cancer Center    Patient Care Team: Georgina Speaks, FNP as PCP - General (General Practice) Michele Richardson, DO as PCP - Cardiology (Cardiology) Shelah Lamar RAMAN, MD as Consulting Physician (Pulmonary Disease) Ladora, My Ali Molina, [place] as Referring Physician (Optometry) Ines Onetha NOVAK, MD as Referring Physician (Neurology)    Name / MRN / DOB: Ashley Lindsey  [id number]  06-03-1960   Date of visit: 11/28/2023   Chief Complaint/Reason for visit: right arm pain    ASSESSMENT AND PLAN Patient is a 63 y.o. female with oncologic history of breast cancer of upper-inner quadrant of left female breastfollowed by Dr. Odean.  I have viewed most recent oncology note and lab work.  #Breast cancer of upper-inner quadrant of left female breast  - Completed Tamoxifen  in 2017 - Next appointment with oncologist is 04/10/2024   #Right arm pain - Physical exam reveals mild edema to RUE, no obvious deformity. - Refer to lymphedema clinic for evaluation and management. Referral placed by RN. - Continue current pain management with gabapentin  and heat application. - Consider further imaging or intervention if breast pain persists or worsens after beginning PT     Strict ED precautions discussed should symptoms worsen.   HEME/ONC HISTORY Oncology History  Breast cancer of upper-inner quadrant of left female breast (HCC)  05/27/2007 Initial Diagnosis   Left breast biopsy: DCIS with calcifications ER/PR 100% positive, MRI revealed 1.7 x 1.4 x 0.9 cm DCIS at 12:00 position left breast   06/20/2007 Surgery   Left breast lumpectomy: T1 N0 M0 stage IA invasive ductal carcinoma 0.4-0.5 cm grade 1, ER 77%, PR 96%, Ki-67 less than 5%, HER-2 negative   08/21/2007 - 10/10/2007 Radiation Therapy   Adjuvant radiation therapy   01/14/2008 - 01/07/2017 Anti-estrogen oral therapy   Tamoxifen  20 mg daily   Breast cancer of lower-outer quadrant of right female breast (HCC)   05/26/2010 Initial Diagnosis   DCIS ER/PR positive   06/16/2010 Surgery   No residual DCIS, extensive fibrocystic changes with focal atypical ductal hyperplasia   08/02/2010 - 09/22/2010 Radiation Therapy   Adjuvant radiation   10/26/2010 - 10/31/2015 Anti-estrogen oral therapy   Tamoxifen  20 mg daily       INTERVAL HISTORY  Discussed the use of AI scribe software for clinical note transcription with the patient, who gave verbal consent to proceed.    Ashley Lindsey is a 63 y.o. female with oncologic history as above presenting to [facility] today with chief complaint of right arm pain. Patient presents unaccompanied to visit today.  She has been experiencing pain in her right arm, intermittently for about a month and radiates to her breast. It sometimes wakes her from sleep, particularly during rainy weather, and is more pronounced when she lies down and places her arm back, causing an aching sensation on the side of her breast.  She has performed self breast exams and denies any palpable masses or lumps. Denies any skin changes to her breast. She does admit to a history of fibromyalgia for which she takes gabapentin  for.  After her pain started, she tried wearing her old compression sleeve again. She was last seen in the lymphedema clinic several years ago. She believes her current sleeve is too small. Her daughter assists with massages using a cooling oil, and she performs exercises such as 'climbing up the wall' and using a squeezy ball.  She recently resumed wearing her compression bra two weeks ago as  well.  She has a history of fibromyalgia, which she believes may contribute to her symptoms, as she experiences inflammation that 'travels' throughout her body. Two years ago, she was told she had a 'light stroke,' and she has noticed that her right side has been problematic since then, including her eyelid. She also reports tenderness in her right breast without any palpable lumps or masses.  She  is retired and engages in activities such as going to the gym and swimming three times a week, although she sometimes reduces activity when not feeling well. She uses a pillow to elevate her arm due to swelling, which she describes as 'puffiness' that she can see in the mirror. Her last mammogram was in December 2024 with plans to continue them yearly.      ROS  All other systems are reviewed and are negative for acute change except as noted in the HPI.    Allergies  Allergen Reactions   Aspirin     unknown   Penicillins Hives and Swelling    Has patient had a PCN reaction causing immediate rash, facial/tongue/throat swelling, SOB or lightheadedness with hypotension: Yes Has patient had a PCN reaction causing severe rash involving mucus membranes or skin necrosis: Yes Has patient had a PCN reaction that required hospitalization Yes Has patient had a PCN reaction occurring within the last 10 years: No If all of the above answers are NO, then may proceed with Cephalosporin use.      Past Medical History:  Diagnosis Date   Abdominal cramps    Anxiety    Arthritis    breast cancer    s/p radiation- last dose 6/12//has been off Tamoxifen  8/12   Chronic headaches    Costochondritis    Dyspnea 09/21/2014   Fatigue    Fibromyalgia 03/06/2013   GERD (gastroesophageal reflux disease)    History of COVID-19    Hypertension    Lymphedema    RIGHT ARM-////  STATES USE LEFT ARM FOR BP'S   Migraines    Muscle spasms of head and/or neck    following to the breast   Passed out    4th of july   Personal history of radiation therapy 2010   lt breast    S/P radiation therapy 08/19/07 - 10/10/07   Left Breast/5040 cGy/28 fractions with Boost for a toatl dose of 6300 dGy   S/P radiation therapy 08/14/10 -10/03/10   right breast   Sleep apnea    STOP BANG SCORE 5     Past Surgical History:  Procedure Laterality Date   ABDOMINAL HYSTERECTOMY     with left salpingooophorectomy    BIOPSY  04/30/2022   Procedure: BIOPSY;  Surgeon: Legrand Victory LITTIE DOUGLAS, MD;  Location: [facility];  Service: Gastroenterology;;   BREAST LUMPECTOMY  2009/2012   LEFT/RIGHT with lymph node dissection   COLONOSCOPY WITH PROPOFOL  N/A 04/30/2022   Procedure: COLONOSCOPY WITH PROPOFOL ;  Surgeon: Legrand Victory LITTIE DOUGLAS, MD;  Location: [facility];  Service: Gastroenterology;  Laterality: N/A;   ESOPHAGOGASTRODUODENOSCOPY (EGD) WITH PROPOFOL  N/A 04/30/2022   Procedure: ESOPHAGOGASTRODUODENOSCOPY (EGD) WITH PROPOFOL ;  Surgeon: Legrand Victory LITTIE DOUGLAS, MD;  Location: [facility];  Service: Gastroenterology;  Laterality: N/A;   HOT HEMOSTASIS N/A 04/30/2022   Procedure: HOT HEMOSTASIS (ARGON PLASMA COAGULATION/BICAP);  Surgeon: Legrand Victory LITTIE DOUGLAS, MD;  Location: [facility];  Service: Gastroenterology;  Laterality: N/A;   KNEE ARTHROSCOPY     right   OOPHORECTOMY  2013    Social  History   Socioeconomic History   Marital status: Divorced    Spouse name: Not on file   Number of children: 1   Years of education: Not on file   Highest education level: Not on file  Occupational History   Occupation: disability  Tobacco Use   Smoking status: Former    Current packs/day: 0.25    Average packs/day: 0.3 packs/day for 1 year (0.3 ttl pk-yrs)    Types: Cigarettes   Smokeless tobacco: Never  Vaping Use   Vaping status: Never Used  Substance and Sexual Activity   Alcohol use: Not Currently   Drug use: No   Sexual activity: Not Currently    Birth control/protection: Surgical  Other Topics Concern   Not on file  Social History Narrative   Right handed    Coffee daily, sometimes Ginger ale    Social Drivers of Health   Financial Resource Strain: Low Risk  (02/13/2023)   Overall Financial Resource Strain (CARDIA)    Difficulty of Paying Living Expenses: Not hard at all  Food Insecurity: No Food Insecurity (02/13/2023)   Hunger Vital Sign    Worried About Running Out of Food in the Last Year: Never true     Ran Out of Food in the Last Year: Never true  Transportation Needs: No Transportation Needs (02/13/2023)   PRAPARE - Administrator, Civil Service (Medical): No    Lack of Transportation (Non-Medical): No  Physical Activity: Inactive (02/13/2023)   Exercise Vital Sign    Days of Exercise per Week: 0 days    Minutes of Exercise per Session: 0 min  Stress: No Stress Concern Present (02/13/2023)   [name] of Occupational Health - Occupational Stress Questionnaire    Feeling of Stress : Not at all  Social Connections: Moderately Integrated (02/13/2023)   Social Connection and Isolation Panel    Frequency of Communication with Friends and Family: More than three times a week    Frequency of Social Gatherings with Friends and Family: More than three times a week    Attends Religious Services: More than 4 times per year    Active Member of [employer] or Organizations: Yes    Attends Engineer, structural: More than 4 times per year    Marital Status: Divorced  Intimate Partner Violence: Not At Risk (02/13/2023)   Humiliation, Afraid, Rape, and Kick questionnaire    Fear of Current or Ex-Partner: No    Emotionally Abused: No    Physically Abused: No    Sexually Abused: No    Family History  Problem Relation Age of Onset   Breast cancer Mother 41   Heart disease Father    Diabetes Sister    Prostate cancer Maternal Grandfather 6   Cancer Maternal Aunt 63       d. from female cancer possibly cervical   Breast cancer Maternal Aunt 60   Prostate cancer Maternal Uncle 78   Prostate cancer Maternal Uncle 80   Prostate cancer Paternal Uncle    Prostate cancer Paternal Uncle    Breast cancer Cousin 30       mat 1st cousin     Current Outpatient Medications:    acetaminophen  (TYLENOL ) 650 MG CR tablet, Take 650 mg by mouth every 8 (eight) hours as needed for pain., Disp: , Rfl:    albuterol  (VENTOLIN  HFA) 108 (90 Base) MCG/ACT inhaler, Inhale 2 puffs into the  lungs every 4 (four) hours as needed for wheezing  or shortness of breath., Disp: 1 each, Rfl: 0   atorvastatin  (LIPITOR) 10 MG tablet, TAKE 1 TABLET BY MOUTH EVERY DAY AT NIGHT (Patient not taking: Reported on 11/12/2023), Disp: 90 tablet, Rfl: 1   Bisacodyl (DULCOLAX PO), Take 1 capsule by mouth daily as needed (constipation). , Disp: , Rfl:    cholecalciferol  5000 units TABS, Take 1 tablet (5,000 Units total) by mouth 2 (two) times daily. (Patient taking differently: Take 4,000 Units by mouth daily.), Disp: , Rfl:    cyclobenzaprine  (FLEXERIL ) 10 MG tablet, TAKE 1 TABLET BY MOUTH EVERY DAY AS NEEDED, Disp: 30 tablet, Rfl: 1   dexlansoprazole  (DEXILANT ) 60 MG capsule, TAKE 1 CAPSULE BY MOUTH EVERY DAY, Disp: 90 capsule, Rfl: 1   diclofenac  Sodium (VOLTAREN  ARTHRITIS PAIN) 1 % GEL, Apply 2 g topically 4 (four) times daily., Disp: 100 g, Rfl: 1   diphenhydrAMINE  (BENADRYL ) 25 MG tablet, Take 1 tablet (25 mg total) by mouth every 6 (six) hours as needed for up to 20 doses (migraine). Take with reglan  for migraine headache, Disp: 20 tablet, Rfl: 0   ELDERBERRY PO, Take 1 tablet by mouth daily., Disp: , Rfl:    ferrous sulfate 324 (65 Fe) MG TBEC, Take 325 mg by mouth 2 (two) times daily., Disp: , Rfl:    fish oil-omega-3 fatty acids  1000 MG capsule, Take 1 g by mouth daily., Disp: , Rfl:    Flaxseed, Linseed, (FLAXSEED OIL MAX STR PO), Take 1 capsule by mouth daily. , Disp: , Rfl:    furosemide  (LASIX ) 20 MG tablet, TAKE 1 TABLET BY MOUTH EVERY DAY AS NEEDED, Disp: 90 tablet, Rfl: 1   gabapentin  (NEURONTIN ) 300 MG capsule, TAKE 1 CAPSULE BY MOUTH THREE TIMES A DAY (Patient not taking: Reported on 11/12/2023), Disp: 90 capsule, Rfl: 3   gabapentin  (NEURONTIN ) 600 MG tablet, Take 1 tablet (600 mg total) by mouth in the morning, at noon, in the evening, and at bedtime., Disp: 120 tablet, Rfl: 3   meloxicam  (MOBIC ) 7.5 MG tablet, TAKE 1 TABLET BY MOUTH EVERY DAY, Disp: 90 tablet, Rfl: 1   Multiple  Vitamins-Minerals (MULTIVITAMIN & MINERAL PO), Take 1 tablet by mouth daily., Disp: , Rfl:    mupirocin  ointment (BACTROBAN ) 2 %, Apply 1 Application topically 2 (two) times daily. (Patient taking differently: Apply 1 Application topically as needed.), Disp: 22 g, Rfl: 0   ondansetron  (ZOFRAN ) 4 MG tablet, TAKE 1 TABLET BY MOUTH DAILY AS NEEDED FOR NAUSEA OR VOMITING, Disp: 30 tablet, Rfl: 1   OVER THE COUNTER MEDICATION, Take 125 mg by mouth daily. [place]/Simethicone, Disp: , Rfl:    polyethylene glycol (MIRALAX / GLYCOLAX) 17 g packet, Take 17 g by mouth daily as needed., Disp: , Rfl:    propranolol  (INDERAL ) 10 MG tablet, Take 1 tablet (10 mg total) by mouth 2 (two) times daily. *HOLD IF SYSTOLIC BP (TOP #) IS <100MMHG OR PULSE IS <60BPM, Disp: 180 tablet, Rfl: 3   raNITIdine HCl (RANITIDINE 75 PO), Take 75 mg by mouth daily as needed., Disp: , Rfl:    sacubitril-valsartan (ENTRESTO ) 24-26 MG, Take 1 tablet by mouth 2 (two) times daily., Disp: 180 tablet, Rfl: 3   Semaglutide , 2 MG/DOSE, (OZEMPIC , 2 MG/DOSE,) 8 MG/3ML SOPN, Inject 2 mg into the skin once a week., Disp: 9 mL, Rfl: 1   spironolactone  (ALDACTONE ) 25 MG tablet, TAKE 1 TABLET BY MOUTH EVERY DAY IN THE MORNING, Disp: 90 tablet, Rfl: 2   sulfamethoxazole -trimethoprim  (BACTRIM  DS) 800-160 MG  tablet, Take 1 tablet by mouth 2 (two) times daily. (Patient not taking: Reported on 11/12/2023), Disp: 10 tablet, Rfl: 0   Suzetrigine  (JOURNAVX ) 50 MG TABS, Take 1 tablet by mouth 2 (two) times daily as needed. (Patient not taking: Reported on 11/12/2023), Disp: 60 tablet, Rfl: 0   SYMBICORT  80-4.5 MCG/ACT inhaler, INHALE 2 PUFFS INTO THE LUNGS IN THE MORNING AND AT BEDTIME. (Patient taking differently: Inhale 2 puffs into the lungs 2 (two) times daily as needed.), Disp: 30.6 each, Rfl: 1   topiramate  (TOPAMAX ) 25 MG tablet, Take 1 tablet (25 mg total) by mouth daily as needed (breakthrough pain). (Patient not taking: Reported on 11/12/2023), Disp: 90  tablet, Rfl: 3   triamcinolone  cream (KENALOG ) 0.1 %, APPLY TO AFFECTED AREA (RASH) TWICE A DAY AS NEEDED, Disp: 60 g, Rfl: 1   Turmeric 500 MG CAPS, Take by mouth., Disp: , Rfl:    venlafaxine  XR (EFFEXOR -XR) 75 MG 24 hr capsule, TAKE 1 CAPSULE (75 MG TOTAL) BY MOUTH IN THE MORNING AND AT BEDTIME., Disp: 180 capsule, Rfl: 1   vitamin E 200 UNIT capsule, Take 200 Units by mouth daily., Disp: , Rfl:   PHYSICAL EXAM ECOG FS:1 - Symptomatic but completely ambulatory    Vitals:   11/28/23 1114  BP: 137/83  Pulse: 81  Resp: 20  Temp: (!) 97.5 F (36.4 C)  SpO2: 96%  Weight: 275 lb 14.4 oz (125.1 kg)   Physical Exam Vitals and nursing note reviewed. Exam conducted with a chaperone present.  Constitutional:      Appearance: She is not ill-appearing or toxic-appearing.  HENT:     Head: Normocephalic.  Eyes:     Conjunctiva/sclera: Conjunctivae normal.  Cardiovascular:     Rate and Rhythm: Normal rate.     Pulses:          Radial pulses are 2+ on the right side.  Pulmonary:     Effort: Pulmonary effort is normal.  Chest:     Comments: Diffuse tenderness with palpation of tight breast. No palpable mass. No skin changes. No swelling.  Left breast without tenderness. No palpable mass. No skin changes. No swelling. Abdominal:     General: There is no distension.  Musculoskeletal:     Cervical back: Normal range of motion.     Comments: Decreased ROM of RUE 2/2 to pain. No gross deformity. Tenderness to palpation over right bicep  Lymphadenopathy:     Upper Body:     Right upper body: No axillary adenopathy.     Left upper body: No axillary adenopathy.  Skin:    General: Skin is warm and dry.  Neurological:     Mental Status: She is alert.        LABORATORY DATA I have reviewed the data as listed    Latest Ref Rng & Units 02/22/2023   11:17 AM 07/05/2022   12:15 PM 04/12/2022    4:13 PM  CBC  WBC 3.4 - 10.8 x10E3/uL 9.2  8.9  9.6   Hemoglobin 11.1 - 15.9 g/dL 88.1   87.3  88.9   Hematocrit 34.0 - 46.6 % 39.5  39.3  36.7   Platelets 150 - 450 x10E3/uL 372  351.0  293.0         Latest Ref Rng & Units 11/12/2023   12:37 PM 07/11/2023   12:26 PM 02/22/2023   11:17 AM  CMP  Glucose 70 - 99 mg/dL 76  76  81   BUN 8 -  27 mg/dL 12  10  6    Creatinine 0.57 - 1.00 mg/dL 9.41  9.44  9.40   Sodium 134 - 144 mmol/L 140  142  142   Potassium 3.5 - 5.2 mmol/L 4.8  4.6  4.7   Chloride 96 - 106 mmol/L 100  101  102   CO2 20 - 29 mmol/L 25  27  27    Calcium  8.7 - 10.3 mg/dL 9.3  9.1  8.9   Total Protein 6.0 - 8.5 g/dL   6.8   Total Bilirubin 0.0 - 1.2 mg/dL   <9.7   Alkaline Phos 44 - 121 IU/L   76   AST 0 - 40 IU/L   15   ALT 0 - 32 IU/L   11        RADIOGRAPHIC STUDIES (from last 24 hours if applicable) I have personally reviewed the radiological images as listed and agreed with the findings in the report. No results found.      Visit Diagnosis: 1. Malignant neoplasm of upper-inner quadrant of left breast in female, estrogen receptor positive (HCC)   2. Right arm pain      No orders of the defined types were placed in this encounter.   All questions were answered. The patient knows to call the clinic with any problems, questions or concerns. No barriers to learning was detected.  A total of more than 30 minutes were spent on this encounter with face-to-face time and non-face-to-face time, including preparing to see the patient, ordering tests and/or medications, counseling the patient and coordination of care as outlined above.    Thank you for allowing me to participate in the care of this patient.    Izumi Mixon E  Walisiewicz, PA-C Department of Hematology/Oncology [facility] at [facility] Phone: [phone]  [phone]    11/28/2023 3:15 PM

## 2023-11-28 NOTE — Patient Instructions (Signed)
 Referral sent to the [facility] [address] [place] [address] [phone]

## 2023-12-04 ENCOUNTER — Other Ambulatory Visit: Payer: Self-pay | Admitting: Nurse Practitioner

## 2023-12-04 ENCOUNTER — Telehealth: Payer: Self-pay | Admitting: *Deleted

## 2023-12-04 NOTE — Telephone Encounter (Signed)
 Notes from last visit relating to lymphedema has been faxed to [facility] at [phone]. Receipt of confirmation obtained.

## 2023-12-05 ENCOUNTER — Telehealth: Payer: Self-pay

## 2023-12-05 ENCOUNTER — Telehealth: Payer: Self-pay | Admitting: *Deleted

## 2023-12-05 DIAGNOSIS — R202 Paresthesia of skin: Secondary | ICD-10-CM | POA: Diagnosis not present

## 2023-12-05 DIAGNOSIS — Q800 Ichthyosis vulgaris: Secondary | ICD-10-CM | POA: Diagnosis not present

## 2023-12-05 DIAGNOSIS — L400 Psoriasis vulgaris: Secondary | ICD-10-CM | POA: Diagnosis not present

## 2023-12-05 NOTE — Telephone Encounter (Signed)
 Copied from CRM #[id number]. Topic: Clinical - Order For Equipment >> Dec 05, 2023  1:19 PM Ashley Lindsey wrote: Reason for CRM: Patient is requesting orders to be faxed over to [facility] at [phone] so that she is able to get a medical sleeve to assist in the swelling of her arm. Her oncology doctor sent information but they needs it to specifically state about her swelling. States NP Georgina is already familiar with this swelling and declined NT as it is not new.  Patient can be reached at [phone]

## 2023-12-05 NOTE — Telephone Encounter (Signed)
 Received call from pt stating [facility] needs a more in depth office note for insurance coverage of her sleeve. RN reached out to lymphedema PT who stated when they assess pt next week, they will fax updated documentation to [facility] and will help pt with insurance portion. Pt notified and verbalized understanding.

## 2023-12-06 DIAGNOSIS — G4733 Obstructive sleep apnea (adult) (pediatric): Secondary | ICD-10-CM | POA: Diagnosis not present

## 2023-12-08 NOTE — Telephone Encounter (Signed)
 Did she ask the Oncologist doctor to send the order over again indicating it is for swelling?

## 2023-12-11 NOTE — Therapy (Incomplete)
 OUTPATIENT PHYSICAL THERAPY  UPPER EXTREMITY ONCOLOGY EVALUATION  Patient Name: Ashley Lindsey MRN: [id number] DOB:1960-07-20, 63 y.o., female Today's Date: 12/11/2023  END OF SESSION:   Past Medical History:  Diagnosis Date   Abdominal cramps    Anxiety    Arthritis    breast cancer    s/p radiation- last dose 6/12//has been off Tamoxifen  8/12   Chronic headaches    Costochondritis    Dyspnea 09/21/2014   Fatigue    Fibromyalgia 03/06/2013   GERD (gastroesophageal reflux disease)    History of COVID-19    Hypertension    Lymphedema    RIGHT ARM-////  STATES USE LEFT ARM FOR BP'S   Migraines    Muscle spasms of head and/or neck    following to the breast   Passed out    4th of july   Personal history of radiation therapy 2010   lt breast    S/P radiation therapy 08/19/07 - 10/10/07   Left Breast/5040 cGy/28 fractions with Boost for a toatl dose of 6300 dGy   S/P radiation therapy 08/14/10 -10/03/10   right breast   Sleep apnea    STOP BANG SCORE 5   Past Surgical History:  Procedure Laterality Date   ABDOMINAL HYSTERECTOMY     with left salpingooophorectomy   BIOPSY  04/30/2022   Procedure: BIOPSY;  Surgeon: Legrand Victory LITTIE DOUGLAS, MD;  Location: [facility];  Service: Gastroenterology;;   BREAST LUMPECTOMY  2009/2012   LEFT/RIGHT with lymph node dissection   COLONOSCOPY WITH PROPOFOL  N/A 04/30/2022   Procedure: COLONOSCOPY WITH PROPOFOL ;  Surgeon: Legrand Victory LITTIE DOUGLAS, MD;  Location: [facility];  Service: Gastroenterology;  Laterality: N/A;   ESOPHAGOGASTRODUODENOSCOPY (EGD) WITH PROPOFOL  N/A 04/30/2022   Procedure: ESOPHAGOGASTRODUODENOSCOPY (EGD) WITH PROPOFOL ;  Surgeon: Legrand Victory LITTIE DOUGLAS, MD;  Location: [facility];  Service: Gastroenterology;  Laterality: N/A;   HOT HEMOSTASIS N/A 04/30/2022   Procedure: HOT HEMOSTASIS (ARGON PLASMA COAGULATION/BICAP);  Surgeon: Legrand Victory LITTIE DOUGLAS, MD;  Location: [facility];  Service: Gastroenterology;  Laterality: N/A;   KNEE  ARTHROSCOPY     right   OOPHORECTOMY  2013   Patient Active Problem List   Diagnosis Date Noted   Morbid obesity with BMI of 45.0-49.9, adult (HCC) 11/20/2023   Psoriasis 11/20/2023   Noncompliance with CPAP treatment 11/05/2023   Rash 07/22/2023   Pelvic pain 07/22/2023   Mixed hyperlipidemia 03/20/2023   Encounter for annual health examination 03/14/2023   COVID-19 vaccination declined 03/14/2023   Class 3 severe obesity due to excess calories with serious comorbidity and body mass index (BMI) of 50.0 to 59.9 in adult 03/14/2023   Need for influenza vaccination 03/14/2023   Acute cough 02/20/2023   Right sided abdominal pain 02/19/2023   Hematuria 02/19/2023   Urinary tract infection 12/31/2022   Cervicogenic headache 12/31/2022   Urinary frequency 11/28/2022   Urinary tract infection without hematuria 11/28/2022   Elevated cholesterol 11/20/2022   Vitamin D  deficiency 11/20/2022   Tibialis posterior tendinitis 10/08/2022   OSA (obstructive sleep apnea) 09/04/2022   Claustrophobia 09/04/2022   Suboptimal nutrition (HCC) 07/05/2022   Cardiomyopathy, unspecified type (HCC) 07/05/2022   Essential hypertension 07/05/2022   Type 2 diabetes mellitus with diabetic dermatitis, without long-term current use of insulin (HCC) 07/05/2022   Chronic gastric ulcer without hemorrhage and without perforation 04/30/2022   Gastric AVM 04/30/2022   Iron deficiency anemia 03/06/2022   Unilateral primary osteoarthritis, left knee 10/02/2021   Pulmonary nodules 07/12/2021  Chronic cough 07/12/2021   Avulsion fracture of middle phalanx of finger 04/18/2021   Tibial plateau fracture, left 04/18/2021   Asymmetrical sensorineural hearing loss 02/02/2021   Bilateral primary osteoarthritis of knee 02/09/2020   Chronic intermittent hypoxia with obstructive sleep apnea 08/24/2019   Chronic intractable headache 07/14/2019   Morning headache 07/14/2019   Loud snoring 07/14/2019   Super obesity  07/14/2019   Sleeps in sitting position due to orthopnea 07/14/2019   Chest pain due to GERD 07/14/2019   Nocturia more than twice per night 07/14/2019   Unilateral primary osteoarthritis, right knee 05/26/2019   Chronic migraine without aura without status migrainosus, not intractable 05/13/2019   Class 3 severe obesity due to excess calories without serious comorbidity with body mass index (BMI) of 40.0 to 44.9 in adult 08/05/2018   Depression 08/05/2018   Chronic nonintractable headache 08/05/2018   Chronic pain of right ankle 04/24/2018   Breast cancer of lower-outer quadrant of right female breast (HCC) 12/02/2014   Bronchospasm 09/21/2014   Chronic pain of right knee 09/21/2014   Fibromyalgia 03/06/2013   Dense breasts 03/06/2013   Mastalgia 03/06/2013   Back pain, lumbosacral 03/06/2013   Lymphedema of arm 03/06/2013   Atypical chest pain 01/06/2013   Breast cancer of upper-inner quadrant of left female breast (HCC) 09/08/2012   Family history of malignant neoplasm of breast 09/08/2012   S/P radiation therapy    History of breast cancer in female 08/13/2011    PCP: ***  REFERRING PROVIDER: Mackey Chad, MD  REFERRING DIAG: Right UE lymphedema  THERAPY DIAG:  No diagnosis found.  ONSET DATE: ***  Rationale for Evaluation and Treatment: Rehabilitation  SUBJECTIVE:                                                                                                                                                                                           SUBJECTIVE STATEMENT: ***  PERTINENT HISTORY:  Left breast cancer diagnosed with lumpectomy with 9 lymph nodes removed in 2013; right breast cancer about six months later with lumpectomy and 13 lymph nodes removed in 2014. Diagnosed with fibromyalgia. Had a chemoradiation for both breasts at [facility]. Seen for Left UE swelling in 2019, 2021 but limited visits. Had old sleeves  PAIN:  Are you having pain? {yes/no:20286} NPRS  scale: ***/10 Pain location: *** Pain orientation: {Pain Orientation:25161}  PAIN TYPE: {type:313116} Pain description: {PAIN DESCRIPTION:21022940}  Aggravating factors: *** Relieving factors: ***  PRECAUTIONS: Lymphedema  RED FLAGS: {PT Red Flags:29287}   WEIGHT BEARING RESTRICTIONS: {Yes ***/No:24003}  FALLS:  Has patient fallen in last 6 months? {fallsyesno:27318}  LIVING ENVIRONMENT: Lives  with: {[facility] lives with:25569::lives with their family} Lives in: {Lives in:25570} Stairs: {yes/no:20286}; {Stairs:24000} Has following equipment at home: {Assistive devices:23999}  OCCUPATION: ***  LEISURE: ***  HAND DOMINANCE: right   PRIOR LEVEL OF FUNCTION: Independent  PATIENT GOALS: ***   OBJECTIVE: Note: Objective measures were completed at Evaluation unless otherwise noted.  COGNITION: Overall cognitive status: Within functional limits for tasks assessed   PALPATION: ***  OBSERVATIONS / OTHER ASSESSMENTS: ***  SENSATION: Light touch: {intact/deficits:24005}   POSTURE: forward head, rounded shoulders  UPPER EXTREMITY AROM/PROM:  A/PROM RIGHT   eval   Shoulder extension   Shoulder flexion   Shoulder abduction   Shoulder internal rotation   Shoulder external rotation     (Blank rows = not tested)  A/PROM LEFT   eval  Shoulder extension   Shoulder flexion   Shoulder abduction   Shoulder internal rotation   Shoulder external rotation     (Blank rows = not tested)  CERVICAL AROM: All within fxl limits:      UPPER EXTREMITY STRENGTH:   LYMPHEDEMA ASSESSMENTS:   SEE history   LYMPHEDEMA ASSESSMENTS:   LANDMARK RIGHT  eval  At axilla    15 cm proximal to olecranon process   10 cm proximal to olecranon process   Olecranon process   15 cm proximal to ulnar styloid process   10 cm proximal to ulnar styloid process   Just proximal to ulnar styloid process   Across hand at thumb web space   At base of 2nd digit   (Blank rows = not  tested)  LANDMARK LEFT  eval  At axilla    15 cm proximal to olecranon process   10 cm proximal to olecranon process   Olecranon process   15 cm proximal to ulnar styloid process   10 cm proximal to ulnar styloid process   Just proximal to ulnar styloid process   Across hand at thumb web space   At base of 2nd digit   (Blank rows = not tested)   FUNCTIONAL TESTS:  {Functional tests:24029}  GAIT: Distance walked: *** Assistive device utilized: {Assistive devices:23999} Level of assistance: {Levels of assistance:24026} Comments: ***  L-DEX LYMPHEDEMA SCREENING: The patient was assessed using the L-Dex machine today to produce a lymphedema index baseline score. The patient will be reassessed on a regular basis (typically every 3 months) to obtain new L-Dex scores. If the score is > 6.5 points away from his/her baseline score indicating onset of subclinical lymphedema, it will be recommended to wear a compression garment for 4 weeks, 12 hours per day and then be reassessed. If the score continues to be > 6.5 points from baseline at reassessment, we will initiate lymphedema treatment. Assessing in this manner has a 95% rate of preventing clinically significant lymphedema.  QUICK DASH SURVEY: ***  TREATMENT DATE: ***    PATIENT EDUCATION:  Education details: *** Person educated: {Person educated:25204} Education method: {Education Method:25205} Education comprehension: {Education Comprehension:25206}  HOME EXERCISE PROGRAM: ***  ASSESSMENT:  CLINICAL IMPRESSION: Patient is a 63 y.o. female who was seen today for physical therapy evaluation and treatment for ***.    OBJECTIVE IMPAIRMENTS: {opptimpairments:25111}.   ACTIVITY LIMITATIONS: {activitylimitations:27494}  PARTICIPATION LIMITATIONS: {participationrestrictions:25113}  PERSONAL FACTORS:  {Personal factors:25162} are also affecting patient's functional outcome.   REHAB POTENTIAL: {rehabpotential:25112}  CLINICAL DECISION MAKING: {clinical decision making:25114}  EVALUATION COMPLEXITY: {Evaluation complexity:25115}  GOALS: Goals reviewed with patient? {yes/no:20286}  SHORT TERM GOALS: Target date: ***  *** Baseline: Goal status: INITIAL  2.  *** Baseline:  Goal status: INITIAL  3.  *** Baseline:  Goal status: INITIAL  4.  *** Baseline:  Goal status: INITIAL  5.  *** Baseline:  Goal status: INITIAL  6.  *** Baseline:  Goal status: INITIAL  LONG TERM GOALS: Target date: ***  *** Baseline:  Goal status: INITIAL  2.  *** Baseline:  Goal status: INITIAL  3.  *** Baseline:  Goal status: INITIAL  4.  *** Baseline:  Goal status: INITIAL  5.  *** Baseline:  Goal status: INITIAL  6.  *** Baseline:  Goal status: INITIAL  PLAN:  PT FREQUENCY: {rehab frequency:25116}  PT DURATION: {rehab duration:25117}  PLANNED INTERVENTIONS: {rehab planned interventions:25118::Patient/Family education,Balance training,Joint mobilization,Therapeutic exercises,Therapeutic activity,Neuromuscular re-education,Gait training,Self Care}  PLAN FOR NEXT SESSION: ***  Grayce JINNY Sheldon, PT 12/11/2023, 7:41 PM

## 2023-12-12 ENCOUNTER — Ambulatory Visit

## 2023-12-17 ENCOUNTER — Ambulatory Visit: Admitting: Rehabilitation

## 2023-12-17 NOTE — Therapy (Incomplete)
 OUTPATIENT PHYSICAL THERAPY  UPPER EXTREMITY ONCOLOGY EVALUATION  Patient Name: Ashley Lindsey MRN: [id number] DOB:[birth year], 63 y.o., female Today's Date: 12/17/2023  END OF SESSION:   Past Medical History:  Diagnosis Date   Abdominal cramps    Anxiety    Arthritis    breast cancer    s/p radiation- last dose 6/12//has been off Tamoxifen  8/12   Chronic headaches    Costochondritis    Dyspnea 09/21/2014   Fatigue    Fibromyalgia 03/06/2013   GERD (gastroesophageal reflux disease)    History of COVID-19    Hypertension    Lymphedema    RIGHT ARM-////  STATES USE LEFT ARM FOR BP'S   Migraines    Muscle spasms of head and/or neck    following to the breast   Passed out    4th of july   Personal history of radiation therapy 2010   lt breast    S/P radiation therapy 08/19/07 - 10/10/07   Left Breast/5040 cGy/28 fractions with Boost for a toatl dose of 6300 dGy   S/P radiation therapy 08/14/10 -10/03/10   right breast   Sleep apnea    STOP BANG SCORE 5   Past Surgical History:  Procedure Laterality Date   ABDOMINAL HYSTERECTOMY     with left salpingooophorectomy   BIOPSY  04/30/2022   Procedure: BIOPSY;  Surgeon: Legrand Victory LITTIE DOUGLAS, MD;  Location: [facility];  Service: Gastroenterology;;   BREAST LUMPECTOMY  2009/2012   LEFT/RIGHT with lymph node dissection   COLONOSCOPY WITH PROPOFOL  N/A 04/30/2022   Procedure: COLONOSCOPY WITH PROPOFOL ;  Surgeon: Legrand Victory LITTIE DOUGLAS, MD;  Location: [facility];  Service: Gastroenterology;  Laterality: N/A;   ESOPHAGOGASTRODUODENOSCOPY (EGD) WITH PROPOFOL  N/A 04/30/2022   Procedure: ESOPHAGOGASTRODUODENOSCOPY (EGD) WITH PROPOFOL ;  Surgeon: Legrand Victory LITTIE DOUGLAS, MD;  Location: [facility];  Service: Gastroenterology;  Laterality: N/A;   HOT HEMOSTASIS N/A 04/30/2022   Procedure: HOT HEMOSTASIS (ARGON PLASMA COAGULATION/BICAP);  Surgeon: Legrand Victory LITTIE DOUGLAS, MD;  Location: [facility];  Service: Gastroenterology;  Laterality: N/A;   KNEE  ARTHROSCOPY     right   OOPHORECTOMY  2013   Patient Active Problem List   Diagnosis Date Noted   Morbid obesity with BMI of 45.0-49.9, adult (HCC) 11/20/2023   Psoriasis 11/20/2023   Noncompliance with CPAP treatment 11/05/2023   Rash 07/22/2023   Pelvic pain 07/22/2023   Mixed hyperlipidemia 03/20/2023   Encounter for annual health examination 03/14/2023   COVID-19 vaccination declined 03/14/2023   Class 3 severe obesity due to excess calories with serious comorbidity and body mass index (BMI) of 50.0 to 59.9 in adult 03/14/2023   Need for influenza vaccination 03/14/2023   Acute cough 02/20/2023   Right sided abdominal pain 02/19/2023   Hematuria 02/19/2023   Urinary tract infection 12/31/2022   Cervicogenic headache 12/31/2022   Urinary frequency 11/28/2022   Urinary tract infection without hematuria 11/28/2022   Elevated cholesterol 11/20/2022   Vitamin D  deficiency 11/20/2022   Tibialis posterior tendinitis 10/08/2022   OSA (obstructive sleep apnea) 09/04/2022   Claustrophobia 09/04/2022   Suboptimal nutrition (HCC) 07/05/2022   Cardiomyopathy, unspecified type (HCC) 07/05/2022   Essential hypertension 07/05/2022   Type 2 diabetes mellitus with diabetic dermatitis, without long-term current use of insulin (HCC) 07/05/2022   Chronic gastric ulcer without hemorrhage and without perforation 04/30/2022   Gastric AVM 04/30/2022   Iron deficiency anemia 03/06/2022   Unilateral primary osteoarthritis, left knee 10/02/2021   Pulmonary nodules 07/12/2021  Chronic cough 07/12/2021   Avulsion fracture of middle phalanx of finger 04/18/2021   Tibial plateau fracture, left 04/18/2021   Asymmetrical sensorineural hearing loss 02/02/2021   Bilateral primary osteoarthritis of knee 02/09/2020   Chronic intermittent hypoxia with obstructive sleep apnea 08/24/2019   Chronic intractable headache 07/14/2019   Morning headache 07/14/2019   Loud snoring 07/14/2019   Super obesity  07/14/2019   Sleeps in sitting position due to orthopnea 07/14/2019   Chest pain due to GERD 07/14/2019   Nocturia more than twice per night 07/14/2019   Unilateral primary osteoarthritis, right knee 05/26/2019   Chronic migraine without aura without status migrainosus, not intractable 05/13/2019   Class 3 severe obesity due to excess calories without serious comorbidity with body mass index (BMI) of 40.0 to 44.9 in adult 08/05/2018   Depression 08/05/2018   Chronic nonintractable headache 08/05/2018   Chronic pain of right ankle 04/24/2018   Breast cancer of lower-outer quadrant of right female breast (HCC) 12/02/2014   Bronchospasm 09/21/2014   Chronic pain of right knee 09/21/2014   Fibromyalgia 03/06/2013   Dense breasts 03/06/2013   Mastalgia 03/06/2013   Back pain, lumbosacral 03/06/2013   Lymphedema of arm 03/06/2013   Atypical chest pain 01/06/2013   Breast cancer of upper-inner quadrant of left female breast (HCC) 09/08/2012   Family history of malignant neoplasm of breast 09/08/2012   S/P radiation therapy    History of breast cancer in female 08/13/2011    PCP: [name], FNP  REFERRING PROVIDER: Mackey Chad, MD  REFERRING DIAG: Right UE lymphedema  THERAPY DIAG:  No diagnosis found.  ONSET DATE: 2013  Rationale for Evaluation and Treatment: Rehabilitation  SUBJECTIVE:                                                                                                                                                                                           SUBJECTIVE STATEMENT: ***  PERTINENT HISTORY:  Left breast cancer diagnosed with lumpectomy with 9 lymph nodes removed in 2013; right breast cancer about six months later with lumpectomy and 13 lymph nodes removed in 2014. Diagnosed with fibromyalgia. Had a chemoradiation for both breasts at [facility]. Seen for Left UE swelling in 2019, 2021 but limited visits. Had old sleeves  PAIN:  Are you having pain?  {yes/no:20286} NPRS scale: ***/10 Pain location: *** Pain orientation: {Pain Orientation:25161}  PAIN TYPE: {type:313116} Pain description: {PAIN DESCRIPTION:21022940}  Aggravating factors: *** Relieving factors: ***  PRECAUTIONS: Lymphedema  RED FLAGS: None   WEIGHT BEARING RESTRICTIONS: No  FALLS:  Has patient fallen in last 6 months? {fallsyesno:27318}  LIVING ENVIRONMENT: Lives with: {  [facility] lives with:25569::lives with their family} Lives in: {Lives in:[address]70} Stairs: {yes/no:20286}; {Stairs:24000} Has following equipment at home: {Assistive devices:23999}  OCCUPATION: ***  LEISURE: ***  HAND DOMINANCE: right   PRIOR LEVEL OF FUNCTION: Independent  PATIENT GOALS: ***   OBJECTIVE: Note: Objective measures were completed at Evaluation unless otherwise noted.  COGNITION: Overall cognitive status: Within functional limits for tasks assessed   PALPATION: ***  OBSERVATIONS / OTHER ASSESSMENTS: ***  SENSATION: Light touch: {intact/deficits:24005}   POSTURE: forward head, rounded shoulders  UPPER EXTREMITY AROM/PROM:  A/PROM RIGHT   eval   Shoulder extension   Shoulder flexion   Shoulder abduction   Shoulder internal rotation   Shoulder external rotation     (Blank rows = not tested)  A/PROM LEFT   eval  Shoulder extension   Shoulder flexion   Shoulder abduction   Shoulder internal rotation   Shoulder external rotation     (Blank rows = not tested)  CERVICAL AROM: All within fxl limits:      UPPER EXTREMITY STRENGTH:   LYMPHEDEMA ASSESSMENTS:   SEE history   LYMPHEDEMA ASSESSMENTS:   LANDMARK RIGHT  eval  At axilla    15 cm proximal to olecranon process   10 cm proximal to olecranon process   Olecranon process   15 cm proximal to ulnar styloid process   10 cm proximal to ulnar styloid process   Just proximal to ulnar styloid process   Across hand at thumb web space   At base of 2nd digit   (Blank rows = not  tested)  LANDMARK LEFT  eval  At axilla    15 cm proximal to olecranon process   10 cm proximal to olecranon process   Olecranon process   15 cm proximal to ulnar styloid process   10 cm proximal to ulnar styloid process   Just proximal to ulnar styloid process   Across hand at thumb web space   At base of 2nd digit   (Blank rows = not tested)   FUNCTIONAL TESTS:  {Functional tests:24029}  GAIT: Distance walked: *** Assistive device utilized: {Assistive devices:23999} Level of assistance: {Levels of assistance:24026} Comments: ***  L-DEX LYMPHEDEMA SCREENING: The patient was assessed using the L-Dex machine today to produce a lymphedema index baseline score. The patient will be reassessed on a regular basis (typically every 3 months) to obtain new L-Dex scores. If the score is > 6.5 points away from his/her baseline score indicating onset of subclinical lymphedema, it will be recommended to wear a compression garment for 4 weeks, 12 hours per day and then be reassessed. If the score continues to be > 6.5 points from baseline at reassessment, we will initiate lymphedema treatment. Assessing in this manner has a 95% rate of preventing clinically significant lymphedema.  QUICK DASH SURVEY: ***  TREATMENT DATE: ***    PATIENT EDUCATION:  Education details: *** Person educated: {Person educated:25204} Education method: {Education Method:25205} Education comprehension: {Education Comprehension:25206}  HOME EXERCISE PROGRAM: ***  ASSESSMENT:  CLINICAL IMPRESSION: Patient is a 63 y.o. female who was seen today for physical therapy evaluation and treatment for ***.    OBJECTIVE IMPAIRMENTS: {opptimpairments:25111}.   ACTIVITY LIMITATIONS: {activitylimitations:27494}  PARTICIPATION LIMITATIONS: {participationrestrictions:25113}  PERSONAL FACTORS:  {Personal factors:25162} are also affecting patient's functional outcome.   REHAB POTENTIAL: {rehabpotential:25112}  CLINICAL DECISION MAKING: {clinical decision making:25114}  EVALUATION COMPLEXITY: {Evaluation complexity:25115}  GOALS: Goals reviewed with patient? {yes/no:20286}  SHORT TERM GOALS: Target date: ***  *** Baseline: Goal status: INITIAL  2.  *** Baseline:  Goal status: INITIAL  3.  *** Baseline:  Goal status: INITIAL  4.  *** Baseline:  Goal status: INITIAL  5.  *** Baseline:  Goal status: INITIAL  6.  *** Baseline:  Goal status: INITIAL  LONG TERM GOALS: Target date: ***  *** Baseline:  Goal status: INITIAL  2.  *** Baseline:  Goal status: INITIAL  3.  *** Baseline:  Goal status: INITIAL  4.  *** Baseline:  Goal status: INITIAL  5.  *** Baseline:  Goal status: INITIAL  6.  *** Baseline:  Goal status: INITIAL  PLAN:  PT FREQUENCY: {rehab frequency:25116}  PT DURATION: {rehab duration:25117}  PLANNED INTERVENTIONS: {rehab planned interventions:25118::Patient/Family education,Balance training,Joint mobilization,Therapeutic exercises,Therapeutic activity,Neuromuscular re-education,Gait training,Self Care}  PLAN FOR NEXT SESSION: Ashley Lindsey, PT 12/17/2023, 4:19 PM

## 2023-12-18 ENCOUNTER — Ambulatory Visit: Admitting: Rehabilitation

## 2023-12-20 ENCOUNTER — Other Ambulatory Visit: Payer: Self-pay | Admitting: Hematology and Oncology

## 2023-12-20 ENCOUNTER — Other Ambulatory Visit: Payer: Self-pay | Admitting: Nurse Practitioner

## 2023-12-20 ENCOUNTER — Other Ambulatory Visit: Payer: Self-pay | Admitting: Physical Medicine and Rehabilitation

## 2023-12-20 ENCOUNTER — Other Ambulatory Visit: Payer: Self-pay | Admitting: *Deleted

## 2023-12-20 DIAGNOSIS — E11620 Type 2 diabetes mellitus with diabetic dermatitis: Secondary | ICD-10-CM

## 2023-12-20 MED ORDER — VENLAFAXINE HCL ER 75 MG PO CP24
75.0000 mg | ORAL_CAPSULE | Freq: Two times a day (BID) | ORAL | 1 refills | Status: AC
Start: 2023-12-20 — End: ?

## 2023-12-25 NOTE — Therapy (Incomplete)
 OUTPATIENT PHYSICAL THERAPY  UPPER EXTREMITY ONCOLOGY EVALUATION  Patient Name: Ashley Lindsey MRN: [id number] DOB:1961/02/17, 63 y.o., female Today's Date: 12/25/2023  END OF SESSION:   Past Medical History:  Diagnosis Date   Abdominal cramps    Anxiety    Arthritis    breast cancer    s/p radiation- last dose 6/12//has been off Tamoxifen  8/12   Chronic headaches    Costochondritis    Dyspnea 09/21/2014   Fatigue    Fibromyalgia 03/06/2013   GERD (gastroesophageal reflux disease)    History of COVID-19    Hypertension    Lymphedema    RIGHT ARM-////  STATES USE LEFT ARM FOR BP'S   Migraines    Muscle spasms of head and/or neck    following to the breast   Passed out    4th of july   Personal history of radiation therapy 2010   lt breast    S/P radiation therapy 08/19/07 - 10/10/07   Left Breast/5040 cGy/28 fractions with Boost for a toatl dose of 6300 dGy   S/P radiation therapy 08/14/10 -10/03/10   right breast   Sleep apnea    STOP BANG SCORE 5   Past Surgical History:  Procedure Laterality Date   ABDOMINAL HYSTERECTOMY     with left salpingooophorectomy   BIOPSY  04/30/2022   Procedure: BIOPSY;  Surgeon: Legrand Victory LITTIE DOUGLAS, MD;  Location: [facility];  Service: Gastroenterology;;   BREAST LUMPECTOMY  2009/2012   LEFT/RIGHT with lymph node dissection   COLONOSCOPY WITH PROPOFOL  N/A 04/30/2022   Procedure: COLONOSCOPY WITH PROPOFOL ;  Surgeon: Legrand Victory LITTIE DOUGLAS, MD;  Location: [facility];  Service: Gastroenterology;  Laterality: N/A;   ESOPHAGOGASTRODUODENOSCOPY (EGD) WITH PROPOFOL  N/A 04/30/2022   Procedure: ESOPHAGOGASTRODUODENOSCOPY (EGD) WITH PROPOFOL ;  Surgeon: Legrand Victory LITTIE DOUGLAS, MD;  Location: [facility];  Service: Gastroenterology;  Laterality: N/A;   HOT HEMOSTASIS N/A 04/30/2022   Procedure: HOT HEMOSTASIS (ARGON PLASMA COAGULATION/BICAP);  Surgeon: Legrand Victory LITTIE DOUGLAS, MD;  Location: [facility];  Service: Gastroenterology;  Laterality: N/A;   KNEE  ARTHROSCOPY     right   OOPHORECTOMY  2013   Patient Active Problem List   Diagnosis Date Noted   Morbid obesity with BMI of 45.0-49.9, adult (HCC) 11/20/2023   Psoriasis 11/20/2023   Noncompliance with CPAP treatment 11/05/2023   Rash 07/22/2023   Pelvic pain 07/22/2023   Mixed hyperlipidemia 03/20/2023   Encounter for annual health examination 03/14/2023   COVID-19 vaccination declined 03/14/2023   Class 3 severe obesity due to excess calories with serious comorbidity and body mass index (BMI) of 50.0 to 59.9 in adult 03/14/2023   Need for influenza vaccination 03/14/2023   Acute cough 02/20/2023   Right sided abdominal pain 02/19/2023   Hematuria 02/19/2023   Urinary tract infection 12/31/2022   Cervicogenic headache 12/31/2022   Urinary frequency 11/28/2022   Urinary tract infection without hematuria 11/28/2022   Elevated cholesterol 11/20/2022   Vitamin D  deficiency 11/20/2022   Tibialis posterior tendinitis 10/08/2022   OSA (obstructive sleep apnea) 09/04/2022   Claustrophobia 09/04/2022   Suboptimal nutrition (HCC) 07/05/2022   Cardiomyopathy, unspecified type (HCC) 07/05/2022   Essential hypertension 07/05/2022   Type 2 diabetes mellitus with diabetic dermatitis, without long-term current use of insulin (HCC) 07/05/2022   Chronic gastric ulcer without hemorrhage and without perforation 04/30/2022   Gastric AVM 04/30/2022   Iron deficiency anemia 03/06/2022   Unilateral primary osteoarthritis, left knee 10/02/2021   Pulmonary nodules 07/12/2021  Chronic cough 07/12/2021   Avulsion fracture of middle phalanx of finger 04/18/2021   Tibial plateau fracture, left 04/18/2021   Asymmetrical sensorineural hearing loss 02/02/2021   Bilateral primary osteoarthritis of knee 02/09/2020   Chronic intermittent hypoxia with obstructive sleep apnea 08/24/2019   Chronic intractable headache 07/14/2019   Morning headache 07/14/2019   Loud snoring 07/14/2019   Super obesity  07/14/2019   Sleeps in sitting position due to orthopnea 07/14/2019   Chest pain due to GERD 07/14/2019   Nocturia more than twice per night 07/14/2019   Unilateral primary osteoarthritis, right knee 05/26/2019   Chronic migraine without aura without status migrainosus, not intractable 05/13/2019   Class 3 severe obesity due to excess calories without serious comorbidity with body mass index (BMI) of 40.0 to 44.9 in adult 08/05/2018   Depression 08/05/2018   Chronic nonintractable headache 08/05/2018   Chronic pain of right ankle 04/24/2018   Breast cancer of lower-outer quadrant of right female breast (HCC) 12/02/2014   Bronchospasm 09/21/2014   Chronic pain of right knee 09/21/2014   Fibromyalgia 03/06/2013   Dense breasts 03/06/2013   Mastalgia 03/06/2013   Back pain, lumbosacral 03/06/2013   Lymphedema of arm 03/06/2013   Atypical chest pain 01/06/2013   Breast cancer of upper-inner quadrant of left female breast (HCC) 09/08/2012   Family history of malignant neoplasm of breast 09/08/2012   S/P radiation therapy    History of breast cancer in female 08/13/2011    PCP: [name], FNP  REFERRING PROVIDER: Mackey Chad, MD  REFERRING DIAG: Right UE lymphedema  THERAPY DIAG:  No diagnosis found.  ONSET DATE: 2013  Rationale for Evaluation and Treatment: Rehabilitation  SUBJECTIVE:                                                                                                                                                                                           SUBJECTIVE STATEMENT: ***  PERTINENT HISTORY:  Left breast cancer diagnosed with lumpectomy with 9 lymph nodes removed in 2013; right breast cancer about six months later with lumpectomy and 13 lymph nodes removed in 2014. Diagnosed with fibromyalgia. Had a chemoradiation for both breasts at [facility] ([place] Seen for Left UE swelling in 2019, 2021 but limited visits. Had old sleeves  PAIN:  Are you having pain?  {yes/no:20286} NPRS scale: ***/10 Pain location: *** Pain orientation: {Pain Orientation:25161}  PAIN TYPE: {type:313116} Pain description: {PAIN DESCRIPTION:21022940}  Aggravating factors: *** Relieving factors: ***  PRECAUTIONS: Lymphedema  RED FLAGS: None   WEIGHT BEARING RESTRICTIONS: No  FALLS:  Has patient fallen in last 6 months? {fallsyesno:27318}  LIVING ENVIRONMENT: Lives with: {  [facility] lives with:25569::lives with their family} Lives in: {Lives in:25570} Stairs: {yes/no:20286}; {Stairs:24000} Has following equipment at home: {Assistive devices:23999}  OCCUPATION: ***  LEISURE: ***  HAND DOMINANCE: right   PRIOR LEVEL OF FUNCTION: Independent  PATIENT GOALS: ***   OBJECTIVE: Note: Objective measures were completed at Evaluation unless otherwise noted.  COGNITION: Overall cognitive status: Within functional limits for tasks assessed   PALPATION: ***  OBSERVATIONS / OTHER ASSESSMENTS: ***  SENSATION: Light touch: {intact/deficits:24005}   POSTURE: forward head, rounded shoulders  UPPER EXTREMITY AROM/PROM:  A/PROM RIGHT   eval   Shoulder extension   Shoulder flexion   Shoulder abduction   Shoulder internal rotation   Shoulder external rotation     (Blank rows = not tested)  A/PROM LEFT   eval  Shoulder extension   Shoulder flexion   Shoulder abduction   Shoulder internal rotation   Shoulder external rotation     (Blank rows = not tested)  CERVICAL AROM: All within fxl limits:      UPPER EXTREMITY STRENGTH:   LYMPHEDEMA ASSESSMENTS:   SEE history   LYMPHEDEMA ASSESSMENTS:   LANDMARK RIGHT  eval  At axilla    15 cm proximal to olecranon process   10 cm proximal to olecranon process   Olecranon process   15 cm proximal to ulnar styloid process   10 cm proximal to ulnar styloid process   Just proximal to ulnar styloid process   Across hand at thumb web space   At base of 2nd digit   (Blank rows = not  tested)  LANDMARK LEFT  eval  At axilla    15 cm proximal to olecranon process   10 cm proximal to olecranon process   Olecranon process   15 cm proximal to ulnar styloid process   10 cm proximal to ulnar styloid process   Just proximal to ulnar styloid process   Across hand at thumb web space   At base of 2nd digit   (Blank rows = not tested)   FUNCTIONAL TESTS:  {Functional tests:24029}  GAIT: Distance walked: *** Assistive device utilized: {Assistive devices:23999} Level of assistance: {Levels of assistance:24026} Comments: ***  L-DEX LYMPHEDEMA SCREENING: The patient was assessed using the L-Dex machine today to produce a lymphedema index baseline score. The patient will be reassessed on a regular basis (typically every 3 months) to obtain new L-Dex scores. If the score is > 6.5 points away from his/her baseline score indicating onset of subclinical lymphedema, it will be recommended to wear a compression garment for 4 weeks, 12 hours per day and then be reassessed. If the score continues to be > 6.5 points from baseline at reassessment, we will initiate lymphedema treatment. Assessing in this manner has a 95% rate of preventing clinically significant lymphedema.  QUICK DASH SURVEY: ***  TREATMENT DATE: ***    PATIENT EDUCATION:  Education details: *** Person educated: {Person educated:25204} Education method: {Education Method:25205} Education comprehension: {Education Comprehension:25206}  HOME EXERCISE PROGRAM: ***  ASSESSMENT:  CLINICAL IMPRESSION: Patient is a 63 y.o. female who was seen today for physical therapy evaluation and treatment for ***.    OBJECTIVE IMPAIRMENTS: {opptimpairments:25111}.   ACTIVITY LIMITATIONS: {activitylimitations:27494}  PARTICIPATION LIMITATIONS: {participationrestrictions:25113}  PERSONAL FACTORS:  {Personal factors:25162} are also affecting patient's functional outcome.   REHAB POTENTIAL: {rehabpotential:25112}  CLINICAL DECISION MAKING: {clinical decision making:25114}  EVALUATION COMPLEXITY: {Evaluation complexity:25115}  GOALS: Goals reviewed with patient? {yes/no:20286}  SHORT TERM GOALS: Target date: ***  *** Baseline: Goal status: INITIAL  2.  *** Baseline:  Goal status: INITIAL  3.  *** Baseline:  Goal status: INITIAL  4.  *** Baseline:  Goal status: INITIAL  5.  *** Baseline:  Goal status: INITIAL  6.  *** Baseline:  Goal status: INITIAL  LONG TERM GOALS: Target date: ***  *** Baseline:  Goal status: INITIAL  2.  *** Baseline:  Goal status: INITIAL  3.  *** Baseline:  Goal status: INITIAL  4.  *** Baseline:  Goal status: INITIAL  5.  *** Baseline:  Goal status: INITIAL  6.  *** Baseline:  Goal status: INITIAL  PLAN:  PT FREQUENCY: {rehab frequency:25116}  PT DURATION: {rehab duration:25117}  PLANNED INTERVENTIONS: {rehab planned interventions:25118::Patient/Family education,Balance training,Joint mobilization,Therapeutic exercises,Therapeutic activity,Neuromuscular re-education,Gait training,Self Care}  PLAN FOR NEXT SESSION: ***  Grayce JINNY Sheldon, PT 12/25/2023, 7:12 AM

## 2023-12-26 ENCOUNTER — Ambulatory Visit: Payer: Self-pay | Attending: Hematology and Oncology | Admitting: Rehabilitation

## 2023-12-26 ENCOUNTER — Encounter: Payer: Self-pay | Admitting: Rehabilitation

## 2023-12-26 DIAGNOSIS — E1151 Type 2 diabetes mellitus with diabetic peripheral angiopathy without gangrene: Secondary | ICD-10-CM | POA: Diagnosis not present

## 2023-12-26 DIAGNOSIS — I700 Atherosclerosis of aorta: Secondary | ICD-10-CM | POA: Diagnosis not present

## 2023-12-26 DIAGNOSIS — L409 Psoriasis, unspecified: Secondary | ICD-10-CM | POA: Diagnosis not present

## 2023-12-26 DIAGNOSIS — M797 Fibromyalgia: Secondary | ICD-10-CM | POA: Diagnosis not present

## 2023-12-26 DIAGNOSIS — I509 Heart failure, unspecified: Secondary | ICD-10-CM | POA: Diagnosis not present

## 2023-12-26 DIAGNOSIS — Z7985 Long-term (current) use of injectable non-insulin antidiabetic drugs: Secondary | ICD-10-CM | POA: Diagnosis not present

## 2023-12-26 DIAGNOSIS — I110 Hypertensive heart disease with heart failure: Secondary | ICD-10-CM | POA: Diagnosis not present

## 2023-12-26 DIAGNOSIS — M170 Bilateral primary osteoarthritis of knee: Secondary | ICD-10-CM | POA: Diagnosis not present

## 2023-12-26 DIAGNOSIS — Z0000 Encounter for general adult medical examination without abnormal findings: Secondary | ICD-10-CM | POA: Diagnosis not present

## 2023-12-27 ENCOUNTER — Other Ambulatory Visit: Payer: Self-pay | Admitting: Physical Medicine and Rehabilitation

## 2023-12-31 ENCOUNTER — Other Ambulatory Visit: Payer: Self-pay

## 2023-12-31 DIAGNOSIS — E11620 Type 2 diabetes mellitus with diabetic dermatitis: Secondary | ICD-10-CM

## 2023-12-31 MED ORDER — OZEMPIC (2 MG/DOSE) 8 MG/3ML ~~LOC~~ SOPN: 2.0000 mg | PEN_INJECTOR | SUBCUTANEOUS | 2 refills | Status: AC

## 2024-01-01 ENCOUNTER — Telehealth: Payer: Self-pay

## 2024-01-01 ENCOUNTER — Other Ambulatory Visit: Payer: Self-pay | Admitting: Physical Medicine and Rehabilitation

## 2024-01-01 NOTE — Telephone Encounter (Signed)
(  KeyBETHA SAUCE) PA Case ID #: [id number] Rx #: [id number] Need Help? Call us  at [phone] Status sent iconSent to Plan today Drug Qutenza (4 Patch) 8% kit

## 2024-01-07 ENCOUNTER — Ambulatory Visit (INDEPENDENT_AMBULATORY_CARE_PROVIDER_SITE_OTHER): Admitting: Physician Assistant

## 2024-01-07 ENCOUNTER — Encounter: Payer: Self-pay | Admitting: Physician Assistant

## 2024-01-07 DIAGNOSIS — M170 Bilateral primary osteoarthritis of knee: Secondary | ICD-10-CM

## 2024-01-07 DIAGNOSIS — M1712 Unilateral primary osteoarthritis, left knee: Secondary | ICD-10-CM

## 2024-01-07 DIAGNOSIS — M1711 Unilateral primary osteoarthritis, right knee: Secondary | ICD-10-CM

## 2024-01-07 MED ORDER — LIDOCAINE HCL 1 % IJ SOLN
4.0000 mL | INTRAMUSCULAR | Status: AC | PRN
  Administered 2024-01-07: 4 mL

## 2024-01-07 MED ORDER — METHYLPREDNISOLONE ACETATE 40 MG/ML IJ SUSP
40.0000 mg | INTRAMUSCULAR | Status: AC | PRN
  Administered 2024-01-07: 40 mg via INTRA_ARTICULAR

## 2024-01-07 NOTE — Progress Notes (Signed)
 Office Visit Note   Patient: Ashley Lindsey           Date of Birth: 1961-04-08           MRN: [id number] Visit Date: 01/07/2024              Requested by: Georgina Speaks, FNP [address] [place],  [place] [address] PCP: Georgina Speaks, FNP      HPI: Patient is a pleasant 63 year old woman with end-stage osteoarthritis bilateral knees.  Comes in periodically for steroid injections no new injury requesting injections today  Assessment & Plan: Visit Diagnoses:  1. Unilateral primary osteoarthritis, right knee   2. Unilateral primary osteoarthritis, left knee     Plan: Patient's knees injected will do new BMI today she has significant end-stage changes on her left and she has lost 37 pounds at some point can consider knee replacement  Follow-Up Instructions: No follow-ups on file.   Ortho Exam  Patient is alert, oriented, no adenopathy, well-dressed, normal affect, normal respiratory effort. Bilateral knees no effusion no erythema compartments are soft and compressible she is neurovascular intact    Imaging: No results found. No images are attached to the encounter.  Labs: Lab Results  Component Value Date   HGBA1C 5.8 (H) 11/12/2023   HGBA1C 5.9 (H) 07/11/2023   HGBA1C 6.3 (H) 02/22/2023   ESRSEDRATE 48 (H) 08/26/2019   LABURIC 4.8 08/26/2019   REPTSTATUS 06/12/2017 FINAL 06/10/2017   CULT >=100,000 COLONIES/mL ESCHERICHIA COLI (A) 06/10/2017   LABORGA ESCHERICHIA COLI (A) 06/10/2017     Lab Results  Component Value Date   ALBUMIN 4.1 02/22/2023   ALBUMIN 4.3 03/29/2022   ALBUMIN 4.2 03/05/2022    Lab Results  Component Value Date   MG 1.9 01/16/2022   MG 2.0 02/17/2020   Lab Results  Component Value Date   VD25OH 51.5 11/20/2022   VD25OH 41.6 01/16/2022   VD25OH 32 12/26/2011    No results found for: PREALBUMIN    Latest Ref Rng & Units 02/22/2023   11:17 AM 07/05/2022   12:15 PM 04/12/2022    4:13 PM  CBC EXTENDED  WBC 3.4 -  10.8 x10E3/uL 9.2  8.9  9.6   RBC 3.77 - 5.28 x10E6/uL 4.63  4.81  4.83   Hemoglobin 11.1 - 15.9 g/dL 88.1  87.3  88.9   HCT 34.0 - 46.6 % 39.5  39.3  36.7   Platelets 150 - 450 x10E3/uL 372  351.0  293.0   NEUT# 1.4 - 7.7 K/uL  5.4  6.2   Lymph# 0.7 - 4.0 K/uL  2.7  2.6      There is no height or weight on file to calculate BMI.  Orders:  No orders of the defined types were placed in this encounter.  No orders of the defined types were placed in this encounter.    Procedures: Large Joint Inj: bilateral knee on 01/07/2024 2:22 PM Indications: pain and diagnostic evaluation Details: 25 G 1.5 in needle, anteromedial approach  Arthrogram: No  Medications (Right): 4 mL lidocaine  1 %; 40 mg methylPREDNISolone  acetate 40 MG/ML Medications (Left): 4 mL lidocaine  1 %; 40 mg methylPREDNISolone  acetate 40 MG/ML Outcome: tolerated well, no immediate complications Procedure, treatment alternatives, risks and benefits explained, specific risks discussed. Consent was given by the patient.     Clinical Data: No additional findings.  ROS:  All other systems negative, except as noted in the HPI. Review of Systems  Objective: Vital Signs:  There were no vitals taken for this visit.  Specialty Comments:  No specialty comments available.  PMFS History: Patient Active Problem List   Diagnosis Date Noted  . Morbid obesity with BMI of 45.0-49.9, adult (HCC) 11/20/2023  . Psoriasis 11/20/2023  . Noncompliance with CPAP treatment 11/05/2023  . Rash 07/22/2023  . Pelvic pain 07/22/2023  . Mixed hyperlipidemia 03/20/2023  . Encounter for annual health examination 03/14/2023  . COVID-19 vaccination declined 03/14/2023  . Class 3 severe obesity due to excess calories with serious comorbidity and body mass index (BMI) of 50.0 to 59.9 in adult 03/14/2023  . Need for influenza vaccination 03/14/2023  . Acute cough 02/20/2023  . Right sided abdominal pain 02/19/2023  . Hematuria 02/19/2023   . Urinary tract infection 12/31/2022  . Cervicogenic headache 12/31/2022  . Urinary frequency 11/28/2022  . Urinary tract infection without hematuria 11/28/2022  . Elevated cholesterol 11/20/2022  . Vitamin D  deficiency 11/20/2022  . Tibialis posterior tendinitis 10/08/2022  . OSA (obstructive sleep apnea) 09/04/2022  . Claustrophobia 09/04/2022  . Suboptimal nutrition (HCC) 07/05/2022  . Cardiomyopathy, unspecified type (HCC) 07/05/2022  . Essential hypertension 07/05/2022  . Type 2 diabetes mellitus with diabetic dermatitis, without long-term current use of insulin (HCC) 07/05/2022  . Chronic gastric ulcer without hemorrhage and without perforation 04/30/2022  . Gastric AVM 04/30/2022  . Iron deficiency anemia 03/06/2022  . Unilateral primary osteoarthritis, left knee 10/02/2021  . Pulmonary nodules 07/12/2021  . Chronic cough 07/12/2021  . Avulsion fracture of middle phalanx of finger 04/18/2021  . Tibial plateau fracture, left 04/18/2021  . Asymmetrical sensorineural hearing loss 02/02/2021  . Bilateral primary osteoarthritis of knee 02/09/2020  . Chronic intermittent hypoxia with obstructive sleep apnea 08/24/2019  . Chronic intractable headache 07/14/2019  . Morning headache 07/14/2019  . Loud snoring 07/14/2019  . Super obesity 07/14/2019  . Sleeps in sitting position due to orthopnea 07/14/2019  . Chest pain due to GERD 07/14/2019  . Nocturia more than twice per night 07/14/2019  . Unilateral primary osteoarthritis, right knee 05/26/2019  . Chronic migraine without aura without status migrainosus, not intractable 05/13/2019  . Class 3 severe obesity due to excess calories without serious comorbidity with body mass index (BMI) of 40.0 to 44.9 in adult 08/05/2018  . Depression 08/05/2018  . Chronic nonintractable headache 08/05/2018  . Chronic pain of right ankle 04/24/2018  . Breast cancer of lower-outer quadrant of right female breast (HCC) 12/02/2014  . Bronchospasm  09/21/2014  . Chronic pain of right knee 09/21/2014  . Fibromyalgia 03/06/2013  . Dense breasts 03/06/2013  . Mastalgia 03/06/2013  . Back pain, lumbosacral 03/06/2013  . Lymphedema of arm 03/06/2013  . Atypical chest pain 01/06/2013  . Breast cancer of upper-inner quadrant of left female breast (HCC) 09/08/2012  . Family history of malignant neoplasm of breast 09/08/2012  . S/P radiation therapy   . History of breast cancer in female 08/13/2011   Past Medical History:  Diagnosis Date  . Abdominal cramps   . Anxiety   . Arthritis   . breast cancer    s/p radiation- last dose 6/12//has been off Tamoxifen  8/12  . Chronic headaches   . Costochondritis   . Dyspnea 09/21/2014  . Fatigue   . Fibromyalgia 03/06/2013  . GERD (gastroesophageal reflux disease)   . History of COVID-19   . Hypertension   . Lymphedema    RIGHT ARM-////  STATES USE LEFT ARM FOR BP'S  . Migraines   . Muscle spasms  of head and/or neck    following to the breast  . Passed out    4th of july  . Personal history of radiation therapy 2010   lt breast   . S/P radiation therapy 08/19/07 - 10/10/07   Left Breast/5040 cGy/28 fractions with Boost for a toatl dose of 6300 dGy  . S/P radiation therapy 08/14/10 -10/03/10   right breast  . Sleep apnea    STOP BANG SCORE 5    Family History  Problem Relation Age of Onset  . Breast cancer Mother 60  . Heart disease Father   . Diabetes Sister   . Prostate cancer Maternal Grandfather 65  . Cancer Maternal Aunt 63       d. from female cancer possibly cervical  . Breast cancer Maternal Aunt 60  . Prostate cancer Maternal Uncle 78  . Prostate cancer Maternal Uncle 80  . Prostate cancer Paternal Uncle   . Prostate cancer Paternal Uncle   . Breast cancer Cousin 30       mat 1st cousin    Past Surgical History:  Procedure Laterality Date  . ABDOMINAL HYSTERECTOMY     with left salpingooophorectomy  . BIOPSY  04/30/2022   Procedure: BIOPSY;  Surgeon: Legrand Victory LITTIE DOUGLAS, MD;  Location: [facility];  Service: Gastroenterology;;  . BREAST LUMPECTOMY  2009/2012   LEFT/RIGHT with lymph node dissection  . COLONOSCOPY WITH PROPOFOL  N/A 04/30/2022   Procedure: COLONOSCOPY WITH PROPOFOL ;  Surgeon: Legrand Victory LITTIE DOUGLAS, MD;  Location: [facility];  Service: Gastroenterology;  Laterality: N/A;  . ESOPHAGOGASTRODUODENOSCOPY (EGD) WITH PROPOFOL  N/A 04/30/2022   Procedure: ESOPHAGOGASTRODUODENOSCOPY (EGD) WITH PROPOFOL ;  Surgeon: Legrand Victory LITTIE DOUGLAS, MD;  Location: [facility];  Service: Gastroenterology;  Laterality: N/A;  . HOT HEMOSTASIS N/A 04/30/2022   Procedure: HOT HEMOSTASIS (ARGON PLASMA COAGULATION/BICAP);  Surgeon: Legrand Victory LITTIE DOUGLAS, MD;  Location: [facility];  Service: Gastroenterology;  Laterality: N/A;  . KNEE ARTHROSCOPY     right  . OOPHORECTOMY  2013   Social History   Occupational History  . Occupation: disability  Tobacco Use  . Smoking status: Former    Current packs/day: 0.25    Average packs/day: 0.3 packs/day for 1 year (0.3 ttl pk-yrs)    Types: Cigarettes  . Smokeless tobacco: Never  Vaping Use  . Vaping status: Never Used  Substance and Sexual Activity  . Alcohol use: Not Currently  . Drug use: No  . Sexual activity: Not Currently    Birth control/protection: Surgical

## 2024-01-12 ENCOUNTER — Other Ambulatory Visit: Payer: Self-pay | Admitting: Registered Nurse

## 2024-01-12 DIAGNOSIS — M797 Fibromyalgia: Secondary | ICD-10-CM

## 2024-01-14 ENCOUNTER — Other Ambulatory Visit: Payer: Self-pay | Admitting: Nurse Practitioner

## 2024-01-16 ENCOUNTER — Encounter: Admitting: Physical Medicine and Rehabilitation

## 2024-01-19 ENCOUNTER — Encounter: Payer: Self-pay | Admitting: Nurse Practitioner

## 2024-01-19 ENCOUNTER — Other Ambulatory Visit: Payer: Self-pay | Admitting: Physical Medicine and Rehabilitation

## 2024-01-25 ENCOUNTER — Other Ambulatory Visit: Payer: Self-pay | Admitting: Nurse Practitioner

## 2024-01-25 DIAGNOSIS — R110 Nausea: Secondary | ICD-10-CM

## 2024-01-28 ENCOUNTER — Encounter: Payer: Self-pay | Admitting: Nurse Practitioner

## 2024-02-05 DIAGNOSIS — G4733 Obstructive sleep apnea (adult) (pediatric): Secondary | ICD-10-CM | POA: Diagnosis not present

## 2024-02-11 ENCOUNTER — Encounter: Attending: Physical Medicine and Rehabilitation | Admitting: Physical Medicine and Rehabilitation

## 2024-02-24 ENCOUNTER — Encounter: Payer: Self-pay | Admitting: Radiology

## 2024-02-26 ENCOUNTER — Ambulatory Visit: Payer: 59

## 2024-03-03 ENCOUNTER — Encounter: Admitting: Physical Medicine and Rehabilitation

## 2024-03-04 ENCOUNTER — Ambulatory Visit

## 2024-03-04 ENCOUNTER — Ambulatory Visit: Payer: 59

## 2024-03-04 ENCOUNTER — Ambulatory Visit: Payer: Self-pay | Admitting: Nurse Practitioner

## 2024-03-05 ENCOUNTER — Encounter: Payer: Self-pay | Admitting: Nurse Practitioner

## 2024-03-05 NOTE — Progress Notes (Signed)
 Not seen

## 2024-03-12 ENCOUNTER — Other Ambulatory Visit: Payer: Self-pay | Admitting: Nurse Practitioner

## 2024-03-16 ENCOUNTER — Other Ambulatory Visit: Payer: Self-pay | Admitting: Nurse Practitioner

## 2024-03-25 ENCOUNTER — Other Ambulatory Visit: Payer: Self-pay | Admitting: Nurse Practitioner

## 2024-03-25 ENCOUNTER — Other Ambulatory Visit: Payer: Self-pay | Admitting: Physician Assistant

## 2024-03-25 DIAGNOSIS — N390 Urinary tract infection, site not specified: Secondary | ICD-10-CM

## 2024-03-26 ENCOUNTER — Encounter: Payer: Self-pay | Admitting: Nurse Practitioner

## 2024-03-26 ENCOUNTER — Ambulatory Visit: Payer: Self-pay | Admitting: Nurse Practitioner

## 2024-03-26 ENCOUNTER — Other Ambulatory Visit: Payer: Self-pay | Admitting: Nurse Practitioner

## 2024-03-26 VITALS — BP 120/70 | HR 86 | Temp 97.7°F | Ht 62.0 in | Wt 273.0 lb

## 2024-03-26 DIAGNOSIS — N6312 Unspecified lump in the right breast, upper inner quadrant: Secondary | ICD-10-CM

## 2024-03-26 DIAGNOSIS — L409 Psoriasis, unspecified: Secondary | ICD-10-CM

## 2024-03-26 DIAGNOSIS — G4733 Obstructive sleep apnea (adult) (pediatric): Secondary | ICD-10-CM

## 2024-03-26 DIAGNOSIS — Z0000 Encounter for general adult medical examination without abnormal findings: Secondary | ICD-10-CM

## 2024-03-26 DIAGNOSIS — E2839 Other primary ovarian failure: Secondary | ICD-10-CM

## 2024-03-26 DIAGNOSIS — E11620 Type 2 diabetes mellitus with diabetic dermatitis: Secondary | ICD-10-CM

## 2024-03-26 DIAGNOSIS — Z79899 Other long term (current) drug therapy: Secondary | ICD-10-CM

## 2024-03-26 DIAGNOSIS — I10 Essential (primary) hypertension: Secondary | ICD-10-CM

## 2024-03-26 DIAGNOSIS — N390 Urinary tract infection, site not specified: Secondary | ICD-10-CM

## 2024-03-26 DIAGNOSIS — Z23 Encounter for immunization: Secondary | ICD-10-CM

## 2024-03-26 DIAGNOSIS — E782 Mixed hyperlipidemia: Secondary | ICD-10-CM

## 2024-03-26 LAB — POCT URINALYSIS DIP (CLINITEK)
Bilirubin, UA: NEGATIVE
Glucose, UA: NEGATIVE mg/dL
Ketones, POC UA: NEGATIVE mg/dL
Nitrite, UA: POSITIVE — AB
POC PROTEIN,UA: NEGATIVE
Spec Grav, UA: 1.02 (ref 1.010–1.025)
Urobilinogen, UA: 0.2 U/dL
pH, UA: 7 (ref 5.0–8.0)

## 2024-03-26 NOTE — Patient Instructions (Signed)
 Health Maintenance  Topic Date Due   Eye exam for diabetics  Never done   Complete foot exam   07/05/2023   Osteoporosis screening with Bone Density Scan  02/10/2024   Medicare Annual Wellness Visit  02/13/2024   Yearly kidney health urinalysis for diabetes  03/13/2024   Breast Cancer Screening  04/04/2024   COVID-19 Vaccine (4 - 2025-26 season) 04/11/2024*   Pneumococcal Vaccine for age over 9 (2 of 2 - PCV) 03/26/2025*   Hemoglobin A1C  05/14/2024   Yearly kidney function blood test for diabetes  11/11/2024   Colon Cancer Screening  04/30/2032   DTaP/Tdap/Td vaccine (3 - Td or Tdap) 11/19/2032   Flu Shot  Completed   Hepatitis C Screening  Completed   HIV Screening  Completed   Zoster (Shingles) Vaccine  Completed   Hepatitis B Vaccine  Aged Out   Meningitis B Vaccine  Aged Out  *Topic was postponed. The date shown is not the original due date.

## 2024-03-26 NOTE — Progress Notes (Unsigned)
 Ashley Lindsey, CMA,acting as a neurosurgeon for Ashley Ada, FNP.,have documented all relevant documentation on the behalf of Ashley Ada, FNP,as directed by  Ashley Ada, FNP while in the presence of Ashley Ada, FNP.  Subjective:    Patient ID: Ashley Lindsey , female    DOB: May 12, 1960 , 63 y.o.   MRN: [id number]  Chief Complaint  Patient presents with   Annual Exam    Patient presents today for HM, Patient reports compliance with medication. Patient denies any chest pain, SOB, or headaches. Patient has no concerns today.     She was diagnosed with psoriasis by the dermatologist. She has been going to the pool. Dr. JONELLE advised her Fibromyalgia is getting more aggressive and needs a PA. She declined a pain patch. She continues to have gabapentin  and causing her grogginess and slow energy. She is eating a smoothie in the morning and a light dinner. 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     Hypertension This is a chronic problem. The current episode started more than 1 year ago. The problem has been gradually improving since onset. The problem is controlled. Associated symptoms include headaches (improved). Pertinent negatives include no anxiety, chest pain or palpitations. Risk factors for coronary artery disease include sedentary lifestyle and obesity. There are no compliance problems.  There is no history of kidney disease. Identifiable causes of hypertension include sleep apnea. There is no history of chronic renal disease.  Diabetes She presents for her follow-up diabetic visit. She has type 2 diabetes mellitus. Hypoglycemia symptoms include headaches (improved). Pertinent negatives for diabetes include no chest pain. There are no hypoglycemic complications. Diabetic complications include heart disease. Risk factors for coronary artery disease include diabetes mellitus, hypertension, obesity and sedentary lifestyle. Current diabetic treatments: GLP1.  She is following a generally unhealthy diet. When asked about meal planning, she reported none. She participates in exercise three times a week. Eye exam is current (need to get records from eye doctor).    Discussed the use of AI scribe software for clinical note transcription with the patient, who gave verbal consent to proceed.  History of Present Illness Ashley Lindsey is a 63 year old female who presents for an annual physical exam.  She experiences hearing issues, particularly in her right ear, describing a sensation of being 'in a tunnel' when she removes her hearing aids. This is a new development, occurring for about four hours at a time.  She has headaches that are somewhat controlled and gastrointestinal issues, including variable appetite and mild constipation. She has started taking cod liver oil pills, which she used in the past, in addition to omega fish oil, and reports that this has helped with the constipation. Occasional nausea is noted, which she attributes to her current medication regimen, including Ozempic .  She has not been exercising since the weather turned cold, avoiding the pool, which she previously enjoyed. She finds it difficult to walk due to knee pain, which she describes as needing a 'shot.' She also reports a new knot above her ankle bone that aches like a 'toothache' and sometimes enlarges.  She has a history of arthritis throughout her body, confirmed by a previous MRI, and experiences neuropathy with sharp shooting pain, particularly disliking wearing socks due to discomfort. She also has psoriasis, which she manages with a cream applied every night and after showers. The condition is somewhat under control, but she still experiences significant itching, especially  from the knees down and on her feet.  She is currently taking Topamax  25 mg at night and has been using a lidocaine  patch on her lower back, which she finds somewhat effective for her back pain. She  also mentions a new medication, 'S U Z E T R I G I N E,' prescribed by her rheumatologist, which she has not yet started due to insurance issues.  No shortness of breath, chest pain, or urinary issues.  Past Medical History:  Diagnosis Date   Abdominal cramps    Anxiety    Arthritis    breast cancer    s/p radiation- last dose 6/12//has been off Tamoxifen  8/12   Chronic headaches    Costochondritis    Dyspnea 09/21/2014   Fatigue    Fibromyalgia 03/06/2013   GERD (gastroesophageal reflux disease)    History of COVID-19    Hypertension    Lymphedema    RIGHT ARM-////  STATES USE LEFT ARM FOR BP'S   Migraines    Muscle spasms of head and/or neck    following to the breast   Passed out    4th of july   Personal history of radiation therapy 2010   lt breast    S/P radiation therapy 08/19/07 - 10/10/07   Left Breast/5040 cGy/28 fractions with Boost for a toatl dose of 6300 dGy   S/P radiation therapy 08/14/10 -10/03/10   right breast   Sleep apnea    STOP BANG SCORE 5     Family History  Problem Relation Age of Onset   Breast cancer Mother 87   Heart disease Father    Diabetes Sister    Prostate cancer Maternal Grandfather 85   Cancer Maternal Aunt 63       d. from female cancer possibly cervical   Breast cancer Maternal Aunt 60   Prostate cancer Maternal Uncle 78   Prostate cancer Maternal Uncle 80   Prostate cancer Paternal Uncle    Prostate cancer Paternal Uncle    Breast cancer Cousin 30       mat 1st cousin     Current Outpatient Medications:    acetaminophen  (TYLENOL ) 650 MG CR tablet, Take 650 mg by mouth every 8 (eight) hours as needed for pain., Disp: , Rfl:    albuterol  (VENTOLIN  HFA) 108 (90 Base) MCG/ACT inhaler, Inhale 2 puffs into the lungs every 4 (four) hours as needed for wheezing or shortness of breath., Disp: 1 each, Rfl: 0   Bisacodyl (DULCOLAX PO), Take 1 capsule by mouth daily as needed (constipation). , Disp: , Rfl:    cholecalciferol  5000  units TABS, Take 1 tablet (5,000 Units total) by mouth 2 (two) times daily. (Patient taking differently: Take 4,000 Units by mouth daily.), Disp: , Rfl:    cyclobenzaprine  (FLEXERIL ) 10 MG tablet, TAKE 1 TABLET BY MOUTH EVERY DAY AS NEEDED, Disp: 30 tablet, Rfl: 1   dexlansoprazole  (DEXILANT ) 60 MG capsule, TAKE 1 CAPSULE BY MOUTH EVERY DAY, Disp: 90 capsule, Rfl: 1   diclofenac  Sodium (VOLTAREN  ARTHRITIS PAIN) 1 % GEL, Apply 2 g topically 4 (four) times daily., Disp: 100 g, Rfl: 1   diphenhydrAMINE  (BENADRYL ) 25 MG tablet, Take 1 tablet (25 mg total) by mouth every 6 (six) hours as needed for up to 20 doses (migraine). Take with reglan  for migraine headache, Disp: 20 tablet, Rfl: 0   ELDERBERRY PO, Take 1 tablet by mouth daily., Disp: , Rfl:    ferrous sulfate 324 (65 Fe) MG  TBEC, Take 325 mg by mouth 2 (two) times daily., Disp: , Rfl:    fish oil-omega-3 fatty acids  1000 MG capsule, Take 1 g by mouth daily., Disp: , Rfl:    Flaxseed, Linseed, (FLAXSEED OIL MAX STR PO), Take 1 capsule by mouth daily. , Disp: , Rfl:    furosemide  (LASIX ) 20 MG tablet, TAKE 1 TABLET BY MOUTH EVERY DAY AS NEEDED, Disp: 90 tablet, Rfl: 1   gabapentin  (NEURONTIN ) 300 MG capsule, TAKE 1 CAPSULE BY MOUTH THREE TIMES A DAY, Disp: 90 capsule, Rfl: 3   gabapentin  (NEURONTIN ) 600 MG tablet, TAKE 1 TABLET (600 MG TOTAL) BY MOUTH IN THE MORNING, AT NOON, IN THE EVENING, AND AT BEDTIME., Disp: 120 tablet, Rfl: 3   meloxicam  (MOBIC ) 7.5 MG tablet, TAKE 1 TABLET BY MOUTH EVERY DAY, Disp: 90 tablet, Rfl: 1   Multiple Vitamins-Minerals (MULTIVITAMIN & MINERAL PO), Take 1 tablet by mouth daily., Disp: , Rfl:    mupirocin  ointment (BACTROBAN ) 2 %, Apply 1 Application topically 2 (two) times daily. (Patient taking differently: Apply 1 Application topically as needed.), Disp: 22 g, Rfl: 0   ondansetron  (ZOFRAN ) 4 MG tablet, TAKE 1 TABLET BY MOUTH DAILY AS NEEDED FOR NAUSEA OR VOMITING, Disp: 30 tablet, Rfl: 1   OVER THE COUNTER  MEDICATION, Take 125 mg by mouth daily. [place]/Simethicone, Disp: , Rfl:    polyethylene glycol (MIRALAX / GLYCOLAX) 17 g packet, Take 17 g by mouth daily as needed., Disp: , Rfl:    propranolol  (INDERAL ) 10 MG tablet, Take 1 tablet (10 mg total) by mouth 2 (two) times daily. *HOLD IF SYSTOLIC BP (TOP #) IS <100MMHG OR PULSE IS <60BPM, Disp: 180 tablet, Rfl: 3   raNITIdine HCl (RANITIDINE 75 PO), Take 75 mg by mouth daily as needed., Disp: , Rfl:    sacubitril-valsartan (ENTRESTO ) 24-26 MG, Take 1 tablet by mouth 2 (two) times daily., Disp: 180 tablet, Rfl: 3   Semaglutide , 2 MG/DOSE, (OZEMPIC , 2 MG/DOSE,) 8 MG/3ML SOPN, Inject 2 mg into the skin once a week., Disp: 9 mL, Rfl: 2   spironolactone  (ALDACTONE ) 25 MG tablet, TAKE 1 TABLET BY MOUTH EVERY DAY IN THE MORNING, Disp: 90 tablet, Rfl: 2   SYMBICORT  80-4.5 MCG/ACT inhaler, INHALE 2 PUFFS INTO THE LUNGS IN THE MORNING AND AT BEDTIME. (Patient taking differently: Inhale 2 puffs into the lungs 2 (two) times daily as needed.), Disp: 30.6 each, Rfl: 1   triamcinolone  cream (KENALOG ) 0.1 %, APPLY TO AFFECTED AREA (RASH) TWICE A DAY AS NEEDED, Disp: 60 g, Rfl: 1   Turmeric 500 MG CAPS, Take by mouth., Disp: , Rfl:    venlafaxine  XR (EFFEXOR -XR) 75 MG 24 hr capsule, Take 1 capsule (75 mg total) by mouth in the morning and at bedtime., Disp: 180 capsule, Rfl: 1   vitamin E 200 UNIT capsule, Take 200 Units by mouth daily., Disp: , Rfl:    atorvastatin  (LIPITOR) 20 MG tablet, Take 1 tablet (20 mg total) by mouth daily., Disp: 90 tablet, Rfl: 1   nitrofurantoin , macrocrystal-monohydrate, (MACROBID ) 100 MG capsule, Take 1 capsule (100 mg total) by mouth 2 (two) times daily for 5 days., Disp: 10 capsule, Rfl: 0   Suzetrigine  (JOURNAVX ) 50 MG TABS, Take 1 tablet by mouth 2 (two) times daily as needed., Disp: 60 tablet, Rfl: 0   topiramate  (TOPAMAX ) 25 MG tablet, Take 1 tablet (25 mg total) by mouth daily as needed (breakthrough pain)., Disp: 90 tablet, Rfl: 3    Allergies  Allergen  Reactions   Aspirin     unknown   Penicillins Hives and Swelling    Has patient had a PCN reaction causing immediate rash, facial/tongue/throat swelling, SOB or lightheadedness with hypotension: Yes Has patient had a PCN reaction causing severe rash involving mucus membranes or skin necrosis: Yes Has patient had a PCN reaction that required hospitalization Yes Has patient had a PCN reaction occurring within the last 10 years: No If all of the above answers are NO, then may proceed with Cephalosporin use.       The patient states she uses status post hysterectomy for birth control. No LMP recorded. Patient has had a hysterectomy.  Negative for: breast discharge, breast lump(s), breast pain and breast self exam. Associated symptoms include abnormal vaginal bleeding. Pertinent negatives include abnormal bleeding (hematology), anxiety, decreased libido, depression, difficulty falling sleep, dyspareunia, history of infertility, nocturia, sexual dysfunction, sleep disturbances, urinary incontinence, urinary urgency, vaginal discharge and vaginal itching. Diet regular.  The patient states her exercise level is   The patient's tobacco use is:  Social History   Tobacco Use  Smoking Status Former   Current packs/day: 0.25   Average packs/day: 0.3 packs/day for 1 year (0.3 ttl pk-yrs)   Types: Cigarettes  Smokeless Tobacco Never  . She has been exposed to passive smoke. The patient's alcohol use is:  Social History   Substance and Sexual Activity  Alcohol Use Not Currently      Review of Systems  Constitutional: Negative.   HENT: Negative.    Eyes: Negative.   Respiratory: Negative.    Cardiovascular: Negative.  Negative for chest pain and palpitations.  Gastrointestinal:  Negative for constipation.  Endocrine: Negative.   Genitourinary: Negative.   Musculoskeletal:  Positive for arthralgias.       Bilateral knee pain  Skin: Negative.   Allergic/Immunologic:  Negative.   Neurological:  Positive for headaches (improved).  Hematological: Negative.   Psychiatric/Behavioral: Negative.       Today's Vitals   03/26/24 1103  BP: 120/70  Pulse: 86  Temp: 97.7 F (36.5 C)  TempSrc: Oral  Weight: 273 lb (123.8 kg)  Height: 5' 2 (1.575 m)  PainSc: 6   PainLoc: Generalized   Body mass index is 49.93 kg/m.  Wt Readings from Last 3 Encounters:  03/26/24 273 lb (123.8 kg)  11/28/23 275 lb 14.4 oz (125.1 kg)  11/12/23 270 lb 3.2 oz (122.6 kg)     Objective:  Physical Exam Vitals and nursing note reviewed.  Constitutional:      General: She is not in acute distress.    Appearance: Normal appearance. She is well-developed. She is obese.  HENT:     Head: Normocephalic and atraumatic.     Right Ear: Hearing, tympanic membrane, ear canal and external ear normal. There is no impacted cerumen.     Left Ear: Hearing, tympanic membrane, ear canal and external ear normal. There is no impacted cerumen.     Nose: Nose normal.     Mouth/Throat:     Mouth: Mucous membranes are moist.  Eyes:     General: Lids are normal.     Extraocular Movements: Extraocular movements intact.     Conjunctiva/sclera: Conjunctivae normal.     Pupils: Pupils are equal, round, and reactive to light.     Funduscopic exam:    Right eye: No papilledema.        Left eye: No papilledema.  Neck:     Thyroid : No thyroid  mass.  Vascular: No carotid bruit.  Cardiovascular:     Rate and Rhythm: Normal rate and regular rhythm.     Pulses: Normal pulses.     Heart sounds: Normal heart sounds. No murmur heard. Pulmonary:     Effort: Pulmonary effort is normal. No respiratory distress.     Breath sounds: Normal breath sounds. No wheezing.  Chest:     Chest wall: No mass or swelling.  Breasts:    Tanner Score is 5.     Right: Normal. No swelling, mass or tenderness.     Left: Normal. No swelling, mass or tenderness.  Abdominal:     General: Bowel sounds are normal.  There is no distension.     Palpations: Abdomen is soft.     Tenderness: There is no abdominal tenderness (right lower abdomen area).     Hernia: No hernia is present.  Musculoskeletal:        General: No swelling or tenderness. Normal range of motion.     Cervical back: Full passive range of motion without pain, normal range of motion and neck supple.     Right lower leg: No edema.     Left lower leg: No edema.  Lymphadenopathy:     Upper Body:     Right upper body: No supraclavicular, axillary or pectoral adenopathy.     Left upper body: No supraclavicular, axillary or pectoral adenopathy.  Skin:    General: Skin is warm and dry.     Capillary Refill: Capillary refill takes less than 2 seconds.     Coloration: Skin is not jaundiced.     Findings: No bruising.  Neurological:     General: No focal deficit present.     Mental Status: She is alert and oriented to person, place, and time.     Cranial Nerves: No cranial nerve deficit.     Sensory: No sensory deficit.     Motor: No weakness.  Psychiatric:        Mood and Affect: Mood normal.        Behavior: Behavior normal.        Thought Content: Thought content normal.        Judgment: Judgment normal.       Diabetic foot exam was performed with the following findings:   No deformities, ulcerations, or other skin breakdown Normal sensation of 10g monofilament Intact posterior tibialis and dorsalis pedis pulses     Assessment And Plan:     Encounter for annual health examination Assessment & Plan: Routine wellness visit focused on health maintenance. - Scheduled Medicare annual wellness visit before year-end, possibly by phone. - Ordered bone density scan at [facility] for osteoporosis screening. - Ordered diagnostic mammogram for right breast. - Ordered urine test in the lab.   Essential hypertension Assessment & Plan: Hypertension management ongoing with current medications.  Orders: -     POCT URINALYSIS DIP  (CLINITEK) -     Microalbumin / creatinine urine ratio -     CMP14+EGFR  Type 2 diabetes mellitus with diabetic dermatitis, without long-term current use of insulin (HCC) Assessment & Plan: Diabetes management ongoing with current medications, including Ozempic .  Orders: -     CMP14+EGFR -     Hemoglobin A1c  OSA (obstructive sleep apnea)  Need for influenza vaccination Assessment & Plan: Influenza vaccine administered Encouraged to take Tylenol  as needed for fever or muscle aches.   Orders: -     Flu vaccine HIGH DOSE PF(Fluzone Trivalent)  Morbid obesity with BMI of 45.0-49.9, adult [facility]) Assessment & Plan: Contributing to back pain and knee issues. Not exercising due to cold weather and knee pain. - Encouraged weight loss to alleviate back and knee pain. - Discussed potential for future knee replacement surgery.   Other long term (current) drug therapy -     CBC with Differential/Platelet  Mixed hyperlipidemia Assessment & Plan: Cholesterol levels are stable, continue low fat diet  Orders: -     Lipid panel  Psoriasis Assessment & Plan: Continue f/u with Dermatology   Decreased estrogen level -     DG Bone Density; Future  Mass of upper inner quadrant of right breast Assessment & Plan: Lump in right breast with previous mammogram in August. Diagnostic mammogram planned. - Ordered diagnostic mammogram for right breast.  Orders: -     MM 3D DIAGNOSTIC MAMMOGRAM BILATERAL BREAST; Future  Urinary tract infection with hematuria, site unspecified -     Urine Culture    Return for 1 year physical, controlled DM check 4 months, needs AWV ASAP. Patient was given opportunity to ask questions. Patient verbalized understanding of the plan and was able to repeat key elements of the plan. All questions were answered to their satisfaction.   Ashley Ada, FNP  I, Ashley Ada, FNP, have reviewed all documentation for this visit. The documentation on 03/26/2024  for the exam, diagnosis, procedures, and orders are all accurate and complete.

## 2024-03-27 LAB — CBC WITH DIFFERENTIAL/PLATELET
Basophils Absolute: 0 x10E3/uL (ref 0.0–0.2)
Basos: 0 %
EOS (ABSOLUTE): 0.1 x10E3/uL (ref 0.0–0.4)
Eos: 1 %
Hematocrit: 40.8 % (ref 34.0–46.6)
Hemoglobin: 12.6 g/dL (ref 11.1–15.9)
Immature Grans (Abs): 0 x10E3/uL (ref 0.0–0.1)
Immature Granulocytes: 0 %
Lymphocytes Absolute: 2.5 x10E3/uL (ref 0.7–3.1)
Lymphs: 30 %
MCH: 26.4 pg — ABNORMAL LOW (ref 26.6–33.0)
MCHC: 30.9 g/dL — ABNORMAL LOW (ref 31.5–35.7)
MCV: 85 fL (ref 79–97)
Monocytes Absolute: 0.5 x10E3/uL (ref 0.1–0.9)
Monocytes: 6 %
Neutrophils Absolute: 5.1 x10E3/uL (ref 1.4–7.0)
Neutrophils: 62 %
Platelets: 377 x10E3/uL (ref 150–450)
RBC: 4.78 x10E6/uL (ref 3.77–5.28)
RDW: 13.3 % (ref 11.7–15.4)
WBC: 8.2 x10E3/uL (ref 3.4–10.8)

## 2024-03-27 LAB — CMP14+EGFR
ALT: 10 IU/L (ref 0–32)
AST: 16 IU/L (ref 0–40)
Albumin: 4.3 g/dL (ref 3.9–4.9)
Alkaline Phosphatase: 81 IU/L (ref 49–135)
BUN/Creatinine Ratio: 14 (ref 12–28)
BUN: 10 mg/dL (ref 8–27)
Bilirubin Total: 0.3 mg/dL (ref 0.0–1.2)
CO2: 26 mmol/L (ref 20–29)
Calcium: 9.1 mg/dL (ref 8.7–10.3)
Chloride: 98 mmol/L (ref 96–106)
Creatinine, Ser: 0.69 mg/dL (ref 0.57–1.00)
Globulin, Total: 2.8 g/dL (ref 1.5–4.5)
Glucose: 73 mg/dL (ref 70–99)
Potassium: 4.6 mmol/L (ref 3.5–5.2)
Sodium: 137 mmol/L (ref 134–144)
Total Protein: 7.1 g/dL (ref 6.0–8.5)
eGFR: 96 mL/min/1.73 (ref >59)

## 2024-03-27 LAB — LIPID PANEL
Chol/HDL Ratio: 2.7 ratio (ref 0.0–4.4)
Cholesterol, Total: 212 mg/dL — ABNORMAL HIGH (ref 100–199)
HDL: 80 mg/dL (ref >39)
LDL Chol Calc (NIH): 111 mg/dL — ABNORMAL HIGH (ref 0–99)
Triglycerides: 121 mg/dL (ref 0–149)
VLDL Cholesterol Cal: 21 mg/dL (ref 5–40)

## 2024-03-27 LAB — MICROALBUMIN / CREATININE URINE RATIO
Creatinine, Urine: 106.3 mg/dL
Microalb/Creat Ratio: 13 mg/g{creat} (ref 0–29)
Microalbumin, Urine: 13.7 ug/mL

## 2024-03-27 LAB — HEMOGLOBIN A1C
Est. average glucose Bld gHb Est-mCnc: 120 mg/dL
Hgb A1c MFr Bld: 5.8 % — ABNORMAL HIGH (ref 4.8–5.6)

## 2024-03-28 ENCOUNTER — Encounter: Payer: Self-pay | Admitting: Nurse Practitioner

## 2024-03-30 LAB — URINE CULTURE

## 2024-04-01 ENCOUNTER — Encounter: Payer: Self-pay | Admitting: Cardiology

## 2024-04-01 ENCOUNTER — Ambulatory Visit: Payer: Self-pay | Admitting: Nurse Practitioner

## 2024-04-01 DIAGNOSIS — N3000 Acute cystitis without hematuria: Secondary | ICD-10-CM

## 2024-04-01 MED ORDER — ATORVASTATIN CALCIUM 20 MG PO TABS: 20.0000 mg | ORAL_TABLET | Freq: Every day | ORAL | 1 refills | Status: DC

## 2024-04-01 MED ORDER — NITROFURANTOIN MONOHYD MACRO 100 MG PO CAPS: 100.0000 mg | ORAL_CAPSULE | Freq: Two times a day (BID) | ORAL | 0 refills | Status: AC

## 2024-04-03 ENCOUNTER — Other Ambulatory Visit: Payer: Self-pay | Admitting: Nurse Practitioner

## 2024-04-03 ENCOUNTER — Ambulatory Visit

## 2024-04-03 DIAGNOSIS — Z0000 Encounter for general adult medical examination without abnormal findings: Secondary | ICD-10-CM

## 2024-04-03 DIAGNOSIS — G4733 Obstructive sleep apnea (adult) (pediatric): Secondary | ICD-10-CM

## 2024-04-03 DIAGNOSIS — E11620 Type 2 diabetes mellitus with diabetic dermatitis: Secondary | ICD-10-CM

## 2024-04-03 DIAGNOSIS — M797 Fibromyalgia: Secondary | ICD-10-CM

## 2024-04-03 DIAGNOSIS — I10 Essential (primary) hypertension: Secondary | ICD-10-CM

## 2024-04-03 DIAGNOSIS — E782 Mixed hyperlipidemia: Secondary | ICD-10-CM

## 2024-04-03 NOTE — Patient Instructions (Signed)

## 2024-04-03 NOTE — Progress Notes (Cosign Needed)
 Chief Complaint  Patient presents with   Annual Exam     Subjective:   Ashley Lindsey is a 63 y.o. female who presents for a Medicare Annual Wellness Visit.  Visit info / Clinical Intake: Medicare Wellness Visit Type:: Subsequent Annual Wellness Visit Persons participating in visit and providing information:: patient Medicare Wellness Visit Mode:: Telephone If telephone:: video error Since this visit was completed virtually, some vitals may be partially provided or unavailable. Missing vitals are due to the limitations of the virtual format.: Unable to obtain vitals - no equipment Patient Location:: home Provider Location:: office Interpreter Needed?: No Pre-visit prep was completed: yes AWV questionnaire completed by patient prior to visit?: no Living arrangements:: with family/others Patient's Overall Health Status Rating: (!) fair Typical amount of pain: some Does pain affect daily life?: no Are you currently prescribed opioids?: no  Dietary Habits and Nutritional Risks How many meals a day?: 2 Eats fruit and vegetables daily?: yes Most meals are obtained by: preparing own meals In the last 2 weeks, have you had any of the following?: none Diabetic:: (!) yes Any non-healing wounds?: no How often do you check your BS?: 1; continuous glucose monitor Would you like to be referred to a Nutritionist or for Diabetic Management? : no  Functional Status Activities of Daily Living (to include ambulation/medication): Independent Ambulation: Independent Medication Administration: Independent Home Management (perform basic housework or laundry): Independent Manage your own finances?: yes Primary transportation is: driving Concerns about vision?: no *vision screening is required for WTM* Concerns about hearing?: no  Fall Screening Falls in the past year?: 1 Number of falls in past year: 1 Was there an injury with Fall?: 0 Fall Risk Category Calculator: 2 Patient Fall Risk  Level: Moderate Fall Risk  Fall Risk Patient at Risk for Falls Due to: History of fall(s) Fall risk Follow up: Falls evaluation completed  Home and Transportation Safety: All rugs have non-skid backing?: yes All stairs or steps have railings?: yes Grab bars in the bathtub or shower?: yes Have non-skid surface in bathtub or shower?: yes Good home lighting?: yes Regular seat belt use?: yes Hospital stays in the last year:: no  Cognitive Assessment Difficulty concentrating, remembering, or making decisions? : no Will 6CIT or Mini Cog be Completed: yes What year is it?: 0 points What month is it?: 0 points Give patient an address phrase to remember (5 components): [address] [place] About what time is it?: 0 points Count backwards from 20 to 1: 0 points Say the months of the year in reverse: 0 points Repeat the address phrase from earlier: 2 points 6 CIT Score: 2 points  Advance Directives (For Healthcare) Does Patient Have a Medical Advance Directive?: Yes Does patient want to make changes to medical advance directive?: No - Patient declined  Reviewed/Updated  Reviewed/Updated: Reviewed All (Medical, Surgical, Family, Medications, Allergies, Care Teams, Patient Goals)    Allergies (verified) Aspirin and Penicillins   Current Medications (verified) Outpatient Encounter Medications as of 04/03/2024  Medication Sig   acetaminophen  (TYLENOL ) 650 MG CR tablet Take 650 mg by mouth every 8 (eight) hours as needed for pain.   albuterol  (VENTOLIN  HFA) 108 (90 Base) MCG/ACT inhaler Inhale 2 puffs into the lungs every 4 (four) hours as needed for wheezing or shortness of breath.   atorvastatin  (LIPITOR) 20 MG tablet Take 1 tablet (20 mg total) by mouth daily.   Bisacodyl (DULCOLAX PO) Take 1 capsule by mouth daily as needed (constipation).  cholecalciferol  5000 units TABS Take 1 tablet (5,000 Units total) by mouth 2 (two) times daily. (Patient taking differently: Take 4,000  Units by mouth daily.)   cyclobenzaprine  (FLEXERIL ) 10 MG tablet TAKE 1 TABLET BY MOUTH EVERY DAY AS NEEDED   dexlansoprazole  (DEXILANT ) 60 MG capsule TAKE 1 CAPSULE BY MOUTH EVERY DAY   diclofenac  Sodium (VOLTAREN  ARTHRITIS PAIN) 1 % GEL Apply 2 g topically 4 (four) times daily.   diphenhydrAMINE  (BENADRYL ) 25 MG tablet Take 1 tablet (25 mg total) by mouth every 6 (six) hours as needed for up to 20 doses (migraine). Take with reglan  for migraine headache   ELDERBERRY PO Take 1 tablet by mouth daily.   ferrous sulfate 324 (65 Fe) MG TBEC Take 325 mg by mouth 2 (two) times daily.   fish oil-omega-3 fatty acids  1000 MG capsule Take 1 g by mouth daily.   Flaxseed, Linseed, (FLAXSEED OIL MAX STR PO) Take 1 capsule by mouth daily.    furosemide  (LASIX ) 20 MG tablet TAKE 1 TABLET BY MOUTH EVERY DAY AS NEEDED   gabapentin  (NEURONTIN ) 300 MG capsule TAKE 1 CAPSULE BY MOUTH THREE TIMES A DAY   gabapentin  (NEURONTIN ) 600 MG tablet TAKE 1 TABLET (600 MG TOTAL) BY MOUTH IN THE MORNING, AT NOON, IN THE EVENING, AND AT BEDTIME.   meloxicam  (MOBIC ) 7.5 MG tablet TAKE 1 TABLET BY MOUTH EVERY DAY   Multiple Vitamins-Minerals (MULTIVITAMIN & MINERAL PO) Take 1 tablet by mouth daily.   mupirocin  ointment (BACTROBAN ) 2 % Apply 1 Application topically 2 (two) times daily. (Patient taking differently: Apply 1 Application topically as needed.)   nitrofurantoin , macrocrystal-monohydrate, (MACROBID ) 100 MG capsule Take 1 capsule (100 mg total) by mouth 2 (two) times daily for 5 days.   ondansetron  (ZOFRAN ) 4 MG tablet TAKE 1 TABLET BY MOUTH DAILY AS NEEDED FOR NAUSEA OR VOMITING   OVER THE COUNTER MEDICATION Take 125 mg by mouth daily. [place]/Simethicone   polyethylene glycol (MIRALAX / GLYCOLAX) 17 g packet Take 17 g by mouth daily as needed.   propranolol  (INDERAL ) 10 MG tablet Take 1 tablet (10 mg total) by mouth 2 (two) times daily. *HOLD IF SYSTOLIC BP (TOP #) IS <100MMHG OR PULSE IS <60BPM   raNITIdine HCl  (RANITIDINE 75 PO) Take 75 mg by mouth daily as needed.   sacubitril-valsartan (ENTRESTO ) 24-26 MG Take 1 tablet by mouth 2 (two) times daily.   Semaglutide , 2 MG/DOSE, (OZEMPIC , 2 MG/DOSE,) 8 MG/3ML SOPN Inject 2 mg into the skin once a week.   spironolactone  (ALDACTONE ) 25 MG tablet TAKE 1 TABLET BY MOUTH EVERY DAY IN THE MORNING   Suzetrigine  (JOURNAVX ) 50 MG TABS Take 1 tablet by mouth 2 (two) times daily as needed.   SYMBICORT  80-4.5 MCG/ACT inhaler INHALE 2 PUFFS INTO THE LUNGS IN THE MORNING AND AT BEDTIME. (Patient taking differently: Inhale 2 puffs into the lungs 2 (two) times daily as needed.)   topiramate  (TOPAMAX ) 25 MG tablet Take 1 tablet (25 mg total) by mouth daily as needed (breakthrough pain).   triamcinolone  cream (KENALOG ) 0.1 % APPLY TO AFFECTED AREA (RASH) TWICE A DAY AS NEEDED   Turmeric 500 MG CAPS Take by mouth.   venlafaxine  XR (EFFEXOR -XR) 75 MG 24 hr capsule Take 1 capsule (75 mg total) by mouth in the morning and at bedtime.   vitamin E 200 UNIT capsule Take 200 Units by mouth daily.   No facility-administered encounter medications on file as of 04/03/2024.    History: Past Medical History:  Diagnosis Date   Abdominal cramps    Anxiety    Arthritis    breast cancer    s/p radiation- last dose 6/12//has been off Tamoxifen  8/12   Chronic headaches    Costochondritis    Dyspnea 09/21/2014   Fatigue    Fibromyalgia 03/06/2013   GERD (gastroesophageal reflux disease)    History of COVID-19    Hypertension    Lymphedema    RIGHT ARM-////  STATES USE LEFT ARM FOR BP'S   Migraines    Muscle spasms of head and/or neck    following to the breast   Passed out    4th of july   Personal history of radiation therapy 2010   lt breast    S/P radiation therapy 08/19/07 - 10/10/07   Left Breast/5040 cGy/28 fractions with Boost for a toatl dose of 6300 dGy   S/P radiation therapy 08/14/10 -10/03/10   right breast   Sleep apnea    STOP BANG SCORE 5   Past Surgical  History:  Procedure Laterality Date   ABDOMINAL HYSTERECTOMY     with left salpingooophorectomy   BIOPSY  04/30/2022   Procedure: BIOPSY;  Surgeon: Legrand Victory LITTIE DOUGLAS, MD;  Location: [facility];  Service: Gastroenterology;;   BREAST LUMPECTOMY  2009/2012   LEFT/RIGHT with lymph node dissection   COLONOSCOPY WITH PROPOFOL  N/A 04/30/2022   Procedure: COLONOSCOPY WITH PROPOFOL ;  Surgeon: Legrand Victory LITTIE DOUGLAS, MD;  Location: [facility];  Service: Gastroenterology;  Laterality: N/A;   ESOPHAGOGASTRODUODENOSCOPY (EGD) WITH PROPOFOL  N/A 04/30/2022   Procedure: ESOPHAGOGASTRODUODENOSCOPY (EGD) WITH PROPOFOL ;  Surgeon: Legrand Victory LITTIE DOUGLAS, MD;  Location: [facility];  Service: Gastroenterology;  Laterality: N/A;   HOT HEMOSTASIS N/A 04/30/2022   Procedure: HOT HEMOSTASIS (ARGON PLASMA COAGULATION/BICAP);  Surgeon: Legrand Victory LITTIE DOUGLAS, MD;  Location: [facility];  Service: Gastroenterology;  Laterality: N/A;   KNEE ARTHROSCOPY     right   OOPHORECTOMY  2013   Family History  Problem Relation Age of Onset   Breast cancer Mother 78   Heart disease Father    Diabetes Sister    Prostate cancer Maternal Grandfather 54   Cancer Maternal Aunt 63       d. from female cancer possibly cervical   Breast cancer Maternal Aunt 60   Prostate cancer Maternal Uncle 78   Prostate cancer Maternal Uncle 80   Prostate cancer Paternal Uncle    Prostate cancer Paternal Uncle    Breast cancer Cousin 30       mat 1st cousin   Social History   Occupational History   Occupation: disability  Tobacco Use   Smoking status: Former    Current packs/day: 0.25    Average packs/day: 0.3 packs/day for 1 year (0.3 ttl pk-yrs)    Types: Cigarettes   Smokeless tobacco: Never  Vaping Use   Vaping status: Never Used  Substance and Sexual Activity   Alcohol use: Not Currently   Drug use: No   Sexual activity: Not Currently    Birth control/protection: Surgical   Tobacco Counseling Counseling given: Not Answered  SDOH  Screenings   Food Insecurity: No Food Insecurity (04/03/2024)  Housing: Unknown (04/03/2024)  Transportation Needs: No Transportation Needs (04/03/2024)  Utilities: Not At Risk (02/13/2023)  Alcohol Screen: Low Risk (02/13/2023)  Depression (PHQ2-9): Low Risk (04/03/2024)  Financial Resource Strain: Low Risk (02/13/2023)  Physical Activity: Inactive (04/03/2024)  Social Connections: Moderately Integrated (04/03/2024)  Stress: No Stress Concern Present (04/03/2024)  Tobacco Use:  Medium Risk (03/26/2024)  Health Literacy: Adequate Health Literacy (04/03/2024)   See flowsheets for full screening details  Depression Screen PHQ 2 & 9 Depression Scale- Over the past 2 weeks, how often have you been bothered by any of the following problems? Little interest or pleasure in doing things: 0 Feeling down, depressed, or hopeless (PHQ Adolescent also includes...irritable): 0 PHQ-2 Total Score: 0 Trouble falling or staying asleep, or sleeping too much: 0 Feeling tired or having little energy: 0 Poor appetite or overeating (PHQ Adolescent also includes...weight loss): 0 Feeling bad about yourself - or that you are a failure or have let yourself or your family down: 0 Trouble concentrating on things, such as reading the newspaper or watching television (PHQ Adolescent also includes...like school work): 0 Moving or speaking so slowly that other people could have noticed. Or the opposite - being so fidgety or restless that you have been moving around a lot more than usual: 0 Thoughts that you would be better off dead, or of hurting yourself in some way: 0 PHQ-9 Total Score: 1 If you checked off any problems, how difficult have these problems made it for you to do your work, take care of things at home, or get along with other people?: Not difficult at all  Depression Treatment Depression Interventions/Treatment : Currently on Treatment; PHQ2-9 Score <4 Follow-up Not Indicated     Goals Addressed    None          Objective:    There were no vitals filed for this visit. There is no height or weight on file to calculate BMI.  Hearing/Vision screen No results found. Immunizations and Health Maintenance Health Maintenance  Topic Date Due   OPHTHALMOLOGY EXAM  Never done   FOOT EXAM  07/05/2023   Bone Density Scan  02/10/2024   Mammogram  04/04/2024   COVID-19 Vaccine (4 - 2025-26 season) 04/11/2024 (Originally 12/23/2023)   Pneumococcal Vaccine: 50+ Years (2 of 2 - PCV) 03/26/2025 (Originally 02/08/2022)   HEMOGLOBIN A1C  09/24/2024   Diabetic kidney evaluation - eGFR measurement  03/26/2025   Diabetic kidney evaluation - Urine ACR  03/26/2025   Medicare Annual Wellness (AWV)  04/03/2025   Colonoscopy  04/30/2032   DTaP/Tdap/Td (3 - Td or Tdap) 11/19/2032   Influenza Vaccine  Completed   Hepatitis C Screening  Completed   HIV Screening  Completed   Zoster Vaccines- Shingrix   Completed   Hepatitis B Vaccines 19-59 Average Risk  Aged Out   Meningococcal B Vaccine  Aged Out        Assessment/Plan:  This is a routine wellness examination for Ashley Lindsey.  Patient Care Team: Georgina Speaks, FNP as PCP - General (General Practice) Michele Richardson, DO as PCP - Cardiology (Cardiology) Shelah Lamar RAMAN, MD as Consulting Physician (Pulmonary Disease) [place], [place], [place] as Referring Physician (Optometry) Ines Onetha NOVAK, MD as Referring Physician (Neurology)  I have personally reviewed and noted the following in the patients chart:   Medical and social history Use of alcohol, tobacco or illicit drugs  Current medications and supplements including opioid prescriptions. Functional ability and status Nutritional status Physical activity Advanced directives List of other physicians Hospitalizations, surgeries, and ER visits in previous 12 months Vitals Screenings to include cognitive, depression, and falls Referrals and appointments  No orders of the defined types were placed in  this encounter.  In addition, I have reviewed and discussed with patient certain preventive protocols, quality metrics, and best practice recommendations. A written  personalized care plan for preventive services as well as general preventive health recommendations were provided to patient.   Ashley Lindsey, CMA   04/03/2024   Return in 1 year (on 04/03/2025).  After Visit Summary: (MyChart) Due to this being a telephonic visit, the after visit summary with patients personalized plan was offered to patient via MyChart   Nurse Notes: Ashley Lindsey, CMA

## 2024-04-05 DIAGNOSIS — N6312 Unspecified lump in the right breast, upper inner quadrant: Secondary | ICD-10-CM | POA: Insufficient documentation

## 2024-04-05 NOTE — Assessment & Plan Note (Signed)
 Hypertension management ongoing with current medications.

## 2024-04-05 NOTE — Assessment & Plan Note (Signed)
 Routine wellness visit focused on health maintenance. - Scheduled Medicare annual wellness visit before year-end, possibly by phone. - Ordered bone density scan at [facility] for osteoporosis screening. - Ordered diagnostic mammogram for right breast. - Ordered urine test in the lab.

## 2024-04-05 NOTE — Assessment & Plan Note (Signed)
Continue f/u with Dermatology.

## 2024-04-05 NOTE — Assessment & Plan Note (Signed)
 Diabetes management ongoing with current medications, including Ozempic .

## 2024-04-05 NOTE — Assessment & Plan Note (Signed)
 Influenza vaccine administered Encouraged to take Tylenol as needed for fever or muscle aches.

## 2024-04-05 NOTE — Assessment & Plan Note (Signed)
 Contributing to back pain and knee issues. Not exercising due to cold weather and knee pain. - Encouraged weight loss to alleviate back and knee pain. - Discussed potential for future knee replacement surgery.

## 2024-04-05 NOTE — Assessment & Plan Note (Signed)
 Cholesterol levels are stable, continue low fat diet

## 2024-04-05 NOTE — Assessment & Plan Note (Signed)
 Lump in right breast with previous mammogram in August. Diagnostic mammogram planned. - Ordered diagnostic mammogram for right breast.

## 2024-04-07 ENCOUNTER — Encounter: Payer: Self-pay | Admitting: Physician Assistant

## 2024-04-07 ENCOUNTER — Ambulatory Visit: Admitting: Physician Assistant

## 2024-04-07 DIAGNOSIS — M1712 Unilateral primary osteoarthritis, left knee: Secondary | ICD-10-CM

## 2024-04-07 DIAGNOSIS — M1711 Unilateral primary osteoarthritis, right knee: Secondary | ICD-10-CM

## 2024-04-07 DIAGNOSIS — M2550 Pain in unspecified joint: Secondary | ICD-10-CM | POA: Insufficient documentation

## 2024-04-07 MED ORDER — METHYLPREDNISOLONE ACETATE 40 MG/ML IJ SUSP
40.0000 mg | INTRAMUSCULAR | Status: AC | PRN
  Administered 2024-04-07: 16:00:00 40 mg via INTRA_ARTICULAR

## 2024-04-07 MED ORDER — LIDOCAINE HCL 1 % IJ SOLN
3.0000 mL | INTRAMUSCULAR | Status: AC | PRN
  Administered 2024-04-07: 16:00:00 3 mL

## 2024-04-07 NOTE — Addendum Note (Signed)
 Addended by: EILLEEN ROSINA HERO on: 04/07/2024 03:52 PM   Modules accepted: Orders

## 2024-04-07 NOTE — Progress Notes (Signed)
 Office Visit Note   Patient: Ashley Lindsey           Date of Birth: Jan 23, 1961           MRN: [id number] Visit Date: 04/07/2024              Requested by: Georgina Speaks, FNP [address] [place],  [place] [address] PCP: Georgina Speaks, FNP  Chief Complaint  Patient presents with   Left Knee - Follow-up   Right Knee - Follow-up      HPI: Patient is a pleasant 63 year old woman who comes in periodically for injections into her knees she has no recent history is requesting injections today she is also describes a pattern of polyarthralgia in her hands her knees and her back.  She does have an uncle who has rheumatoid arthritis  Assessment & Plan: Visit Diagnoses:  1. Unilateral primary osteoarthritis, right knee   2. Unilateral primary osteoarthritis, left knee   3. Polyarthralgia     Plan: Santina forward with injections without difficulty will draw an inflammatory panel today May follow-up as needed  Follow-Up Instructions: No follow-ups on file.   Ortho Exam  Patient is alert, oriented, no adenopathy, well-dressed, normal affect, normal respiratory effort. Bilateral knees no effusion no erythema compartments are soft and compressible she is neurovascularly intact    Imaging: No results found. No images are attached to the encounter.  Labs: Lab Results  Component Value Date   HGBA1C 5.8 (H) 03/26/2024   HGBA1C 5.8 (H) 11/12/2023   HGBA1C 5.9 (H) 07/11/2023   ESRSEDRATE 48 (H) 08/26/2019   LABURIC 4.8 08/26/2019   REPTSTATUS 06/12/2017 FINAL 06/10/2017   CULT >=100,000 COLONIES/mL ESCHERICHIA COLI (A) 06/10/2017   LABORGA ESCHERICHIA COLI (A) 06/10/2017     Lab Results  Component Value Date   ALBUMIN 4.3 03/26/2024   ALBUMIN 4.1 02/22/2023   ALBUMIN 4.3 03/29/2022    Lab Results  Component Value Date   MG 1.9 01/16/2022   MG 2.0 02/17/2020   Lab Results  Component Value Date   VD25OH 51.5 11/20/2022   VD25OH 41.6 01/16/2022    VD25OH 32 12/26/2011    No results found for: PREALBUMIN    Latest Ref Rng & Units 03/26/2024   11:58 AM 02/22/2023   11:17 AM 07/05/2022   12:15 PM  CBC EXTENDED  WBC 3.4 - 10.8 x10E3/uL 8.2  9.2  8.9   RBC 3.77 - 5.28 x10E6/uL 4.78  4.63  4.81   Hemoglobin 11.1 - 15.9 g/dL 87.3  88.1  87.3   HCT 34.0 - 46.6 % 40.8  39.5  39.3   Platelets 150 - 450 x10E3/uL 377  372  351.0   NEUT# 1.4 - 7.0 x10E3/uL 5.1   5.4   Lymph# 0.7 - 3.1 x10E3/uL 2.5   2.7      There is no height or weight on file to calculate BMI.  Orders:  No orders of the defined types were placed in this encounter.  No orders of the defined types were placed in this encounter.    Procedures: Large Joint Inj: bilateral knee on 04/07/2024 3:38 PM Indications: pain and diagnostic evaluation Details: 25 G 1.5 in needle, anteromedial approach  Arthrogram: No  Medications (Right): 3 mL lidocaine  1 %; 40 mg methylPREDNISolone  acetate 40 MG/ML Medications (Left): 3 mL lidocaine  1 %; 40 mg methylPREDNISolone  acetate 40 MG/ML Outcome: tolerated well, no immediate complications Procedure, treatment alternatives, risks and benefits explained, specific risks discussed.  Consent was given by the patient.     Clinical Data: No additional findings.  ROS:  All other systems negative, except as noted in the HPI. Review of Systems  Objective: Vital Signs: There were no vitals taken for this visit.  Specialty Comments:  No specialty comments available.  PMFS History: Patient Active Problem List   Diagnosis Date Noted   Polyarthralgia 04/07/2024   Mass of upper inner quadrant of right breast 04/05/2024   Morbid obesity with BMI of 45.0-49.9, adult (HCC) 11/20/2023   Psoriasis 11/20/2023   Noncompliance with CPAP treatment 11/05/2023   Rash 07/22/2023   Pelvic pain 07/22/2023   Mixed hyperlipidemia 03/20/2023   Encounter for annual health examination 03/14/2023   COVID-19 vaccination declined  03/14/2023   Class 3 severe obesity due to excess calories with serious comorbidity and body mass index (BMI) of 50.0 to 59.9 in adult [facility]) 03/14/2023   Need for influenza vaccination 03/14/2023   Acute cough 02/20/2023   Right sided abdominal pain 02/19/2023   Hematuria 02/19/2023   Urinary tract infection 12/31/2022   Cervicogenic headache 12/31/2022   Urinary frequency 11/28/2022   Urinary tract infection without hematuria 11/28/2022   Elevated cholesterol 11/20/2022   Vitamin D  deficiency 11/20/2022   Tibialis posterior tendinitis 10/08/2022   OSA (obstructive sleep apnea) 09/04/2022   Claustrophobia 09/04/2022   Suboptimal nutrition 07/05/2022   Cardiomyopathy, unspecified type (HCC) 07/05/2022   Essential hypertension 07/05/2022   Type 2 diabetes mellitus with diabetic dermatitis, without long-term current use of insulin (HCC) 07/05/2022   Chronic gastric ulcer without hemorrhage and without perforation 04/30/2022   Gastric AVM 04/30/2022   Iron deficiency anemia 03/06/2022   Unilateral primary osteoarthritis, left knee 10/02/2021   Pulmonary nodules 07/12/2021   Chronic cough 07/12/2021   Avulsion fracture of middle phalanx of finger 04/18/2021   Tibial plateau fracture, left 04/18/2021   Asymmetrical sensorineural hearing loss 02/02/2021   Bilateral primary osteoarthritis of knee 02/09/2020   Chronic intermittent hypoxia with obstructive sleep apnea 08/24/2019   Chronic intractable headache 07/14/2019   Morning headache 07/14/2019   Loud snoring 07/14/2019   Super obesity 07/14/2019   Sleeps in sitting position due to orthopnea 07/14/2019   Chest pain due to GERD 07/14/2019   Nocturia more than twice per night 07/14/2019   Unilateral primary osteoarthritis, right knee 05/26/2019   Chronic migraine without aura without status migrainosus, not intractable 05/13/2019   Class 3 severe obesity due to excess calories without serious  comorbidity with body mass index (BMI) of 40.0 to 44.9 in adult [facility]) 08/05/2018   Depression 08/05/2018   Chronic nonintractable headache 08/05/2018   Chronic pain of right ankle 04/24/2018   Breast cancer of lower-outer quadrant of right female breast (HCC) 12/02/2014   Bronchospasm 09/21/2014   Chronic pain of right knee 09/21/2014   Fibromyalgia 03/06/2013   Dense breasts 03/06/2013   Mastalgia 03/06/2013   Back pain, lumbosacral 03/06/2013   Lymphedema of arm 03/06/2013   Atypical chest pain 01/06/2013   Breast cancer of upper-inner quadrant of left female breast (HCC) 09/08/2012   Family history of malignant neoplasm of breast 09/08/2012   S/P radiation therapy    History of breast cancer in female 08/13/2011   Past Medical History:  Diagnosis Date   Abdominal cramps    Anxiety    Arthritis    breast cancer    s/p radiation- last dose 6/12//has been off Tamoxifen  8/12   Chronic headaches    Costochondritis  Dyspnea 09/21/2014   Fatigue    Fibromyalgia 03/06/2013   GERD (gastroesophageal reflux disease)    History of COVID-19    Hypertension    Lymphedema    RIGHT ARM-////  STATES USE LEFT ARM FOR BP'S   Migraines    Muscle spasms of head and/or neck    following to the breast   Passed out    4th of july   Personal history of radiation therapy 2010   lt breast    S/P radiation therapy 08/19/07 - 10/10/07   Left Breast/5040 cGy/28 fractions with Boost for a toatl dose of 6300 dGy   S/P radiation therapy 08/14/10 -10/03/10   right breast   Sleep apnea    STOP BANG SCORE 5    Family History  Problem Relation Age of Onset   Breast cancer Mother 53   Heart disease Father    Diabetes Sister    Prostate cancer Maternal Grandfather 26   Cancer Maternal Aunt 63       d. from female cancer possibly cervical   Breast cancer Maternal Aunt 60   Prostate cancer Maternal Uncle 78   Prostate cancer Maternal Uncle 80    Prostate cancer Paternal Uncle    Prostate cancer Paternal Uncle    Breast cancer Cousin 30       mat 1st cousin    Past Surgical History:  Procedure Laterality Date   ABDOMINAL HYSTERECTOMY     with left salpingooophorectomy   BIOPSY  04/30/2022   Procedure: BIOPSY;  Surgeon: Legrand Victory LITTIE DOUGLAS, MD;  Location: [facility];  Service: Gastroenterology;;   BREAST LUMPECTOMY  2009/2012   LEFT/RIGHT with lymph node dissection   COLONOSCOPY WITH PROPOFOL  N/A 04/30/2022   Procedure: COLONOSCOPY WITH PROPOFOL ;  Surgeon: Legrand Victory LITTIE DOUGLAS, MD;  Location: [facility];  Service: Gastroenterology;  Laterality: N/A;   ESOPHAGOGASTRODUODENOSCOPY (EGD) WITH PROPOFOL  N/A 04/30/2022   Procedure: ESOPHAGOGASTRODUODENOSCOPY (EGD) WITH PROPOFOL ;  Surgeon: Legrand Victory LITTIE DOUGLAS, MD;  Location: [facility];  Service: Gastroenterology;  Laterality: N/A;   HOT HEMOSTASIS N/A 04/30/2022   Procedure: HOT HEMOSTASIS (ARGON PLASMA COAGULATION/BICAP);  Surgeon: Legrand Victory LITTIE DOUGLAS, MD;  Location: [facility];  Service: Gastroenterology;  Laterality: N/A;   KNEE ARTHROSCOPY     right   OOPHORECTOMY  2013   Social History   Occupational History   Occupation: disability  Tobacco Use   Smoking status: Former    Current packs/day: 0.25    Average packs/day: 0.3 packs/day for 1 year (0.3 ttl pk-yrs)    Types: Cigarettes   Smokeless tobacco: Never  Vaping Use   Vaping status: Never Used  Substance and Sexual Activity   Alcohol use: Not Currently   Drug use: No   Sexual activity: Not Currently    Birth control/protection: Surgical

## 2024-04-09 LAB — CBC WITH DIFFERENTIAL/PLATELET
Absolute Lymphocytes: 2487 {cells}/uL (ref 850–3900)
Absolute Monocytes: 524 {cells}/uL (ref 200–950)
Basophils Absolute: 23 {cells}/uL (ref 0–200)
Basophils Relative: 0.3 %
Eosinophils Absolute: 139 {cells}/uL (ref 15–500)
Eosinophils Relative: 1.8 %
HCT: 39.1 % (ref 35.9–46.0)
Hemoglobin: 12.1 g/dL (ref 11.7–15.5)
MCH: 26.2 pg — ABNORMAL LOW (ref 27.0–33.0)
MCHC: 30.9 g/dL — ABNORMAL LOW (ref 31.6–35.4)
MCV: 84.8 fL (ref 81.4–101.7)
MPV: 10.3 fL (ref 7.5–12.5)
Monocytes Relative: 6.8 %
Neutro Abs: 4528 {cells}/uL (ref 1500–7800)
Neutrophils Relative %: 58.8 %
Platelets: 297 Thousand/uL (ref 140–400)
RBC: 4.61 Million/uL (ref 3.80–5.10)
RDW: 13.1 % (ref 11.0–15.0)
Total Lymphocyte: 32.3 %
WBC: 7.7 Thousand/uL (ref 3.8–10.8)

## 2024-04-09 LAB — RHEUMATOID FACTOR: Rheumatoid fact SerPl-aCnc: <10 [IU]/mL (ref <14)

## 2024-04-09 LAB — ANA: Anti Nuclear Antibody (ANA): POSITIVE — AB

## 2024-04-09 LAB — SEDIMENTATION RATE: Sed Rate: 31 mm/h — ABNORMAL HIGH (ref 0–30)

## 2024-04-09 LAB — ANTI-NUCLEAR AB-TITER (ANA TITER): ANA Titer 1: 1:80 {titer} — ABNORMAL HIGH

## 2024-04-09 LAB — C-REACTIVE PROTEIN: CRP: 12.6 mg/L — ABNORMAL HIGH (ref <8.0)

## 2024-04-10 ENCOUNTER — Inpatient Hospital Stay: Payer: 59 | Attending: Adult Health | Admitting: Adult Health

## 2024-04-10 ENCOUNTER — Other Ambulatory Visit: Payer: Self-pay | Admitting: Physician Assistant

## 2024-04-10 ENCOUNTER — Encounter: Payer: Self-pay | Admitting: Adult Health

## 2024-04-10 VITALS — BP 134/77 | HR 92 | Temp 98.0°F | Resp 16 | Wt 271.1 lb

## 2024-04-10 DIAGNOSIS — C50511 Malignant neoplasm of lower-outer quadrant of right female breast: Secondary | ICD-10-CM | POA: Diagnosis not present

## 2024-04-10 DIAGNOSIS — C50212 Malignant neoplasm of upper-inner quadrant of left female breast: Secondary | ICD-10-CM | POA: Diagnosis not present

## 2024-04-10 DIAGNOSIS — Z923 Personal history of irradiation: Secondary | ICD-10-CM | POA: Insufficient documentation

## 2024-04-10 DIAGNOSIS — R918 Other nonspecific abnormal finding of lung field: Secondary | ICD-10-CM | POA: Insufficient documentation

## 2024-04-10 DIAGNOSIS — Z6841 Body Mass Index (BMI) 40.0 and over, adult: Secondary | ICD-10-CM | POA: Diagnosis not present

## 2024-04-10 DIAGNOSIS — G4730 Sleep apnea, unspecified: Secondary | ICD-10-CM | POA: Insufficient documentation

## 2024-04-10 DIAGNOSIS — Z8616 Personal history of COVID-19: Secondary | ICD-10-CM | POA: Diagnosis not present

## 2024-04-10 DIAGNOSIS — Z91198 Patient's noncompliance with other medical treatment and regimen for other reason: Secondary | ICD-10-CM | POA: Diagnosis not present

## 2024-04-10 DIAGNOSIS — Z8042 Family history of malignant neoplasm of prostate: Secondary | ICD-10-CM | POA: Insufficient documentation

## 2024-04-10 DIAGNOSIS — Z170 Estrogen receptor positive status [ER+]: Secondary | ICD-10-CM | POA: Diagnosis not present

## 2024-04-10 DIAGNOSIS — Z853 Personal history of malignant neoplasm of breast: Secondary | ICD-10-CM | POA: Insufficient documentation

## 2024-04-10 DIAGNOSIS — E7800 Pure hypercholesterolemia, unspecified: Secondary | ICD-10-CM | POA: Insufficient documentation

## 2024-04-10 DIAGNOSIS — I429 Cardiomyopathy, unspecified: Secondary | ICD-10-CM | POA: Diagnosis not present

## 2024-04-10 DIAGNOSIS — D509 Iron deficiency anemia, unspecified: Secondary | ICD-10-CM | POA: Diagnosis not present

## 2024-04-10 DIAGNOSIS — F40240 Claustrophobia: Secondary | ICD-10-CM | POA: Diagnosis not present

## 2024-04-10 DIAGNOSIS — M170 Bilateral primary osteoarthritis of knee: Secondary | ICD-10-CM | POA: Diagnosis not present

## 2024-04-10 DIAGNOSIS — R350 Frequency of micturition: Secondary | ICD-10-CM | POA: Insufficient documentation

## 2024-04-10 DIAGNOSIS — E559 Vitamin D deficiency, unspecified: Secondary | ICD-10-CM | POA: Diagnosis not present

## 2024-04-10 DIAGNOSIS — Z86000 Personal history of in-situ neoplasm of breast: Secondary | ICD-10-CM | POA: Insufficient documentation

## 2024-04-10 DIAGNOSIS — M797 Fibromyalgia: Secondary | ICD-10-CM | POA: Insufficient documentation

## 2024-04-10 DIAGNOSIS — E785 Hyperlipidemia, unspecified: Secondary | ICD-10-CM | POA: Insufficient documentation

## 2024-04-10 DIAGNOSIS — I890 Lymphedema, not elsewhere classified: Secondary | ICD-10-CM | POA: Insufficient documentation

## 2024-04-10 DIAGNOSIS — Z87442 Personal history of urinary calculi: Secondary | ICD-10-CM | POA: Insufficient documentation

## 2024-04-10 DIAGNOSIS — I10 Essential (primary) hypertension: Secondary | ICD-10-CM | POA: Insufficient documentation

## 2024-04-10 DIAGNOSIS — E11620 Type 2 diabetes mellitus with diabetic dermatitis: Secondary | ICD-10-CM | POA: Diagnosis not present

## 2024-04-10 DIAGNOSIS — Z87891 Personal history of nicotine dependence: Secondary | ICD-10-CM | POA: Insufficient documentation

## 2024-04-10 DIAGNOSIS — L409 Psoriasis, unspecified: Secondary | ICD-10-CM | POA: Insufficient documentation

## 2024-04-10 DIAGNOSIS — K219 Gastro-esophageal reflux disease without esophagitis: Secondary | ICD-10-CM | POA: Diagnosis not present

## 2024-04-10 DIAGNOSIS — Z8711 Personal history of peptic ulcer disease: Secondary | ICD-10-CM | POA: Diagnosis not present

## 2024-04-10 DIAGNOSIS — M2550 Pain in unspecified joint: Secondary | ICD-10-CM

## 2024-04-10 DIAGNOSIS — Z803 Family history of malignant neoplasm of breast: Secondary | ICD-10-CM | POA: Insufficient documentation

## 2024-04-10 NOTE — Progress Notes (Signed)
 [facility] Cancer Follow up:    Ashley Speaks, FNP [address] [place] [address]   DIAGNOSIS: Cancer Staging  Breast cancer of upper-inner quadrant of left female breast [facility]) Staging form: Breast, AJCC 7th Edition - Clinical: T1a, N0, cM0 - Unsigned Laterality: Left - Pathologic: Stage IA (T1a, N0, cM0) - Signed by Odean Mackey POUR, MD on 02/11/2014 Laterality: Left    SUMMARY OF ONCOLOGIC HISTORY: Oncology History  Breast cancer of upper-inner quadrant of left female breast (HCC)  05/27/2007 Initial Diagnosis   Left breast biopsy: DCIS with calcifications ER/PR 100% positive, MRI revealed 1.7 x 1.4 x 0.9 cm DCIS at 12:00 position left breast   06/20/2007 Surgery   Left breast lumpectomy: T1 N0 M0 stage IA invasive ductal carcinoma 0.4-0.5 cm grade 1, ER 77%, PR 96%, Ki-67 less than 5%, HER-2 negative   08/21/2007 - 10/10/2007 Radiation Therapy   Adjuvant radiation therapy   01/14/2008 - 01/07/2017 Anti-estrogen oral therapy   Tamoxifen  20 mg daily   Breast cancer of lower-outer quadrant of right female breast (HCC)  05/26/2010 Initial Diagnosis   DCIS ER/PR positive   06/16/2010 Surgery   No residual DCIS, extensive fibrocystic changes with focal atypical ductal hyperplasia   08/02/2010 - 09/22/2010 Radiation Therapy   Adjuvant radiation   10/26/2010 - 10/31/2015 Anti-estrogen oral therapy   Tamoxifen  20 mg daily     CURRENT THERAPY: observation  INTERVAL HISTORY:  Discussed the use of AI scribe software for clinical note transcription with the patient, who gave verbal consent to proceed.  History of Present Illness Ashley Lindsey is a 63 year old female with a history of left breast invasive ductal carcinoma and right breast ductal carcinoma in situ, status post lumpectomy, adjuvant radiation, and tamoxifen , who presents for routine oncology surveillance.  She completed lumpectomy, adjuvant radiation, and tamoxifen  for left invasive  ductal carcinoma and right ductal carcinoma in situ, with tamoxifen  stopped in 2018, and is on observation. Her December 2024 mammogram showed no evidence of malignancy with breast density category C. She has no new breast symptoms, masses, or nipple discharge and does not perceive signs of recurrence.  She has chronic joint pain and soreness from fibromyalgia and arthritis, worse in cold weather, and intermittent breast soreness that she feels is distinct from her arthritis. She is being evaluated for possible autoimmune disease after positive ANA and elevated inflammatory markers, with rheumatology consultation pending. She has right arm lymphedema and is working to improve her sleep.  She takes venlafaxine , recently reduced from 150 mg to 75 mg daily without significant symptom change. She has intermittent dissociative sensations, memory difficulties, and persistent right-sided pain since a cerebrovascular event two years ago.  She wants to reestablish gynecologic care given her family history of cancer and prior cervical cancer and has not seen a gynecologist recently. She is mindful of her diet, including fruits and vegetables.     Patient Active Problem List   Diagnosis Date Noted   Polyarthralgia 04/07/2024   Mass of upper inner quadrant of right breast 04/05/2024   Morbid obesity with BMI of 45.0-49.9, adult (HCC) 11/20/2023   Psoriasis 11/20/2023   Noncompliance with CPAP treatment 11/05/2023   Rash 07/22/2023   Pelvic pain 07/22/2023   Mixed hyperlipidemia 03/20/2023   Encounter for annual health examination 03/14/2023   COVID-19 vaccination declined 03/14/2023   Class 3 severe obesity due to excess calories with serious comorbidity and body mass index (BMI) of 50.0 to 59.9 in  adult [facility]) 03/14/2023   Need for influenza vaccination 03/14/2023   Acute cough 02/20/2023   Right sided abdominal pain 02/19/2023   Hematuria 02/19/2023   Urinary tract infection 12/31/2022    Cervicogenic headache 12/31/2022   Urinary frequency 11/28/2022   Urinary tract infection without hematuria 11/28/2022   Elevated cholesterol 11/20/2022   Vitamin D  deficiency 11/20/2022   Tibialis posterior tendinitis 10/08/2022   OSA (obstructive sleep apnea) 09/04/2022   Claustrophobia 09/04/2022   Suboptimal nutrition 07/05/2022   Cardiomyopathy, unspecified type (HCC) 07/05/2022   Essential hypertension 07/05/2022   Type 2 diabetes mellitus with diabetic dermatitis, without long-term current use of insulin (HCC) 07/05/2022   Chronic gastric ulcer without hemorrhage and without perforation 04/30/2022   Gastric AVM 04/30/2022   Iron deficiency anemia 03/06/2022   Unilateral primary osteoarthritis, left knee 10/02/2021   Pulmonary nodules 07/12/2021   Chronic cough 07/12/2021   Avulsion fracture of middle phalanx of finger 04/18/2021   Tibial plateau fracture, left 04/18/2021   Asymmetrical sensorineural hearing loss 02/02/2021   Bilateral primary osteoarthritis of knee 02/09/2020   Chronic intermittent hypoxia with obstructive sleep apnea 08/24/2019   Chronic intractable headache 07/14/2019   Morning headache 07/14/2019   Loud snoring 07/14/2019   Super obesity 07/14/2019   Sleeps in sitting position due to orthopnea 07/14/2019   Chest pain due to GERD 07/14/2019   Nocturia more than twice per night 07/14/2019   Unilateral primary osteoarthritis, right knee 05/26/2019   Chronic migraine without aura without status migrainosus, not intractable 05/13/2019   Class 3 severe obesity due to excess calories without serious comorbidity with body mass index (BMI) of 40.0 to 44.9 in adult [facility]) 08/05/2018   Depression 08/05/2018   Chronic nonintractable headache 08/05/2018   Chronic pain of right ankle 04/24/2018   Breast cancer of lower-outer quadrant of right female breast (HCC) 12/02/2014   Bronchospasm 09/21/2014   Chronic pain of right knee 09/21/2014   Fibromyalgia 03/06/2013    Dense breasts 03/06/2013   Mastalgia 03/06/2013   Back pain, lumbosacral 03/06/2013   Lymphedema of arm 03/06/2013   Atypical chest pain 01/06/2013   Breast cancer of upper-inner quadrant of left female breast (HCC) 09/08/2012   Family history of malignant neoplasm of breast 09/08/2012   S/P radiation therapy    History of breast cancer in female 08/13/2011    is allergic to aspirin and penicillins.  MEDICAL HISTORY: Past Medical History:  Diagnosis Date   Abdominal cramps    Anxiety    Arthritis    breast cancer    s/p radiation- last dose 6/12//has been off Tamoxifen  8/12   Chronic headaches    Costochondritis    Dyspnea 09/21/2014   Fatigue    Fibromyalgia 03/06/2013   GERD (gastroesophageal reflux disease)    History of COVID-19    Hypertension    Lymphedema    RIGHT ARM-////  STATES USE LEFT ARM FOR BP'S   Migraines    Muscle spasms of head and/or neck    following to the breast   Passed out    4th of july   Personal history of radiation therapy 2010   lt breast    S/P radiation therapy 08/19/07 - 10/10/07   Left Breast/5040 cGy/28 fractions with Boost for a toatl dose of 6300 dGy   S/P radiation therapy 08/14/10 -10/03/10   right breast   Sleep apnea    STOP BANG SCORE 5    SURGICAL HISTORY: Past Surgical History:  Procedure Laterality  Date   ABDOMINAL HYSTERECTOMY     with left salpingooophorectomy   BIOPSY  04/30/2022   Procedure: BIOPSY;  Surgeon: Legrand Victory LITTIE DOUGLAS, MD;  Location: [facility];  Service: Gastroenterology;;   BREAST LUMPECTOMY  2009/2012   LEFT/RIGHT with lymph node dissection   COLONOSCOPY WITH PROPOFOL  N/A 04/30/2022   Procedure: COLONOSCOPY WITH PROPOFOL ;  Surgeon: Legrand Victory LITTIE DOUGLAS, MD;  Location: [facility];  Service: Gastroenterology;  Laterality: N/A;   ESOPHAGOGASTRODUODENOSCOPY (EGD) WITH PROPOFOL  N/A 04/30/2022   Procedure: ESOPHAGOGASTRODUODENOSCOPY (EGD) WITH PROPOFOL ;  Surgeon: Legrand Victory LITTIE DOUGLAS, MD;  Location: [facility];   Service: Gastroenterology;  Laterality: N/A;   HOT HEMOSTASIS N/A 04/30/2022   Procedure: HOT HEMOSTASIS (ARGON PLASMA COAGULATION/BICAP);  Surgeon: Legrand Victory LITTIE DOUGLAS, MD;  Location: [facility];  Service: Gastroenterology;  Laterality: N/A;   KNEE ARTHROSCOPY     right   OOPHORECTOMY  2013    SOCIAL HISTORY: Social History   Socioeconomic History   Marital status: Divorced    Spouse name: Not on file   Number of children: 1   Years of education: Not on file   Highest education level: Not on file  Occupational History   Occupation: disability  Tobacco Use   Smoking status: Former    Current packs/day: 0.25    Average packs/day: 0.3 packs/day for 1 year (0.3 ttl pk-yrs)    Types: Cigarettes   Smokeless tobacco: Never  Vaping Use   Vaping status: Never Used  Substance and Sexual Activity   Alcohol use: Not Currently   Drug use: No   Sexual activity: Not Currently    Birth control/protection: Surgical  Other Topics Concern   Not on file  Social History Narrative   Right handed    Coffee daily, sometimes Ginger ale    Social Drivers of Health   Tobacco Use: Medium Risk (04/10/2024)   Patient History    Smoking Tobacco Use: Former    Smokeless Tobacco Use: Never    Passive Exposure: Not on Actuary Strain: Low Risk (04/10/2024)   Overall Financial Resource Strain (CARDIA)    Difficulty of Paying Living Expenses: Not hard at all  Food Insecurity: No Food Insecurity (04/10/2024)   Epic    Worried About Radiation Protection Practitioner of Food in the Last Year: Never true    Ran Out of Food in the Last Year: Never true  Transportation Needs: No Transportation Needs (04/10/2024)   Epic    Lack of Transportation (Medical): No    Lack of Transportation (Non-Medical): No  Physical Activity: Inactive (04/03/2024)   Exercise Vital Sign    Days of Exercise per Week: 0 days    Minutes of Exercise per Session: 0 min  Stress: No Stress Concern Present (04/10/2024)   [employer]ational [employer] - Occupational Stress Questionnaire    Feeling of Stress: Not at all  Social Connections: Moderately Integrated (04/10/2024)   Social Connection and Isolation Panel    Frequency of Communication with Friends and Family: More than three times a week    Frequency of Social Gatherings with Friends and Family: More than three times a week    Attends Religious Services: More than 4 times per year    Active Member of [employer] or Organizations: Yes    Attends Banker Meetings: 1 to 4 times per year    Marital Status: Divorced  Intimate Partner Violence: Not At Risk (04/10/2024)   Epic    Fear of  Current or Ex-Partner: No    Emotionally Abused: No    Physically Abused: No    Sexually Abused: No  Depression (PHQ2-9): Low Risk (04/10/2024)   Depression (PHQ2-9)    PHQ-2 Score: 0  Alcohol Screen: Low Risk (04/10/2024)   Alcohol Screen    Last Alcohol Screening Score (AUDIT): 0  Housing: Unknown (04/10/2024)   Epic    Unable to Pay for Housing in the Last Year: No    Number of Times Moved in the Last Year: Not on file    Homeless in the Last Year: No  Utilities: Not At Risk (04/10/2024)   Epic    Threatened with loss of utilities: No  Health Literacy: Adequate Health Literacy (04/10/2024)   B1300 Health Literacy    Frequency of need for help with medical instructions: Never    FAMILY HISTORY: Family History  Problem Relation Age of Onset   Breast cancer Mother 16   Heart disease Father    Diabetes Sister    Prostate cancer Maternal Grandfather 71   Cancer Maternal Aunt 63       d. from female cancer possibly cervical   Breast cancer Maternal Aunt 60   Prostate cancer Maternal Uncle 78   Prostate cancer Maternal Uncle 80   Prostate cancer Paternal Uncle    Prostate cancer Paternal Uncle    Breast cancer Cousin 30       mat 1st cousin    Review of Systems  Constitutional:  Negative for appetite change, chills, fatigue, fever and  unexpected weight change.  HENT:   Negative for hearing loss, lump/mass and trouble swallowing.   Eyes:  Negative for eye problems and icterus.  Respiratory:  Negative for chest tightness, cough and shortness of breath.   Cardiovascular:  Negative for chest pain, leg swelling and palpitations.  Gastrointestinal:  Negative for abdominal distention, abdominal pain, constipation, diarrhea, nausea and vomiting.  Endocrine: Negative for hot flashes.  Genitourinary:  Negative for difficulty urinating.   Musculoskeletal:  Negative for arthralgias.  Skin:  Negative for itching and rash.  Neurological:  Negative for dizziness, extremity weakness, headaches and numbness.  Hematological:  Negative for adenopathy. Does not bruise/bleed easily.  Psychiatric/Behavioral:  Negative for depression. The patient is not nervous/anxious.       PHYSICAL EXAMINATION    Vitals:   04/10/24 1228  BP: 134/77  Pulse: 92  Resp: 16  Temp: 98 F (36.7 C)  SpO2: 94%    Physical Exam Constitutional:      General: She is not in acute distress.    Appearance: Normal appearance. She is not toxic-appearing.  HENT:     Head: Normocephalic and atraumatic.     Mouth/Throat:     Mouth: Mucous membranes are moist.     Pharynx: Oropharynx is clear. No oropharyngeal exudate or posterior oropharyngeal erythema.  Eyes:     General: No scleral icterus. Cardiovascular:     Rate and Rhythm: Normal rate and regular rhythm.     Pulses: Normal pulses.     Heart sounds: Normal heart sounds.  Pulmonary:     Effort: Pulmonary effort is normal.     Breath sounds: Normal breath sounds.  Chest:     Comments: Left breast s/p lumpectomy and radiation, no sign of local recurrence; right breast benign Abdominal:     General: Abdomen is flat. Bowel sounds are normal. There is no distension.     Palpations: Abdomen is soft.     Tenderness: There  is no abdominal tenderness.  Musculoskeletal:        General: No swelling.      Cervical back: Neck supple.  Lymphadenopathy:     Cervical: No cervical adenopathy.     Upper Body:     Right upper body: No supraclavicular or axillary adenopathy.     Left upper body: No supraclavicular or axillary adenopathy.  Skin:    General: Skin is warm and dry.     Findings: No rash.  Neurological:     General: No focal deficit present.     Mental Status: She is alert.  Psychiatric:        Mood and Affect: Mood normal.        Behavior: Behavior normal.     ASSESSMENT and THERAPY PLAN:   Assessment and Plan Assessment & Plan History of left breast invasive ductal carcinoma, status post lumpectomy, adjuvant radiation, and tamoxifen , in remission; History of right breast ductal carcinoma in situ, status post lumpectomy, adjuvant radiation, and tamoxifen , in remission Remains in remission with no clinical or radiographic evidence of recurrence. Most recent mammogram showed no malignancy. - Ordered screening mammogram at the breast center. - Scheduled follow-up in one year or earlier if indicated. - Recommended exercise and healthy diet  - Placed referral to gynecology today      All questions were answered. The patient knows to call the clinic with any problems, questions or concerns. We can certainly see the patient much sooner if necessary.  Total encounter time:30 minutes*in face-to-face visit time, chart review, lab review, care coordination, order entry, and documentation of the encounter time.    Morna Kendall, NP 04/10/2024 12:38 PM Medical Oncology and Hematology [facility] [address] [place], [place] [address] Tel. [phone]    Fax. [phone]  *Total Encounter Time as defined by the Centers for Medicare and Medicaid Services includes, in addition to the face-to-face time of a patient visit (documented in the note above) non-face-to-face time: obtaining and reviewing outside history, ordering and reviewing medications, tests or  procedures, care coordination (communications with other health care professionals or caregivers) and documentation in the medical record.

## 2024-04-18 ENCOUNTER — Other Ambulatory Visit: Payer: Self-pay | Admitting: Nurse Practitioner

## 2024-04-29 ENCOUNTER — Other Ambulatory Visit: Payer: Self-pay | Admitting: Physical Medicine and Rehabilitation

## 2024-05-07 ENCOUNTER — Other Ambulatory Visit: Payer: Self-pay | Admitting: Nurse Practitioner

## 2024-05-07 ENCOUNTER — Encounter: Payer: Self-pay | Admitting: Nurse Practitioner

## 2024-05-07 DIAGNOSIS — E782 Mixed hyperlipidemia: Secondary | ICD-10-CM

## 2024-05-07 MED ORDER — ROSUVASTATIN CALCIUM 5 MG PO TABS: 5.0000 mg | ORAL_TABLET | Freq: Every day | ORAL | 1 refills | Status: AC

## 2024-05-09 ENCOUNTER — Other Ambulatory Visit: Payer: Self-pay | Admitting: Physical Medicine and Rehabilitation

## 2024-05-13 NOTE — Assessment & Plan Note (Signed)
 Ashley Lindsey

## 2024-05-13 NOTE — Assessment & Plan Note (Signed)
 SABRA

## 2024-05-13 NOTE — Progress Notes (Unsigned)
 "   Consult Note  Patient: Ashley Lindsey             Date of Birth: August 19, 1960           MRN: [id number]             Visit Date: 05/14/2024  Referring Provider: Georgina Speaks, FNP  Thank you for entrusting me in the care of this patient! Below are my findings:  Subjective:   Chief Complaint: No chief complaint on file.  Discussed the use of AI scribe software for clinical note transcription with the patient, who gave verbal consent to proceed.  History of Present Illness: Ashley Lindsey is a 64 y.o. female *** who presents to the clinic today for evaluation of ***. She has a PMH of ***.  History of Present Illness   Activities of Daily Living:  Patient reports morning stiffness for *** {minute/hour:19697}.   Patient {ACTIONS;DENIES/REPORTS:21021675::Denies} nocturnal pain.  Difficulty dressing/grooming: {ACTIONS;DENIES/REPORTS:21021675::Denies} Difficulty climbing stairs: {ACTIONS;DENIES/REPORTS:21021675::Denies} Difficulty getting out of chair: {ACTIONS;DENIES/REPORTS:21021675::Denies} Difficulty using hands for taps, buttons, cutlery, and/or writing: {ACTIONS;DENIES/REPORTS:21021675::Denies}  Labs From Referral: 04/07/2024 - ANA 1:80 cytoplasmic, CRP 12.6, ESR 31,  CBC and CMP WNL, RF neg  Review of Systems: No Rheumatology ROS completed.   Medication List: Current Medications[1]   Allergies:  Aspirin and Penicillins  Immunization status:  Immunization History  Administered Date(s) Administered   INFLUENZA, HIGH DOSE SEASONAL PF 03/26/2024   Influenza Inj Mdck Quad Pf 05/20/2017   Influenza Split 04/21/2012   Influenza, Seasonal, Injecte, Preservative Fre 03/14/2023   Influenza,inj,Quad PF,6+ Mos 05/19/2013, 05/16/2015, 01/26/2016, 02/27/2018, 01/20/2019, 02/03/2020, 02/08/2021, 02/08/2022   Influenza-Unspecified 02/24/2018   PFIZER(Purple Top)SARS-COV-2 Vaccination 07/23/2019, 08/17/2019, 04/04/2020   Pneumococcal Polysaccharide-23 02/08/2021   Tdap  11/05/2012, 11/20/2022   Zoster Recombinant(Shingrix ) 10/16/2021, 03/05/2022     Problem List:  Patient Active Problem List   Diagnosis Date Noted   Polyarthralgia 04/07/2024   Mass of upper inner quadrant of right breast 04/05/2024   Morbid obesity with BMI of 45.0-49.9, adult (HCC) 11/20/2023   Psoriasis 11/20/2023   Noncompliance with CPAP treatment 11/05/2023   Rash 07/22/2023   Pelvic pain 07/22/2023   Mixed hyperlipidemia 03/20/2023   Encounter for annual health examination 03/14/2023   COVID-19 vaccination declined 03/14/2023   Class 3 severe obesity due to excess calories with serious comorbidity and body mass index (BMI) of 50.0 to 59.9 in adult [facility]) 03/14/2023   Need for influenza vaccination 03/14/2023   Acute cough 02/20/2023   Right sided abdominal pain 02/19/2023   Hematuria 02/19/2023   Urinary tract infection 12/31/2022   Cervicogenic headache 12/31/2022   Urinary frequency 11/28/2022   Urinary tract infection without hematuria 11/28/2022   Elevated cholesterol 11/20/2022   Vitamin D  deficiency 11/20/2022   Tibialis posterior tendinitis 10/08/2022   OSA (obstructive sleep apnea) 09/04/2022   Claustrophobia 09/04/2022   Suboptimal nutrition 07/05/2022   Cardiomyopathy, unspecified type (HCC) 07/05/2022   Essential hypertension 07/05/2022   Type 2 diabetes mellitus with diabetic dermatitis, without long-term current use of insulin (HCC) 07/05/2022   Chronic gastric ulcer without hemorrhage and without perforation 04/30/2022   Gastric AVM 04/30/2022   Iron deficiency anemia 03/06/2022   Unilateral primary osteoarthritis, left knee 10/02/2021   Pulmonary nodules 07/12/2021   Chronic cough 07/12/2021   Avulsion fracture of middle phalanx of finger 04/18/2021   Tibial plateau fracture, left 04/18/2021   Asymmetrical sensorineural hearing loss 02/02/2021   Bilateral primary osteoarthritis of knee 02/09/2020   Chronic intermittent  hypoxia with obstructive sleep  apnea 08/24/2019   Chronic intractable headache 07/14/2019   Morning headache 07/14/2019   Loud snoring 07/14/2019   Super obesity 07/14/2019   Sleeps in sitting position due to orthopnea 07/14/2019   Chest pain due to GERD 07/14/2019   Nocturia more than twice per night 07/14/2019   Unilateral primary osteoarthritis, right knee 05/26/2019   Chronic migraine without aura without status migrainosus, not intractable 05/13/2019   Class 3 severe obesity due to excess calories without serious comorbidity with body mass index (BMI) of 40.0 to 44.9 in adult [facility]) 08/05/2018   Depression 08/05/2018   Chronic nonintractable headache 08/05/2018   Chronic pain of right ankle 04/24/2018   Breast cancer of lower-outer quadrant of right female breast (HCC) 12/02/2014   Bronchospasm 09/21/2014   Chronic pain of right knee 09/21/2014   Fibromyalgia 03/06/2013   Dense breasts 03/06/2013   Mastalgia 03/06/2013   Back pain, lumbosacral 03/06/2013   Lymphedema of arm 03/06/2013   Atypical chest pain 01/06/2013   Breast cancer of upper-inner quadrant of left female breast (HCC) 09/08/2012   Family history of malignant neoplasm of breast 09/08/2012   S/P radiation therapy    History of breast cancer in female 08/13/2011    History: Past Medical History:  Diagnosis Date   Abdominal cramps    Anxiety    Arthritis    breast cancer    s/p radiation- last dose 6/12//has been off Tamoxifen  8/12   Chronic headaches    Costochondritis    Dyspnea 09/21/2014   Fatigue    Fibromyalgia 03/06/2013   GERD (gastroesophageal reflux disease)    History of COVID-19    Hypertension    Lymphedema    RIGHT ARM-////  STATES USE LEFT ARM FOR BP'S   Migraines    Muscle spasms of head and/or neck    following to the breast   Passed out    4th of july   Personal history of radiation therapy 2010   lt breast    S/P radiation therapy 08/19/07 - 10/10/07   Left Breast/5040 cGy/28 fractions with Boost for a toatl  dose of 6300 dGy   S/P radiation therapy 08/14/10 -10/03/10   right breast   Sleep apnea    STOP BANG SCORE 5    Family History  Problem Relation Age of Onset   Breast cancer Mother 13   Heart disease Father    Diabetes Sister    Prostate cancer Maternal Grandfather 19   Cancer Maternal Aunt 63       d. from female cancer possibly cervical   Breast cancer Maternal Aunt 60   Prostate cancer Maternal Uncle 78   Prostate cancer Maternal Uncle 80   Prostate cancer Paternal Uncle    Prostate cancer Paternal Uncle    Breast cancer Cousin 30       mat 1st cousin   Past Surgical History:  Procedure Laterality Date   ABDOMINAL HYSTERECTOMY     with left salpingooophorectomy   BIOPSY  04/30/2022   Procedure: BIOPSY;  Surgeon: Legrand Victory LITTIE DOUGLAS, MD;  Location: [facility];  Service: Gastroenterology;;   BREAST LUMPECTOMY  2009/2012   LEFT/RIGHT with lymph node dissection   COLONOSCOPY WITH PROPOFOL  N/A 04/30/2022   Procedure: COLONOSCOPY WITH PROPOFOL ;  Surgeon: Legrand Victory LITTIE DOUGLAS, MD;  Location: [facility];  Service: Gastroenterology;  Laterality: N/A;   ESOPHAGOGASTRODUODENOSCOPY (EGD) WITH PROPOFOL  N/A 04/30/2022   Procedure: ESOPHAGOGASTRODUODENOSCOPY (EGD) WITH PROPOFOL ;  Surgeon: Legrand,  Victory LITTIE MOULD, MD;  Location: [facility];  Service: Gastroenterology;  Laterality: N/A;   HOT HEMOSTASIS N/A 04/30/2022   Procedure: HOT HEMOSTASIS (ARGON PLASMA COAGULATION/BICAP);  Surgeon: Legrand Victory LITTIE MOULD, MD;  Location: [facility];  Service: Gastroenterology;  Laterality: N/A;   KNEE ARTHROSCOPY     right   OOPHORECTOMY  2013   Social History   Social History Narrative   Right handed    Coffee daily, sometimes Ginger ale     Objective: Vital Signs: There were no vitals taken for this visit.  Physical Exam   Imaging: No results found.  Assessment & Plan Polyarthralgia     Bilateral primary osteoarthritis of knee     Psoriasis      Follow-Up Instructions:  No  follow-ups on file.   Procedures: No procedures performed  Daved GORMAN Holstein, PA-C  Note - This record has been created using [employer]. Chart creation errors have been sought, but may not always have been located. Such creation errors do not reflect on the standard of medical care.     [1]  Current Outpatient Medications:    acetaminophen  (TYLENOL ) 650 MG CR tablet, Take 650 mg by mouth every 8 (eight) hours as needed for pain., Disp: , Rfl:    albuterol  (VENTOLIN  HFA) 108 (90 Base) MCG/ACT inhaler, Inhale 2 puffs into the lungs every 4 (four) hours as needed for wheezing or shortness of breath., Disp: 1 each, Rfl: 0   Bisacodyl (DULCOLAX PO), Take 1 capsule by mouth daily as needed (constipation). , Disp: , Rfl:    cholecalciferol  5000 units TABS, Take 1 tablet (5,000 Units total) by mouth 2 (two) times daily. (Patient taking differently: Take 4,000 Units by mouth daily.), Disp: , Rfl:    cyclobenzaprine  (FLEXERIL ) 10 MG tablet, TAKE 1 TABLET BY MOUTH EVERY DAY AS NEEDED, Disp: 30 tablet, Rfl: 1   dexlansoprazole  (DEXILANT ) 60 MG capsule, TAKE 1 CAPSULE BY MOUTH EVERY DAY, Disp: 90 capsule, Rfl: 1   diclofenac  Sodium (VOLTAREN  ARTHRITIS PAIN) 1 % GEL, Apply 2 g topically 4 (four) times daily., Disp: 100 g, Rfl: 1   diphenhydrAMINE  (BENADRYL ) 25 MG tablet, Take 1 tablet (25 mg total) by mouth every 6 (six) hours as needed for up to 20 doses (migraine). Take with reglan  for migraine headache, Disp: 20 tablet, Rfl: 0   ELDERBERRY PO, Take 1 tablet by mouth daily., Disp: , Rfl:    ferrous sulfate 324 (65 Fe) MG TBEC, Take 325 mg by mouth 2 (two) times daily., Disp: , Rfl:    fish oil-omega-3 fatty acids  1000 MG capsule, Take 1 g by mouth daily., Disp: , Rfl:    Flaxseed, Linseed, (FLAXSEED OIL MAX STR PO), Take 1 capsule by mouth daily. , Disp: , Rfl:    furosemide  (LASIX ) 20 MG tablet, TAKE 1 TABLET BY MOUTH EVERY DAY AS NEEDED, Disp: 90 tablet, Rfl: 1   gabapentin  (NEURONTIN ) 300 MG  capsule, TAKE 1 CAPSULE BY MOUTH THREE TIMES A DAY, Disp: 90 capsule, Rfl: 3   gabapentin  (NEURONTIN ) 600 MG tablet, TAKE 1 TABLET (600 MG TOTAL) BY MOUTH IN THE MORNING, AT NOON, IN THE EVENING, AND AT BEDTIME., Disp: 120 tablet, Rfl: 3   meloxicam  (MOBIC ) 7.5 MG tablet, TAKE 1 TABLET BY MOUTH EVERY DAY, Disp: 90 tablet, Rfl: 1   Multiple Vitamins-Minerals (MULTIVITAMIN & MINERAL PO), Take 1 tablet by mouth daily., Disp: , Rfl:    mupirocin  ointment (BACTROBAN ) 2 %, Apply 1 Application topically 2 (  two) times daily. (Patient taking differently: Apply 1 Application topically as needed.), Disp: 22 g, Rfl: 0   ondansetron  (ZOFRAN ) 4 MG tablet, TAKE 1 TABLET BY MOUTH DAILY AS NEEDED FOR NAUSEA OR VOMITING, Disp: 30 tablet, Rfl: 1   OVER THE COUNTER MEDICATION, Take 125 mg by mouth daily. [place]/Simethicone, Disp: , Rfl:    polyethylene glycol (MIRALAX / GLYCOLAX) 17 g packet, Take 17 g by mouth daily as needed., Disp: , Rfl:    propranolol  (INDERAL ) 10 MG tablet, Take 1 tablet (10 mg total) by mouth 2 (two) times daily. *HOLD IF SYSTOLIC BP (TOP #) IS <100MMHG OR PULSE IS <60BPM, Disp: 180 tablet, Rfl: 3   raNITIdine HCl (RANITIDINE 75 PO), Take 75 mg by mouth daily as needed., Disp: , Rfl:    rosuvastatin  (CRESTOR ) 5 MG tablet, Take 1 tablet (5 mg total) by mouth daily., Disp: 90 tablet, Rfl: 1   sacubitril-valsartan (ENTRESTO ) 24-26 MG, Take 1 tablet by mouth 2 (two) times daily., Disp: 180 tablet, Rfl: 3   Semaglutide , 2 MG/DOSE, (OZEMPIC , 2 MG/DOSE,) 8 MG/3ML SOPN, Inject 2 mg into the skin once a week., Disp: 9 mL, Rfl: 2   spironolactone  (ALDACTONE ) 25 MG tablet, TAKE 1 TABLET BY MOUTH EVERY DAY IN THE MORNING, Disp: 90 tablet, Rfl: 2   Suzetrigine  (JOURNAVX ) 50 MG TABS, Take 1 tablet by mouth 2 (two) times daily as needed., Disp: 60 tablet, Rfl: 0   SYMBICORT  80-4.5 MCG/ACT inhaler, INHALE 2 PUFFS INTO THE LUNGS IN THE MORNING AND AT BEDTIME. (Patient taking differently: Inhale 2 puffs into the  lungs 2 (two) times daily as needed.), Disp: 30.6 each, Rfl: 1   topiramate  (TOPAMAX ) 25 MG tablet, TAKE 1 TABLET (25 MG TOTAL) BY MOUTH DAILY AS NEEDED (BREAKTHROUGH PAIN)., Disp: 90 tablet, Rfl: 3   triamcinolone  cream (KENALOG ) 0.1 %, APPLY TO AFFECTED AREA (RASH) TWICE A DAY AS NEEDED, Disp: 60 g, Rfl: 1   Turmeric 500 MG CAPS, Take by mouth., Disp: , Rfl:    venlafaxine  XR (EFFEXOR -XR) 75 MG 24 hr capsule, Take 1 capsule (75 mg total) by mouth in the morning and at bedtime., Disp: 180 capsule, Rfl: 1   vitamin E 200 UNIT capsule, Take 200 Units by mouth daily., Disp: , Rfl:   "

## 2024-05-14 ENCOUNTER — Encounter: Admitting: Physical Medicine and Rehabilitation

## 2024-05-14 ENCOUNTER — Ambulatory Visit

## 2024-05-14 VITALS — BP 118/80 | HR 81 | Temp 98.0°F | Resp 14 | Ht 62.0 in | Wt 268.4 lb

## 2024-05-14 DIAGNOSIS — L409 Psoriasis, unspecified: Secondary | ICD-10-CM

## 2024-05-14 DIAGNOSIS — R7689 Other specified abnormal immunological findings in serum: Secondary | ICD-10-CM | POA: Diagnosis not present

## 2024-05-14 DIAGNOSIS — M170 Bilateral primary osteoarthritis of knee: Secondary | ICD-10-CM

## 2024-05-14 DIAGNOSIS — M2550 Pain in unspecified joint: Secondary | ICD-10-CM | POA: Diagnosis not present

## 2024-05-14 DIAGNOSIS — M797 Fibromyalgia: Secondary | ICD-10-CM | POA: Diagnosis not present

## 2024-05-14 NOTE — Assessment & Plan Note (Addendum)
 Chronic but currently stable. Managed with gabapentin . Continue current regimen.

## 2024-05-19 ENCOUNTER — Other Ambulatory Visit: Payer: Self-pay

## 2024-05-19 DIAGNOSIS — M797 Fibromyalgia: Secondary | ICD-10-CM

## 2024-05-19 LAB — COMPREHENSIVE METABOLIC PANEL WITH GFR
AG Ratio: 1.6 (calc) (ref 1.0–2.5)
ALT: 12 U/L (ref 6–29)
AST: 15 U/L (ref 10–35)
Albumin: 4.3 g/dL (ref 3.6–5.1)
Alkaline phosphatase (APISO): 68 U/L (ref 37–153)
BUN: 10 mg/dL (ref 7–25)
CO2: 29 mmol/L (ref 20–32)
Calcium: 9.3 mg/dL (ref 8.6–10.4)
Chloride: 101 mmol/L (ref 98–110)
Creat: 0.64 mg/dL (ref 0.50–1.05)
Globulin: 2.7 g/dL (ref 1.9–3.7)
Glucose, Bld: 79 mg/dL (ref 65–99)
Potassium: 4.6 mmol/L (ref 3.5–5.3)
Sodium: 139 mmol/L (ref 135–146)
Total Bilirubin: 0.4 mg/dL (ref 0.2–1.2)
Total Protein: 7 g/dL (ref 6.1–8.1)
eGFR: 98 mL/min/{1.73_m2}

## 2024-05-19 LAB — CBC WITH DIFFERENTIAL/PLATELET
Absolute Lymphocytes: 2442 {cells}/uL (ref 850–3900)
Absolute Monocytes: 391 {cells}/uL (ref 200–950)
Basophils Absolute: 21 {cells}/uL (ref 0–200)
Basophils Relative: 0.3 %
Eosinophils Absolute: 78 {cells}/uL (ref 15–500)
Eosinophils Relative: 1.1 %
HCT: 39.4 % (ref 35.9–46.0)
Hemoglobin: 12.5 g/dL (ref 11.7–15.5)
MCH: 26.2 pg — ABNORMAL LOW (ref 27.0–33.0)
MCHC: 31.7 g/dL (ref 31.6–35.4)
MCV: 82.6 fL (ref 81.4–101.7)
MPV: 9.8 fL (ref 7.5–12.5)
Monocytes Relative: 5.5 %
Neutro Abs: 4168 {cells}/uL (ref 1500–7800)
Neutrophils Relative %: 58.7 %
Platelets: 322 10*3/uL (ref 140–400)
RBC: 4.77 Million/uL (ref 3.80–5.10)
RDW: 13.9 % (ref 11.0–15.0)
Total Lymphocyte: 34.4 %
WBC: 7.1 10*3/uL (ref 3.8–10.8)

## 2024-05-19 LAB — C3 AND C4
C3 Complement: 190 mg/dL (ref 83–193)
C4 Complement: 41 mg/dL (ref 15–57)

## 2024-05-19 LAB — ANTI-DNA ANTIBODY, DOUBLE-STRANDED: ds DNA Ab: <1 [IU]/mL

## 2024-05-19 LAB — VITAMIN D 25 HYDROXY (VIT D DEFICIENCY, FRACTURES): Vit D, 25-Hydroxy: 43 ng/mL (ref 30–100)

## 2024-05-19 LAB — CYCLIC CITRUL PEPTIDE ANTIBODY, IGG: Cyclic Citrullin Peptide Ab: <16 U

## 2024-05-19 LAB — SEDIMENTATION RATE: Sed Rate: 22 mm/h (ref 0–30)

## 2024-05-19 LAB — RNP ANTIBODY: Ribonucleic Protein(ENA) Antibody, IgG: <1 AI

## 2024-05-20 ENCOUNTER — Ambulatory Visit: Payer: Self-pay

## 2024-05-20 MED ORDER — GABAPENTIN 600 MG PO TABS: 600.0000 mg | ORAL_TABLET | Freq: Four times a day (QID) | ORAL | 3 refills | Status: AC

## 2024-05-20 NOTE — Telephone Encounter (Signed)
 Pharmacy Gabapentin  refill request:   Patient need a refill of Gabapentin  600 mg. Please send to [facility] on [address]. Last office visit 01/19/2024.

## 2024-05-21 ENCOUNTER — Telehealth: Payer: Self-pay

## 2024-05-21 NOTE — Telephone Encounter (Signed)
 Specialist office visits, [employer] [id number] and Administration [phone] are covered at 80% of the allowable. Deductible must be met before coverage applies. Coverage will go to 100% once the out-of-pocket (OOP) maximum is met for all covered healthcare services. As of January 20, the patient has $9213.46 remaining toward their OOP maximum. Patient has Medicaid as secondary insurance. Members responsibilities left over by Medicare can be billed to Medicaid (If eligible). Member will be covered at 100% of the allowable amount. Pre-certification is not required to be submitted with the claim. Medical notes will be requested if needed. A referral is required from PCP, Dr. Catheryn Slocumb Phone # [phone]. This must be submitted with the claim.

## 2024-05-26 ENCOUNTER — Encounter: Admitting: Physical Medicine and Rehabilitation

## 2024-06-11 ENCOUNTER — Ambulatory Visit

## 2024-07-28 ENCOUNTER — Ambulatory Visit: Payer: Self-pay | Admitting: Nurse Practitioner

## 2024-08-04 ENCOUNTER — Encounter: Admitting: Physical Medicine and Rehabilitation

## 2025-03-30 ENCOUNTER — Encounter: Payer: Self-pay | Admitting: Nurse Practitioner

## 2025-04-09 ENCOUNTER — Inpatient Hospital Stay: Admitting: Adult Health
